# Patient Record
Sex: Female | Born: 1950 | Race: White | Hispanic: No | Marital: Married | Smoking: Never smoker
Health system: Southern US, Community
[De-identification: ages and names within clinical notes are randomized; demographics above are authoritative.]

## PROBLEM LIST (undated history)

## (undated) DIAGNOSIS — E119 Type 2 diabetes mellitus without complications: Secondary | ICD-10-CM

## (undated) DIAGNOSIS — R42 Dizziness and giddiness: Secondary | ICD-10-CM

## (undated) DIAGNOSIS — R519 Headache, unspecified: Secondary | ICD-10-CM

## (undated) DIAGNOSIS — M545 Low back pain, unspecified: Secondary | ICD-10-CM

## (undated) DIAGNOSIS — Z923 Personal history of irradiation: Secondary | ICD-10-CM

## (undated) DIAGNOSIS — Z9889 Other specified postprocedural states: Secondary | ICD-10-CM

## (undated) DIAGNOSIS — R51 Headache: Secondary | ICD-10-CM

## (undated) DIAGNOSIS — E785 Hyperlipidemia, unspecified: Secondary | ICD-10-CM

## (undated) DIAGNOSIS — C50919 Malignant neoplasm of unspecified site of unspecified female breast: Secondary | ICD-10-CM

## (undated) DIAGNOSIS — K219 Gastro-esophageal reflux disease without esophagitis: Secondary | ICD-10-CM

## (undated) DIAGNOSIS — R112 Nausea with vomiting, unspecified: Secondary | ICD-10-CM

## (undated) DIAGNOSIS — I10 Essential (primary) hypertension: Secondary | ICD-10-CM

## (undated) HISTORY — DX: Low back pain: M54.5

## (undated) HISTORY — DX: Low back pain, unspecified: M54.50

## (undated) HISTORY — DX: Dizziness and giddiness: R42

## (undated) HISTORY — DX: Essential (primary) hypertension: I10

## (undated) HISTORY — PX: CATARACT EXTRACTION: SUR2

---

## 2011-10-31 ENCOUNTER — Emergency Department: Payer: Self-pay | Admitting: Unknown Physician Specialty

## 2012-02-05 DIAGNOSIS — I10 Essential (primary) hypertension: Secondary | ICD-10-CM

## 2012-02-05 HISTORY — DX: Essential (primary) hypertension: I10

## 2013-02-04 HISTORY — PX: BREAST LUMPECTOMY: SHX2

## 2013-08-10 ENCOUNTER — Emergency Department (HOSPITAL_COMMUNITY)
Admission: EM | Admit: 2013-08-10 | Discharge: 2013-08-10 | Disposition: A | Discharge status: Home / Self Care | Payer: Self-pay | Attending: Emergency Medicine | Admitting: Emergency Medicine

## 2013-08-10 ENCOUNTER — Emergency Department (HOSPITAL_COMMUNITY): Payer: Self-pay

## 2013-08-10 ENCOUNTER — Encounter (HOSPITAL_COMMUNITY): Payer: Self-pay | Admitting: Emergency Medicine

## 2013-08-10 DIAGNOSIS — N859 Noninflammatory disorder of uterus, unspecified: Secondary | ICD-10-CM | POA: Insufficient documentation

## 2013-08-10 DIAGNOSIS — N949 Unspecified condition associated with female genital organs and menstrual cycle: Secondary | ICD-10-CM

## 2013-08-10 DIAGNOSIS — R1032 Left lower quadrant pain: Secondary | ICD-10-CM

## 2013-08-10 LAB — URINALYSIS, ROUTINE W REFLEX MICROSCOPIC
Bilirubin Urine: NEGATIVE
Glucose, UA: NEGATIVE mg/dL
Hgb urine dipstick: NEGATIVE
Ketones, ur: 40 mg/dL — AB
Nitrite: NEGATIVE
Protein, ur: NEGATIVE mg/dL
Specific Gravity, Urine: 1.023 (ref 1.005–1.030)
Urobilinogen, UA: 1 mg/dL (ref 0.0–1.0)
pH: 6.5 (ref 5.0–8.0)

## 2013-08-10 LAB — COMPREHENSIVE METABOLIC PANEL
ALT: 55 U/L — ABNORMAL HIGH (ref 0–35)
AST: 48 U/L — ABNORMAL HIGH (ref 0–37)
Albumin: 3.8 g/dL (ref 3.5–5.2)
Alkaline Phosphatase: 64 U/L (ref 39–117)
Anion gap: 18 — ABNORMAL HIGH (ref 5–15)
BUN: 16 mg/dL (ref 6–23)
CO2: 26 mEq/L (ref 19–32)
Calcium: 9.4 mg/dL (ref 8.4–10.5)
Chloride: 97 mEq/L (ref 96–112)
Creatinine, Ser: 0.75 mg/dL (ref 0.50–1.10)
GFR calc Af Amer: >90 mL/min (ref >90)
GFR calc non Af Amer: 88 mL/min — ABNORMAL LOW (ref >90)
Glucose, Bld: 179 mg/dL — ABNORMAL HIGH (ref 70–99)
Potassium: 3.8 mEq/L (ref 3.7–5.3)
Sodium: 141 mEq/L (ref 137–147)
Total Bilirubin: 0.4 mg/dL (ref 0.3–1.2)
Total Protein: 8 g/dL (ref 6.0–8.3)

## 2013-08-10 LAB — CBC WITH DIFFERENTIAL/PLATELET
Basophils Absolute: 0 10*3/uL (ref 0.0–0.1)
Basophils Relative: 0 % (ref 0–1)
Eosinophils Absolute: 0.1 10*3/uL (ref 0.0–0.7)
Eosinophils Relative: 1 % (ref 0–5)
HCT: 43.7 % (ref 36.0–46.0)
Hemoglobin: 14.3 g/dL (ref 12.0–15.0)
Lymphocytes Relative: 19 % (ref 12–46)
Lymphs Abs: 1.9 10*3/uL (ref 0.7–4.0)
MCH: 29.3 pg (ref 26.0–34.0)
MCHC: 32.7 g/dL (ref 30.0–36.0)
MCV: 89.5 fL (ref 78.0–100.0)
Monocytes Absolute: 0.5 10*3/uL (ref 0.1–1.0)
Monocytes Relative: 5 % (ref 3–12)
Neutro Abs: 7.7 10*3/uL (ref 1.7–7.7)
Neutrophils Relative %: 75 % (ref 43–77)
Platelets: 267 10*3/uL (ref 150–400)
RBC: 4.88 MIL/uL (ref 3.87–5.11)
RDW: 13.8 % (ref 11.5–15.5)
WBC: 10.3 10*3/uL (ref 4.0–10.5)

## 2013-08-10 LAB — URINE MICROSCOPIC-ADD ON

## 2013-08-10 LAB — LIPASE, BLOOD: Lipase: 33 U/L (ref 11–59)

## 2013-08-10 MED ORDER — MORPHINE SULFATE 4 MG/ML IJ SOLN
4.0000 mg | Freq: Once | INTRAMUSCULAR | Status: AC
  Administered 2013-08-10: 4 mg via INTRAVENOUS
  Filled 2013-08-10: qty 1

## 2013-08-10 MED ORDER — HYDROMORPHONE HCL PF 1 MG/ML IJ SOLN
1.0000 mg | Freq: Once | INTRAMUSCULAR | Status: DC
  Filled 2013-08-10: qty 1

## 2013-08-10 MED ORDER — IOHEXOL 300 MG/ML  SOLN
100.0000 mL | Freq: Once | INTRAMUSCULAR | Status: AC | PRN
  Administered 2013-08-10: 100 mL via INTRAVENOUS

## 2013-08-10 MED ORDER — IOHEXOL 300 MG/ML  SOLN
25.0000 mL | INTRAMUSCULAR | Status: AC
  Administered 2013-08-10: 25 mL via ORAL

## 2013-08-10 MED ORDER — HYDROMORPHONE HCL PF 1 MG/ML IJ SOLN
2.0000 mg | Freq: Once | INTRAMUSCULAR | Status: AC
  Administered 2013-08-10: 2 mg via INTRAVENOUS
  Filled 2013-08-10: qty 2

## 2013-08-10 MED ORDER — ONDANSETRON HCL 4 MG PO TABS: 4.0000 mg | ORAL_TABLET | Freq: Three times a day (TID) | ORAL | Status: DC | PRN

## 2013-08-10 MED ORDER — IBUPROFEN 800 MG PO TABS: 800.0000 mg | ORAL_TABLET | Freq: Three times a day (TID) | ORAL | Status: DC

## 2013-08-10 MED ORDER — ONDANSETRON 4 MG PO TBDP
8.0000 mg | ORAL_TABLET | Freq: Once | ORAL | Status: AC
  Administered 2013-08-10: 8 mg via ORAL
  Filled 2013-08-10: qty 2

## 2013-08-10 MED ORDER — ONDANSETRON HCL 4 MG/2ML IJ SOLN
4.0000 mg | Freq: Once | INTRAMUSCULAR | Status: AC
  Administered 2013-08-10: 4 mg via INTRAVENOUS
  Filled 2013-08-10: qty 2

## 2013-08-10 MED ORDER — SODIUM CHLORIDE 0.9 % IV BOLUS (SEPSIS)
1000.0000 mL | Freq: Once | INTRAVENOUS | Status: AC
  Administered 2013-08-10: 1000 mL via INTRAVENOUS

## 2013-08-10 MED ORDER — OXYCODONE-ACETAMINOPHEN 5-325 MG PO TABS: 1.0000 | ORAL_TABLET | Freq: Four times a day (QID) | ORAL | Status: DC | PRN

## 2013-08-10 NOTE — ED Notes (Signed)
Pt presents to department for evaluation of lower abdominal pain. Onset this morning. 10/10 pain upon arrival to ED. Also states nausea/vomiting. Denies urinary symptoms. Last BM this morning was normal. States she had same pain several weeks ago, but subsided.

## 2013-08-10 NOTE — ED Notes (Signed)
Pt has returned from radiology.  

## 2013-08-10 NOTE — ED Notes (Signed)
Pt states that she would like more pain medication.

## 2013-08-10 NOTE — ED Provider Notes (Signed)
CSN: [id number]     Arrival date & time 08/10/13  1336 History   First MD Initiated Contact with Patient 08/10/13 1523     Chief Complaint  Patient presents with  . Abdominal Pain     (Consider location/radiation/quality/duration/timing/severity/associated sxs/prior Treatment) HPI Comments: Patient is a 63 year old female past medical history significant for reflux presenting to the emergency department for acute onset lower abdominal pain. Patient describes her pain as sharp 10 out of 10 with no alleviating factors. She states the left lower quadrant is the greatest source of pain. She endorses nausea with a few episodes of nonbloody nonbilious emesis earlier today. She states she's had a normal nonbloody non-melanic bowel movement this morning. Denies any fevers, urinary symptoms, vaginal bleeding or discharge, diarrhea or constipation. No abdominal surgical history.  Patient is a 63 y.o. female presenting with abdominal pain.  Abdominal Pain Associated symptoms: chills, nausea and vomiting   Associated symptoms: no chest pain, no constipation, no diarrhea, no fever and no shortness of breath     History reviewed. No pertinent past medical history. History reviewed. No pertinent past surgical history. No family history on file. History  Substance Use Topics  . Smoking status: Never Smoker   . Smokeless tobacco: Not on file  . Alcohol Use: No   OB History   Grav Para Term Preterm Abortions TAB SAB Ect Mult Living                 Review of Systems  Constitutional: Positive for chills. Negative for fever.  Respiratory: Negative for shortness of breath.   Cardiovascular: Negative for chest pain.  Gastrointestinal: Positive for nausea, vomiting and abdominal pain. Negative for diarrhea, constipation, blood in stool and anal bleeding.  All other systems reviewed and are negative.     Allergies  Review of patient's allergies indicates no known allergies.  Home Medications    Prior to Admission medications   Medication Sig Start Date End Date Taking? Authorizing Provider  ibuprofen (ADVIL,MOTRIN) 800 MG tablet Take 1 tablet (800 mg total) by mouth 3 (three) times daily. 08/10/13   TRUE Shackleford L Samarah Hogle, PA-C  ondansetron (ZOFRAN) 4 MG tablet Take 1 tablet (4 mg total) by mouth every 8 (eight) hours as needed for nausea or vomiting. 08/10/13   Angle Dirusso L Cierrah Dace, PA-C  oxyCODONE-acetaminophen (PERCOCET/ROXICET) 5-325 MG per tablet Take 1-2 tablets by mouth every 6 (six) hours as needed for severe pain. May take 2 tablets PO q 6 hours for severe pain - Do not take with Tylenol as this tablet already contains tylenol 08/10/13   Zuri Bradway L Zavian Slowey, PA-C   BP 124/64  Pulse 71  Temp(Src) 97.4 F (36.3 C) (Oral)  Resp 13  Ht 5\' 7"  (1.702 m)  Wt 215 lb (97.523 kg)  BMI 33.67 kg/m2  SpO2 95% Physical Exam  Nursing note and vitals reviewed. Constitutional: She is oriented to person, place, and time. She appears well-developed and well-nourished.  HENT:  Head: Normocephalic and atraumatic.  Right Ear: External ear normal.  Left Ear: External ear normal.  Nose: Nose normal.  Mouth/Throat: Oropharynx is clear and moist. No oropharyngeal exudate.  Eyes: Conjunctivae are normal.  Neck: Neck supple.  Cardiovascular: Normal rate, regular rhythm, normal heart sounds and intact distal pulses.   Pulses:      Dorsalis pedis pulses are 2+ on the right side, and 2+ on the left side.       Posterior tibial pulses are 2+ on the right side, and  2+ on the left side.  Pulmonary/Chest: Effort normal and breath sounds normal.  Abdominal: Soft. Bowel sounds are normal. She exhibits no distension. There is tenderness in the right lower quadrant, suprapubic area and left lower quadrant. There is no rigidity, no rebound and no guarding.  Musculoskeletal: Normal range of motion. She exhibits no edema.  Neurological: She is alert and oriented to person, place, and time.  Skin: Skin  is warm and dry.    ED Course  Procedures (including critical care time) Medications  sodium chloride 0.9 % bolus 1,000 mL (0 mLs Intravenous Stopped 08/10/13 2113)  ondansetron (ZOFRAN) injection 4 mg (4 mg Intravenous Given 08/10/13 1819)  morphine 4 MG/ML injection 4 mg (4 mg Intravenous Given 08/10/13 1822)  iohexol (OMNIPAQUE) 300 MG/ML solution 25 mL (25 mLs Oral Contrast Given 08/10/13 1648)  morphine 4 MG/ML injection 4 mg (4 mg Intravenous Given 08/10/13 1943)  iohexol (OMNIPAQUE) 300 MG/ML solution 100 mL (100 mLs Intravenous Contrast Given 08/10/13 1849)  HYDROmorphone (DILAUDID) injection 2 mg (2 mg Intravenous Given 08/10/13 2119)  ondansetron (ZOFRAN-ODT) disintegrating tablet 8 mg (8 mg Oral Given 08/10/13 2223)    Labs Review Labs Reviewed  COMPREHENSIVE METABOLIC PANEL - Abnormal; Notable for the following:    Glucose, Bld 179 (*)    AST 48 (*)    ALT 55 (*)    GFR calc non Af Amer 88 (*)    Anion gap 18 (*)    All other components within normal limits  URINALYSIS, ROUTINE W REFLEX MICROSCOPIC - Abnormal; Notable for the following:    Ketones, ur 40 (*)    Leukocytes, UA SMALL (*)    All other components within normal limits  URINE MICROSCOPIC-ADD ON - Abnormal; Notable for the following:    Squamous Epithelial / LPF FEW (*)    Bacteria, UA FEW (*)    All other components within normal limits  CBC WITH DIFFERENTIAL  LIPASE, BLOOD  CA 125    Imaging Review Ct Abdomen Pelvis W Contrast  08/10/2013   CLINICAL DATA:  Left mid and lower quadrant abdominal pain.  EXAM: CT ABDOMEN AND PELVIS WITH CONTRAST  TECHNIQUE: Multidetector CT imaging of the abdomen and pelvis was performed using the standard protocol following bolus administration of intravenous contrast.  CONTRAST:  192mL OMNIPAQUE IOHEXOL 300 MG/ML  SOLN  COMPARISON:  None.  FINDINGS: Diffuse hepatic steatosis is demonstrated, but no liver masses are identified. The gallbladder, pancreas, spleen, adrenal glands, and kidneys  are normal in appearance. No evidence hydronephrosis.  A simple appearing cystic lesion is seen arising from the left adnexa which measures 11.4 x 11.6 cm. 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 No evidence of inflammatory changes or ascites diverticulosis is seen predominately involving the left colon, however there is no evidence of diverticulitis. No evidence of bowel obstruction.  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 No CT signs of torsion or other complication. GYN consultation and surgical evaluation is recommended.  Diverticulosis. No radiographic evidence of diverticulitis.  Hepatic steatosis.  These results were called by telephone at the time of interpretation on 08/10/2013 at 7:43 PM to Dr. Wyvonnia Dusky, who verbally acknowledged these results.   Electronically Signed   By: Earle Gell M.D.   On: 08/10/2013 19:44     EKG Interpretation None      MDM   Final diagnoses:  Adnexal cyst  Left lower quadrant pain  Filed Vitals:   08/10/13 2203  BP: 124/64  Pulse: 71  Temp:   Resp: 13   8:08 PM [facility] consulted.   8:43 PM Dr. Olevia Bowens from [facility] consulted. She has reviewed chart and imaging and recommends a Ca 125 be drawn and she will have her office order an outpatient ultrasound. She will have the office call the patient to follow up as an outpatient.   Afebrile, NAD, non-toxic appearing, AAOx4.  Patient presenting with acute onset left lower quadrant abdominal pain with nausea and vomiting. Abdomen is soft, tender in lower quadrants, no peritoneal signs. I have reviewed nursing notes, vital signs, and all appropriate lab and imaging results for this patient. CT scan reveals 11.6 cm simple cyst on left adnexa, no evidence of torsion on CT scan, no peritoneal signs to suggest clinical evidence of torsion. Discussed patient with Dr. Olevia Bowens who will schedule  an outpatient ultrasound, requests a CA 125 be drawn in the emergency department, and will call patient for followup appointment her office. Return precautions discussed. Patient is agreeable to plan. Pain and symptoms managed in the emergency department prior to discharge. Patient is stable at time of discharge. Patient d/w with Dr. Wyvonnia Dusky, agrees with plan.        Harlow Mares, PA-C 08/10/13 2237

## 2013-08-10 NOTE — ED Notes (Signed)
Rancour, MD at bedside. 

## 2013-08-10 NOTE — ED Notes (Signed)
Piepenbrink, PA at bedside.

## 2013-08-10 NOTE — Discharge Instructions (Signed)
Please follow up with Dr. Roseanne Kaufman office to get your scheduled follow up appointment, along with your ultrasound time. If you develop worsening abdominal pain or any other concerning signs or symptoms please go to the [facility] Emergency department immediately. Please take pain medication and/or muscle relaxants as prescribed and as needed for pain. Please do not drive on narcotic pain medication or on muscle relaxants. Please read all discharge instructions and return precautions.   Ovarian Cyst An ovarian cyst is a fluid-filled sac that forms on an ovary. The ovaries are small organs that produce eggs in women. Various types of cysts can form on the ovaries. Most are not cancerous. Many do not cause problems, and they often go away on their own. Some may cause symptoms and require treatment. Common types of ovarian cysts include:  Functional cysts--These cysts may occur every month during the menstrual cycle. This is normal. The cysts usually go away with the next menstrual cycle if the woman does not get pregnant. Usually, there are no symptoms with a functional cyst.  Endometrioma cysts--These cysts form from the tissue that lines the uterus. They are also called "chocolate cysts" because they become filled with blood that turns brown. This type of cyst can cause pain in the lower abdomen during intercourse and with your menstrual period.  Cystadenoma cysts--This type develops from the cells on the outside of the ovary. These cysts can get very big and cause lower abdomen pain and pain with intercourse. This type of cyst can twist on itself, cut off its blood supply, and cause severe pain. It can also easily rupture and cause a lot of pain.  Dermoid cysts--This type of cyst is sometimes found in both ovaries. These cysts may contain different kinds of body tissue, such as skin, teeth, hair, or cartilage. They usually do not cause symptoms unless they get very big.  Theca lutein cysts--These  cysts occur when too much of a certain hormone (human chorionic gonadotropin) is produced and overstimulates the ovaries to produce an egg. This is most common after procedures used to assist with the conception of a baby (in vitro fertilization). CAUSES   Fertility drugs can cause a condition in which multiple large cysts are formed on the ovaries. This is called ovarian hyperstimulation syndrome.  A condition called polycystic ovary syndrome can cause hormonal imbalances that can lead to nonfunctional ovarian cysts. SIGNS AND SYMPTOMS  Many ovarian cysts do not cause symptoms. If symptoms are present, they may include:  Pelvic pain or pressure.  Pain in the lower abdomen.  Pain during sexual intercourse.  Increasing girth (swelling) of the abdomen.  Abnormal menstrual periods.  Increasing pain with menstrual periods.  Stopping having menstrual periods without being pregnant. DIAGNOSIS  These cysts are commonly found during a routine or annual pelvic exam. Tests may be ordered to find out more about the cyst. These tests may include:  Ultrasound.  X-ray of the pelvis.  CT scan.  MRI.  Blood tests. TREATMENT  Many ovarian cysts go away on their own without treatment. Your health care provider may want to check your cyst regularly for 2-3 months to see if it changes. For women in menopause, it is particularly important to monitor a cyst closely because of the higher rate of ovarian cancer in menopausal women. When treatment is needed, it may include any of the following:  A procedure to drain the cyst (aspiration). This may be done using a long needle and ultrasound. It can also  be done through a laparoscopic procedure. This involves using a thin, lighted tube with a tiny camera on the end (laparoscope) inserted through a small incision.  Surgery to remove the whole cyst. This may be done using laparoscopic surgery or an open surgery involving a larger incision in the lower  abdomen.  Hormone treatment or birth control pills. These methods are sometimes used to help dissolve a cyst. HOME CARE INSTRUCTIONS   Only take over-the-counter or prescription medicines as directed by your health care provider.  Follow up with your health care provider as directed.  Get regular pelvic exams and Pap tests. SEEK MEDICAL CARE IF:   Your periods are late, irregular, or painful, or they stop.  Your pelvic pain or abdominal pain does not go away.  Your abdomen becomes larger or swollen.  You have pressure on your bladder or trouble emptying your bladder completely.  You have pain during sexual intercourse.  You have feelings of fullness, pressure, or discomfort in your stomach.  You lose weight for no apparent reason.  You feel generally ill.  You become constipated.  You lose your appetite.  You develop acne.  You have an increase in body and facial hair.  You are gaining weight, without changing your exercise and eating habits.  You think you are pregnant. SEEK IMMEDIATE MEDICAL CARE IF:   You have increasing abdominal pain.  You feel sick to your stomach (nauseous), and you throw up (vomit).  You develop a fever that comes on suddenly.  You have abdominal pain during a bowel movement.  Your menstrual periods become heavier than usual. MAKE SURE YOU:  Understand these instructions.  Will watch your condition.  Will get help right away if you are not doing well or get worse. Document Released: 01/21/2005 Document Revised: 01/26/2013 Document Reviewed: 09/28/2012 [facility] Patient Information 2015 [place], [place]. This information is not intended to replace advice given to you by your health care provider. Make sure you discuss any questions you have with your health care provider. Abdominal Pain, Women Abdominal (stomach, pelvic, or belly) pain can be caused by many things. It is important to tell your doctor:  The location of the  pain.  Does it come and go or is it present all the time?  Are there things that start the pain (eating certain foods, exercise)?  Are there other symptoms associated with the pain (fever, nausea, vomiting, diarrhea)? All of this is helpful to know when trying to find the cause of the pain. CAUSES   Stomach: virus or bacteria infection, or ulcer.  Intestine: appendicitis (inflamed appendix), regional ileitis (Crohn's disease), ulcerative colitis (inflamed colon), irritable bowel syndrome, diverticulitis (inflamed diverticulum of the colon), or cancer of the stomach or intestine.  Gallbladder disease or stones in the gallbladder.  Kidney disease, kidney stones, or infection.  Pancreas infection or cancer.  Fibromyalgia (pain disorder).  Diseases of the female organs:  Uterus: fibroid (non-cancerous) tumors or infection.  Fallopian tubes: infection or tubal pregnancy.  Ovary: cysts or tumors.  Pelvic adhesions (scar tissue).  Endometriosis (uterus lining tissue growing in the pelvis and on the pelvic organs).  Pelvic congestion syndrome (female organs filling up with blood just before the menstrual period).  Pain with the menstrual period.  Pain with ovulation (producing an egg).  Pain with an IUD (intrauterine device, birth control) in the uterus.  Cancer of the female organs.  Functional pain (pain not caused by a disease, may improve without treatment).  Psychological pain.  Depression. DIAGNOSIS  Your doctor will decide the seriousness of your pain by doing an examination.  Blood tests.  X-rays.  Ultrasound.  CT scan (computed tomography, special type of X-ray).  MRI (magnetic resonance imaging).  Cultures, for infection.  Barium enema (dye inserted in the large intestine, to better view it with X-rays).  Colonoscopy (looking in intestine with a lighted tube).  Laparoscopy (minor surgery, looking in abdomen with a lighted tube).  Major abdominal  exploratory surgery (looking in abdomen with a large incision). TREATMENT  The treatment will depend on the cause of the pain.   Many cases can be observed and treated at home.  Over-the-counter medicines recommended by your caregiver.  Prescription medicine.  Antibiotics, for infection.  Birth control pills, for painful periods or for ovulation pain.  Hormone treatment, for endometriosis.  Nerve blocking injections.  Physical therapy.  Antidepressants.  Counseling with a psychologist or psychiatrist.  Minor or major surgery. HOME CARE INSTRUCTIONS   Do not take laxatives, unless directed by your caregiver.  Take over-the-counter pain medicine only if ordered by your caregiver. Do not take aspirin because it can cause an upset stomach or bleeding.  Try a clear liquid diet (broth or water) as ordered by your caregiver. Slowly move to a bland diet, as tolerated, if the pain is related to the stomach or intestine.  Have a thermometer and take your temperature several times a day, and record it.  Bed rest and sleep, if it helps the pain.  Avoid sexual intercourse, if it causes pain.  Avoid stressful situations.  Keep your follow-up appointments and tests, as your caregiver orders.  If the pain does not go away with medicine or surgery, you may try:  Acupuncture.  Relaxation exercises (yoga, meditation).  Group therapy.  Counseling. SEEK MEDICAL CARE IF:   You notice certain foods cause stomach pain.  Your home care treatment is not helping your pain.  You need stronger pain medicine.  You want your IUD removed.  You feel faint or lightheaded.  You develop nausea and vomiting.  You develop a rash.  You are having side effects or an allergy to your medicine. SEEK IMMEDIATE MEDICAL CARE IF:   Your pain does not go away or gets worse.  You have a fever.  Your pain is felt only in portions of the abdomen. The right side could possibly be appendicitis.  The left lower portion of the abdomen could be colitis or diverticulitis.  You are passing blood in your stools (bright red or black tarry stools, with or without vomiting).  You have blood in your urine.  You develop chills, with or without a fever.  You pass out. MAKE SURE YOU:   Understand these instructions.  Will watch your condition.  Will get help right away if you are not doing well or get worse. Document Released: 11/18/2006 Document Revised: 04/15/2011 Document Reviewed: 12/08/2008 [facility] Patient Information 2015 [place], [place]. This information is not intended to replace advice given to you by your health care provider. Make sure you discuss any questions you have with your health care provider.

## 2013-08-11 ENCOUNTER — Telehealth: Payer: Self-pay

## 2013-08-11 DIAGNOSIS — N83209 Unspecified ovarian cyst, unspecified side: Secondary | ICD-10-CM

## 2013-08-11 LAB — CA 125: CA 125: 15.3 U/mL (ref 0.0–30.2)

## 2013-08-11 NOTE — Telephone Encounter (Signed)
Message copied by Geanie Logan on Wed Aug 11, 2013  8:22 AM ------      Message from: Kassie Mends      Created: Tue Aug 10, 2013  8:42 PM       Pt needs an out patient [place] this week to follow up an ovarian cyst found in the ED.  There is a CA 125 pending.  She needs an appointment early next week (sorry, I know we are slammed:) ------

## 2013-08-11 NOTE — Telephone Encounter (Signed)
Earliest possible [place] appointment Tuesday 08/17/13 at 1:45pm. Message sent to admin in basket to schedule patient for appointment next week after [place] completed. Called patient and informed of ultrasound appointment date, time and location as well as directions not to void an hour prior to exam. Informed her she will hear from our front office staff with a clinic appointment for sometime next week. Patient verbalized understanding. No questions or concerns.

## 2013-08-11 NOTE — ED Provider Notes (Signed)
Medical screening examination/treatment/procedure(s) were conducted as a shared visit with non-physician practitioner(s) and myself.  I personally evaluated the patient during the encounter.  L sided abdominal pain that onset today and is constant. TTP LLQ with no guarding or rebound. D/w Dr. Kris Hartmann who does not recommend [place], no evidence of torsion on CT. He states US unlikely to add morre information.   EKG Interpretation None       Ezequiel Essex, MD 08/11/13 [phone]

## 2013-08-17 ENCOUNTER — Ambulatory Visit (HOSPITAL_COMMUNITY)
Admission: RE | Admit: 2013-08-17 | Discharge: 2013-08-17 | Disposition: A | Discharge status: Home / Self Care | Payer: Self-pay | Source: Ambulatory Visit | Attending: Family Medicine | Admitting: Family Medicine

## 2013-08-17 DIAGNOSIS — N83209 Unspecified ovarian cyst, unspecified side: Secondary | ICD-10-CM | POA: Insufficient documentation

## 2013-08-17 DIAGNOSIS — N839 Noninflammatory disorder of ovary, fallopian tube and broad ligament, unspecified: Secondary | ICD-10-CM | POA: Insufficient documentation

## 2013-08-17 DIAGNOSIS — N949 Unspecified condition associated with female genital organs and menstrual cycle: Secondary | ICD-10-CM | POA: Insufficient documentation

## 2013-08-17 DIAGNOSIS — R9389 Abnormal findings on diagnostic imaging of other specified body structures: Secondary | ICD-10-CM | POA: Insufficient documentation

## 2013-08-18 ENCOUNTER — Ambulatory Visit (INDEPENDENT_AMBULATORY_CARE_PROVIDER_SITE_OTHER): Payer: Self-pay | Admitting: Obstetrics & Gynecology

## 2013-08-18 ENCOUNTER — Encounter: Payer: Self-pay | Admitting: Obstetrics & Gynecology

## 2013-08-18 VITALS — BP 195/104 | HR 74 | Temp 97.1°F | Ht 66.0 in | Wt 263.4 lb

## 2013-08-18 DIAGNOSIS — N950 Postmenopausal bleeding: Secondary | ICD-10-CM | POA: Insufficient documentation

## 2013-08-18 DIAGNOSIS — N83209 Unspecified ovarian cyst, unspecified side: Secondary | ICD-10-CM

## 2013-08-18 MED ORDER — IBUPROFEN 800 MG PO TABS: 800.0000 mg | ORAL_TABLET | Freq: Three times a day (TID) | ORAL | Status: DC

## 2013-08-18 NOTE — Patient Instructions (Signed)
Postmenopausal Bleeding  Postmenopausal bleeding is any bleeding a woman has after she has entered into menopause. Menopause is the end of a woman's fertile years. After menopause, a woman no longer ovulates or has menstrual periods.   Postmenopausal bleeding can be caused by various things. Any type of postmenopausal bleeding, even if it appears to be a typical menstrual period, is concerning. This should be evaluated by your health care provider. Any treatment will depend on the cause of the bleeding.  HOME CARE INSTRUCTIONS  Monitor your condition for any changes. The following actions may help to alleviate any discomfort you are experiencing:  · Avoid the use of tampons and douches as directed by your health care provider.   · Change your pads frequently.  · Get regular pelvic exams and Pap tests.  · Keep all follow-up appointments for diagnostic tests as directed by your health care provider.  SEEK MEDICAL CARE IF:   · Your bleeding lasts more than 1 week.  · You have abdominal pain.  · You have bleeding with sexual intercourse.  SEEK IMMEDIATE MEDICAL CARE IF:   · You have a fever, chills, headache, dizziness, muscle aches, and bleeding.  · You have severe pain with bleeding.  · You are passing blood clots.  · You have bleeding and need more than 1 pad an hour.  · You feel faint.  MAKE SURE YOU:  · Understand these instructions.  · Will watch your condition.  · Will get help right away if you are not doing well or get worse.  Document Released: 05/01/2005 Document Revised: 11/11/2012 Document Reviewed: 08/20/2012  [employer]® Patient Information ©2015 [employer]. This information is not intended to replace advice given to you by your health care provider. Make sure you discuss any questions you have with your health care provider.

## 2013-08-18 NOTE — Progress Notes (Signed)
Patient ID: Rachel Herman, female   DOB: 29-May-1950, 63 y.o.   MRN: [id number]  Chief Complaint  Patient presents with  . Ovarian Cyst    HPI Rachel Herman is a 63 y.o. female.  [id number] No LMP recorded. Patient is postmenopausal. LLQ pain for 8 days, left ovarian cyst seen on CT and [place] 08/10/13 on ED visit, still some pain, now with VB for 2 days. Menopause age 55. NO PCP HPI  Past Medical History  Diagnosis Date  . Hypertension     No past surgical history on file.  No family history on file.  Social History History  Substance Use Topics  . Smoking status: Never Smoker   . Smokeless tobacco: Not on file  . Alcohol Use: No    No Known Allergies  Current Outpatient Prescriptions  Medication Sig Dispense Refill  . ibuprofen (ADVIL,MOTRIN) 800 MG tablet Take 1 tablet (800 mg total) by mouth 3 (three) times daily.  21 tablet  0  . ondansetron (ZOFRAN) 4 MG tablet Take 1 tablet (4 mg total) by mouth every 8 (eight) hours as needed for nausea or vomiting.  10 tablet  0  . oxyCODONE-acetaminophen (PERCOCET/ROXICET) 5-325 MG per tablet Take 1-2 tablets by mouth every 6 (six) hours as needed for severe pain. May take 2 tablets PO q 6 hours for severe pain - Do not take with Tylenol as this tablet already contains tylenol  30 tablet  0   No current facility-administered medications for this visit.    Review of Systems Review of Systems  Constitutional: Negative.   Gastrointestinal: Positive for abdominal pain and abdominal distention. Negative for constipation and blood in stool.  Genitourinary: Positive for vaginal bleeding. Negative for dysuria, hematuria and vaginal discharge.    Blood pressure 195/104, pulse 74, temperature 97.1 F (36.2 C), temperature source Oral, height 5\' 6"  (1.676 m), weight 263 lb 6.4 oz (119.477 kg).  Physical Exam Physical Exam  Vitals reviewed. Constitutional: She is oriented to person, place, and time. No distress.  Abdominal: Soft.  She exhibits distension. She exhibits no mass. There is tenderness (mild LLQ).  Genitourinary: Uterus normal. Vaginal discharge (blood) found.  Neurological: She is alert and oriented to person, place, and time.  Skin: Skin is warm and dry.  Psychiatric: She has a normal mood and affect. Her behavior is normal.    Data Reviewed CT and [place] and ED note  Assessment    Simple ovarian cyst, PMP bleeding     Plan    Schedule EMB, pap sent, take pain med pre-procedure        ARNOLD,JAMES 08/18/2013, 2:41 PM

## 2013-08-19 LAB — CYTOLOGY - PAP

## 2013-09-13 ENCOUNTER — Other Ambulatory Visit (HOSPITAL_COMMUNITY)
Admission: RE | Admit: 2013-09-13 | Discharge: 2013-09-13 | Disposition: A | Discharge status: Home / Self Care | Payer: No Typology Code available for payment source | Source: Ambulatory Visit | Attending: Obstetrics & Gynecology | Admitting: Obstetrics & Gynecology

## 2013-09-13 ENCOUNTER — Encounter: Payer: Self-pay | Admitting: Obstetrics & Gynecology

## 2013-09-13 ENCOUNTER — Ambulatory Visit (INDEPENDENT_AMBULATORY_CARE_PROVIDER_SITE_OTHER): Payer: No Typology Code available for payment source | Admitting: Obstetrics & Gynecology

## 2013-09-13 VITALS — BP 126/82 | HR 72 | Temp 97.7°F | Ht 67.0 in | Wt 265.0 lb

## 2013-09-13 DIAGNOSIS — R9389 Abnormal findings on diagnostic imaging of other specified body structures: Secondary | ICD-10-CM

## 2013-09-13 DIAGNOSIS — N950 Postmenopausal bleeding: Secondary | ICD-10-CM

## 2013-09-13 DIAGNOSIS — N879 Dysplasia of cervix uteri, unspecified: Secondary | ICD-10-CM | POA: Insufficient documentation

## 2013-09-13 DIAGNOSIS — N8502 Endometrial intraepithelial neoplasia [EIN]: Secondary | ICD-10-CM | POA: Diagnosis present

## 2013-09-13 DIAGNOSIS — Z01818 Encounter for other preprocedural examination: Secondary | ICD-10-CM

## 2013-09-13 DIAGNOSIS — N83209 Unspecified ovarian cyst, unspecified side: Secondary | ICD-10-CM

## 2013-09-13 MED ORDER — IBUPROFEN 800 MG PO TABS: 800.0000 mg | ORAL_TABLET | Freq: Three times a day (TID) | ORAL | Status: DC

## 2013-09-13 MED ORDER — OXYCODONE-ACETAMINOPHEN 5-325 MG PO TABS: 1.0000 | ORAL_TABLET | Freq: Four times a day (QID) | ORAL | Status: DC | PRN

## 2013-09-13 NOTE — Progress Notes (Signed)
CLINIC ENCOUNTER NOTE  History:  63 y.o. [id number] here today for evaluation of thickened endometrium seen on ultrasound done for evaluation of PMB.  Of note, pelvic ultrasound also showed 13 cm simple appearing left ovarian cyst. Was seen on 08/18/13, had pap smear done which was neagtive. Recent CA -125 was 15.3.  Patient is here today for endometrial biopsy and further management. She reports some continued spotting and pelvic pain; desires refill of her Percocet and Ibuprofen.  The following portions of the patient's history were reviewed and updated as appropriate: allergies, current medications, past family history, past medical history, past social history, past surgical history and problem list.  Review of Systems:  Pertinent items are noted in HPI.  Objective:  Physical Exam BP 126/82  Pulse 72  Temp(Src) 97.7 F (36.5 C) (Oral)  Ht 5\' 7"  (1.702 m)  Wt 265 lb (120.203 kg)  BMI 41.50 kg/m2 Gen: NAD Abd: Soft, obese, mild pelvic tenderness to palpation Pelvic: Normal appearing external genitalia; normal appearing vaginal mucosa and cervix.  Normal discharge.  Small uterus, no other palpable masses, no uterine or adnexal tenderness   ENDOMETRIAL BIOPSY     The indications for endometrial biopsy were reviewed.   Risks of the biopsy including cramping, bleeding, infection, uterine perforation, inadequate specimen and need for additional procedures  were discussed. The patient states she understands and agrees to undergo procedure today. Consent was signed. Time out was performed. Urine HCG was negative. During the pelvic exam, the cervix was prepped with Betadine. A single-toothed tenaculum was placed on the anterior lip of the cervix to stabilize it.  The 3 mm pipelle was introduced into the endometrial cavity, after some dilation with the plastic dilators, to a depth of 9 cm, and a moderate amount of tissue was obtained after two passes and sent to pathology. The instruments were  removed from the patient's vagina. Minimal bleeding from the cervix was noted. The patient tolerated the procedure well. Routine post-procedure instructions were given to the patient.    Labs and Imaging 08/10/2013   CA 125 15.3  08/18/2013 Normal pap, negative HRHPV  08/17/2013   TRANSABDOMINAL AND TRANSVAGINAL ULTRASOUND OF PELVIS  CLINICAL DATA:  Pelvic pain, left greater than right, follow-up ovarian cyst on CT TECHNIQUE: Both transabdominal and transvaginal ultrasound examinations of the pelvis were performed. Transabdominal technique was performed for global imaging of the pelvis including uterus, ovaries, adnexal regions, and pelvic cul-de-sac. It was necessary to proceed with endovaginal exam following the transabdominal exam to visualize the endometrium and bilateral adnexa.  COMPARISON:  CT abdomen pelvis dated 08/10/2013  FINDINGS: Uterus  Measurements: 8.5 x 4.2 x 5.5 cm. No fibroids or other mass visualized.  Endometrium  Thickened endometrium, measuring 19 mm, with scattered cystic endometrial foci.  Right ovary  Not discretely visualized transabdominally or transvaginally.  Left ovary  Not discretely visualized transabdominally or transvaginally.  Other findings  13.0 x 9.6 x 12.0 cm simple appearing cystic lesion in the pelvis, to the right of midline, without associated septations, soft tissue mass, or mural nodularity. No color Doppler flow.  When correlating with prior CT, this likely arises from the left ovary.  IMPRESSION: 13.0 cm cystic adnexal mass, simple. When correlating with prior CT, this likely arises from the left ovary. No overt malignant features. However, given size, surgical evaluation is suggested.  If further imaging is desired, consider MR pelvis with/without contrast.   Electronically Signed   By: Julian Hy M.D.   On:  08/17/2013 14:45   Assessment & Plan:  Patient desires surgical management with laparoscopic left salpingoophorectomy.  The risks of surgery were  discussed in detail with the patient including but not limited to: bleeding which may require transfusion or reoperation; infection which may require prolonged hospitalization or re-hospitalization and antibiotic therapy; injury to bowel, bladder, ureters and major vessels or other surrounding organs; need for additional procedures including laparotomy; thromboembolic phenomenon, incisional problems and other postoperative or anesthesia complications.  Patient was told that the likelihood that her condition and symptoms will be treated effectively with this surgical management was very high; the postoperative expectations were also discussed in detail. The patient also understands the alternative treatment options which were discussed in full. All questions were answered.  She was told that she will be contacted by our surgical scheduler regarding the time and date of her surgery; routine preoperative instructions of having nothing to eat or drink after midnight on the day prior to surgery and also coming to the hospital 1.5 hours prior to her time of surgery were also emphasized.  She was told she may be called for a preoperative appointment about a week prior to surgery and will be given further preoperative instructions at that visit. Printed patient education handouts about the procedure were given to the patient to review at home.  Of note, pain medications (percocet and Ibuprofen) refilled for patient, bleeding and pain precautions reviewed.  Patient also understands that surgery may be modified depending on pathology results from the endometrial biopsy; will follow up results and manage accordingly.     Verita Schneiders, MD, [place] Attending [place] for [employer], [place]

## 2013-09-13 NOTE — Patient Instructions (Signed)
Ovarian Cystectomy Ovarian cystectomy is surgery to remove a fluid-filled sac (cyst) on an ovary. The ovaries are small organs that produce eggs in women. Various types of cysts can form on the ovaries. Most are not cancerous. Surgery may be done if a cyst is large or is causing symptoms such as pain. It may also be done for a cyst that is or might be cancerous. This surgery can be done using a laparoscopic technique or an open abdominal technique. The laparoscopic technique involves smaller cuts (incisions) and a faster recovery time. The technique used will depend on your age, the type of cyst, and whether the cyst is cancerous. The laparoscopic technique is not used for a cancerous cyst. LET YOUR HEALTH CARE PROVIDER KNOW ABOUT:   Any allergies you have.  All medicines you are taking, including vitamins, herbs, eye drops, creams, and over-the-counter medicines.  Previous problems you or members of your family have had with the use of anesthetics.  Any blood disorders you have.  Previous surgeries you have had.  Medical conditions you have.  Any chance you might be pregnant. RISKS AND COMPLICATIONS Generally, this is a safe procedure. However, as with any procedure, complications can occur. Possible complications include:  Excessive bleeding.  Infection.  Injury to other organs.  Blood clots.  Becoming incapable of getting pregnant (infertile). BEFORE THE PROCEDURE  Ask your health care provider about changing or stopping any regular medicines. Avoid taking aspirin, ibuprofen, or blood thinners as directed by your health care provider.  Do not eat or drink anything after midnight the night before surgery.  If you smoke, do not smoke for at least 2 weeks before your surgery.  Do not drink alcohol the day before your surgery.  Let your health care provider know if you develop a cold or any infection before your surgery.  Arrange for someone to drive you home after the  procedure or after your hospital stay. Also arrange for someone to help you with activities during recovery. PROCEDURE  Either a laparoscopic technique or an open abdominal technique may be used for this surgery.  Small monitors will be put on your body. They are used to check your heart, blood pressure, and oxygen level.   An IV access tube will be put into one of your veins. Medicine will be able to flow directly into your body through this IV tube.   You might be given a medicine to help you relax (sedative).   You will be given a medicine to make you sleep (general anesthetic). A breathing tube may be placed into your lungs during the procedure. Laparoscopic Technique  Several small cuts (incisions) are made in your abdomen. These are typically about 1 to 2 cm long.   Your abdomen will be filled with carbon dioxide gas so that it expands. This gives the surgeon more room to operate and makes your organs easier to see.   A thin, lighted tube with a tiny camera on the end (laparoscope) is put through one of the small incisions. The camera on the laparoscope sends a picture to a TV screen in the operating room. This gives the surgeon a good view inside your abdomen.   Hollow tubes are put through the other small incisions in your abdomen. The tools needed for the procedure are put through these tubes.  The ovary with the cyst is identified, and the cyst is removed. It is sent to the lab for testing. If it is cancer, both ovaries   may need to be removed during a different surgery.  Tools are removed. The incisions are then closed with stitches or skin glue, and dressings may be applied. Open Abdominal Technique  A single large incision is made along your bikini line or in the middle of your lower abdomen.  The ovary with the cyst is identified, and the cyst is removed. It is sent to the lab for testing. If it is cancer, both ovaries may need to be removed during a different  surgery.  The incision is then closed with stitches or staples. AFTER THE PROCEDURE   You will wake up from anesthesia and be taken to a recovery area.  If you had laparoscopic surgery, you may be able to go home the same day, or you may need to stay in the hospital overnight.  If you had open abdominal surgery, you will need to stay in the hospital for a few days.  Your IV access tube and catheter will be removed the first or second day, after you are able to eat and drink enough.  You may be given medicine to relieve pain or to help you sleep.  You may be given an antibiotic medicine if needed. Document Released: 11/18/2006 Document Revised: 11/11/2012 Document Reviewed: 09/02/2012 ExitCare Patient Information 2015 [employer]. This information is not intended to replace advice given to you by your health care provider. Make sure you discuss any questions you have with your health care provider.  

## 2013-09-16 ENCOUNTER — Inpatient Hospital Stay (HOSPITAL_COMMUNITY): Payer: No Typology Code available for payment source

## 2013-09-16 ENCOUNTER — Inpatient Hospital Stay (HOSPITAL_COMMUNITY)
Admission: AD | Admit: 2013-09-16 | Discharge: 2013-09-16 | Disposition: A | Discharge status: Home / Self Care | Payer: No Typology Code available for payment source | Source: Ambulatory Visit | Attending: Obstetrics & Gynecology | Admitting: Obstetrics & Gynecology

## 2013-09-16 ENCOUNTER — Encounter (HOSPITAL_COMMUNITY): Payer: Self-pay | Admitting: *Deleted

## 2013-09-16 DIAGNOSIS — R1032 Left lower quadrant pain: Secondary | ICD-10-CM | POA: Insufficient documentation

## 2013-09-16 DIAGNOSIS — Z780 Asymptomatic menopausal state: Secondary | ICD-10-CM | POA: Insufficient documentation

## 2013-09-16 DIAGNOSIS — N8502 Endometrial intraepithelial neoplasia [EIN]: Secondary | ICD-10-CM | POA: Diagnosis present

## 2013-09-16 DIAGNOSIS — I10 Essential (primary) hypertension: Secondary | ICD-10-CM | POA: Insufficient documentation

## 2013-09-16 DIAGNOSIS — R102 Pelvic and perineal pain: Secondary | ICD-10-CM

## 2013-09-16 DIAGNOSIS — E119 Type 2 diabetes mellitus without complications: Secondary | ICD-10-CM | POA: Insufficient documentation

## 2013-09-16 DIAGNOSIS — N949 Unspecified condition associated with female genital organs and menstrual cycle: Secondary | ICD-10-CM | POA: Insufficient documentation

## 2013-09-16 DIAGNOSIS — N950 Postmenopausal bleeding: Secondary | ICD-10-CM

## 2013-09-16 DIAGNOSIS — N8501 Benign endometrial hyperplasia: Secondary | ICD-10-CM | POA: Insufficient documentation

## 2013-09-16 DIAGNOSIS — N83209 Unspecified ovarian cyst, unspecified side: Secondary | ICD-10-CM | POA: Insufficient documentation

## 2013-09-16 HISTORY — DX: Type 2 diabetes mellitus without complications: E11.9

## 2013-09-16 LAB — COMPREHENSIVE METABOLIC PANEL
ALT: 60 U/L — ABNORMAL HIGH (ref 0–35)
AST: 44 U/L — ABNORMAL HIGH (ref 0–37)
Albumin: 3.8 g/dL (ref 3.5–5.2)
Alkaline Phosphatase: 59 U/L (ref 39–117)
Anion gap: 17 — ABNORMAL HIGH (ref 5–15)
BUN: 18 mg/dL (ref 6–23)
CO2: 23 mEq/L (ref 19–32)
Calcium: 9.5 mg/dL (ref 8.4–10.5)
Chloride: 98 mEq/L (ref 96–112)
Creatinine, Ser: 0.86 mg/dL (ref 0.50–1.10)
GFR calc Af Amer: 82 mL/min — ABNORMAL LOW (ref >90)
GFR calc non Af Amer: 70 mL/min — ABNORMAL LOW (ref >90)
Glucose, Bld: 220 mg/dL — ABNORMAL HIGH (ref 70–99)
Potassium: 3.6 mEq/L — ABNORMAL LOW (ref 3.7–5.3)
Sodium: 138 mEq/L (ref 137–147)
Total Bilirubin: 0.4 mg/dL (ref 0.3–1.2)
Total Protein: 7.9 g/dL (ref 6.0–8.3)

## 2013-09-16 LAB — CBC
HCT: 42.9 % (ref 36.0–46.0)
Hemoglobin: 14.4 g/dL (ref 12.0–15.0)
MCH: 29.4 pg (ref 26.0–34.0)
MCHC: 33.6 g/dL (ref 30.0–36.0)
MCV: 87.7 fL (ref 78.0–100.0)
Platelets: 253 10*3/uL (ref 150–400)
RBC: 4.89 MIL/uL (ref 3.87–5.11)
RDW: 13.8 % (ref 11.5–15.5)
WBC: 11 10*3/uL — ABNORMAL HIGH (ref 4.0–10.5)

## 2013-09-16 LAB — ABO/RH: ABO/RH(D): A POS

## 2013-09-16 LAB — HEMOGLOBIN A1C
Hgb A1c MFr Bld: 7.2 % — ABNORMAL HIGH (ref <5.7)
Mean Plasma Glucose: 160 mg/dL — ABNORMAL HIGH (ref <117)

## 2013-09-16 LAB — TYPE AND SCREEN
ABO/RH(D): A POS
Antibody Screen: NEGATIVE

## 2013-09-16 MED ORDER — LACTATED RINGERS IV SOLN
INTRAVENOUS | Status: DC
  Administered 2013-09-16: 10:00:00 via INTRAVENOUS

## 2013-09-16 MED ORDER — HYDROMORPHONE HCL 2 MG PO TABS: 2.0000 mg | ORAL_TABLET | ORAL | Status: DC | PRN

## 2013-09-16 MED ORDER — ONDANSETRON 4 MG PO TBDP: 4.0000 mg | ORAL_TABLET | Freq: Four times a day (QID) | ORAL | Status: DC | PRN

## 2013-09-16 MED ORDER — HYDROCHLOROTHIAZIDE 25 MG PO TABS
25.0000 mg | ORAL_TABLET | Freq: Every day | ORAL | Status: DC
Start: 2013-09-16 — End: 2013-09-27

## 2013-09-16 MED ORDER — HYDROMORPHONE HCL PF 1 MG/ML IJ SOLN
2.0000 mg | INTRAMUSCULAR | Status: DC | PRN
  Administered 2013-09-16: 2 mg via INTRAVENOUS
  Filled 2013-09-16: qty 2

## 2013-09-16 MED ORDER — ONDANSETRON HCL 4 MG/2ML IJ SOLN
4.0000 mg | Freq: Once | INTRAMUSCULAR | Status: AC
  Administered 2013-09-16: 4 mg via INTRAVENOUS
  Filled 2013-09-16: qty 2

## 2013-09-16 MED ORDER — ONDANSETRON 4 MG PO TBDP
4.0000 mg | ORAL_TABLET | Freq: Four times a day (QID) | ORAL | Status: DC | PRN
  Administered 2013-09-16: 4 mg via ORAL
  Filled 2013-09-16: qty 1

## 2013-09-16 MED ORDER — KETOROLAC TROMETHAMINE 30 MG/ML IJ SOLN
30.0000 mg | Freq: Once | INTRAMUSCULAR | Status: AC
  Administered 2013-09-16: 30 mg via INTRAVENOUS
  Filled 2013-09-16: qty 1

## 2013-09-16 MED ORDER — HYDROMORPHONE HCL PF 1 MG/ML IJ SOLN
2.0000 mg | Freq: Once | INTRAMUSCULAR | Status: AC
  Administered 2013-09-16: 2 mg via INTRAVENOUS
  Filled 2013-09-16: qty 2

## 2013-09-16 MED ORDER — HYDRALAZINE HCL 20 MG/ML IJ SOLN
10.0000 mg | Freq: Once | INTRAMUSCULAR | Status: AC
  Administered 2013-09-16: 10 mg via INTRAVENOUS
  Filled 2013-09-16: qty 1

## 2013-09-16 NOTE — Discharge Instructions (Signed)
Please report to [facility] at 8:30 am on Monday 09/20/13 for scheduled surgery; surgery time is 10 am.  Nothing to eat or drink after midnight on Sunday before surgery.   Laparoscopically Assisted Vaginal Hysterectomy  A laparoscopically assisted vaginal hysterectomy (LAVH) is a surgical procedure to remove the uterus and cervix, and sometimes the ovaries and fallopian tubes. During an LAVH, some of the surgical removal is done through the vagina, and the rest is done through a few small surgical cuts (incisions) in the abdomen.  This procedure is usually considered in women when a vaginal hysterectomy is not an option. Your health care provider will discuss the risks and benefits of the different surgical techniques at your appointment. Generally, recovery time is faster and there are fewer complications after laparoscopic procedures than after open incisional procedures. LET [facility] CARE PROVIDER KNOW ABOUT:   Any allergies you have.  All medicines you are taking, including vitamins, herbs, eye drops, creams, and over-the-counter medicines.  Previous problems you or members of your family have had with the use of anesthetics.  Any blood disorders you have.  Previous surgeries you have had.  Medical conditions you have. RISKS AND COMPLICATIONS Generally, this is a safe procedure. However, as with any procedure, complications can occur. Possible complications include:  Allergies to medicines.  Difficulty breathing.  Bleeding.  Infection.  Damage to other structures near your uterus and cervix. BEFORE THE PROCEDURE  Ask your health care provider about changing or stopping your regular medicines.  Take certain medicines, such as a colon-emptying preparation, as directed.  Do not eat or drink anything for at least 8 hours before your surgery.  Stop smoking if you smoke. Stopping will improve your health after surgery.  Arrange for a ride home after  surgery and for help at home during recovery. PROCEDURE   An IV tube will be put into one of your veins in order to give you fluids and medicines.  You will receive medicines to relax you and medicines that make you sleep (general anesthetic).  You may have a flexible tube (catheter) put into your bladder to drain urine.  You may have a tube put through your nose or mouth that goes into your stomach (nasogastric tube). The nasogastric tube removes digestive fluids and prevents you from feeling nauseated and from vomiting.  Tight-fitting (compression) stockings will be placed on your legs to promote circulation.  Three to four small incisions will be made in your abdomen. An incision also will be made in your vagina. Probes and tools will be inserted into the small incisions. The uterus and cervix are removed (and possibly your ovaries and fallopian tubes) through your vagina as well as through the small incisions that were made in the abdomen.  Your vagina is then sewn back to normal. AFTER THE PROCEDURE  You may have a liquid diet temporarily. You will most likely return to, and tolerate, your usual diet the day after surgery.  You will be passing urine through a catheter. It will be removed the day after surgery.  Your temperature, breathing rate, heart rate, blood pressure, and oxygen level will be monitored regularly.  You will still wear compression stockings on your legs until you are able to move around.  You will use a special device or do breathing exercises to keep your lungs clear.  You will be encouraged to walk as soon as possible. Document Released: 01/10/2011 Document Revised: 09/23/2012 Document Reviewed: 08/06/2012 ExitCare Patient Information  2015 [employer]. This information is not intended to replace advice given to you by your health care provider. Make sure you discuss any questions you have with your health care provider.

## 2013-09-16 NOTE — MAU Note (Signed)
Discharge instructions reviewed including scheduled Surgical case for Monday. Medications reviewed. Patient questions answered. Patient verbalized understanding of discharge instructions. Pain medication given upon discharge per MD request; patient to vehicle via wheelchair.

## 2013-09-16 NOTE — MAU Note (Signed)
Ultrasound Sonographer at bedside for ultrasound

## 2013-09-16 NOTE — MAU Note (Signed)
Dr. Harolyn Rutherford at bedside discussing discharge plan of care and pain medications.

## 2013-09-16 NOTE — MAU Provider Note (Signed)
Chief Complaint: Abdominal Pain  First Provider Initiated Contact with Patient 09/16/13 0950     SUBJECTIVE HPI: Rachel Herman is a 63 y.o. G3P3003 post-menopausal female who presents with severe LLQ pain since 0630. Same as pain episode last month. Dx'd w/ 13 cm pelvic cyst of unknown origin. EBX performed 3 days ago in [facility] for PMB. Tentatively scheduled for LSO on 10/28/13 pending biopsy results.  Past Medical History  Diagnosis Date  . Hypertension    OB History  Gravida Para Term Preterm AB SAB TAB Ectopic Multiple Living  3 3 3  0 0 0 0 0 0 3    # Outcome Date GA Lbr Len/2nd Weight Sex Delivery Anes PTL Lv  3 TRM           2 TRM           1 TRM              Past Surgical History  Procedure Laterality Date  . Endometrial biopsy     History   Social History  . Marital Status: Married    Spouse Name: N/A    Number of Children: N/A  . Years of Education: N/A   Occupational History  . Not on file.   Social History Main Topics  . Smoking status: Never Smoker   . Smokeless tobacco: Never Used  . Alcohol Use: No  . Drug Use: No  . Sexual Activity: Not Currently    Birth Control/ Protection: Post-menopausal   Other Topics Concern  . Not on file   Social History Narrative  . No narrative on file   No current facility-administered medications on file prior to encounter.   Current Outpatient Prescriptions on File Prior to Encounter  Medication Sig Dispense Refill  . ibuprofen (ADVIL,MOTRIN) 800 MG tablet Take 1 tablet (800 mg total) by mouth 3 (three) times daily.  30 tablet  2  . Multiple Vitamins-Minerals (MULTIVITAMIN WITH MINERALS) tablet Take 1 tablet by mouth daily.      . ondansetron (ZOFRAN) 4 MG tablet Take 1 tablet (4 mg total) by mouth every 8 (eight) hours as needed for nausea or vomiting.  10 tablet  0  . oxyCODONE-acetaminophen (PERCOCET/ROXICET) 5-325 MG per tablet Take 1-2 tablets by mouth every 6 (six) hours as needed for severe pain. May take 2  tablets PO q 6 hours for severe pain - Do not take with Tylenol as this tablet already contains tylenol  30 tablet  0   No Known Allergies  ROS: Pertinent items in HPI  OBJECTIVE Blood pressure 170/86, pulse 72, temperature 97.5 F (36.4 C), temperature source Oral, resp. rate 17, height 5\' 7"  (1.702 m), weight 265 lb (120.203 kg), SpO2 96.00%. GENERAL: Well-developed, well-nourished female in no acute distress.  HEENT: Normocephalic HEART: normal rate RESP: normal effort ABDOMEN: Soft, obese, moderate tenderness in lower abdomen, no rebound, no guarding. EXTREMITIES: Nontender, no edema NEURO: Alert and oriented SPECULUM EXAM: Deferred  LAB RESULTS Results for orders placed during the hospital encounter of 09/16/13 (from the past 24 hour(s))  CBC     Status: Abnormal   Collection Time    09/16/13 10:00 AM      Result Value Ref Range   WBC 11.0 (*) 4.0 - 10.5 K/uL   RBC 4.89  3.87 - 5.11 MIL/uL   Hemoglobin 14.4  12.0 - 15.0 g/dL   HCT 42.9  36.0 - 46.0 %   MCV 87.7  78.0 - 100.0 fL  MCH 29.4  26.0 - 34.0 pg   MCHC 33.6  30.0 - 36.0 g/dL   RDW 13.8  11.5 - 15.5 %   Platelets 253  150 - 400 K/uL  COMPREHENSIVE METABOLIC PANEL     Status: Abnormal   Collection Time    09/16/13 10:00 AM      Result Value Ref Range   Sodium 138  137 - 147 mEq/L   Potassium 3.6 (*) 3.7 - 5.3 mEq/L   Chloride 98  96 - 112 mEq/L   CO2 23  19 - 32 mEq/L   Glucose, Bld 220 (*) 70 - 99 mg/dL   BUN 18  6 - 23 mg/dL   Creatinine, Ser 0.86  0.50 - 1.10 mg/dL   Calcium 9.5  8.4 - 10.5 mg/dL   Total Protein 7.9  6.0 - 8.3 g/dL   Albumin 3.8  3.5 - 5.2 g/dL   AST 44 (*) 0 - 37 U/L   ALT 60 (*) 0 - 35 U/L   Alkaline Phosphatase 59  39 - 117 U/L   Total Bilirubin 0.4  0.3 - 1.2 mg/dL   GFR calc non Af Amer 70 (*) >90 mL/min   GFR calc Af Amer 82 (*) >90 mL/min   Anion gap 17 (*) 5 - 15  TYPE AND SCREEN     Status: None   Collection Time    09/16/13 10:00 AM      Result Value Ref Range    ABO/RH(D) A POS     Antibody Screen NEG     Sample Expiration 09/19/2013    ABO/RH     Status: None   Collection Time    09/16/13 10:00 AM      Result Value Ref Range   ABO/RH(D) A POS    Hemoglobin A1C pending.  09/16/2013  EKG  NSR. Reviewed by Dr. Royce Macadamia (Anesthesiologist)  09/13/2013 ENDOMETRIAL BIOPSY: COMPLEX HYPERPLASIA WITH FOCAL ATYPIA  IMAGING 09/16/2013   CLINICAL DATA:  Severe pelvic pain. Large cystic left adnexal lesion. Postmenopausal vaginal bleeding.  EXAM: TRANSABDOMINAL AND TRANSVAGINAL ULTRASOUND OF PELVIS  DOPPLER ULTRASOUND OF OVARIES  TECHNIQUE: Both transabdominal and transvaginal ultrasound examinations of the pelvis were performed. Transabdominal technique was performed for global imaging of the pelvis including uterus, ovaries, adnexal regions, and pelvic cul-de-sac.  It was necessary to proceed with endovaginal exam following the transabdominal exam to visualize the endometrium and ovaries. Color and duplex Doppler ultrasound was utilized to evaluate blood flow to the ovaries.  COMPARISON:  08/17/2013  FINDINGS: Uterus  Measurements: 7.2 x 4.2 x 5.3 cm. No fibroids or other mass visualized.  Endometrium  Thickness: 13 mm. Multiple small cystic foci are seen throughout the endometrium. This is suspicious for cystic endometrial hyperplasia, although endometrial carcinoma cannot be excluded in the setting of postmenopausal bleeding.  Right ovary  Measurements: Not directly visualized by transabdominal or transvaginal sonography, however no adnexal mass identified.  Left ovary  Measurements: No normal ovarian tissue visualized. A large cystic lesion is again seen in the left adnexa which measures 11.5 x 10.4 x 12.5 cm. This is not simply changed in size or appearance. This is difficult to evaluate by ultrasound due to its large size, however no definite mural nodules or internal septations are identified.  Pulsed Doppler evaluation of the ovaries was attempted but was unsuccessful  as there is no normal left ovarian tissue seen surrounding this cystic lesion due to its large size. Right ovary was not directly visualized could  not be evaluated.  Other findings  No free fluid.  IMPRESSION: Stable 12 cm simple appearing left adnexal cyst which has benign characteristics. This is suspicious for a cystic ovarian neoplasm in a postmenopausal female, although most likely benign. Surgical evaluation should be considered. Ovarian torsion cannot definitely be excluded as no normal ovarian tissue could be visualized to assess by Doppler.  Thickened endometrium measuring 13 mm with cystic foci. Although this is suspicious for cystic endometrial hyperplasia, endometrial carcinoma cannot be excluded in this setting of postmenopausal bleeding. Endometrial sampling is recommended.   Electronically Signed   By: Earle Gell M.D.   On: 09/16/2013 12:23   08/17/2013   CLINICAL DATA:  Pelvic pain, left greater than right, follow-up ovarian cyst on CT  EXAM: TRANSABDOMINAL AND TRANSVAGINAL ULTRASOUND OF PELVIS  TECHNIQUE: Both transabdominal and transvaginal ultrasound examinations of the pelvis were performed. Transabdominal technique was performed for global imaging of the pelvis including uterus, ovaries, adnexal regions, and pelvic cul-de-sac. It was necessary to proceed with endovaginal exam following the transabdominal exam to visualize the endometrium and bilateral adnexa.  COMPARISON:  CT abdomen pelvis dated 08/10/2013  FINDINGS: Uterus  Measurements: 8.5 x 4.2 x 5.5 cm. No fibroids or other mass visualized.  Endometrium  Thickened endometrium, measuring 19 mm, with scattered cystic endometrial foci.  Right ovary  Not discretely visualized transabdominally or transvaginally.  Left ovary  Not discretely visualized transabdominally or transvaginally.  Other findings  13.0 x 9.6 x 12.0 cm simple appearing cystic lesion in the pelvis, to the right of midline, without associated septations, soft tissue mass,  or mural nodularity. No color Doppler flow.  When correlating with prior CT, this likely arises from the left ovary.  IMPRESSION: 13.0 cm cystic adnexal mass, simple. When correlating with prior CT, this likely arises from the left ovary. No overt malignant features. However, given size, surgical evaluation is suggested.  If further imaging is desired, consider MR pelvis with/without contrast.   Electronically Signed   By: Julian Hy M.D.   On: 08/17/2013 14:45    MAU COURSE 1207: Treated with Dilaudid and Toradol. Dr. Harolyn Rutherford consulted.  BP 198/115, treated with Hydralazine 10 mg IV x 1. Zofran given for nausea. 1330: Dr. Harolyn Rutherford discussed pathology findings of complex atypical endometrial hyperplasia on recent endometrial biopsy with patient.  Was scheduled for LSO on 10/28/13; this was changed to LAVH, BSO and moved up to 09/20/13.  Manya Silvas, CNM [facility]    Attestation of Attending Supervision of Advanced Practitioner (PA/CNM/NP): Evaluation and management procedures were performed by the Advanced Practitioner under my supervision and collaboration.  I have reviewed the Advanced Practitioner's note and chart, and I agree with the management of this patient.  I discussed the pathology findings and emphasized need for definitive surgery.  Surgery changed to LAVH, BSO and moved up to 09/20/13 at 1000.  Patient agrees with this proposed surgery.  The risks of surgery were discussed in detail with the patient including but not limited to: bleeding which may require transfusion or reoperation; infection which may require antibiotics; injury to bowel, bladder, ureters or other surrounding organs; need for additional procedures including laparotomy; thromboembolic phenomenon, incisional problems and other postoperative/anesthesia complications.  Patient was also advised that she will remain in house for 1-2 days; and expected recovery time after a hysterectomy is 6-8 weeks.  Likelihood of  success in alleviating the patient's symptoms was discussed. Routine postoperative instructions will be reviewed with the patient and her family  in detail after surgery.  Routine preoperative instructions of having nothing to eat or drink after midnight on the day prior to surgery and also coming to the hospital 1 1/2 hours prior to her time of surgery were also emphasized. Printed patient education handouts about the procedure was given to the patient to review at home.   ASSESSMENT 1. Pelvic pain in female   2. Other and unspecified ovarian cyst   3. Complex endometrial hyperplasia with atypia   4. Essential hypertension, benign   No acute changes on ultrasound; stable cyst. No acute abdomen. Hemodynamically stable. Pain treated with medications. BP also treated.   PLAN Discharge home LAVH, BSO surgery scheduled on 09/20/13 for 13 cm pelvic cyst, complex atypical endometrial hyperplasia Dilaudid prescribed for severe pain. Zofran as needed for nausea. HCTZ also prescribed, patient ran out of this for the past 3 months. Pain and bleeding precautions reviewed    Medication List    STOP taking these medications       oxyCODONE-acetaminophen 5-325 MG per tablet  Commonly known as:  PERCOCET/ROXICET      TAKE these medications       aspirin 325 MG tablet  Take 325 mg by mouth once.     hydrochlorothiazide 25 MG tablet  Commonly known as:  HYDRODIURIL  Take 1 tablet (25 mg total) by mouth daily.     HYDROmorphone 2 MG tablet  Commonly known as:  DILAUDID  Take 1 tablet (2 mg total) by mouth every 4 (four) hours as needed for severe pain.     ibuprofen 800 MG tablet  Commonly known as:  ADVIL,MOTRIN  Take 1 tablet (800 mg total) by mouth 3 (three) times daily.     multivitamin with minerals tablet  Take 1 tablet by mouth daily.     ondansetron 4 MG disintegrating tablet  Commonly known as:  ZOFRAN-ODT  Take 1 tablet (4 mg total) by mouth every 6 (six) hours as needed for  nausea or vomiting.     ondansetron 4 MG tablet  Commonly known as:  ZOFRAN  Take 1 tablet (4 mg total) by mouth every 8 (eight) hours as needed for nausea or vomiting.        Verita Schneiders, MD, [place] Attending [place], [facility]

## 2013-09-16 NOTE — Progress Notes (Signed)
Dr. Harolyn Rutherford at bedside discussing results and plan of care.

## 2013-09-16 NOTE — MAU Note (Addendum)
IV started; IV fluids infusing. Pain medication and zofran given. Labs drawn and pending. Serial BP's initiated. Marlou Porch CNM at bedside discussing plan of care.

## 2013-09-16 NOTE — MAU Note (Signed)
Patient presents to MAU with c/o severe sharp lower abdominal pain that started around 6 am this morning. States is not pregnant. Reports having experienced the same s/s 6 weeks ago and was told has a cyst.Denies vaginal bleeding or discharge at this time. Marlou Porch CNM notified of patients arrival and complaints.

## 2013-09-17 ENCOUNTER — Encounter: Payer: Self-pay | Admitting: Obstetrics & Gynecology

## 2013-09-17 ENCOUNTER — Encounter (HOSPITAL_COMMUNITY): Payer: Self-pay | Admitting: *Deleted

## 2013-09-17 DIAGNOSIS — Z6841 Body Mass Index (BMI) 40.0 and over, adult: Secondary | ICD-10-CM

## 2013-09-17 DIAGNOSIS — I10 Essential (primary) hypertension: Secondary | ICD-10-CM | POA: Insufficient documentation

## 2013-09-19 MED ORDER — DEXTROSE 5 % IV SOLN
2.0000 g | INTRAVENOUS | Status: AC
  Administered 2013-09-20: 2 g via INTRAVENOUS
  Filled 2013-09-19: qty 2

## 2013-09-20 ENCOUNTER — Encounter (HOSPITAL_COMMUNITY): Payer: Self-pay | Admitting: Obstetrics & Gynecology

## 2013-09-20 ENCOUNTER — Ambulatory Visit (HOSPITAL_COMMUNITY): Payer: No Typology Code available for payment source | Admitting: Certified Registered Nurse Anesthetist

## 2013-09-20 ENCOUNTER — Inpatient Hospital Stay (HOSPITAL_COMMUNITY)
Admission: RE | Admit: 2013-09-20 | Discharge: 2013-09-24 | DRG: 742 | Disposition: A | Discharge status: Home / Self Care | Payer: No Typology Code available for payment source | Source: Ambulatory Visit | Attending: Obstetrics & Gynecology | Admitting: Obstetrics & Gynecology

## 2013-09-20 ENCOUNTER — Encounter (HOSPITAL_COMMUNITY)
Admission: RE | Disposition: A | Discharge status: Home / Self Care | Payer: Self-pay | Source: Ambulatory Visit | Attending: Obstetrics & Gynecology

## 2013-09-20 ENCOUNTER — Encounter (HOSPITAL_COMMUNITY): Payer: Self-pay | Admitting: Certified Registered Nurse Anesthetist

## 2013-09-20 DIAGNOSIS — N83209 Unspecified ovarian cyst, unspecified side: Secondary | ICD-10-CM

## 2013-09-20 DIAGNOSIS — N8502 Endometrial intraepithelial neoplasia [EIN]: Secondary | ICD-10-CM

## 2013-09-20 DIAGNOSIS — K560 Paralytic ileus: Secondary | ICD-10-CM | POA: Diagnosis not present

## 2013-09-20 DIAGNOSIS — K668 Other specified disorders of peritoneum: Secondary | ICD-10-CM | POA: Diagnosis present

## 2013-09-20 DIAGNOSIS — D279 Benign neoplasm of unspecified ovary: Secondary | ICD-10-CM | POA: Diagnosis present

## 2013-09-20 DIAGNOSIS — R9389 Abnormal findings on diagnostic imaging of other specified body structures: Secondary | ICD-10-CM | POA: Diagnosis present

## 2013-09-20 DIAGNOSIS — Z90710 Acquired absence of both cervix and uterus: Secondary | ICD-10-CM

## 2013-09-20 DIAGNOSIS — Z90722 Acquired absence of ovaries, bilateral: Secondary | ICD-10-CM

## 2013-09-20 DIAGNOSIS — Z6841 Body Mass Index (BMI) 40.0 and over, adult: Secondary | ICD-10-CM

## 2013-09-20 DIAGNOSIS — I10 Essential (primary) hypertension: Secondary | ICD-10-CM | POA: Diagnosis present

## 2013-09-20 DIAGNOSIS — N8353 Torsion of ovary, ovarian pedicle and fallopian tube: Secondary | ICD-10-CM | POA: Diagnosis present

## 2013-09-20 DIAGNOSIS — Z9079 Acquired absence of other genital organ(s): Secondary | ICD-10-CM

## 2013-09-20 DIAGNOSIS — N8000 Endometriosis of the uterus, unspecified: Secondary | ICD-10-CM | POA: Diagnosis present

## 2013-09-20 DIAGNOSIS — K929 Disease of digestive system, unspecified: Secondary | ICD-10-CM | POA: Diagnosis not present

## 2013-09-20 DIAGNOSIS — N949 Unspecified condition associated with female genital organs and menstrual cycle: Secondary | ICD-10-CM | POA: Diagnosis present

## 2013-09-20 DIAGNOSIS — E119 Type 2 diabetes mellitus without complications: Secondary | ICD-10-CM | POA: Diagnosis present

## 2013-09-20 DIAGNOSIS — N950 Postmenopausal bleeding: Principal | ICD-10-CM | POA: Diagnosis present

## 2013-09-20 HISTORY — PX: SALPINGOOPHORECTOMY: SHX82

## 2013-09-20 HISTORY — DX: Morbid (severe) obesity due to excess calories: E66.01

## 2013-09-20 HISTORY — PX: ABDOMINAL HYSTERECTOMY: SHX81

## 2013-09-20 HISTORY — PX: LAPAROSCOPIC ASSISTED VAGINAL HYSTERECTOMY: SHX5398

## 2013-09-20 HISTORY — DX: Type 2 diabetes mellitus without complications: E11.9

## 2013-09-20 LAB — GLUCOSE, CAPILLARY
Glucose-Capillary: 169 mg/dL — ABNORMAL HIGH (ref 70–99)
Glucose-Capillary: 188 mg/dL — ABNORMAL HIGH (ref 70–99)

## 2013-09-20 LAB — BASIC METABOLIC PANEL
Anion gap: 14 (ref 5–15)
BUN: 19 mg/dL (ref 6–23)
CO2: 29 mEq/L (ref 19–32)
Calcium: 9.2 mg/dL (ref 8.4–10.5)
Chloride: 92 mEq/L — ABNORMAL LOW (ref 96–112)
Creatinine, Ser: 0.86 mg/dL (ref 0.50–1.10)
GFR calc Af Amer: 82 mL/min — ABNORMAL LOW (ref >90)
GFR calc non Af Amer: 70 mL/min — ABNORMAL LOW (ref >90)
Glucose, Bld: 154 mg/dL — ABNORMAL HIGH (ref 70–99)
Potassium: 3.5 mEq/L — ABNORMAL LOW (ref 3.7–5.3)
Sodium: 135 mEq/L — ABNORMAL LOW (ref 137–147)

## 2013-09-20 LAB — CBC
HCT: 41.7 % (ref 36.0–46.0)
Hemoglobin: 13.9 g/dL (ref 12.0–15.0)
MCH: 28.8 pg (ref 26.0–34.0)
MCHC: 33.3 g/dL (ref 30.0–36.0)
MCV: 86.5 fL (ref 78.0–100.0)
Platelets: 283 10*3/uL (ref 150–400)
RBC: 4.82 MIL/uL (ref 3.87–5.11)
RDW: 14.5 % (ref 11.5–15.5)
WBC: 15.6 10*3/uL — ABNORMAL HIGH (ref 4.0–10.5)

## 2013-09-20 LAB — TYPE AND SCREEN
ABO/RH(D): A POS
Antibody Screen: NEGATIVE

## 2013-09-20 SURGERY — HYSTERECTOMY, ABDOMINAL
Anesthesia: General | Site: Abdomen

## 2013-09-20 MED ORDER — ROCURONIUM BROMIDE 100 MG/10ML IV SOLN
INTRAVENOUS | Status: AC
  Filled 2013-09-20: qty 1

## 2013-09-20 MED ORDER — SCOPOLAMINE 1 MG/3DAYS TD PT72
1.0000 | MEDICATED_PATCH | Freq: Once | TRANSDERMAL | Status: AC
  Administered 2013-09-20: 1.5 mg via TRANSDERMAL
  Administered 2013-09-20: 1 via TRANSDERMAL

## 2013-09-20 MED ORDER — SCOPOLAMINE 1 MG/3DAYS TD PT72
MEDICATED_PATCH | TRANSDERMAL | Status: AC
  Administered 2013-09-20: 1.5 mg via TRANSDERMAL
  Filled 2013-09-20: qty 1

## 2013-09-20 MED ORDER — NALOXONE HCL 0.4 MG/ML IJ SOLN: 0.4000 mg | INTRAMUSCULAR | Status: DC | PRN

## 2013-09-20 MED ORDER — FENTANYL CITRATE 0.05 MG/ML IJ SOLN
25.0000 ug | INTRAMUSCULAR | Status: DC | PRN
  Administered 2013-09-20: 50 ug via INTRAVENOUS
  Administered 2013-09-20 (×2): 25 ug via INTRAVENOUS

## 2013-09-20 MED ORDER — MIDAZOLAM HCL 2 MG/2ML IJ SOLN
INTRAMUSCULAR | Status: AC
  Filled 2013-09-20: qty 2

## 2013-09-20 MED ORDER — ONDANSETRON HCL 4 MG PO TABS: 4.0000 mg | ORAL_TABLET | Freq: Four times a day (QID) | ORAL | Status: DC | PRN

## 2013-09-20 MED ORDER — DEXAMETHASONE SODIUM PHOSPHATE 10 MG/ML IJ SOLN
INTRAMUSCULAR | Status: AC
  Filled 2013-09-20: qty 1

## 2013-09-20 MED ORDER — GLYCOPYRROLATE 0.2 MG/ML IJ SOLN
INTRAMUSCULAR | Status: DC | PRN
  Administered 2013-09-20: .8 mg via INTRAVENOUS

## 2013-09-20 MED ORDER — FENTANYL CITRATE 0.05 MG/ML IJ SOLN
INTRAMUSCULAR | Status: AC
  Filled 2013-09-20: qty 5

## 2013-09-20 MED ORDER — MENTHOL 3 MG MT LOZG: 1.0000 | LOZENGE | OROMUCOSAL | Status: DC | PRN

## 2013-09-20 MED ORDER — ROCURONIUM BROMIDE 100 MG/10ML IV SOLN
INTRAVENOUS | Status: DC | PRN
  Administered 2013-09-20: 50 mg via INTRAVENOUS
  Administered 2013-09-20 (×3): 10 mg via INTRAVENOUS

## 2013-09-20 MED ORDER — LACTATED RINGERS IV SOLN
INTRAVENOUS | Status: DC
  Administered 2013-09-20 (×3): via INTRAVENOUS

## 2013-09-20 MED ORDER — FENTANYL CITRATE 0.05 MG/ML IJ SOLN
INTRAMUSCULAR | Status: DC | PRN
  Administered 2013-09-20: 100 ug via INTRAVENOUS
  Administered 2013-09-20: 50 ug via INTRAVENOUS
  Administered 2013-09-20: 75 ug via INTRAVENOUS
  Administered 2013-09-20: 25 ug via INTRAVENOUS
  Administered 2013-09-20: 100 ug via INTRAVENOUS

## 2013-09-20 MED ORDER — LIDOCAINE HCL (CARDIAC) 20 MG/ML IV SOLN
INTRAVENOUS | Status: AC
  Filled 2013-09-20: qty 5

## 2013-09-20 MED ORDER — SODIUM CHLORIDE 0.9 % IJ SOLN
INTRAMUSCULAR | Status: AC
  Filled 2013-09-20: qty 50

## 2013-09-20 MED ORDER — MIDAZOLAM HCL 2 MG/2ML IJ SOLN
INTRAMUSCULAR | Status: DC | PRN
  Administered 2013-09-20: 1 mg via INTRAVENOUS

## 2013-09-20 MED ORDER — NEOSTIGMINE METHYLSULFATE 10 MG/10ML IV SOLN
INTRAVENOUS | Status: DC | PRN
  Administered 2013-09-20: 5 mg via INTRAVENOUS

## 2013-09-20 MED ORDER — HYDROMORPHONE HCL PF 1 MG/ML IJ SOLN
INTRAMUSCULAR | Status: AC
  Filled 2013-09-20: qty 1

## 2013-09-20 MED ORDER — HYDROMORPHONE HCL PF 1 MG/ML IJ SOLN
INTRAMUSCULAR | Status: DC | PRN
  Administered 2013-09-20: 2 mg via INTRAVENOUS

## 2013-09-20 MED ORDER — ONDANSETRON HCL 4 MG/2ML IJ SOLN
INTRAMUSCULAR | Status: DC | PRN
  Administered 2013-09-20: 4 mg via INTRAVENOUS

## 2013-09-20 MED ORDER — OXYCODONE-ACETAMINOPHEN 5-325 MG PO TABS
1.0000 | ORAL_TABLET | ORAL | Status: DC | PRN
  Administered 2013-09-22: 2 via ORAL
  Administered 2013-09-22: 1 via ORAL
  Filled 2013-09-20 (×3): qty 1

## 2013-09-20 MED ORDER — DIPHENHYDRAMINE HCL 50 MG/ML IJ SOLN: 12.5000 mg | Freq: Four times a day (QID) | INTRAMUSCULAR | Status: DC | PRN

## 2013-09-20 MED ORDER — PANTOPRAZOLE SODIUM 40 MG PO TBEC
40.0000 mg | DELAYED_RELEASE_TABLET | Freq: Every day | ORAL | Status: DC
  Administered 2013-09-21 – 2013-09-24 (×4): 40 mg via ORAL
  Filled 2013-09-20 (×4): qty 1

## 2013-09-20 MED ORDER — ONDANSETRON HCL 4 MG/2ML IJ SOLN
INTRAMUSCULAR | Status: AC
  Filled 2013-09-20: qty 2

## 2013-09-20 MED ORDER — LACTATED RINGERS IV SOLN
INTRAVENOUS | Status: DC
  Administered 2013-09-20 – 2013-09-22 (×3): via INTRAVENOUS

## 2013-09-20 MED ORDER — LACTATED RINGERS IV SOLN: INTRAVENOUS | Status: DC

## 2013-09-20 MED ORDER — LIDOCAINE HCL (CARDIAC) 20 MG/ML IV SOLN
INTRAVENOUS | Status: DC | PRN
  Administered 2013-09-20: 60 mg via INTRAVENOUS

## 2013-09-20 MED ORDER — HYDROMORPHONE HCL PF 1 MG/ML IJ SOLN: 0.2000 mg | INTRAMUSCULAR | Status: DC | PRN

## 2013-09-20 MED ORDER — 0.9 % SODIUM CHLORIDE (POUR BTL) OPTIME
TOPICAL | Status: DC | PRN
  Administered 2013-09-20: 1000 mL

## 2013-09-20 MED ORDER — BUPIVACAINE HCL (PF) 0.5 % IJ SOLN
INTRAMUSCULAR | Status: DC | PRN
  Administered 2013-09-20: 30 mL

## 2013-09-20 MED ORDER — KETOROLAC TROMETHAMINE 30 MG/ML IJ SOLN
INTRAMUSCULAR | Status: AC
  Filled 2013-09-20: qty 1

## 2013-09-20 MED ORDER — DOCUSATE SODIUM 100 MG PO CAPS
100.0000 mg | ORAL_CAPSULE | Freq: Two times a day (BID) | ORAL | Status: DC
  Administered 2013-09-20 – 2013-09-24 (×8): 100 mg via ORAL
  Filled 2013-09-20 (×8): qty 1

## 2013-09-20 MED ORDER — IBUPROFEN 600 MG PO TABS
600.0000 mg | ORAL_TABLET | Freq: Four times a day (QID) | ORAL | Status: DC | PRN
  Administered 2013-09-22 – 2013-09-23 (×4): 600 mg via ORAL
  Filled 2013-09-20 (×4): qty 1

## 2013-09-20 MED ORDER — PROPOFOL 10 MG/ML IV BOLUS
INTRAVENOUS | Status: DC | PRN
  Administered 2013-09-20: 200 mg via INTRAVENOUS

## 2013-09-20 MED ORDER — SODIUM CHLORIDE 0.9 % IJ SOLN: 9.0000 mL | INTRAMUSCULAR | Status: DC | PRN

## 2013-09-20 MED ORDER — SIMETHICONE 80 MG PO CHEW: 80.0000 mg | CHEWABLE_TABLET | Freq: Four times a day (QID) | ORAL | Status: DC | PRN

## 2013-09-20 MED ORDER — DIPHENHYDRAMINE HCL 12.5 MG/5ML PO ELIX
12.5000 mg | ORAL_SOLUTION | Freq: Four times a day (QID) | ORAL | Status: DC | PRN
Start: 2013-09-20 — End: 2013-09-22

## 2013-09-20 MED ORDER — GLYCOPYRROLATE 0.2 MG/ML IJ SOLN
INTRAMUSCULAR | Status: AC
  Filled 2013-09-20: qty 3

## 2013-09-20 MED ORDER — HYDROMORPHONE 0.3 MG/ML IV SOLN
INTRAVENOUS | Status: DC
Start: 2013-09-20 — End: 2013-09-22
  Administered 2013-09-20: 0.99 mg via INTRAVENOUS
  Administered 2013-09-20: 1.59 mg via INTRAVENOUS
  Administered 2013-09-20: 16:00:00 via INTRAVENOUS
  Administered 2013-09-21 (×2): 1.79 mg via INTRAVENOUS
  Administered 2013-09-21: 1.19 mg via INTRAVENOUS
  Administered 2013-09-21: 2.59 mg via INTRAVENOUS
  Administered 2013-09-21: 1.59 mg via INTRAVENOUS
  Administered 2013-09-21: 1.19 mg via INTRAVENOUS
  Administered 2013-09-22: 2 mg via INTRAVENOUS
  Filled 2013-09-20 (×2): qty 25

## 2013-09-20 MED ORDER — HYDROCHLOROTHIAZIDE 25 MG PO TABS
25.0000 mg | ORAL_TABLET | Freq: Every day | ORAL | Status: DC
  Administered 2013-09-21 – 2013-09-24 (×4): 25 mg via ORAL
  Filled 2013-09-20 (×6): qty 1

## 2013-09-20 MED ORDER — NEOSTIGMINE METHYLSULFATE 10 MG/10ML IV SOLN
INTRAVENOUS | Status: AC
  Filled 2013-09-20: qty 1

## 2013-09-20 MED ORDER — PROPOFOL 10 MG/ML IV EMUL
INTRAVENOUS | Status: AC
  Filled 2013-09-20: qty 20

## 2013-09-20 MED ORDER — FENTANYL CITRATE 0.05 MG/ML IJ SOLN
INTRAMUSCULAR | Status: AC
  Administered 2013-09-20: 50 ug via INTRAVENOUS
  Filled 2013-09-20: qty 2

## 2013-09-20 MED ORDER — ONDANSETRON HCL 4 MG/2ML IJ SOLN
4.0000 mg | Freq: Four times a day (QID) | INTRAMUSCULAR | Status: DC | PRN
  Administered 2013-09-21 – 2013-09-22 (×2): 4 mg via INTRAVENOUS
  Filled 2013-09-20 (×2): qty 2

## 2013-09-20 MED ORDER — BUPIVACAINE-EPINEPHRINE (PF) 0.5% -1:200000 IJ SOLN
INTRAMUSCULAR | Status: AC
  Filled 2013-09-20: qty 30

## 2013-09-20 MED ORDER — BUPIVACAINE HCL (PF) 0.5 % IJ SOLN
INTRAMUSCULAR | Status: AC
  Filled 2013-09-20: qty 30

## 2013-09-20 SURGICAL SUPPLY — 59 items
APPLICATOR COTTON TIP 6IN STRL (MISCELLANEOUS) IMPLANT
BENZOIN TINCTURE PRP APPL 2/3 (GAUZE/BANDAGES/DRESSINGS) ×3 IMPLANT
BLADE SURG 10 STRL SS (BLADE) ×3 IMPLANT
BLADE SURG 11 STRL SS (BLADE) ×6 IMPLANT
CABLE HIGH FREQUENCY MONO STRZ (ELECTRODE) IMPLANT
CANISTER SUCT 3000ML (MISCELLANEOUS) ×3 IMPLANT
CLOTH BEACON ORANGE TIMEOUT ST (SAFETY) ×3 IMPLANT
CONT PATH 16OZ SNAP LID 3702 (MISCELLANEOUS) ×3 IMPLANT
COUNTER NEEDLE 1200 MAGNETIC (NEEDLE) IMPLANT
COVER TABLE BACK 60X90 (DRAPES) ×3 IMPLANT
DECANTER SPIKE VIAL GLASS SM (MISCELLANEOUS) ×6 IMPLANT
DERMABOND ADVANCED (GAUZE/BANDAGES/DRESSINGS) ×1
DERMABOND ADVANCED .7 DNX12 (GAUZE/BANDAGES/DRESSINGS) ×2 IMPLANT
DRSG COVADERM PLUS 2X2 (GAUZE/BANDAGES/DRESSINGS) IMPLANT
DRSG OPSITE POSTOP 3X4 (GAUZE/BANDAGES/DRESSINGS) IMPLANT
DRSG OPSITE POSTOP 4X10 (GAUZE/BANDAGES/DRESSINGS) ×3 IMPLANT
DURAPREP 26ML APPLICATOR (WOUND CARE) ×3 IMPLANT
ELECT REM PT RETURN 9FT ADLT (ELECTROSURGICAL) ×3
ELECTRODE REM PT RTRN 9FT ADLT (ELECTROSURGICAL) ×2 IMPLANT
EVACUATOR SMOKE 8.L (FILTER) ×3 IMPLANT
GLOVE BIOGEL PI IND STRL 6.5 (GLOVE) ×2 IMPLANT
GLOVE BIOGEL PI IND STRL 7.0 (GLOVE) ×6 IMPLANT
GLOVE BIOGEL PI INDICATOR 6.5 (GLOVE) ×1
GLOVE BIOGEL PI INDICATOR 7.0 (GLOVE) ×3
GLOVE ECLIPSE 7.0 STRL STRAW (GLOVE) ×3 IMPLANT
GOWN STRL REUS W/ TWL LRG LVL3 (GOWN DISPOSABLE) ×14 IMPLANT
GOWN STRL REUS W/TWL LRG LVL3 (GOWN DISPOSABLE) ×7
HEMOSTAT SURGICEL 4X8 (HEMOSTASIS) ×3 IMPLANT
NEEDLE HYPO 22GX1.5 SAFETY (NEEDLE) ×3 IMPLANT
NEEDLE INSUFFLATION 120MM (ENDOMECHANICALS) ×3 IMPLANT
NEEDLE MAYO .5 CIRCLE (NEEDLE) ×3 IMPLANT
NS IRRIG 1000ML POUR BTL (IV SOLUTION) ×3 IMPLANT
PACK LAVH (CUSTOM PROCEDURE TRAY) ×3 IMPLANT
PAD ABD 7.5X8 STRL (GAUZE/BANDAGES/DRESSINGS) ×3 IMPLANT
PROTECTOR NERVE ULNAR (MISCELLANEOUS) ×9 IMPLANT
SEALER TISSUE G2 CVD JAW 35 (ENDOMECHANICALS) IMPLANT
SEALER TISSUE G2 CVD JAW 45CM (ENDOMECHANICALS) IMPLANT
SET IRRIG TUBING LAPAROSCOPIC (IRRIGATION / IRRIGATOR) ×3 IMPLANT
SHEARS HARMONIC ACE PLUS 36CM (ENDOMECHANICALS) IMPLANT
SPONGE GAUZE 4X4 12PLY STER LF (GAUZE/BANDAGES/DRESSINGS) ×3 IMPLANT
SPONGE LAP 18X18 X RAY DECT (DISPOSABLE) ×15 IMPLANT
STRIP CLOSURE SKIN 1/2X4 (GAUZE/BANDAGES/DRESSINGS) ×3 IMPLANT
SUT PDS AB 0 CTXB 36 (SUTURE) ×6 IMPLANT
SUT VIC AB 0 CT1 18XCR BRD8 (SUTURE) ×6 IMPLANT
SUT VIC AB 0 CT1 27 (SUTURE) ×5
SUT VIC AB 0 CT1 27XBRD ANBCTR (SUTURE) ×10 IMPLANT
SUT VIC AB 0 CT1 36 (SUTURE) ×3 IMPLANT
SUT VIC AB 0 CT1 8-18 (SUTURE) ×3
SUT VICRYL 0 ENDOLOOP (SUTURE) IMPLANT
SUT VICRYL 0 TIES 12 18 (SUTURE) ×3 IMPLANT
SUT VICRYL 0 UR6 27IN ABS (SUTURE) ×6 IMPLANT
SUT VICRYL 4-0 PS2 18IN ABS (SUTURE) ×6 IMPLANT
TAPE CLOTH SURG 4X10 WHT LF (GAUZE/BANDAGES/DRESSINGS) ×3 IMPLANT
TOWEL OR 17X24 6PK STRL BLUE (TOWEL DISPOSABLE) ×12 IMPLANT
TRAY FOLEY CATH 14FR (SET/KITS/TRAYS/PACK) ×3 IMPLANT
TROCAR XCEL NON-BLD 11X100MML (ENDOMECHANICALS) ×3 IMPLANT
TROCAR XCEL NON-BLD 5MMX100MML (ENDOMECHANICALS) ×6 IMPLANT
WARMER LAPAROSCOPE (MISCELLANEOUS) ×3 IMPLANT
WATER STERILE IRR 1000ML POUR (IV SOLUTION) IMPLANT

## 2013-09-20 NOTE — H&P (View-Only) (Signed)
CLINIC ENCOUNTER NOTE  History:  63 y.o. [id number] here today for evaluation of thickened endometrium seen on ultrasound done for evaluation of PMB.  Of note, pelvic ultrasound also showed 13 cm simple appearing left ovarian cyst. Was seen on 08/18/13, had pap smear done which was neagtive. Recent CA -125 was 15.3.  Patient is here today for endometrial biopsy and further management. She reports some continued spotting and pelvic pain; desires refill of her Percocet and Ibuprofen.  The following portions of the patient's history were reviewed and updated as appropriate: allergies, current medications, past family history, past medical history, past social history, past surgical history and problem list.  Review of Systems:  Pertinent items are noted in HPI.  Objective:  Physical Exam BP 126/82  Pulse 72  Temp(Src) 97.7 F (36.5 C) (Oral)  Ht 5\' 7"  (1.702 m)  Wt 265 lb (120.203 kg)  BMI 41.50 kg/m2 Gen: NAD Abd: Soft, obese, mild pelvic tenderness to palpation Pelvic: Normal appearing external genitalia; normal appearing vaginal mucosa and cervix.  Normal discharge.  Small uterus, no other palpable masses, no uterine or adnexal tenderness   ENDOMETRIAL BIOPSY     The indications for endometrial biopsy were reviewed.   Risks of the biopsy including cramping, bleeding, infection, uterine perforation, inadequate specimen and need for additional procedures  were discussed. The patient states she understands and agrees to undergo procedure today. Consent was signed. Time out was performed. Urine HCG was negative. During the pelvic exam, the cervix was prepped with Betadine. A single-toothed tenaculum was placed on the anterior lip of the cervix to stabilize it.  The 3 mm pipelle was introduced into the endometrial cavity, after some dilation with the plastic dilators, to a depth of 9 cm, and a moderate amount of tissue was obtained after two passes and sent to pathology. The instruments were  removed from the patient's vagina. Minimal bleeding from the cervix was noted. The patient tolerated the procedure well. Routine post-procedure instructions were given to the patient.    Labs and Imaging 08/10/2013   CA 125 15.3  08/18/2013 Normal pap, negative HRHPV  08/17/2013   TRANSABDOMINAL AND TRANSVAGINAL ULTRASOUND OF PELVIS  CLINICAL DATA:  Pelvic pain, left greater than right, follow-up ovarian cyst on CT TECHNIQUE: Both transabdominal and transvaginal ultrasound examinations of the pelvis were performed. Transabdominal technique was performed for global imaging of the pelvis including uterus, ovaries, adnexal regions, and pelvic cul-de-sac. It was necessary to proceed with endovaginal exam following the transabdominal exam to visualize the endometrium and bilateral adnexa.  COMPARISON:  CT abdomen pelvis dated 08/10/2013  FINDINGS: Uterus  Measurements: 8.5 x 4.2 x 5.5 cm. No fibroids or other mass visualized.  Endometrium  Thickened endometrium, measuring 19 mm, with scattered cystic endometrial foci.  Right ovary  Not discretely visualized transabdominally or transvaginally.  Left ovary  Not discretely visualized transabdominally or transvaginally.  Other findings  13.0 x 9.6 x 12.0 cm simple appearing cystic lesion in the pelvis, to the right of midline, without associated septations, soft tissue mass, or mural nodularity. No color Doppler flow.  When correlating with prior CT, this likely arises from the left ovary.  IMPRESSION: 13.0 cm cystic adnexal mass, simple. When correlating with prior CT, this likely arises from the left ovary. No overt malignant features. However, given size, surgical evaluation is suggested.  If further imaging is desired, consider MR pelvis with/without contrast.   Electronically Signed   By: Julian Hy M.D.   On:  08/17/2013 14:45   Assessment & Plan:  Patient desires surgical management with laparoscopic left salpingoophorectomy.  The risks of surgery were  discussed in detail with the patient including but not limited to: bleeding which may require transfusion or reoperation; infection which may require prolonged hospitalization or re-hospitalization and antibiotic therapy; injury to bowel, bladder, ureters and major vessels or other surrounding organs; need for additional procedures including laparotomy; thromboembolic phenomenon, incisional problems and other postoperative or anesthesia complications.  Patient was told that the likelihood that her condition and symptoms will be treated effectively with this surgical management was very high; the postoperative expectations were also discussed in detail. The patient also understands the alternative treatment options which were discussed in full. All questions were answered.  She was told that she will be contacted by our surgical scheduler regarding the time and date of her surgery; routine preoperative instructions of having nothing to eat or drink after midnight on the day prior to surgery and also coming to the hospital 1.5 hours prior to her time of surgery were also emphasized.  She was told she may be called for a preoperative appointment about a week prior to surgery and will be given further preoperative instructions at that visit. Printed patient education handouts about the procedure were given to the patient to review at home.  Of note, pain medications (percocet and Ibuprofen) refilled for patient, bleeding and pain precautions reviewed.  Patient also understands that surgery may be modified depending on pathology results from the endometrial biopsy; will follow up results and manage accordingly.     Rachel Schneiders, MD, [place] Attending [place] for [employer], [place]

## 2013-09-20 NOTE — Interval H&P Note (Signed)
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   She was seen in the [facility]U on 09/16/13 for pain, please refer to that note for more details.  Labs showed elevated blood glucose, add on hemoglobin A1C was 7.2.  08/10/2013 CA 125 15.3  08/18/2013 Normal pap smear, negative HRHPV  09/13/2013 ENDOMETRIAL BIOPSY: COMPLEX HYPERPLASIA WITH FOCAL ATYPIA   09/16/2013 EKG NSR, nonspecific ST changes. Reviewed by Dr. Royce Macadamia (Anesthesiologist)   09/16/2013 Hemoglobin A1C 7.2  09/16/2013  TRANSABDOMINAL AND TRANSVAGINAL ULTRASOUND OF PELVIS DOPPLER ULTRASOUND OF OVARIES CLINICAL DATA: Severe pelvic pain. Large cystic left adnexal lesion. Postmenopausal vaginal bleeding.  TECHNIQUE: Both transabdominal and transvaginal ultrasound examinations of the pelvis were performed. Transabdominal technique was performed for global imaging of the pelvis including uterus, ovaries, adnexal regions, and pelvic cul-de-sac. It was necessary to proceed with endovaginal exam following the transabdominal exam to visualize the endometrium and ovaries. Color and duplex Doppler ultrasound was utilized to evaluate blood flow to the ovaries. COMPARISON: 08/17/2013 FINDINGS: Uterus Measurements: 7.2 x 4.2 x 5.3 cm. No fibroids or other mass visualized. Endometrium Thickness: 13 mm. Multiple small cystic foci are seen throughout the endometrium. This is suspicious for cystic endometrial hyperplasia, although endometrial carcinoma cannot be excluded in the setting of postmenopausal bleeding. Right ovary Measurements: Not directly visualized by transabdominal or transvaginal sonography, however no adnexal mass identified. Left ovary Measurements: No normal ovarian tissue visualized. A large cystic lesion is again  seen in the left adnexa which measures 11.5 x 10.4 x 13 cm. This is not simply changed in size or appearance. This is difficult to evaluate by ultrasound due to its large size, however no definite mural nodules or internal septations are identified. Pulsed Doppler evaluation of the ovaries was attempted but was unsuccessful as there is no normal left ovarian tissue seen surrounding this cystic lesion due to its large size. Right ovary was not directly visualized could not be evaluated. Other findings No free fluid. IMPRESSION: Stable 13 cm simple appearing left adnexal cyst which has benign characteristics. This is suspicious for a cystic ovarian neoplasm in a postmenopausal female, although most likely benign. Surgical evaluation should be considered. Ovarian torsion cannot definitely be excluded as no normal ovarian tissue could be visualized to assess by Doppler. Thickened endometrium measuring 13 mm with cystic foci. Although this is suspicious for cystic endometrial hyperplasia, endometrial carcinoma cannot be excluded in this setting of postmenopausal bleeding. Endometrial sampling is recommended. Electronically Signed By: Earle Gell M.D. On: 09/16/2013 12:23   08/17/2013 CLINICAL DATA: Pelvic pain, left greater than right, follow-up ovarian cyst on CT EXAM: TRANSABDOMINAL AND TRANSVAGINAL ULTRASOUND OF PELVIS TECHNIQUE: Both transabdominal and transvaginal ultrasound examinations of the pelvis were performed. Transabdominal technique was performed for global imaging of the pelvis including uterus, ovaries, adnexal regions, and pelvic cul-de-sac. It was necessary to proceed with endovaginal exam following the transabdominal exam to visualize the endometrium and bilateral adnexa. COMPARISON: CT abdomen pelvis dated 08/10/2013 FINDINGS: Uterus Measurements: 8.5 x 4.2 x 5.5 cm. No fibroids or other mass visualized. Endometrium Thickened endometrium, measuring 19 mm, with scattered cystic endometrial foci. Right  ovary Not discretely visualized transabdominally or transvaginally. Left ovary Not discretely visualized transabdominally or transvaginally. Other findings 13.0 x 9.6 x 12.0 cm simple appearing cystic lesion in the pelvis, to  the right of midline, without associated septations, soft tissue mass, or mural nodularity. No color Doppler flow. When correlating with prior CT, this likely arises from the left ovary. IMPRESSION: 13.0 cm cystic adnexal mass, simple. When correlating with prior CT, this likely arises from the left ovary. No overt malignant features. However, given size, surgical evaluation is suggested. If further imaging is desired, consider MR pelvis with/without contrast. Electronically Signed By: Julian Hy M.D. On: 08/17/2013 14:45    Plan: Patient has a history of obesity, DM, HTN and is scheduled for LAVH, BSO; possible TAH/BSO for 13 cm ovarian cyst and complex atypical endometrial hyperplasia.  The risks of surgery were discussed in detail with the patient including but not limited to: bleeding which may require transfusion or reoperation; infection which may require antibiotics; injury to bowel, bladder, ureters or other surrounding organs; need for additional procedures including laparotomy; thromboembolic phenomenon, incisional problems and other postoperative/anesthesia complications. Risks of neoplasia was explained to the patient; this will be verified on final pathologic analysis and may require further procedures.   Diagnosis of DM was shared with the patient; will have DM educator consult with patient after surgery.  Patient was also advised that she will remain in house for 1-2 days; and expected recovery time after a hysterectomy is 6-8 weeks. Likelihood of success in alleviating the patient's symptoms was discussed. Routine postoperative instructions will be reviewed with the patient and her family in detail after surgery. After consideration of risks, benefits and other options  for treatment, the patient has consented to War with bilateral salpingectomy and oophorectomy; possible TAH/BSO as a surgical intervention.  The patient's history has been reviewed, patient examined, no change in status, stable for surgery.  I have reviewed the patient's chart and labs.  Questions were answered to the patient's satisfaction.  To OR when ready.   Verita Schneiders, MD, [place] Attending [place], [facility]

## 2013-09-20 NOTE — Op Note (Signed)
Rachel Herman PROCEDURE DATE: 09/20/2013  PREOPERATIVE DIAGNOSES:  13 cm left ovarian cyst, complex atypical endometrial hyperplasia, postmenopausal bleeding, morbid obesity POSTOPERATIVE DIAGNOSES:  The same, omental thickening SURGEON:   Verita Schneiders, M.D. ASSISTANT: Lavonia Drafts*, M.D. OPERATION:  Total abdominal hysterectomy, Bilateral Salpingoohorectomy, Biopsies of the omentum, JP drain placement ANESTHESIA:  General endotracheal.  INDICATIONS: The patient is a 63 y.o. [id number] with the aforementioned diagnoses who desires definitive surgical management. On the preoperative visit, the risks, benefits, indications, and alternatives of the procedure were reviewed with the patient.  On the day of surgery, the risks of surgery were again discussed with the patient including but not limited to: bleeding which may require transfusion or reoperation; infection which may require antibiotics; injury to bowel, bladder, ureters or other surrounding organs; need for additional procedures; thromboembolic phenomenon, incisional problems and other postoperative/anesthesia complications. Risks of neoplasia was explained to the patient; this will be verified on final pathologic analysis and may require further procedures. Written informed consent was obtained.    OPERATIVE FINDINGS: 13 cm enlarged torsed left adnexa; entire ovary appeared hemorrhagic.  Sections of the omentum were noted to be thickened and edematous, section obtained for biopsy.  Small 1.5 cm omental cyst also noted; this was also resected.  A 7 week size uterus with normal right fallopian tubes and ovary.  Patient had excess intraperitoneal adipose tissue, no other anomalies seen.  Liver was palpated and had irregular surface concerning for fatty infiltration or other etiology.  Intraperitoneal JP drain placed at the end of the case and brought out on the right side.  ESTIMATED BLOOD LOSS: 600 ml FLUIDS:  2000 ml of Lactated  Ringers URINE OUTPUT:  400 ml of clear yellow urine. SPECIMENS:  Uterus,cervix, bilateral fallopian tubes and ovaries (including large left ovary), omental cyst and biopsy sent to pathology COMPLICATIONS:  None immediate.  DESCRIPTION OF PROCEDURE:  The patient received intravenous antibiotics and had sequential compression devices applied to her lower extremities while in the preoperative area.   She was taken to the operating room and placed under general anesthesia without difficulty.The abdomen and perineum were prepped and draped in a sterile manner, and she was placed in a dorsal supine position.  A Foley catheter was inserted into the bladder and attached to constant drainage. After an adequate timeout was performed, a Pfannensteil skin incision was made. This incision was taken down to the fascia using electrocautery with care given to maintain good hemostasis. The fascia was incised in the midline and the fascial incision was then extended bilaterally using electrocautery without difficulty. The fascia was then dissected off the underlying rectus muscles using blunt and sharp dissection. The rectus muscles were split bluntly in the midline and the peritoneum entered sharply without complication. This peritoneal incision was then extended superiorly and inferiorly with care given to prevent bowel or bladder injury. Attention was then turned to the pelvis.  The enlarged and torsed left ovary was noted; this was untorsed and delivered up out of the pelvis.  The infundibulopelvic ligament was then clamped cut, and doubly suture ligated.  The thickened omentum was noted and the omentum cyst; this cyst and part of the omentum was resected and the pedicles were ligated.  A retractor was placed into the incision, and the bowel was packed away with moist laparotomy sponges. The uterus at this point was noted to be mobilized.  The attenuated round ligaments on each side were clamped, suture ligated with 0  Vicryl, and transected  with electrocautery allowing entry into the broad ligament. Of note, all sutures used in this procedure are 0 Vicryl unless otherwise noted. The anterior and posterior leaves of the broad ligament were separated, and the ureters were inspected to be safely away from the area of dissection bilaterally.  Adnexae were clamped on the patient's right side, cut, and doubly suture ligated. A hole was created in the clear portion of the posterior broad ligament and the infundibulopelvic ligament was then clamped cut, and doubly suture ligated allowing for salpingoophorectomy. A bladder flap was then created.  The bladder was then bluntly dissected off the lower uterine segment and cervix with good hemostasis noted. The uterine arteries were then skeletonized bilaterally and then clamped, cut, and doubly suture ligated with care given to prevent ureteral injury.   The uterosacral ligaments were then clamped, cut, and ligated bilaterally.  Finally, the cardinal ligaments were clamped, cut, and ligated bilaterally.  An incision was made just underneath the cervix into the vagina, then the cervix was excised from the vagina circumferentially using Jorgensen's scissors.  The vaginal cuff was closed with a series of interrupted figure-of-eight sutures with care given to incorporate the anterior pubocervical fascia and the posterior rectovaginal fascia.  It was difficult to close the vaginal cuff given that the patient had a very deep pelvis.  The pelvis was irrigated and no active bleeding was noted.  All laparotomy sponges and instruments were removed from the abdomen. An intraperitoneal JP drain was placed and brought out on the right side.  The peritoneum was closed with two interrupted stitches, and the fascia was also closed in a running fashion. The subcutaneous layer was reapproximated with 0 Vicryl. The skin was closed with a 4-0 Vicryl subcuticular stitch. Sponge, lap, needle, and instrument counts  were correct times two. The patient was taken to the recovery area awake, extubated and in stable condition.    Verita Schneiders, MD, [place] Attending [place], [facility]

## 2013-09-20 NOTE — Anesthesia Preprocedure Evaluation (Addendum)
Anesthesia Evaluation  Patient identified by MRN, date of birth, ID band Patient awake    Reviewed: Allergy & Precautions, H&P , Patient's Chart, lab work & pertinent test results, reviewed documented beta blocker date and time   Airway Mallampati: III TM Distance: >3 FB Neck ROM: full   Comment: Very Large Neck. Adequate aperture  Dental no notable dental hx.    Pulmonary  breath sounds clear to auscultation  Pulmonary exam normal       Cardiovascular hypertension, On Medications Rhythm:regular Rate:Normal     Neuro/Psych    GI/Hepatic   Endo/Other  diabetes, Type 2  Renal/GU      Musculoskeletal   Abdominal   Peds  Hematology   Anesthesia Other Findings   Reproductive/Obstetrics                          Anesthesia Physical Anesthesia Plan  ASA: III  Anesthesia Plan: General   Post-op Pain Management:    Induction: Intravenous  Airway Management Planned: Oral ETT  Additional Equipment:   Intra-op Plan:   Post-operative Plan: Extubation in OR  Informed Consent: I have reviewed the patients History and Physical, chart, labs and discussed the procedure including the risks, benefits and alternatives for the proposed anesthesia with the patient or authorized representative who has indicated his/her understanding and acceptance.   Dental Advisory Given and Dental advisory given  Plan Discussed with: CRNA and Surgeon  Anesthesia Plan Comments: (Poor dentition, dental advisory stressed. Patient is perhaps withdrawn or scared. Most answers were single syllable. No Hx od Heart Sx implying CAD ( pt is diabetic). Her EKG shows Twave abn in V4-5. Recently put on BP meds per Dr Harolyn Rutherford.  Discussed general anesthesia, including possible nausea, instrumentation of airway, sore throat,pulmonary aspiration, etc. I asked if the were any outstanding questions, or  concerns before we proceeded. )        Anesthesia Quick Evaluation

## 2013-09-20 NOTE — Transfer of Care (Signed)
Immediate Anesthesia Transfer of Care Note  Patient: Rachel Herman  Procedure(s) Performed: Procedure(s) with comments: HYSTERECTOMY ABDOMINAL (N/A) SALPINGO OOPHORECTOMY LAPAROSCOPIC ASSISTED VAGINAL HYSTERECTOMY - PT WAS EXAMINED WHILE UP IN STIRRUPS AND IT WAS DECIDED TO OPEN PT DUE TO LARGE MASS  Patient Location: PACU  Anesthesia Type:General  Level of Consciousness: awake, alert  and oriented  Airway & Oxygen Therapy: Patient Spontanous Breathing and Patient connected to face mask oxygen  Post-op Assessment: Report given to PACU RN, Post -op Vital signs reviewed and stable and Patient moving all extremities X 4  Post vital signs: Reviewed and stable  Complications: No apparent anesthesia complications

## 2013-09-20 NOTE — Anesthesia Postprocedure Evaluation (Signed)
  Anesthesia Post-op Note  Patient: Rachel Herman  Procedure(s) Performed: Procedure(s) with comments: HYSTERECTOMY ABDOMINAL (N/A) SALPINGO OOPHORECTOMY LAPAROSCOPIC ASSISTED VAGINAL HYSTERECTOMY - PT WAS EXAMINED WHILE UP IN STIRRUPS AND IT WAS DECIDED TO OPEN PT DUE TO LARGE MASS  Patient Location: PACU  Anesthesia Type:General  Level of Consciousness: awake, oriented and sedated  Airway and Oxygen Therapy: Patient Spontanous Breathing and Patient connected to nasal cannula oxygen  Post-op Pain: mild  Post-op Assessment: Post-op Vital signs reviewed, Patient's Cardiovascular Status Stable, Respiratory Function Stable, Patent Airway, No signs of Nausea or vomiting and Pain level controlled  Post-op Vital Signs: Reviewed and stable   Last Vitals:  Filed Vitals:   09/20/13 1515  BP: 142/86  Pulse: 82  Temp:   Resp: 17    Complications: No apparent anesthesia complications

## 2013-09-21 ENCOUNTER — Ambulatory Visit: Payer: Self-pay | Admitting: Internal Medicine

## 2013-09-21 ENCOUNTER — Encounter (HOSPITAL_COMMUNITY): Payer: Self-pay | Admitting: Obstetrics & Gynecology

## 2013-09-21 LAB — CBC
HCT: 35.1 % — ABNORMAL LOW (ref 36.0–46.0)
Hemoglobin: 11.6 g/dL — ABNORMAL LOW (ref 12.0–15.0)
MCH: 29.4 pg (ref 26.0–34.0)
MCHC: 33 g/dL (ref 30.0–36.0)
MCV: 89.1 fL (ref 78.0–100.0)
Platelets: 273 10*3/uL (ref 150–400)
RBC: 3.94 MIL/uL (ref 3.87–5.11)
RDW: 13.9 % (ref 11.5–15.5)
WBC: 15.1 10*3/uL — ABNORMAL HIGH (ref 4.0–10.5)

## 2013-09-21 LAB — GLUCOSE, CAPILLARY
Glucose-Capillary: 154 mg/dL — ABNORMAL HIGH (ref 70–99)
Glucose-Capillary: 167 mg/dL — ABNORMAL HIGH (ref 70–99)
Glucose-Capillary: 139 mg/dL — ABNORMAL HIGH (ref 70–99)
Glucose-Capillary: 165 mg/dL — ABNORMAL HIGH (ref 70–99)

## 2013-09-21 LAB — COMPREHENSIVE METABOLIC PANEL
ALT: 21 U/L (ref 0–35)
AST: 19 U/L (ref 0–37)
Albumin: 2.5 g/dL — ABNORMAL LOW (ref 3.5–5.2)
Alkaline Phosphatase: 52 U/L (ref 39–117)
Anion gap: 10 (ref 5–15)
BUN: 15 mg/dL (ref 6–23)
CO2: 33 mEq/L — ABNORMAL HIGH (ref 19–32)
Calcium: 7.8 mg/dL — ABNORMAL LOW (ref 8.4–10.5)
Chloride: 94 mEq/L — ABNORMAL LOW (ref 96–112)
Creatinine, Ser: 0.78 mg/dL (ref 0.50–1.10)
GFR calc Af Amer: >90 mL/min (ref >90)
GFR calc non Af Amer: 87 mL/min — ABNORMAL LOW (ref >90)
Glucose, Bld: 145 mg/dL — ABNORMAL HIGH (ref 70–99)
Potassium: 3.9 mEq/L (ref 3.7–5.3)
Sodium: 137 mEq/L (ref 137–147)
Total Bilirubin: 0.6 mg/dL (ref 0.3–1.2)
Total Protein: 5.8 g/dL — ABNORMAL LOW (ref 6.0–8.3)

## 2013-09-21 MED ORDER — LIVING WELL WITH DIABETES BOOK
Freq: Once | Status: AC
  Administered 2013-09-21: 1
  Filled 2013-09-21: qty 1

## 2013-09-21 NOTE — Progress Notes (Signed)
UR completed 

## 2013-09-21 NOTE — Addendum Note (Signed)
Addendum created 09/21/13 0848 by Georgeanne Nim, CRNA   Modules edited: Notes Section   Notes Section:  Pend: [phone]

## 2013-09-21 NOTE — Progress Notes (Signed)
  RD consulted for nutrition education regarding diabetes.   Lab Results  Component Value Date   HGBA1C 7.2* 09/16/2013    RD provided "Carbohydrate Counting for People with Diabetes" handout from the Academy of [employer] and Dietetics. Discussed different food groups and their effects on blood sugar, emphasizing carbohydrate-containing foods. Provided list of carbohydrates and recommended serving sizes of common foods.  Discussed importance of controlled and consistent carbohydrate intake throughout the day. Provided examples of ways to balance meals/snacks and encouraged intake of high-fiber, whole grain complex carbohydrates. Teach back method used.  Expect good compliance.  Body mass index is 41.5 kg/(m^2). Pt meets criteria for obesity III based on current BMI.  Current diet order is CHO modified. Labs and medications reviewed. No further nutrition interventions warranted at this time. RD contact information provided. If additional nutrition issues arise, please re-consult RD.  Pt will benefit from outpt Follow-up. Husband is diabetic and would benefit from the education also  Weyman Rodney M.Fredderick Severance LDN Neonatal Nutrition Support Specialist/RD III Pager [phone]

## 2013-09-21 NOTE — Anesthesia Postprocedure Evaluation (Signed)
  Anesthesia Post-op Note  Patient: Rachel Herman  Procedure(s) Performed: Procedure(s) with comments: HYSTERECTOMY ABDOMINAL (N/A) SALPINGO OOPHORECTOMY LAPAROSCOPIC ASSISTED VAGINAL HYSTERECTOMY - PT WAS EXAMINED WHILE UP IN STIRRUPS AND IT WAS DECIDED TO OPEN PT DUE TO LARGE MASS  Patient Location: [facility]  Anesthesia Type:General  Level of Consciousness: awake, alert , oriented and patient cooperative  Airway and Oxygen Therapy: Patient Spontanous Breathing and Patient connected to nasal cannula oxygen 2.5L/min with SaO2 90-91%;improved to 95% with repositioning to head of bed, elevation and coughing encouraged. EtCO2 not connected but RN reports readings in 30s during the night.  Post-op Pain: moderate;  Post-op Assessment: Patient's Cardiovascular Status Stable and Respiratory Function Stable   Post-op Vital Signs: stable  Last Vitals:  Filed Vitals:   09/21/13 0530  BP: 148/81  Pulse: 103  Temp: 37.4 C  Resp: 20    Complications: No apparent anesthesia complications

## 2013-09-21 NOTE — Progress Notes (Signed)
1 Day Post-Op Procedure(s) (LRB): HYSTERECTOMY ABDOMINAL BILATERAL SALPINGO OOPHORECTOMY  Subjective: Patient reports incisional pain.  Has tolerated some broth, ambulated last evening. No flatus yet.  RN reports that patient's baseline O2 sats are in the low 90s, suspects undiagnosed COPD or other restrictive lung disease.  Patient has been trying to use her incentive spirometer, but only able to go to about 300 on the meter; goal is about 750.    Objective: I have reviewed patient's vital signs, intake and output, medications and labs. Temp:  [97.8 F (36.6 C)-99.7 F (37.6 C)] 99.4 F (37.4 C) (08/18 0530) Pulse Rate:  [81-107] 103 (08/18 0530) Resp:  [0-24] 20 (08/18 0530) BP: (120-163)/(57-91) 148/81 mmHg (08/18 0530) SpO2:  [86 %-100 %] 86 % (08/18 0700) Weight:  [265 lb (120.203 kg)] 265 lb (120.203 kg) (08/17 1615) I/O last 3 completed shifts: In: 5793.9 [P.O.:1320; I.V.:4473.9] Out: 2213 [KDTOI:7124; Drains:138; Blood:600]  General: alert and no distress Resp: clear to auscultation anteriorly, decreased breath sound at bases Cardio: regular rate and rhythm GI:  symmetric, absent bowel sounds, distended, obese, moderate and diffuse tenderness in the entire abdomen.  Incision: clean, dry, intact and bloody drainage present around JP site Extremities: extremities normal, atraumatic, no cyanosis or edema and Homans sign is negative, no sign of DVT Vaginal Bleeding: minimal  CBC Latest Ref Rng 09/21/2013 09/20/2013 09/16/2013  WBC 4.0 - 10.5 K/uL 15.1(H) 15.6(H) 11.0(H)  Hemoglobin 12.0 - 15.0 g/dL 11.6(L) 13.9 14.4  Hematocrit 36.0 - 46.0 % 35.1(L) 41.7 42.9  Platelets 150 - 400 K/uL 273 283 253   CMP  NA 137  K 3.9  CL 94*  CO2 33*  GLUCOSE 145*  BUN 15  CREATININE 0.78  CALCIUM 7.8*  PROT 5.8*  ALBUMIN 2.5*  AST 19  ALT 21  ALKPHOS 73  BILITOT 0.6  GFRNONAA 87*  GFRAA >90      Assessment:  POD#1 s/p TAH/BSO for torsed left adnexa and complex atypical  endometrial hyperplasia.  Stable, concerned about ileus.  Also worried about her respiratory status.; also treating her HTN and newly diagnosed DM.  Plan: Clear liquids for now, advance diet when bowel sounds improve. Concerned about possible ileus, gastroparesis Encourage ambulation Continue incentive spirometry and overall pulmonary hygiene. Consult RT if necessary. DM coordinator to see patient soon and give education about management of DM Continue antihypertensives Routine postoperative care    LOS: 1 day    Osborne Oman, MD 09/21/2013, 8:00 AM

## 2013-09-21 NOTE — Progress Notes (Signed)
Inpatient Diabetes Program Recommendations  AACE/ADA: New Consensus Statement on Inpatient Glycemic Control (2013)  Target Ranges:  Prepandial:   less than 140 mg/dL      Peak postprandial:   less than 180 mg/dL (1-2 hours)      Critically ill patients:  140 - 180 mg/dL   Results for Rachel Herman, Rachel Herman (MRN [id number]) as of 09/21/2013 12:22  Ref. Range 09/20/2013 13:34 09/20/2013 21:57 09/21/2013 08:56  Glucose-Capillary Latest Range: 70-99 mg/dL 169 (H) 188 (H) 154 (H)   Results for Rachel Herman, Rachel Herman (MRN [id number]) as of 09/21/2013 12:22  Ref. Range 09/16/2013 10:00  Hemoglobin A1C Latest Range: <5.7 % 7.2 (H)   Diabetes history: NO Outpatient Diabetes medications: NA Current orders for Inpatient glycemic control: None  Inpatient Diabetes Program Recommendations Correction (SSI): Please consider ordering CBGs with Novolog sensitive correction scale Q4H (using Glycemic Control Order Set).  Note: Spoke with patient and her family (patient gave permission to discuss diabetes with family in room) about new diabetes diagnosis. Discussed A1C results (7.2%) and explained what an A1C is, basic pathophysiology of DM Type 2, basic home care, importance of checking CBGs and maintaining good CBG control to prevent long-term and short-term complications. Reviewed signs and symptoms of hyperglycemia and hypoglycemia along with treatment for both. Prior to admission, patient was not following any type of diet and reports that she has never watched amount of carbohydrates eaten. Discussed carbohydrates, carbohydrate goals per day and per meal, and discussed portion control. In talking with the patient she reports that she does not have a PCP or insurance. She plans to follow up at the [facility]. Patient can get a glucometer and testing supplies from the clinic if MD will fax over prescription for glucometer and testing supplies prior to discharge. Reviewed Living Well with Diabetes Booklet  with the patient and discussed monitoring glucose. Asked that patient check her glucose 1-2 times per day and keep a log of glucose values so she can take them with her to her follow up visit at the clinic. RNs to provide ongoing basic DM education at bedside with this patient and engage patient to actively check blood glucose. Have ordered educational booklet, DM videos, and RD consult.   Thanks, Barnie Alderman, RN, MSN, CCRN Diabetes Coordinator Inpatient Diabetes Program [phone] (Team Pager) [phone] ([facility] office) [phone] [facility] office)

## 2013-09-21 NOTE — Care Management Note (Addendum)
    Page 1 of 2   09/24/2013     11:09:31 AM CARE MANAGEMENT NOTE 09/24/2013  Patient:  Rachel Herman, Rachel Herman   Account Number:  [id number]  Date Initiated:  09/21/2013  Documentation initiated by:  CRAFT,TERRI  Subjective/Objective Assessment:   63 year old female admitted 09/20/13 S/P TAH/BSO     Action/Plan:   D/C when medically stable   Anticipated DC Date:  09/24/2013   Anticipated DC Plan:  HOME/SELF CARE  In-house referral  Nutrition      DC Planning Services  CM consult  PCP issues      Choice offered to / List presented to:             Status of service:  In process, will continue to follow Medicare Important Message given?   (If response is "NO", the following Medicare IM given date fields will be blank) Date Medicare IM given:   Medicare IM given by:   Date Additional Medicare IM given:   Additional Medicare IM given by:    Discharge Disposition:    Per UR Regulation:  Reviewed for med. necessity/level of care/duration of stay  If discussed at [place] of Stay Meetings, dates discussed:    Comments:  09/24/13 [facility] BSN # [phone]  0900 - patient discharged today with plans to go to the [facility] with [facility] to obtain her prescriptions today and patient instructed to get an appointment while she is over there for the next 1-2 weeks for a PCP.  Patient verbalized understanding.  09/23/13 1300 L. Arvella [facility] BSN # [phone]- CM spoke to patient in room and gave patient the phone numer to the [facility]ness center.  CM called Jamilla at [phone] and left message to schedule appt for patient.  CM also called the main number [phone] and left message to get appointment for patient.  Awaiting call back. Patient's medications of Metformin and HCTZ can be purchased at the [facility]) fo$r 4 each and patient verbalized understanding and that she can afford those medications. CM spoke to Dr. Harolyn Rutherford and  she is aware with plan and that appointment was to be followed up with by patient unless we hear back from the [facility]  them prior to discharge.   09/21/13, Aida Raider RNC-MNN, BSN, [phone], CM received referral from Diabetic Educator to schedule an appt for pt at [place] for PCP follow up.  CM called Environmental education officer Jamilla at [phone] and left message to schedule appt for pt.  CM met with pt and discussed above information.  CM also gave pt 3 pages of resources fpr PCP.  Pt states she has had several appts. at the [facility] and unable to make appt-unclear if appt was cancelled or if pt was a no-show. Discussed with pt and pt's RN all above information and that we are awaiting return call to schedule appt at [facility] and [facility].  Pt aware she may need to be seen by another PCP by scheduling an appt if [place] and Wellness unable to see her-pt has list ( 3 pages) in which to choose from.  Will continue to follow.

## 2013-09-21 NOTE — Addendum Note (Signed)
Addendum created 09/21/13 1048 by Georgeanne Nim, CRNA   Modules edited: Notes Section   Notes Section:  File: [id number]

## 2013-09-22 LAB — GLUCOSE, CAPILLARY
Glucose-Capillary: 105 mg/dL — ABNORMAL HIGH (ref 70–99)
Glucose-Capillary: 115 mg/dL — ABNORMAL HIGH (ref 70–99)
Glucose-Capillary: 118 mg/dL — ABNORMAL HIGH (ref 70–99)
Glucose-Capillary: 119 mg/dL — ABNORMAL HIGH (ref 70–99)
Glucose-Capillary: 124 mg/dL — ABNORMAL HIGH (ref 70–99)
Glucose-Capillary: 144 mg/dL — ABNORMAL HIGH (ref 70–99)
Glucose-Capillary: 145 mg/dL — ABNORMAL HIGH (ref 70–99)

## 2013-09-22 MED ORDER — INSULIN ASPART 100 UNIT/ML ~~LOC~~ SOLN
0.0000 [IU] | SUBCUTANEOUS | Status: DC
  Administered 2013-09-22: 1 [IU] via SUBCUTANEOUS

## 2013-09-22 MED ORDER — ACCU-CHEK FASTCLIX LANCETS MISC
1.0000 [IU] | Freq: Four times a day (QID) | Status: DC
Start: 2013-09-22 — End: 2015-09-11

## 2013-09-22 MED ORDER — ACCU-CHEK NANO SMARTVIEW W/DEVICE KIT: 1.0000 | PACK | Status: DC

## 2013-09-22 MED ORDER — SIMETHICONE 80 MG PO CHEW
80.0000 mg | CHEWABLE_TABLET | Freq: Four times a day (QID) | ORAL | Status: DC
  Administered 2013-09-22 – 2013-09-24 (×8): 80 mg via ORAL
  Filled 2013-09-22 (×8): qty 1

## 2013-09-22 MED ORDER — HYDROMORPHONE HCL 2 MG PO TABS
2.0000 mg | ORAL_TABLET | ORAL | Status: DC | PRN
  Administered 2013-09-22 (×4): 2 mg via ORAL
  Administered 2013-09-22: 4 mg via ORAL
  Administered 2013-09-23 – 2013-09-24 (×7): 2 mg via ORAL
  Filled 2013-09-22 (×4): qty 1
  Filled 2013-09-22: qty 2
  Filled 2013-09-22 (×7): qty 1

## 2013-09-22 MED ORDER — GLUCOSE BLOOD VI STRP: ORAL_STRIP | Status: DC

## 2013-09-22 MED ORDER — INSULIN ASPART 100 UNIT/ML ~~LOC~~ SOLN: 0.0000 [IU] | SUBCUTANEOUS | Status: DC

## 2013-09-22 NOTE — Progress Notes (Signed)
Reviewed c pt the Education Program,  Assisted her in watching the videos r/t Diabetes Management.  She wasn't that enthusiastic about the process, but I reinforced how her low carb diet here has been keeping her glucose levels under 120 today.

## 2013-09-22 NOTE — Progress Notes (Signed)
2 Days Post-Op Procedure(s) (LRB): HYSTERECTOMY ABDOMINAL BILATERAL SALPINGO OOPHORECTOMY  Subjective: Patient reports incisional pain.  Has tolerated some full liquids, ambulated yesterday multiple times. No flatus yet.  Was seen by the DM educator yesterday.   Objective: I have reviewed patient's vital signs, intake and output, medications and labs. Temp:  [98.3 F (36.8 C)-99.4 F (37.4 C)] 98.9 F (37.2 C) (08/19 0200) Pulse Rate:  [87-103] 87 (08/19 0200) Resp:  [18-20] 20 (08/19 0200) BP: (132-153)/(64-90) 133/79 mmHg (08/19 0200) SpO2:  [86 %-96 %] 96 % (08/19 0200) FiO2 (%):  [28 %] 28 % (08/19 0200)  08/18 0701 - 08/19 0700 In: 1380 [P.O.:1080; I.V.:300] Out: 1492 [Urine:1450; Drains:42]  General: alert and no distress Resp: clear to auscultation anteriorly, decreased breath sound at bases Cardio: regular rate and rhythm GI:  symmetric, absent bowel sounds, distended, obese, moderate and diffuse tenderness in the entire abdomen.  Incision: clean, dry, intact and bloody drainage present around JP site Extremities: extremities normal, atraumatic, no cyanosis or edema and Homans sign is negative, no sign of DVT Vaginal Bleeding: minimal  Results for Rachel, Herman (MRN [id number]) as of 09/22/2013 04:24  Ref. 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  Benign adnexa bilaterally.  Benign omental cyst and biopsy.  Report reviewed with patient.   Assessment:  POD#2 s/p TAH/BSO for  torsed 13 cm left adnexa and complex atypical endometrial hyperplasia.  Stable, improved ileus.  Also treating her HTN and newly diagnosed DM.  Plan: Advance diet as tolerated Encourage continued ambulation, incentive spirometry and overall pulmonary hygiene.  Continue antihypertensive therapy Appreciate the recommendations from the DM educator and Nutrition consults; patient is getting CBGs every four hours and is covered on Novolog sensitive correction scale.  She is also on a carbohydrate modified diet. Moreover, electronic prescriptions have been sent to [facility] for her glucometer, test strips and lancets as instructed to expedite obtaining outpatient testing supplies for patient. Clinic appointment will be made for patient at time of discharge at the [facility].  Continue routine postoperative care.    LOS: 2 days   Rachel Schneiders, MD, [place] Attending [place], [facility]

## 2013-09-22 NOTE — Progress Notes (Signed)
2 Days Post-Op Procedure(s) (LRB): HYSTERECTOMY ABDOMINAL BILATERAL SALPINGO OOPHORECTOMY  Subjective: Patient reports incisional and abdominal pain.  Has tolerated some full liquids, ambulated/OOB yesterday multiple times. No flatus yet.  Was seen by the DM educator yesterday.   Objective: I have reviewed patient's vital signs, intake and output, medications and labs. Temp:  [97.9 F (36.6 C)-98.9 F (37.2 C)] 97.9 F (36.6 C) (08/19 1308) Pulse Rate:  [87-99] 95 (08/19 0608) Resp:  [18-20] 20 (08/19 0608) BP: (132-153)/(64-90) 152/86 mmHg (08/19 0608) SpO2:  [90 %-99 %] 99 % (08/19 0608) FiO2 (%):  [28 %] 28 % (08/19 0200)  08/18 0701 - 08/19 0700 In: 1500 [P.O.:1200; I.V.:300] Out: 6578 [Urine:1750; Drains:42]  General: alert and no distress Resp: clear to auscultation anteriorly, decreased breath sound at bases Cardio: regular rate and rhythm GI:  symmetric, bowel sounds present, distended, obese, moderate and diffuse tenderness in the entire abdomen.  Incision: clean, dry, intact and bloody drainage present around JP site Extremities: extremities normal, atraumatic, no cyanosis or edema and Homans sign is negative, no sign of DVT Vaginal Bleeding: minimal  Results for Rachel Herman, Rachel Herman (MRN [id number]) as of 09/22/2013 07:13  Ref. 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  Benign adnexa bilaterally.  Benign omental cyst and biopsy.  Report reviewed  with patient.   Assessment:  POD#2 s/p TAH/BSO for torsed 13 cm left adnexa and complex atypical endometrial hyperplasia.  Stable, improving ileus.  Also treating her HTN and newly diagnosed DM.  Plan: Advance diet as tolerated Encourage continued ambulation, incentive spirometry and overall pulmonary hygiene.  Continue antihypertensive therapy Analgesic changed to oral Dilaudid; simethicone ordered Appreciate the recommendations from the DM educator and Nutrition consults; patient is getting CBGs every four hours and is covered on Novolog sensitive correction scale.  She is also on a carbohydrate modified diet. Moreover, electronic prescriptions have been sent to [facility] for her glucometer, test strips and lancets as instructed to expedite obtaining outpatient testing supplies for patient. Clinic appointment will be made for patient at time of discharge at the [facility].  Continue routine postoperative care.    LOS: 2 days   Rachel Schneiders, MD, [place] Attending [place], [facility]

## 2013-09-23 LAB — GLUCOSE, CAPILLARY
Glucose-Capillary: 110 mg/dL — ABNORMAL HIGH (ref 70–99)
Glucose-Capillary: 118 mg/dL — ABNORMAL HIGH (ref 70–99)
Glucose-Capillary: 117 mg/dL — ABNORMAL HIGH (ref 70–99)
Glucose-Capillary: 152 mg/dL — ABNORMAL HIGH (ref 70–99)

## 2013-09-23 MED ORDER — METFORMIN HCL 500 MG PO TABS: 500.0000 mg | ORAL_TABLET | Freq: Two times a day (BID) | ORAL | Status: DC

## 2013-09-23 MED ORDER — HYDROMORPHONE HCL 2 MG PO TABS: 2.0000 mg | ORAL_TABLET | ORAL | Status: DC | PRN

## 2013-09-23 MED ORDER — PROMETHAZINE HCL 25 MG PO TABS: 25.0000 mg | ORAL_TABLET | Freq: Four times a day (QID) | ORAL | Status: DC | PRN

## 2013-09-23 MED ORDER — DSS 100 MG PO CAPS: 100.0000 mg | ORAL_CAPSULE | Freq: Two times a day (BID) | ORAL | Status: DC

## 2013-09-23 MED ORDER — IBUPROFEN 600 MG PO TABS: 600.0000 mg | ORAL_TABLET | Freq: Four times a day (QID) | ORAL | Status: DC | PRN

## 2013-09-23 MED ORDER — HYDROCHLOROTHIAZIDE 25 MG PO TABS: 25.0000 mg | ORAL_TABLET | Freq: Every day | ORAL | Status: DC

## 2013-09-23 MED ORDER — METFORMIN HCL 500 MG PO TABS
500.0000 mg | ORAL_TABLET | Freq: Two times a day (BID) | ORAL | Status: DC
  Administered 2013-09-23 – 2013-09-24 (×3): 500 mg via ORAL
  Filled 2013-09-23 (×3): qty 1

## 2013-09-23 NOTE — Progress Notes (Signed)
CBG check AC dinner not done.  Patient already ate and just finished as this nurse made first rounds after endorsement.  Will be checking CBG at bedtime.

## 2013-09-23 NOTE — Progress Notes (Signed)
3 Days Post-Op Procedure(s) (LRB): HYSTERECTOMY ABDOMINAL BILATERAL SALPINGO OOPHORECTOMY  Subjective: Patient reports decreased incisional and abdominal pain.  Has tolerated liquid diet only, ambulated/OOB yesterday multiple times. Multiple episodes of flatus since last night, no BM.   Objective: I have reviewed patient's vital signs, intake and output, medications and labs. Temp:  [97.7 F (36.5 C)-98.4 F (36.9 C)] 98.4 F (36.9 C) (08/20 1100) Pulse Rate:  [74-102] 102 (08/20 1100) Resp:  [20-22] 20 (08/20 1100) BP: (115-159)/(60-87) 126/69 mmHg (08/20 1100) SpO2:  [92 %-100 %] 98 % (08/20 1100)  08/19 0701 - 08/20 0700 In: 4423.3 [P.O.:2140; I.V.:2280.3] Out: 3562 [Urine:3550; Drains:12]  General: alert and no distress Resp: clear to auscultation anteriorly, decreased breath sound at bases Cardio: regular rate and rhythm GI:  symmetric, bowel sounds present, distended, obese, moderate and diffuse tenderness in the entire abdomen.  Incision: clean, dry, intact and bloody drainage present around JP site Extremities: extremities normal, atraumatic, no cyanosis or edema and Homans sign is negative, no sign of DVT Vaginal Bleeding: minimal  Results for ADHIRA, JAMIL (MRN [id number]) as of 09/23/2013 12:14  Ref. 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  Benign adnexa bilaterally.  Benign omental cyst and biopsy.  Report reviewed with  patient.   Assessment:  POD#3 s/p TAH/BSO for torsed 13 cm left adnexa and complex atypical endometrial hyperplasia.  Stable, improving ileus.  Also treating her HTN and newly diagnosed DM.  Plan: Advance diet toas tolerated Encourage continued ambulation, incentive spirometry and overall pulmonary hygiene.  Continue antihypertensive therapy with HCTZ Continue checking blood sugars, will swithc frquency to ac and qhs.  Discontinue Insulin, and start Metformin 500 mg po bid. Analgesic changed to oral Dilaudid Clinic appointment will be made for patient at time of discharge at the [facility].  Continue routine postoperative care. Plan for discharge to home tomorrow; Case Management involved in disposition planning.    LOS: 3 days   Verita Schneiders, MD, [place] Attending [place], [facility]

## 2013-09-23 NOTE — Progress Notes (Signed)
09/23/13 1300  Clinical Encounter Type  Visited With Patient and family together (husband Rachel Herman)  Visit Type Spiritual support;Social support  Referral From Nurse  Spiritual Encounters  Spiritual Needs Emotional  Stress Factors  Patient Stress Factors Health changes   Visited with Rachel Herman and husband Rachel Herman to introduce [place] and chaplain availability.  Per pt, she is relieved and in better spirits after learning that her presenting issues did not bring a cancer diagnosis.  She reports good support from friends and family, including numerous calls and visits.  Provided pastoral presence and empathic listening.  [place], [place]

## 2013-09-23 NOTE — Progress Notes (Cosign Needed)
Was informed by the nursing staff that patient fell after taking a shower.   Patient states that she started feeling dizzy when entering the bathroom and doesn't remember much after. She denies chest pain, shortness of breath, leg edema, headache, dizziness at the moment, and cough.   General: Well developed, well nourished Caucasian female sitting in the patient bed  PV/DVT Evaluation: Pulses felt bilaterally in extremities. Edema not present.  MSK: Slight tenderness upon palpation of the left and right patella region. Rest of exam unremarkable  Neurological: Patient is alert. Pupils round and reactive to light. Fields of vision intact.

## 2013-09-23 NOTE — Progress Notes (Signed)
09/23/13 1100  Clinical Encounter Type  Visited With Health care provider Edmonia Caprio, RN)  Visit Type Initial  Referral From Nurse   Consulted with RN per referral for emotional support, but pt sleeping at attempted visit.  Plan to f/u this afternoon, but please also page as needs arise or timing constructive:  [phone].  Thank you.  [place], [place]

## 2013-09-24 ENCOUNTER — Encounter: Payer: Self-pay | Admitting: General Practice

## 2013-09-24 LAB — GLUCOSE, CAPILLARY: Glucose-Capillary: 106 mg/dL — ABNORMAL HIGH (ref 70–99)

## 2013-09-24 NOTE — Discharge Instructions (Signed)
Abdominal Hysterectomy Abdominal hysterectomy is a surgical procedure to remove your womb (uterus). Your uterus is the muscular organ that contains a developing baby. This surgery is done for many reasons. You may need an abdominal hysterectomy if you have cancer, growths (tumors), long-term pain, or bleeding. You may also have this procedure if your uterus has slipped down into your vagina (uterine prolapse). Depending on why you need an abdominal hysterectomy, you may also have other reproductive organs removed. These could include the part of your vagina that connects with your uterus (cervix), the organs that make eggs (ovaries), and the tubes that connect the ovaries to the uterus (fallopian tubes). LET YOUR HEALTH CARE PROVIDER KNOW ABOUT:   Any allergies you have.  All medicines you are taking, including vitamins, herbs, eye drops, creams, and over-the-counter medicines.  Previous problems you or members of your family have had with the use of anesthetics.  Any blood disorders you have.  Previous surgeries you have had.  Medical conditions you have. RISKS AND COMPLICATIONS Generally, this is a safe procedure. However, as with any procedure, problems can occur. Infection is the most common problem after an abdominal hysterectomy. Other possible problems include:  Bleeding.  Formation of blood clots that may break free and travel to your lungs.  Injury to other organs near your uterus.  Nerve injury causing nerve pain.  Decreased interest in sex or pain during sexual intercourse. BEFORE THE PROCEDURE  Abdominal hysterectomy is a major surgical procedure. It can affect the way you feel about yourself. Talk to your health care provider about the physical and emotional changes hysterectomy may cause.  You may need to have blood work and X-rays done before surgery.  Quit smoking if you smoke. Ask your health care provider for help if you are struggling to quit.  Stop taking  medicines that thin your blood as directed by your health care provider.  You may be instructed to take antibiotic medicines or laxatives before surgery.  Do not eat or drink anything for 6-8 hours before surgery.  Take your regular medicines with a small sip of water.  Bathe or shower the night or morning before surgery. PROCEDURE  Abdominal hysterectomy is done in the operating room at the hospital.  In most cases, you will be given a medicine that makes you go to sleep (general anesthetic).  The surgeon will make a cut (incision) through the skin in your lower belly.  The incision may be about 5-7 inches long. It may go side-to-side or up-and-down.  The surgeon will move aside the body tissue that covers your uterus. The surgeon will then carefully take out your uterus along with any of your other reproductive organs that need to be removed.  Bleeding will be controlled with clamps or sutures.  The surgeon will close your incision with sutures or metal clips. AFTER THE PROCEDURE  You will have some pain immediately after the procedure.  You will be given pain medicine in the recovery room.  You will be taken to your hospital room when you have recovered from the anesthesia.  You may need to stay in the hospital for 2-5 days.  You will be given instructions for recovery at home. Document Released: 01/26/2013 Document Reviewed: 01/26/2013 ExitCare Patient Information 2015 [employer]. This information is not intended to replace advice given to you by your health care provider. Make sure you discuss any questions you have with your health care provider.  

## 2013-09-24 NOTE — Discharge Summary (Signed)
Gynecology Physician Discharge Summary  Patient ID: Rachel Herman MRN: [id number] DOB/AGE: 08/12/1950 63 y.o.  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 Benign adnexa bilaterally. Benign omental cyst and biopsy. Report reviewed with patient.  Hospital Course:  Rachel Herman is a 63 y.o. [id number]  admitted for scheduled surgery.  She underwent the procedures as mentioned above, her operation was uncomplicated. For further details about surgery, please refer to the operative report. Patient had a prolonged postoperative course secondary to postoperative ileus and her other comorbidities.  She was seen by the DM educator and received DM education and management. Her BP was stable on HCTZ. Her diet was slowly advanced and she was able to to tolerate some carbohydrate modified solid diet.  By time of discharge on POD#4, her pain was controlled on oral pain medications; she was ambulating, voiding without difficulty, tolerating regular diet and passing flatus. JP output over the last day was 18 ml, so this was removed.  Her pathology was benign upon final review, this was shared with the patient. She was deemed stable for discharge to home.   Discharge Exam: Temp:  [98.4 F (36.9 C)-99.3 F (37.4 C)] 99.3 F (37.4 C) (08/21 0555) Pulse Rate:  [80-102] 101 (08/21 0555) Resp:  [16-20] 18  (08/21 0555) BP: (117-142)/(69-76) 117/73 mmHg (08/21 0555) SpO2:  [92 %-100 %] 95 % (08/21 0555)  08/20 0701 - 08/21 0700 In: 600 [P.O.:600] Out: 618 [Urine:600; Drains:18]  Fasting BS 106  General: alert and no distress  Resp: clear to auscultation anteriorly Cardio: regular rate and rhythm  GI: symmetric, bowel sounds present, distended, obese, moderate and diffuse tenderness in the entire abdomen.  Incision: clean, dry, intact and bloody drainage present around JP site.  JP removed. Extremities: extremities normal, atraumatic, no cyanosis or edema and Homans sign is negative, no sign of DVT  Vaginal Bleeding: minimal  Discharged Condition: Stable  Disposition: 01-Home or Self Care    Medication List    STOP taking these medications       ondansetron 4 MG disintegrating tablet  Commonly known as:  ZOFRAN-ODT      TAKE these medications       ACCU-CHEK FASTCLIX LANCETS Misc  1 Units by Percutaneous route 4 (four) times daily.     ACCU-CHEK NANO SMARTVIEW W/DEVICE Kit  1 kit by Subdermal route as directed. Check blood sugars for fasting, and two hours after breakfast, lunch and dinner (4 checks daily)     aspirin 325 MG tablet  Take 325 mg by mouth once.     DSS 100 MG Caps  Take 100 mg by mouth 2 (two) times daily.     glucose blood test strip  Commonly known as:  ACCU-CHEK SMARTVIEW  Use as instructed to check blood sugars     hydrochlorothiazide 25 MG tablet  Commonly known as:  HYDRODIURIL  Take 1 tablet (25 mg total) by mouth daily.     hydrochlorothiazide 25 MG tablet  Commonly known as:  HYDRODIURIL  Take 1 tablet (25 mg total) by mouth daily.     HYDROmorphone 2 MG tablet  Commonly known as:  DILAUDID  Take 1 tablet (2 mg total) by mouth every 4 (four) hours as needed for severe pain.     HYDROmorphone 2 MG tablet  Commonly known as:  DILAUDID  Take 1-2 tablets (2-4 mg total) by mouth every 3 (three) hours as needed for moderate pain or severe  pain.     ibuprofen 600 MG tablet  Commonly known as:  ADVIL,MOTRIN  Take 1 tablet (600 mg total) by mouth every 6 (six) hours as needed (mild pain).     metFORMIN 500 MG tablet  Commonly known as:  GLUCOPHAGE  Take 1 tablet (500 mg total) by mouth 2 (two) times daily with a meal.     multivitamin with minerals tablet  Take 1 tablet by mouth daily.     promethazine 25 MG tablet  Commonly known as:  PHENERGAN  Take 1 tablet (25 mg total) by mouth every 6 (six) hours as needed for nausea or vomiting.       Follow-up Information   Follow up with [facility] A, MD On 09/30/2013. (2:15pm for postoperative wound re-check.  Please call clinic/come to [facility] for any concerning issues)    Specialty:  Obstetrics and Gynecology   Contact information:   [place] [address] [phone]       Signed:  Verita Schneiders, MD, [place] Attending [place], [facility]

## 2013-09-24 NOTE — Progress Notes (Signed)
Discharge teaching complete. Pt understood all instructions and did not have any questions. Pt pushed in wheelchair and discharged home to family.

## 2013-09-24 NOTE — Progress Notes (Signed)
Patient observed ambulating in the hallways at 2040.  Came to talk to this nurse asking for shampoo and soap.  Patient pulled the emergency call bell in bathroom room 318  @ 2100.    Patient found sitting in a towel, body and hair still wet.  Patient alert, claims pain on her back.  Denies H/A, grip on both hands present, no breaks on skin, ROM of upper and lower extremities done while still on the floor, denies pain on movement.  Assisted back in bed with steady and with assist from NT and this nurse.  Vital signs recorded.   Patient informed that her husband will be informed but suggested not to call spouse, " unless he calls you should not wake him up".

## 2013-09-27 ENCOUNTER — Encounter: Payer: Self-pay | Admitting: Family Medicine

## 2013-09-27 ENCOUNTER — Ambulatory Visit: Payer: No Typology Code available for payment source | Attending: Family Medicine | Admitting: Family Medicine

## 2013-09-27 VITALS — BP 135/85 | HR 87 | Temp 98.1°F | Resp 16 | Ht 67.0 in | Wt 259.0 lb

## 2013-09-27 DIAGNOSIS — E119 Type 2 diabetes mellitus without complications: Secondary | ICD-10-CM

## 2013-09-27 DIAGNOSIS — E1169 Type 2 diabetes mellitus with other specified complication: Secondary | ICD-10-CM

## 2013-09-27 DIAGNOSIS — E669 Obesity, unspecified: Secondary | ICD-10-CM | POA: Insufficient documentation

## 2013-09-27 DIAGNOSIS — K5900 Constipation, unspecified: Secondary | ICD-10-CM | POA: Insufficient documentation

## 2013-09-27 DIAGNOSIS — I10 Essential (primary) hypertension: Secondary | ICD-10-CM | POA: Insufficient documentation

## 2013-09-27 DIAGNOSIS — Z90722 Acquired absence of ovaries, bilateral: Secondary | ICD-10-CM

## 2013-09-27 DIAGNOSIS — I152 Hypertension secondary to endocrine disorders: Secondary | ICD-10-CM

## 2013-09-27 DIAGNOSIS — E349 Endocrine disorder, unspecified: Secondary | ICD-10-CM

## 2013-09-27 DIAGNOSIS — Z90710 Acquired absence of both cervix and uterus: Secondary | ICD-10-CM

## 2013-09-27 DIAGNOSIS — Z9079 Acquired absence of other genital organ(s): Secondary | ICD-10-CM

## 2013-09-27 DIAGNOSIS — Z113 Encounter for screening for infections with a predominantly sexual mode of transmission: Secondary | ICD-10-CM

## 2013-09-27 DIAGNOSIS — Z6841 Body Mass Index (BMI) 40.0 and over, adult: Secondary | ICD-10-CM

## 2013-09-27 DIAGNOSIS — I158 Other secondary hypertension: Secondary | ICD-10-CM

## 2013-09-27 DIAGNOSIS — E785 Hyperlipidemia, unspecified: Secondary | ICD-10-CM

## 2013-09-27 LAB — LIPID PANEL
Cholesterol: 200 mg/dL (ref 0–200)
HDL: 34 mg/dL — ABNORMAL LOW (ref >39)
LDL Cholesterol: 118 mg/dL — ABNORMAL HIGH (ref 0–99)
Total CHOL/HDL Ratio: 5.9 Ratio
Triglycerides: 240 mg/dL — ABNORMAL HIGH (ref <150)
VLDL: 48 mg/dL — ABNORMAL HIGH (ref 0–40)

## 2013-09-27 LAB — GLUCOSE, POCT (MANUAL RESULT ENTRY): POC Glucose: 133 mg/dl — AB (ref 70–99)

## 2013-09-27 LAB — TSH: TSH: 1.986 u[IU]/mL (ref 0.350–4.500)

## 2013-09-27 MED ORDER — LISINOPRIL-HYDROCHLOROTHIAZIDE 20-25 MG PO TABS: 1.0000 | ORAL_TABLET | Freq: Every day | ORAL | Status: DC

## 2013-09-27 MED ORDER — MAGNESIUM HYDROXIDE 400 MG/5ML PO SUSP: 15.0000 mL | Freq: Three times a day (TID) | ORAL | Status: DC

## 2013-09-27 NOTE — Patient Instructions (Addendum)
Rachel Herman,  Thank you for coming in today. It was a pleasure meeting me.  I look forward to being your primary doctor.   1. Regarding diabetes: Checking your cholesterol, I may ask you to start a statin. Once healed from surgery, work on weight loss with regular exercise and low carb diet.    1. Make sure to eat breakfast, lunch and dinner (also may add one snack mid morning or mid afternoon).  2. Exercise such that you sweat some and your heart rate goes up most days of the week.  3. Water, water, water  4. Check blood sugar 2-3 x per week to get a feel for how different foods effect your blood sugar:  Goal fasting 70-130  Goal after eating < 200 5.  Beware of hypoglycemia which is blood sugar < 70 with or without symptoms  Diet Recommendations for Diabetes   Starchy (carb) foods include: Bread, rice, pasta, potatoes, corn, crackers, bagels, muffins, all baked goods.   Protein foods include: Meat, fish, poultry, eggs, dairy foods, and beans such as pinto and kidney beans (beans also provide carbohydrate).   1. Eat at least 3 meals and 1-2 snacks per day. Never go more than 4-5 hours while awake without eating.  2. Limit starchy foods to TWO per meal and ONE per snack. ONE portion of a starchy  food is equal to the following:   - ONE slice of bread (or its equivalent, such as half of a hamburger bun).   - 1/2 cup of a "scoopable" starchy food such as potatoes or rice.   - 1 OUNCE (28 grams) of starchy snack foods such as crackers or pretzels (look on label).   - 15 grams of carbohydrate as shown on food label.  3. Both lunch and dinner should include a protein food, a carb food, and vegetables.   - Obtain twice as many veg's as protein or carbohydrate foods for both lunch and dinner.   - Try to keep frozen veg's on hand for a quick vegetable serving.     - Fresh or frozen veg's are best.  4. Breakfast should always include protein.     Follow up in 2-3 weeks for lab visit  to recheck kidney function test.  Schedule an appointment at the [facility] to do a retinal scan.   Plan to see me in 3 months, sooner if needed.  Dr. Adrian Blackwater   Screening mammogram option for uninsured patients.  Please call Rolena Infante, [phone],  with the BCCCP (breast and cervical cancer control program) at the [facility] to set up an appointment to verify eligibility for a breast exam, mammogram, ultrasound. If you qualify this will be set up at [facility].

## 2013-09-27 NOTE — Assessment & Plan Note (Signed)
A: obese. P: weight loss as a part of DM2 management

## 2013-09-27 NOTE — Assessment & Plan Note (Signed)
A: post op day 7. Pain improving. Post op constipation. P: Continue colace. Add MOM. No signs of bleeding. Patient to keep post op f/u with gyn.

## 2013-09-27 NOTE — Assessment & Plan Note (Signed)
A: newly diagnosed. No signs of complications  P: Retial scan at [facility] Feet checked today, no signs of neuropathy.  Continue metformin. Low carb diet, weight loss, ACEi, plan for statin, check lipids, check TSH

## 2013-09-27 NOTE — Assessment & Plan Note (Signed)
One time HIV for all adults

## 2013-09-27 NOTE — Progress Notes (Signed)
Pt is here to establish care. Pt following up after having a hysterectomy Pt has a history of diabetes and HTN.

## 2013-09-27 NOTE — Progress Notes (Signed)
   Subjective:    Patient ID: Rachel Herman, female    DOB: Mar 07, 1950, 63 y.o.   MRN: [id number] CC: establish care, s/p  TAH/BSO, diabetes type 91 HPI 63 year old female presents with her husband discuss the following:  #1 type 2 diabetes: Patient is a type 2 diabetes on hospital for urgent surgery. Prior to diagnosis she reports diaphoresis. She denies polyuria polydipsia, dilatation, chest pain, shortness of breath. Since diagnosis patient on metformin 500 mg once daily. She checked her blood sugars. Her her husband is diabetic he reports fair control. 2 history of chronic hypertension x1 year. She has no history of hyperlipidemia or hypothyroidism. Patient is obese.  #2 postop TAH/BSO: Patient is one week postop. She admits to fatigue and low back and lower, pain. She reports pain is improving she takes a lot as needed. She is expressing constipation. Last bowel movement was 2 days ago prior to that she had not had a bowel movement for one week. She's taking Colace. She denies fever, chills, vaginal bleeding, bleeding at the incision site.  #3 hypertension: Patient is hypertensive for the past year. She takes hydrochlorothiazide 25 mg daily. Review of systems as per above.  Social history: Chronic nonsmoker  Review of Systems  as per history of present illness    Objective:   Physical Exam BP 135/85  Pulse 87  Temp(Src) 98.1 F (36.7 C) (Oral)  Resp 16  Ht 5\' 7"  (1.702 m)  Wt 259 lb (117.482 kg)  BMI 40.56 kg/m2  SpO2 97% General appearance: alert, cooperative, no distress and morbidly obese Eyes: conjunctivae/corneas clear. PERRL, EOM's intact. Fundi benign. Ears: normal TM's and external ear canals both ears Nose: Nares normal. Septum midline. Mucosa normal. No drainage or sinus tenderness. Throat: lips, mucosa, and tongue normal; teeth and gums normal Neck: no adenopathy, supple, symmetrical, trachea midline and thyroid not enlarged, symmetric, no  tenderness/mass/nodules Lungs: clear to auscultation bilaterally Heart: regular rate and rhythm, S1, S2 normal, no murmur, click, rub or gallop Abdomen: obese, pale ecchymoses, tranverse lower abdominal incision intact, slightly distended, normoactive bowel sounds, mildly tender to palpation bilateral quadrant without rebound or guarding. Extremities: edema  trace     Assessment & Plan:

## 2013-09-27 NOTE — Assessment & Plan Note (Signed)
A: well controlled Meds: compliant P:  Changed from HCTZ to prinzide due to DM F/u in two weeks to check Cr.

## 2013-09-28 DIAGNOSIS — E1169 Type 2 diabetes mellitus with other specified complication: Secondary | ICD-10-CM | POA: Insufficient documentation

## 2013-09-28 DIAGNOSIS — E785 Hyperlipidemia, unspecified: Secondary | ICD-10-CM

## 2013-09-28 LAB — HIV ANTIBODY (ROUTINE TESTING W REFLEX): HIV 1&2 Ab, 4th Generation: NONREACTIVE

## 2013-09-28 MED ORDER — ATORVASTATIN CALCIUM 40 MG PO TABS: 40.0000 mg | ORAL_TABLET | Freq: Every day | ORAL | Status: DC

## 2013-09-28 NOTE — Addendum Note (Signed)
Addended by: Boykin Nearing on: 09/28/2013 02:29 PM   Modules accepted: Orders

## 2013-09-29 ENCOUNTER — Telehealth: Payer: Self-pay | Admitting: *Deleted

## 2013-09-29 NOTE — Telephone Encounter (Signed)
Message copied by Joan Mayans on Wed Sep 29, 2013  9:24 AM ------      Message from: Boykin Nearing      Created: Tue Sep 28, 2013  2:29 PM       Please inform patient.       Screening HIV negative. Normal TSH.       High cholesterol.      Patient advised to start lipitor 40 mg once daily to lower cholesterol and risk of heart disease, rx sent to [facility] pharmacy. ------

## 2013-09-29 NOTE — Telephone Encounter (Signed)
Pt is aware of her lab results and will pick up the Lipitor at our pharmacy.

## 2013-09-30 ENCOUNTER — Ambulatory Visit (INDEPENDENT_AMBULATORY_CARE_PROVIDER_SITE_OTHER): Payer: No Typology Code available for payment source | Admitting: Obstetrics & Gynecology

## 2013-09-30 ENCOUNTER — Encounter: Payer: Self-pay | Admitting: Obstetrics & Gynecology

## 2013-09-30 VITALS — BP 135/86 | HR 93 | Temp 97.7°F | Wt 254.1 lb

## 2013-09-30 DIAGNOSIS — G8918 Other acute postprocedural pain: Secondary | ICD-10-CM

## 2013-09-30 DIAGNOSIS — M549 Dorsalgia, unspecified: Secondary | ICD-10-CM

## 2013-09-30 DIAGNOSIS — Z90722 Acquired absence of ovaries, bilateral: Secondary | ICD-10-CM

## 2013-09-30 DIAGNOSIS — Z9079 Acquired absence of other genital organ(s): Secondary | ICD-10-CM

## 2013-09-30 DIAGNOSIS — Z90710 Acquired absence of both cervix and uterus: Secondary | ICD-10-CM

## 2013-09-30 DIAGNOSIS — Z4889 Encounter for other specified surgical aftercare: Secondary | ICD-10-CM

## 2013-09-30 MED ORDER — IBUPROFEN 800 MG PO TABS: 800.0000 mg | ORAL_TABLET | Freq: Three times a day (TID) | ORAL | Status: DC | PRN

## 2013-09-30 MED ORDER — HYDROMORPHONE HCL 2 MG PO TABS: 2.0000 mg | ORAL_TABLET | ORAL | Status: DC | PRN

## 2013-09-30 MED ORDER — DSS 100 MG PO CAPS: 100.0000 mg | ORAL_CAPSULE | Freq: Two times a day (BID) | ORAL | Status: DC

## 2013-09-30 MED ORDER — CYCLOBENZAPRINE HCL 10 MG PO TABS: 10.0000 mg | ORAL_TABLET | Freq: Three times a day (TID) | ORAL | Status: DC | PRN

## 2013-09-30 NOTE — Patient Instructions (Signed)
Return to clinic for any scheduled appointments or for any gynecologic concerns as needed.   

## 2013-09-30 NOTE — Progress Notes (Signed)
   CLINIC ENCOUNTER NOTE  History:  63 y.o. [id number] here today for postoperative wound check.  She is s/p TAH/BSO on  09/20/13 for complex atypical endometrial hyperplasia, postmenopausal bleeding, and torsed 13 cm  left ovarian cyst.  Her postoperative course was complicated by ileus and slow postoperative recovery; she was discharged on POD#4.  Today, she reports still having significant incision pain and low back pain, wants refill of the Dilaudid. Has not been taking Ibuprofen as prescribed. No other acute concerns.  The following portions of the patient's history were reviewed and updated as appropriate: allergies, current medications, past family history, past medical history, past social history, past surgical history and problem list.  Review of Systems:  Pertinent items are noted in HPI.  Objective:  Physical Exam BP 135/86  Pulse 93  Temp(Src) 97.7 F (36.5 C)  Wt 254 lb 1.6 oz (115.259 kg) Gen: NAD Abd: Soft, obese, nontender and nondistended Incision: C/D/I, no erythema, no induration. Steristrips removed. 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 Benign adnexa bilaterally. Benign omental cyst and biopsy. Report reviewed with patient  Assessment & Plan:   Incision is without any signs of infection or complication Dilaudid refill given, told this will be the last narcotic Rx to be given Ibuprofen prescribed and patient encouraged to take this.  Flexeril prescribed for back pain Return in 4-5 weeks for pelvic exam; pelvic rest emphasized for 8 weeks after surgery. Patient will follow up with [facility] for primary care.   Verita Schneiders, MD, [place] Attending [place] for [employer], [place]

## 2013-10-06 ENCOUNTER — Ambulatory Visit: Payer: Self-pay

## 2013-10-14 ENCOUNTER — Ambulatory Visit: Payer: Self-pay | Admitting: Obstetrics & Gynecology

## 2013-10-27 ENCOUNTER — Encounter: Payer: Self-pay | Admitting: Obstetrics & Gynecology

## 2013-10-27 ENCOUNTER — Ambulatory Visit (INDEPENDENT_AMBULATORY_CARE_PROVIDER_SITE_OTHER): Payer: No Typology Code available for payment source | Admitting: Obstetrics & Gynecology

## 2013-10-27 VITALS — BP 111/71 | HR 100 | Temp 97.8°F | Ht 64.0 in | Wt 246.0 lb

## 2013-10-27 DIAGNOSIS — Z1231 Encounter for screening mammogram for malignant neoplasm of breast: Secondary | ICD-10-CM

## 2013-10-27 DIAGNOSIS — Z90710 Acquired absence of both cervix and uterus: Secondary | ICD-10-CM

## 2013-10-27 DIAGNOSIS — Z90722 Acquired absence of ovaries, bilateral: Secondary | ICD-10-CM

## 2013-10-27 DIAGNOSIS — Z09 Encounter for follow-up examination after completed treatment for conditions other than malignant neoplasm: Secondary | ICD-10-CM

## 2013-10-27 DIAGNOSIS — Z9079 Acquired absence of other genital organ(s): Secondary | ICD-10-CM

## 2013-10-27 NOTE — Patient Instructions (Signed)
Return to clinic for any scheduled appointments or for any gynecologic concerns as needed.   

## 2013-10-27 NOTE — Progress Notes (Signed)
   CLINIC ENCOUNTER NOTE  History:  63 y.o. [id number] here today for postoperative examiination She is s/p TAH/BSO on 09/20/13 for complex atypical endometrial hyperplasia, postmenopausal bleeding, and torsed 13 cm left ovarian cyst. Her postoperative course was complicated by ileus and slow postoperative recovery; she was discharged on POD#4. She was seen on 09/30/13 for wound check, which was within normal limits.  Today, she reports mild incision pain. No other acute concerns.   The following portions of the patient's history were reviewed and updated as appropriate: allergies, current medications, past family history, past medical history, past social history, past surgical history and problem list.  Review of Systems:  Pertinent items are noted in HPI.  Objective:  Physical Exam BP 111/71  Pulse 100  Temp(Src) 97.8 F (36.6 C) (Oral)  Ht 5\' 4"  (1.626 m)  Wt 246 lb (111.585 kg)  BMI 42.21 kg/m2 Gen: NAD Abd: Soft, obese, nontender and nondistended  Incision: C/D/I, no erythema, no induration. Pelvic: Normal appearing external genitalia; normal appearing vaginal mucosa. Well-healed vaginal cuff.     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 Benign adnexa bilaterally. Benign omental cyst and biopsy. Report reviewed with patient   Assessment & Plan:  Normal postoperative exam Scheduled for screening mammogram Patient will follow up with [facility] for primary care.   Verita Schneiders, MD, [place] Attending [place] for [employer], [place]

## 2013-11-11 ENCOUNTER — Ambulatory Visit (HOSPITAL_COMMUNITY)
Admission: RE | Admit: 2013-11-11 | Discharge: 2013-11-11 | Disposition: A | Discharge status: Home / Self Care | Payer: No Typology Code available for payment source | Source: Ambulatory Visit | Attending: Obstetrics & Gynecology | Admitting: Obstetrics & Gynecology

## 2013-11-11 DIAGNOSIS — Z9079 Acquired absence of other genital organ(s): Secondary | ICD-10-CM

## 2013-11-11 DIAGNOSIS — Z90710 Acquired absence of both cervix and uterus: Secondary | ICD-10-CM

## 2013-11-11 DIAGNOSIS — Z1231 Encounter for screening mammogram for malignant neoplasm of breast: Secondary | ICD-10-CM | POA: Insufficient documentation

## 2013-11-11 DIAGNOSIS — Z90722 Acquired absence of ovaries, bilateral: Secondary | ICD-10-CM

## 2013-11-12 ENCOUNTER — Other Ambulatory Visit: Payer: Self-pay | Admitting: Obstetrics & Gynecology

## 2013-11-12 DIAGNOSIS — R928 Other abnormal and inconclusive findings on diagnostic imaging of breast: Secondary | ICD-10-CM

## 2013-11-23 ENCOUNTER — Ambulatory Visit
Admission: RE | Admit: 2013-11-23 | Discharge: 2013-11-23 | Disposition: A | Discharge status: Home / Self Care | Payer: No Typology Code available for payment source | Source: Ambulatory Visit | Attending: Obstetrics & Gynecology | Admitting: Obstetrics & Gynecology

## 2013-11-23 ENCOUNTER — Other Ambulatory Visit: Payer: Self-pay | Admitting: Obstetrics & Gynecology

## 2013-11-23 DIAGNOSIS — R928 Other abnormal and inconclusive findings on diagnostic imaging of breast: Secondary | ICD-10-CM

## 2013-11-29 ENCOUNTER — Other Ambulatory Visit: Payer: Self-pay | Admitting: Obstetrics & Gynecology

## 2013-11-29 DIAGNOSIS — R928 Other abnormal and inconclusive findings on diagnostic imaging of breast: Secondary | ICD-10-CM

## 2013-12-01 ENCOUNTER — Inpatient Hospital Stay: Admission: RE | Admit: 2013-12-01 | Payer: No Typology Code available for payment source | Source: Ambulatory Visit

## 2013-12-06 ENCOUNTER — Encounter: Payer: Self-pay | Admitting: Obstetrics & Gynecology

## 2013-12-08 ENCOUNTER — Encounter (HOSPITAL_COMMUNITY): Payer: Self-pay

## 2013-12-08 ENCOUNTER — Ambulatory Visit (HOSPITAL_COMMUNITY)
Admission: RE | Admit: 2013-12-08 | Discharge: 2013-12-08 | Disposition: A | Discharge status: Home / Self Care | Payer: Medicaid Other | Source: Ambulatory Visit | Attending: Obstetrics and Gynecology | Admitting: Obstetrics and Gynecology

## 2013-12-08 VITALS — BP 114/80 | Ht 64.0 in | Wt 247.4 lb

## 2013-12-08 DIAGNOSIS — Z1239 Encounter for other screening for malignant neoplasm of breast: Secondary | ICD-10-CM

## 2013-12-08 HISTORY — DX: Hyperlipidemia, unspecified: E78.5

## 2013-12-08 NOTE — Patient Instructions (Signed)
Explained to Rachel Herman that she did not need a Pap smear today due to last Pap smear was 08/18/2013 and has a history of a hysterectomy for benign reasons. Let patient know that she does not need any further Pap smears due to her history of a hysterectomy for benign reasons. Referred patient to the [facility] for left breast biopsy per recommendation. Appointment scheduled for Thursday, December 09, 2013 at 1430. Patient aware of appointment and will be there. Rachel Herman verbalized understanding.  Abdi Husak, Arvil [place], RN 8:17 AM

## 2013-12-08 NOTE — Progress Notes (Signed)
Patient referred to [facility] by the [place] due to recommending a left breast biopsy. Screening mammogram completed 11/11/2013 at [facility] recommending additional imaging of the left breast. Left breast diagnostic mammogram completed 11/23/2013 at the [facility] that recommends a biopsy of the left breast. Complaints of left breast pain that comes and goes. Patient stated the pain started after her diagnostic mammogram on 11/23/2013. Patient rates pain at a 8 out of 10.  Pap Smear:  Pap smear not completed today. Last Pap smear was 08/18/2013 at the [facility] and normal with negative HPV. Per patient has no history of an abnormal Pap smear. Patient has a history of a hysterectomy 09/20/2013 due to an ovarian cyst and complex atypical endometrial hyperplasia. Patient no longer needs Pap smears due to her history of a hysterectomy for benign reasons per BCCCP and ACOG guidelines. Last Pap smear result is in EPIC.  Physical exam: Breasts Breasts symmetrical. No skin abnormalities bilateral breasts. No nipple retraction bilateral breasts. No nipple discharge bilateral breasts. No lymphadenopathy. No lumps palpated bilateral breasts. Unable to palpate any lumps in area of concern within the left breast. No complaints of pain or tenderness on exam. Referred patient to the [facility] for left breast biopsy per recommendation. Appointment scheduled for Thursday, December 09, 2013 at 1430.       Pelvic/Bimanual No Pap smear completed today since last Pap smear was 08/18/2013 and has a history of a hysterectomy for benign reasons. Pap smear not indicated per BCCCP guidelines.

## 2013-12-09 ENCOUNTER — Ambulatory Visit
Admission: RE | Admit: 2013-12-09 | Discharge: 2013-12-09 | Disposition: A | Discharge status: Home / Self Care | Payer: No Typology Code available for payment source | Source: Ambulatory Visit | Attending: Obstetrics & Gynecology | Admitting: Obstetrics & Gynecology

## 2013-12-09 ENCOUNTER — Other Ambulatory Visit: Payer: Self-pay | Admitting: Obstetrics & Gynecology

## 2013-12-09 DIAGNOSIS — C50919 Malignant neoplasm of unspecified site of unspecified female breast: Secondary | ICD-10-CM

## 2013-12-09 DIAGNOSIS — R928 Other abnormal and inconclusive findings on diagnostic imaging of breast: Secondary | ICD-10-CM

## 2013-12-09 HISTORY — DX: Malignant neoplasm of unspecified site of unspecified female breast: C50.919

## 2013-12-10 ENCOUNTER — Ambulatory Visit
Admission: RE | Admit: 2013-12-10 | Discharge: 2013-12-10 | Disposition: A | Discharge status: Home / Self Care | Payer: No Typology Code available for payment source | Source: Ambulatory Visit | Attending: Obstetrics & Gynecology | Admitting: Obstetrics & Gynecology

## 2013-12-13 ENCOUNTER — Other Ambulatory Visit (INDEPENDENT_AMBULATORY_CARE_PROVIDER_SITE_OTHER): Payer: Self-pay | Admitting: Surgery

## 2013-12-13 DIAGNOSIS — C50912 Malignant neoplasm of unspecified site of left female breast: Secondary | ICD-10-CM

## 2013-12-14 ENCOUNTER — Ambulatory Visit (INDEPENDENT_AMBULATORY_CARE_PROVIDER_SITE_OTHER): Payer: Self-pay | Admitting: Surgery

## 2013-12-14 DIAGNOSIS — C50912 Malignant neoplasm of unspecified site of left female breast: Secondary | ICD-10-CM

## 2013-12-14 NOTE — H&P (Signed)
Rachel Herman 12/13/2013 2:38 PM Location: [facility] Patient #: [id number] DOB: [birth year] Married / Language: English / Race: White Female History of Present Illness Rachel Herman A. Rachel Wengert MD; 12/13/2013 9:29 PM) Patient words: left breast cancer   Breast, left, needle core biopsy, mass, 10 o'clock - INVASIVE DUCTAL CARCINOMA, SEE COMMENT. Diagnosis Note While grading is best performed at the time of excision, the carcinoma appears high grade. Prognostic markers will be ordered and reported in an addendum. Dr. Lyndon Code has reviewed the case. The case was called to [facility] on 12/10/2013. Vicente Males MD Pathologist, Electronic Signature (Case signed 12/10/2013) Specimen    CLINICAL DATA: Callback from screening mammogram for a possible mass left breast  EXAM: DIGITAL DIAGNOSTIC LEFT MAMMOGRAM  ULTRASOUND LEFT BREAST  COMPARISON: November 11, 2013  ACR Breast Density Category b: There are scattered areas of fibroglandular density.  FINDINGS: Spot compression CC and MLO views of the left breast are submitted. There is a spiculated mass in the upper medial left breast.  Ultrasound is performed, showing angulated hypoechoic mass at the left breast 10 o'clock 18 cm from nipple measuring 1.82 x 0.71 x 0.88 cm. Ultrasound of the left axilla is negative.  IMPRESSION: Suspicious findings.  RECOMMENDATION: Ultrasound-guided core biopsy left breast mass.  I have discussed the findings and recommendations with the patient. Results were also provided in writing at the conclusion of the visit. If applicable, a reminder letter will be sent to the patient regarding the next appointment.  BI-RADS CATEGORY 4: Suspicious.   Electronically Signed By: [name] M.D. On: 11/23/2013 09:41.  The patient is a 63 year old female who presents with breast cancer. The patient is being seen for a consultation for Stage I invasive ductal carcinoma  of the left breast. Initial presentation was 3 week(s) ago for an abnormal mammogram. Current diagnosis was determined by mammography and breast ultrasound. This problem has not been previously evaluated. This problem has not been previously treated. Symptoms include fatigue. The patient has a surgical history of oophorectomy. Other Problems Rachel Herman, [place]; 12/13/2013 2:49 PM) Back Pain Gastroesophageal Reflux Disease High blood pressure  Past Surgical History Rachel Herman, [place]; 12/13/2013 2:49 PM) Breast Biopsy Left. Hysterectomy (not due to cancer) - Complete Resection of Stomach  Diagnostic Studies History Rachel Herman, [place]; 12/13/2013 2:49 PM) Colonoscopy never Mammogram within last year  Allergies Rachel Herman, [place]; 12/13/2013 2:50 PM) No Known Drug Allergies 12/13/2013  Medication History Rachel Herman, [place]; 12/13/2013 2:51 PM) Lisinopril-Hydrochlorothiazide (20-25MG  Tablet, Oral) Active. MetFORMIN HCl (500MG  Tablet, Oral) Active. Atorvastatin Calcium (40MG  Tablet, Oral) Active. Ibuprofen (800MG  Tablet, Oral) Active. Multi Vitamin Daily (Oral) Active. Aspirin EC (325MG  Tablet DR, Oral) Active.  Social History Rachel Herman, [place]; 12/13/2013 2:49 PM) Caffeine use Coffee. No drug use Tobacco use Never smoker.  Family History Rachel Herman, [place]; 12/13/2013 2:49 PM) Diabetes Mellitus Mother. Heart Disease Father. Hypertension Mother, Sister.  Pregnancy / Birth History Rachel Herman, [place]; 12/13/2013 2:49 PM) Age of menopause <44 Para 3     Review of Systems Rachel Herman R. Brooks CMA; 12/13/2013 2:49 PM) General Not Present- Appetite Loss, Chills, Fatigue, Fever, Night Sweats, Weight Gain and Weight Loss. HEENT Not Present- Earache, Hearing Loss, Hoarseness, Nose Bleed, Oral Ulcers, Ringing in the Ears, Seasonal Allergies, Sinus Pain, Sore Throat, Visual Disturbances, Wears glasses/contact lenses and Yellow  Eyes. Respiratory Not Present- Bloody sputum, Chronic Cough, Difficulty Breathing, Snoring and Wheezing. Gastrointestinal Present- Constipation  and Nausea. Not Present- Abdominal Pain, Bloating, Bloody Stool, Change in Bowel Habits, Chronic diarrhea, Difficulty Swallowing, Excessive gas, Gets full quickly at meals, Hemorrhoids, Indigestion, Rectal Pain and Vomiting. Female Genitourinary Not Present- Frequency, Nocturia, Painful Urination, Pelvic Pain and Urgency. Musculoskeletal Present- Back Pain. Not Present- Joint Pain, Joint Stiffness, Muscle Pain, Muscle Weakness and Swelling of Extremities. Psychiatric Not Present- Anxiety, Bipolar, Change in Sleep Pattern, Depression, Fearful and Frequent crying. Endocrine Present- Hair Changes and New Diabetes. Not Present- Cold Intolerance, Excessive Hunger, Heat Intolerance and Hot flashes. Hematology Not Present- Easy Bruising, Excessive bleeding, Gland problems, HIV and Persistent Infections.  Vitals [name] CMA; 12/13/2013 2:49 PM) 12/13/2013 2:48 PM Weight: 244.13 lb Height: 64in Body Surface Area: 2.24 m Body Mass Index: 41.9 kg/m Pulse: 88 (Regular)  Resp.: 14 (Unlabored)  BP: 136/86 (Sitting, Left Arm, Standard)     Physical Exam (Tayvin Preslar A. Yusef Lamp MD; 12/13/2013 9:33 PM)  General Mental Status-Alert. General Appearance-Consistent with stated age. Hydration-Well hydrated. Voice-Normal.  Head and Neck Head-normocephalic, atraumatic with no lesions or palpable masses.  Chest and Lung Exam Chest and lung exam reveals -quiet, even and easy respiratory effort with no use of accessory muscles and on auscultation, normal breath sounds, no adventitious sounds and normal vocal resonance. Inspection Chest Wall - Normal. Back - normal.  Breast Breast - Left-Symmetric, Mildly tender and Biopsy scar, No Dimpling, No Peau d' Orange. Note:blister biopsy site upper breast Breast - Right-Symmetric, Non  Tender, No Dimpling, No Peau d' Orange.  Cardiovascular Cardiovascular examination reveals -on palpation PMI is normal in location and amplitude, no palpable S3 or S4. Normal cardiac borders., normal heart sounds, regular rate and rhythm with no murmurs, carotid auscultation reveals no bruits and normal pedal pulses bilaterally.  Neurologic Neurologic evaluation reveals -alert and oriented x 3 with no impairment of recent or remote memory. Mental Status-Normal.  Musculoskeletal Normal Exam - Left-Upper Extremity Strength Normal and Lower Extremity Strength Normal. Normal Exam - Right-Upper Extremity Strength Normal, Lower Extremity Weakness.  Lymphatic Axillary  General Axillary Region: Bilateral - Description - Normal.    Assessment & Plan (Charda Janis A. Deundre Thong MD; 12/14/2013 8:49 AM)  BREAST CANCER, LEFT (174.9  C50.912) Impression: Risk of lumpectomy include bleeding, infection, seroma, more surgery, use of seed/wire, wound care, cosmetic deformity and the need for other treatments, death , blood clots, death. Pt agrees to proceed. Risk of sentinel lymph node mapping include bleeding, infection, lymphedema, shoulder pain. stiffness, dye allergy. cosmetic deformity , blood clots, death, need for more surgery. Pt agres to proceed.  PT OPTED FOR BREAST CONSERVATION OVER MASTECTOMY/ RECONSTRUCTION. LEFT BREAST SEED LOCALIZED LUMPECTOMY AND SLN MAPPING.  Current Plans Referred to Genetic Counseling, for evaluation and follow up (Medical Genetics). Referred to Oncology, for evaluation and follow up (Oncology). Referred to Radiation Oncology, for evaluation and follow up (Radiation Oncology). Referred to Physical Therapy, for evaluation and follow up (Physical Therapy). CCS Consent - BASIC (Gross): discussed with patient and provided information. Pt Education - CSS Breast Biopsy Instructions (FLB): discussed with patient and provided information. Pt Education - CCS Breast Biopsy  [facility]

## 2013-12-16 ENCOUNTER — Ambulatory Visit (HOSPITAL_COMMUNITY): Payer: No Typology Code available for payment source

## 2013-12-17 ENCOUNTER — Telehealth: Payer: Self-pay | Admitting: *Deleted

## 2013-12-17 NOTE — Telephone Encounter (Signed)
Called pt and confirmed 01/10/14 appt w/ her.  Mailed before appt letter, calendar, welcoming packet & intake form to pt.  Emailed Dr. Brantley Stage & Anderson Malta at [facility] to make them aware.  Added to spreadsheet. All paperwork is in EPIC.

## 2013-12-23 ENCOUNTER — Encounter (HOSPITAL_BASED_OUTPATIENT_CLINIC_OR_DEPARTMENT_OTHER): Payer: Self-pay | Admitting: *Deleted

## 2013-12-23 NOTE — Progress Notes (Signed)
Pt denies any sedation until hyst 8/15 No cardiac or resp problems- To come in for labs had ekg 8/15

## 2013-12-23 NOTE — Progress Notes (Signed)
   12/23/13 1548  OBSTRUCTIVE SLEEP APNEA  Have you ever been diagnosed with sleep apnea through a sleep study? No  Do you snore loudly (loud enough to be heard through closed doors)?  0  Do you often feel tired, fatigued, or sleepy during the daytime? 0  Has anyone observed you stop breathing during your sleep? 0  Do you have, or are you being treated for high blood pressure? 1  BMI more than 35 kg/m2? 1  Age over 64 years old? 1  Neck circumference greater than 40 cm/16 inches? 1  Gender: 0  Obstructive Sleep Apnea Score 4  Score 4 or greater  Results sent to PCP

## 2013-12-27 ENCOUNTER — Ambulatory Visit
Admission: RE | Admit: 2013-12-27 | Discharge: 2013-12-27 | Disposition: A | Discharge status: Home / Self Care | Payer: Self-pay | Source: Ambulatory Visit | Attending: Surgery | Admitting: Surgery

## 2013-12-27 ENCOUNTER — Encounter (HOSPITAL_BASED_OUTPATIENT_CLINIC_OR_DEPARTMENT_OTHER): Payer: Medicaid Other

## 2013-12-27 ENCOUNTER — Encounter (HOSPITAL_BASED_OUTPATIENT_CLINIC_OR_DEPARTMENT_OTHER)
Admission: RE | Admit: 2013-12-27 | Discharge: 2013-12-27 | Disposition: A | Discharge status: Home / Self Care | Payer: Medicaid Other | Source: Ambulatory Visit

## 2013-12-27 DIAGNOSIS — I10 Essential (primary) hypertension: Secondary | ICD-10-CM | POA: Insufficient documentation

## 2013-12-27 DIAGNOSIS — Z01818 Encounter for other preprocedural examination: Secondary | ICD-10-CM

## 2013-12-27 DIAGNOSIS — Z6841 Body Mass Index (BMI) 40.0 and over, adult: Secondary | ICD-10-CM | POA: Diagnosis not present

## 2013-12-27 DIAGNOSIS — K219 Gastro-esophageal reflux disease without esophagitis: Secondary | ICD-10-CM | POA: Diagnosis not present

## 2013-12-27 DIAGNOSIS — E119 Type 2 diabetes mellitus without complications: Secondary | ICD-10-CM | POA: Insufficient documentation

## 2013-12-27 DIAGNOSIS — C50112 Malignant neoplasm of central portion of left female breast: Secondary | ICD-10-CM | POA: Diagnosis not present

## 2013-12-27 LAB — CBC WITH DIFFERENTIAL/PLATELET
Basophils Absolute: 0 10*3/uL (ref 0.0–0.1)
Basophils Relative: 0 % (ref 0–1)
Eosinophils Absolute: 0.1 10*3/uL (ref 0.0–0.7)
Eosinophils Relative: 1 % (ref 0–5)
HCT: 41.9 % (ref 36.0–46.0)
Hemoglobin: 13.3 g/dL (ref 12.0–15.0)
Lymphocytes Relative: 37 % (ref 12–46)
Lymphs Abs: 3.5 10*3/uL (ref 0.7–4.0)
MCH: 27.7 pg (ref 26.0–34.0)
MCHC: 31.7 g/dL (ref 30.0–36.0)
MCV: 87.1 fL (ref 78.0–100.0)
Monocytes Absolute: 0.6 10*3/uL (ref 0.1–1.0)
Monocytes Relative: 6 % (ref 3–12)
Neutro Abs: 5.3 10*3/uL (ref 1.7–7.7)
Neutrophils Relative %: 56 % (ref 43–77)
Platelets: 319 10*3/uL (ref 150–400)
RBC: 4.81 MIL/uL (ref 3.87–5.11)
RDW: 14 % (ref 11.5–15.5)
WBC: 9.6 10*3/uL (ref 4.0–10.5)

## 2013-12-27 LAB — COMPREHENSIVE METABOLIC PANEL
ALT: 58 U/L — ABNORMAL HIGH (ref 0–35)
AST: 65 U/L — ABNORMAL HIGH (ref 0–37)
Albumin: 3.9 g/dL (ref 3.5–5.2)
Alkaline Phosphatase: 67 U/L (ref 39–117)
Anion gap: 14 (ref 5–15)
BUN: 14 mg/dL (ref 6–23)
CO2: 27 mEq/L (ref 19–32)
Calcium: 9.9 mg/dL (ref 8.4–10.5)
Chloride: 100 mEq/L (ref 96–112)
Creatinine, Ser: 0.8 mg/dL (ref 0.50–1.10)
GFR calc Af Amer: 89 mL/min — ABNORMAL LOW (ref >90)
GFR calc non Af Amer: 77 mL/min — ABNORMAL LOW (ref >90)
Glucose, Bld: 112 mg/dL — ABNORMAL HIGH (ref 70–99)
Potassium: 3.7 mEq/L (ref 3.7–5.3)
Sodium: 141 mEq/L (ref 137–147)
Total Bilirubin: 0.4 mg/dL (ref 0.3–1.2)
Total Protein: 8.3 g/dL (ref 6.0–8.3)

## 2013-12-29 ENCOUNTER — Ambulatory Visit
Admission: RE | Admit: 2013-12-29 | Discharge: 2013-12-29 | Disposition: A | Discharge status: Home / Self Care | Payer: Self-pay | Source: Ambulatory Visit | Attending: Surgery | Admitting: Surgery

## 2013-12-29 ENCOUNTER — Encounter (HOSPITAL_BASED_OUTPATIENT_CLINIC_OR_DEPARTMENT_OTHER)
Admission: RE | Disposition: A | Discharge status: Home / Self Care | Payer: Self-pay | Source: Ambulatory Visit | Attending: Surgery

## 2013-12-29 ENCOUNTER — Ambulatory Visit (HOSPITAL_BASED_OUTPATIENT_CLINIC_OR_DEPARTMENT_OTHER): Payer: Medicaid Other | Admitting: Certified Registered"

## 2013-12-29 ENCOUNTER — Ambulatory Visit (HOSPITAL_BASED_OUTPATIENT_CLINIC_OR_DEPARTMENT_OTHER)
Admission: RE | Admit: 2013-12-29 | Discharge: 2013-12-29 | Disposition: A | Discharge status: Home / Self Care | Payer: Medicaid Other | Source: Ambulatory Visit | Attending: Surgery | Admitting: Surgery

## 2013-12-29 ENCOUNTER — Encounter: Payer: Self-pay | Admitting: Radiation Oncology

## 2013-12-29 ENCOUNTER — Ambulatory Visit (HOSPITAL_COMMUNITY)
Admission: RE | Admit: 2013-12-29 | Discharge: 2013-12-29 | Disposition: A | Discharge status: Home / Self Care | Payer: Medicaid Other | Source: Ambulatory Visit | Attending: Surgery | Admitting: Surgery

## 2013-12-29 ENCOUNTER — Encounter (HOSPITAL_BASED_OUTPATIENT_CLINIC_OR_DEPARTMENT_OTHER): Payer: Self-pay | Admitting: *Deleted

## 2013-12-29 DIAGNOSIS — C50912 Malignant neoplasm of unspecified site of left female breast: Secondary | ICD-10-CM

## 2013-12-29 DIAGNOSIS — C50112 Malignant neoplasm of central portion of left female breast: Secondary | ICD-10-CM | POA: Diagnosis not present

## 2013-12-29 DIAGNOSIS — K219 Gastro-esophageal reflux disease without esophagitis: Secondary | ICD-10-CM | POA: Diagnosis not present

## 2013-12-29 DIAGNOSIS — E119 Type 2 diabetes mellitus without complications: Secondary | ICD-10-CM | POA: Diagnosis not present

## 2013-12-29 DIAGNOSIS — I10 Essential (primary) hypertension: Secondary | ICD-10-CM | POA: Diagnosis not present

## 2013-12-29 HISTORY — DX: Nausea with vomiting, unspecified: Z98.890

## 2013-12-29 HISTORY — DX: Nausea with vomiting, unspecified: R11.2

## 2013-12-29 HISTORY — PX: BREAST LUMPECTOMY WITH AXILLARY LYMPH NODE BIOPSY: SHX5593

## 2013-12-29 HISTORY — PX: RADIOACTIVE SEED GUIDED PARTIAL MASTECTOMY WITH AXILLARY SENTINEL LYMPH NODE BIOPSY: SHX6520

## 2013-12-29 LAB — GLUCOSE, CAPILLARY
Glucose-Capillary: 137 mg/dL — ABNORMAL HIGH (ref 70–99)
Glucose-Capillary: 102 mg/dL — ABNORMAL HIGH (ref 70–99)

## 2013-12-29 LAB — POCT HEMOGLOBIN-HEMACUE: Hemoglobin: 10.8 g/dL — ABNORMAL LOW (ref 12.0–15.0)

## 2013-12-29 SURGERY — RADIOACTIVE SEED GUIDED PARTIAL MASTECTOMY WITH AXILLARY SENTINEL LYMPH NODE BIOPSY
Anesthesia: Regional | Site: Breast | Laterality: Left

## 2013-12-29 MED ORDER — CHLORHEXIDINE GLUCONATE 4 % EX LIQD: 1.0000 "application " | Freq: Once | CUTANEOUS | Status: DC

## 2013-12-29 MED ORDER — HYDROMORPHONE HCL 1 MG/ML IJ SOLN
INTRAMUSCULAR | Status: AC
  Filled 2013-12-29: qty 1

## 2013-12-29 MED ORDER — SCOPOLAMINE 1 MG/3DAYS TD PT72
MEDICATED_PATCH | TRANSDERMAL | Status: AC
Start: 2013-12-29 — End: 2013-12-29
  Filled 2013-12-29: qty 1

## 2013-12-29 MED ORDER — TECHNETIUM TC 99M SULFUR COLLOID FILTERED
1.0000 | Freq: Once | INTRAVENOUS | Status: AC | PRN
  Administered 2013-12-29: 1 via INTRADERMAL

## 2013-12-29 MED ORDER — OXYCODONE HCL 5 MG PO TABS
ORAL_TABLET | ORAL | Status: AC
  Filled 2013-12-29: qty 1

## 2013-12-29 MED ORDER — FENTANYL CITRATE 0.05 MG/ML IJ SOLN
INTRAMUSCULAR | Status: DC | PRN
  Administered 2013-12-29: 50 ug via INTRAVENOUS

## 2013-12-29 MED ORDER — OXYCODONE HCL 5 MG PO TABS
5.0000 mg | ORAL_TABLET | Freq: Once | ORAL | Status: AC | PRN
Start: 2013-12-29 — End: 2013-12-29
  Administered 2013-12-29: 5 mg via ORAL

## 2013-12-29 MED ORDER — FENTANYL CITRATE 0.05 MG/ML IJ SOLN
INTRAMUSCULAR | Status: AC
  Filled 2013-12-29: qty 6

## 2013-12-29 MED ORDER — OXYCODONE HCL 5 MG/5ML PO SOLN: 5.0000 mg | Freq: Once | ORAL | Status: AC | PRN

## 2013-12-29 MED ORDER — ONDANSETRON HCL 4 MG/2ML IJ SOLN
INTRAMUSCULAR | Status: DC | PRN
  Administered 2013-12-29: 4 mg via INTRAVENOUS

## 2013-12-29 MED ORDER — CEFAZOLIN SODIUM-DEXTROSE 2-3 GM-% IV SOLR
INTRAVENOUS | Status: AC
  Filled 2013-12-29: qty 50

## 2013-12-29 MED ORDER — MIDAZOLAM HCL 5 MG/5ML IJ SOLN
INTRAMUSCULAR | Status: DC | PRN
  Administered 2013-12-29: 1 mg via INTRAVENOUS

## 2013-12-29 MED ORDER — OXYCODONE-ACETAMINOPHEN 5-325 MG PO TABS: 1.0000 | ORAL_TABLET | ORAL | Status: DC | PRN

## 2013-12-29 MED ORDER — BUPIVACAINE-EPINEPHRINE (PF) 0.25% -1:200000 IJ SOLN
INTRAMUSCULAR | Status: DC | PRN
  Administered 2013-12-29: 3 mL

## 2013-12-29 MED ORDER — PROPOFOL 10 MG/ML IV BOLUS
INTRAVENOUS | Status: DC | PRN
  Administered 2013-12-29: 150 mg via INTRAVENOUS

## 2013-12-29 MED ORDER — MIDAZOLAM HCL 2 MG/2ML IJ SOLN
INTRAMUSCULAR | Status: AC
  Filled 2013-12-29: qty 2

## 2013-12-29 MED ORDER — HYDROMORPHONE HCL 1 MG/ML IJ SOLN
0.2500 mg | INTRAMUSCULAR | Status: DC | PRN
  Administered 2013-12-29: 0.25 mg via INTRAVENOUS
  Administered 2013-12-29: 0.5 mg via INTRAVENOUS
  Administered 2013-12-29: 0.25 mg via INTRAVENOUS

## 2013-12-29 MED ORDER — LACTATED RINGERS IV SOLN
INTRAVENOUS | Status: DC
  Administered 2013-12-29 (×2): via INTRAVENOUS

## 2013-12-29 MED ORDER — LIDOCAINE HCL (CARDIAC) 20 MG/ML IV SOLN
INTRAVENOUS | Status: DC | PRN
  Administered 2013-12-29: 30 mg via INTRAVENOUS

## 2013-12-29 MED ORDER — MIDAZOLAM HCL 2 MG/2ML IJ SOLN
1.0000 mg | INTRAMUSCULAR | Status: DC | PRN
  Administered 2013-12-29: 1 mg via INTRAVENOUS

## 2013-12-29 MED ORDER — DEXAMETHASONE SODIUM PHOSPHATE 4 MG/ML IJ SOLN
INTRAMUSCULAR | Status: DC | PRN
  Administered 2013-12-29: 4 mg via INTRAVENOUS

## 2013-12-29 MED ORDER — SCOPOLAMINE 1 MG/3DAYS TD PT72
1.0000 | MEDICATED_PATCH | Freq: Once | TRANSDERMAL | Status: DC
  Administered 2013-12-29: 1.5 mg via TRANSDERMAL

## 2013-12-29 MED ORDER — FENTANYL CITRATE 0.05 MG/ML IJ SOLN
50.0000 ug | INTRAMUSCULAR | Status: DC | PRN
  Administered 2013-12-29: 100 ug via INTRAVENOUS

## 2013-12-29 MED ORDER — SODIUM CHLORIDE 0.9 % IJ SOLN
INTRAMUSCULAR | Status: AC
  Filled 2013-12-29: qty 10

## 2013-12-29 MED ORDER — BUPIVACAINE-EPINEPHRINE (PF) 0.25% -1:200000 IJ SOLN
INTRAMUSCULAR | Status: AC
  Filled 2013-12-29: qty 30

## 2013-12-29 MED ORDER — ROPIVACAINE HCL 5 MG/ML IJ SOLN
INTRAMUSCULAR | Status: DC | PRN
  Administered 2013-12-29: 30 mL

## 2013-12-29 MED ORDER — METHYLENE BLUE 1 % INJ SOLN
INTRAMUSCULAR | Status: AC
Start: 2013-12-29 — End: 2013-12-29
  Filled 2013-12-29: qty 10

## 2013-12-29 MED ORDER — FENTANYL CITRATE 0.05 MG/ML IJ SOLN
INTRAMUSCULAR | Status: AC
  Filled 2013-12-29: qty 2

## 2013-12-29 MED ORDER — CEFAZOLIN SODIUM-DEXTROSE 2-3 GM-% IV SOLR
2.0000 g | INTRAVENOUS | Status: AC
  Administered 2013-12-29: 2 g via INTRAVENOUS

## 2013-12-29 SURGICAL SUPPLY — 50 items
APPLIER CLIP 9.375 MED OPEN (MISCELLANEOUS) ×2
BINDER BREAST LRG (GAUZE/BANDAGES/DRESSINGS) IMPLANT
BINDER BREAST MEDIUM (GAUZE/BANDAGES/DRESSINGS) IMPLANT
BINDER BREAST XLRG (GAUZE/BANDAGES/DRESSINGS) IMPLANT
BINDER BREAST XXLRG (GAUZE/BANDAGES/DRESSINGS) ×2 IMPLANT
BLADE SURG 15 STRL LF DISP TIS (BLADE) ×1 IMPLANT
BLADE SURG 15 STRL SS (BLADE) ×1
CANISTER SUC SOCK COL 7IN (MISCELLANEOUS) ×2 IMPLANT
CANISTER SUCT 1200ML W/VALVE (MISCELLANEOUS) ×2 IMPLANT
CHLORAPREP W/TINT 26ML (MISCELLANEOUS) ×2 IMPLANT
CLIP APPLIE 9.375 MED OPEN (MISCELLANEOUS) ×1 IMPLANT
CLIP TI WIDE RED SMALL 6 (CLIP) IMPLANT
COVER BACK TABLE 60X90IN (DRAPES) ×2 IMPLANT
COVER MAYO STAND STRL (DRAPES) ×2 IMPLANT
COVER PROBE W GEL 5X96 (DRAPES) ×2 IMPLANT
DECANTER SPIKE VIAL GLASS SM (MISCELLANEOUS) IMPLANT
DEVICE DUBIN W/COMP PLATE 8390 (MISCELLANEOUS) ×2 IMPLANT
DRAPE LAPAROSCOPIC ABDOMINAL (DRAPES) ×2 IMPLANT
DRAPE UTILITY XL STRL (DRAPES) ×2 IMPLANT
ELECT COATED BLADE 2.86 ST (ELECTRODE) ×2 IMPLANT
ELECT REM PT RETURN 9FT ADLT (ELECTROSURGICAL) ×2
ELECTRODE REM PT RTRN 9FT ADLT (ELECTROSURGICAL) ×1 IMPLANT
GLOVE BIO SURGEON STRL SZ7.5 (GLOVE) ×2 IMPLANT
GLOVE BIOGEL PI IND STRL 7.5 (GLOVE) ×2 IMPLANT
GLOVE BIOGEL PI IND STRL 8 (GLOVE) ×1 IMPLANT
GLOVE BIOGEL PI INDICATOR 7.5 (GLOVE) ×2
GLOVE BIOGEL PI INDICATOR 8 (GLOVE) ×1
GLOVE ECLIPSE 8.0 STRL XLNG CF (GLOVE) ×4 IMPLANT
GOWN STRL REUS W/ TWL LRG LVL3 (GOWN DISPOSABLE) ×2 IMPLANT
GOWN STRL REUS W/TWL LRG LVL3 (GOWN DISPOSABLE) ×2
KIT MARKER MARGIN INK (KITS) ×2 IMPLANT
LIQUID BAND (GAUZE/BANDAGES/DRESSINGS) ×2 IMPLANT
NDL SAFETY ECLIPSE 18X1.5 (NEEDLE) IMPLANT
NEEDLE HYPO 18GX1.5 SHARP (NEEDLE)
NEEDLE HYPO 25X1 1.5 SAFETY (NEEDLE) ×2 IMPLANT
NS IRRIG 1000ML POUR BTL (IV SOLUTION) ×2 IMPLANT
PACK BASIN DAY SURGERY FS (CUSTOM PROCEDURE TRAY) ×2 IMPLANT
PENCIL BUTTON HOLSTER BLD 10FT (ELECTRODE) ×2 IMPLANT
SLEEVE SCD COMPRESS KNEE MED (MISCELLANEOUS) ×2 IMPLANT
SPONGE LAP 4X18 X RAY DECT (DISPOSABLE) ×2 IMPLANT
STAPLER VISISTAT 35W (STAPLE) IMPLANT
SUT MNCRL AB 4-0 PS2 18 (SUTURE) ×2 IMPLANT
SUT SILK 2 0 SH (SUTURE) IMPLANT
SUT VIC AB 3-0 SH 27 (SUTURE) ×2
SUT VIC AB 3-0 SH 27X BRD (SUTURE) ×2 IMPLANT
SYR CONTROL 10ML LL (SYRINGE) ×2 IMPLANT
TOWEL OR 17X24 6PK STRL BLUE (TOWEL DISPOSABLE) ×2 IMPLANT
TOWEL OR NON WOVEN STRL DISP B (DISPOSABLE) ×2 IMPLANT
TUBE CONNECTING 20X1/4 (TUBING) ×2 IMPLANT
YANKAUER SUCT BULB TIP NO VENT (SUCTIONS) ×2 IMPLANT

## 2013-12-29 NOTE — Progress Notes (Signed)
Assisted Dr. Edmond Fitzgerald with left, ultrasound guided, pectoralis block. Side rails up, monitors on throughout procedure. See vital signs in flow sheet. Tolerated Procedure well. 

## 2013-12-29 NOTE — Anesthesia Procedure Notes (Addendum)
Anesthesia Regional Block:  Pectoralis block  Pre-Anesthetic Checklist: ,, timeout performed, Correct Patient, Correct Site, Correct Laterality, Correct Procedure, Correct Position, site marked, Risks and benefits discussed, pre-op evaluation, post-op pain management  Laterality: Left  Prep: Maximum Sterile Barrier Precautions used and chloraprep       Needles:  Injection technique: Single-shot  Needle Type: Echogenic Stimulator Needle     Needle Length: 9cm 9 cm Needle Gauge: 21 and 21 G    Additional Needles:  Procedures: ultrasound guided (picture in chart) Pectoralis block Narrative:  Start time: 12/29/2013 9:19 AM End time: 12/29/2013 9:27 AM Injection made incrementally with aspirations every 5 mL. Anesthesiologist: Roderic Palau E  Additional Notes: 2% Lidocaine skin wheel.   Procedure Name: LMA Insertion Date/Time: 12/29/2013 10:54 AM Performed by: Sehaj Mcenroe Pre-anesthesia Checklist: Patient identified, Emergency Drugs available, Suction available and Patient being monitored Patient Re-evaluated:Patient Re-evaluated prior to inductionOxygen Delivery Method: Circle System Utilized Preoxygenation: Pre-oxygenation with 100% oxygen Intubation Type: IV induction Ventilation: Mask ventilation without difficulty LMA: LMA inserted LMA Size: 4.0 Number of attempts: 1 Airway Equipment and Method: bite block Placement Confirmation: positive ETCO2 Tube secured with: Tape Dental Injury: Teeth and Oropharynx as per pre-operative assessment

## 2013-12-29 NOTE — Op Note (Signed)
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  Anesthesia: LMA with 0.25% Sensorcaine local with epinephrine and regional block by anesthesia  Specimen: #1 left breast mass with clip and seed verify the radiograph grossly negative margin #2      1 left axillary sentinel lymph node 3.        1 non-sentinel node  EBL: Minimal  IV fluids: 600 mL crystalloid  Indications for procedure: Patient is a 63 year old female with left breast cancer stage I. Operative options were discussed to include breast conservation versus mastectomy and reconstruction. We discussed sentinel lymph node mapping as well.The procedure has been discussed with the patient. Alternatives to surgery have been discussed with the patient.  Risks of surgery include bleeding,  Infection,  Seroma formation, death,  and the need for further surgery.   The patient understands and wishes to proceed.Sentinel lymph node mapping and dissection has been discussed with the patient.  Risk of bleeding,  Infection,  Seroma formation,  Additional procedures,,  Shoulder weakness ,  Shoulder stiffness,  Nerve and blood vessel injury and reaction to the mapping dyes have been discussed.  Alternatives to surgery have been discussed with the patient.  The patient agrees to proceed.  Description of procedure: Patient met in holding area. Neoprobe used to verify clip in left breast. Questions were answered. Underwent injection by nuclear medicine of left breast. Patient taken back to the operating room and placed upon the OR table. After induction of LMA anesthesia, left breast was prepped and draped in a sterile fashion. Timeout was done and antibiotics were given anatomic fashion. Lumpectomy was done first. Neoprobe was used to identify hotspot in left upper inner breast.  Curvilinear incision was made over the hospital and all tissue around the hotspot was excised. Radiograph revealed both the reactive chip and biopsy clip to be in specimen with gross margins being negative. We was made hemostatic.  Neoprobe used on technetium settings to identify left axillary sentinel node. Hotspot identified incision made over the hotspot in the left axilla. Dissection was carried down and 1 hot level I node was identified and removed. A second non-sentinel node was removed. Background counts were less than 10% of the injection site in the breast. No palpable abnormality noted. We was irrigated and closed in layers with 3-0 Vicryl for Monocryl.  Left breast lumpectomy site was irrigated and made hemostatic with cautery. The cavity was clipped. We was closed in layers with deep 3-0 Vicryl and 4-0 Monocryl subcuticular stitch. Dermabond applied to both incisions. Binder placed. All counts were found to be correct. Patient was awoke extubated taken to recovery in satisfactory condition.

## 2013-12-29 NOTE — Transfer of Care (Signed)
Immediate Anesthesia Transfer of Care Note  Patient: Rachel Herman  Procedure(s) Performed: Procedure(s): LEFT BREAST RADIOACTIVE SEED LOCALIZED LUMPECTOMY WITH SENTINEL LYMPH NODE MAPPING (Left)  Patient Location: PACU  Anesthesia Type:GA combined with regional for post-op pain  Level of Consciousness: awake, alert , oriented and patient cooperative  Airway & Oxygen Therapy: Patient Spontanous Breathing and Patient connected to face mask oxygen  Post-op Assessment: Report given to PACU RN and Post -op Vital signs reviewed and stable  Post vital signs: Reviewed and stable  Complications: No apparent anesthesia complications

## 2013-12-29 NOTE — Interval H&P Note (Signed)
History and Physical Interval Note:  12/29/2013 10:34 AM  Rachel Herman  has presented today for surgery, with the diagnosis of Left Breast Cancer  The various methods of treatment have been discussed with the patient and family. After consideration of risks, benefits and other options for treatment, the patient has consented to  Procedure(s): LEFT BREAST RADIOACTIVE SEED LOCALIZED LUMPECTOMY WITH SENTINEL LYMPH NODE MAPPING (Left) as a surgical intervention .  The patient's history has been reviewed, patient examined, no change in status, stable for surgery.  I have reviewed the patient's chart and labs.  Questions were answered to the patient's satisfaction.     Starkeisha Vanwinkle A.

## 2013-12-29 NOTE — Discharge Instructions (Signed)
[facility] °Office Phone Number [phone] ° °BREAST BIOPSY/ PARTIAL MASTECTOMY: POST OP INSTRUCTIONS ° °Always review your discharge instruction sheet given to you by the facility where your surgery was performed. ° °IF YOU HAVE DISABILITY OR FAMILY LEAVE FORMS, YOU MUST BRING THEM TO THE OFFICE FOR PROCESSING.  DO NOT GIVE THEM TO YOUR DOCTOR. ° °1. A prescription for pain medication may be given to you upon discharge.  Take your pain medication as prescribed, if needed.  If narcotic pain medicine is not needed, then you may take acetaminophen (Tylenol) or ibuprofen (Advil) as needed. °2. Take your usually prescribed medications unless otherwise directed °3. If you need a refill on your pain medication, please contact your pharmacy.  They will contact our office to request authorization.  Prescriptions will not be filled after 5pm or on week-ends. °4. You should eat very light the first 24 hours after surgery, such as soup, crackers, pudding, etc.  Resume your normal diet the day after surgery. °5. Most patients will experience some swelling and bruising in the breast.  Ice packs and a good support bra will help.  Swelling and bruising can take several days to resolve.  °6. It is common to experience some constipation if taking pain medication after surgery.  Increasing fluid intake and taking a stool softener will usually help or prevent this problem from occurring.  A mild laxative (Milk of Magnesia or Miralax) should be taken according to package directions if there are no bowel movements after 48 hours. °7. Unless discharge instructions indicate otherwise, you may remove your bandages 24-48 hours after surgery, and you may shower at that time.  You may have steri-strips (small skin tapes) in place directly over the incision.  These strips should be left on the skin for 7-10 days.  If your surgeon used skin glue on the incision, you may shower in 24 hours.  The glue will flake off over the  next 2-3 weeks.  Any sutures or staples will be removed at the office during your follow-up visit. °8. ACTIVITIES:  You may resume regular daily activities (gradually increasing) beginning the next day.  Wearing a good support bra or sports bra minimizes pain and swelling.  You may have sexual intercourse when it is comfortable. °a. You may drive when you no longer are taking prescription pain medication, you can comfortably wear a seatbelt, and you can safely maneuver your car and apply brakes. °b. RETURN TO WORK:  ______________________________________________________________________________________ °9. You should see your doctor in the office for a follow-up appointment approximately two weeks after your surgery.  Your doctor’s nurse will typically make your follow-up appointment when she calls you with your pathology report.  Expect your pathology report 2-3 business days after your surgery.  You may call to check if you do not hear from us after three days. °10. OTHER INSTRUCTIONS: _______________________________________________________________________________________________ _____________________________________________________________________________________________________________________________________ °_____________________________________________________________________________________________________________________________________ °_____________________________________________________________________________________________________________________________________ ° °WHEN TO CALL YOUR DOCTOR: °1. Fever over 101.0 °2. Nausea and/or vomiting. °3. Extreme swelling or bruising. °4. Continued bleeding from incision. °5. Increased pain, redness, or drainage from the incision. ° °The clinic staff is available to answer your questions during regular business hours.  Please don’t hesitate to call and ask to speak to one of the nurses for clinical concerns.  If you have a medical emergency, go to the nearest  emergency room or call 911.  A surgeon from [facility] is always on call at the hospital. ° °For further questions, please visit [email or web address]  ° ° °  Post Anesthesia Home Care Instructions ° °Activity: °Get plenty of rest for the remainder of the day. A responsible adult should stay with you for 24 hours following the procedure.  °For the next 24 hours, DO NOT: °-Drive a car °-Operate machinery °-Drink alcoholic beverages °-Take any medication unless instructed by your physician °-Make any legal decisions or sign important papers. ° °Meals: °Start with liquid foods such as gelatin or soup. Progress to regular foods as tolerated. Avoid greasy, spicy, heavy foods. If nausea and/or vomiting occur, drink only clear liquids until the nausea and/or vomiting subsides. Call your physician if vomiting continues. ° °Special Instructions/Symptoms: °Your throat may feel dry or sore from the anesthesia or the breathing tube placed in your throat during surgery. If this causes discomfort, gargle with warm salt water. The discomfort should disappear within 24 hours. ° °

## 2013-12-29 NOTE — Anesthesia Preprocedure Evaluation (Addendum)
Anesthesia Evaluation  Patient identified by MRN, date of birth, ID band Patient awake    Reviewed: Allergy & Precautions, H&P , NPO status , Patient's Chart, lab work & pertinent test results  History of Anesthesia Complications (+) PONV  Airway Mallampati: III  TM Distance: >3 FB Neck ROM: Full    Dental no notable dental hx. (+) Teeth Intact, Dental Advisory Given   Pulmonary neg pulmonary ROS,  breath sounds clear to auscultation  Pulmonary exam normal       Cardiovascular hypertension, Pt. on medications Rhythm:Regular Rate:Normal     Neuro/Psych negative neurological ROS  negative psych ROS   GI/Hepatic negative GI ROS, Neg liver ROS,   Endo/Other  diabetes, Type 2, Oral Hypoglycemic AgentsMorbid obesity  Renal/GU negative Renal ROS  negative genitourinary   Musculoskeletal   Abdominal   Peds  Hematology negative hematology ROS (+)   Anesthesia Other Findings   Reproductive/Obstetrics negative OB ROS                            Anesthesia Physical Anesthesia Plan  ASA: III  Anesthesia Plan: General and Regional   Post-op Pain Management:    Induction: Intravenous  Airway Management Planned: LMA  Additional Equipment:   Intra-op Plan:   Post-operative Plan: Extubation in OR  Informed Consent: I have reviewed the patients History and Physical, chart, labs and discussed the procedure including the risks, benefits and alternatives for the proposed anesthesia with the patient or authorized representative who has indicated his/her understanding and acceptance.   Dental advisory given  Plan Discussed with: CRNA  Anesthesia Plan Comments:         Anesthesia Quick Evaluation

## 2013-12-29 NOTE — Anesthesia Postprocedure Evaluation (Signed)
  Anesthesia Post-op Note  Patient: Rachel Herman  Procedure(s) Performed: Procedure(s): LEFT BREAST RADIOACTIVE SEED LOCALIZED LUMPECTOMY WITH SENTINEL LYMPH NODE MAPPING (Left)  Patient Location: PACU  Anesthesia Type:General and block  Level of Consciousness: awake and alert   Airway and Oxygen Therapy: Patient Spontanous Breathing  Post-op Pain: moderate  Post-op Assessment: Post-op Vital signs reviewed, Patient's Cardiovascular Status Stable and Respiratory Function Stable  Post-op Vital Signs: Reviewed  Filed Vitals:   12/29/13 1245  BP: 138/90  Pulse: 78  Temp:   Resp: 16    Complications: No apparent anesthesia complications

## 2013-12-29 NOTE — H&P (View-Only) (Signed)
Rachel Herman 12/13/2013 2:38 PM Location: [facility] Patient #: [id number] DOB: 1950/12/09 Married / Language: English / Race: White Female History of Present Illness [name] A. Kendall Arnell MD; 12/13/2013 9:29 PM) Patient words: left breast cancer   Breast, left, needle core biopsy, mass, 10 o'clock - INVASIVE DUCTAL CARCINOMA, SEE COMMENT. Diagnosis Note While grading is best performed at the time of excision, the carcinoma appears high grade. Prognostic markers will be ordered and reported in an addendum. Dr. Lyndon Code has reviewed the case. The case was called to [facility] on 12/10/2013. Vicente Males MD Pathologist, Electronic Signature (Case signed 12/10/2013) Specimen    CLINICAL DATA: Callback from screening mammogram for a possible mass left breast  EXAM: DIGITAL DIAGNOSTIC LEFT MAMMOGRAM  ULTRASOUND LEFT BREAST  COMPARISON: November 11, 2013  ACR Breast Density Category b: There are scattered areas of fibroglandular density.  FINDINGS: Spot compression CC and MLO views of the left breast are submitted. There is a spiculated mass in the upper medial left breast.  Ultrasound is performed, showing angulated hypoechoic mass at the left breast 10 o'clock 18 cm from nipple measuring 1.82 x 0.71 x 0.88 cm. Ultrasound of the left axilla is negative.  IMPRESSION: Suspicious findings.  RECOMMENDATION: Ultrasound-guided core biopsy left breast mass.  I have discussed the findings and recommendations with the patient. Results were also provided in writing at the conclusion of the visit. If applicable, a reminder letter will be sent to the patient regarding the next appointment.  BI-RADS CATEGORY 4: Suspicious.   Electronically Signed By: [name] M.D. On: 11/23/2013 09:41.  The patient is a 63 year old female who presents with breast cancer. The patient is being seen for a consultation for Stage I invasive ductal carcinoma  of the left breast. Initial presentation was 3 week(s) ago for an abnormal mammogram. Current diagnosis was determined by mammography and breast ultrasound. This problem has not been previously evaluated. This problem has not been previously treated. Symptoms include fatigue. The patient has a surgical history of oophorectomy. Other Problems [name], [place]; 12/13/2013 2:49 PM) Back Pain Gastroesophageal Reflux Disease High blood pressure  Past Surgical History [name], [place]; 12/13/2013 2:49 PM) Breast Biopsy Left. Hysterectomy (not due to cancer) - Complete Resection of Stomach  Diagnostic Studies History [name], [place]; 12/13/2013 2:49 PM) Colonoscopy never Mammogram within last year  Allergies [name], [place]; 12/13/2013 2:50 PM) No Known Drug Allergies 12/13/2013  Medication History [name], [place]; 12/13/2013 2:51 PM) Lisinopril-Hydrochlorothiazide (20-25MG  Tablet, Oral) Active. MetFORMIN HCl (500MG  Tablet, Oral) Active. Atorvastatin Calcium (40MG  Tablet, Oral) Active. Ibuprofen (800MG  Tablet, Oral) Active. Multi Vitamin Daily (Oral) Active. Aspirin EC (325MG  Tablet DR, Oral) Active.  Social History [name], [place]; 12/13/2013 2:49 PM) Caffeine use Coffee. No drug use Tobacco use Never smoker.  Family History [name], [place]; 12/13/2013 2:49 PM) Diabetes Mellitus Mother. Heart Disease Father. Hypertension Mother, Sister.  Pregnancy / Birth History [name], [place]; 12/13/2013 2:49 PM) Age of menopause <44 Para 3     Review of Systems [name] CMA; 12/13/2013 2:49 PM) General Not Present- Appetite Loss, Chills, Fatigue, Fever, Night Sweats, Weight Gain and Weight Loss. HEENT Not Present- Earache, Hearing Loss, Hoarseness, Nose Bleed, Oral Ulcers, Ringing in the Ears, Seasonal Allergies, Sinus Pain, Sore Throat, Visual Disturbances, Wears glasses/contact lenses and Yellow  Eyes. Respiratory Not Present- Bloody sputum, Chronic Cough, Difficulty Breathing, Snoring and Wheezing. Gastrointestinal Present- Constipation  and Nausea. Not Present- Abdominal Pain, Bloating, Bloody Stool, Change in Bowel Habits, Chronic diarrhea, Difficulty Swallowing, Excessive gas, Gets full quickly at meals, Hemorrhoids, Indigestion, Rectal Pain and Vomiting. Female Genitourinary Not Present- Frequency, Nocturia, Painful Urination, Pelvic Pain and Urgency. Musculoskeletal Present- Back Pain. Not Present- Joint Pain, Joint Stiffness, Muscle Pain, Muscle Weakness and Swelling of Extremities. Psychiatric Not Present- Anxiety, Bipolar, Change in Sleep Pattern, Depression, Fearful and Frequent crying. Endocrine Present- Hair Changes and New Diabetes. Not Present- Cold Intolerance, Excessive Hunger, Heat Intolerance and Hot flashes. Hematology Not Present- Easy Bruising, Excessive bleeding, Gland problems, HIV and Persistent Infections.  Vitals [name] CMA; 12/13/2013 2:49 PM) 12/13/2013 2:48 PM Weight: 244.13 lb Height: 64in Body Surface Area: 2.24 m Body Mass Index: 41.9 kg/m Pulse: 88 (Regular)  Resp.: 14 (Unlabored)  BP: 136/86 (Sitting, Left Arm, Standard)     Physical Exam (Jaquasia Doscher A. Judea Riches MD; 12/13/2013 9:33 PM)  General Mental Status-Alert. General Appearance-Consistent with stated age. Hydration-Well hydrated. Voice-Normal.  Head and Neck Head-normocephalic, atraumatic with no lesions or palpable masses.  Chest and Lung Exam Chest and lung exam reveals -quiet, even and easy respiratory effort with no use of accessory muscles and on auscultation, normal breath sounds, no adventitious sounds and normal vocal resonance. Inspection Chest Wall - Normal. Back - normal.  Breast Breast - Left-Symmetric, Mildly tender and Biopsy scar, No Dimpling, No Peau d' Orange. Note:blister biopsy site upper breast Breast - Right-Symmetric, Non  Tender, No Dimpling, No Peau d' Orange.  Cardiovascular Cardiovascular examination reveals -on palpation PMI is normal in location and amplitude, no palpable S3 or S4. Normal cardiac borders., normal heart sounds, regular rate and rhythm with no murmurs, carotid auscultation reveals no bruits and normal pedal pulses bilaterally.  Neurologic Neurologic evaluation reveals -alert and oriented x 3 with no impairment of recent or remote memory. Mental Status-Normal.  Musculoskeletal Normal Exam - Left-Upper Extremity Strength Normal and Lower Extremity Strength Normal. Normal Exam - Right-Upper Extremity Strength Normal, Lower Extremity Weakness.  Lymphatic Axillary  General Axillary Region: Bilateral - Description - Normal.    Assessment & Plan (Sabre Leonetti A. Bradin Mcadory MD; 12/14/2013 8:49 AM)  BREAST CANCER, LEFT (174.9  C50.912) Impression: Risk of lumpectomy include bleeding, infection, seroma, more surgery, use of seed/wire, wound care, cosmetic deformity and the need for other treatments, death , blood clots, death. Pt agrees to proceed. Risk of sentinel lymph node mapping include bleeding, infection, lymphedema, shoulder pain. stiffness, dye allergy. cosmetic deformity , blood clots, death, need for more surgery. Pt agres to proceed.  PT OPTED FOR BREAST CONSERVATION OVER MASTECTOMY/ RECONSTRUCTION. LEFT BREAST SEED LOCALIZED LUMPECTOMY AND SLN MAPPING.  Current Plans Referred to Genetic Counseling, for evaluation and follow up (Medical Genetics). Referred to Oncology, for evaluation and follow up (Oncology). Referred to Radiation Oncology, for evaluation and follow up (Radiation Oncology). Referred to Physical Therapy, for evaluation and follow up (Physical Therapy). CCS Consent - BASIC (Gross): discussed with patient and provided information. Pt Education - CSS Breast Biopsy Instructions (FLB): discussed with patient and provided information. Pt Education - CCS Breast Biopsy  [facility]

## 2013-12-29 NOTE — Progress Notes (Signed)
Location of Breast Cancer: Left Breast 10 o'clock position, Upper medial   Histology per Pathology Report: Diagnosis 12/09/13 : Breast, left, needle core biopsy, mass, 10 o'clock- INVASIVE DUCTAL CARCINOMA, SEE COMMENT  Receptor Status: ER( +  ), PR (  +  ), Her2-neu ( - no amplification    )  Did patient present with symptoms (if so, please note symptoms) or was this found on screening mammography?: abnormal screening,   Past/Anticipated interventions by surgeon, if QBV:QXIH  Breast Radioactive seed Localized Lumpectomy  With Sentinel Lymph Node Mapping 12/29/13 by Dr. Erroll Luna , path pending  Past/Anticipated interventions by medical oncology, if any: Chemotherapy : appt Dr. Burr Medico , MD; 01/10/14  Lymphedema issues, if any:   No  Pain issues, if any:  Throbbing, tenderness in left breast at times,  SAFETY ISSUES:  Prior radiation? NO  Pacemaker/ICD? NO  Possible current pregnancy? NO  Is the patient on methotrexate? NO  Current Complaints / other details:  Married,  G3P3, menopause =44, Mother DM,HTN, Father=Heart disease,sister breast cancer, paternal grandmother=stomach cancer age 61 Hysterectomy,  Complete, 0/38/8828, complicated with ileus,  no malignancy, , stomach resection, never smoker, no drug or alcohol use Allergies: NKDA    Rebecca Eaton, RN 12/29/2013,12:29 PM

## 2014-01-03 ENCOUNTER — Ambulatory Visit
Admission: RE | Admit: 2014-01-03 | Discharge: 2014-01-03 | Disposition: A | Discharge status: Home / Self Care | Payer: Medicaid Other | Source: Ambulatory Visit | Attending: Radiation Oncology | Admitting: Radiation Oncology

## 2014-01-03 ENCOUNTER — Telehealth: Payer: Self-pay | Admitting: *Deleted

## 2014-01-03 ENCOUNTER — Encounter: Payer: Self-pay | Admitting: Radiation Oncology

## 2014-01-03 VITALS — BP 128/68 | HR 83 | Temp 98.4°F | Resp 20 | Ht 64.0 in | Wt 245.9 lb

## 2014-01-03 DIAGNOSIS — C50219 Malignant neoplasm of upper-inner quadrant of unspecified female breast: Secondary | ICD-10-CM | POA: Diagnosis not present

## 2014-01-03 DIAGNOSIS — C50212 Malignant neoplasm of upper-inner quadrant of left female breast: Secondary | ICD-10-CM

## 2014-01-03 DIAGNOSIS — Z510 Encounter for antineoplastic radiation therapy: Secondary | ICD-10-CM | POA: Diagnosis not present

## 2014-01-03 HISTORY — DX: Malignant neoplasm of unspecified site of unspecified female breast: C50.919

## 2014-01-03 NOTE — Telephone Encounter (Signed)
Called patient home to see if patient had left for her 1130 appt with Dr.Moody, left vm  11:41 AM

## 2014-01-03 NOTE — Progress Notes (Signed)
Please see the Nurse Progress Note in the MD Initial Consult Encounter for this patient.Please see the Nurse Progress Note in the MD Initial Consult Encounter for this patient.

## 2014-01-05 ENCOUNTER — Encounter: Payer: Self-pay | Admitting: Radiation Oncology

## 2014-01-05 DIAGNOSIS — C50212 Malignant neoplasm of upper-inner quadrant of left female breast: Secondary | ICD-10-CM | POA: Insufficient documentation

## 2014-01-05 NOTE — Progress Notes (Signed)
Radiation Oncology         [phone] ________________________________  Name: Rachel Herman MRN: [id number]  Date: 01/03/2014  DOB: [birth year]  FX:[id number] A, MD  Erroll Luna, MD     REFERRING PHYSICIAN: Erroll Luna, MD   DIAGNOSIS: The primary encounter diagnosis was Malignant neoplasm of upper-inner quadrant of left female breast. A diagnosis of Malignant neoplasm of upper inner quadrant of female breast, left was also pertinent to this visit.  Malignant neoplasm of upper inner quadrant of female breast   Staging form: Breast, AJCC 7th Edition     Clinical: Stage IA (T1c, N0, M0) - Unsigned     Pathologic: No stage assigned - Unsigned    HISTORY OF PRESENT ILLNESS::Rachel Herman is a 63 y.o. female who is seen for an initial consultation visit regarding the patient's diagnosis of breast cancer.  The patient was found to have suspicious findings within the left breast on initial mammogram. The patient has not had symptoms prior to this study. A diagnostic mammogram and breast ultrasound confirmed this finding. On ultrasound, the tumor measured 1.8 cm and was present in the upper/inner quadrant.  A biopsy was performed. 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   The patient has not undergone an MRI scan of the breasts.  The patient proceeded to undergo surgical resection of the breast cancer.  Pathology is pending. Receptor studies previously indicated that the tumor is estrogen receptor positive, progesterone receptor positive, and HER-2/neu negative. The Ki-67 staining was 73 %.  The patient is seeing medical oncology in the near future to discuss possible systemic treatment.  The patient states that she has done well postoperatively. No unexpected difficulties with pain, infection, or other issues.    PREVIOUS RADIATION THERAPY: No   PAST MEDICAL HISTORY:  has a past medical history of Obesity, morbid, BMI 40.0-49.9;  Hypertension (2014); Hyperlipidemia; PONV (postoperative nausea and vomiting); Breast cancer (12/09/13); and Diabetes mellitus (09/16/13).     PAST SURGICAL HISTORY: Past Surgical History  Procedure Laterality Date  . Abdominal hysterectomy N/A 09/20/2013    Procedure: HYSTERECTOMY ABDOMINAL;  Surgeon: Osborne Oman, MD;  Location: [facility];  Service: Gynecology;  Laterality: N/A;  . Salpingoophorectomy  09/20/2013    Procedure: SALPINGO OOPHORECTOMY;  Surgeon: Osborne Oman, MD;  Location: [facility];  Service: Gynecology;;  . Laparoscopic assisted vaginal hysterectomy  09/20/2013    Procedure: LAPAROSCOPIC ASSISTED VAGINAL HYSTERECTOMY;  Surgeon: Osborne Oman, MD;  Location: [facility];  Service: Gynecology;;  PT WAS EXAMINED WHILE UP IN [facility] AND IT WAS DECIDED TO OPEN PT DUE TO LARGE MASS  . Breast lumpectomy with axillary lymph node biopsy Left 12/29/13    left breast   . Radioactive seed guided mastectomy with axillary sentinel lymph node biopsy Left 12/29/2013    Procedure: LEFT BREAST RADIOACTIVE SEED LOCALIZED LUMPECTOMY WITH SENTINEL LYMPH NODE MAPPING;  Surgeon: Erroll Luna, MD;  Location: [facility];  Service: General;  Laterality: Left;     FAMILY HISTORY: family history includes Breast cancer in her sister; Cancer (age of onset: 34) in her paternal grandmother; Diabetes in her brother, maternal aunt, mother, sister, and son.   SOCIAL HISTORY:  reports that she has never smoked. She has never used smokeless tobacco. She reports that she does not drink alcohol or use illicit drugs.   ALLERGIES: Review of patient's allergies indicates no known allergies.   MEDICATIONS:  Current Outpatient Prescriptions  Medication Sig Dispense Refill  .  ACCU-CHEK FASTCLIX LANCETS MISC 1 Units by Percutaneous route 4 (four) times daily. 100 each 12  . aspirin 325 MG tablet Take 325 mg by mouth once.    . atorvastatin (LIPITOR) 40 MG tablet Take 1 tablet (40 mg total) by  mouth daily. 90 tablet 3  . Blood Glucose Monitoring Suppl (ACCU-CHEK NANO SMARTVIEW) W/DEVICE KIT 1 kit by Subdermal route as directed. Check blood sugars for fasting, and two hours after breakfast, lunch and dinner (4 checks daily) 1 kit 0  . Docusate Sodium (DSS) 100 MG CAPS Take 100 mg by mouth 2 (two) times daily. 30 each 3  . glucose blood (ACCU-CHEK SMARTVIEW) test strip Use as instructed to check blood sugars 100 each 12  . ibuprofen (ADVIL,MOTRIN) 800 MG tablet Take 1 tablet (800 mg total) by mouth every 8 (eight) hours as needed. 30 tablet 1  . lisinopril-hydrochlorothiazide (PRINZIDE,ZESTORETIC) 20-25 MG per tablet Take 1 tablet by mouth daily. 90 tablet 3  . magnesium hydroxide (MILK OF MAGNESIA) 400 MG/5ML suspension Take 15 mLs by mouth 3 (three) times daily. 360 mL 0  . metFORMIN (GLUCOPHAGE) 500 MG tablet Take 1 tablet (500 mg total) by mouth 2 (two) times daily with a meal. 60 tablet 2  . Multiple Vitamins-Minerals (MULTIVITAMIN WITH MINERALS) tablet Take 1 tablet by mouth daily.    . oxyCODONE-acetaminophen (ROXICET) 5-325 MG per tablet Take 1 tablet by mouth every 4 (four) hours as needed. (Patient not taking: Reported on 01/03/2014) 30 tablet 0  . promethazine (PHENERGAN) 25 MG tablet Take 1 tablet (25 mg total) by mouth every 6 (six) hours as needed for nausea or vomiting. (Patient not taking: Reported on 01/03/2014) 30 tablet 2   No current facility-administered medications for this encounter.     REVIEW OF SYSTEMS:  A 15 point review of systems is documented in the electronic medical record. This was obtained by the nursing staff. However, I reviewed this with the patient to discuss relevant findings and make appropriate changes.  Pertinent items are noted in HPI.    PHYSICAL EXAM:  height is 5' 4" (1.626 m) and weight is 245 lb 14.4 oz (111.54 kg). Her oral temperature is 98.4 F (36.9 C). Her blood pressure is 128/68 and her pulse is 83. Her respiration is 20.   ECOG =  1  0 - Asymptomatic (Fully active, able to carry on all predisease activities without restriction)  1 - Symptomatic but completely ambulatory (Restricted in physically strenuous activity but ambulatory and able to carry out work of a light or sedentary nature. For example, light housework, office work)  2 - Symptomatic, <50% in bed during the day (Ambulatory and capable of all self care but unable to carry out any work activities. Up and about more than 50% of waking hours)  3 - Symptomatic, >50% in bed, but not bedbound (Capable of only limited self-care, confined to bed or chair 50% or more of waking hours)  4 - Bedbound (Completely disabled. Cannot carry on any self-care. Totally confined to bed or chair)  5 - Death   Oken MM, Creech RH, Tormey DC, et al. (1982). "Toxicity and response criteria of the Eastern Cooperative Oncology Group". Am. J. Clin. Oncol. 5 (6): 649-55  General: Well-developed, in no acute distress HEENT: Normocephalic, atraumatic; oral cavity clear Neck: Supple without any lymphadenopathy Cardiovascular: Regular rate and rhythm Respiratory: Clear to auscultation bilaterally Breasts: Status post lumpectomy with the incision healing well. Post surgical change. No suspicious additional masses within   the left breast and no axillary adenopathy on the left. Unremarkable breast exam on the right with no axillary adenopathy. GI: Soft, nontender, normal bowel sounds Extremities: No edema present Neuro: No focal deficits     LABORATORY DATA:  Lab Results  Component Value Date   WBC 9.6 12/27/2013   HGB 10.8* 12/29/2013   HCT 41.9 12/27/2013   MCV 87.1 12/27/2013   PLT 319 12/27/2013   Lab Results  Component Value Date   NA 141 12/27/2013   K 3.7 12/27/2013   CL 100 12/27/2013   CO2 27 12/27/2013   Lab Results  Component Value Date   ALT 58* 12/27/2013   AST 65* 12/27/2013   ALKPHOS 67 12/27/2013   BILITOT 0.4 12/27/2013      RADIOGRAPHY: Nm Sentinel  Node Inj-no Rpt (breast)  12/29/2013   CLINICAL DATA: cancer breast left   Sulfur colloid was injected intradermally by the nuclear medicine  technologist for breast cancer sentinel node localization.    Mm Breast Surgical Specimen  12/29/2013   CLINICAL DATA:  Patient status post left breast lumpectomy.  EXAM: SPECIMEN RADIOGRAPH OF THE LEFT BREAST  COMPARISON:  Previous exam(s)  FINDINGS: Status post excision of the left breast. The radioactive seed and biopsy marker clip are present, completely intact, and are marked for pathology.  IMPRESSION: Specimen radiograph of the left breast.   Electronically Signed   By: Drew  Davis M.D.   On: 12/29/2013 11:25   Mm Digital Diagnostic Unilat L  12/09/2013   CLINICAL DATA:  Status post ultrasound-guided core biopsy of mass in the upper and inner quadrant of the left breast.  EXAM: DIAGNOSTIC LEFT MAMMOGRAM POST ULTRASOUND BIOPSY  COMPARISON:  Previous exams  FINDINGS: Mammographic images were obtained following ultrasound guided biopsy of mass in the 10 o'clock location of the left breast. A coil shaped clip is identified in the mass in the upper inner quadrant as expected.  IMPRESSION: Tissue marker clip is in expected location following biopsy.  Final Assessment: Post Procedure Mammograms for Marker Placement   Electronically Signed   By: Beth  Brown M.D.   On: 12/09/2013 15:44   Mm Radiologist Eval And Mgmt  12/10/2013   EXAM: ESTABLISHED PATIENT OFFICE VISIT - LEVEL II  CHIEF COMPLAINT: 62-year-old female returns for results of ultrasound-guided left breast biopsy performed on 12/09/2013. Patient has no complaints with her biopsy site.  Current Pain Level: 1  HISTORY OF PRESENT ILLNESS: 61-year-old female with suspicious hypoechoic mass in the upper inner left breast, biopsied on 12/09/2013.  EXAM: The left breast biopsy site is clean and dry. There is no evidence of hematoma or signs of infection.  PATHOLOGY: HIGH-GRADE INVASIVE DUCTAL CARCINOMA. Histology  correlates to imaging findings.  ASSESSMENT AND PLAN: ASSESSMENT AND PLAN Surgery/oncology consultation. An appointment with Dr. Cornett ([facility]) has been scheduled for 12/13/2013 and the patient informed.  The patient was given breast cancer educational material and resource information. Her questions were answered.   Electronically Signed   By: Jeff  Hu M.D.   On: 12/10/2013 13:57   Mm Lt Radioactive Seed Loc Mammo Guide  12/27/2013   CLINICAL DATA:  Patient presents for radioactive seed localization prior to lumpectomy on 12/29/2013.  EXAM: MAMMOGRAPHIC GUIDED RADIOACTIVE SEED LOCALIZATION OF THE LEFT BREAST  COMPARISON:  Previous exam(s)  FINDINGS: Patient presents for radioactive seed localization prior to lumpectomy for left breast cancer. I met with the patient and we discussed the procedure of seed localization including benefits   and alternatives. We discussed the high likelihood of a successful procedure. We discussed the risks of the procedure including infection, bleeding, tissue injury and further surgery. We discussed the low dose of radioactivity involved in the procedure. Informed, written consent was given.  The usual time-out protocol was performed immediately prior to the procedure.  Using mammographic guidance, sterile technique, 2% lidocaine and an I-125 radioactive seed, mass in the upper inner quadrant of the left breast is localized using a cephalad approach. The follow-up mammogram images confirm the seed in the expected location and are marked for Dr. Cornett.  Follow-up survey of the patient confirms presence of radioactive seed.  Order number of I-125 seed:  [id number].  Total activity:  0.250 mCi  Reference Date: December 03, 2013  The patient tolerated the procedure well and was [facility]. She was given instructions regarding seed removal.  IMPRESSION: Radioactive seed localization of the left breast. No apparent complications.   Electronically  Signed   By: Beth  Brown M.D.   On: 12/27/2013 14:50   Us Lt Breast Bx W Loc Dev 1st Lesion Img Bx Spec Us Guide  12/09/2013   CLINICAL DATA:  The patient returns for ultrasound-guided core biopsy of mass in the left breast 10 o'clock location.  EXAM: ULTRASOUND GUIDED LEFT BREAST CORE NEEDLE BIOPSY  COMPARISON:  11/11/2013  PROCEDURE: I met with the patient and we discussed the procedure of ultrasound-guided biopsy, including benefits and alternatives. We discussed the high likelihood of a successful procedure. We discussed the risks of the procedure including infection, bleeding, tissue injury, clip migration, and inadequate sampling. Informed written consent was given. The usual time-out protocol was performed immediately prior to the procedure.  Using sterile technique and 2% Lidocaine as local anesthetic, under direct ultrasound visualization, a 12 gauge vacuum-assisteddevice was used to perform biopsy of mass in the 10 o'clock location of the left breast using a lateral approach. At the conclusion of the procedure, a coil shaped tissue marker clip was deployed into the biopsy cavity. Follow-up 2-view mammogram was performed and dictated separately.  IMPRESSION: Ultrasound-guided biopsy of left breast mass. No apparent complications.   Electronically Signed   By: Beth  Brown M.D.   On: 12/09/2013 15:43       IMPRESSION:   No history exists.    The patient has a recent diagnosis of invasive ductal carcinoma of the left breast. She has completed surgery and appears to be doing satisfactorily postoperatively. Final pathology is pending.  I discussed with the patient the role of adjuvant radiation treatment in this setting. We discussed the potential benefit of radiation treatment, especially with regards to local control of the patient's tumor. We also discussed the possible side effects and risks of such a treatment as well.   All of the patient's questions were answered. The patient wishes to  proceed with radiation treatment at the appropriate time.  PLAN: The patient sees medical oncology in the near future and I will follow-up on this visit to see what decisions are made. The patient's pathology is still pending, not yet available to ensure that the patient does not need any further surgery.   I spent 60 minutes face to face with the patient and more than 50% of that time was spent in counseling and/or coordination of care.    ________________________________   John S. Moody, MD, PhD   **Disclaimer: This note was dictated with voice recognition software. Similar sounding words can inadvertently be transcribed and   this note may contain transcription errors which may not have been corrected upon publication of note.**  

## 2014-01-10 ENCOUNTER — Encounter: Payer: Self-pay | Admitting: Hematology

## 2014-01-10 ENCOUNTER — Ambulatory Visit (HOSPITAL_BASED_OUTPATIENT_CLINIC_OR_DEPARTMENT_OTHER): Payer: No Typology Code available for payment source | Admitting: Hematology

## 2014-01-10 ENCOUNTER — Ambulatory Visit: Payer: No Typology Code available for payment source

## 2014-01-10 ENCOUNTER — Telehealth: Payer: Self-pay | Admitting: Hematology

## 2014-01-10 VITALS — BP 145/69 | HR 103 | Temp 98.4°F | Resp 18 | Ht 64.0 in | Wt 241.9 lb

## 2014-01-10 DIAGNOSIS — C50912 Malignant neoplasm of unspecified site of left female breast: Secondary | ICD-10-CM

## 2014-01-10 DIAGNOSIS — Z170 Estrogen receptor positive status [ER+]: Secondary | ICD-10-CM

## 2014-01-10 DIAGNOSIS — C50212 Malignant neoplasm of upper-inner quadrant of left female breast: Secondary | ICD-10-CM

## 2014-01-10 DIAGNOSIS — E119 Type 2 diabetes mellitus without complications: Secondary | ICD-10-CM

## 2014-01-10 DIAGNOSIS — I10 Essential (primary) hypertension: Secondary | ICD-10-CM

## 2014-01-10 NOTE — Progress Notes (Signed)
[place] CONSULT NOTE  Patient Care Team: Osborne Oman, MD as PCP - General (Obstetrics and Gynecology) Minerva Ends, MD as Consulting Physician (Family Medicine) Erroll Luna, MD as Consulting Physician (General Surgery) Truitt Merle, MD as Consulting Physician (Hematology) Marye Round, MD as Consulting Physician (Radiation Oncology)  CHIEF COMPLAINTS/PURPOSE OF CONSULTATION:  Breast cancer  HISTORY OF PRESENTING ILLNESS:  Rachel Herman 63 y.o. female is here because of newly diagnsed stage I left breast cancer.  This was found by Initial mammogram 11/11/2013 whichshowed a possible left breast mass, right breast (-), diagnostic MM and US showed a hypoechoic mass at the left breast 10 o'clock 18 cm from nipple measuring 1.82 x 0.71 x 0.88 cm. Ultrasound of the left axilla is negative. Biopsy 0n 12/09/2013 showed IDA, ER+/PR+/HER2-. She underwent left lumpectomy by Dr. Brantley Stage on 11/25/.2015, with close inferior surgical margin.  She still has mild pain at surgical site, no need pain meds. She caught a cold 3 days ago, no fever, (+) cough and rounning nose. No sick contact. She did not have flu shot this year. She is not very physically active at baseline, try to lose weight lately and did lost about 20 pounds since her GYN surgery in August 2015. She has moderate fatigue, which is chronic and stable. She has no other complaints.    MEDICAL HISTORY:  Past Medical History  Diagnosis Date  . Obesity, morbid, BMI 40.0-49.9   . Hypertension 2014  . Hyperlipidemia   . PONV (postoperative nausea and vomiting)   . Breast cancer 12/09/13    left breast Invasive Ductal Carcinoma  . Diabetes mellitus 09/16/13    Diagnosed on 09/16/13; HgA1C was 7.2    SURGICAL HISTORY: Past Surgical History  Procedure Laterality Date  . Abdominal hysterectomy N/A 09/20/2013    Procedure: HYSTERECTOMY ABDOMINAL;  Surgeon: Osborne Oman, MD;  Location: [facility];  Service:  Gynecology;  Laterality: N/A;  . Salpingoophorectomy  09/20/2013    Procedure: SALPINGO OOPHORECTOMY;  Surgeon: Osborne Oman, MD;  Location: [facility];  Service: Gynecology;;  . Laparoscopic assisted vaginal hysterectomy  09/20/2013    Procedure: LAPAROSCOPIC ASSISTED VAGINAL HYSTERECTOMY;  Surgeon: Osborne Oman, MD;  Location: [facility];  Service: Gynecology;;  PT WAS EXAMINED WHILE UP IN [place] AND IT WAS DECIDED TO OPEN PT DUE TO LARGE MASS  . Breast lumpectomy with axillary lymph node biopsy Left 12/29/13    left breast   . Radioactive seed guided mastectomy with axillary sentinel lymph node biopsy Left 12/29/2013    Procedure: LEFT BREAST RADIOACTIVE SEED LOCALIZED LUMPECTOMY WITH SENTINEL LYMPH NODE MAPPING;  Surgeon: Erroll Luna, MD;  Location: [facility];  Service: General;  Laterality: Left;    SOCIAL HISTORY: History   Social History  . Marital Status: Married    Spouse Name: Natallie Ravenscroft     Number of Children: 3  . Years of Education: 12   Occupational History  .  Unemployed   Social History Main Topics  . Smoking status: Never Smoker   . Smokeless tobacco: Never Used  . Alcohol Use: No  . Drug Use: No  . Sexual Activity: Not Currently    Birth Control/ Protection: Post-menopausal   Other Topics Concern  . Not on file   Social History Narrative   Married to [employer] in 1986.    Has 3 children from previous marriage, 1 in [place], 1 in [place], 1 in prison ([place]).  Lives with husband.           FAMILY HISTORY: Family History  Problem Relation Age of Onset  . Diabetes Mother   . Diabetes Sister   . Diabetes Brother   . Diabetes Son   . Diabetes Maternal Aunt   . Cancer Paternal Grandmother 49    stomach  . Breast cancer Sister     ALLERGIES:  has No Known Allergies.  MEDICATIONS:  Current Outpatient Prescriptions  Medication Sig Dispense Refill  . ACCU-CHEK FASTCLIX LANCETS MISC 1 Units by Percutaneous  route 4 (four) times daily. 100 each 12  . acetaminophen (TYLENOL) 500 MG tablet Take 500 mg by mouth every 6 (six) hours as needed.    Marland Kitchen aspirin 325 MG tablet Take 325 mg by mouth once.    Marland Kitchen atorvastatin (LIPITOR) 40 MG tablet Take 1 tablet (40 mg total) by mouth daily. 90 tablet 3  . Blood Glucose Monitoring Suppl (ACCU-CHEK NANO SMARTVIEW) W/DEVICE KIT 1 kit by Subdermal route as directed. Check blood sugars for fasting, and two hours after breakfast, lunch and dinner (4 checks daily) 1 kit 0  . Docusate Sodium (DSS) 100 MG CAPS Take 100 mg by mouth 2 (two) times daily. 30 each 3  . glucose blood (ACCU-CHEK SMARTVIEW) test strip Use as instructed to check blood sugars 100 each 12  . lisinopril-hydrochlorothiazide (PRINZIDE,ZESTORETIC) 20-25 MG per tablet Take 1 tablet by mouth daily. 90 tablet 3  . magnesium hydroxide (MILK OF MAGNESIA) 400 MG/5ML suspension Take 15 mLs by mouth 3 (three) times daily. (Patient taking differently: Take 15 mLs by mouth daily as needed. ) 360 mL 0  . metFORMIN (GLUCOPHAGE) 500 MG tablet Take 1 tablet (500 mg total) by mouth 2 (two) times daily with a meal. 60 tablet 2  . Multiple Vitamins-Minerals (MULTIVITAMIN WITH MINERALS) tablet Take 1 tablet by mouth daily.     No current facility-administered medications for this visit.    REVIEW OF SYSTEMS:   Constitutional: Denies fevers, chills or abnormal night sweats, (+) 20 lbs weight loss since 8/15, low appetite  Eyes: (+) blurriness of vision, no double vision or watery eyes Ears, nose, mouth, throat, and face: Denies mucositis or sore throat Respiratory: (+) dyspnea on exertion, Denies cough or wheezes Cardiovascular: Denies palpitation, chest discomfort or lower extremity swelling Gastrointestinal:  Denies nausea, heartburn or change in bowel habits Skin: Denies abnormal skin rashes Lymphatics: Denies new lymphadenopathy or easy bruising Neurological:Denies numbness, tingling or new  weaknesses Behavioral/Psych: Mood is stable, no new changes  All other systems were reviewed with the patient and are negative.  PHYSICAL EXAMINATION: ECOG PERFORMANCE STATUS: 1 - Symptomatic but completely ambulatory  Filed Vitals:   01/10/14 1053  BP: 145/69  Pulse: 103  Temp: 98.4 F (36.9 C)  Resp: 18   Filed Weights   01/10/14 1053  Weight: 241 lb 14.4 oz (109.725 kg)    GENERAL:alert, no distress and comfortable SKIN: skin color, texture, turgor are normal, no rashes or significant lesions EYES: normal, conjunctiva are pink and non-injected, sclera clear OROPHARYNX:no exudate, no erythema and lips, buccal mucosa, and tongue normal  NECK: supple, thyroid normal size, non-tender, without nodularity LYMPH:  no palpable lymphadenopathy in the cervical, axillary or inguinal LUNGS: clear to auscultation and percussion with normal breathing effort HEART: regular rate & rhythm and no murmurs and no lower extremity edema ABDOMEN:abdomen soft, non-tender and normal bowel sounds Musculoskeletal:no cyanosis of digits and no clubbing  PSYCH: alert & oriented x  3 with fluent speech NEURO: no focal motor/sensory deficits Breasts: Breast inspection showed them to be symmetrical with no nipple discharge. Surgical scar at the left upper breast and axillary area, healing well without surrounding erythema or discharge. The area underneath the surgical scar is firm and full. Palpation of the right breasts and axilla revealed no obvious mass that I could appreciate.   LABORATORY DATA:  I have reviewed the data as listed Lab Results  Component Value Date   WBC 9.6 12/27/2013   HGB 10.8* 12/29/2013   HCT 41.9 12/27/2013   MCV 87.1 12/27/2013   PLT 319 12/27/2013    Recent Labs  09/16/13 1000 09/20/13 0830 09/21/13 0531 12/27/13 1305  NA 138 135* 137 141  K 3.6* 3.5* 3.9 3.7  CL 98 92* 94* 100  CO2 23 29 33* 27  GLUCOSE 220* 154* 145* 112*  BUN _0 CREATININE 0.86 0.86  0.78 0.80  CALCIUM 9.5 9.2 7.8* 9.9  GFRNONAA 70* 70* 87* 77*  GFRAA 82* 82* >90 89*  PROT 7.9  --  5.8* 8.3  ALBUMIN 3.8  --  2.5* 3.9  AST 44*  --  19 65*  ALT 60*  --  21 58*  ALKPHOS 59  --  52 67  BILITOT 0.4  --  0.6 0.4   PATHOLOGY REPORT Diagnosis 12/29/2013 1. Breast, lumpectomy, left - INVASIVE GRADE II DUCTAL CARCINOMA, SPANNING 1.7 CM IN GREATEST DIMENSION. - SECOND FOCUS OF INVASIVE GRADE I DUCTAL CARCINOMA, SPANNING 0.5 CM IN GREATEST DIMENSION. - INTERMEDIATE GRADE DUCTAL CARCINOMA IN SITU ASSOCIATED WITH BOTH FOCI OF TUMOR. - INVASIVE DUCTAL CARCINOMA IS EXTREMELY CLOSE (LESS THAN 0.1 CM) TO INFERIOR MARGIN. - DUCTAL CARCINOMA IN SITU IS CLOSE (0.1 CM) TO INFERIOR MARGIN. - OTHER MARGINS NEGATIVE. - SEE ONCOLOGY TEMPLATE. 2. Lymph node, sentinel, biopsy, Left axillary 1 of 4 FINAL for DORLISA, SAVINO ([id number]) Diagnosis(continued) - ONE BENIGN LYMPH NODE WITH NO TUMOR SEEN (0/1). 3. Lymph node, sentinel, biopsy, Left axilla - ONE BENIGN LYMPH NODE WITH NO TUMOR SEEN (0/1). Microscopic Comment 1. BREAST, INVASIVE TUMOR, WITH LYMPH NODES PRESENT Specimen, including laterality and lymph node sampling (sentinel, non-sentinel): Left partial breast with left sentinel lymph node sampling. Procedure: Left breast lumpectomy with sentinel lymph node biopsies. Histologic type: Invasive ductal carcinoma. Larger focus: Grade: II. Tubule formation: 2. Nuclear pleomorphism: 2. Mitotic: 3. Smaller focus (near inferior margin): Grade: I. Tubule formation: 1. Nuclear pleomorphism: 2. Mitotic: 1. Tumor sizes (gross and glass slide measurement): 1.7 cm and 0.5 cm. Margins: Invasive, distance to closest margin: less than 0.1 cm (inferior margin). In-situ, distance to closest margin: 0.1 cm (inferior margin). Lymphovascular invasion: Definitive lymphovascular invasion is not identified. Ductal carcinoma in situ: Yes. Grade: Intermediate grade. Extensive  intraductal component: There is an extensive intraductal component of ductal carcinoma in situ on the smaller focus but not on the larger focus. Lobular neoplasia: No. Tumor focality: Two foci, multifocal. Treatment effect: N/A. Extent of tumor: Tumor confined to breast parenchyma. Skin: Not received. Nipple: Not received. Skeletal muscle: Not received. Lymph nodes: Examined: 2 Sentinel. 0 Non-sentinel. 2 Total. Lymph nodes with metastasis: 0. Breast prognostic profile: Performed on previous case ([id number]). Estrogen receptor: 100%, positive. Progesterone receptor: 96%, positive. Her 2 neu by CISH: 0.81 ratio, no amplification. Ki-67: 73%. Non-neoplastic breast: Fat necrosis. TNM: pT1c(m), pN0, MX. Comments: A smooth muscle myosin, calponin, and p63 immunohistochemical stain are performed on a single block, which helps to confirm the presence  of a second focus of invasive ductal carcinoma associated 2 of 4 FINAL for Rachel Herman, Rachel Herman ([id number]) Microscopic Comment(continued) with ductal carcinoma in situ. As Her-2 neu by CISH was negative on the initial biopsy, this will be repeated on the larger tumor focus and reported in an addendum to follow. A breast prognostic profile will not be performed on the smaller tumor focus unless otherwise requested (the tumor foci although different grades show morphologically similar nuclear features). (RH:ds 12/31/13)   RADIOGRAPHIC STUDIES: I have personally reviewed the radiological images as listed and agreed with the findings in the report.   MM digital Diagnostic and [place] 11/23/13 Ultrasound is performed, showing angulated hypoechoic mass at the left breast 10 o'clock 18 cm from nipple measuring 1.82 x 0.71 x 0.88 cm. Ultrasound of the left axilla is negative.   ASSESSMENT & PLAN:  63 year old Caucasian female, with past medical history of hypertension, diabetes, dyslipidemia, obesity who was found to have a left breast mass  by screening mammogram.  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 Grade 2, Ki-67 73%, status post lumpectomy with close margin. -She is scheduled to see Dr. Brantley Stage this Friday, I think she likely needs re-excision due to the very close surgical margin.  -She likely needs adjuvant radiation after re-excision if she does not choose to have mastectomy. -Due to the strong positivity of ER and PR, I recommend adjuvant hormonal therapy to reduce the risk of cancer recurrence in the future, which includes local and distant recurrence. Given her post menopause status, I recommend aromatase inhibitor, such as anastrozol, for total 5 years. -We discussed the role of adjuvant chemotherapy. I recommend to have a Oncotype test to predict her cancer recurrence risks for her ER positive and node negative early stage cancer. Although she has some medical comorbidities, she is still fit enough for adjuvant chemotherapy if her Oncotype recurrence score is high.   #2 hypertension, diabetes, dyslipidemia -She will continue follow-up with her primary care physician.  #3 Morbid obesity -We discussed healthy diet and regular exercise. She is willing to try. -I suggest you take calcium and vitamin D for bone health. Before her start adjuvant Arimidex, I will obtain a bone density scan as a baseline.  Follow-up: return in 3-4 weeks to discuss her Oncotype test result and finalize her adjuvant therapy plan.  All questions were answered. The patient knows to call the clinic with any problems, questions or concerns. I spent 40 minutes counseling the patient face to face. The total time spent in the appointment was 60 minutes and more than 50% was on counseling.     Truitt Merle, MD 01/10/2014 11:57 AM

## 2014-01-10 NOTE — Telephone Encounter (Signed)
gv and printed appt sched and avs for pt for Jan 2016 °

## 2014-01-10 NOTE — Progress Notes (Signed)
Checked in new patient with no issues prior to seeing the dr. She has appt card and has not been traveling.  °

## 2014-01-11 ENCOUNTER — Encounter: Payer: Self-pay | Admitting: *Deleted

## 2014-01-11 ENCOUNTER — Other Ambulatory Visit: Payer: Self-pay | Admitting: Obstetrics & Gynecology

## 2014-01-11 NOTE — Progress Notes (Signed)
Received order for oncotype dx testing from Dr. Burr Medico. Requisition sent to pathology.

## 2014-01-14 ENCOUNTER — Ambulatory Visit (INDEPENDENT_AMBULATORY_CARE_PROVIDER_SITE_OTHER): Payer: Self-pay | Admitting: Surgery

## 2014-01-14 NOTE — H&P (Signed)
Rachel Herman 01/14/2014 8:44 AM Location: [facility] Patient #: [id number] DOB: 1950/12/07 Married / Language: English / Race: White Female History of Present Illness Rachel Herman A. Tuwanna Krausz Herman; 01/14/2014 9:12 AM) Patient words: breast f/u  pt 2 weeks out from left breast lumpectomy and SLN mapping for left breast cancer. Final path reviewed and she has a close margin with DCIS inferior. Doing well.             ADDITIONAL INFORMATION: 1. CHROMOGENIC IN-SITU HYBRIDIZATION Results: HER-2/NEU BY CISH - NO AMPLIFICATION OF HER-2 DETECTED. RESULT RATIO OF HER2: CEP 17 SIGNALS 1.03 AVERAGE HER2 COPY NUMBER PER CELL 1.90 REFERENCE RANGE NEGATIVE HER2/Chr17 Ratio <2.0 and Average HER2 copy number <4.0 EQUIVOCAL HER2/Chr17 Ratio <2.0 and Average HER2 copy number 4.0 and <6.0 POSITIVE HER2/Chr17 Ratio >=2.0 and/or Average HER2 copy number >=6.0 Rachel Herman Pathologist, Electronic Signature ( Signed 01/06/2014) FINAL DIAGNOSIS Diagnosis 1. Breast, lumpectomy, left - INVASIVE GRADE II DUCTAL CARCINOMA, SPANNING 1.7 CM IN GREATEST DIMENSION. - SECOND FOCUS OF INVASIVE GRADE I DUCTAL CARCINOMA, SPANNING 0.5 CM IN GREATEST DIMENSION. - INTERMEDIATE GRADE DUCTAL CARCINOMA IN SITU ASSOCIATED WITH BOTH FOCI OF TUMOR. - INVASIVE DUCTAL CARCINOMA IS EXTREMELY CLOSE (LESS THAN 0.1 CM) TO INFERIOR MARGIN. - DUCTAL CARCINOMA IN SITU IS CLOSE (0.1 CM) TO INFERIOR MARGIN. - OTHER MARGINS NEGATIVE. - SEE ONCOLOGY TEMPLATE. 2. Lymph node, sentinel, biopsy, Left axillary 1 of 4 FINAL for Herman, Rachel ([id number]) Diagnosis(continued) - ONE BENIGN LYMPH NODE WITH NO TUMOR SEEN (0/1). 3. Lymph node, sentinel, biopsy, Left axilla - ONE BENIGN LYMPH NODE WITH NO TUMOR SEEN (0/1). Microscopic Comment 1. BREAST, INVASIVE TUMOR, WITH LYMPH NODES PRESENT Specimen, including laterality and lymph node sampling (sentinel, non-sentinel): Left partial breast with  left sentinel lymph node sampling. Procedure: Left breast lumpectomy with sentinel lymph node biopsies. Histologic type: Invasive ductal carcinoma. Larger focus: Grade: II. Tubule formation: 2. Nuclear pleomorphism: 2. Mitotic: 3. Smaller focus (near inferior margin): Grade: I. Tubule formation: 1. Nuclear pleomorphism: 2. Mitotic: 1. Tumor sizes (gross and glass slide measurement): 1.7 cm and 0.5 cm. Margins: Invasive, distance to closest margin: less than 0.1 cm (inferior margin). In-situ, distance to closest margin: 0.1 cm (inferior margin). Lymphovascular invasion: Definitive lymphovascular invasion is not identified. Ductal carcinoma in situ: Yes. Grade: Intermediate grade. Extensive intraductal component: There is an extensive intraductal component of ductal carcinoma in situ on the smaller focus but not on the larger focus. Lobular neoplasia: No. Tumor focality: Two foci, multifocal. Treatment effect: N/A. Extent of tumor: Tumor confined to breast parenchyma. Skin: Not received. Nipple: Not received. Skeletal muscle: Not received. Lymph nodes: Examined: 2 Sentinel. 0 Non-sentinel. 2 Total. Lymph nodes with metastasis: 0. Breast prognostic profile: Performed on previous case ([id number]). Estrogen receptor: 100%, positive. Progesterone receptor: 96%, positive. Her 2 neu by CISH: 0.81 ratio, no amplification. Ki-67: 73%. Non-neoplastic breast: Fat necrosis. TNM: pT1c(m), pN0, MX. Comments: A smooth muscle myosin, calponin, and p63 immunohistochemical stain are performed on a single block, which helps to confirm the presence of a second focus of invasive ductal carcinoma associated 2 of 4 FINAL for Rachel, Herman ([id number]) Microscopic Comment(.  The patient is a 63 year old female   Allergies Marjean Donna, [place]; 01/14/2014 8:45 AM) No Known Drug Allergies 12/13/2013  Medication History (Sonya Bynum, CMA; 01/14/2014 8:45  AM) Lisinopril-Hydrochlorothiazide (20-25MG Tablet, Oral) Active. MetFORMIN HCl (500MG Tablet, Oral) Active. Atorvastatin Calcium (40MG Tablet, Oral) Active. Ibuprofen (800MG Tablet, Oral) Active. Multi Vitamin Daily (  Oral) Active. Aspirin EC (325MG Tablet DR, Oral) Active.    Vitals (Sonya Bynum CMA; 01/14/2014 8:44 AM) 01/14/2014 8:44 AM Weight: 242 lb Height: 64in Body Surface Area: 2.23 m Body Mass Index: 41.54 kg/m Temp.: 68F(Temporal)  Pulse: 79 (Regular)  BP: 132/90 (Sitting, Left Arm, Standard)     Physical Exam (Elanie Hammitt A. Kimmy Parish Herman; 01/14/2014 9:13 AM)  Chest and Lung Exam Chest and lung exam reveals -quiet, even and easy respiratory effort with no use of accessory muscles and on auscultation, normal breath sounds, no adventitious sounds and normal vocal resonance. Inspection Chest Wall - Normal. Back - normal.  Breast Note: left breast incision CLEAN DRY INTACT axilla incision C/D/I no seroma or signs of infection.   Cardiovascular Cardiovascular examination reveals -normal heart sounds, regular rate and rhythm with no murmurs and normal pedal pulses bilaterally.    Assessment & Plan (Beth[place]hem Langstaff A. Santo Zahradnik Herman; 01/14/2014 9:10 AM)  S/P LUMPECTOMY, LEFT BREAST (V45.89  Z98.89) Impression: stage 1 left breast cancer with close inferior margin and DCIS Dr Burr Medico and I agree that reexcision of inferior margin given close DICS necessary. Explained to the patient and she agrees to proceed. Risk of lumpectomy include bleeding, infection, seroma, more surgery, use of seed/wire, wound care, cosmetic deformity and the need for other treatments, death , blood clots, death. Pt agrees to proceed.  Current Plans Pt Education - instructions: discussed with patient and provided information.

## 2014-01-17 ENCOUNTER — Encounter: Payer: Self-pay | Admitting: Family Medicine

## 2014-01-17 ENCOUNTER — Ambulatory Visit: Payer: Medicaid Other | Attending: Family Medicine | Admitting: Family Medicine

## 2014-01-17 VITALS — BP 137/82 | HR 100 | Temp 98.0°F | Resp 16 | Ht 64.0 in | Wt 243.0 lb

## 2014-01-17 DIAGNOSIS — M5410 Radiculopathy, site unspecified: Secondary | ICD-10-CM | POA: Insufficient documentation

## 2014-01-17 DIAGNOSIS — J208 Acute bronchitis due to other specified organisms: Secondary | ICD-10-CM | POA: Diagnosis not present

## 2014-01-17 DIAGNOSIS — M545 Low back pain, unspecified: Secondary | ICD-10-CM

## 2014-01-17 DIAGNOSIS — E119 Type 2 diabetes mellitus without complications: Secondary | ICD-10-CM

## 2014-01-17 DIAGNOSIS — J209 Acute bronchitis, unspecified: Secondary | ICD-10-CM | POA: Insufficient documentation

## 2014-01-17 DIAGNOSIS — M79605 Pain in left leg: Secondary | ICD-10-CM

## 2014-01-17 LAB — POCT GLYCOSYLATED HEMOGLOBIN (HGB A1C): Hemoglobin A1C: 6.6

## 2014-01-17 LAB — GLUCOSE, POCT (MANUAL RESULT ENTRY): POC Glucose: 96 mg/dl (ref 70–99)

## 2014-01-17 MED ORDER — METFORMIN HCL 500 MG PO TABS: 500.0000 mg | ORAL_TABLET | Freq: Two times a day (BID) | ORAL | Status: DC

## 2014-01-17 MED ORDER — GABAPENTIN 300 MG PO CAPS: 300.0000 mg | ORAL_CAPSULE | Freq: Every day | ORAL | Status: DC

## 2014-01-17 MED ORDER — GUAIFENESIN-CODEINE 100-10 MG/5ML PO SOLN: 5.0000 mL | Freq: Four times a day (QID) | ORAL | Status: DC | PRN

## 2014-01-17 MED ORDER — KETOROLAC TROMETHAMINE 60 MG/2ML IM SOLN
60.0000 mg | Freq: Once | INTRAMUSCULAR | Status: AC
  Administered 2014-01-17: 60 mg via INTRAMUSCULAR

## 2014-01-17 MED ORDER — GUAIFENESIN ER 600 MG PO TB12: 600.0000 mg | ORAL_TABLET | Freq: Two times a day (BID) | ORAL | Status: DC | PRN

## 2014-01-17 NOTE — Assessment & Plan Note (Signed)
A: well controlled P: Continue prinzide 20-25 daily and metformin 500 mg BID Foot exam done today Urine ACR obtained today  F/u in 6 months

## 2014-01-17 NOTE — Patient Instructions (Signed)
Rachel Herman,   1. Diabetes: Excellent control. Continue metformin.   2. HTN: well controlled continue prinzide.  3. Bronchitis:  Drink plenty of fluid.  Use mucinex to break up cold. Use tussionex for cough.   4. Back pain: gabapentin 300 mg nightly as needed.  F/u in 3-4 weeks to full evaluated back pain. Since you are so well controlled, diabetes f/u in 6 months.   Yearly supply of medications ordered.   Dr. Adrian Blackwater

## 2014-01-17 NOTE — Assessment & Plan Note (Signed)
A:  Lumbar back pain with radiculopathy  P: gabapentin 300 mg nightly as needed. F/u in 3-4 weeks to fully evaluat back pain.

## 2014-01-17 NOTE — Assessment & Plan Note (Addendum)
A: viral Bronchitis: improving P: Drink plenty of fluid.  Use mucinex to break up cold. Use guaifenesin with codeine for cough.

## 2014-01-17 NOTE — Progress Notes (Signed)
   Subjective:    Patient ID: Rachel Herman, female    DOB: [birth year], 64 y.o.   MRN: [id number] CC; DM2 f/u  HPI  63 yo F 1. CHRONIC DIABETES  Disease Monitoring  Blood Sugar Ranges: 80-140  Polyuria: no   Visual problems: no   Medication Compliance: yes  Medication Side Effects  Hypoglycemia: no   Preventitive Health Care  Eye Exam: due   Foot Exam: done today   Diet pattern: small meals   Exercise: rare   2. Cold symptoms: x 5 days days. Wheezing, SOB, coughing intermittently productive, nasal drainage. No fever for past two days. T max was 102 F.  Feels about the same, unchanged except no fever.   3. L low back pain: x 2 months. Persistent. Pain when standing or walking for a long time. Radiates down to rectum and L leg.   Soc Hx: never smoked  Review of Systems As per HPI     Objective:   Physical Exam BP 137/82 mmHg  Pulse 100  Temp(Src) 98 F (36.7 C) (Oral)  Resp 16  Ht 5\' 4"  (1.626 m)  Wt 243 lb (110.224 kg)  BMI 41.69 kg/m2  SpO2 97%  BP Readings from Last 3 Encounters:  01/17/14 137/82  01/10/14 145/69  12/29/13 145/89  General appearance: alert, cooperative and no distress  Eyes: conjunctivae/corneas clear. PERRL, EOM's intact.  Ears: normal TM's and external ear canals both ears Nose: Nares normal. Septum midline. Mucosa normal. No drainage or sinus tenderness. Throat: lips, mucosa, and tongue normal; teeth and gums normal Lungs: normal WOB, no rales, slight diffuse crackles  Heart: regular rate and rhythm, S1, S2 normal, no murmur, click, rub or gallop Extremities: extremities normal, atraumatic, no cyanosis or edema  Lab Results  Component Value Date   HGBA1C 6.6 01/17/2014        Assessment & Plan:

## 2014-01-18 LAB — MICROALBUMIN / CREATININE URINE RATIO
Creatinine, Urine: 121.5 mg/dL
Microalb Creat Ratio: 4.9 mg/g (ref 0.0–30.0)
Microalb, Ur: 0.6 mg/dL (ref <2.0)

## 2014-01-25 ENCOUNTER — Telehealth: Payer: Self-pay | Admitting: *Deleted

## 2014-01-25 NOTE — Telephone Encounter (Signed)
-----   Message from Minerva Ends, MD sent at 01/18/2014  2:32 PM EST ----- Normal urine studies

## 2014-01-25 NOTE — Telephone Encounter (Signed)
Pt aware of lab results 

## 2014-02-03 ENCOUNTER — Encounter (HOSPITAL_COMMUNITY): Payer: Self-pay

## 2014-02-07 ENCOUNTER — Ambulatory Visit (HOSPITAL_BASED_OUTPATIENT_CLINIC_OR_DEPARTMENT_OTHER): Payer: No Typology Code available for payment source | Admitting: Hematology

## 2014-02-07 ENCOUNTER — Other Ambulatory Visit (HOSPITAL_BASED_OUTPATIENT_CLINIC_OR_DEPARTMENT_OTHER): Payer: No Typology Code available for payment source

## 2014-02-07 ENCOUNTER — Encounter: Payer: Self-pay | Admitting: *Deleted

## 2014-02-07 ENCOUNTER — Telehealth: Payer: Self-pay | Admitting: Hematology

## 2014-02-07 ENCOUNTER — Encounter: Payer: Self-pay | Admitting: Hematology

## 2014-02-07 VITALS — BP 152/86 | HR 86 | Temp 97.9°F | Resp 18 | Ht 64.0 in | Wt 243.1 lb

## 2014-02-07 DIAGNOSIS — C50912 Malignant neoplasm of unspecified site of left female breast: Secondary | ICD-10-CM

## 2014-02-07 DIAGNOSIS — Z170 Estrogen receptor positive status [ER+]: Secondary | ICD-10-CM

## 2014-02-07 DIAGNOSIS — E119 Type 2 diabetes mellitus without complications: Secondary | ICD-10-CM

## 2014-02-07 DIAGNOSIS — I10 Essential (primary) hypertension: Secondary | ICD-10-CM

## 2014-02-07 DIAGNOSIS — C50212 Malignant neoplasm of upper-inner quadrant of left female breast: Secondary | ICD-10-CM

## 2014-02-07 LAB — CBC WITH DIFFERENTIAL/PLATELET
BASO%: 1.1 % (ref 0.0–2.0)
Basophils Absolute: 0.1 10*3/uL (ref 0.0–0.1)
EOS%: 2.9 % (ref 0.0–7.0)
Eosinophils Absolute: 0.3 10*3/uL (ref 0.0–0.5)
HCT: 40.4 % (ref 34.8–46.6)
HGB: 12.7 g/dL (ref 11.6–15.9)
LYMPH%: 32.4 % (ref 14.0–49.7)
MCH: 26.8 pg (ref 25.1–34.0)
MCHC: 31.4 g/dL — ABNORMAL LOW (ref 31.5–36.0)
MCV: 85.4 fL (ref 79.5–101.0)
MONO#: 0.6 10*3/uL (ref 0.1–0.9)
MONO%: 6.1 % (ref 0.0–14.0)
NEUT#: 5.7 10*3/uL (ref 1.5–6.5)
NEUT%: 57.5 % (ref 38.4–76.8)
Platelets: 331 10*3/uL (ref 145–400)
RBC: 4.73 10*6/uL (ref 3.70–5.45)
RDW: 14.3 % (ref 11.2–14.5)
WBC: 9.9 10*3/uL (ref 3.9–10.3)
lymph#: 3.2 10*3/uL (ref 0.9–3.3)

## 2014-02-07 LAB — COMPREHENSIVE METABOLIC PANEL (CC13)
ALT: 49 U/L (ref 0–55)
AST: 43 U/L — ABNORMAL HIGH (ref 5–34)
Albumin: 3.7 g/dL (ref 3.5–5.0)
Alkaline Phosphatase: 66 U/L (ref 40–150)
Anion Gap: 10 mEq/L (ref 3–11)
BUN: 15.3 mg/dL (ref 7.0–26.0)
CO2: 30 mEq/L — ABNORMAL HIGH (ref 22–29)
Calcium: 9.8 mg/dL (ref 8.4–10.4)
Chloride: 103 mEq/L (ref 98–109)
Creatinine: 0.9 mg/dL (ref 0.6–1.1)
EGFR: 71 mL/min/{1.73_m2} — ABNORMAL LOW (ref >90)
Glucose: 133 mg/dl (ref 70–140)
Potassium: 4.1 mEq/L (ref 3.5–5.1)
Sodium: 143 mEq/L (ref 136–145)
Total Bilirubin: 0.59 mg/dL (ref 0.20–1.20)
Total Protein: 7.5 g/dL (ref 6.4–8.3)

## 2014-02-07 NOTE — Progress Notes (Signed)
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 Ultrasound of the left axilla is negative.      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    12/29/2013 Surgery Left lumpectomy with close (<0.1cm) inferior margin    INTERIM HISTORY: She returns for follow up. Her surgical site pain has resolved. She has some low back pain since two month ago, and it got worse lately, and the pain radiates to left leg. She tried heat pad, and tylenol but did not improve much. She was evaluate by her PCP and received a injection but did not help. She will follow up.   She is scheduled to see Dr. Brantley Stage tomorrow and discuss re-excision for close margin.    MEDICAL HISTORY:  Past Medical History  Diagnosis Date  . Obesity, morbid, BMI 40.0-49.9   . Hypertension 2014  . Hyperlipidemia   . PONV (postoperative nausea and vomiting)   . Breast cancer 12/09/13    left breast Invasive Ductal Carcinoma  . Diabetes mellitus 09/16/13    Diagnosed on 09/16/13; HgA1C was 7.2    SURGICAL HISTORY: Past Surgical History  Procedure Laterality Date  . Abdominal hysterectomy  N/A 09/20/2013    Procedure: HYSTERECTOMY ABDOMINAL;  Surgeon: Osborne Oman, MD;  Location: [facility];  Service: Gynecology;  Laterality: N/A;  . Salpingoophorectomy  09/20/2013    Procedure: SALPINGO OOPHORECTOMY;  Surgeon: Osborne Oman, MD;  Location: [facility];  Service: Gynecology;;  . Laparoscopic assisted vaginal hysterectomy  09/20/2013    Procedure: LAPAROSCOPIC ASSISTED VAGINAL HYSTERECTOMY;  Surgeon: Osborne Oman, MD;  Location: [facility];  Service: Gynecology;;  PT WAS EXAMINED WHILE UP IN [place] AND IT WAS DECIDED TO OPEN PT DUE TO LARGE MASS  . Breast lumpectomy with axillary lymph node biopsy Left 12/29/13    left breast   . Radioactive seed guided mastectomy with axillary sentinel lymph node biopsy Left 12/29/2013    Procedure: LEFT BREAST RADIOACTIVE SEED LOCALIZED LUMPECTOMY WITH SENTINEL LYMPH NODE MAPPING;  Surgeon: Erroll Luna, MD;  Location: [facility];  Service: General;  Laterality: Left;    SOCIAL HISTORY: History   Social History  . Marital Status: Married    Spouse Name: Alexandrya Chim     Number of Children: 3  . Years of Education: 12   Occupational History  .  Unemployed   Social History Main Topics  . Smoking status: Never Smoker   . Smokeless tobacco: Never Used  . Alcohol Use: No  . Drug Use: No  . Sexual Activity: Not Currently    Birth Control/ Protection: Post-menopausal   Other Topics Concern  . Not on file   Social  History Narrative   Married to [employer] in 1986.    Has 3 children from previous marriage, 1 in [place], 1 in [place], 1 in prison ([place]).    Lives with husband.           FAMILY HISTORY: Family History  Problem Relation Age of Onset  . Diabetes Mother   . Cancer Mother 18    multiple myeloma   . Diabetes Sister   . Diabetes Brother   . Cancer Brother 40    prostate cancer   . Diabetes Son   . Diabetes Maternal Aunt   . Cancer Paternal Grandmother 69    stomach  . Breast  cancer Sister     ALLERGIES:  has No Known Allergies.  MEDICATIONS:  Current Outpatient Prescriptions  Medication Sig Dispense Refill  . ACCU-CHEK FASTCLIX LANCETS MISC 1 Units by Percutaneous route 4 (four) times daily. 100 each 12  . acetaminophen (TYLENOL) 500 MG tablet Take 500 mg by mouth every 6 (six) hours as needed.    . Ascorbic Acid (VITAMIN C) 1000 MG tablet Take 1,000 mg by mouth daily.    Marland Kitchen aspirin 325 MG tablet Take 325 mg by mouth once.    Marland Kitchen atorvastatin (LIPITOR) 40 MG tablet Take 1 tablet (40 mg total) by mouth daily. 90 tablet 3  . Blood Glucose Monitoring Suppl (ACCU-CHEK NANO SMARTVIEW) W/DEVICE KIT 1 kit by Subdermal route as directed. Check blood sugars for fasting, and two hours after breakfast, lunch and dinner (4 checks daily) 1 kit 0  . Docusate Sodium (DSS) 100 MG CAPS Take 100 mg by mouth 2 (two) times daily. 30 each 3  . gabapentin (NEURONTIN) 300 MG capsule Take 1 capsule (300 mg total) by mouth at bedtime. 30 capsule 1  . glucose blood (ACCU-CHEK SMARTVIEW) test strip Use as instructed to check blood sugars 100 each 12  . guaiFENesin (MUCINEX) 600 MG 12 hr tablet Take 1 tablet (600 mg total) by mouth 2 (two) times daily as needed for cough or to loosen phlegm. 30 tablet 1  . guaiFENesin-codeine 100-10 MG/5ML syrup Take 5 mLs by mouth every 6 (six) hours as needed for cough. 120 mL 0  . lisinopril-hydrochlorothiazide (PRINZIDE,ZESTORETIC) 20-25 MG per tablet Take 1 tablet by mouth daily. 90 tablet 3  . magnesium hydroxide (MILK OF MAGNESIA) 400 MG/5ML suspension Take 15 mLs by mouth 3 (three) times daily. (Patient taking differently: Take 15 mLs by mouth daily as needed. ) 360 mL 0  . metFORMIN (GLUCOPHAGE) 500 MG tablet Take 1 tablet (500 mg total) by mouth 2 (two) times daily with a meal. 180 tablet 3  . Multiple Vitamins-Minerals (MULTIVITAMIN WITH MINERALS) tablet Take 1 tablet by mouth daily.    . vitamin D, CHOLECALCIFEROL, 400 UNITS tablet Take 400 Units by  mouth daily.     No current facility-administered medications for this visit.    REVIEW OF SYSTEMS:   Constitutional: Denies fevers, chills or abnormal night sweats, (+) 20 lbs weight loss since 8/15, low appetite  Eyes: (+) blurriness of vision, no double vision or watery eyes Ears, nose, mouth, throat, and face: Denies mucositis or sore throat Respiratory: (+) dyspnea on exertion, Denies cough or wheezes Cardiovascular: Denies palpitation, chest discomfort or lower extremity swelling Gastrointestinal:  Denies nausea, heartburn or change in bowel habits Skin: Denies abnormal skin rashes Lymphatics: Denies new lymphadenopathy or easy bruising Neurological:Denies numbness, tingling or new weaknesses Behavioral/Psych: Mood is stable, no new changes  All other systems  were reviewed with the patient and are negative.  PHYSICAL EXAMINATION: ECOG PERFORMANCE STATUS: 1 - Symptomatic but completely ambulatory  Filed Vitals:   02/07/14 1016  BP: 152/86  Pulse: 86  Temp: 97.9 F (36.6 C)  Resp: 18   Filed Weights   02/07/14 1016  Weight: 243 lb 1.6 oz (110.269 kg)    GENERAL:alert, no distress and comfortable SKIN: skin color, texture, turgor are normal, no rashes or significant lesions EYES: normal, conjunctiva are pink and non-injected, sclera clear OROPHARYNX:no exudate, no erythema and lips, buccal mucosa, and tongue normal  NECK: supple, thyroid normal size, non-tender, without nodularity LYMPH:  no palpable lymphadenopathy in the cervical, axillary or inguinal LUNGS: clear to auscultation and percussion with normal breathing effort HEART: regular rate & rhythm and no murmurs and no lower extremity edema ABDOMEN:abdomen soft, non-tender and normal bowel sounds Musculoskeletal:no cyanosis of digits and no clubbing  PSYCH: alert & oriented x 3 with fluent speech NEURO: no focal motor/sensory deficits Breasts: Breast inspection showed them to be symmetrical with no nipple  discharge. Surgical scar at the left upper breast and axillary area, healing well without surrounding erythema or discharge. The area underneath the surgical scar is firm and full. Palpation of the right breasts and axilla revealed no obvious mass that I could appreciate.   LABORATORY DATA:  I have reviewed the data as listed Lab Results  Component Value Date   WBC 9.9 02/07/2014   HGB 12.7 02/07/2014   HCT 40.4 02/07/2014   MCV 85.4 02/07/2014   PLT 331 02/07/2014    Recent Labs  09/16/13 1000 09/20/13 0830 09/21/13 0531 12/27/13 1305  NA 138 135* 137 141  K 3.6* 3.5* 3.9 3.7  CL 98 92* 94* 100  CO2 23 29 33* 27  GLUCOSE 220* 154* 145* 112*  BUN _0 CREATININE 0.86 0.86 0.78 0.80  CALCIUM 9.5 9.2 7.8* 9.9  GFRNONAA 70* 70* 87* 77*  GFRAA 82* 82* >90 89*  PROT 7.9  --  5.8* 8.3  ALBUMIN 3.8  --  2.5* 3.9  AST 44*  --  19 65*  ALT 60*  --  21 58*  ALKPHOS 59  --  52 67  BILITOT 0.4  --  0.6 0.4   PATHOLOGY REPORT Diagnosis 12/29/2013 1. Breast, lumpectomy, left - INVASIVE GRADE II DUCTAL CARCINOMA, SPANNING 1.7 CM IN GREATEST DIMENSION. - SECOND FOCUS OF INVASIVE GRADE I DUCTAL CARCINOMA, SPANNING 0.5 CM IN GREATEST DIMENSION. - INTERMEDIATE GRADE DUCTAL CARCINOMA IN SITU ASSOCIATED WITH BOTH FOCI OF TUMOR. - INVASIVE DUCTAL CARCINOMA IS EXTREMELY CLOSE (LESS THAN 0.1 CM) TO INFERIOR MARGIN. - DUCTAL CARCINOMA IN SITU IS CLOSE (0.1 CM) TO INFERIOR MARGIN. - OTHER MARGINS NEGATIVE. - SEE ONCOLOGY TEMPLATE. 2. Lymph node, sentinel, biopsy, Left axillary 1 of 4 FINAL for Rachel Herman, Rachel Herman ([id number]) Diagnosis(continued) - ONE BENIGN LYMPH NODE WITH NO TUMOR SEEN (0/1). 3. Lymph node, sentinel, biopsy, Left axilla - ONE BENIGN LYMPH NODE WITH NO TUMOR SEEN (0/1). Microscopic Comment 1. BREAST, INVASIVE TUMOR, WITH LYMPH NODES PRESENT Specimen, including laterality and lymph node sampling (sentinel, non-sentinel): Left partial breast with  left sentinel lymph node sampling. Procedure: Left breast lumpectomy with sentinel lymph node biopsies. Histologic type: Invasive ductal carcinoma. Larger focus: Grade: II. Tubule formation: 2. Nuclear pleomorphism: 2. Mitotic: 3. Smaller focus (near inferior margin): Grade: I. Tubule formation: 1. Nuclear pleomorphism: 2. Mitotic: 1. Tumor sizes (gross and glass slide measurement): 1.7 cm and 0.5 cm.  Margins: Invasive, distance to closest margin: less than 0.1 cm (inferior margin). In-situ, distance to closest margin: 0.1 cm (inferior margin). Lymphovascular invasion: Definitive lymphovascular invasion is not identified. Ductal carcinoma in situ: Yes. Grade: Intermediate grade. Extensive intraductal component: There is an extensive intraductal component of ductal carcinoma in situ on the smaller focus but not on the larger focus. Lobular neoplasia: No. Tumor focality: Two foci, multifocal. Treatment effect: N/A. Extent of tumor: Tumor confined to breast parenchyma. Skin: Not received. Nipple: Not received. Skeletal muscle: Not received. Lymph nodes: Examined: 2 Sentinel. 0 Non-sentinel. 2 Total. Lymph nodes with metastasis: 0. Breast prognostic profile: Performed on previous case ([id number]). Estrogen receptor: 100%, positive. Progesterone receptor: 96%, positive. Her 2 neu by CISH: 0.81 ratio, no amplification. Ki-67: 73%. Non-neoplastic breast: Fat necrosis. TNM: pT1c(m), pN0, MX. Comments: A smooth muscle myosin, calponin, and p63 immunohistochemical stain are performed on a single block, which helps to confirm the presence of a second focus of invasive ductal carcinoma associated 2 of 4 FINAL for Rachel Herman, Rachel Herman ([id number]) Microscopic Comment(continued) with ductal carcinoma in situ. As Her-2 neu by CISH was negative on the initial biopsy, this will be repeated on the larger tumor focus and reported in an addendum to follow. A breast prognostic profile  will not be performed on the smaller tumor focus unless otherwise requested (the tumor foci although different grades show morphologically similar nuclear features). (RH:ds 12/31/13)  Oncotype DX Score: 23 (Intermediate risk) (ER+/PR+/HER2-)  RADIOGRAPHIC STUDIES: I have personally reviewed the radiological images as listed and agreed with the findings in the report.   MM digital Diagnostic and [place] 11/23/13 Ultrasound is performed, showing angulated hypoechoic mass at the left breast 10 o'clock 18 cm from nipple measuring 1.82 x 0.71 x 0.88 cm. Ultrasound of the left axilla is negative.   ASSESSMENT & PLAN:  64 year old Caucasian female, with past medical history of hypertension, diabetes, dyslipidemia, obesity who was found to have a left breast mass by screening mammogram.  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 Grade 2, Ki-67 73%, status post lumpectomy with close margin. -She is scheduled to see Dr. Brantley Stage this week, I think she likely needs re-excision due to the very close surgical margin.  -She likely needs adjuvant radiation after re-excision if she does not choose to have mastectomy. -Due to the strong positivity of ER and PR, I recommend adjuvant hormonal therapy to reduce the risk of cancer recurrence in the future, which includes local and distant recurrence. Given her post menopause status, I recommend aromatase inhibitor, such as anastrozol, for total 5 years. -I discussed the Oncotype DX result with her and her husband. The recurrence score is 23, with the predicted 10 year recurrence risk of 15% with tamoxifen alone. The benefit of chemotherapy is uncertain in this intermediate risk group. I do not strongly recommend adjuvant chemotherapy. Patient agree with not having chemotherapy.   #2 hypertension, diabetes, dyslipidemia -She will continue follow-up with her primary care physician.  #3 Morbid  obesity -We discussed healthy diet and regular exercise. She is willing to try. -I suggest you take calcium and vitamin D for bone health. Before her start adjuvant Arimidex, I will obtain a bone density scan as a baseline.  PLAN: -No adjuvant chemo -RTC in 8 week when you are finishing radiation, to start anastrozole  All questions were answered. The patient knows to call the clinic with any problems, questions or concerns. I spent 20 minutes  counseling the patient face to face. The total time spent in the appointment was 25 minutes and more than 50% was on counseling.     Truitt Merle, MD 02/07/2014 10:39 AM

## 2014-02-07 NOTE — Progress Notes (Signed)
Received Oncotype Dx results of 23.  Placed a copy in Dr. Geralyn Flash box and took a copy to HIM to scan.

## 2014-02-07 NOTE — Telephone Encounter (Signed)
gv and printed appt sched and avs forpt for March 2016 °

## 2014-02-08 ENCOUNTER — Ambulatory Visit (INDEPENDENT_AMBULATORY_CARE_PROVIDER_SITE_OTHER): Payer: Self-pay | Admitting: Surgery

## 2014-02-08 NOTE — H&P (Signed)
Rachel Herman 02/08/2014 3:10 PM Location: [facility] Patient #: [id number] DOB: 02-09-1950 Married / Language: English / Race: White Female History of Present Illness [name] A. Neisha Hinger MD; 02/08/2014 3:34 PM) Patient words: post Op breast   POST OF FROM LUMPECTOMY CLOSE MARGINS AND NEED REEXCISION. HAS SEEN [facility]. DOING WELL.    1. Breast, lumpectomy, left - INVASIVE GRADE II DUCTAL CARCINOMA, SPANNING 1.7 CM IN GREATEST DIMENSION. - SECOND FOCUS OF INVASIVE GRADE I DUCTAL CARCINOMA, SPANNING 0.5 CM IN GREATEST DIMENSION. - INTERMEDIATE GRADE DUCTAL CARCINOMA IN SITU ASSOCIATED WITH BOTH FOCI OF TUMOR. - INVASIVE DUCTAL CARCINOMA IS EXTREMELY CLOSE (LESS THAN 0.1 CM) TO INFERIOR MARGIN. - DUCTAL CARCINOMA IN SITU IS CLOSE (0.1 CM) TO INFERIOR MARGIN. - OTHER MARGINS NEGATIVE. - SEE ONCOLOGY TEMPLATE. 2. Lymph node, sentinel, biopsy, Left axillary 1 of 4 FINAL for Rachel Herman, Rachel Herman ([id number]) Diagnosis(continued) - ONE BENIGN LYMPH NODE WITH NO TUMOR SEEN (0/1). 3. Lymph node, sentinel, biopsy, Left axilla - ONE BENIGN LYMPH NODE WITH NO TUMOR SEEN (0/1). Microscopic Comment 1. BREAST, INVASIVE TUMOR, WITH LYMPH NODES PRESENT Specimen, including laterality and lymph node sampling (sentinel, non-sentinel): Left partial breast with left sentinel lymph node sampling. Procedure: Left breast lumpectomy with sentinel lymph node biopsies. Histologic type: Invasive ductal carcinoma. Larger focus: Grade: II. Tubule formation: 2. Nuclear pleomorphism: 2. Mitotic: 3. Smaller focus (near inferior margin): Grade: I. Tubule formation: 1. Nuclear pleomorphism: 2. Mitotic: 1. Tumor sizes (gross and glass slide measurement): 1.7 cm and 0.5 cm. Margins: Invasive, distance to closest margin: less than 0.1 cm (inferior margin). In-situ, distance to closest margin: 0.1 cm (inferior margin). Lymphovascular invasion: Definitive lymphovascular  invasion is not identified. Ductal carcinoma in situ: Yes. Grade: Intermediate grade. Extensive intraductal component: There is an extensive intraductal component of ductal carcinoma in situ on the smaller focus but not on the larger focus. Lobular neoplasia: No. Tumor focality: Two foci, multifocal. Treatment effect: N/A. Extent of tumor: Tumor confined to breast parenchyma. Skin: Not received. Nipple: Not received. Skeletal muscle: Not received. Lymph nodes: Examined: 2 Sentinel. 0 Non-sentinel. 2 Total. Lymph nodes with metastasis: 0. Breast prognostic profile: Performed on previous case ([id number]). Estrogen receptor: 100%, positive. Progesterone receptor: 96%, positive. Her 2 neu by CISH: 0.81 ratio, no amplification. Ki-67: 73%. Non-neoplastic breast: Fat necrosis. TNM: pT1c(m), pN0, MX. Comments: A smooth muscle myosin, calponin, and p63 immunohistochemical stain are performed on a single block, which helps to confirm the presence of a second focus of invasive ductal carcinoma associated 2 of 4 FINAL for Rachel Herman, Rachel Herman ([id number]) Microscopic Comment(continued) with ductal carcinoma in situ. As Her-2 neu by CISH was negative on the initial biopsy, this will be repeated on the larger tumor focus and reported in an addendum to follow. A breast prognostic profile will not be performed on the smaller tumor focus unless otherwise requested (the tumor foci although different grades show morphologically similar nuclear features). (RH:ds 12/31/13) Willeen Niece MD Pathologist, Electronic Signature (Case signed 01/03/2014) Specimen Gross and Clinical Information Specimen(s) Obtained: 1. Breast, lumpectomy, left 2. Lymph node, sentinel, biopsy, Left axillary 3. Lymph node, sentinel, biopsy, Left axilla Specimen Clinical.  The patient is a 64 year old female   Vitals Briant Cedar CMA; 02/08/2014 3:11 PM) 02/08/2014 3:10 PM Weight: 241 lb Height: 64in Body  Surface Area: 2.22 m Body Mass Index: 41.37 kg/m Temp.: 40F  Pulse: 96 (Regular)  BP: 140/80 (Sitting, Left Arm, Standard)     Physical Exam (Adorian Gwynne A.  Quince Santana MD; 02/08/2014 3:35 PM)  Breast Note: INCISIONS CDI soft non tender left breast     Assessment & Plan (Rayel Santizo A. Kaye Luoma MD; 02/08/2014 3:35 PM)  BREAST CANCER, LEFT (174.9  C50.912) Impression: Risk of lumpectomy include bleeding, infection, seroma, more surgery, use of seed/wire, wound care, cosmetic deformity and the need for other treatments, death , blood clots, death. Pt agrees to proceed. Risk of sentinel lymph node mapping include bleeding, infection, lymphedema, shoulder pain. stiffness, dye allergy. cosmetic deformity , blood clots, death, need for more surgery. Pt agres to proceed.  PT OPTED FOR BREAST CONSERVATION OVER MASTECTOMY/ RECONSTRUCTION. LEFT BREAST SEED LOCALIZED LUMPECTOMY AND SLN MAPPING.  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 Explained to the patient and she agrees to proceed. Risk of lumpectomy include bleeding, infection, seroma, more surgery, use of seed/wire, wound care, cosmetic deformity and the need for other treatments, death , blood clots, death. Pt agrees to proceed.

## 2014-02-15 ENCOUNTER — Telehealth: Payer: Self-pay | Admitting: Family Medicine

## 2014-02-15 ENCOUNTER — Ambulatory Visit: Payer: Medicaid Other | Attending: Family Medicine | Admitting: Family Medicine

## 2014-02-15 ENCOUNTER — Encounter: Payer: Self-pay | Admitting: Family Medicine

## 2014-02-15 VITALS — BP 132/87 | HR 91 | Temp 97.5°F | Resp 14 | Ht 64.0 in | Wt 243.0 lb

## 2014-02-15 DIAGNOSIS — I10 Essential (primary) hypertension: Secondary | ICD-10-CM | POA: Diagnosis present

## 2014-02-15 DIAGNOSIS — M545 Low back pain, unspecified: Secondary | ICD-10-CM

## 2014-02-15 DIAGNOSIS — M549 Dorsalgia, unspecified: Secondary | ICD-10-CM | POA: Diagnosis not present

## 2014-02-15 DIAGNOSIS — I152 Hypertension secondary to endocrine disorders: Secondary | ICD-10-CM

## 2014-02-15 DIAGNOSIS — M79605 Pain in left leg: Secondary | ICD-10-CM

## 2014-02-15 DIAGNOSIS — E119 Type 2 diabetes mellitus without complications: Secondary | ICD-10-CM | POA: Diagnosis not present

## 2014-02-15 LAB — GLUCOSE, POCT (MANUAL RESULT ENTRY): POC Glucose: 121 mg/dl — AB (ref 70–99)

## 2014-02-15 MED ORDER — GABAPENTIN 300 MG PO CAPS: 600.0000 mg | ORAL_CAPSULE | Freq: Every day | ORAL | Status: DC

## 2014-02-15 MED ORDER — TRAMADOL HCL 50 MG PO TABS: 25.0000 mg | ORAL_TABLET | Freq: Three times a day (TID) | ORAL | Status: DC | PRN

## 2014-02-15 NOTE — Progress Notes (Signed)
Pt is here following up on her HTN, diabetes and her acute pain in her lower back, left hip and left leg.

## 2014-02-15 NOTE — Assessment & Plan Note (Addendum)
3. Low back and thigh pain: sciatica and statin mylagia suspected P: Tramadol as needed-pain medicine Increase gabapentin to 600 mg nightly STOP atorvastatin (lipitor) for next 2 weeks. This is a potent statin and could be causing the pain in your thighs Imaging: lumbar and hip x-rays to rule out lytic lesions given breast cancer history  F/u in 4 weeks

## 2014-02-15 NOTE — Assessment & Plan Note (Signed)
Diabetes: well controlled blood sugars. Continue metformin.

## 2014-02-15 NOTE — Telephone Encounter (Signed)
Called patient, left VM. Ordered x-ray of low back and hips. She is to go to [facility] at her earliest convenience.

## 2014-02-15 NOTE — Patient Instructions (Signed)
Rachel Herman,  Thank you for coming in today.  1. HTN: well controlled blood pressure. Continue prinzide  2. Diabetes: well controlled blood sugars. Continue metformin.  3. Low back and thigh pain: Tramadol as needed-pain medicine Increase gabapentin to 600 mg nightly STOP atorvastatin (lipitor) for next 2 weeks. This is a potent statin and could be causing the pain in your thighs  F/u in 4 weeks  Dr. Adrian Blackwater

## 2014-02-15 NOTE — Assessment & Plan Note (Signed)
Resolved

## 2014-02-15 NOTE — Progress Notes (Signed)
   Subjective:    Patient ID: Rachel Herman, female    DOB: Apr 17, 1950, 64 y.o.   MRN: [id number] CC: f/u HTN and DM2, acute low back pain with pain in L hip and L leg  HPI  1. CHRONIC HYPERTENSION  Disease Monitoring  Blood pressure range: does not check   Chest pain: no   Dyspnea: no   Claudication: no   Medication compliance: yes  Medication Side Effects  Lightheadedness: no   Urinary frequency: no   Edema: no   2. CHRONIC DIABETES  Disease Monitoring  Blood Sugar Ranges: 75-150  Polyuria: no   Visual problems: no   Medication Compliance: yes  Medication Side Effects  Hypoglycemia: no   3. BACK PAIN  Location: low back Quality: sharp and achy Onset: chronic  Worse with: lying and prolonged sitting  Better with: movement Radiation: to L gluteal and down L leg  Trauma: no  Red Flags Fecal/urinary incontinence: no  Numbness/Weakness: no  Fever/chills/sweats: no  Night pain: yes  Unexplained weight loss: no   h/o cancer/immunosuppression: yes  IV drug use: no  PMH of osteoporosis or chronic steroid use: no    Soc Hx: chronic non smoker  Review of SystemsL4 As per HPI     Objective:   Physical Exam BP 132/87 mmHg  Pulse 91  Temp(Src) 97.5 F (36.4 C) (Oral)  Resp 14  Ht 5\' 4"  (1.626 m)  Wt 243 lb (110.224 kg)  BMI 41.69 kg/m2  SpO2 93% General appearance: alert, cooperative and no distress Lungs: clear to auscultation bilaterally Heart: regular rate and rhythm, S1, S2 normal, no murmur, click, rub or gallop  Back Exam: Back: Normal Curvature, no deformities or CVA tenderness  Paraspinal Tenderness: L4-L5  LE Strength 5/5  LE Sensation: in tact  LE Reflexes 2+ and symmetric  Straight leg raise: negaive Tenderness along L quads   Lab Results  Component Value Date   HGBA1C 6.6 01/17/2014        Assessment & Plan:

## 2014-02-15 NOTE — Assessment & Plan Note (Signed)
HTN: well controlled blood pressure. Continue prinzide

## 2014-02-16 ENCOUNTER — Ambulatory Visit: Payer: Medicaid Other | Attending: Family Medicine

## 2014-02-21 ENCOUNTER — Ambulatory Visit (HOSPITAL_COMMUNITY)
Admission: RE | Admit: 2014-02-21 | Discharge: 2014-02-21 | Disposition: A | Discharge status: Home / Self Care | Payer: Medicaid Other | Source: Ambulatory Visit | Attending: Family Medicine | Admitting: Family Medicine

## 2014-02-21 ENCOUNTER — Telehealth: Payer: Self-pay | Admitting: *Deleted

## 2014-02-21 DIAGNOSIS — M79605 Pain in left leg: Secondary | ICD-10-CM

## 2014-02-21 DIAGNOSIS — M545 Low back pain, unspecified: Secondary | ICD-10-CM

## 2014-02-21 DIAGNOSIS — M4726 Other spondylosis with radiculopathy, lumbar region: Secondary | ICD-10-CM

## 2014-02-21 DIAGNOSIS — M160 Bilateral primary osteoarthritis of hip: Secondary | ICD-10-CM | POA: Diagnosis not present

## 2014-02-21 DIAGNOSIS — M549 Dorsalgia, unspecified: Secondary | ICD-10-CM | POA: Insufficient documentation

## 2014-02-21 NOTE — Telephone Encounter (Signed)
Pt aware of resutls

## 2014-02-21 NOTE — Telephone Encounter (Signed)
-----   Message from Minerva Ends, MD sent at 02/21/2014  3:02 PM EST ----- X-ray confirm lumbar (low back) degenerative joint disease. Normal alignment.

## 2014-02-21 NOTE — Telephone Encounter (Signed)
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

## 2014-02-21 NOTE — Telephone Encounter (Signed)
Pt aware of Xray results 

## 2014-02-22 ENCOUNTER — Telehealth: Payer: Self-pay | Admitting: *Deleted

## 2014-02-22 DIAGNOSIS — E119 Type 2 diabetes mellitus without complications: Secondary | ICD-10-CM

## 2014-02-22 DIAGNOSIS — M47816 Spondylosis without myelopathy or radiculopathy, lumbar region: Secondary | ICD-10-CM | POA: Insufficient documentation

## 2014-02-22 NOTE — Telephone Encounter (Signed)
Pt aware of information.  

## 2014-02-22 NOTE — Telephone Encounter (Signed)
Please inform patient that treatment includes tylenol scheduled a safe dose is 650 mg three times a day on top of this some patient's also require antiinflammatory like aleve. Also other medications like the gabapentin she is on for nerve pain and tramadol for generalized pain.  Weight loss to reduce stress on joints (low impact exercises and reduced carb/reduced calorie diet). Physical therapy to help with fall prevention.   If pain becomes unbearable with conservative therapy a neurosurgeon can help with managing low back pain and discuss if surgery is an option. An MRI is often ordered by the neurosurgeon or done just prior to seeing a neurosurgeon for the purposes of planning if a more invasive approach is desired.   For now, I recommend a focus on weight loss and physical therapy.

## 2014-02-22 NOTE — Telephone Encounter (Signed)
Left voice message to return call 

## 2014-02-22 NOTE — Addendum Note (Signed)
Addended by: Boykin Nearing on: 02/22/2014 02:51 PM   Modules accepted: Orders

## 2014-02-24 ENCOUNTER — Encounter (HOSPITAL_BASED_OUTPATIENT_CLINIC_OR_DEPARTMENT_OTHER): Payer: Self-pay | Admitting: *Deleted

## 2014-02-24 NOTE — Progress Notes (Signed)
No new labs needed 

## 2014-02-28 ENCOUNTER — Ambulatory Visit: Payer: Medicaid Other | Attending: Family Medicine | Admitting: Physical Therapy

## 2014-02-28 DIAGNOSIS — M5442 Lumbago with sciatica, left side: Secondary | ICD-10-CM | POA: Diagnosis not present

## 2014-02-28 MED ORDER — METFORMIN HCL 500 MG PO TABS: 500.0000 mg | ORAL_TABLET | Freq: Every day | ORAL | Status: DC

## 2014-02-28 NOTE — Patient Instructions (Signed)
Sleeping on Back  Place pillow under knees. A pillow with cervical support and a roll around waist are also helpful. Copyright  VHI. All rights reserved.  Sleeping on Side Place pillow between knees. Use cervical support under neck and a roll around waist as needed. Copyright  VHI. All rights reserved.   Sleeping on Stomach   If this is the only desirable sleeping position, place pillow under lower legs, and under stomach or chest as needed.  Posture - Sitting   Sit upright, head facing forward. Try using a roll to support lower back. Keep shoulders relaxed, and avoid rounded back. Keep hips level with knees. Avoid crossing legs for long periods. Stand to Sit / Sit to Stand   To sit: Bend knees to lower self onto front edge of chair, then scoot back on seat. To stand: Reverse sequence by placing one foot forward, and scoot to front of seat. Use rocking motion to stand up.   Work Height and Reach  Ideal work height is no more than 2 to 4 inches below elbow level when standing, and at elbow level when sitting. Reaching should be limited to arm's length, with elbows slightly bent.  Bending  Bend at hips and knees, not back. Keep feet shoulder-width apart.    Posture - Standing   Good posture is important. Avoid slouching and forward head thrust. Maintain curve in low back and align ears over shoul- ders, hips over ankles.  Alternating Positions   Alternate tasks and change positions frequently to reduce fatigue and muscle tension. Take rest breaks. Computer Work   Position work to face forward. Use proper work and seat height. Keep shoulders back and down, wrists straight, and elbows at right angles. Use chair that provides full back support. Add footrest and lumbar roll as needed.  Getting Into / Out of Car  Lower self onto seat, scoot back, then bring in one leg at a time. Reverse sequence to get out.  Dressing  Lie on back to pull socks or slacks over feet, or sit  and bend leg while keeping back straight.    Housework - Sink  Place one foot on ledge of cabinet under sink when standing at sink for prolonged periods.   Pushing / Pulling  Pushing is preferable to pulling. Keep back in proper alignment, and use leg muscles to do the work.  Deep Squat   Squat and lift with both arms held against upper trunk. Tighten stomach muscles without holding breath. Use smooth movements to avoid jerking.  Avoid Twisting   Avoid twisting or bending back. Pivot around using foot movements, and bend at knees if needed when reaching for articles.  Carrying Luggage   Distribute weight evenly on both sides. Use a cart whenever possible. Do not twist trunk. Move body as a unit.   Lifting Principles .Maintain proper posture and head alignment. .Slide object as close as possible before lifting. .Move obstacles out of the way. .Test before lifting; ask for help if too heavy. .Tighten stomach muscles without holding breath. .Use smooth movements; do not jerk. .Use legs to do the work, and pivot with feet. .Distribute the work load symmetrically and close to the center of trunk. .Push instead of pull whenever possible.   Ask For Help   Ask for help and delegate to others when possible. Coordinate your movements when lifting together, and maintain the low back curve.  Log Roll   Lying on back, bend left knee and place left   arm across chest. Roll all in one movement to the right. Reverse to roll to the left. Always move as one unit. Housework - Sweeping  Use long-handled equipment to avoid stooping.   Housework - Wiping  Position yourself as close as possible to reach work surface. Avoid straining your back.  Laundry - Unloading Wash   To unload small items at bottom of washer, lift leg opposite to arm being used to reach.  [place] close to area to be raked. Use arm movements to do the work. Keep back straight and avoid  twisting.     Cart  When reaching into cart with one arm, lift opposite leg to keep back straight.   Getting Into / Out of Bed  Lower self to lie down on one side by raising legs and lowering head at the same time. Use arms to assist moving without twisting. Bend both knees to roll onto back if desired. To sit up, start from lying on side, and use same move-ments in reverse. Housework - Vacuuming  Hold the vacuum with arm held at side. Step back and forth to move it, keeping head up. Avoid twisting.   Laundry - IT consultant so that bending and twisting can be avoided.   Laundry - Unloading Dryer  Squat down to reach into clothes dryer or use a reacher.  Gardening - Weeding / Probation officer or Kneel. Knee pads may be helpful.                  Reducing Load   Copyright  VHI. All rights reserved.  BODY MECHANICS Tips Good body mechanics are important during activities of daily living. The practice of good body mechanics will: -help distribute weight throughout the skeleton in a more anatomically correct manner thus stimulating more normal forces on the bones, and encouraging stronger, healthier, denser bones. -reduce unnatural forces on bones, ligaments, joints and muscles and reduce risk of fracture, other injury or back pain. A WORD ON BODY POSITIONING: Sitting is the hardest position for the back. Lying on the back is the easiest. Standing, in good body alignment, is somewhere between. A good motto is: Sit less, stand more, and, when you can't do that, lie down on your back and exercise to strengthen it.  Copyright  VHI. All rights reserved.       Supine to Sit (Active)   Lie on back, left leg bent. Roll to other side. From side-lying, sit up on side of bed. Complete ___ sets of ___ repetitions. Perform ___ sessions per day.  Copyright  VHI. All rights reserved.    Housework - Reaching Down   If you are unable to bend  your knees or squat, use a lazy Rachel Herman to keep items within easy reach. Store only light, unbreakable items on the lowest shelves, and use a reacher to pick them up.  Copyright  VHI. All rights reserved.  Low Shelf   Squat down, and bring item close to lift.   Copyright  VHI. All rights reserved.  Lifting Principles .Maintain proper posture and head alignment. .Slide object as close as possible before lifting. .Move obstacles out of the way. .Test before lifting; ask for help if too heavy. .Tighten stomach muscles without holding breath. .Use smooth movements; do not jerk. .Use legs to do the work, and pivot with feet. .Distribute the work load symmetrically and close to the center of trunk. .Push instead of pull whenever  possible.  Copyright  VHI. All rights reserved.  Posture - Standing   Good posture is important. Avoid slouching and forward head thrust. Maintain curve in low back and align ears over shoul- ders, hips over ankles.   Copyright  VHI. All rights reserved.   Posture - Sitting   Sit upright, head facing forward. Try using a roll to support lower back. Keep shoulders relaxed, and avoid rounded back. Keep hips level with knees. Avoid crossing legs for long periods.   Copyright  VHI. All rights reserved.  Ideal Posture Use with figures on 3 (2 of 2): 1.Head erect 2.Chin in 3.Chest and navel aligned 4.Spinal curves maintained 5.Knees relaxed 6.Shoulders and hips aligned 7.Feet slightly apart 8.Toes and arches active 9.Abdomen taut (breathe with diaphragm) 10.Arms at sides Ideal posture is: -pain free. -achieved with practice, mindful interest, and body awareness.  Copyright  VHI. All rights reserved.     Move heavy items one at a time, or move portions of the contents.   Posture Awareness     Stand and check posture: Jut chin, pull back to comfortable position. Tilt pelvis forward, back; be sure back is not swayed. Roll from heels to balls of  feet, then distribute your weight evenly. Picture a line through spine pulling you erect. Focus on breathing. Good Posture = Better Breathing. Check ____ times per day.  http://gt2.exer.us/873   Copyright  VHI. All rights reserved.  Bridge   Lie back, legs bent. Inhale, pressing hips up. Keeping ribs in, lengthen lower back. Exhale, rolling down along spine from top. Repeat ___10_ times. Do _2___ sessions per day.  Copyright  VHI. All rights reserved.   Pelvic Tilt   Flatten back by tightening stomach muscles and buttocks. Repeat __10__ times per set. Do _1-2___ sets per session. Do ___2_ sessions per day.  http://orth.exer.us/134   Copyright  VHI. All rights reserved. Knee to Chest (Flexion)   Pull knee toward chest. Feel stretch in lower back or buttock area. Breathing deeply, Hold __30__ seconds. Repeat with other knee. Repeat __2-3__ times. Do __2__ sessions per day.  http://gt2.exer.us/225   Copyright  VHI. All rights reserved.   Lower Trunk Rotation Stretch   Keeping back flat and feet together, rotate knees to left side. Hold __10-15__ seconds. Repeat __10__ times per set. Do _1  sets per session. Do __2__ sessions per day.  http://orth.exer.us/122   Copyright  VHI. All rights reserved.     Put palms against wall, one leg forward and bent. With other leg back straight and heel flat on floor, lean into wall. Hold ____ seconds. Change legs and repeat. Repeat ____ times. Do ____ sessions per day.  http://gt2.exer.us/419   Copyright  VHI. All rights reserved.

## 2014-02-28 NOTE — Telephone Encounter (Signed)
I recommend increasing metformin to 1000 mg BID if she can tolerate it. Metformin has been proven to result in weight loss for some diabetic patients.   Please let pt know that this is first option, if she agrees please change the dosing on her medication list.

## 2014-02-28 NOTE — Addendum Note (Signed)
Addended by: Boykin Nearing on: 02/28/2014 01:48 PM   Modules accepted: Orders

## 2014-02-28 NOTE — Telephone Encounter (Signed)
CBG of 25 is very dangerous and scary. Given that information she should not increase metformin. In fact she should decrease metformin to 500 mg once daily after breakfast with a plan to stop it completely if CBGs < 70 still occur.  I will do some research into some other possible weight loss therapies. Right now, a healthy diet and being as active as possible with weight bearing exercise is recommended.

## 2014-02-28 NOTE — Therapy (Signed)
[facility] [place], [place], [address] Phone: [phone]   Fax:  [phone]  Physical Therapy Evaluation  Patient Details  Name: Rachel Herman MRN: [id number] Date of Birth: 14-May-1950 Referring Provider:  Minerva Ends, MD  Encounter Date: 02/28/2014      PT End of Session - 02/28/14 1440    Visit Number 1   Number of Visits 1   PT Start Time 1345   PT Stop Time 1430   PT Time Calculation (min) 45 min   Activity Tolerance Patient tolerated treatment well      Past Medical History  Diagnosis Date  . Obesity, morbid, BMI 40.0-49.9   . Hypertension 2014  . Hyperlipidemia   . PONV (postoperative nausea and vomiting)   . Breast cancer 12/09/13    left breast Invasive Ductal Carcinoma  . Diabetes mellitus 09/16/13    Diagnosed on 09/16/13; HgA1C was 7.2    Past Surgical History  Procedure Laterality Date  . Abdominal hysterectomy N/A 09/20/2013    Procedure: HYSTERECTOMY ABDOMINAL;  Surgeon: Osborne Oman, MD;  Location: [facility];  Service: Gynecology;  Laterality: N/A;  . Salpingoophorectomy  09/20/2013    Procedure: SALPINGO OOPHORECTOMY;  Surgeon: Osborne Oman, MD;  Location: [facility];  Service: Gynecology;;  . Laparoscopic assisted vaginal hysterectomy  09/20/2013    Procedure: LAPAROSCOPIC ASSISTED VAGINAL HYSTERECTOMY;  Surgeon: Osborne Oman, MD;  Location: [facility];  Service: Gynecology;;  PT WAS EXAMINED WHILE UP IN [place] AND IT WAS DECIDED TO OPEN PT DUE TO LARGE MASS  . Breast lumpectomy with axillary lymph node biopsy Left 12/29/13    left breast   . Radioactive seed guided mastectomy with axillary sentinel lymph node biopsy Left 12/29/2013    Procedure: LEFT BREAST RADIOACTIVE SEED LOCALIZED LUMPECTOMY WITH SENTINEL LYMPH NODE MAPPING;  Surgeon: Erroll Luna, MD;  Location: [facility];  Service: General;  Laterality: Left;    There were no vitals taken for this  visit.  Visit Diagnosis:  Bilateral low back pain with left-sided sciatica      Subjective Assessment - 02/28/14 1352    Symptoms Pain in low back began in mid Nov. after ovarian tumor surgery.  She has pain, occ weakness in LLE.  She is currently being treated for CA.    Limitations Standing   How long can you sit comfortably? 45 min    How long can you stand comfortably? 30 min    How long can you walk comfortably? 10 min    Diagnostic tests XR-OA   Currently in Pain? Yes   Pain Score 7    Pain Location Back   Pain Orientation Left;Lower  stops at knee   Pain Descriptors / Indicators Aching   Pain Type Chronic pain   Pain Onset More than a month ago   Pain Frequency Constant   Aggravating Factors  standing, walking, stting too long   Pain Relieving Factors pain rub, meds, sit to rest   Effect of Pain on Daily Activities has to take frequent rest breaks   Multiple Pain Sites No          [facility] PT Assessment - 02/28/14 1359    Assessment   Medical Diagnosis lumbar OA with radiculopathy   Onset Date 12/22/13   Next MD Visit Feb.    Prior Therapy No   Precautions   Precautions Other (comment)   Precaution Comments CA   Restrictions   Weight  Bearing Restrictions No   Balance Screen   Has the patient fallen in the past 6 months No   Sensation   Light Touch Appears Intact   AROM   Lumbar Flexion 25%   Lumbar Extension 75%   Lumbar - Right Side Bend WNL   Lumbar - Left Side Bend WNL pain   Lumbar - Right Rotation WNL min pain   Lumbar - Left Rotation WNL min pain   Strength   Right Hip ABduction 4/5   Left Hip ABduction 3+/5     MMT: all else WNL except for Lt. Knee flexion 4/5      [facility] Adult PT Treatment/Exercise - 02/28/14 1416    Lumbar Exercises: Stretches   Single Knee to Chest Stretch 5 reps;30 seconds   Lower Trunk Rotation 10 seconds;Other (comment)   Lower Trunk Rotation Limitations --  x 10 reps   Lumbar Exercises: Supine   Bridge 10 reps           PT Education - 02/28/14 1439    Education provided Yes   Education Details PT, HEP   Person(s) Educated Patient;Spouse   Methods Explanation;Demonstration;Handout   Comprehension Verbalized understanding          Plan - 02/28/14 1440    Clinical Impression Statement Patient presents with limitations in functional mobility due to pain in back and Lt. LE.  She has mild Lt. knee flexion weakness, decr hip ROM.  Her insurance will not cover PT for this diagnosis.  She is going through surgery and radiation at this time, discussed  option to use Home TENS  (husband) fro pain relief. OK for active CA.    Pt will benefit from skilled therapeutic intervention in order to improve on the following deficits Difficulty walking;Decreased range of motion;Impaired flexibility;Postural dysfunction;Increased fascial restricitons;Obesity;Pain;Decreased mobility   Rehab Potential Good   PT Frequency One time visit   PT Next Visit Plan NA   PT Home Exercise Plan trunk rotation, bridging, single knee to chest   Consulted and Agree with Plan of Care Patient         Problem List Patient Active Problem List   Diagnosis Date Noted  . DJD (degenerative joint disease), lumbar 02/22/2014  . Lumbar pain with radiation down left leg 01/17/2014  . Malignant neoplasm of upper inner quadrant of female breast 01/05/2014  . Hyperlipidemia associated with type 2 diabetes mellitus 09/28/2013  . S/P total hysterectomy and bilateral salpingo-oophorectomy 09/20/2013  . Morbid obesity with body mass index of 40.0-44.9 in adult 09/17/2013  . Hypertension   . Diabetes mellitus 09/16/2013    Penni Penado 02/28/2014, 2:49 PM  [facility] [address] [place], [place], [address] Phone: [phone]   Fax:  [phone]

## 2014-02-28 NOTE — Telephone Encounter (Signed)
Pt stated her glucose level running between 20-125 with current metformin dose. Pt want to know by increasing metformin, will her sugar level decrease?

## 2014-03-01 ENCOUNTER — Other Ambulatory Visit: Payer: Self-pay | Admitting: *Deleted

## 2014-03-01 NOTE — Addendum Note (Signed)
Addended by: Jesse Fall on: 03/01/2014 05:22 PM   Modules accepted: Orders

## 2014-03-02 ENCOUNTER — Encounter (HOSPITAL_BASED_OUTPATIENT_CLINIC_OR_DEPARTMENT_OTHER)
Admission: RE | Disposition: A | Discharge status: Home / Self Care | Payer: Self-pay | Source: Ambulatory Visit | Attending: Surgery

## 2014-03-02 ENCOUNTER — Ambulatory Visit (HOSPITAL_BASED_OUTPATIENT_CLINIC_OR_DEPARTMENT_OTHER): Payer: Medicaid Other | Admitting: Anesthesiology

## 2014-03-02 ENCOUNTER — Telehealth: Payer: Self-pay | Admitting: Hematology and Oncology

## 2014-03-02 ENCOUNTER — Ambulatory Visit (HOSPITAL_BASED_OUTPATIENT_CLINIC_OR_DEPARTMENT_OTHER)
Admission: RE | Admit: 2014-03-02 | Discharge: 2014-03-02 | Disposition: A | Discharge status: Home / Self Care | Payer: Medicaid Other | Source: Ambulatory Visit | Attending: Surgery | Admitting: Surgery

## 2014-03-02 ENCOUNTER — Encounter (HOSPITAL_BASED_OUTPATIENT_CLINIC_OR_DEPARTMENT_OTHER): Payer: Self-pay | Admitting: Anesthesiology

## 2014-03-02 DIAGNOSIS — E119 Type 2 diabetes mellitus without complications: Secondary | ICD-10-CM | POA: Insufficient documentation

## 2014-03-02 DIAGNOSIS — Z6841 Body Mass Index (BMI) 40.0 and over, adult: Secondary | ICD-10-CM | POA: Diagnosis not present

## 2014-03-02 DIAGNOSIS — D0512 Intraductal carcinoma in situ of left breast: Secondary | ICD-10-CM | POA: Insufficient documentation

## 2014-03-02 DIAGNOSIS — N641 Fat necrosis of breast: Secondary | ICD-10-CM | POA: Diagnosis not present

## 2014-03-02 DIAGNOSIS — I10 Essential (primary) hypertension: Secondary | ICD-10-CM | POA: Diagnosis not present

## 2014-03-02 DIAGNOSIS — N61 Inflammatory disorders of breast: Secondary | ICD-10-CM | POA: Diagnosis not present

## 2014-03-02 HISTORY — PX: RE-EXCISION OF BREAST LUMPECTOMY: SHX6048

## 2014-03-02 LAB — POCT HEMOGLOBIN-HEMACUE: Hemoglobin: 12.3 g/dL (ref 12.0–15.0)

## 2014-03-02 LAB — GLUCOSE, CAPILLARY: Glucose-Capillary: 100 mg/dL — ABNORMAL HIGH (ref 70–99)

## 2014-03-02 SURGERY — EXCISION, LESION, BREAST
Anesthesia: General | Site: Breast | Laterality: Left

## 2014-03-02 MED ORDER — CEFAZOLIN SODIUM-DEXTROSE 2-3 GM-% IV SOLR: 2.0000 g | INTRAVENOUS | Status: DC

## 2014-03-02 MED ORDER — MIDAZOLAM HCL 5 MG/5ML IJ SOLN
INTRAMUSCULAR | Status: DC | PRN
  Administered 2014-03-02: 2 mg via INTRAVENOUS

## 2014-03-02 MED ORDER — OXYCODONE HCL 5 MG PO TABS
5.0000 mg | ORAL_TABLET | Freq: Once | ORAL | Status: AC | PRN
  Administered 2014-03-02: 5 mg via ORAL

## 2014-03-02 MED ORDER — MEPERIDINE HCL 25 MG/ML IJ SOLN: 6.2500 mg | INTRAMUSCULAR | Status: DC | PRN

## 2014-03-02 MED ORDER — CHLORHEXIDINE GLUCONATE 4 % EX LIQD: 1.0000 "application " | Freq: Once | CUTANEOUS | Status: DC

## 2014-03-02 MED ORDER — OXYCODONE-ACETAMINOPHEN 5-325 MG PO TABS: 1.0000 | ORAL_TABLET | ORAL | Status: DC | PRN

## 2014-03-02 MED ORDER — MIDAZOLAM HCL 2 MG/2ML IJ SOLN: 1.0000 mg | INTRAMUSCULAR | Status: DC | PRN

## 2014-03-02 MED ORDER — HYDROMORPHONE HCL 1 MG/ML IJ SOLN
INTRAMUSCULAR | Status: AC
  Filled 2014-03-02: qty 1

## 2014-03-02 MED ORDER — OXYCODONE HCL 5 MG/5ML PO SOLN: 5.0000 mg | Freq: Once | ORAL | Status: AC | PRN

## 2014-03-02 MED ORDER — FENTANYL CITRATE 0.05 MG/ML IJ SOLN
INTRAMUSCULAR | Status: DC | PRN
  Administered 2014-03-02 (×3): 50 ug via INTRAVENOUS

## 2014-03-02 MED ORDER — OXYCODONE HCL 5 MG PO TABS
ORAL_TABLET | ORAL | Status: AC
  Filled 2014-03-02: qty 1

## 2014-03-02 MED ORDER — FENTANYL CITRATE 0.05 MG/ML IJ SOLN: 50.0000 ug | INTRAMUSCULAR | Status: DC | PRN

## 2014-03-02 MED ORDER — SCOPOLAMINE 1 MG/3DAYS TD PT72
MEDICATED_PATCH | TRANSDERMAL | Status: AC
  Filled 2014-03-02: qty 1

## 2014-03-02 MED ORDER — DEXAMETHASONE SODIUM PHOSPHATE 4 MG/ML IJ SOLN
INTRAMUSCULAR | Status: DC | PRN
  Administered 2014-03-02: 10 mg via INTRAVENOUS

## 2014-03-02 MED ORDER — MIDAZOLAM HCL 2 MG/2ML IJ SOLN
INTRAMUSCULAR | Status: AC
  Filled 2014-03-02: qty 2

## 2014-03-02 MED ORDER — FENTANYL CITRATE 0.05 MG/ML IJ SOLN
INTRAMUSCULAR | Status: AC
  Filled 2014-03-02: qty 6

## 2014-03-02 MED ORDER — BUPIVACAINE-EPINEPHRINE 0.25% -1:200000 IJ SOLN
INTRAMUSCULAR | Status: DC | PRN
  Administered 2014-03-02: 10 mL

## 2014-03-02 MED ORDER — HYDROMORPHONE HCL 1 MG/ML IJ SOLN
0.2500 mg | INTRAMUSCULAR | Status: DC | PRN
  Administered 2014-03-02: 0.25 mg via INTRAVENOUS
  Administered 2014-03-02 (×2): 0.5 mg via INTRAVENOUS
  Administered 2014-03-02: 0.25 mg via INTRAVENOUS

## 2014-03-02 MED ORDER — PROPOFOL 10 MG/ML IV BOLUS
INTRAVENOUS | Status: DC | PRN
  Administered 2014-03-02: 200 mg via INTRAVENOUS

## 2014-03-02 MED ORDER — LIDOCAINE HCL (CARDIAC) 20 MG/ML IV SOLN
INTRAVENOUS | Status: DC | PRN
  Administered 2014-03-02: 50 mg via INTRAVENOUS

## 2014-03-02 MED ORDER — LACTATED RINGERS IV SOLN
INTRAVENOUS | Status: DC
  Administered 2014-03-02 (×2): via INTRAVENOUS

## 2014-03-02 MED ORDER — PROMETHAZINE HCL 25 MG/ML IJ SOLN: 6.2500 mg | INTRAMUSCULAR | Status: DC | PRN

## 2014-03-02 MED ORDER — ONDANSETRON HCL 4 MG/2ML IJ SOLN
INTRAMUSCULAR | Status: DC | PRN
  Administered 2014-03-02: 4 mg via INTRAVENOUS

## 2014-03-02 MED ORDER — CEFAZOLIN SODIUM-DEXTROSE 2-3 GM-% IV SOLR
INTRAVENOUS | Status: DC | PRN
  Administered 2014-03-02: 2 g via INTRAVENOUS

## 2014-03-02 SURGICAL SUPPLY — 45 items
APPLIER CLIP 9.375 MED OPEN (MISCELLANEOUS) ×2
BINDER BREAST LRG (GAUZE/BANDAGES/DRESSINGS) IMPLANT
BINDER BREAST MEDIUM (GAUZE/BANDAGES/DRESSINGS) IMPLANT
BINDER BREAST XLRG (GAUZE/BANDAGES/DRESSINGS) IMPLANT
BINDER BREAST XXLRG (GAUZE/BANDAGES/DRESSINGS) ×2 IMPLANT
BLADE SURG 15 STRL LF DISP TIS (BLADE) ×1 IMPLANT
BLADE SURG 15 STRL SS (BLADE) ×1
CANISTER SUCT 1200ML W/VALVE (MISCELLANEOUS) ×2 IMPLANT
CHLORAPREP W/TINT 26ML (MISCELLANEOUS) ×2 IMPLANT
CLIP APPLIE 9.375 MED OPEN (MISCELLANEOUS) ×1 IMPLANT
CLIP TI WIDE RED SMALL 6 (CLIP) IMPLANT
COVER BACK TABLE 60X90IN (DRAPES) ×2 IMPLANT
COVER MAYO STAND STRL (DRAPES) ×2 IMPLANT
DECANTER SPIKE VIAL GLASS SM (MISCELLANEOUS) ×2 IMPLANT
DEVICE DUBIN W/COMP PLATE 8390 (MISCELLANEOUS) IMPLANT
DRAPE LAPAROSCOPIC ABDOMINAL (DRAPES) ×2 IMPLANT
DRAPE LAPAROTOMY 100X72 PEDS (DRAPES) ×2 IMPLANT
DRAPE UTILITY XL STRL (DRAPES) ×2 IMPLANT
ELECT COATED BLADE 2.86 ST (ELECTRODE) ×2 IMPLANT
ELECT REM PT RETURN 9FT ADLT (ELECTROSURGICAL) ×2
ELECTRODE REM PT RTRN 9FT ADLT (ELECTROSURGICAL) ×1 IMPLANT
GLOVE BIO SURGEON STRL SZ 6.5 (GLOVE) ×2 IMPLANT
GLOVE BIOGEL PI IND STRL 8 (GLOVE) ×1 IMPLANT
GLOVE BIOGEL PI INDICATOR 8 (GLOVE) ×1
GLOVE ECLIPSE 8.0 STRL XLNG CF (GLOVE) ×2 IMPLANT
GOWN STRL REUS W/ TWL LRG LVL3 (GOWN DISPOSABLE) ×2 IMPLANT
GOWN STRL REUS W/TWL LRG LVL3 (GOWN DISPOSABLE) ×2
KIT MARKER MARGIN INK (KITS) IMPLANT
LIQUID BAND (GAUZE/BANDAGES/DRESSINGS) ×2 IMPLANT
NEEDLE HYPO 25X1 1.5 SAFETY (NEEDLE) ×2 IMPLANT
NS IRRIG 1000ML POUR BTL (IV SOLUTION) ×2 IMPLANT
PACK BASIN DAY SURGERY FS (CUSTOM PROCEDURE TRAY) ×2 IMPLANT
PENCIL BUTTON HOLSTER BLD 10FT (ELECTRODE) ×2 IMPLANT
SLEEVE SCD COMPRESS KNEE MED (MISCELLANEOUS) ×2 IMPLANT
SPONGE LAP 4X18 X RAY DECT (DISPOSABLE) ×2 IMPLANT
STAPLER VISISTAT 35W (STAPLE) IMPLANT
SUT MON AB 4-0 PC3 18 (SUTURE) ×2 IMPLANT
SUT SILK 2 0 SH (SUTURE) ×2 IMPLANT
SUT VIC AB 3-0 SH 27 (SUTURE) ×1
SUT VIC AB 3-0 SH 27X BRD (SUTURE) ×1 IMPLANT
SYR CONTROL 10ML LL (SYRINGE) ×2 IMPLANT
TOWEL OR 17X24 6PK STRL BLUE (TOWEL DISPOSABLE) ×4 IMPLANT
TOWEL OR NON WOVEN STRL DISP B (DISPOSABLE) ×2 IMPLANT
TUBE CONNECTING 20X1/4 (TUBING) ×2 IMPLANT
YANKAUER SUCT BULB TIP NO VENT (SUCTIONS) ×2 IMPLANT

## 2014-03-02 NOTE — H&P (View-Only) (Signed)
Rachel Herman 02/08/2014 3:10 PM Location: [facility] Patient #: [id number] DOB: 02-09-1950 Married / Language: English / Race: White Female History of Present Illness Rachel Herman A. [name] MD; 02/08/2014 3:34 PM) Patient words: post Op breast   POST OF FROM LUMPECTOMY CLOSE MARGINS AND NEED REEXCISION. HAS SEEN [facility]. DOING WELL.    1. Breast, lumpectomy, left - INVASIVE GRADE II DUCTAL CARCINOMA, SPANNING 1.7 CM IN GREATEST DIMENSION. - SECOND FOCUS OF INVASIVE GRADE I DUCTAL CARCINOMA, SPANNING 0.5 CM IN GREATEST DIMENSION. - INTERMEDIATE GRADE DUCTAL CARCINOMA IN SITU ASSOCIATED WITH BOTH FOCI OF TUMOR. - INVASIVE DUCTAL CARCINOMA IS EXTREMELY CLOSE (LESS THAN 0.1 CM) TO INFERIOR MARGIN. - DUCTAL CARCINOMA IN SITU IS CLOSE (0.1 CM) TO INFERIOR MARGIN. - OTHER MARGINS NEGATIVE. - SEE ONCOLOGY TEMPLATE. 2. Lymph node, sentinel, biopsy, Left axillary 1 of 4 FINAL for Rachel, Herman ([id number]) Diagnosis(continued) - ONE BENIGN LYMPH NODE WITH NO TUMOR SEEN (0/1). 3. Lymph node, sentinel, biopsy, Left axilla - ONE BENIGN LYMPH NODE WITH NO TUMOR SEEN (0/1). Microscopic Comment 1. BREAST, INVASIVE TUMOR, WITH LYMPH NODES PRESENT Specimen, including laterality and lymph node sampling (sentinel, non-sentinel): Left partial breast with left sentinel lymph node sampling. Procedure: Left breast lumpectomy with sentinel lymph node biopsies. Histologic type: Invasive ductal carcinoma. Larger focus: Grade: II. Tubule formation: 2. Nuclear pleomorphism: 2. Mitotic: 3. Smaller focus (near inferior margin): Grade: I. Tubule formation: 1. Nuclear pleomorphism: 2. Mitotic: 1. Tumor sizes (gross and glass slide measurement): 1.7 cm and 0.5 cm. Margins: Invasive, distance to closest margin: less than 0.1 cm (inferior margin). In-situ, distance to closest margin: 0.1 cm (inferior margin). Lymphovascular invasion: Definitive lymphovascular  invasion is not identified. Ductal carcinoma in situ: Yes. Grade: Intermediate grade. Extensive intraductal component: There is an extensive intraductal component of ductal carcinoma in situ on the smaller focus but not on the larger focus. Lobular neoplasia: No. Tumor focality: Two foci, multifocal. Treatment effect: N/A. Extent of tumor: Tumor confined to breast parenchyma. Skin: Not received. Nipple: Not received. Skeletal muscle: Not received. Lymph nodes: Examined: 2 Sentinel. 0 Non-sentinel. 2 Total. Lymph nodes with metastasis: 0. Breast prognostic profile: Performed on previous case ([id number]). Estrogen receptor: 100%, positive. Progesterone receptor: 96%, positive. Her 2 neu by CISH: 0.81 ratio, no amplification. Ki-67: 73%. Non-neoplastic breast: Fat necrosis. TNM: pT1c(m), pN0, MX. Comments: A smooth muscle myosin, calponin, and p63 immunohistochemical stain are performed on a single block, which helps to confirm the presence of a second focus of invasive ductal carcinoma associated 2 of 4 FINAL for Rachel, Herman ([id number]) Microscopic Comment(continued) with ductal carcinoma in situ. As Her-2 neu by CISH was negative on the initial biopsy, this will be repeated on the larger tumor focus and reported in an addendum to follow. A breast prognostic profile will not be performed on the smaller tumor focus unless otherwise requested (the tumor foci although different grades show morphologically similar nuclear features). (RH:ds 12/31/13) Willeen Niece MD Pathologist, Electronic Signature (Case signed 01/03/2014) Specimen Gross and Clinical Information Specimen(s) Obtained: 1. Breast, lumpectomy, left 2. Lymph node, sentinel, biopsy, Left axillary 3. Lymph node, sentinel, biopsy, Left axilla Specimen Clinical.  The patient is a 64 year old female   Vitals Briant Cedar CMA; 02/08/2014 3:11 PM) 02/08/2014 3:10 PM Weight: 241 lb Height: 64in Body  Surface Area: 2.22 m Body Mass Index: 41.37 kg/m Temp.: 40F  Pulse: 96 (Regular)  BP: 140/80 (Sitting, Left Arm, Standard)     Physical Exam (Raeann Offner A.  Celena Lanius MD; 02/08/2014 3:35 PM)  Breast Note: INCISIONS CDI soft non tender left breast     Assessment & Plan (Beda Dula A. Chontel Warning MD; 02/08/2014 3:35 PM)  BREAST CANCER, LEFT (174.9  C50.912) Impression: Risk of lumpectomy include bleeding, infection, seroma, more surgery, use of seed/wire, wound care, cosmetic deformity and the need for other treatments, death , blood clots, death. Pt agrees to proceed. Risk of sentinel lymph node mapping include bleeding, infection, lymphedema, shoulder pain. stiffness, dye allergy. cosmetic deformity , blood clots, death, need for more surgery. Pt agres to proceed.  PT OPTED FOR BREAST CONSERVATION OVER MASTECTOMY/ RECONSTRUCTION. LEFT BREAST SEED LOCALIZED LUMPECTOMY AND SLN MAPPING.  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 Explained to the patient and she agrees to proceed. Risk of lumpectomy include bleeding, infection, seroma, more surgery, use of seed/wire, wound care, cosmetic deformity and the need for other treatments, death , blood clots, death. Pt agrees to proceed.

## 2014-03-02 NOTE — Interval H&P Note (Signed)
History and Physical Interval Note:  03/02/2014 10:21 AM  Rachel Herman  has presented today for surgery, with the diagnosis of Left Breast Cancer and DCIS  The various methods of treatment have been discussed with the patient and family. After consideration of risks, benefits and other options for treatment, the patient has consented to  Procedure(s): RE-EXCISION OF LEFT BREAST LUMPECTOMY (Left) as a surgical intervention .  The patient's history has been reviewed, patient examined, no change in status, stable for surgery.  I have reviewed the patient's chart and labs.  Questions were answered to the patient's satisfaction.     [name] A.

## 2014-03-02 NOTE — Telephone Encounter (Signed)
due to pal moved 6/9 appt to 6/1. left message for pt and mailed schedule. also confirmed 5/27 lab also.

## 2014-03-02 NOTE — Anesthesia Preprocedure Evaluation (Signed)
Anesthesia Evaluation  Patient identified by MRN, date of birth, ID band Patient awake    Reviewed: Allergy & Precautions, H&P , NPO status , Patient's Chart, lab work & pertinent test results  History of Anesthesia Complications (+) PONV  Airway Mallampati: III  TM Distance: >3 FB Neck ROM: Full    Dental no notable dental hx. (+) Teeth Intact, Dental Advisory Given   Pulmonary neg pulmonary ROS,  breath sounds clear to auscultation  Pulmonary exam normal       Cardiovascular hypertension, Pt. on medications Rhythm:Regular Rate:Normal     Neuro/Psych negative neurological ROS  negative psych ROS   GI/Hepatic negative GI ROS, Neg liver ROS,   Endo/Other  diabetes, Type 2, Oral Hypoglycemic AgentsMorbid obesity  Renal/GU negative Renal ROS  negative genitourinary   Musculoskeletal   Abdominal   Peds  Hematology negative hematology ROS (+)   Anesthesia Other Findings   Reproductive/Obstetrics negative OB ROS                             Anesthesia Physical  Anesthesia Plan  ASA: III  Anesthesia Plan: General   Post-op Pain Management:    Induction: Intravenous  Airway Management Planned: LMA  Additional Equipment: None  Intra-op Plan:   Post-operative Plan: Extubation in OR  Informed Consent: I have reviewed the patients History and Physical, chart, labs and discussed the procedure including the risks, benefits and alternatives for the proposed anesthesia with the patient or authorized representative who has indicated his/her understanding and acceptance.   Dental advisory given  Plan Discussed with: CRNA  Anesthesia Plan Comments:         Anesthesia Quick Evaluation

## 2014-03-02 NOTE — Interval H&P Note (Signed)
History and Physical Interval Note:  03/02/2014 2:12 PM  Rachel Herman  has presented today for surgery, with the diagnosis of Left Breast Cancer and DCIS  The various methods of treatment have been discussed with the patient and family. After consideration of risks, benefits and other options for treatment, the patient has consented to  Procedure(s): RE-EXCISION OF LEFT BREAST LUMPECTOMY (Left) as a surgical intervention .  The patient's history has been reviewed, patient examined, no change in status, stable for surgery.  I have reviewed the patient's chart and labs.  Questions were answered to the patient's satisfaction.     Shalisha Clausing A.

## 2014-03-02 NOTE — Anesthesia Procedure Notes (Signed)
Procedure Name: LMA Insertion Date/Time: 03/02/2014 2:46 PM Performed by: Toula Moos L Pre-anesthesia Checklist: Patient identified, Emergency Drugs available, Suction available, Patient being monitored and Timeout performed Patient Re-evaluated:Patient Re-evaluated prior to inductionOxygen Delivery Method: Circle System Utilized Preoxygenation: Pre-oxygenation with 100% oxygen Intubation Type: IV induction Ventilation: Mask ventilation without difficulty LMA: LMA inserted LMA Size: 4.0 Number of attempts: 1 Airway Equipment and Method: Bite block Placement Confirmation: positive ETCO2 Tube secured with: Tape Dental Injury: Teeth and Oropharynx as per pre-operative assessment

## 2014-03-02 NOTE — Op Note (Signed)
Breast Re-excison Lumpectomy Procedure Note Left   Indications:  This patient returns following an initial lumpectomy.  Analysis of the pathology specimen revealed microscopic involvement of the inferior margins.  The patient now returns for re-excision.The procedure has been discussed with the patient. Alternatives to surgery have been discussed with the patient.  Risks of surgery include bleeding,  Infection,  Seroma formation, death,  and the need for further surgery.   The patient understands and wishes to proceed.  Pre-operative Diagnosis: left breast cancer  Post-operative Diagnosis: left breast cancer  Surgeon: Erroll Luna A.   Assistants: OR staff  Anesthesia: General LMA anesthesia  ASA Class: 3  Procedure Details  The patient was seen in the Holding Room. The risks, benefits, complications, treatment options, and expected outcomes were discussed with the patient. The possibilities of reaction to medication, pulmonary aspiration, bleeding, infection, the need for additional procedures, failure to diagnose a condition, and creating a complication requiring transfusion or operation were discussed with the patient. The patient concurred with the proposed plan, giving informed consent.  The site of surgery properly noted/marked. The patient was taken to Operating Room # 8, identified as Rachel Herman and the procedure verified as Breast Re-excision Lumpectomy. A Time Out was held and the above information confirmed.  the patient was placed supine.  The breast was prepped and draped in standard fashion. Marcaine 0.25% with epinephrine was used to anesthetize the skin around the previous lumpectomy incision.  The incision was opened.  A  seroma was evacuated.  Additional local anesthesia was delivered inferiorly within the lumpectomy cavity.  A full thickness re-excision was performed.  An orientation suture was placed inferiorly.  The new margin was inked and the specimen was submitted  to pathology.  Hemostasis was achieved with cautery.  Closure was performed in 2 layers with a 3-0 Vicryl and 4 0 monocryl subcuticular closure.    Steri-Strips were applied.  At the end of the operation, all sponge, instrument and needle counts were correct.   Findings: grossly clear surgical margins  Estimated Blood Loss:  Minimal                 Total IV Fluids: 639ml         Specimens: above         Implants: none         Complications:  None; patient tolerated the procedure well.         Disposition: PACU - hemodynamically stable.         Condition: stable  Attending Attestation: I performed the procedure.

## 2014-03-02 NOTE — Discharge Instructions (Signed)
[facility] °Office Phone Number [phone] ° °BREAST BIOPSY/ PARTIAL MASTECTOMY: POST OP INSTRUCTIONS ° °Always review your discharge instruction sheet given to you by the facility where your surgery was performed. ° °IF YOU HAVE DISABILITY OR FAMILY LEAVE FORMS, YOU MUST BRING THEM TO THE OFFICE FOR PROCESSING.  DO NOT GIVE THEM TO YOUR DOCTOR. ° °1. A prescription for pain medication may be given to you upon discharge.  Take your pain medication as prescribed, if needed.  If narcotic pain medicine is not needed, then you may take acetaminophen (Tylenol) or ibuprofen (Advil) as needed. °2. Take your usually prescribed medications unless otherwise directed °3. If you need a refill on your pain medication, please contact your pharmacy.  They will contact our office to request authorization.  Prescriptions will not be filled after 5pm or on week-ends. °4. You should eat very light the first 24 hours after surgery, such as soup, crackers, pudding, etc.  Resume your normal diet the day after surgery. °5. Most patients will experience some swelling and bruising in the breast.  Ice packs and a good support bra will help.  Swelling and bruising can take several days to resolve.  °6. It is common to experience some constipation if taking pain medication after surgery.  Increasing fluid intake and taking a stool softener will usually help or prevent this problem from occurring.  A mild laxative (Milk of Magnesia or Miralax) should be taken according to package directions if there are no bowel movements after 48 hours. °7. Unless discharge instructions indicate otherwise, you may remove your bandages 24-48 hours after surgery, and you may shower at that time.  You may have steri-strips (small skin tapes) in place directly over the incision.  These strips should be left on the skin for 7-10 days.  If your surgeon used skin glue on the incision, you may shower in 24 hours.  The glue will flake off over the  next 2-3 weeks.  Any sutures or staples will be removed at the office during your follow-up visit. °8. ACTIVITIES:  You may resume regular daily activities (gradually increasing) beginning the next day.  Wearing a good support bra or sports bra minimizes pain and swelling.  You may have sexual intercourse when it is comfortable. °a. You may drive when you no longer are taking prescription pain medication, you can comfortably wear a seatbelt, and you can safely maneuver your car and apply brakes. °b. RETURN TO WORK:  ______________________________________________________________________________________ °9. You should see your doctor in the office for a follow-up appointment approximately two weeks after your surgery.  Your doctor’s nurse will typically make your follow-up appointment when she calls you with your pathology report.  Expect your pathology report 2-3 business days after your surgery.  You may call to check if you do not hear from us after three days. °10. OTHER INSTRUCTIONS: _______________________________________________________________________________________________ _____________________________________________________________________________________________________________________________________ °_____________________________________________________________________________________________________________________________________ °_____________________________________________________________________________________________________________________________________ ° °WHEN TO CALL YOUR DOCTOR: °1. Fever over 101.0 °2. Nausea and/or vomiting. °3. Extreme swelling or bruising. °4. Continued bleeding from incision. °5. Increased pain, redness, or drainage from the incision. ° °The clinic staff is available to answer your questions during regular business hours.  Please don’t hesitate to call and ask to speak to one of the nurses for clinical concerns.  If you have a medical emergency, go to the nearest  emergency room or call 911.  A surgeon from [facility] is always on call at the hospital. ° °For further questions, please visit [email or web address]  ° ° °  Post Anesthesia Home Care Instructions ° °Activity: °Get plenty of rest for the remainder of the day. A responsible adult should stay with you for 24 hours following the procedure.  °For the next 24 hours, DO NOT: °-Drive a car °-Operate machinery °-Drink alcoholic beverages °-Take any medication unless instructed by your physician °-Make any legal decisions or sign important papers. ° °Meals: °Start with liquid foods such as gelatin or soup. Progress to regular foods as tolerated. Avoid greasy, spicy, heavy foods. If nausea and/or vomiting occur, drink only clear liquids until the nausea and/or vomiting subsides. Call your physician if vomiting continues. ° °Special Instructions/Symptoms: °Your throat may feel dry or sore from the anesthesia or the breathing tube placed in your throat during surgery. If this causes discomfort, gargle with warm salt water. The discomfort should disappear within 24 hours. ° °

## 2014-03-02 NOTE — Transfer of Care (Signed)
Immediate Anesthesia Transfer of Care Note  Patient: Rachel Herman  Procedure(s) Performed: Procedure(s): RE-EXCISION OF LEFT BREAST LUMPECTOMY (Left)  Patient Location: PACU  Anesthesia Type:General  Level of Consciousness: awake and patient cooperative  Airway & Oxygen Therapy: Patient Spontanous Breathing and Patient connected to face mask oxygen  Post-op Assessment: Report given to PACU RN and Post -op Vital signs reviewed and stable  Post vital signs: Reviewed and stable  Complications: No apparent anesthesia complications

## 2014-03-02 NOTE — Anesthesia Postprocedure Evaluation (Signed)
Anesthesia Post Note  Patient: Rachel Herman  Procedure(s) Performed: Procedure(s) (LRB): RE-EXCISION OF LEFT BREAST LUMPECTOMY (Left)  Anesthesia type: General  Patient location: PACU  Post pain: Pain level controlled  Post assessment: Post-op Vital signs reviewed  Last Vitals: BP 111/66 mmHg  Pulse 70  Temp(Src) 36.4 C (Oral)  Resp 16  Ht 5\' 4"  (1.626 m)  Wt 244 lb (110.678 kg)  BMI 41.86 kg/m2  SpO2 100%  Post vital signs: Reviewed  Level of consciousness: sedated  Complications: No apparent anesthesia complications

## 2014-03-03 ENCOUNTER — Encounter (HOSPITAL_BASED_OUTPATIENT_CLINIC_OR_DEPARTMENT_OTHER): Payer: Self-pay | Admitting: Surgery

## 2014-03-03 NOTE — Addendum Note (Signed)
Addendum  created 03/03/14 1406 by Tawni Millers, CRNA   Modules edited: Charges VN

## 2014-03-04 ENCOUNTER — Encounter: Payer: Self-pay | Admitting: *Deleted

## 2014-03-04 NOTE — Progress Notes (Signed)
[place] Psychosocial Distress Screening Clinical Social Work  Clinical Social Work was referred by distress screening protocol.  The patient scored a 5 on the Psychosocial Distress Thermometer which indicates moderate distress. Clinical Social Worker phoned pt to assess for distress and other psychosocial needs. Pt doing well currently. Denies concerns at this time. She shared she has had ongoing concerns with anxiety, but that walking helps. CSW reviewed support resources and options at the [facility] to de-stress and relieve anxiety. CSW provided pt with number for CSW team. No other needs noted.    ONCBCN DISTRESS SCREENING 03/01/2014  Screening Type Initial Screening  Distress experienced in past week (1-10) 5  Practical problem type   Emotional problem type Nervousness/Anxiety;Adjusting to illness  Information Concerns Type   Physical Problem type Pain;Sleep/insomnia;Constipation/diarrhea  Physician notified of physical symptoms Yes  Referral to clinical social work Yes    Clinical Social Worker follow up needed: No.  If yes, follow up plan: Loren Racer, [place]  [facility] Phone: [phone] Fax: [phone]

## 2014-03-09 ENCOUNTER — Encounter: Payer: Self-pay | Admitting: Family Medicine

## 2014-03-09 ENCOUNTER — Ambulatory Visit: Payer: Medicaid Other | Attending: Family Medicine | Admitting: Family Medicine

## 2014-03-09 VITALS — BP 120/88 | HR 85 | Temp 98.0°F | Resp 16 | Ht 64.0 in | Wt 242.6 lb

## 2014-03-09 DIAGNOSIS — E669 Obesity, unspecified: Secondary | ICD-10-CM | POA: Insufficient documentation

## 2014-03-09 DIAGNOSIS — F329 Major depressive disorder, single episode, unspecified: Secondary | ICD-10-CM

## 2014-03-09 DIAGNOSIS — M160 Bilateral primary osteoarthritis of hip: Secondary | ICD-10-CM | POA: Diagnosis not present

## 2014-03-09 DIAGNOSIS — Z6841 Body Mass Index (BMI) 40.0 and over, adult: Secondary | ICD-10-CM | POA: Diagnosis not present

## 2014-03-09 DIAGNOSIS — E119 Type 2 diabetes mellitus without complications: Secondary | ICD-10-CM | POA: Insufficient documentation

## 2014-03-09 DIAGNOSIS — M545 Low back pain, unspecified: Secondary | ICD-10-CM

## 2014-03-09 DIAGNOSIS — M47896 Other spondylosis, lumbar region: Secondary | ICD-10-CM | POA: Diagnosis not present

## 2014-03-09 DIAGNOSIS — M79605 Pain in left leg: Secondary | ICD-10-CM

## 2014-03-09 DIAGNOSIS — M169 Osteoarthritis of hip, unspecified: Secondary | ICD-10-CM | POA: Diagnosis present

## 2014-03-09 DIAGNOSIS — M4726 Other spondylosis with radiculopathy, lumbar region: Secondary | ICD-10-CM

## 2014-03-09 DIAGNOSIS — R4589 Other symptoms and signs involving emotional state: Secondary | ICD-10-CM | POA: Insufficient documentation

## 2014-03-09 LAB — GLUCOSE, POCT (MANUAL RESULT ENTRY): POC Glucose: 156 mg/dl — AB (ref 70–99)

## 2014-03-09 MED ORDER — VENLAFAXINE HCL 75 MG PO TABS: 75.0000 mg | ORAL_TABLET | Freq: Two times a day (BID) | ORAL | Status: DC

## 2014-03-09 MED ORDER — TRAMADOL HCL 50 MG PO TABS: 25.0000 mg | ORAL_TABLET | Freq: Three times a day (TID) | ORAL | Status: DC | PRN

## 2014-03-09 NOTE — Progress Notes (Signed)
   Subjective:    Patient ID: Rachel Herman, female    DOB: [birth year], 64 y.o.   MRN: [id number] CC: f/u hip and back OA and depressed mood  HPI 64 yo F with hx of breast cancer recent biopsy done of recurrent mass with negative margins presents for f/u visit:  1. Depressed mood: sleeping poorly and feeling down about breast cancer, chronic low back and hip pain. Amenable to medication and cognitive behavioral therapy. Denies anxiety.   2.  Back and hip pains: has OA. Exercise is minimal. Not taking tylenol because currently taking percocet following breast biopsy. Ran out of tramadol.  3. Diabetes: checking CBGs 75-160. No low CBGs. Compliant with metformin.    Review of Systems As per HPI  GAD-7: score of 11. 1-1-5 and 7. 3-1 and 6.     Objective:   Physical Exam BP 120/88 mmHg  Pulse 85  Temp(Src) 98 F (36.7 C)  Resp 16  Ht 5\' 4"  (1.626 m)  Wt 242 lb 9.6 oz (110.043 kg)  BMI 41.62 kg/m2  SpO2 96% General appearance: alert, cooperative and no distress Breast: closed incision. Minimal bruising.  Lungs: CTA b/l Heart S1S2 RRR   CBG 156    Assessment & Plan:

## 2014-03-09 NOTE — Assessment & Plan Note (Signed)
1. Diabetes: well controlled

## 2014-03-09 NOTE — Assessment & Plan Note (Signed)
Depressed mood and pain from back and hips: effexor 75 mg, start with 1/2 tab twice daily with breakfast and supper. After 4 days increase to a whole tab twice daily.

## 2014-03-09 NOTE — Progress Notes (Signed)
Patient here to follow up on b/l hip pain Patient does not know if she needs any refills Patient had breast surgery at [facility] hospital 03/02/14

## 2014-03-09 NOTE — Assessment & Plan Note (Signed)
A: DJD in obese patient P: Weight loss effexor for chronic pain and associated depressed mood Tylenol 500-650 mg TID Tramadol prn breakthrough pain.

## 2014-03-09 NOTE — Patient Instructions (Signed)
Mrs. Dethlefs,  Thank you for coming in today.   1. Diabetes: well controlled  2. HTN: BP at goal.   3. Depressed mood and pain from back and hips: effexor 75 mg, start with 1/2 tab twice daily with breakfast and supper. After 4 days increase to a whole tab twice daily.  F/u in 3 weeks   Dr. Adrian Blackwater

## 2014-03-15 NOTE — Progress Notes (Signed)
FUNC  Left Breast Cancer  Diagnosis 03/02/14: Breast, excision, left - FAT NECROSIS, FIBROSIS AND CHRONIC INFLAMMATION, CONSISTENT WITH PRIORLUMPECTOMY.- NO ATYPIA, IN SITU OR INVASIVE MALIGNANCY IDENTIFIED.- MARGIN FREE OF NEOPLASM. Dr. [name] Cornett,MD Seroma evacuated,  Oncotype DX results=23   1. Breast, lumpectomy, left: 02/08/2014: - INVASIVE GRADE II DUCTAL CARCINOMA, SPANNING 1.7 CM IN GREATEST DIMENSION.- SECOND FOCUS OF INVASIVE GRADE I DUCTAL CARCINOMA, SPANNING 0.5 CM IN GREATEST DIMENSION.- INTERMEDIATE GRADE DUCTAL CARCINOMA IN SITU ASSOCIATED WITH BOTH FOCI OFTUMOR.- INVASIVE DUCTAL CARCINOMA IS EXTREMELY CLOSE (LESS THAN 0.1 CM) TO INFERIOR MARGIN.- DUCTAL CARCINOMA IN SITU IS CLOSE (0.1 CM) TO INFERIOR MARGIN.- OTHER MARGINS NEGATIVE.- SEE ONCOLOGY TEMPLATE.2. Lymph node, sentinel, biopsy, Left axillary1 of 4FINAL for Rachel Herman, Rachel Herman ([id number]) Diagnosis(continued)- ONE BENIGN LYMPH NODE WITH NO TUMOR SEEN (0/1).3. Lymph node, sentinel, biopsy, Left axilla- ONE BENIGN LYMPH NODE WITH NO TUMOR SEEN (0/1).Microscopic Comment1. BREAST, INVASIVE TUMOR, WITH LYMPH NODES PRESENTSpecimen, including laterality and lymph node sampling (sentinel, non-sentinel): Left partial breast with leftsentinel lymph node sampling.Procedure: Left breast lumpectomy with sentinel lymph node biopsies.Histologic type: Invasive ductal carcinoma.Larger focus:Grade: II. Dr. Erroll Luna, MD,   Left breast healing at incision mid left breast, tenderness,soreness 3/10 pain level  Pain lower back left side to hip  And leg,  from falling hospital last August,takes tramadol q 8 hours and e/s. Tylenol 500 mg q 4 hours, pain 9/10 scale now appetite fair, took herself off effexor,said it made her more depressed and sleeping all the time, energy level low, "tired all the time" 2:14 PM '

## 2014-03-16 ENCOUNTER — Ambulatory Visit
Admission: RE | Admit: 2014-03-16 | Discharge: 2014-03-16 | Disposition: A | Discharge status: Home / Self Care | Payer: Medicaid Other | Source: Ambulatory Visit | Attending: Radiation Oncology | Admitting: Radiation Oncology

## 2014-03-16 ENCOUNTER — Other Ambulatory Visit: Payer: Self-pay | Admitting: Radiation Oncology

## 2014-03-16 ENCOUNTER — Encounter: Payer: Self-pay | Admitting: Radiation Oncology

## 2014-03-16 VITALS — BP 165/87 | HR 90 | Temp 98.5°F | Resp 20 | Ht 64.0 in | Wt 241.6 lb

## 2014-03-16 DIAGNOSIS — L598 Other specified disorders of the skin and subcutaneous tissue related to radiation: Secondary | ICD-10-CM | POA: Insufficient documentation

## 2014-03-16 DIAGNOSIS — C50212 Malignant neoplasm of upper-inner quadrant of left female breast: Secondary | ICD-10-CM | POA: Diagnosis not present

## 2014-03-16 DIAGNOSIS — Z7982 Long term (current) use of aspirin: Secondary | ICD-10-CM | POA: Insufficient documentation

## 2014-03-16 DIAGNOSIS — Z510 Encounter for antineoplastic radiation therapy: Secondary | ICD-10-CM | POA: Diagnosis not present

## 2014-03-16 DIAGNOSIS — C50512 Malignant neoplasm of lower-outer quadrant of left female breast: Secondary | ICD-10-CM

## 2014-03-16 DIAGNOSIS — Z170 Estrogen receptor positive status [ER+]: Secondary | ICD-10-CM | POA: Insufficient documentation

## 2014-03-16 DIAGNOSIS — C50219 Malignant neoplasm of upper-inner quadrant of unspecified female breast: Secondary | ICD-10-CM

## 2014-03-16 NOTE — Progress Notes (Signed)
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  The patient was originally seen in multidisciplinary clinic and was felt to be a good candidate for breast conservation treatment.  She proceeded with a lumpectomy and then underwent a re-excision on 03/02/2014. No further insight she or invasive malignancy was identified. The patient's tumor on lumpectomy specimen corresponded to a T1 cN0 M0 tumor. Receptor studies have indicated that the tumor is ER positive, PR positive, and HER-2/neu negative. An Oncotype test was obtained and this returned with a recurrent score of 23. She has discussed this with medical oncology and it was not recommended to proceed with chemotherapy. She therefore presents today for further discussion and coordination of an upcoming course of adjuvant radiation treatment.                             ALLERGIES:  has No Known Allergies.  Meds: Current Outpatient Prescriptions  Medication Sig Dispense Refill  . ACCU-CHEK FASTCLIX LANCETS MISC 1 Units by Percutaneous route 4 (four) times daily. 100 each 12  . acetaminophen (TYLENOL) 500 MG tablet Take 500 mg by mouth every 6 (six) hours as needed.    . Ascorbic Acid (VITAMIN C) 1000 MG tablet Take 1,000 mg by mouth daily.    . Blood Glucose Monitoring Suppl (ACCU-CHEK NANO SMARTVIEW) W/DEVICE KIT 1 kit by Subdermal route as directed. Check blood sugars for fasting, and two hours after breakfast, lunch and dinner (4 checks daily) 1 kit 0  . Docusate Sodium (DSS) 100 MG CAPS Take 100  mg by mouth 2 (two) times daily. 30 each 3  . gabapentin (NEURONTIN) 300 MG capsule Take 2 capsules (600 mg total) by mouth at bedtime. 60 capsule 1  . glucose blood (ACCU-CHEK SMARTVIEW) test strip Use as instructed to check blood sugars 100 each 12  . hydrochlorothiazide (HYDRODIURIL) 25 MG tablet Take 25 mg by mouth daily.  3  . metFORMIN (GLUCOPHAGE) 500 MG tablet Take 1 tablet (500 mg total) by mouth daily after breakfast. (Patient taking differently: Take 500 mg by mouth 2 (two) times daily. ) 90 tablet 0  . Multiple Vitamins-Minerals (MULTIVITAMIN WITH MINERALS) tablet Take 1 tablet by mouth daily.    . traMADol (ULTRAM) 50 MG tablet Take 0.5-1 tablets (25-50 mg total) by mouth every 8 (eight) hours as needed. 90 tablet 0  . vitamin D, CHOLECALCIFEROL, 400 UNITS tablet Take 400 Units by mouth daily.    Marland Kitchen aspirin 325 MG tablet Take 325 mg by mouth once.    Marland Kitchen atorvastatin (LIPITOR) 40 MG tablet Take 1 tablet (40 mg total) by mouth daily. (Patient not taking: Reported on 03/09/2014) 90 tablet 3  . lisinopril-hydrochlorothiazide (PRINZIDE,ZESTORETIC) 20-25 MG per tablet Take 1 tablet by mouth daily. (Patient not taking: Reported on 03/16/2014) 90 tablet 3  . oxyCODONE-acetaminophen (ROXICET) 5-325 MG per tablet Take 1-2 tablets by mouth every 4 (four) hours as needed. (Patient not taking: Reported on 03/16/2014)  30 tablet 0  . venlafaxine (EFFEXOR) 75 MG tablet Take 1 tablet (75 mg total) by mouth 2 (two) times daily with a meal. (Patient not taking: Reported on 03/16/2014) 60 tablet 1   No current facility-administered medications for this encounter.    Physical Findings: The patient is in no acute distress. Patient is alert and oriented.  height is _0  (1.626 m) and weight is 241 lb 9.6 oz (109.589 kg). Her oral temperature is 98.5 F (36.9 C). Her blood pressure is 165/87 and her pulse is 90. Her respiration is 20 and oxygen saturation is 98%. .   The surgical incision is healing well  currently. No sign of infection.  Lab Findings: Lab Results  Component Value Date   WBC 9.9 02/07/2014   HGB 12.3 03/02/2014   HCT 40.4 02/07/2014   MCV 85.4 02/07/2014   PLT 331 02/07/2014     Radiographic Findings: Dg Lumbar Spine Complete  02/21/2014   CLINICAL DATA:  Pain in the center of the back, L4-5 and L5-S1. Bilateral hip pain. No known trauma.  EXAM: LUMBAR SPINE - COMPLETE 4+ VIEW  COMPARISON:  None.  FINDINGS: Mild multilevel degenerative disc disease with anterior endplate osteophytosis W2-6, L3-4 and L4-5. Relative preservation of the intervertebral disc space height. Preservation of vertebral body heights. L4-5 and L5-S1 facet degenerative change. Normal anatomic alignment. No acute osseous abnormality.  IMPRESSION: Lower lumbar spine degenerative change.  No acute osseous abnormality.   Electronically Signed   By: Lovey Newcomer M.D.   On: 02/21/2014 13:52   Dg Hips Bilat With Pelvis 2v  02/21/2014   CLINICAL DATA:  Pain in center of back.  EXAM: DG HIP W/ PELVIS 2V BILAT  COMPARISON:  None.  FINDINGS: Both hips are located. There is no evidence for hip fracture or dislocation. Mild bilateral joint space narrowing, subchondral sclerosis and marginal spur formation noted.  IMPRESSION: 1. Mild bilateral hip osteoarthritis.   Electronically Signed   By: Kerby Moors M.D.   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 She is not proceeding with chemotherapy after discussing her case with medical oncology. She is appropriate therefore to proceed with adjuvant radiation treatment at this time..   I discussed the rationale of adjuvant radiation treatment with the patient. I discussed the expected improvement in  local/regional control. We also discussed the possible side effects and risks of treatment. All of her questions were answered. The patient is interested in proceeding with radiation treatment at this time    Plan:  The patient will undergo a simulation in the near future such that we can proceed with treatment planning. I anticipate treating the patient with a course of hypo-fractionated radiation treatment/4 weeks using whole breast tangent fields to the left breast.     I spent 20 minutes with the patient today, the majority of which was spent counseling the patient on the diagnosis of cancer and coordinating care.   Jodelle Gross, M.D., Ph.D.

## 2014-03-16 NOTE — Progress Notes (Signed)
Please see the Nurse Progress Note in the MD Initial Consult Encounter for this patient. 

## 2014-03-17 ENCOUNTER — Telehealth: Payer: Self-pay | Admitting: *Deleted

## 2014-03-17 NOTE — Telephone Encounter (Signed)
Received message from Hollie Salk in [place] requesting a genetic appt from Dr. Lisbeth Renshaw.  Called Enid Derry to make her aware that Blima Singer in [place] is now scheduling genetic appts.  Sent Kim H an inbasket for her to schedule the pt.

## 2014-03-18 ENCOUNTER — Telehealth: Payer: Self-pay | Admitting: Genetic Counselor

## 2014-03-18 NOTE — Telephone Encounter (Signed)
pt confirmed appt for 04/11/14 for Genetics Dx: genetic testing Referring Dr. Lisbeth Renshaw

## 2014-03-28 ENCOUNTER — Ambulatory Visit
Admission: RE | Admit: 2014-03-28 | Discharge: 2014-03-28 | Disposition: A | Discharge status: Home / Self Care | Payer: Medicaid Other | Source: Ambulatory Visit | Attending: Radiation Oncology | Admitting: Radiation Oncology

## 2014-03-28 DIAGNOSIS — C50212 Malignant neoplasm of upper-inner quadrant of left female breast: Secondary | ICD-10-CM

## 2014-03-28 DIAGNOSIS — Z510 Encounter for antineoplastic radiation therapy: Secondary | ICD-10-CM | POA: Diagnosis not present

## 2014-03-29 NOTE — Progress Notes (Addendum)
  Radiation Oncology         [phone] ________________________________  Name: [name] MRN: [id number]  Date: 03/28/2014  DOB: 09/28/1950  SIMULATION AND TREATMENT PLANNING NOTE  The patient presented for simulation prior to beginning her course of radiation treatment for her diagnosis of left-sided breast cancer. The patient was placed in a supine position on a breast board. A customized vac-lock bag was constructed and this complex treatment device will be used on a daily basis during her treatment. In this fashion, a CT scan was obtained through the chest area and an isocenter was placed near the chest wall within the breast.  The patient will be planned to receive a course of radiation initially to a dose of 42.5 Gy. This will consist of a whole breast radiotherapy technique. To accomplish this, 2 customized blocks have been designed which will correspond to medial and lateral whole breast tangent fields. This treatment will be accomplished at 2.5 Gy per fraction. A forward planning technique will also be evaluated to determine if this approach improves the plan. It is anticipated that the patient will then receive a 7.5 Gy boost to the seroma cavity which has been contoured. This will be accomplished at 2.5 Gy per fraction.   This initial treatment will consist of a 3-D conformal technique. The seroma has been contoured as the primary target structure. Additionally, dose volume histograms of both this target as well as the lungs and heart will also be evaluated. Such an approach is necessary to ensure that the target area is adequately covered while the nearby critical  normal structures are adequately spared.  Plan:  The final anticipated total dose therefore will correspond to 50 Gy.   Addendum Due to the patient's anatomy and the width of the treatment area, a satisfactorily homogeneous plan could not be constructed for a course of hypo-fractionation. The patient therefore  will be treated with a standard 6-1/2 week course of radiation treatment.  _______________________________   Jodelle Gross, MD, PhD

## 2014-03-30 ENCOUNTER — Encounter: Payer: Self-pay | Admitting: Family Medicine

## 2014-03-30 ENCOUNTER — Ambulatory Visit: Payer: Medicaid Other | Attending: Family Medicine | Admitting: Family Medicine

## 2014-03-30 VITALS — BP 125/85 | HR 76 | Temp 98.0°F | Resp 16 | Ht 64.0 in | Wt 244.0 lb

## 2014-03-30 DIAGNOSIS — Z23 Encounter for immunization: Secondary | ICD-10-CM | POA: Diagnosis not present

## 2014-03-30 DIAGNOSIS — M545 Low back pain, unspecified: Secondary | ICD-10-CM

## 2014-03-30 DIAGNOSIS — E119 Type 2 diabetes mellitus without complications: Secondary | ICD-10-CM | POA: Insufficient documentation

## 2014-03-30 DIAGNOSIS — E1169 Type 2 diabetes mellitus with other specified complication: Secondary | ICD-10-CM

## 2014-03-30 DIAGNOSIS — Z0000 Encounter for general adult medical examination without abnormal findings: Secondary | ICD-10-CM | POA: Insufficient documentation

## 2014-03-30 DIAGNOSIS — G8929 Other chronic pain: Secondary | ICD-10-CM | POA: Diagnosis not present

## 2014-03-30 DIAGNOSIS — E785 Hyperlipidemia, unspecified: Secondary | ICD-10-CM

## 2014-03-30 DIAGNOSIS — M79605 Pain in left leg: Secondary | ICD-10-CM

## 2014-03-30 DIAGNOSIS — M4726 Other spondylosis with radiculopathy, lumbar region: Secondary | ICD-10-CM

## 2014-03-30 LAB — POCT URINALYSIS DIPSTICK
Bilirubin, UA: NEGATIVE
Blood, UA: NEGATIVE
Glucose, UA: NEGATIVE
Ketones, UA: NEGATIVE
Nitrite, UA: NEGATIVE
Protein, UA: NEGATIVE
Spec Grav, UA: 1.025
Urobilinogen, UA: 0.2
pH, UA: 5

## 2014-03-30 LAB — GLUCOSE, POCT (MANUAL RESULT ENTRY): POC Glucose: 129 mg/dl — AB (ref 70–99)

## 2014-03-30 MED ORDER — HYDROCHLOROTHIAZIDE 25 MG PO TABS: 25.0000 mg | ORAL_TABLET | Freq: Every day | ORAL | Status: DC

## 2014-03-30 MED ORDER — METHOCARBAMOL 500 MG PO TABS: 500.0000 mg | ORAL_TABLET | Freq: Every evening | ORAL | Status: DC | PRN

## 2014-03-30 MED ORDER — ATORVASTATIN CALCIUM 40 MG PO TABS: 40.0000 mg | ORAL_TABLET | Freq: Every day | ORAL | Status: DC

## 2014-03-30 MED ORDER — TRAMADOL HCL 50 MG PO TABS: 25.0000 mg | ORAL_TABLET | Freq: Three times a day (TID) | ORAL | Status: DC | PRN

## 2014-03-30 MED ORDER — ZOSTER VACCINE LIVE 19400 UNT/0.65ML ~~LOC~~ SOLR: 0.6500 mL | Freq: Once | SUBCUTANEOUS | Status: DC

## 2014-03-30 MED ORDER — METFORMIN HCL 500 MG PO TABS: 500.0000 mg | ORAL_TABLET | Freq: Two times a day (BID) | ORAL | Status: DC

## 2014-03-30 MED ORDER — LISINOPRIL-HYDROCHLOROTHIAZIDE 20-25 MG PO TABS: 1.0000 | ORAL_TABLET | Freq: Every day | ORAL | Status: DC

## 2014-03-30 MED ORDER — GABAPENTIN 300 MG PO CAPS: ORAL_CAPSULE | ORAL | Status: DC

## 2014-03-30 NOTE — Assessment & Plan Note (Signed)
A: well controlled P: Continue current regimen  

## 2014-03-30 NOTE — Progress Notes (Signed)
   Subjective:    Patient ID: Rachel Herman, female    DOB: 21-Jul-1950, 64 y.o.   MRN: [id number] CC: DM2 f/u, chronic low back pain  HPI 64 yo F:  1. DM2: taking metformin BID. No low sugars. Weight stable.   2. Chronic low back pain: radiates to both hips and down L leg. Worse when lying flat or standing in one place. No falls. No fecal or urinary incontinence.   Soc Hx: non smoker  Review of Systems As per HPI      Objective:   Physical Exam BP 125/85 mmHg  Pulse 76  Temp(Src) 98 F (36.7 C)  Resp 16  Ht 5\' 4"  (1.626 m)  Wt 244 lb (110.678 kg)  BMI 41.86 kg/m2  SpO2 97%  Wt Readings from Last 3 Encounters:  03/30/14 244 lb (110.678 kg)  03/09/14 242 lb 9.6 oz (110.043 kg)  03/02/14 244 lb (110.678 kg)  General appearance: alert, cooperative and no distress Back: b/l lateral hips tenderness, normal gait.  CBG 129 Lab Results  Component Value Date   HGBA1C 6.6 01/17/2014         Assessment & Plan:

## 2014-03-30 NOTE — Patient Instructions (Signed)
Ms. Gladue,  Thank you for coming in today.  Normal BP and blood sugar. Restart Lipitor, sent in refills.  For hip and back pain: Increase gabapentin to 300 mg in the morning, 600 mg at night Add robaxin, muscle relaxer nightly.  Refilled tramadol. Continue to be active  F/u in 2 months for A1c   Dr. Adrian Blackwater

## 2014-03-30 NOTE — Assessment & Plan Note (Signed)
For hip and back pain: Increase gabapentin to 300 mg in the morning, 600 mg at night Add robaxin, muscle relaxer nightly.  Refilled tramadol. Continue to be active

## 2014-03-30 NOTE — Progress Notes (Signed)
Patient here to check in about her DM Patient did not bring meter but states her sugars range between 80-130 Patient quit taking Effexor patient states she felt worse taking it.

## 2014-03-31 NOTE — Addendum Note (Signed)
Encounter addended by: Jodelle Gross, MD on: 03/31/2014  8:32 PM<BR>     Documentation filed: Clinical Notes

## 2014-04-01 DIAGNOSIS — Z510 Encounter for antineoplastic radiation therapy: Secondary | ICD-10-CM | POA: Diagnosis not present

## 2014-04-04 ENCOUNTER — Ambulatory Visit
Admission: RE | Admit: 2014-04-04 | Discharge: 2014-04-04 | Disposition: A | Discharge status: Home / Self Care | Payer: Medicaid Other | Source: Ambulatory Visit | Attending: Radiation Oncology | Admitting: Radiation Oncology

## 2014-04-04 ENCOUNTER — Ambulatory Visit: Payer: Medicaid Other

## 2014-04-04 DIAGNOSIS — Z510 Encounter for antineoplastic radiation therapy: Secondary | ICD-10-CM | POA: Diagnosis present

## 2014-04-04 DIAGNOSIS — C50219 Malignant neoplasm of upper-inner quadrant of unspecified female breast: Secondary | ICD-10-CM | POA: Insufficient documentation

## 2014-04-04 MED ORDER — ALRA NON-METALLIC DEODORANT (RAD-ONC)
1.0000 "application " | Freq: Once | TOPICAL | Status: AC
  Administered 2014-04-04: 1 via TOPICAL

## 2014-04-04 MED ORDER — RADIAPLEXRX EX GEL
Freq: Once | CUTANEOUS | Status: AC
  Administered 2014-04-04: 16:00:00 via TOPICAL

## 2014-04-04 NOTE — Progress Notes (Signed)
Patient education done, breast,: radiation therapy and you book, alra deodorant, radiaplex gel, my business card, flyer on skin products, discussed ways to manage symptoms, increase protein in diet, drink plenty fluids, skin irritation, fatigue,pain, use radiaplex gel once skin becomes irritated,or sun burn looking, daily after rad tx and in am after shower, nothing before 4 hours of rad txs, alra after rad tx and prn, verbal understandding, teach back given

## 2014-04-05 ENCOUNTER — Ambulatory Visit
Admission: RE | Admit: 2014-04-05 | Discharge: 2014-04-05 | Disposition: A | Discharge status: Home / Self Care | Payer: Medicaid Other | Source: Ambulatory Visit | Attending: Radiation Oncology | Admitting: Radiation Oncology

## 2014-04-05 ENCOUNTER — Ambulatory Visit: Payer: Medicaid Other

## 2014-04-05 DIAGNOSIS — Z510 Encounter for antineoplastic radiation therapy: Secondary | ICD-10-CM | POA: Diagnosis not present

## 2014-04-06 ENCOUNTER — Ambulatory Visit: Payer: Medicaid Other

## 2014-04-06 ENCOUNTER — Ambulatory Visit
Admission: RE | Admit: 2014-04-06 | Discharge: 2014-04-06 | Disposition: A | Discharge status: Home / Self Care | Payer: Medicaid Other | Source: Ambulatory Visit | Attending: Radiation Oncology | Admitting: Radiation Oncology

## 2014-04-06 DIAGNOSIS — Z510 Encounter for antineoplastic radiation therapy: Secondary | ICD-10-CM | POA: Diagnosis not present

## 2014-04-07 ENCOUNTER — Ambulatory Visit
Admission: RE | Admit: 2014-04-07 | Discharge: 2014-04-07 | Disposition: A | Discharge status: Home / Self Care | Payer: Medicaid Other | Source: Ambulatory Visit | Attending: Radiation Oncology | Admitting: Radiation Oncology

## 2014-04-07 ENCOUNTER — Ambulatory Visit: Payer: Medicaid Other

## 2014-04-07 DIAGNOSIS — Z510 Encounter for antineoplastic radiation therapy: Secondary | ICD-10-CM | POA: Diagnosis not present

## 2014-04-08 ENCOUNTER — Ambulatory Visit: Payer: Medicaid Other

## 2014-04-08 ENCOUNTER — Ambulatory Visit
Admission: RE | Admit: 2014-04-08 | Discharge: 2014-04-08 | Disposition: A | Discharge status: Home / Self Care | Payer: Medicaid Other | Source: Ambulatory Visit | Attending: Radiation Oncology | Admitting: Radiation Oncology

## 2014-04-08 ENCOUNTER — Encounter: Payer: Self-pay | Admitting: Radiation Oncology

## 2014-04-08 VITALS — BP 119/77 | HR 100 | Temp 97.7°F | Resp 20 | Wt 241.7 lb

## 2014-04-08 DIAGNOSIS — Z510 Encounter for antineoplastic radiation therapy: Secondary | ICD-10-CM | POA: Diagnosis not present

## 2014-04-08 DIAGNOSIS — C50219 Malignant neoplasm of upper-inner quadrant of unspecified female breast: Secondary | ICD-10-CM

## 2014-04-08 NOTE — Progress Notes (Signed)
Weekly rad txs 4  Completed left breast , no skin changes, using radiaplex bid no pain 8:19 AM

## 2014-04-08 NOTE — Progress Notes (Signed)
Department of Radiation Oncology  Phone:  [phone] Fax:        [phone]  Weekly Treatment Note    Name: AISLYN HAYSE Date: 04/08/2014 MRN: [id number] DOB: 03-May-1950   Current dose: 7.2 Gy  Current fraction:4   MEDICATIONS: Current Outpatient Prescriptions  Medication Sig Dispense Refill  . ACCU-CHEK FASTCLIX LANCETS MISC 1 Units by Percutaneous route 4 (four) times daily. 100 each 12  . acetaminophen (TYLENOL) 500 MG tablet Take 500 mg by mouth every 6 (six) hours as needed.    . Ascorbic Acid (VITAMIN C) 1000 MG tablet Take 1,000 mg by mouth daily.    Marland Kitchen aspirin 325 MG tablet Take 325 mg by mouth once.    Marland Kitchen atorvastatin (LIPITOR) 40 MG tablet Take 1 tablet (40 mg total) by mouth daily. 90 tablet 3  . Blood Glucose Monitoring Suppl (ACCU-CHEK NANO SMARTVIEW) W/DEVICE KIT 1 kit by Subdermal route as directed. Check blood sugars for fasting, and two hours after breakfast, lunch and dinner (4 checks daily) 1 kit 0  . Docusate Sodium (DSS) 100 MG CAPS Take 100 mg by mouth 2 (two) times daily. 30 each 3  . gabapentin (NEURONTIN) 300 MG capsule 300 mg during the day, 600 mg at night by mouth 270 capsule 1  . glucose blood (ACCU-CHEK SMARTVIEW) test strip Use as instructed to check blood sugars 100 each 12  . hyaluronate sodium (RADIAPLEXRX) GEL Apply 1 application topically 2 (two) times daily. Apply to affected rad tx area 2x day,after rad tx  and bedtime or in am ,just not 4 hours prior to treatment daily    . hydrochlorothiazide (HYDRODIURIL) 25 MG tablet Take 1 tablet (25 mg total) by mouth daily. 90 tablet 3  . lisinopril-hydrochlorothiazide (PRINZIDE,ZESTORETIC) 20-25 MG per tablet Take 1 tablet by mouth daily. 90 tablet 3  . metFORMIN (GLUCOPHAGE) 500 MG tablet Take 1 tablet (500 mg total) by mouth 2 (two) times daily. 180 tablet 3  . methocarbamol (ROBAXIN) 500 MG tablet Take 1 tablet (500 mg total) by mouth at bedtime as needed for muscle spasms. 30 tablet 1  .  Multiple Vitamins-Minerals (MULTIVITAMIN WITH MINERALS) tablet Take 1 tablet by mouth daily.    . non-metallic deodorant Jethro Poling) MISC Apply 1 application topically daily as needed.    . traMADol (ULTRAM) 50 MG tablet Take 0.5-1 tablets (25-50 mg total) by mouth every 8 (eight) hours as needed. 90 tablet 0  . vitamin D, CHOLECALCIFEROL, 400 UNITS tablet Take 400 Units by mouth daily.    Marland Kitchen zoster vaccine live, PF, (ZOSTAVAX) 74259 UNT/0.65ML injection Inject 19,400 Units into the skin once. (Patient not taking: Reported on 04/08/2014) 1 each 0   No current facility-administered medications for this encounter.     ALLERGIES: Review of patient's allergies indicates no known allergies.   LABORATORY DATA:  Lab Results  Component Value Date   WBC 9.9 02/07/2014   HGB 12.3 03/02/2014   HCT 40.4 02/07/2014   MCV 85.4 02/07/2014   PLT 331 02/07/2014   Lab Results  Component Value Date   NA 143 02/07/2014   K 4.1 02/07/2014   CL 100 12/27/2013   CO2 30* 02/07/2014   Lab Results  Component Value Date   ALT 49 02/07/2014   AST 43* 02/07/2014   ALKPHOS 66 02/07/2014   BILITOT 0.59 02/07/2014     NARRATIVE: [name] was seen today for weekly treatment management. The chart was checked and the patient's films were reviewed.  Weekly rad txs 4  Completed left breast , no skin changes, using radiaplex bid no pain   PHYSICAL EXAMINATION: weight is 241 lb 11.2 oz (109.634 kg). Her oral temperature is 97.7 F (36.5 C). Her blood pressure is 119/77 and her pulse is 100. Her respiration is 20.        ASSESSMENT: The patient is doing satisfactorily with treatment.  PLAN: We will continue with the patient's radiation treatment as planned.

## 2014-04-11 ENCOUNTER — Ambulatory Visit (HOSPITAL_BASED_OUTPATIENT_CLINIC_OR_DEPARTMENT_OTHER): Payer: Medicaid Other | Admitting: Genetic Counselor

## 2014-04-11 ENCOUNTER — Other Ambulatory Visit: Payer: Medicaid Other

## 2014-04-11 ENCOUNTER — Other Ambulatory Visit (HOSPITAL_BASED_OUTPATIENT_CLINIC_OR_DEPARTMENT_OTHER): Payer: Medicaid Other

## 2014-04-11 ENCOUNTER — Encounter: Payer: Self-pay | Admitting: Hematology

## 2014-04-11 ENCOUNTER — Ambulatory Visit (HOSPITAL_BASED_OUTPATIENT_CLINIC_OR_DEPARTMENT_OTHER): Payer: Medicaid Other | Admitting: Hematology

## 2014-04-11 ENCOUNTER — Telehealth: Payer: Self-pay | Admitting: Hematology

## 2014-04-11 ENCOUNTER — Encounter: Payer: Self-pay | Admitting: Genetic Counselor

## 2014-04-11 ENCOUNTER — Ambulatory Visit: Payer: Medicaid Other

## 2014-04-11 ENCOUNTER — Ambulatory Visit
Admission: RE | Admit: 2014-04-11 | Discharge: 2014-04-11 | Disposition: A | Discharge status: Home / Self Care | Payer: Medicaid Other | Source: Ambulatory Visit | Attending: Radiation Oncology | Admitting: Radiation Oncology

## 2014-04-11 VITALS — BP 126/76 | HR 82 | Temp 97.8°F | Resp 20 | Ht 64.0 in | Wt 240.1 lb

## 2014-04-11 DIAGNOSIS — Z170 Estrogen receptor positive status [ER+]: Secondary | ICD-10-CM

## 2014-04-11 DIAGNOSIS — M8580 Other specified disorders of bone density and structure, unspecified site: Secondary | ICD-10-CM

## 2014-04-11 DIAGNOSIS — C50212 Malignant neoplasm of upper-inner quadrant of left female breast: Secondary | ICD-10-CM

## 2014-04-11 DIAGNOSIS — C50912 Malignant neoplasm of unspecified site of left female breast: Secondary | ICD-10-CM

## 2014-04-11 DIAGNOSIS — Z510 Encounter for antineoplastic radiation therapy: Secondary | ICD-10-CM | POA: Diagnosis not present

## 2014-04-11 DIAGNOSIS — Z8042 Family history of malignant neoplasm of prostate: Secondary | ICD-10-CM

## 2014-04-11 LAB — COMPREHENSIVE METABOLIC PANEL (CC13)
ALT: 51 U/L (ref 0–55)
AST: 48 U/L — ABNORMAL HIGH (ref 5–34)
Albumin: 3.7 g/dL (ref 3.5–5.0)
Alkaline Phosphatase: 58 U/L (ref 40–150)
Anion Gap: 12 mEq/L — ABNORMAL HIGH (ref 3–11)
BUN: 25.8 mg/dL (ref 7.0–26.0)
CO2: 28 mEq/L (ref 22–29)
Calcium: 9.7 mg/dL (ref 8.4–10.4)
Chloride: 100 mEq/L (ref 98–109)
Creatinine: 1.1 mg/dL (ref 0.6–1.1)
EGFR: 52 mL/min/{1.73_m2} — ABNORMAL LOW (ref >90)
Glucose: 156 mg/dl — ABNORMAL HIGH (ref 70–140)
Potassium: 4.1 mEq/L (ref 3.5–5.1)
Sodium: 140 mEq/L (ref 136–145)
Total Bilirubin: 0.41 mg/dL (ref 0.20–1.20)
Total Protein: 7.4 g/dL (ref 6.4–8.3)

## 2014-04-11 LAB — CBC WITH DIFFERENTIAL/PLATELET
BASO%: 0.3 % (ref 0.0–2.0)
Basophils Absolute: 0 10*3/uL (ref 0.0–0.1)
EOS%: 1.6 % (ref 0.0–7.0)
Eosinophils Absolute: 0.2 10*3/uL (ref 0.0–0.5)
HCT: 39.8 % (ref 34.8–46.6)
HGB: 12.7 g/dL (ref 11.6–15.9)
LYMPH%: 25.4 % (ref 14.0–49.7)
MCH: 28 pg (ref 25.1–34.0)
MCHC: 31.9 g/dL (ref 31.5–36.0)
MCV: 87.7 fL (ref 79.5–101.0)
MONO#: 0.6 10*3/uL (ref 0.1–0.9)
MONO%: 6.1 % (ref 0.0–14.0)
NEUT#: 6.2 10*3/uL (ref 1.5–6.5)
NEUT%: 66.6 % (ref 38.4–76.8)
Platelets: 297 10*3/uL (ref 145–400)
RBC: 4.54 10*6/uL (ref 3.70–5.45)
RDW: 14.8 % — ABNORMAL HIGH (ref 11.2–14.5)
WBC: 9.3 10*3/uL (ref 3.9–10.3)
lymph#: 2.4 10*3/uL (ref 0.9–3.3)

## 2014-04-11 NOTE — Progress Notes (Signed)
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 Ultrasound of the left axilla is negative.      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    12/29/2013 Surgery Left lumpectomy with close (<0.1cm) inferior margin   03/02/2014 Surgery Reexcision for close margin, path negative for malignancy.   04/05/2014 -  Radiation Therapy she completed on     INTERIM HISTORY: She returns for follow up. She has started radiation last week, and she is tolerating well. She still has moderate low back pain, and she is taking tylenol and tramadol for the pain. No leg weakness or difficulty walking. No other new complains.    MEDICAL HISTORY:  Past Medical History  Diagnosis Date  . Obesity, morbid, BMI 40.0-49.9   . Hypertension 2014  . Hyperlipidemia   . PONV (postoperative nausea and vomiting)   . Breast cancer 12/09/13    left breast Invasive  Ductal Carcinoma  . Diabetes mellitus 09/16/13    Diagnosed on 09/16/13; HgA1C was 7.2    SURGICAL HISTORY: Past Surgical History  Procedure Laterality Date  . Abdominal hysterectomy N/A 09/20/2013    Procedure: HYSTERECTOMY ABDOMINAL;  Surgeon: Osborne Oman, MD;  Location: [facility];  Service: Gynecology;  Laterality: N/A;  . Salpingoophorectomy  09/20/2013    Procedure: SALPINGO OOPHORECTOMY;  Surgeon: Osborne Oman, MD;  Location: [facility];  Service: Gynecology;;  . Laparoscopic assisted vaginal hysterectomy  09/20/2013    Procedure: LAPAROSCOPIC ASSISTED VAGINAL HYSTERECTOMY;  Surgeon: Osborne Oman, MD;  Location: [facility];  Service: Gynecology;;  PT WAS EXAMINED WHILE UP IN [place] AND IT WAS DECIDED TO OPEN PT DUE TO LARGE MASS  . Breast lumpectomy with axillary lymph node biopsy Left 12/29/13    left breast   . Radioactive seed guided mastectomy with axillary sentinel lymph node biopsy Left 12/29/2013    Procedure: LEFT BREAST RADIOACTIVE SEED LOCALIZED LUMPECTOMY WITH SENTINEL LYMPH NODE MAPPING;  Surgeon: Erroll Luna, MD;  Location: [facility];  Service: General;  Laterality: Left;  . Re-excision of breast lumpectomy Left 03/02/2014    Procedure: RE-EXCISION OF LEFT BREAST LUMPECTOMY;  Surgeon: Erroll Luna, MD;  Location: [facility];  Service: General;  Laterality: Left;    SOCIAL HISTORY: History   Social History  . Marital Status: Married    Spouse Name: Anida Deol   . Number of Children: 3  .  Years of Education: 12   Occupational History  .  Unemployed   Social History Main Topics  . Smoking status: Never Smoker   . Smokeless tobacco: Never Used  . Alcohol Use: No  . Drug Use: No  . Sexual Activity: Not Currently    Birth Control/ Protection: Post-menopausal   Other Topics Concern  . Not on file   Social History Narrative   Married to [employer] in 1986.    Has 3 children from previous marriage, 1 in [place], 1 in  [place], 1 in prison ([place]).    Lives with husband.           FAMILY HISTORY: Family History  Problem Relation Age of Onset  . Diabetes Mother   . Cancer Mother 62    multiple myeloma   . Diabetes Sister   . Diabetes Brother   . Cancer Brother 40    prostate cancer   . Diabetes Son   . Diabetes Maternal Aunt   . Cancer Paternal Grandmother 53    stomach  . Breast cancer Sister     ALLERGIES:  has No Known Allergies.  MEDICATIONS:  Current Outpatient Prescriptions  Medication Sig Dispense Refill  . ACCU-CHEK FASTCLIX LANCETS MISC 1 Units by Percutaneous route 4 (four) times daily. 100 each 12  . acetaminophen (TYLENOL) 500 MG tablet Take 500 mg by mouth every 6 (six) hours as needed.    . Ascorbic Acid (VITAMIN C) 1000 MG tablet Take 1,000 mg by mouth daily.    Marland Kitchen aspirin 325 MG tablet Take 325 mg by mouth once.    Marland Kitchen atorvastatin (LIPITOR) 40 MG tablet Take 1 tablet (40 mg total) by mouth daily. 90 tablet 3  . Blood Glucose Monitoring Suppl (ACCU-CHEK NANO SMARTVIEW) W/DEVICE KIT 1 kit by Subdermal route as directed. Check blood sugars for fasting, and two hours after breakfast, lunch and dinner (4 checks daily) 1 kit 0  . Docusate Sodium (DSS) 100 MG CAPS Take 100 mg by mouth 2 (two) times daily. 30 each 3  . gabapentin (NEURONTIN) 300 MG capsule 300 mg during the day, 600 mg at night by mouth 270 capsule 1  . glucose blood (ACCU-CHEK SMARTVIEW) test strip Use as instructed to check blood sugars 100 each 12  . hyaluronate sodium (RADIAPLEXRX) GEL Apply 1 application topically 2 (two) times daily. Apply to affected rad tx area 2x day,after rad tx  and bedtime or in am ,just not 4 hours prior to treatment daily    . hydrochlorothiazide (HYDRODIURIL) 25 MG tablet Take 1 tablet (25 mg total) by mouth daily. 90 tablet 3  . lisinopril-hydrochlorothiazide (PRINZIDE,ZESTORETIC) 20-25 MG per tablet Take 1 tablet by mouth daily. 90 tablet 3  . metFORMIN (GLUCOPHAGE) 500 MG  tablet Take 1 tablet (500 mg total) by mouth 2 (two) times daily. 180 tablet 3  . methocarbamol (ROBAXIN) 500 MG tablet Take 1 tablet (500 mg total) by mouth at bedtime as needed for muscle spasms. 30 tablet 1  . Multiple Vitamins-Minerals (MULTIVITAMIN WITH MINERALS) tablet Take 1 tablet by mouth daily.    . non-metallic deodorant Jethro Poling) MISC Apply 1 application topically daily as needed.    . traMADol (ULTRAM) 50 MG tablet Take 0.5-1 tablets (25-50 mg total) by mouth every 8 (eight) hours as needed. 90 tablet 0  . vitamin D, CHOLECALCIFEROL, 400 UNITS tablet Take 400 Units by mouth daily.     No current facility-administered medications for this visit.    REVIEW OF  SYSTEMS:   Constitutional: Denies fevers, chills or abnormal night sweats, (+) 20 lbs weight loss since 8/15, low appetite  Eyes: (+) blurriness of vision, no double vision or watery eyes Ears, nose, mouth, throat, and face: Denies mucositis or sore throat Respiratory: (+) dyspnea on exertion, Denies cough or wheezes Cardiovascular: Denies palpitation, chest discomfort or lower extremity swelling Gastrointestinal:  Denies nausea, heartburn or change in bowel habits Skin: Denies abnormal skin rashes Lymphatics: Denies new lymphadenopathy or easy bruising Neurological:Denies numbness, tingling or new weaknesses Behavioral/Psych: Mood is stable, no new changes  All other systems were reviewed with the patient and are negative.  PHYSICAL EXAMINATION: ECOG PERFORMANCE STATUS: 1 - Symptomatic but completely ambulatory  Filed Vitals:   04/11/14 0928  BP: 126/76  Pulse: 82  Temp: 97.8 F (36.6 C)  Resp: 20   Filed Weights   04/11/14 0928  Weight: 240 lb 1.6 oz (108.909 kg)    GENERAL:alert, no distress and comfortable SKIN: skin color, texture, turgor are normal, no rashes or significant lesions EYES: normal, conjunctiva are pink and non-injected, sclera clear OROPHARYNX:no exudate, no erythema and lips, buccal mucosa,  and tongue normal  NECK: supple, thyroid normal size, non-tender, without nodularity LYMPH:  no palpable lymphadenopathy in the cervical, axillary or inguinal LUNGS: clear to auscultation and percussion with normal breathing effort HEART: regular rate & rhythm and no murmurs and no lower extremity edema ABDOMEN:abdomen soft, non-tender and normal bowel sounds Musculoskeletal:no cyanosis of digits and no clubbing  PSYCH: alert & oriented x 3 with fluent speech NEURO: no focal motor/sensory deficits Breasts: Breast inspection showed them to be symmetrical with no nipple discharge. Surgical scar at the left upper breast and axillary area, healing well without surrounding erythema or discharge. Diffuse skin erythema in the left breast, likely secondary to radiation.  Palpation of the right breasts and axilla revealed no obvious mass that I could appreciate.   LABORATORY DATA:  I have reviewed the data as listed Lab Results  Component Value Date   WBC 9.3 04/11/2014   HGB 12.7 04/11/2014   HCT 39.8 04/11/2014   MCV 87.7 04/11/2014   PLT 297 04/11/2014    Recent Labs  09/20/13 0830 09/21/13 0531 12/27/13 1305 02/07/14 0917 04/11/14 0855  NA 135* 137 141 143 140  K 3.5* 3.9 3.7 4.1 4.1  CL 92* 94* 100  --   --   CO2 29 33* 27 30* 28  GLUCOSE 154* 145* 112* 133 156*  BUN _0 15.3 25.8  CREATININE 0.86 0.78 0.80 0.9 1.1  CALCIUM 9.2 7.8* 9.9 9.8 9.7  GFRNONAA 70* 87* 77*  --   --   GFRAA 82* >90 89*  --   --   PROT  --  5.8* 8.3 7.5 7.4  ALBUMIN  --  2.5* 3.9 3.7 3.7  AST  --  19 65* 43* 48*  ALT  --  21 58* 49 51  ALKPHOS  --  52 67 66 58  BILITOT  --  0.6 0.4 0.59 0.41   PATHOLOGY REPORT Diagnosis 12/29/2013 1. Breast, lumpectomy, left - INVASIVE GRADE II DUCTAL CARCINOMA, SPANNING 1.7 CM IN GREATEST DIMENSION. - SECOND FOCUS OF INVASIVE GRADE I DUCTAL CARCINOMA, SPANNING 0.5 CM IN GREATEST DIMENSION. - INTERMEDIATE GRADE DUCTAL CARCINOMA IN SITU ASSOCIATED WITH  BOTH FOCI OF TUMOR. - INVASIVE DUCTAL CARCINOMA IS EXTREMELY CLOSE (LESS THAN 0.1 CM) TO INFERIOR MARGIN. - DUCTAL CARCINOMA IN SITU IS CLOSE (0.1 CM) TO INFERIOR MARGIN. -  OTHER MARGINS NEGATIVE. - SEE ONCOLOGY TEMPLATE. 2. Lymph node, sentinel, biopsy, Left axillary 1 of 4 FINAL for TORREY, HORSEMAN ([id number]) Diagnosis(continued) - ONE BENIGN LYMPH NODE WITH NO TUMOR SEEN (0/1). 3. Lymph node, sentinel, biopsy, Left axilla - ONE BENIGN LYMPH NODE WITH NO TUMOR SEEN (0/1). Microscopic Comment 1. BREAST, INVASIVE TUMOR, WITH LYMPH NODES PRESENT Specimen, including laterality and lymph node sampling (sentinel, non-sentinel): Left partial breast with left sentinel lymph node sampling. Procedure: Left breast lumpectomy with sentinel lymph node biopsies. Histologic type: Invasive ductal carcinoma. Larger focus: Grade: II. Tubule formation: 2. Nuclear pleomorphism: 2. Mitotic: 3. Smaller focus (near inferior margin): Grade: I. Tubule formation: 1. Nuclear pleomorphism: 2. Mitotic: 1. Tumor sizes (gross and glass slide measurement): 1.7 cm and 0.5 cm. Margins: Invasive, distance to closest margin: less than 0.1 cm (inferior margin). In-situ, distance to closest margin: 0.1 cm (inferior margin). Lymphovascular invasion: Definitive lymphovascular invasion is not identified. Ductal carcinoma in situ: Yes. Grade: Intermediate grade. Extensive intraductal component: There is an extensive intraductal component of ductal carcinoma in situ on the smaller focus but not on the larger focus. Lobular neoplasia: No. Tumor focality: Two foci, multifocal. Treatment effect: N/A. Extent of tumor: Tumor confined to breast parenchyma. Skin: Not received. Nipple: Not received. Skeletal muscle: Not received. Lymph nodes: Examined: 2 Sentinel. 0 Non-sentinel. 2 Total. Lymph nodes with metastasis: 0. Breast prognostic profile: Performed on previous case ([id number]). Estrogen  receptor: 100%, positive. Progesterone receptor: 96%, positive. Her 2 neu by CISH: 0.81 ratio, no amplification. Ki-67: 73%. Non-neoplastic breast: Fat necrosis. TNM: pT1c(m), pN0, MX. Comments: A smooth muscle myosin, calponin, and p63 immunohistochemical stain are performed on a single block, which helps to confirm the presence of a second focus of invasive ductal carcinoma associated 2 of 4 FINAL for MAKAIA, RAPPA ([id number]) Microscopic Comment(continued) with ductal carcinoma in situ. As Her-2 neu by CISH was negative on the initial biopsy, this will be repeated on the larger tumor focus and reported in an addendum to follow. A breast prognostic profile will not be performed on the smaller tumor focus unless otherwise requested (the tumor foci although different grades show morphologically similar nuclear features). (RH:ds 12/31/13)  Oncotype DX Score: 23 (Intermediate risk) (ER+/PR+/HER2-)  RADIOGRAPHIC STUDIES: I have personally reviewed the radiological images as listed and agreed with the findings in the report.   MM digital Diagnostic and [place] 11/23/13 Ultrasound is performed, showing angulated hypoechoic mass at the left breast 10 o'clock 18 cm from nipple measuring 1.82 x 0.71 x 0.88 cm. Ultrasound of the left axilla is negative.   ASSESSMENT & PLAN:  65 year old Caucasian female, with past medical history of hypertension, diabetes, dyslipidemia, obesity who was found to have a left breast mass by screening mammogram.  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 Grade 2, Ki-67 73%, status post lumpectomy with close margin. -She is status post complete surgical resection with lumpectomy. -Her breast cancer is likely cured by surgery alone, but does has some risk of disease recurrence in the future. -I discussed the Oncotype DX result with her and her husband. The recurrence score is 23, with the predicted  10 year recurrence risk of 15% with tamoxifen alone. The benefit of chemotherapy is uncertain in this intermediate risk group. I do not strongly recommend adjuvant chemotherapy. Patient agree with not having chemotherapy.  -Due to the strong positivity of ER and PR, I recommend adjuvant hormonal therapy to reduce the risk  of cancer recurrence in the future, which includes local and distant recurrence. Given her post menopause status, I recommend aromatase inhibitor, such as Aromasin, for total 5 years. I'll start after she completes adjuvant radiation.  #2 hypertension, diabetes, dyslipidemia, chronic low back pain -She will continue follow-up with her primary care physician.  #3 Morbid obesity -We discussed healthy diet and regular exercise. She is willing to try. -I suggest you take calcium and vitamin D for bone health. Before her start adjuvant Arimidex, I will obtain a bone density scan as a baseline.  PLAN: -She is going to complete radiation in mid April. Return in early May to start Aromasin -Bone density scan before next visit  All questions were answered. The patient knows to call the clinic with any problems, questions or concerns. I spent 20 minutes counseling the patient face to face. The total time spent in the appointment was 25 minutes and more than 50% was on counseling.     Truitt Merle, MD 04/11/2014 10:03 AM

## 2014-04-11 NOTE — Progress Notes (Signed)
REFERRING PROVIDER: Boykin Nearing, MD [place], [place] [address]   Truitt Merle, MD  PRIMARY PROVIDER:  Minerva Ends, MD  PRIMARY REASON FOR VISIT:  1. Breast cancer, left   2. Family history of prostate cancer      HISTORY OF PRESENT ILLNESS:   Rachel Herman, a 64 y.o. female, was seen for a [place] cancer genetics consultation at the request of Dr. Burr Medico due to a personal and family history of cancer.  Rachel Herman presents to clinic today to discuss the possibility of a hereditary predisposition to cancer, genetic testing, and to further clarify her future cancer risks, as well as potential cancer risks for family members.   In 2015, at the age of 15, Rachel Herman was diagnosed with invasive ductal carcinoma of the left breast.  The tumor was ER+/PR+/Her2-. This was treated with lumpectomy and she is currently undergoing radiation.     CANCER HISTORY:  Oncology History   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 Ultrasound of the left axilla is negative.      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    12/29/2013 Surgery Left lumpectomy with close (<0.1cm) inferior margin   03/02/2014 Surgery Reexcision for close margin, path negative for malignancy.   04/05/2014 -  Radiation Therapy she completed on      HORMONAL RISK FACTORS:  Menarche was at age 29.  First live birth at age 62.  OCP use for approximately 3 years.  Ovaries intact: no.  Hysterectomy: yes.  Menopausal status: postmenopausal.  HRT use: 0  years. Colonoscopy: no; not examined. Mammogram within the last year: yes. Number of breast biopsies: 1. Up to date with pelvic exams:  yes. Any excessive radiation exposure in the past:  no  Past Medical History  Diagnosis Date  . Obesity, morbid, BMI 40.0-49.9   . Hypertension 2014  . Hyperlipidemia   . PONV (postoperative nausea and vomiting)   . Breast cancer 12/09/13    left breast Invasive Ductal Carcinoma  . Diabetes mellitus 09/16/13    Diagnosed on 09/16/13; HgA1C was 7.2    Past Surgical History  Procedure Laterality Date  . Abdominal hysterectomy N/A 09/20/2013    Procedure: HYSTERECTOMY ABDOMINAL;  Surgeon: Osborne Oman, MD;  Location: [facility];  Service: Gynecology;  Laterality: N/A;  . Salpingoophorectomy  09/20/2013    Procedure: SALPINGO OOPHORECTOMY;  Surgeon: Osborne Oman, MD;  Location: [facility];  Service: Gynecology;;  . Laparoscopic assisted vaginal hysterectomy  09/20/2013    Procedure: LAPAROSCOPIC ASSISTED VAGINAL HYSTERECTOMY;  Surgeon: Osborne Oman, MD;  Location: [facility];  Service: Gynecology;;  PT WAS EXAMINED WHILE UP IN [place] AND IT WAS DECIDED TO OPEN PT DUE TO LARGE MASS  . Breast lumpectomy with axillary lymph node biopsy Left 12/29/13    left breast   . Radioactive seed guided mastectomy with axillary sentinel lymph node biopsy Left 12/29/2013    Procedure: LEFT BREAST RADIOACTIVE SEED LOCALIZED LUMPECTOMY WITH SENTINEL LYMPH NODE MAPPING;  Surgeon: Erroll Luna, MD;  Location: [facility];  Service: General;  Laterality: Left;  . Re-excision of breast lumpectomy Left 03/02/2014    Procedure: RE-EXCISION OF LEFT BREAST LUMPECTOMY;  Surgeon: Erroll Luna, MD;  Location: [facility];  Service: General;  Laterality: Left;    History   Social History  . Marital Status: Married    Spouse Name: Tammy Wickliffe   . Number of Children: 3  . Years of Education: 12   Occupational History  .  Unemployed   Social  History Main Topics  . Smoking status: Never Smoker   . Smokeless tobacco: Never Used  . Alcohol Use: No  . Drug Use: No  . Sexual Activity: Not Currently    Birth Control/ Protection: Post-menopausal   Other Topics Concern  . None   Social History Narrative   Married to [employer] in 1986.    Has 3 children from previous marriage, 1 in [place], 1 in [place], 1 in prison ([place]).    Lives with husband.            FAMILY HISTORY:  We obtained a detailed, 4-generation family history.  Significant diagnoses are listed below: Family History  Problem Relation Age of Onset  . Diabetes Mother   . Multiple myeloma Mother 45  . Diabetes Sister   . Diabetes Brother   . Cancer Brother 40    prostate cancer   . Diabetes Son   . Diabetes Maternal Aunt   . Leukemia Maternal Aunt     dx in her 77s  . Alcoholism Brother    The patient's brother was diagnosed with prostate cancer, and her mother was diagnsoed with multiple myeloma a couple months before she died.  Rachel Herman maternal aunt was diagnosed with leukemia in her 70s.  Rachel Herman had reported a previous history of a sister with breast cancer and grandmother with stomach cancer, however, these individuals were step sister/step grandparents and not blood related to her.  Patient's maternal ancestors are of Native American and Caucasian descent, and paternal ancestors are of Caucasian descent. There is no reported Ashkenazi Jewish ancestry. There is no known consanguinity.  GENETIC COUNSELING ASSESSMENT: Rachel Herman is a 64 y.o. female with a personal and family history of cancer which somewhat suggestive of a sporadic history. We, therefore, discussed and recommended the following at today's visit.   DISCUSSION: We discussed with Rachel Herman that the family history is not highly consistent with a familial hereditary cancer syndrome, and we feel she is at low risk to harbor a gene mutation associated with  such a condition. Thus, we did not recommend any genetic testing, at this time, and recommended Rachel Herman continue to follow the cancer screening guidelines given by her primary healthcare provider.  PLAN: We encouraged Rachel Herman to remain in contact with cancer genetics annually so that we can continuously update the family history and inform her of any changes in cancer genetics and testing that may be of benefit for this family.   Ms.  Herman questions were answered to her satisfaction today. Our contact information was provided should additional questions or concerns arise. Thank you for the referral and allowing [place] to share in the care of your patient.   Karen P. Florene Glen, [place], [facility] Certified Genetic Counselor [email or web address] phone: [phone]  The patient was seen for a total of 60 minutes in face-to-face genetic counseling.  This patient was discussed with Drs. Magrinat, Lindi Adie and/or [employer]  who agrees with the above.    _______________________________________________________________________ For Office Staff:  Number of people involved in session: 1 Was an Intern/ student involved with case: yes

## 2014-04-11 NOTE — Telephone Encounter (Signed)
gv adn printed appt sched and avs for pt for May °

## 2014-04-12 ENCOUNTER — Ambulatory Visit
Admission: RE | Admit: 2014-04-12 | Discharge: 2014-04-12 | Disposition: A | Discharge status: Home / Self Care | Payer: Medicaid Other | Source: Ambulatory Visit | Attending: Radiation Oncology | Admitting: Radiation Oncology

## 2014-04-12 ENCOUNTER — Ambulatory Visit: Payer: Medicaid Other

## 2014-04-12 DIAGNOSIS — Z510 Encounter for antineoplastic radiation therapy: Secondary | ICD-10-CM | POA: Diagnosis not present

## 2014-04-13 ENCOUNTER — Encounter: Payer: Self-pay | Admitting: Radiation Oncology

## 2014-04-13 ENCOUNTER — Ambulatory Visit
Admission: RE | Admit: 2014-04-13 | Discharge: 2014-04-13 | Disposition: A | Discharge status: Home / Self Care | Payer: Medicaid Other | Source: Ambulatory Visit | Attending: Radiation Oncology | Admitting: Radiation Oncology

## 2014-04-13 ENCOUNTER — Ambulatory Visit: Payer: Medicaid Other

## 2014-04-13 VITALS — BP 121/71 | HR 77 | Temp 97.8°F | Resp 20 | Wt 242.9 lb

## 2014-04-13 DIAGNOSIS — Z510 Encounter for antineoplastic radiation therapy: Secondary | ICD-10-CM | POA: Diagnosis not present

## 2014-04-13 DIAGNOSIS — C50212 Malignant neoplasm of upper-inner quadrant of left female breast: Secondary | ICD-10-CM

## 2014-04-13 NOTE — Progress Notes (Signed)
Department of Radiation Oncology  Phone:  [phone] Fax:        [phone]  Weekly Treatment Note    Name: Rachel Herman Date: 04/13/2014 MRN: [id number] DOB: 13-Aug-1950   Current dose: 12.6 Gy  Current fraction: 7   MEDICATIONS: Current Outpatient Prescriptions  Medication Sig Dispense Refill  . ACCU-CHEK FASTCLIX LANCETS MISC 1 Units by Percutaneous route 4 (four) times daily. 100 each 12  . acetaminophen (TYLENOL) 500 MG tablet Take 500 mg by mouth every 6 (six) hours as needed.    . Ascorbic Acid (VITAMIN C) 1000 MG tablet Take 1,000 mg by mouth daily.    Marland Kitchen aspirin 325 MG tablet Take 325 mg by mouth once.    Marland Kitchen atorvastatin (LIPITOR) 40 MG tablet Take 1 tablet (40 mg total) by mouth daily. 90 tablet 3  . Blood Glucose Monitoring Suppl (ACCU-CHEK NANO SMARTVIEW) W/DEVICE KIT 1 kit by Subdermal route as directed. Check blood sugars for fasting, and two hours after breakfast, lunch and dinner (4 checks daily) 1 kit 0  . Docusate Sodium (DSS) 100 MG CAPS Take 100 mg by mouth 2 (two) times daily. 30 each 3  . gabapentin (NEURONTIN) 300 MG capsule 300 mg during the day, 600 mg at night by mouth 270 capsule 1  . glucose blood (ACCU-CHEK SMARTVIEW) test strip Use as instructed to check blood sugars 100 each 12  . hyaluronate sodium (RADIAPLEXRX) GEL Apply 1 application topically 2 (two) times daily. Apply to affected rad tx area 2x day,after rad tx  and bedtime or in am ,just not 4 hours prior to treatment daily    . hydrochlorothiazide (HYDRODIURIL) 25 MG tablet Take 1 tablet (25 mg total) by mouth daily. 90 tablet 3  . lisinopril-hydrochlorothiazide (PRINZIDE,ZESTORETIC) 20-25 MG per tablet Take 1 tablet by mouth daily. 90 tablet 3  . metFORMIN (GLUCOPHAGE) 500 MG tablet Take 1 tablet (500 mg total) by mouth 2 (two) times daily. 180 tablet 3  . methocarbamol (ROBAXIN) 500 MG tablet Take 1 tablet (500 mg total) by mouth at bedtime as needed for muscle spasms. 30 tablet 1  .  Multiple Vitamins-Minerals (MULTIVITAMIN WITH MINERALS) tablet Take 1 tablet by mouth daily.    . non-metallic deodorant Jethro Poling) MISC Apply 1 application topically daily as needed.    . traMADol (ULTRAM) 50 MG tablet Take 0.5-1 tablets (25-50 mg total) by mouth every 8 (eight) hours as needed. 90 tablet 0  . vitamin D, CHOLECALCIFEROL, 400 UNITS tablet Take 400 Units by mouth daily.     No current facility-administered medications for this encounter.     ALLERGIES: Review of patient's allergies indicates no known allergies.   LABORATORY DATA:  Lab Results  Component Value Date   WBC 9.3 04/11/2014   HGB 12.7 04/11/2014   HCT 39.8 04/11/2014   MCV 87.7 04/11/2014   PLT 297 04/11/2014   Lab Results  Component Value Date   NA 140 04/11/2014   K 4.1 04/11/2014   CL 100 12/27/2013   CO2 28 04/11/2014   Lab Results  Component Value Date   ALT 51 04/11/2014   AST 48* 04/11/2014   ALKPHOS 58 04/11/2014   BILITOT 0.41 04/11/2014     NARRATIVE: [name] was seen today for weekly treatment management. The chart was checked and the patient's films were reviewed.  Weekly rad txs 7/33 completed left breast, mild pink to skin changes, skin intact, no c/o pain using  radiaplex bid ,appetite poor but"I'm eating",  PHYSICAL EXAMINATION: weight is 242 lb 14.4 oz (110.179 kg). Her oral temperature is 97.8 F (36.6 C). Her blood pressure is 121/71 and her pulse is 77. Her respiration is 20.        ASSESSMENT: The patient is doing satisfactorily with treatment.  PLAN: We will continue with the patient's radiation treatment as planned.

## 2014-04-13 NOTE — Progress Notes (Signed)
Weekly rad txs 7/33 completed left breast, mild pink to skin changes, skin intact, no c/o pain using  radiaplex bid ,appetite poor but"I'm eating",  8:44 AM

## 2014-04-14 ENCOUNTER — Ambulatory Visit: Payer: Medicaid Other

## 2014-04-14 ENCOUNTER — Ambulatory Visit
Admission: RE | Admit: 2014-04-14 | Discharge: 2014-04-14 | Disposition: A | Discharge status: Home / Self Care | Payer: Medicaid Other | Source: Ambulatory Visit | Attending: Radiation Oncology | Admitting: Radiation Oncology

## 2014-04-14 DIAGNOSIS — Z510 Encounter for antineoplastic radiation therapy: Secondary | ICD-10-CM | POA: Diagnosis not present

## 2014-04-15 ENCOUNTER — Ambulatory Visit
Admission: RE | Admit: 2014-04-15 | Discharge: 2014-04-15 | Disposition: A | Discharge status: Home / Self Care | Payer: Medicaid Other | Source: Ambulatory Visit | Attending: Radiation Oncology | Admitting: Radiation Oncology

## 2014-04-15 ENCOUNTER — Ambulatory Visit: Payer: Medicaid Other

## 2014-04-15 DIAGNOSIS — Z510 Encounter for antineoplastic radiation therapy: Secondary | ICD-10-CM | POA: Diagnosis not present

## 2014-04-18 ENCOUNTER — Ambulatory Visit
Admission: RE | Admit: 2014-04-18 | Discharge: 2014-04-18 | Disposition: A | Discharge status: Home / Self Care | Payer: Medicaid Other | Source: Ambulatory Visit | Attending: Radiation Oncology | Admitting: Radiation Oncology

## 2014-04-18 ENCOUNTER — Ambulatory Visit: Payer: Medicaid Other

## 2014-04-18 DIAGNOSIS — Z510 Encounter for antineoplastic radiation therapy: Secondary | ICD-10-CM | POA: Diagnosis not present

## 2014-04-19 ENCOUNTER — Ambulatory Visit
Admission: RE | Admit: 2014-04-19 | Discharge: 2014-04-19 | Disposition: A | Discharge status: Home / Self Care | Payer: Medicaid Other | Source: Ambulatory Visit | Attending: Radiation Oncology | Admitting: Radiation Oncology

## 2014-04-19 ENCOUNTER — Ambulatory Visit: Payer: Medicaid Other

## 2014-04-19 DIAGNOSIS — Z510 Encounter for antineoplastic radiation therapy: Secondary | ICD-10-CM | POA: Diagnosis not present

## 2014-04-20 ENCOUNTER — Ambulatory Visit
Admission: RE | Admit: 2014-04-20 | Discharge: 2014-04-20 | Disposition: A | Discharge status: Home / Self Care | Payer: Medicaid Other | Source: Ambulatory Visit | Attending: Radiation Oncology | Admitting: Radiation Oncology

## 2014-04-20 ENCOUNTER — Ambulatory Visit: Payer: Medicaid Other

## 2014-04-20 DIAGNOSIS — C50212 Malignant neoplasm of upper-inner quadrant of left female breast: Secondary | ICD-10-CM

## 2014-04-20 DIAGNOSIS — Z510 Encounter for antineoplastic radiation therapy: Secondary | ICD-10-CM | POA: Diagnosis not present

## 2014-04-20 NOTE — Progress Notes (Signed)
Department of Radiation Oncology  Phone:  [phone] Fax:        [phone]  Weekly Treatment Note    Name: Rachel Herman Date: 04/20/2014 MRN: [id number] DOB: 08-Feb-1950   Current dose: 21.6 Gy  Current fraction: 12   MEDICATIONS: Current Outpatient Prescriptions  Medication Sig Dispense Refill  . ACCU-CHEK FASTCLIX LANCETS MISC 1 Units by Percutaneous route 4 (four) times daily. 100 each 12  . acetaminophen (TYLENOL) 500 MG tablet Take 500 mg by mouth every 6 (six) hours as needed.    . Ascorbic Acid (VITAMIN C) 1000 MG tablet Take 1,000 mg by mouth daily.    Marland Kitchen aspirin 325 MG tablet Take 325 mg by mouth once.    Marland Kitchen atorvastatin (LIPITOR) 40 MG tablet Take 1 tablet (40 mg total) by mouth daily. 90 tablet 3  . Blood Glucose Monitoring Suppl (ACCU-CHEK NANO SMARTVIEW) W/DEVICE KIT 1 kit by Subdermal route as directed. Check blood sugars for fasting, and two hours after breakfast, lunch and dinner (4 checks daily) 1 kit 0  . Docusate Sodium (DSS) 100 MG CAPS Take 100 mg by mouth 2 (two) times daily. 30 each 3  . gabapentin (NEURONTIN) 300 MG capsule 300 mg during the day, 600 mg at night by mouth 270 capsule 1  . glucose blood (ACCU-CHEK SMARTVIEW) test strip Use as instructed to check blood sugars 100 each 12  . hyaluronate sodium (RADIAPLEXRX) GEL Apply 1 application topically 2 (two) times daily. Apply to affected rad tx area 2x day,after rad tx  and bedtime or in am ,just not 4 hours prior to treatment daily    . hydrochlorothiazide (HYDRODIURIL) 25 MG tablet Take 1 tablet (25 mg total) by mouth daily. 90 tablet 3  . lisinopril-hydrochlorothiazide (PRINZIDE,ZESTORETIC) 20-25 MG per tablet Take 1 tablet by mouth daily. 90 tablet 3  . metFORMIN (GLUCOPHAGE) 500 MG tablet Take 1 tablet (500 mg total) by mouth 2 (two) times daily. 180 tablet 3  . methocarbamol (ROBAXIN) 500 MG tablet Take 1 tablet (500 mg total) by mouth at bedtime as needed for muscle spasms. 30 tablet 1    . Multiple Vitamins-Minerals (MULTIVITAMIN WITH MINERALS) tablet Take 1 tablet by mouth daily.    . non-metallic deodorant Jethro Poling) MISC Apply 1 application topically daily as needed.    . traMADol (ULTRAM) 50 MG tablet Take 0.5-1 tablets (25-50 mg total) by mouth every 8 (eight) hours as needed. 90 tablet 0  . vitamin D, CHOLECALCIFEROL, 400 UNITS tablet Take 400 Units by mouth daily.     No current facility-administered medications for this encounter.     ALLERGIES: Review of patient's allergies indicates no known allergies.   LABORATORY DATA:  Lab Results  Component Value Date   WBC 9.3 04/11/2014   HGB 12.7 04/11/2014   HCT 39.8 04/11/2014   MCV 87.7 04/11/2014   PLT 297 04/11/2014   Lab Results  Component Value Date   NA 140 04/11/2014   K 4.1 04/11/2014   CL 100 12/27/2013   CO2 28 04/11/2014   Lab Results  Component Value Date   ALT 51 04/11/2014   AST 48* 04/11/2014   ALKPHOS 58 04/11/2014   BILITOT 0.41 04/11/2014     NARRATIVE: [name] was seen today for weekly treatment management. The chart was checked and the patient's films were reviewed.  Weekly rad txs left breast 12/33 compleetd,very mild erythema,skin intact,using rdiaplex bid, c/o low back pain 10/10 takes tramadol in evenings, makes her sleep,  gabapentin also and tylenol 550m , just started  Hurting more since last Friday,appetite fair to poor, fatigued, drinking boost,eating yogurt mostly,   PHYSICAL EXAMINATION: vitals were not taken for this visit.     the patient's skin shows mild erythema. No desquamation.  ASSESSMENT: The patient is doing satisfactorily with treatment.  PLAN: We will continue with the patient's radiation treatment as planned. The patient will further discuss her back pain with her physician who has been managing this. She states today that her current pain medication is not adequate and she will pursue this further. This is unrelated to her radiation treatment.

## 2014-04-20 NOTE — Progress Notes (Signed)
Weekly rad txs left breast 12/33 compleetd,very mild erythema,skin intact,using rdiaplex bid, c/o low back pain 10/10 takes tramadol in evenings, makes her sleep, gabapentin also and tylenol 500mg  , just started  Hurting more since last Friday,appetite fair to poor, fatigued, drinking boost,eating yogurt mostly,  8:32 AM

## 2014-04-21 ENCOUNTER — Ambulatory Visit
Admission: RE | Admit: 2014-04-21 | Discharge: 2014-04-21 | Disposition: A | Discharge status: Home / Self Care | Payer: Medicaid Other | Source: Ambulatory Visit | Attending: Radiation Oncology | Admitting: Radiation Oncology

## 2014-04-21 ENCOUNTER — Ambulatory Visit: Payer: Medicaid Other

## 2014-04-21 DIAGNOSIS — Z510 Encounter for antineoplastic radiation therapy: Secondary | ICD-10-CM | POA: Diagnosis not present

## 2014-04-22 ENCOUNTER — Ambulatory Visit
Admission: RE | Admit: 2014-04-22 | Discharge: 2014-04-22 | Disposition: A | Discharge status: Home / Self Care | Payer: Medicaid Other | Source: Ambulatory Visit | Attending: Radiation Oncology | Admitting: Radiation Oncology

## 2014-04-22 ENCOUNTER — Ambulatory Visit: Payer: Medicaid Other

## 2014-04-22 DIAGNOSIS — Z510 Encounter for antineoplastic radiation therapy: Secondary | ICD-10-CM | POA: Diagnosis not present

## 2014-04-25 ENCOUNTER — Ambulatory Visit: Payer: Medicaid Other

## 2014-04-25 ENCOUNTER — Ambulatory Visit
Admission: RE | Admit: 2014-04-25 | Discharge: 2014-04-25 | Disposition: A | Discharge status: Home / Self Care | Payer: Medicaid Other | Source: Ambulatory Visit | Attending: Radiation Oncology | Admitting: Radiation Oncology

## 2014-04-25 DIAGNOSIS — Z510 Encounter for antineoplastic radiation therapy: Secondary | ICD-10-CM | POA: Diagnosis not present

## 2014-04-26 ENCOUNTER — Ambulatory Visit
Admission: RE | Admit: 2014-04-26 | Discharge: 2014-04-26 | Disposition: A | Discharge status: Home / Self Care | Payer: Medicaid Other | Source: Ambulatory Visit | Attending: Radiation Oncology | Admitting: Radiation Oncology

## 2014-04-26 ENCOUNTER — Ambulatory Visit: Payer: Medicaid Other

## 2014-04-26 DIAGNOSIS — Z510 Encounter for antineoplastic radiation therapy: Secondary | ICD-10-CM | POA: Diagnosis not present

## 2014-04-27 ENCOUNTER — Ambulatory Visit
Admission: RE | Admit: 2014-04-27 | Discharge: 2014-04-27 | Disposition: A | Discharge status: Home / Self Care | Payer: Medicaid Other | Source: Ambulatory Visit | Attending: Radiation Oncology | Admitting: Radiation Oncology

## 2014-04-27 ENCOUNTER — Ambulatory Visit: Payer: Medicaid Other

## 2014-04-27 DIAGNOSIS — Z510 Encounter for antineoplastic radiation therapy: Secondary | ICD-10-CM | POA: Diagnosis not present

## 2014-04-28 ENCOUNTER — Ambulatory Visit
Admission: RE | Admit: 2014-04-28 | Discharge: 2014-04-28 | Disposition: A | Discharge status: Home / Self Care | Payer: Medicaid Other | Source: Ambulatory Visit | Attending: Radiation Oncology | Admitting: Radiation Oncology

## 2014-04-28 ENCOUNTER — Ambulatory Visit: Payer: Medicaid Other

## 2014-04-28 DIAGNOSIS — Z510 Encounter for antineoplastic radiation therapy: Secondary | ICD-10-CM | POA: Diagnosis not present

## 2014-04-29 ENCOUNTER — Ambulatory Visit
Admission: RE | Admit: 2014-04-29 | Discharge: 2014-04-29 | Disposition: A | Discharge status: Home / Self Care | Payer: Medicaid Other | Source: Ambulatory Visit | Attending: Radiation Oncology | Admitting: Radiation Oncology

## 2014-04-29 ENCOUNTER — Telehealth: Payer: Self-pay | Admitting: Family Medicine

## 2014-04-29 ENCOUNTER — Ambulatory Visit: Payer: Medicaid Other

## 2014-04-29 VITALS — BP 116/73 | HR 67 | Temp 98.0°F | Resp 16 | Wt 237.6 lb

## 2014-04-29 DIAGNOSIS — Z7982 Long term (current) use of aspirin: Secondary | ICD-10-CM | POA: Diagnosis not present

## 2014-04-29 DIAGNOSIS — Z510 Encounter for antineoplastic radiation therapy: Secondary | ICD-10-CM | POA: Insufficient documentation

## 2014-04-29 DIAGNOSIS — C50212 Malignant neoplasm of upper-inner quadrant of left female breast: Secondary | ICD-10-CM | POA: Diagnosis not present

## 2014-04-29 DIAGNOSIS — C50219 Malignant neoplasm of upper-inner quadrant of unspecified female breast: Secondary | ICD-10-CM

## 2014-04-29 DIAGNOSIS — M4726 Other spondylosis with radiculopathy, lumbar region: Secondary | ICD-10-CM

## 2014-04-29 DIAGNOSIS — L599 Disorder of the skin and subcutaneous tissue related to radiation, unspecified: Secondary | ICD-10-CM | POA: Diagnosis not present

## 2014-04-29 MED ORDER — RADIAPLEXRX EX GEL
Freq: Once | CUTANEOUS | Status: AC
  Administered 2014-04-29: 09:00:00 via TOPICAL

## 2014-04-29 NOTE — Progress Notes (Signed)
Department of Radiation Oncology  Phone:  [phone] Fax:        [phone]  Weekly Treatment Note    Name: Rachel Herman Date: 04/29/2014 MRN: [id number] DOB: [birth year]   Current dose: 34.2 Gy  Current fraction: 19   MEDICATIONS: Current Outpatient Prescriptions  Medication Sig Dispense Refill  . ACCU-CHEK FASTCLIX LANCETS MISC 1 Units by Percutaneous route 4 (four) times daily. 100 each 12  . acetaminophen (TYLENOL) 500 MG tablet Take 500 mg by mouth every 6 (six) hours as needed.    . Ascorbic Acid (VITAMIN C) 1000 MG tablet Take 1,000 mg by mouth daily.    Marland Kitchen aspirin 325 MG tablet Take 325 mg by mouth once.    Marland Kitchen atorvastatin (LIPITOR) 40 MG tablet Take 1 tablet (40 mg total) by mouth daily. 90 tablet 3  . Blood Glucose Monitoring Suppl (ACCU-CHEK NANO SMARTVIEW) W/DEVICE KIT 1 kit by Subdermal route as directed. Check blood sugars for fasting, and two hours after breakfast, lunch and dinner (4 checks daily) 1 kit 0  . Docusate Sodium (DSS) 100 MG CAPS Take 100 mg by mouth 2 (two) times daily. 30 each 3  . gabapentin (NEURONTIN) 300 MG capsule 300 mg during the day, 600 mg at night by mouth 270 capsule 1  . glucose blood (ACCU-CHEK SMARTVIEW) test strip Use as instructed to check blood sugars 100 each 12  . hyaluronate sodium (RADIAPLEXRX) GEL Apply 1 application topically 2 (two) times daily. Apply to affected rad tx area 2x day,after rad tx  and bedtime or in am ,just not 4 hours prior to treatment daily    . hydrochlorothiazide (HYDRODIURIL) 25 MG tablet Take 1 tablet (25 mg total) by mouth daily. 90 tablet 3  . lisinopril-hydrochlorothiazide (PRINZIDE,ZESTORETIC) 20-25 MG per tablet Take 1 tablet by mouth daily. 90 tablet 3  . metFORMIN (GLUCOPHAGE) 500 MG tablet Take 1 tablet (500 mg total) by mouth 2 (two) times daily. 180 tablet 3  . methocarbamol (ROBAXIN) 500 MG tablet Take 1 tablet (500 mg total) by mouth at bedtime as needed for muscle spasms. 30 tablet 1    . Multiple Vitamins-Minerals (MULTIVITAMIN WITH MINERALS) tablet Take 1 tablet by mouth daily.    . non-metallic deodorant Jethro Poling) MISC Apply 1 application topically daily as needed.    . traMADol (ULTRAM) 50 MG tablet Take 0.5-1 tablets (25-50 mg total) by mouth every 8 (eight) hours as needed. 90 tablet 0  . vitamin D, CHOLECALCIFEROL, 400 UNITS tablet Take 400 Units by mouth daily.     No current facility-administered medications for this encounter.     ALLERGIES: Review of patient's allergies indicates no known allergies.   LABORATORY DATA:  Lab Results  Component Value Date   WBC 9.3 04/11/2014   HGB 12.7 04/11/2014   HCT 39.8 04/11/2014   MCV 87.7 04/11/2014   PLT 297 04/11/2014   Lab Results  Component Value Date   NA 140 04/11/2014   K 4.1 04/11/2014   CL 100 12/27/2013   CO2 28 04/11/2014   Lab Results  Component Value Date   ALT 51 04/11/2014   AST 48* 04/11/2014   ALKPHOS 58 04/11/2014   BILITOT 0.41 04/11/2014     NARRATIVE: Rachel Herman was seen today for weekly treatment management. The chart was checked and the patient's films were reviewed.  Mild hyperpigmentation of left/treated breast without desquamation. Reports nipple of left breast is now tender. Reports using radiaplex bid as directed. Provided patient with  additional tube today. Reports fatigue and decreased appetite.  PHYSICAL EXAMINATION: weight is 237 lb 9.6 oz (107.775 kg). Her oral temperature is 98 F (36.7 C). Her blood pressure is 116/73 and her pulse is 67. Her respiration is 16 and oxygen saturation is 100%.      the patient's skin shows some mild erythema. No desquamation.  ASSESSMENT: The patient is doing satisfactorily with treatment.  PLAN: We will continue with the patient's radiation treatment as planned.

## 2014-04-29 NOTE — Telephone Encounter (Signed)
Pt called requesting medication refill on traMADol (ULTRAM) 50 MG tablet. Please f/u with pt

## 2014-04-29 NOTE — Progress Notes (Signed)
Mild hyperpigmentation of left/treated breast without desquamation. Reports nipple of left breast is now tender. Reports using radiaplex bid as directed. Provided patient with additional tube today. Reports fatigue and decreased appetite.

## 2014-05-02 ENCOUNTER — Ambulatory Visit
Admission: RE | Admit: 2014-05-02 | Discharge: 2014-05-02 | Disposition: A | Discharge status: Home / Self Care | Payer: Medicaid Other | Source: Ambulatory Visit | Attending: Radiation Oncology | Admitting: Radiation Oncology

## 2014-05-02 ENCOUNTER — Ambulatory Visit: Payer: Medicaid Other

## 2014-05-02 DIAGNOSIS — Z510 Encounter for antineoplastic radiation therapy: Secondary | ICD-10-CM | POA: Diagnosis not present

## 2014-05-02 MED ORDER — TRAMADOL HCL 50 MG PO TABS: 25.0000 mg | ORAL_TABLET | Freq: Three times a day (TID) | ORAL | Status: DC | PRN

## 2014-05-02 NOTE — Telephone Encounter (Signed)
Refilled tramadol. Placed up front for pick up.

## 2014-05-02 NOTE — Addendum Note (Signed)
Addended by: Boykin Nearing on: 05/02/2014 11:21 AM   Modules accepted: Orders

## 2014-05-02 NOTE — Telephone Encounter (Signed)
Pt aware.

## 2014-05-03 ENCOUNTER — Ambulatory Visit: Payer: Medicaid Other

## 2014-05-03 ENCOUNTER — Ambulatory Visit
Admission: RE | Admit: 2014-05-03 | Discharge: 2014-05-03 | Disposition: A | Discharge status: Home / Self Care | Payer: Medicaid Other | Source: Ambulatory Visit | Attending: Radiation Oncology | Admitting: Radiation Oncology

## 2014-05-03 DIAGNOSIS — Z510 Encounter for antineoplastic radiation therapy: Secondary | ICD-10-CM | POA: Diagnosis not present

## 2014-05-04 ENCOUNTER — Ambulatory Visit: Payer: Medicaid Other

## 2014-05-04 ENCOUNTER — Encounter: Payer: Self-pay | Admitting: Radiation Oncology

## 2014-05-04 ENCOUNTER — Ambulatory Visit
Admission: RE | Admit: 2014-05-04 | Discharge: 2014-05-04 | Disposition: A | Discharge status: Home / Self Care | Payer: Medicaid Other | Source: Ambulatory Visit | Attending: Radiation Oncology | Admitting: Radiation Oncology

## 2014-05-04 VITALS — BP 136/74 | HR 70 | Temp 97.6°F | Resp 16 | Ht 64.0 in | Wt 238.4 lb

## 2014-05-04 DIAGNOSIS — C50212 Malignant neoplasm of upper-inner quadrant of left female breast: Secondary | ICD-10-CM

## 2014-05-04 DIAGNOSIS — Z510 Encounter for antineoplastic radiation therapy: Secondary | ICD-10-CM | POA: Diagnosis not present

## 2014-05-04 NOTE — Progress Notes (Signed)
Department of Radiation Oncology  Phone:  [phone] Fax:        [phone]  Weekly Treatment Note    Name: Rachel Herman Date: 05/04/2014 MRN: [id number] DOB: 1950/07/26   Current dose: 39.6 Gy  Current fraction: 22   MEDICATIONS: Current Outpatient Prescriptions  Medication Sig Dispense Refill  . ACCU-CHEK FASTCLIX LANCETS MISC 1 Units by Percutaneous route 4 (four) times daily. 100 each 12  . acetaminophen (TYLENOL) 500 MG tablet Take 500 mg by mouth every 6 (six) hours as needed.    . Ascorbic Acid (VITAMIN C) 1000 MG tablet Take 1,000 mg by mouth daily.    Marland Kitchen aspirin 325 MG tablet Take 325 mg by mouth once.    Marland Kitchen atorvastatin (LIPITOR) 40 MG tablet Take 1 tablet (40 mg total) by mouth daily. 90 tablet 3  . Blood Glucose Monitoring Suppl (ACCU-CHEK NANO SMARTVIEW) W/DEVICE KIT 1 kit by Subdermal route as directed. Check blood sugars for fasting, and two hours after breakfast, lunch and dinner (4 checks daily) 1 kit 0  . Docusate Sodium (DSS) 100 MG CAPS Take 100 mg by mouth 2 (two) times daily. 30 each 3  . gabapentin (NEURONTIN) 300 MG capsule 300 mg during the day, 600 mg at night by mouth 270 capsule 1  . glucose blood (ACCU-CHEK SMARTVIEW) test strip Use as instructed to check blood sugars 100 each 12  . hyaluronate sodium (RADIAPLEXRX) GEL Apply 1 application topically 2 (two) times daily. Apply to affected rad tx area 2x day,after rad tx  and bedtime or in am ,just not 4 hours prior to treatment daily    . hydrochlorothiazide (HYDRODIURIL) 25 MG tablet Take 1 tablet (25 mg total) by mouth daily. 90 tablet 3  . lisinopril-hydrochlorothiazide (PRINZIDE,ZESTORETIC) 20-25 MG per tablet Take 1 tablet by mouth daily. 90 tablet 3  . metFORMIN (GLUCOPHAGE) 500 MG tablet Take 1 tablet (500 mg total) by mouth 2 (two) times daily. 180 tablet 3  . methocarbamol (ROBAXIN) 500 MG tablet Take 1 tablet (500 mg total) by mouth at bedtime as needed for muscle spasms. 30 tablet 1    . Multiple Vitamins-Minerals (MULTIVITAMIN WITH MINERALS) tablet Take 1 tablet by mouth daily.    . non-metallic deodorant Jethro Poling) MISC Apply 1 application topically daily as needed.    . traMADol (ULTRAM) 50 MG tablet Take 0.5-1 tablets (25-50 mg total) by mouth every 8 (eight) hours as needed. 90 tablet 0  . vitamin D, CHOLECALCIFEROL, 400 UNITS tablet Take 400 Units by mouth daily.     No current facility-administered medications for this encounter.     ALLERGIES: Review of patient's allergies indicates no known allergies.   LABORATORY DATA:  Lab Results  Component Value Date   WBC 9.3 04/11/2014   HGB 12.7 04/11/2014   HCT 39.8 04/11/2014   MCV 87.7 04/11/2014   PLT 297 04/11/2014   Lab Results  Component Value Date   NA 140 04/11/2014   K 4.1 04/11/2014   CL 100 12/27/2013   CO2 28 04/11/2014   Lab Results  Component Value Date   ALT 51 04/11/2014   AST 48* 04/11/2014   ALKPHOS 58 04/11/2014   BILITOT 0.41 04/11/2014     NARRATIVE: Rachel Herman was seen today for weekly treatment management. The chart was checked and the patient's films were reviewed.  Poor appetite but supplementing with nutritional shakes. Wt stable this week   PHYSICAL EXAMINATION: height is _0  (1.626 m) and weight is  238 lb 6.4 oz (108.138 kg). Her oral temperature is 97.6 F (36.4 C). Her blood pressure is 136/74 and her pulse is 70. Her respiration is 16.    moderate erythema of left/treated breast without desquamation.   ASSESSMENT: The patient is doing satisfactorily with treatment.  PLAN: We will continue with the patient's radiation treatment as planned.    -----------------------------------  Eppie Gibson, MD

## 2014-05-04 NOTE — Progress Notes (Signed)
wekly rad txs left breast, 22/33 completed, erythema , nipple area sore,  Skin intact, occasional burning stated, doesn't last long, no itching, using rdiaplex tid, appetite poor, energy level poor,sleeps a lot stated 8:29 AM

## 2014-05-05 ENCOUNTER — Ambulatory Visit: Payer: Medicaid Other

## 2014-05-05 ENCOUNTER — Ambulatory Visit
Admission: RE | Admit: 2014-05-05 | Discharge: 2014-05-05 | Disposition: A | Discharge status: Home / Self Care | Payer: Medicaid Other | Source: Ambulatory Visit | Attending: Radiation Oncology | Admitting: Radiation Oncology

## 2014-05-05 DIAGNOSIS — Z510 Encounter for antineoplastic radiation therapy: Secondary | ICD-10-CM | POA: Diagnosis not present

## 2014-05-06 ENCOUNTER — Ambulatory Visit: Payer: Medicaid Other

## 2014-05-06 ENCOUNTER — Ambulatory Visit: Admission: RE | Admit: 2014-05-06 | Payer: Medicaid Other | Source: Ambulatory Visit

## 2014-05-09 ENCOUNTER — Encounter: Payer: Self-pay | Admitting: Radiation Oncology

## 2014-05-09 ENCOUNTER — Ambulatory Visit
Admission: RE | Admit: 2014-05-09 | Discharge: 2014-05-09 | Disposition: A | Discharge status: Home / Self Care | Payer: Medicaid Other | Source: Ambulatory Visit | Attending: Radiation Oncology | Admitting: Radiation Oncology

## 2014-05-09 ENCOUNTER — Ambulatory Visit: Payer: Medicaid Other

## 2014-05-09 DIAGNOSIS — Z510 Encounter for antineoplastic radiation therapy: Secondary | ICD-10-CM | POA: Diagnosis not present

## 2014-05-10 ENCOUNTER — Ambulatory Visit
Admission: RE | Admit: 2014-05-10 | Discharge: 2014-05-10 | Disposition: A | Discharge status: Home / Self Care | Payer: Medicaid Other | Source: Ambulatory Visit | Attending: Radiation Oncology | Admitting: Radiation Oncology

## 2014-05-10 ENCOUNTER — Ambulatory Visit: Payer: Medicaid Other

## 2014-05-10 DIAGNOSIS — Z510 Encounter for antineoplastic radiation therapy: Secondary | ICD-10-CM | POA: Diagnosis not present

## 2014-05-11 ENCOUNTER — Ambulatory Visit: Payer: Medicaid Other

## 2014-05-11 ENCOUNTER — Encounter: Payer: Self-pay | Admitting: Radiation Oncology

## 2014-05-11 ENCOUNTER — Ambulatory Visit
Admission: RE | Admit: 2014-05-11 | Discharge: 2014-05-11 | Disposition: A | Discharge status: Home / Self Care | Payer: Medicaid Other | Source: Ambulatory Visit | Attending: Radiation Oncology | Admitting: Radiation Oncology

## 2014-05-11 ENCOUNTER — Ambulatory Visit: Payer: Medicaid Other | Admitting: Radiation Oncology

## 2014-05-11 VITALS — BP 153/95 | HR 83 | Temp 97.7°F | Resp 20 | Wt 236.1 lb

## 2014-05-11 DIAGNOSIS — Z7982 Long term (current) use of aspirin: Secondary | ICD-10-CM | POA: Diagnosis not present

## 2014-05-11 DIAGNOSIS — E119 Type 2 diabetes mellitus without complications: Secondary | ICD-10-CM | POA: Insufficient documentation

## 2014-05-11 DIAGNOSIS — E785 Hyperlipidemia, unspecified: Secondary | ICD-10-CM | POA: Insufficient documentation

## 2014-05-11 DIAGNOSIS — I10 Essential (primary) hypertension: Secondary | ICD-10-CM | POA: Insufficient documentation

## 2014-05-11 DIAGNOSIS — C50212 Malignant neoplasm of upper-inner quadrant of left female breast: Secondary | ICD-10-CM

## 2014-05-11 DIAGNOSIS — Z510 Encounter for antineoplastic radiation therapy: Secondary | ICD-10-CM | POA: Insufficient documentation

## 2014-05-11 DIAGNOSIS — Z9012 Acquired absence of left breast and nipple: Secondary | ICD-10-CM | POA: Insufficient documentation

## 2014-05-11 MED ORDER — RADIAPLEXRX EX GEL
Freq: Once | CUTANEOUS | Status: AC
  Administered 2014-05-11: 09:00:00 via TOPICAL

## 2014-05-11 NOTE — Progress Notes (Signed)
Department of Radiation Oncology  Phone:  [phone] Fax:        [phone]  Weekly Treatment Note    Name: Rachel Herman Date: 05/11/2014 MRN: [id number] DOB: [birth year]   Current dose: 46.8 Gy  Current fraction: 26   MEDICATIONS: Current Outpatient Prescriptions  Medication Sig Dispense Refill  . ACCU-CHEK FASTCLIX LANCETS MISC 1 Units by Percutaneous route 4 (four) times daily. 100 each 12  . acetaminophen (TYLENOL) 500 MG tablet Take 500 mg by mouth every 6 (six) hours as needed.    . Ascorbic Acid (VITAMIN C) 1000 MG tablet Take 1,000 mg by mouth daily.    Marland Kitchen aspirin 325 MG tablet Take 325 mg by mouth once.    Marland Kitchen atorvastatin (LIPITOR) 40 MG tablet Take 1 tablet (40 mg total) by mouth daily. 90 tablet 3  . Blood Glucose Monitoring Suppl (ACCU-CHEK NANO SMARTVIEW) W/DEVICE KIT 1 kit by Subdermal route as directed. Check blood sugars for fasting, and two hours after breakfast, lunch and dinner (4 checks daily) 1 kit 0  . Docusate Sodium (DSS) 100 MG CAPS Take 100 mg by mouth 2 (two) times daily. 30 each 3  . gabapentin (NEURONTIN) 300 MG capsule 300 mg during the day, 600 mg at night by mouth 270 capsule 1  . glucose blood (ACCU-CHEK SMARTVIEW) test strip Use as instructed to check blood sugars 100 each 12  . hyaluronate sodium (RADIAPLEXRX) GEL Apply 1 application topically 2 (two) times daily. Apply to affected rad tx area 2x day,after rad tx  and bedtime or in am ,just not 4 hours prior to treatment daily    . hydrochlorothiazide (HYDRODIURIL) 25 MG tablet Take 1 tablet (25 mg total) by mouth daily. 90 tablet 3  . lisinopril-hydrochlorothiazide (PRINZIDE,ZESTORETIC) 20-25 MG per tablet Take 1 tablet by mouth daily. 90 tablet 3  . metFORMIN (GLUCOPHAGE) 500 MG tablet Take 1 tablet (500 mg total) by mouth 2 (two) times daily. 180 tablet 3  . methocarbamol (ROBAXIN) 500 MG tablet Take 1 tablet (500 mg total) by mouth at bedtime as needed for muscle spasms. 30 tablet 1    . Multiple Vitamins-Minerals (MULTIVITAMIN WITH MINERALS) tablet Take 1 tablet by mouth daily.    . non-metallic deodorant Jethro Poling) MISC Apply 1 application topically daily as needed.    . traMADol (ULTRAM) 50 MG tablet Take 0.5-1 tablets (25-50 mg total) by mouth every 8 (eight) hours as needed. 90 tablet 0  . vitamin D, CHOLECALCIFEROL, 400 UNITS tablet Take 400 Units by mouth daily.     No current facility-administered medications for this encounter.     ALLERGIES: Review of patient's allergies indicates no known allergies.   LABORATORY DATA:  Lab Results  Component Value Date   WBC 9.3 04/11/2014   HGB 12.7 04/11/2014   HCT 39.8 04/11/2014   MCV 87.7 04/11/2014   PLT 297 04/11/2014   Lab Results  Component Value Date   NA 140 04/11/2014   K 4.1 04/11/2014   CL 100 12/27/2013   CO2 28 04/11/2014   Lab Results  Component Value Date   ALT 51 04/11/2014   AST 48* 04/11/2014   ALKPHOS 58 04/11/2014   BILITOT 0.41 04/11/2014     NARRATIVE: Rachel Herman was seen today for weekly treatment management. The chart was checked and the patient's films were reviewed.  Weekly rad txs left breast 26/33 completed, erythema ,skin intact,  Uses radiaplex 2-3xday, gave another tube per request, c/o burning and stings some  at times,  Appetite fair,  12:28 PM   PHYSICAL EXAMINATION: weight is 236 lb 1.6 oz (107.094 kg). Her oral temperature is 97.7 F (36.5 C). Her blood pressure is 153/95 and her pulse is 83. Her respiration is 20.      the patient's skin shows some broad erythema. No significant difficulties with desquamation. Overall her skin looks excellent for where she is in her treatment.  ASSESSMENT: The patient is doing satisfactorily with treatment.  PLAN: We will continue with the patient's radiation treatment as planned.

## 2014-05-11 NOTE — Progress Notes (Signed)
Weekly rad txs left breast 26/33 completed, erythema ,skin intact,  Uses radiaplex 2-3xday, gave another tube per request, c/o burning and stings some at times,  Appetite fair,  8:35 AM

## 2014-05-12 ENCOUNTER — Ambulatory Visit
Admission: RE | Admit: 2014-05-12 | Discharge: 2014-05-12 | Disposition: A | Discharge status: Home / Self Care | Payer: Medicaid Other | Source: Ambulatory Visit | Attending: Radiation Oncology | Admitting: Radiation Oncology

## 2014-05-12 ENCOUNTER — Ambulatory Visit: Payer: Medicaid Other

## 2014-05-12 DIAGNOSIS — Z510 Encounter for antineoplastic radiation therapy: Secondary | ICD-10-CM | POA: Diagnosis not present

## 2014-05-13 ENCOUNTER — Ambulatory Visit: Payer: Medicaid Other

## 2014-05-13 ENCOUNTER — Ambulatory Visit
Admission: RE | Admit: 2014-05-13 | Discharge: 2014-05-13 | Disposition: A | Discharge status: Home / Self Care | Payer: Medicaid Other | Source: Ambulatory Visit | Attending: Radiation Oncology | Admitting: Radiation Oncology

## 2014-05-13 DIAGNOSIS — Z510 Encounter for antineoplastic radiation therapy: Secondary | ICD-10-CM | POA: Diagnosis not present

## 2014-05-16 ENCOUNTER — Ambulatory Visit: Payer: Medicaid Other

## 2014-05-16 ENCOUNTER — Ambulatory Visit
Admission: RE | Admit: 2014-05-16 | Discharge: 2014-05-16 | Disposition: A | Discharge status: Home / Self Care | Payer: Medicaid Other | Source: Ambulatory Visit | Attending: Radiation Oncology | Admitting: Radiation Oncology

## 2014-05-16 DIAGNOSIS — Z510 Encounter for antineoplastic radiation therapy: Secondary | ICD-10-CM | POA: Diagnosis not present

## 2014-05-17 ENCOUNTER — Ambulatory Visit: Payer: Medicaid Other

## 2014-05-17 ENCOUNTER — Ambulatory Visit
Admission: RE | Admit: 2014-05-17 | Discharge: 2014-05-17 | Disposition: A | Discharge status: Home / Self Care | Payer: Medicaid Other | Source: Ambulatory Visit | Attending: Radiation Oncology | Admitting: Radiation Oncology

## 2014-05-17 DIAGNOSIS — Z510 Encounter for antineoplastic radiation therapy: Secondary | ICD-10-CM | POA: Diagnosis not present

## 2014-05-18 ENCOUNTER — Ambulatory Visit
Admission: RE | Admit: 2014-05-18 | Discharge: 2014-05-18 | Disposition: A | Discharge status: Home / Self Care | Payer: Medicaid Other | Source: Ambulatory Visit | Attending: Radiation Oncology | Admitting: Radiation Oncology

## 2014-05-18 ENCOUNTER — Ambulatory Visit: Payer: Medicaid Other

## 2014-05-18 DIAGNOSIS — Z510 Encounter for antineoplastic radiation therapy: Secondary | ICD-10-CM | POA: Diagnosis not present

## 2014-05-19 ENCOUNTER — Ambulatory Visit
Admission: RE | Admit: 2014-05-19 | Discharge: 2014-05-19 | Disposition: A | Discharge status: Home / Self Care | Payer: Medicaid Other | Source: Ambulatory Visit | Attending: Radiation Oncology | Admitting: Radiation Oncology

## 2014-05-19 ENCOUNTER — Ambulatory Visit: Payer: Medicaid Other

## 2014-05-19 DIAGNOSIS — Z510 Encounter for antineoplastic radiation therapy: Secondary | ICD-10-CM | POA: Diagnosis not present

## 2014-05-20 ENCOUNTER — Ambulatory Visit
Admission: RE | Admit: 2014-05-20 | Discharge: 2014-05-20 | Disposition: A | Discharge status: Home / Self Care | Payer: Medicaid Other | Source: Ambulatory Visit | Attending: Radiation Oncology | Admitting: Radiation Oncology

## 2014-05-20 ENCOUNTER — Encounter: Payer: Self-pay | Admitting: Radiation Oncology

## 2014-05-20 VITALS — BP 132/60 | HR 79 | Temp 97.8°F | Resp 20 | Wt 233.0 lb

## 2014-05-20 DIAGNOSIS — C50212 Malignant neoplasm of upper-inner quadrant of left female breast: Secondary | ICD-10-CM

## 2014-05-20 DIAGNOSIS — Z510 Encounter for antineoplastic radiation therapy: Secondary | ICD-10-CM | POA: Diagnosis not present

## 2014-05-20 NOTE — Progress Notes (Signed)
Weekly rad txs left breast,erythmea, under inframmary  Fold peeling, gave another radiaplex gel tube, neosporin to apply where skin peeling, last tx 33/33, 1 month f/u appt card given return May 26,2016  BP 132/60 mmHg  Pulse 79  Temp(Src) 97.8 F (36.6 C) (Oral)  Resp 20  Wt 233 lb (105.688 kg)  Wt Readings from Last 3 Encounters:  04/29/14 237 lb 9.6 oz (107.775 kg)  04/11/14 240 lb 1.6 oz (108.909 kg)  03/30/14 244 lb (110.678 kg)  8:10 AM

## 2014-05-20 NOTE — Progress Notes (Signed)
Department of Radiation Oncology  Phone:  [phone] Fax:        [phone]  Weekly Treatment Note    Name: Rachel Herman Date: 05/20/2014 MRN: [id number] DOB: 27-Jan-1951    Current fraction: 33  MEDICATIONS: Current Outpatient Prescriptions  Medication Sig Dispense Refill  . ACCU-CHEK FASTCLIX LANCETS MISC 1 Units by Percutaneous route 4 (four) times daily. 100 each 12  . acetaminophen (TYLENOL) 500 MG tablet Take 500 mg by mouth every 6 (six) hours as needed.    . Ascorbic Acid (VITAMIN C) 1000 MG tablet Take 1,000 mg by mouth daily.    Marland Kitchen aspirin 325 MG tablet Take 325 mg by mouth once.    Marland Kitchen atorvastatin (LIPITOR) 40 MG tablet Take 1 tablet (40 mg total) by mouth daily. 90 tablet 3  . Blood Glucose Monitoring Suppl (ACCU-CHEK NANO SMARTVIEW) W/DEVICE KIT 1 kit by Subdermal route as directed. Check blood sugars for fasting, and two hours after breakfast, lunch and dinner (4 checks daily) 1 kit 0  . Docusate Sodium (DSS) 100 MG CAPS Take 100 mg by mouth 2 (two) times daily. 30 each 3  . gabapentin (NEURONTIN) 300 MG capsule 300 mg during the day, 600 mg at night by mouth 270 capsule 1  . glucose blood (ACCU-CHEK SMARTVIEW) test strip Use as instructed to check blood sugars 100 each 12  . hyaluronate sodium (RADIAPLEXRX) GEL Apply 1 application topically 2 (two) times daily. Apply to affected rad tx area 2x day,after rad tx  and bedtime or in am ,just not 4 hours prior to treatment daily    . hydrochlorothiazide (HYDRODIURIL) 25 MG tablet Take 1 tablet (25 mg total) by mouth daily. 90 tablet 3  . lisinopril-hydrochlorothiazide (PRINZIDE,ZESTORETIC) 20-25 MG per tablet Take 1 tablet by mouth daily. 90 tablet 3  . metFORMIN (GLUCOPHAGE) 500 MG tablet Take 1 tablet (500 mg total) by mouth 2 (two) times daily. 180 tablet 3  . methocarbamol (ROBAXIN) 500 MG tablet Take 1 tablet (500 mg total) by mouth at bedtime as needed for muscle spasms. 30 tablet 1  . Multiple  Vitamins-Minerals (MULTIVITAMIN WITH MINERALS) tablet Take 1 tablet by mouth daily.    . non-metallic deodorant Jethro Poling) MISC Apply 1 application topically daily as needed.    . traMADol (ULTRAM) 50 MG tablet Take 0.5-1 tablets (25-50 mg total) by mouth every 8 (eight) hours as needed. 90 tablet 0  . vitamin D, CHOLECALCIFEROL, 400 UNITS tablet Take 400 Units by mouth daily.     No current facility-administered medications for this encounter.     ALLERGIES: Review of patient's allergies indicates no known allergies.   LABORATORY DATA:  Lab Results  Component Value Date   WBC 9.3 04/11/2014   HGB 12.7 04/11/2014   HCT 39.8 04/11/2014   MCV 87.7 04/11/2014   PLT 297 04/11/2014   Lab Results  Component Value Date   NA 140 04/11/2014   K 4.1 04/11/2014   CL 100 12/27/2013   CO2 28 04/11/2014   Lab Results  Component Value Date   ALT 51 04/11/2014   AST 48* 04/11/2014   ALKPHOS 58 04/11/2014   BILITOT 0.41 04/11/2014     NARRATIVE: Rachel Herman was seen today for weekly treatment management. The chart was checked and the patient's films were reviewed.  Weekly rad txs left breast,erythmea, under inframmary  Fold peeling, gave another radiaplex gel tube, neosporin to apply where skin peeling, last tx 33/33, 1 month f/u appt card given  return May 26,2016  BP 132/60 mmHg  Pulse 79  Temp(Src) 97.8 F (36.6 C) (Oral)  Resp 20  Wt 233 lb (105.688 kg)  Wt Readings from Last 3 Encounters:  04/29/14 237 lb 9.6 oz (107.775 kg)  04/11/14 240 lb 1.6 oz (108.909 kg)  03/30/14 244 lb (110.678 kg)  8:29 AM   PHYSICAL EXAMINATION: weight is 233 lb (105.688 kg). Her oral temperature is 97.8 F (36.6 C). Her blood pressure is 132/60 and her pulse is 79. Her respiration is 20.      the patient's skin shows a very small amount of desquamation in the inframammary region. No moist desquamation present.  ASSESSMENT: The patient is doing satisfactorily with treatment. She finishes  treatment today. She has done very well overall.  PLAN:  Follow-up in one month.

## 2014-05-25 ENCOUNTER — Other Ambulatory Visit: Payer: Self-pay | Admitting: Hematology

## 2014-05-25 DIAGNOSIS — Z780 Asymptomatic menopausal state: Secondary | ICD-10-CM

## 2014-05-27 ENCOUNTER — Other Ambulatory Visit: Payer: Self-pay

## 2014-05-27 ENCOUNTER — Ambulatory Visit (HOSPITAL_COMMUNITY)
Admission: RE | Admit: 2014-05-27 | Discharge: 2014-05-27 | Disposition: A | Discharge status: Home / Self Care | Payer: Medicaid Other | Source: Ambulatory Visit | Attending: Hematology | Admitting: Hematology

## 2014-05-27 DIAGNOSIS — M545 Low back pain, unspecified: Secondary | ICD-10-CM

## 2014-05-27 DIAGNOSIS — C50212 Malignant neoplasm of upper-inner quadrant of left female breast: Secondary | ICD-10-CM | POA: Insufficient documentation

## 2014-05-27 DIAGNOSIS — Z780 Asymptomatic menopausal state: Secondary | ICD-10-CM | POA: Diagnosis not present

## 2014-05-27 DIAGNOSIS — M8580 Other specified disorders of bone density and structure, unspecified site: Secondary | ICD-10-CM | POA: Insufficient documentation

## 2014-05-27 DIAGNOSIS — Z13820 Encounter for screening for osteoporosis: Secondary | ICD-10-CM | POA: Insufficient documentation

## 2014-05-27 DIAGNOSIS — M79605 Pain in left leg: Principal | ICD-10-CM

## 2014-05-27 MED ORDER — GABAPENTIN 300 MG PO CAPS
ORAL_CAPSULE | ORAL | Status: DC
Start: 2014-05-27 — End: 2014-12-07

## 2014-05-27 MED ORDER — METHOCARBAMOL 500 MG PO TABS
500.0000 mg | ORAL_TABLET | Freq: Every evening | ORAL | Status: DC | PRN
Start: 2014-05-27 — End: 2014-08-01

## 2014-05-27 NOTE — Progress Notes (Signed)
Complex simulation note  Diagnosis: Left-sided breast cancer  Narrative The patient has initially been planned to receive a course of whole breast radiation to a dose of 50.4 Gy in 28 fractions. The patient will now receive an additional boost to the seroma cavity which has been contoured. This will correspond to a boost of 10 Gy at 5 Gy per fraction. To accomplish this, an additional 3 customized blocks have been designed for this purpose. A complex isodose plan is requested to ensure that the target area is adequately covered with radiation dose and that the nearby normal structures such as the lung are adequately spared. The patient's final total dose will be 60.4 Gy.  ------------------------------------------------  Jodelle Gross, MD, PhD

## 2014-05-27 NOTE — Progress Notes (Signed)
  Radiation Oncology         [phone] ________________________________  Name: Rachel Herman MRN: [id number]  Date: 05/20/2014  DOB: [birth year]  End of Treatment Note  Diagnosis:   T1 cN0 M0 left-sided breast cancer     Indication for treatment:  Curative       Radiation treatment dates:   04/05/2014 through 05/20/2014  Site/dose:   The patient was treated to the whole breast initially using tangent fields. A reduced field technique was used corresponding to a total of 4 fields. This consisted of a 3-D conformal technique. The patient then received a boost to the seroma cavity using a 3 field technique. The patient initially received 50.4 gray, then a 10 gray boost. The final dose was 60.4 gray.  Narrative: The patient tolerated radiation treatment relatively well.   Moderate to mild skin irritation at the end of treatment.  Plan: The patient has completed radiation treatment. The patient will return to radiation oncology clinic for routine followup in one month. I advised the patient to call or return sooner if they have any questions or concerns related to their recovery or treatment. ________________________________  Jodelle Gross, M.D., Ph.D.

## 2014-06-02 ENCOUNTER — Encounter: Payer: Self-pay | Admitting: Family Medicine

## 2014-06-02 ENCOUNTER — Ambulatory Visit: Payer: Medicaid Other | Attending: Family Medicine | Admitting: Family Medicine

## 2014-06-02 VITALS — BP 112/74 | HR 93 | Temp 97.8°F | Ht 66.0 in | Wt 235.0 lb

## 2014-06-02 DIAGNOSIS — E119 Type 2 diabetes mellitus without complications: Secondary | ICD-10-CM

## 2014-06-02 DIAGNOSIS — M4726 Other spondylosis with radiculopathy, lumbar region: Secondary | ICD-10-CM

## 2014-06-02 DIAGNOSIS — R1030 Lower abdominal pain, unspecified: Secondary | ICD-10-CM

## 2014-06-02 DIAGNOSIS — R1033 Periumbilical pain: Secondary | ICD-10-CM | POA: Diagnosis not present

## 2014-06-02 LAB — POCT URINALYSIS DIPSTICK
Bilirubin, UA: NEGATIVE
Blood, UA: NEGATIVE
Glucose, UA: NEGATIVE
Ketones, UA: NEGATIVE
Nitrite, UA: NEGATIVE
Protein, UA: NEGATIVE
Spec Grav, UA: 1.02
Urobilinogen, UA: 0.2
pH, UA: 5.5

## 2014-06-02 LAB — POCT GLYCOSYLATED HEMOGLOBIN (HGB A1C): Hemoglobin A1C: 6.5

## 2014-06-02 LAB — GLUCOSE, POCT (MANUAL RESULT ENTRY): POC Glucose: 160 mg/dl — AB (ref 70–99)

## 2014-06-02 MED ORDER — TRAMADOL HCL 50 MG PO TABS: 25.0000 mg | ORAL_TABLET | Freq: Three times a day (TID) | ORAL | Status: DC | PRN

## 2014-06-02 MED ORDER — GLUCOSE BLOOD VI STRP: 1.0000 | ORAL_STRIP | Freq: Three times a day (TID) | Status: DC

## 2014-06-02 MED ORDER — ONDANSETRON HCL 8 MG PO TABS: 8.0000 mg | ORAL_TABLET | Freq: Three times a day (TID) | ORAL | Status: DC | PRN

## 2014-06-02 MED ORDER — ACCU-CHEK MULTICLIX LANCETS MISC: 1.0000 | Freq: Three times a day (TID) | Status: DC

## 2014-06-02 NOTE — Patient Instructions (Addendum)
Mrs. Quraishi,  Thank you for coming in today.  1, diabetes: sugars at goal. Testing supplies ordered   2, htn: BP at goal  3. Abdominal pain and headache: Normal head and neck exam Abdominal exam with pain at belly button, ? Hernia. Normal urine.   Plan: Tramadol for pain Zofran for nausea Please do not take metformin for next 3 days.  Start with x-ray for abdomen, please go today or tomorrow if you find time.   If you develop fever, chills, persistent vomiting please go to ED  F/u in 4 weeks for abdominal pain  Dr. Adrian Blackwater

## 2014-06-02 NOTE — Progress Notes (Signed)
   Subjective:    Patient ID: Rachel Herman, female    DOB: [birth year], 64 y.o.   MRN: [id number] CC: abdominal pain, DM f/u  HPI 64 yo F with recurrent breast cancer s/p radiation  1. Abdominal pain: x 2 weeks. Periumbilical. Nausea and NB/NB emesis x one. Patient does have some neck pain and lightheadedness.  No headache, vision changes,  fever, chills, diarrhea or constipation.   2. DM2: taking metformin. No low CBGs. No elevated CBGs.   Soc Hx: non smoker   Review of Systems As per HPI     Objective:   Physical Exam BP 112/74 mmHg  Pulse 93  Temp(Src) 97.8 F (36.6 C)  Ht 5\' 6"  (1.676 m)  Wt 235 lb (106.595 kg)  BMI 37.95 kg/m2  SpO2 95% General appearance: alert, cooperative, mild distress and morbidly obese Lungs: clear to auscultation bilaterally Heart: regular rate and rhythm, S1, S2 normal, no murmur, click, rub or gallop Abdomen: normal findings: bowel sounds normal, no masses palpable, no organomegaly and symmetric and abnormal findings:  distended and moderate tenderness in the periumbilical area Extremities: extremities normal, atraumatic, no cyanosis or edema       Assessment & Plan:

## 2014-06-02 NOTE — Progress Notes (Signed)
Patient here to follow up on diabetes Patient states she has been having lower abdominal pain for last week Patient needs refills on testing supplies and refill on tramadol No problems urinating Patient states she had a "cyst" in her abdomen in 09/2013 that was the size of a basketball.  She is afraid it is growing again She states she is tired and weak all the time-she finished radiation therapy 2 weeks ago

## 2014-06-03 ENCOUNTER — Ambulatory Visit (HOSPITAL_COMMUNITY)
Admission: RE | Admit: 2014-06-03 | Discharge: 2014-06-03 | Disposition: A | Discharge status: Home / Self Care | Payer: Medicaid Other | Source: Ambulatory Visit | Attending: Family Medicine | Admitting: Family Medicine

## 2014-06-03 ENCOUNTER — Telehealth: Payer: Self-pay | Admitting: Family Medicine

## 2014-06-03 DIAGNOSIS — R140 Abdominal distension (gaseous): Secondary | ICD-10-CM | POA: Diagnosis not present

## 2014-06-03 DIAGNOSIS — R1033 Periumbilical pain: Secondary | ICD-10-CM | POA: Diagnosis present

## 2014-06-03 DIAGNOSIS — K5901 Slow transit constipation: Secondary | ICD-10-CM

## 2014-06-03 DIAGNOSIS — K5900 Constipation, unspecified: Secondary | ICD-10-CM | POA: Insufficient documentation

## 2014-06-03 MED ORDER — MAGNESIUM HYDROXIDE 800 MG/5ML PO SUSP: 30.0000 mL | Freq: Every evening | ORAL | Status: DC

## 2014-06-03 NOTE — Telephone Encounter (Signed)
Called patient  A: increased stool burden on plain film with abdominal pain P: milk of magnesia nightly for next 3-5 days, Rx sent to her pharmacy

## 2014-06-03 NOTE — Assessment & Plan Note (Signed)
A: increased stool burden on plain film with abdominal pain P: milk of magnesia nightly for next 3-5 days

## 2014-06-04 ENCOUNTER — Encounter: Payer: Self-pay | Admitting: Family Medicine

## 2014-06-04 NOTE — Assessment & Plan Note (Signed)
1, diabetes: sugars at goal. Testing supplies ordered

## 2014-06-04 NOTE — Assessment & Plan Note (Signed)
Abdominal pain and headache: Abdominal exam with pain at belly button, ? Hernia. Normal urine.   Plan: Tramadol for pain Zofran for nausea Please do not take metformin for next 3 days.  Start with x-ray for abdomen, please go today or tomorrow if you find time.

## 2014-06-06 ENCOUNTER — Other Ambulatory Visit (HOSPITAL_BASED_OUTPATIENT_CLINIC_OR_DEPARTMENT_OTHER): Payer: Medicaid Other

## 2014-06-06 ENCOUNTER — Telehealth: Payer: Self-pay | Admitting: Hematology

## 2014-06-06 ENCOUNTER — Encounter: Payer: Self-pay | Admitting: Hematology

## 2014-06-06 ENCOUNTER — Ambulatory Visit (HOSPITAL_BASED_OUTPATIENT_CLINIC_OR_DEPARTMENT_OTHER): Payer: Medicaid Other | Admitting: Hematology

## 2014-06-06 VITALS — BP 123/76 | HR 80 | Temp 98.3°F | Resp 18 | Ht 66.0 in | Wt 237.1 lb

## 2014-06-06 DIAGNOSIS — K5900 Constipation, unspecified: Secondary | ICD-10-CM

## 2014-06-06 DIAGNOSIS — C50212 Malignant neoplasm of upper-inner quadrant of left female breast: Secondary | ICD-10-CM

## 2014-06-06 DIAGNOSIS — R109 Unspecified abdominal pain: Secondary | ICD-10-CM

## 2014-06-06 DIAGNOSIS — Z170 Estrogen receptor positive status [ER+]: Secondary | ICD-10-CM | POA: Diagnosis not present

## 2014-06-06 DIAGNOSIS — C50912 Malignant neoplasm of unspecified site of left female breast: Secondary | ICD-10-CM

## 2014-06-06 LAB — COMPREHENSIVE METABOLIC PANEL (CC13)
ALT: 71 U/L — ABNORMAL HIGH (ref 0–55)
AST: 57 U/L — ABNORMAL HIGH (ref 5–34)
Albumin: 3.6 g/dL (ref 3.5–5.0)
Alkaline Phosphatase: 63 U/L (ref 40–150)
Anion Gap: 11 mEq/L (ref 3–11)
BUN: 20 mg/dL (ref 7.0–26.0)
CO2: 27 mEq/L (ref 22–29)
Calcium: 9.2 mg/dL (ref 8.4–10.4)
Chloride: 100 mEq/L (ref 98–109)
Creatinine: 0.9 mg/dL (ref 0.6–1.1)
EGFR: 66 mL/min/{1.73_m2} — ABNORMAL LOW (ref >90)
Glucose: 127 mg/dl (ref 70–140)
Potassium: 4.3 mEq/L (ref 3.5–5.1)
Sodium: 138 mEq/L (ref 136–145)
Total Bilirubin: 0.49 mg/dL (ref 0.20–1.20)
Total Protein: 7.5 g/dL (ref 6.4–8.3)

## 2014-06-06 LAB — CBC WITH DIFFERENTIAL/PLATELET
BASO%: 0.3 % (ref 0.0–2.0)
Basophils Absolute: 0 10*3/uL (ref 0.0–0.1)
EOS%: 1.7 % (ref 0.0–7.0)
Eosinophils Absolute: 0.1 10*3/uL (ref 0.0–0.5)
HCT: 38.1 % (ref 34.8–46.6)
HGB: 12.5 g/dL (ref 11.6–15.9)
LYMPH%: 19.5 % (ref 14.0–49.7)
MCH: 27.9 pg (ref 25.1–34.0)
MCHC: 32.8 g/dL (ref 31.5–36.0)
MCV: 85.1 fL (ref 79.5–101.0)
MONO#: 0.7 10*3/uL (ref 0.1–0.9)
MONO%: 8.2 % (ref 0.0–14.0)
NEUT#: 5.9 10*3/uL (ref 1.5–6.5)
NEUT%: 70.3 % (ref 38.4–76.8)
Platelets: 265 10*3/uL (ref 145–400)
RBC: 4.48 10*6/uL (ref 3.70–5.45)
RDW: 15.1 % — ABNORMAL HIGH (ref 11.2–14.5)
WBC: 8.4 10*3/uL (ref 3.9–10.3)
lymph#: 1.6 10*3/uL (ref 0.9–3.3)

## 2014-06-06 MED ORDER — EXEMESTANE 25 MG PO TABS: 25.0000 mg | ORAL_TABLET | Freq: Every day | ORAL | Status: DC

## 2014-06-06 NOTE — Telephone Encounter (Signed)
Pt confirmed labs/ov per 05/02 POF, gave pt AVS and Calendar.... KJ

## 2014-06-06 NOTE — Progress Notes (Signed)
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 Ultrasound of the left axilla is negative.      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    12/29/2013 Surgery Left lumpectomy with close (<0.1cm) inferior margin   03/02/2014 Surgery Reexcision for close margin, path negative for malignancy.   04/05/2014 -  Radiation Therapy she completed on     INTERIM HISTORY: She returns for follow up. She completed radiation in mid April. She tolerated it well overall. She has had some abdominal discomfort for 3-4 weeks lately and was seen by her PCP. X-ray of abdomen showed possible constipation, no acute changes. Her PCP recommended her to take milk of magnesium and she had a good BM afterwards, but the abdominal pain did not resolve. She did have mild nausea and vomited a few times last week. No fever or chills.   MEDICAL HISTORY:  Past Medical  History  Diagnosis Date  . Obesity, morbid, BMI 40.0-49.9   . Hypertension 2014  . Hyperlipidemia   . PONV (postoperative nausea and vomiting)   . Breast cancer 12/09/13    left breast Invasive Ductal Carcinoma  . Diabetes mellitus 09/16/13    Diagnosed on 09/16/13; HgA1C was 7.2    SURGICAL HISTORY: Past Surgical History  Procedure Laterality Date  . Abdominal hysterectomy N/A 09/20/2013    Procedure: HYSTERECTOMY ABDOMINAL;  Surgeon: Osborne Oman, MD;  Location: [facility];  Service: Gynecology;  Laterality: N/A;  . Salpingoophorectomy  09/20/2013    Procedure: SALPINGO OOPHORECTOMY;  Surgeon: Osborne Oman, MD;  Location: [facility];  Service: Gynecology;;  . Laparoscopic assisted vaginal hysterectomy  09/20/2013    Procedure: LAPAROSCOPIC ASSISTED VAGINAL HYSTERECTOMY;  Surgeon: Osborne Oman, MD;  Location: [facility];  Service: Gynecology;;  PT WAS EXAMINED WHILE UP IN [place] AND IT WAS DECIDED TO OPEN PT DUE TO LARGE MASS  . Breast lumpectomy with axillary lymph node biopsy Left 12/29/13    left breast   . Radioactive seed guided mastectomy with axillary sentinel lymph node biopsy Left 12/29/2013    Procedure: LEFT BREAST RADIOACTIVE SEED LOCALIZED LUMPECTOMY WITH SENTINEL LYMPH NODE MAPPING;  Surgeon: Erroll Luna, MD;  Location: [facility];  Service: General;  Laterality: Left;  . Re-excision of breast lumpectomy Left 03/02/2014    Procedure: RE-EXCISION OF LEFT BREAST LUMPECTOMY;  Surgeon: Erroll Luna, MD;  Location: [facility];  Service: General;  Laterality: Left;    SOCIAL HISTORY: History   Social History  . Marital Status: Married    Spouse Name: Yarethzi Branan   . Number of Children: 3  . Years of Education: 12   Occupational History  .  Unemployed   Social History Main Topics  . Smoking status: Never Smoker   . Smokeless tobacco: Never Used  . Alcohol Use: No  . Drug Use: No  . Sexual Activity: Not Currently    Birth  Control/ Protection: Post-menopausal   Other Topics Concern  . Not on file   Social History Narrative   Married to [employer] in 1986.    Has 3 children from previous marriage, 1 in [place], 1 in [place], 1 in prison ([place]).    Lives with husband.           FAMILY HISTORY: Family History  Problem Relation Age of Onset  . Diabetes Mother   . Multiple myeloma Mother 57  . Hearing loss Mother   . Diabetes Sister   . Diabetes Brother   . Cancer Brother 40    prostate cancer   . Diabetes Son   . Diabetes Maternal Aunt   . Leukemia Maternal Aunt     dx in her 45s  . Alcoholism Brother     ALLERGIES:  has No Known Allergies.  MEDICATIONS:  Current Outpatient Prescriptions  Medication Sig Dispense Refill  . ACCU-CHEK FASTCLIX LANCETS MISC 1 Units by Percutaneous route 4 (four) times daily. 100 each 12  . acetaminophen (TYLENOL) 500 MG tablet Take 500 mg by mouth every 6 (six) hours as needed.    . Ascorbic Acid (VITAMIN C) 1000 MG tablet Take 1,000 mg by mouth daily.    Marland Kitchen aspirin 325 MG tablet Take 325 mg by mouth once.    Marland Kitchen atorvastatin (LIPITOR) 40 MG tablet Take 1 tablet (40 mg total) by mouth daily. 90 tablet 3  . Blood Glucose Monitoring Suppl (ACCU-CHEK NANO SMARTVIEW) W/DEVICE KIT 1 kit by Subdermal route as directed. Check blood sugars for fasting, and two hours after breakfast, lunch and dinner (4 checks daily) 1 kit 0  . Docusate Sodium (DSS) 100 MG CAPS Take 100 mg by mouth 2 (two) times daily. 30 each 3  . gabapentin (NEURONTIN) 300 MG capsule 300 mg during the day, 600 mg at night by mouth 270 capsule 1  . glucose blood (ACCU-CHEK SMARTVIEW) test strip 1 each by Other route 3 (three) times daily. ICD 10 E11.9 100 each 12  . hyaluronate sodium (RADIAPLEXRX) GEL Apply 1 application topically 2 (two) times daily. Apply to affected rad tx area 2x day,after rad tx  and bedtime or in am ,just not 4 hours prior to treatment daily    . hydrochlorothiazide  (HYDRODIURIL) 25 MG tablet Take 1 tablet (25 mg total) by mouth daily. 90 tablet 3  . Lancets (ACCU-CHEK MULTICLIX) lancets 1 each by Other route 3 (three) times daily. ICD 10 E11.9 100 each 12  . lisinopril-hydrochlorothiazide (PRINZIDE,ZESTORETIC) 20-25 MG per tablet Take 1 tablet by mouth daily. 90 tablet 3  . magnesium hydroxide (MILK OF MAGNESIA) 800 MG/5ML suspension Take 30 mLs by mouth every evening. For next 3-5 days 240 mL 0  . metFORMIN (GLUCOPHAGE) 500 MG tablet Take 1 tablet (500 mg total) by mouth 2 (two) times daily. 180 tablet 3  . methocarbamol (ROBAXIN) 500 MG tablet Take 1 tablet (500 mg total) by mouth at bedtime as needed for muscle spasms. 30 tablet  1  . Multiple Vitamins-Minerals (MULTIVITAMIN WITH MINERALS) tablet Take 1 tablet by mouth daily.    . non-metallic deodorant Jethro Poling) MISC Apply 1 application topically daily as needed.    . ondansetron (ZOFRAN) 8 MG tablet Take 1 tablet (8 mg total) by mouth every 8 (eight) hours as needed for nausea or vomiting. 30 tablet 0  . traMADol (ULTRAM) 50 MG tablet Take 0.5-1 tablets (25-50 mg total) by mouth every 8 (eight) hours as needed. 90 tablet 0  . vitamin D, CHOLECALCIFEROL, 400 UNITS tablet Take 400 Units by mouth daily.     No current facility-administered medications for this visit.    REVIEW OF SYSTEMS:   Constitutional: Denies fevers, chills or abnormal night sweats, (+) 20 lbs weight loss since 8/15, low appetite  Eyes: (+) blurriness of vision, no double vision or watery eyes Ears, nose, mouth, throat, and face: Denies mucositis or sore throat Respiratory: (+) dyspnea on exertion, Denies cough or wheezes Cardiovascular: Denies palpitation, chest discomfort or lower extremity swelling Gastrointestinal:  Denies nausea, heartburn or change in bowel habits Skin: Denies abnormal skin rashes Lymphatics: Denies new lymphadenopathy or easy bruising Neurological:Denies numbness, tingling or new weaknesses Behavioral/Psych:  Mood is stable, no new changes  All other systems were reviewed with the patient and are negative.  PHYSICAL EXAMINATION: ECOG PERFORMANCE STATUS: 1 - Symptomatic but completely ambulatory  Filed Vitals:   06/06/14 0919  BP: 123/76  Pulse: 80  Temp: 98.3 F (36.8 C)  Resp: 18   Filed Weights   06/06/14 0919  Weight: 237 lb 1.6 oz (107.548 kg)    GENERAL:alert, no distress and comfortable SKIN: skin color, texture, turgor are normal, no rashes or significant lesions EYES: normal, conjunctiva are pink and non-injected, sclera clear OROPHARYNX:no exudate, no erythema and lips, buccal mucosa, and tongue normal  NECK: supple, thyroid normal size, non-tender, without nodularity LYMPH:  no palpable lymphadenopathy in the cervical, axillary or inguinal LUNGS: clear to auscultation and percussion with normal breathing effort HEART: regular rate & rhythm and no murmurs and no lower extremity edema ABDOMEN:abdomen soft, (+) mild tenderness at epigastric and left lobe quadrant area, no rebound pain normal bowel sounds Musculoskeletal:no cyanosis of digits and no clubbing  PSYCH: alert & oriented x 3 with fluent speech NEURO: no focal motor/sensory deficits Breasts: Breast inspection showed them to be symmetrical with no nipple discharge. Surgical scar at the left upper breast and axillary area, healing well without surrounding erythema or discharge. Diffuse skin erythema in the left breast, likely secondary to radiation.  Palpation of the right breasts and axilla revealed no obvious mass that I could appreciate.   LABORATORY DATA:  I have reviewed the data as listed Lab Results  Component Value Date   WBC 9.3 04/11/2014   HGB 12.7 04/11/2014   HCT 39.8 04/11/2014   MCV 87.7 04/11/2014   PLT 297 04/11/2014    Recent Labs  09/20/13 0830 09/21/13 0531 12/27/13 1305 02/07/14 0917 04/11/14 0855  NA 135* 137 141 143 140  K 3.5* 3.9 3.7 4.1 4.1  CL 92* 94* 100  --   --   CO2 29  33* 27 30* 28  GLUCOSE 154* 145* 112* 133 156*  BUN _0 15.3 25.8  CREATININE 0.86 0.78 0.80 0.9 1.1  CALCIUM 9.2 7.8* 9.9 9.8 9.7  GFRNONAA 70* 87* 77*  --   --   GFRAA 82* >90 89*  --   --   PROT  --  5.8*  8.3 7.5 7.4  ALBUMIN  --  2.5* 3.9 3.7 3.7  AST  --  19 65* 43* 48*  ALT  --  21 58* 49 51  ALKPHOS  --  52 67 66 58  BILITOT  --  0.6 0.4 0.59 0.41   PATHOLOGY REPORT Diagnosis 12/29/2013 1. Breast, lumpectomy, left - INVASIVE GRADE II DUCTAL CARCINOMA, SPANNING 1.7 CM IN GREATEST DIMENSION. - SECOND FOCUS OF INVASIVE GRADE I DUCTAL CARCINOMA, SPANNING 0.5 CM IN GREATEST DIMENSION. - INTERMEDIATE GRADE DUCTAL CARCINOMA IN SITU ASSOCIATED WITH BOTH FOCI OF TUMOR. - INVASIVE DUCTAL CARCINOMA IS EXTREMELY CLOSE (LESS THAN 0.1 CM) TO INFERIOR MARGIN. - DUCTAL CARCINOMA IN SITU IS CLOSE (0.1 CM) TO INFERIOR MARGIN. - OTHER MARGINS NEGATIVE. - SEE ONCOLOGY TEMPLATE. 2. Lymph node, sentinel, biopsy, Left axillary 1 of 4 FINAL for LUCY, BOARDMAN ([id number]) Diagnosis(continued) - ONE BENIGN LYMPH NODE WITH NO TUMOR SEEN (0/1). 3. Lymph node, sentinel, biopsy, Left axilla - ONE BENIGN LYMPH NODE WITH NO TUMOR SEEN (0/1). Microscopic Comment 1. BREAST, INVASIVE TUMOR, WITH LYMPH NODES PRESENT Specimen, including laterality and lymph node sampling (sentinel, non-sentinel): Left partial breast with left sentinel lymph node sampling. Procedure: Left breast lumpectomy with sentinel lymph node biopsies. Histologic type: Invasive ductal carcinoma. Larger focus: Grade: II. Tubule formation: 2. Nuclear pleomorphism: 2. Mitotic: 3. Smaller focus (near inferior margin): Grade: I. Tubule formation: 1. Nuclear pleomorphism: 2. Mitotic: 1. Tumor sizes (gross and glass slide measurement): 1.7 cm and 0.5 cm. Margins: Invasive, distance to closest margin: less than 0.1 cm (inferior margin). In-situ, distance to closest margin: 0.1 cm (inferior margin). Lymphovascular  invasion: Definitive lymphovascular invasion is not identified. Ductal carcinoma in situ: Yes. Grade: Intermediate grade. Extensive intraductal component: There is an extensive intraductal component of ductal carcinoma in situ on the smaller focus but not on the larger focus. Lobular neoplasia: No. Tumor focality: Two foci, multifocal. Treatment effect: N/A. Extent of tumor: Tumor confined to breast parenchyma. Skin: Not received. Nipple: Not received. Skeletal muscle: Not received. Lymph nodes: Examined: 2 Sentinel. 0 Non-sentinel. 2 Total. Lymph nodes with metastasis: 0. Breast prognostic profile: Performed on previous case ([id number]). Estrogen receptor: 100%, positive. Progesterone receptor: 96%, positive. Her 2 neu by CISH: 0.81 ratio, no amplification. Ki-67: 73%. Non-neoplastic breast: Fat necrosis. TNM: pT1c(m), pN0, MX. Comments: A smooth muscle myosin, calponin, and p63 immunohistochemical stain are performed on a single block, which helps to confirm the presence of a second focus of invasive ductal carcinoma associated 2 of 4 FINAL for WEALTHY, DANIELSKI ([id number]) Microscopic Comment(continued) with ductal carcinoma in situ. As Her-2 neu by CISH was negative on the initial biopsy, this will be repeated on the larger tumor focus and reported in an addendum to follow. A breast prognostic profile will not be performed on the smaller tumor focus unless otherwise requested (the tumor foci although different grades show morphologically similar nuclear features). (RH:ds 12/31/13)  Oncotype DX Score: 23 (Intermediate risk) (ER+/PR+/HER2-)  RADIOGRAPHIC STUDIES: I have personally reviewed the radiological images as listed and agreed with the findings in the report.   MM digital Diagnostic and [place] 11/23/13 Ultrasound is performed, showing angulated hypoechoic mass at the left breast 10 o'clock 18 cm from nipple measuring 1.82 x 0.71 x 0.88 cm. Ultrasound of the  left axilla is negative.   ASSESSMENT & PLAN:  64 year old Caucasian female, with past medical history of hypertension, diabetes, dyslipidemia, obesity who was found to have a left breast mass by screening mammogram.  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 Grade 2, Ki-67 73%, status post lumpectomy with close margin. -She is status post complete surgical resection with lumpectomy. -Her breast cancer is likely cured by surgery alone, but does has some risk of disease recurrence in the future. -I discussed the Oncotype DX result with her and her husband. The recurrence score is 23, with the predicted 10 year recurrence risk of 15% with tamoxifen alone. The benefit of chemotherapy is uncertain in this intermediate risk group. I do not strongly recommend adjuvant chemotherapy. Patient agree with not having chemotherapy.  -Due to the strong positivity of ER and PR, I recommend adjuvant hormonal therapy to reduce the risk of cancer recurrence in the future, which includes local and distant recurrence. Given her post menopause status, I recommend aromatase inhibitor, such as Aromasin, for total 5 years. Potential side effects, she includes but not limited to, hot flash, skiing and vaginal dryness, muscular and joint discomfort and stiffness, osteoporosis, cardiovascular side effects, we'll discussed with patient in great detail. She agrees to proceed. -I sent a prescription of Aromasin to her pharmacy today, she will start after she picks up.  #2 Bone health -Her recent bone density scan was normal. -We discussed Aromasin may weak her bone, I encouraged her to take calcium and vitamin D.  #3. Abdominal pain -Possibly related to constipation -She never had a colonoscopy, I strongly recommend her to have a colonoscopy if her abdominal pain doesn't resolve in a few weeks. -She'll continue follow-up with her primary care physician Dr.  Adrian Blackwater   #4 hypertension, diabetes, dyslipidemia, chronic low back pain -She will continue follow-up with her primary care physician.  #5 Morbid obesity -We discussed healthy diet and regular exercise. She is willing to try.   PLAN: -Start Aromasin in a few days. I sent a prescription to your pharmacy today -Follow up with Dr. Adrian Blackwater for her abdominal pain, I strongly encouraged her to have a colonoscopy if the pain persists. -Return to clinic in 1 months. Lab today and next visit.  All questions were answered. The patient knows to call the clinic with any problems, questions or concerns. I spent 20 minutes counseling the patient face to face. The total time spent in the appointment was 25 minutes and more than 50% was on counseling.     Truitt Merle, MD 06/06/2014 9:28 AM

## 2014-06-20 ENCOUNTER — Telehealth: Payer: Self-pay | Admitting: *Deleted

## 2014-06-20 NOTE — Telephone Encounter (Signed)
SINCE PT. MAY QUALIFY FOR THE INITIAL PROGRAM IT MAY PAY FOR THE COST OF TESTING. MARGARET WOULD LIKE DR.FENG'S NURSE TO CALL HER AT [phone].

## 2014-06-21 NOTE — Telephone Encounter (Signed)
Phoned [facility] spoke with customer service & they will fax a hardship form to see if pt qualifies for free testing.  The MD will have to sign form stating pt is in economic hardship.  Form received & sent to [facility] to get filled ou.

## 2014-06-23 ENCOUNTER — Encounter: Payer: Self-pay | Admitting: Radiation Oncology

## 2014-06-24 ENCOUNTER — Telehealth: Payer: Self-pay | Admitting: *Deleted

## 2014-06-24 ENCOUNTER — Encounter: Payer: Self-pay | Admitting: Hematology

## 2014-06-24 NOTE — Telephone Encounter (Signed)
TC from [facility] on status form they fax'd over for Dr. Burr Medico to sign on 06/21/14

## 2014-06-24 NOTE — Progress Notes (Signed)
I placed form from [facility] on desk of nurse for dr. Burr Medico.

## 2014-06-27 ENCOUNTER — Encounter: Payer: Self-pay | Admitting: Hematology

## 2014-06-27 NOTE — Progress Notes (Signed)
I faxed the [facility] form to [phone]

## 2014-06-28 ENCOUNTER — Telehealth: Payer: Self-pay | Admitting: Family Medicine

## 2014-06-28 DIAGNOSIS — M4726 Other spondylosis with radiculopathy, lumbar region: Secondary | ICD-10-CM

## 2014-06-28 MED ORDER — TRAMADOL HCL 50 MG PO TABS: 25.0000 mg | ORAL_TABLET | Freq: Three times a day (TID) | ORAL | Status: DC | PRN

## 2014-06-28 NOTE — Telephone Encounter (Signed)
Busy line x2  Rx at front office

## 2014-06-28 NOTE — Telephone Encounter (Signed)
Pt called requesting medication refill on traMADol (ULTRAM) 50 MG tablet. Please f/u with patient

## 2014-06-28 NOTE — Telephone Encounter (Signed)
Tramadol refilled.

## 2014-06-30 ENCOUNTER — Other Ambulatory Visit: Payer: Self-pay | Admitting: *Deleted

## 2014-06-30 ENCOUNTER — Ambulatory Visit: Admission: RE | Admit: 2014-06-30 | Payer: Medicaid Other | Source: Ambulatory Visit | Admitting: Radiation Oncology

## 2014-06-30 DIAGNOSIS — E1165 Type 2 diabetes mellitus with hyperglycemia: Secondary | ICD-10-CM

## 2014-06-30 HISTORY — DX: Personal history of irradiation: Z92.3

## 2014-07-01 ENCOUNTER — Encounter: Payer: Self-pay | Admitting: Radiation Oncology

## 2014-07-01 ENCOUNTER — Ambulatory Visit
Admission: RE | Admit: 2014-07-01 | Discharge: 2014-07-01 | Disposition: A | Discharge status: Home / Self Care | Payer: Medicaid Other | Source: Ambulatory Visit | Attending: Radiation Oncology | Admitting: Radiation Oncology

## 2014-07-01 VITALS — BP 104/48 | HR 83 | Temp 98.1°F | Resp 20 | Ht 66.0 in | Wt 235.0 lb

## 2014-07-01 DIAGNOSIS — C50212 Malignant neoplasm of upper-inner quadrant of left female breast: Secondary | ICD-10-CM

## 2014-07-01 NOTE — Progress Notes (Addendum)
Follow up s/p left breast rad txs 04/05/14-05/20/14, breast well healed, c/o sob and when working in the yard 10 minutes starts having chest pain and through to shoulder pains, then pateint stops and rests, no chest pain at present, very fatigued,  Appetite good, c/o right arm pain above elbow, hard to lift up stated,takes Aromasin 25 mg daily  9:45 AM BP 104/48 mmHg  Pulse 83  Temp(Src) 98.1 F (36.7 C) (Oral)  Resp 20  Ht 5\' 6"  (1.676 m)  Wt 235 lb (106.595 kg)  BMI 37.95 kg/m2  Wt Readings from Last 3 Encounters:  06/06/14 237 lb 1.6 oz (107.548 kg)  06/02/14 235 lb (106.595 kg)  05/20/14 233 lb (105.688 kg)

## 2014-07-01 NOTE — Progress Notes (Signed)
Radiation Oncology         [phone] ________________________________  Name: Rachel Herman MRN: [id number]  Date: 07/01/2014  DOB: March 07, 1950  Follow-Up Visit Note  CC: Minerva Ends, MD  Erroll Luna, MD  Diagnosis:   Left-sided breast cancer  Interval Since Last Radiation:  Approximately one month   Narrative:  The patient returns today for routine follow-up.  She has done well overall since she finished treatment. The patient's skin has healed significantly since she completed her course of radiation treatment. She has begun anti-hormonal treatment.                              ALLERGIES:  has No Known Allergies.  Meds: Current Outpatient Prescriptions  Medication Sig Dispense Refill  . acetaminophen (TYLENOL) 500 MG tablet Take 500 mg by mouth every 6 (six) hours as needed.    . Ascorbic Acid (VITAMIN C) 1000 MG tablet Take 1,000 mg by mouth daily.    Marland Kitchen aspirin 325 MG tablet Take 325 mg by mouth once.    Marland Kitchen atorvastatin (LIPITOR) 40 MG tablet Take 1 tablet (40 mg total) by mouth daily. 90 tablet 3  . Blood Glucose Monitoring Suppl (ACCU-CHEK NANO SMARTVIEW) W/DEVICE KIT 1 kit by Subdermal route as directed. Check blood sugars for fasting, and two hours after breakfast, lunch and dinner (4 checks daily) 1 kit 0  . Docusate Sodium (DSS) 100 MG CAPS Take 100 mg by mouth 2 (two) times daily. 30 each 3  . exemestane (AROMASIN) 25 MG tablet Take 1 tablet (25 mg total) by mouth daily after breakfast. 30 tablet 3  . glucose blood (ACCU-CHEK SMARTVIEW) test strip 1 each by Other route 3 (three) times daily. ICD 10 E11.9 100 each 12  . hydrochlorothiazide (HYDRODIURIL) 25 MG tablet Take 1 tablet (25 mg total) by mouth daily. 90 tablet 3  . Lancets (ACCU-CHEK MULTICLIX) lancets 1 each by Other route 3 (three) times daily. ICD 10 E11.9 100 each 12  . lisinopril-hydrochlorothiazide (PRINZIDE,ZESTORETIC) 20-25 MG per tablet Take 1 tablet by mouth daily. 90 tablet 3  . magnesium  hydroxide (MILK OF MAGNESIA) 800 MG/5ML suspension Take 30 mLs by mouth every evening. For next 3-5 days 240 mL 0  . metFORMIN (GLUCOPHAGE) 500 MG tablet Take 1 tablet (500 mg total) by mouth 2 (two) times daily. 180 tablet 3  . methocarbamol (ROBAXIN) 500 MG tablet Take 1 tablet (500 mg total) by mouth at bedtime as needed for muscle spasms. 30 tablet 1  . Multiple Vitamins-Minerals (MULTIVITAMIN WITH MINERALS) tablet Take 1 tablet by mouth daily.    . ondansetron (ZOFRAN) 8 MG tablet Take 1 tablet (8 mg total) by mouth every 8 (eight) hours as needed for nausea or vomiting. 30 tablet 0  . traMADol (ULTRAM) 50 MG tablet Take 0.5-1 tablets (25-50 mg total) by mouth every 8 (eight) hours as needed. 60 tablet 2  . vitamin D, CHOLECALCIFEROL, 400 UNITS tablet Take 400 Units by mouth daily.    Marland Kitchen ACCU-CHEK FASTCLIX LANCETS MISC 1 Units by Percutaneous route 4 (four) times daily. 100 each 12  . gabapentin (NEURONTIN) 300 MG capsule 300 mg during the day, 600 mg at night by mouth 270 capsule 1  . hyaluronate sodium (RADIAPLEXRX) GEL Apply 1 application topically 2 (two) times daily. Apply to affected rad tx area 2x day,after rad tx  and bedtime or in am ,just not 4 hours prior to  treatment daily    . non-metallic deodorant (ALRA) MISC Apply 1 application topically daily as needed.     No current facility-administered medications for this encounter.    Physical Findings: The patient is in no acute distress. Patient is alert and oriented.  height is _0  (1.676 m) and weight is 235 lb (106.595 kg). Her oral temperature is 98.1 F (36.7 C). Her blood pressure is 104/48 and her pulse is 83. Her respiration is 20. .   The skin in the treatment area has healed satisfactorily, no areas of concern/moist desquamation/poor healing  Lab Findings: Lab Results  Component Value Date   WBC 8.4 06/06/2014   HGB 12.5 06/06/2014   HCT 38.1 06/06/2014   MCV 85.1 06/06/2014   PLT 265 06/06/2014      Radiographic Findings: Dg Abd 2 Views  06/03/2014   CLINICAL DATA:  Three weeks of periumbilical abdominal pain, also abdominal distension ; morbid obesity.  EXAM: ABDOMEN - 2 VIEW  COMPARISON:  CT scan of the abdomen and pelvis of August 10, 2013  FINDINGS: There is increased stool burden within the colon. There is no large or small bowel obstruction. There are no abnormal soft tissue calcifications. The bony structures exhibit no acute abnormalities. There are mild degenerative changes of the right hip. The lung bases are clear.  IMPRESSION: There is no acute intra-abdominal abnormality. Increased colonic stool burden may reflect constipation in the appropriate clinical setting.   Electronically Signed   By: David  Martinique M.D.   On: 06/03/2014 09:35    Impression:    The patient has done satisfactorily since finishing treatment. She has begun anti-hormonal treatment.  Plan:  The patient will followup in our clinic on a when necessary basis.   Jodelle Gross, M.D., Ph.D.

## 2014-07-07 ENCOUNTER — Telehealth: Payer: Self-pay | Admitting: Hematology

## 2014-07-07 ENCOUNTER — Ambulatory Visit (HOSPITAL_BASED_OUTPATIENT_CLINIC_OR_DEPARTMENT_OTHER): Payer: Medicaid Other | Admitting: Hematology

## 2014-07-07 ENCOUNTER — Other Ambulatory Visit (HOSPITAL_BASED_OUTPATIENT_CLINIC_OR_DEPARTMENT_OTHER): Payer: Medicaid Other

## 2014-07-07 VITALS — BP 118/66 | HR 73 | Temp 97.9°F | Resp 20 | Ht 66.0 in | Wt 234.8 lb

## 2014-07-07 DIAGNOSIS — C50212 Malignant neoplasm of upper-inner quadrant of left female breast: Secondary | ICD-10-CM | POA: Diagnosis not present

## 2014-07-07 DIAGNOSIS — C50912 Malignant neoplasm of unspecified site of left female breast: Secondary | ICD-10-CM

## 2014-07-07 DIAGNOSIS — Z170 Estrogen receptor positive status [ER+]: Secondary | ICD-10-CM

## 2014-07-07 DIAGNOSIS — Z79811 Long term (current) use of aromatase inhibitors: Secondary | ICD-10-CM | POA: Diagnosis not present

## 2014-07-07 LAB — CBC WITH DIFFERENTIAL/PLATELET
BASO%: 0.7 % (ref 0.0–2.0)
Basophils Absolute: 0 10*3/uL (ref 0.0–0.1)
EOS%: 2.6 % (ref 0.0–7.0)
Eosinophils Absolute: 0.2 10*3/uL (ref 0.0–0.5)
HCT: 37.1 % (ref 34.8–46.6)
HGB: 12.2 g/dL (ref 11.6–15.9)
LYMPH%: 24.7 % (ref 14.0–49.7)
MCH: 28.4 pg (ref 25.1–34.0)
MCHC: 32.7 g/dL (ref 31.5–36.0)
MCV: 86.7 fL (ref 79.5–101.0)
MONO#: 0.5 10*3/uL (ref 0.1–0.9)
MONO%: 8.2 % (ref 0.0–14.0)
NEUT#: 4.2 10*3/uL (ref 1.5–6.5)
NEUT%: 63.8 % (ref 38.4–76.8)
Platelets: 259 10*3/uL (ref 145–400)
RBC: 4.28 10*6/uL (ref 3.70–5.45)
RDW: 14.7 % — ABNORMAL HIGH (ref 11.2–14.5)
WBC: 6.6 10*3/uL (ref 3.9–10.3)
lymph#: 1.6 10*3/uL (ref 0.9–3.3)

## 2014-07-07 LAB — COMPREHENSIVE METABOLIC PANEL (CC13)
ALT: 56 U/L — ABNORMAL HIGH (ref 0–55)
AST: 35 U/L — ABNORMAL HIGH (ref 5–34)
Albumin: 3.4 g/dL — ABNORMAL LOW (ref 3.5–5.0)
Alkaline Phosphatase: 51 U/L (ref 40–150)
Anion Gap: 10 mEq/L (ref 3–11)
BUN: 20.5 mg/dL (ref 7.0–26.0)
CO2: 27 mEq/L (ref 22–29)
Calcium: 9.3 mg/dL (ref 8.4–10.4)
Chloride: 104 mEq/L (ref 98–109)
Creatinine: 1.2 mg/dL — ABNORMAL HIGH (ref 0.6–1.1)
EGFR: 46 mL/min/{1.73_m2} — ABNORMAL LOW (ref >90)
Glucose: 108 mg/dl (ref 70–140)
Potassium: 4.2 mEq/L (ref 3.5–5.1)
Sodium: 141 mEq/L (ref 136–145)
Total Bilirubin: 0.44 mg/dL (ref 0.20–1.20)
Total Protein: 7.2 g/dL (ref 6.4–8.3)

## 2014-07-07 NOTE — Progress Notes (Signed)
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 Ultrasound of the left axilla is negative.      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    12/29/2013 Surgery Left lumpectomy with close (<0.1cm) inferior margin   03/02/2014 Surgery Reexcision for close margin, path negative for malignancy.   04/05/2014 - 05/20/2014 Radiation Therapy adjuvant breast radiation    05/27/2014 Imaging Bone density scan: normal    CURRENT THERAPY: Exemestane 72m daily, started on 06/07/2014                                    INTERIM HISTORY: She returns for follow up. She started exemestane about 4 weeks ago, she has been tolerating it very well, no significant hot flush, no worsening of her chronic knee pain, or other new joint pain. She has good appetite and energy level, her abdominal pain has resolved. No other new complains.                                              MEDICAL HISTORY:  Past Medical History  Diagnosis Date  . Obesity, morbid, BMI 40.0-49.9   . Hypertension 2014  . Hyperlipidemia   . PONV (postoperative nausea and vomiting)   . Breast cancer 12/09/13    left breast Invasive Ductal Carcinoma  . Diabetes mellitus 09/16/13    Diagnosed on 09/16/13; HgA1C was 7.2  . S/P radiation therapy 04/05/14-05/20/14    left breast 60.4Gy totaldose    SURGICAL HISTORY: Past Surgical History  Procedure Laterality Date  . Abdominal hysterectomy N/A 09/20/2013    Procedure: HYSTERECTOMY ABDOMINAL;  Surgeon: UOsborne Oman MD;  [facility];  Service: Gynecology;  Laterality: N/A;  . Salpingoophorectomy  09/20/2013    Procedure: SALPINGO OOPHORECTOMY;  Surgeon: UOsborne Oman MD;  [facility];  Service: Gynecology;;  . Laparoscopic assisted vaginal hysterectomy  09/20/2013    Procedure: LAPAROSCOPIC ASSISTED VAGINAL HYSTERECTOMY;  Surgeon: UOsborne Oman MD;  [facility];  Service: Gynecology;;  PT WAS EXAMINED WHILE UP IN [place] AND IT WAS DECIDED TO OPEN PT DUE TO LARGE MASS  . Breast lumpectomy with axillary lymph node biopsy Left 12/29/13    left breast   .  Radioactive seed guided mastectomy with axillary sentinel lymph node biopsy Left 12/29/2013    Procedure: LEFT BREAST RADIOACTIVE SEED LOCALIZED LUMPECTOMY WITH SENTINEL LYMPH NODE MAPPING;  Surgeon: Erroll Luna, MD;  Location: [facility];  Service: General;  Laterality: Left;  . Re-excision of breast lumpectomy Left 03/02/2014    Procedure: RE-EXCISION OF LEFT BREAST LUMPECTOMY;  Surgeon: Erroll Luna, MD;  Location: [facility];  Service: General;  Laterality: Left;    SOCIAL HISTORY: History   Social History  . Marital Status: Married    Spouse Name: Luwana Butrick   . Number of Children: 3  . Years of Education: 12   Occupational History  .  Unemployed   Social History Main Topics  . Smoking status: Never Smoker   .  Smokeless tobacco: Never Used  . Alcohol Use: No  . Drug Use: No  . Sexual Activity: Not Currently    Birth Control/ Protection: Post-menopausal   Other Topics Concern  . Not on file   Social History Narrative   Married to [employer] in 1986.    Has 3 children from previous marriage, 1 in [place], 1 in [place], 1 in prison ([place]).    Lives with husband.           FAMILY HISTORY: Family History  Problem Relation Age of Onset  . Diabetes Mother   . Multiple myeloma Mother 3  . Hearing loss Mother   . Diabetes Sister   . Diabetes Brother   . Cancer Brother 40    prostate cancer   . Diabetes Son   . Diabetes Maternal Aunt   . Leukemia Maternal Aunt     dx in her 71s  . Alcoholism Brother     ALLERGIES:  has No Known Allergies.  MEDICATIONS:  Current Outpatient Prescriptions  Medication Sig Dispense Refill  . ACCU-CHEK FASTCLIX LANCETS MISC 1 Units by Percutaneous route 4 (four) times daily. 100 each 12  . acetaminophen (TYLENOL) 500 MG tablet Take 500 mg by mouth every 6 (six) hours as needed.    . Ascorbic Acid (VITAMIN C) 1000 MG tablet Take 1,000 mg by mouth daily.    Marland Kitchen aspirin 325 MG tablet Take 325 mg by mouth once.    Marland Kitchen atorvastatin (LIPITOR) 40 MG tablet Take 1 tablet (40 mg total) by mouth daily. 90 tablet 3  . Blood Glucose Monitoring Suppl (ACCU-CHEK NANO SMARTVIEW) W/DEVICE KIT 1 kit by Subdermal route as directed. Check blood sugars for fasting, and two hours after breakfast, lunch and dinner (4 checks daily) 1 kit 0  . Docusate Sodium (DSS) 100 MG CAPS Take 100 mg by mouth 2 (two) times daily. 30 each 3  . exemestane (AROMASIN) 25 MG tablet Take 1 tablet (25 mg total) by mouth daily after breakfast. 30 tablet 3  . gabapentin (NEURONTIN) 300 MG capsule 300 mg during the day, 600 mg at night by mouth 270 capsule 1  . glucose blood (ACCU-CHEK SMARTVIEW) test strip 1 each by Other route 3 (three) times daily. ICD 10 E11.9 100 each 12  . hyaluronate  sodium (RADIAPLEXRX) GEL Apply 1 application topically 2 (two) times daily. Apply to affected rad tx area 2x day,after rad tx  and bedtime or in am ,just not 4 hours prior to treatment daily    . hydrochlorothiazide (HYDRODIURIL) 25 MG tablet Take 1 tablet (25 mg total) by mouth daily. 90 tablet 3  . Lancets (ACCU-CHEK MULTICLIX) lancets 1 each by Other route  3 (three) times daily. ICD 10 E11.9 100 each 12  . lisinopril-hydrochlorothiazide (PRINZIDE,ZESTORETIC) 20-25 MG per tablet Take 1 tablet by mouth daily. 90 tablet 3  . magnesium hydroxide (MILK OF MAGNESIA) 800 MG/5ML suspension Take 30 mLs by mouth every evening. For next 3-5 days 240 mL 0  . metFORMIN (GLUCOPHAGE) 500 MG tablet Take 1 tablet (500 mg total) by mouth 2 (two) times daily. 180 tablet 3  . methocarbamol (ROBAXIN) 500 MG tablet Take 1 tablet (500 mg total) by mouth at bedtime as needed for muscle spasms. 30 tablet 1  . Multiple Vitamins-Minerals (MULTIVITAMIN WITH MINERALS) tablet Take 1 tablet by mouth daily.    . non-metallic deodorant Jethro Poling) MISC Apply 1 application topically daily as needed.    . ondansetron (ZOFRAN) 8 MG tablet Take 1 tablet (8 mg total) by mouth every 8 (eight) hours as needed for nausea or vomiting. 30 tablet 0  . traMADol (ULTRAM) 50 MG tablet Take 0.5-1 tablets (25-50 mg total) by mouth every 8 (eight) hours as needed. 60 tablet 2  . vitamin D, CHOLECALCIFEROL, 400 UNITS tablet Take 400 Units by mouth daily.     No current facility-administered medications for this visit.    REVIEW OF SYSTEMS:   Constitutional: Denies fevers, chills or abnormal night sweats, (+) 20 lbs weight loss since 8/15, low appetite  Eyes: (+) blurriness of vision, no double vision or watery eyes Ears, nose, mouth, throat, and face: Denies mucositis or sore throat Respiratory: (+) dyspnea on exertion, Denies cough or wheezes Cardiovascular: Denies palpitation, chest discomfort or lower extremity swelling Gastrointestinal:   Denies nausea, heartburn or change in bowel habits Skin: Denies abnormal skin rashes Lymphatics: Denies new lymphadenopathy or easy bruising Neurological:Denies numbness, tingling or new weaknesses Behavioral/Psych: Mood is stable, no new changes  All other systems were reviewed with the patient and are negative.  PHYSICAL EXAMINATION: ECOG PERFORMANCE STATUS: 1 - Symptomatic but completely ambulatory BP 118/66 mmHg  Pulse 73  Temp(Src) 97.9 F (36.6 C) (Oral)  Resp 20  Ht '5\' 6"'  (1.676 m)  Wt 234 lb 12.8 oz (106.505 kg)  BMI 37.92 kg/m2  SpO2 99%  GENERAL:alert, no distress and comfortable SKIN: skin color, texture, turgor are normal, no rashes or significant lesions EYES: normal, conjunctiva are pink and non-injected, sclera clear OROPHARYNX:no exudate, no erythema and lips, buccal mucosa, and tongue normal  NECK: supple, thyroid normal size, non-tender, without nodularity LYMPH:  no palpable lymphadenopathy in the cervical, axillary or inguinal LUNGS: clear to auscultation and percussion with normal breathing effort HEART: regular rate & rhythm and no murmurs and no lower extremity edema ABDOMEN:abdomen soft, nontender, no rebound pain normal bowel sounds Musculoskeletal:no cyanosis of digits and no clubbing  PSYCH: alert & oriented x 3 with fluent speech NEURO: no focal motor/sensory deficits Breasts: Breast inspection showed them to be symmetrical with no nipple discharge. Surgical scar at the left upper breast and axillary area, healing well without surrounding erythema or discharge. Palpation of the right breasts and axilla revealed no obvious mass that I could appreciate.  I have reviewed the data as listed LABORATORY DATA:  CBC Latest Ref Rng 07/07/2014 06/06/2014 04/11/2014  WBC 3.9 - 10.3 10e3/uL 6.6 8.4 9.3  Hemoglobin 11.6 - 15.9 g/dL 12.2 12.5 12.7  Hematocrit 34.8 - 46.6 % 37.1 38.1 39.8  Platelets 145 - 400 10e3/uL 259 265 297    CMP Latest Ref Rng 07/07/2014 06/06/2014  04/11/2014  Glucose 70 - 140 mg/dl 108 127 156(H)  BUN 7.0 - 26.0 mg/dL 20.5 20.0 25.8  Creatinine 0.6 - 1.1 mg/dL 1.2(H) 0.9 1.1  Sodium 136 - 145 mEq/L 141 138 140  Potassium 3.5 - 5.1 mEq/L 4.2 4.3 4.1  Chloride 96 - 112 mEq/L - - -  CO2 22 - 29 mEq/L '27 27 28  ' Calcium 8.4 - 10.4 mg/dL 9.3 9.2 9.7  Total Protein 6.4 - 8.3 g/dL 7.2 7.5 7.4  Total Bilirubin 0.20 - 1.20 mg/dL 0.44 0.49 0.41  Alkaline Phos 40 - 150 U/L 51 63 58  AST 5 - 34 U/L 35(H) 57(H) 48(H)  ALT 0 - 55 U/L 56(H) 71(H) 51     PATHOLOGY REPORT Diagnosis 12/29/2013 1. Breast, lumpectomy, left - INVASIVE GRADE II DUCTAL CARCINOMA, SPANNING 1.7 CM IN GREATEST DIMENSION. - SECOND FOCUS OF INVASIVE GRADE I DUCTAL CARCINOMA, SPANNING 0.5 CM IN GREATEST DIMENSION. - INTERMEDIATE GRADE DUCTAL CARCINOMA IN SITU ASSOCIATED WITH BOTH FOCI OF TUMOR. - INVASIVE DUCTAL CARCINOMA IS EXTREMELY CLOSE (LESS THAN 0.1 CM) TO INFERIOR MARGIN. - DUCTAL CARCINOMA IN SITU IS CLOSE (0.1 CM) TO INFERIOR MARGIN. - OTHER MARGINS NEGATIVE. - SEE ONCOLOGY TEMPLATE. 2. Lymph node, sentinel, biopsy, Left axillary 1 of 4 FINAL for AHMIYA, ABEE ([id number]) Diagnosis(continued) - ONE BENIGN LYMPH NODE WITH NO TUMOR SEEN (0/1). 3. Lymph node, sentinel, biopsy, Left axilla - ONE BENIGN LYMPH NODE WITH NO TUMOR SEEN (0/1). Microscopic Comment 1. BREAST, INVASIVE TUMOR, WITH LYMPH NODES PRESENT Specimen, including laterality and lymph node sampling (sentinel, non-sentinel): Left partial breast with left sentinel lymph node sampling. Procedure: Left breast lumpectomy with sentinel lymph node biopsies. Histologic type: Invasive ductal carcinoma. Larger focus: Grade: II. Tubule formation: 2. Nuclear pleomorphism: 2. Mitotic: 3. Smaller focus (near inferior margin): Grade: I. Tubule formation: 1. Nuclear pleomorphism: 2. Mitotic: 1. Tumor sizes (gross and glass slide measurement): 1.7 cm and 0.5 cm. Margins: Invasive, distance to  closest margin: less than 0.1 cm (inferior margin). In-situ, distance to closest margin: 0.1 cm (inferior margin). Lymphovascular invasion: Definitive lymphovascular invasion is not identified. Ductal carcinoma in situ: Yes. Grade: Intermediate grade. Extensive intraductal component: There is an extensive intraductal component of ductal carcinoma in situ on the smaller focus but not on the larger focus. Lobular neoplasia: No. Tumor focality: Two foci, multifocal. Treatment effect: N/A. Extent of tumor: Tumor confined to breast parenchyma. Skin: Not received. Nipple: Not received. Skeletal muscle: Not received. Lymph nodes: Examined: 2 Sentinel. 0 Non-sentinel. 2 Total. Lymph nodes with metastasis: 0. Breast prognostic profile: Performed on previous case ([id number]). Estrogen receptor: 100%, positive. Progesterone receptor: 96%, positive. Her 2 neu by CISH: 0.81 ratio, no amplification. Ki-67: 73%. Non-neoplastic breast: Fat necrosis. TNM: pT1c(m), pN0, MX. Comments: A smooth muscle myosin, calponin, and p63 immunohistochemical stain are performed on a single block, which helps to confirm the presence of a second focus of invasive ductal carcinoma associated 2 of 4 FINAL for LYDIAN, CHAVOUS ([id number]) Microscopic Comment(continued) with ductal carcinoma in situ. As Her-2 neu by CISH was negative on the initial biopsy, this will be repeated on the larger tumor focus and reported in an addendum to follow. A breast prognostic profile will not be performed on the smaller tumor focus unless otherwise requested (the tumor foci although different grades show morphologically similar nuclear features). (RH:ds 12/31/13)  Oncotype DX Score: 23 (Intermediate risk) (ER+/PR+/HER2-)  RADIOGRAPHIC STUDIES: I have personally reviewed the radiological images as listed and agreed with the findings in the report.   MM digital Diagnostic and [place] 11/23/13  Ultrasound is performed,  showing angulated hypoechoic mass at the left breast 10 o'clock 18 cm from nipple measuring 1.82 x 0.71 x 0.88 cm. Ultrasound of the left axilla is negative.   ASSESSMENT & PLAN:  64 year old Caucasian female, with past medical history of hypertension, diabetes, dyslipidemia, obesity who was found to have a left breast mass by screening mammogram.  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 Grade 2, Ki-67 73%, status post lumpectomy with close margin. -She is status post complete surgical resection with lumpectomy. -Her breast cancer is likely cured by surgery alone, but does has some risk of disease recurrence in the future. -I discussed the Oncotype DX result with her and her husband. The recurrence score is 23, with the predicted 10 year recurrence risk of 15% with tamoxifen alone. The benefit of chemotherapy is uncertain in this intermediate risk group. I do not strongly recommend adjuvant chemotherapy. Patient agree with not having chemotherapy.  -Due to the strong positivity of ER and PR, I recommend adjuvant hormonal therapy to reduce the risk of cancer recurrence in the future, which includes local and distant recurrence. Given her post menopause status, I recommend aromatase inhibitor -She has been tolerating Aromasin well, will continue, plan for a total of 5 years.  -Annual mammogram   #2 Bone health -Her recent bone density scan was normal. -We discussed Aromasin may weak her bone, I encouraged her to take calcium and vitamin D.  #3. hypertension, diabetes, dyslipidemia, chronic low back pain -She will continue follow-up with her primary care physician.  #5 Morbid obesity -We discussed healthy diet and regular exercise. She is willing to try.   PLAN: -continue exemastane -RTC in 3 month for follow up   All questions were answered. The patient knows to call the clinic with any problems, questions or concerns. I  spent 20 minutes counseling the patient face to face. The total time spent in the appointment was 25 minutes and more than 50% was on counseling.     Truitt Merle, MD 07/07/2014 7:54 AM

## 2014-07-07 NOTE — Telephone Encounter (Signed)
Gave adn printed appt sched and avs for pt for SEpt  °

## 2014-07-08 ENCOUNTER — Encounter: Payer: Self-pay | Admitting: Hematology

## 2014-07-08 MED ORDER — EXEMESTANE 25 MG PO TABS: 25.0000 mg | ORAL_TABLET | Freq: Every day | ORAL | Status: DC

## 2014-07-12 ENCOUNTER — Encounter: Payer: Self-pay | Admitting: Family Medicine

## 2014-07-12 ENCOUNTER — Ambulatory Visit: Payer: Medicaid Other | Attending: Family Medicine | Admitting: Family Medicine

## 2014-07-12 VITALS — BP 110/70 | HR 78 | Temp 98.6°F | Resp 16 | Ht 67.0 in | Wt 234.0 lb

## 2014-07-12 DIAGNOSIS — M549 Dorsalgia, unspecified: Secondary | ICD-10-CM | POA: Diagnosis not present

## 2014-07-12 DIAGNOSIS — E785 Hyperlipidemia, unspecified: Secondary | ICD-10-CM | POA: Diagnosis not present

## 2014-07-12 DIAGNOSIS — G8929 Other chronic pain: Secondary | ICD-10-CM | POA: Diagnosis not present

## 2014-07-12 DIAGNOSIS — E119 Type 2 diabetes mellitus without complications: Secondary | ICD-10-CM

## 2014-07-12 DIAGNOSIS — R1033 Periumbilical pain: Secondary | ICD-10-CM | POA: Diagnosis not present

## 2014-07-12 DIAGNOSIS — M791 Myalgia: Secondary | ICD-10-CM | POA: Insufficient documentation

## 2014-07-12 DIAGNOSIS — Z6836 Body mass index (BMI) 36.0-36.9, adult: Secondary | ICD-10-CM | POA: Insufficient documentation

## 2014-07-12 DIAGNOSIS — I10 Essential (primary) hypertension: Secondary | ICD-10-CM | POA: Insufficient documentation

## 2014-07-12 DIAGNOSIS — I152 Hypertension secondary to endocrine disorders: Secondary | ICD-10-CM | POA: Diagnosis not present

## 2014-07-12 DIAGNOSIS — E1169 Type 2 diabetes mellitus with other specified complication: Secondary | ICD-10-CM | POA: Insufficient documentation

## 2014-07-12 DIAGNOSIS — M4726 Other spondylosis with radiculopathy, lumbar region: Secondary | ICD-10-CM | POA: Diagnosis not present

## 2014-07-12 DIAGNOSIS — K5900 Constipation, unspecified: Secondary | ICD-10-CM | POA: Insufficient documentation

## 2014-07-12 LAB — GLUCOSE, POCT (MANUAL RESULT ENTRY): POC Glucose: 143 mg/dl — AB (ref 70–99)

## 2014-07-12 MED ORDER — LISINOPRIL 20 MG PO TABS: 20.0000 mg | ORAL_TABLET | Freq: Every day | ORAL | Status: DC

## 2014-07-12 MED ORDER — ATORVASTATIN CALCIUM 20 MG PO TABS: 40.0000 mg | ORAL_TABLET | Freq: Every day | ORAL | Status: DC

## 2014-07-12 MED ORDER — ACETAMINOPHEN 500 MG PO TABS
500.0000 mg | ORAL_TABLET | Freq: Three times a day (TID) | ORAL | Status: DC | PRN
Start: 2014-07-12 — End: 2014-12-15

## 2014-07-12 MED ORDER — HYDROCHLOROTHIAZIDE 25 MG PO TABS: 25.0000 mg | ORAL_TABLET | Freq: Every day | ORAL | Status: DC

## 2014-07-12 NOTE — Progress Notes (Signed)
   Subjective:    Patient ID: Rachel Herman, female    DOB: 12/03/1950, 64 y.o.   MRN: [id number] CC: f/u abdominal pain  HPI  1. Abdominal pain: improved with milk of magnesia. Stools are soft. Nausea but no emesis. No fever or chills.   2. HLD: in setting of diabetes. Not taking statin due to myalgias which improved off statin. ? Should she take. Has chronic back pain but not achy in shoulders and thighs like she was before.   3. HTN: compliant with regimen. No CP or SOB. No HA. Taking prinzide and HCTZ.   Soc Hx: non smoker  Review of Systems  Constitutional: Positive for fever. Negative for chills.  Respiratory: Negative for shortness of breath.   Cardiovascular: Negative for chest pain.  Gastrointestinal: Positive for nausea. Negative for vomiting, abdominal pain and abdominal distention.  Musculoskeletal: Positive for back pain.       Objective:   Physical Exam BP 110/70 mmHg  Pulse 78  Temp(Src) 98.6 F (37 C) (Oral)  Resp 16  Ht 5\' 7"  (1.702 m)  Wt 234 lb (106.142 kg)  BMI 36.64 kg/m2  SpO2 97% General appearance: alert, cooperative, no distress and morbidly obese Lungs: clear to auscultation bilaterally Heart: regular rate and rhythm, S1, S2 normal, no murmur, click, rub or gallop Abdomen: obese, round, non tender, no mass or rebound        Assessment & Plan:

## 2014-07-12 NOTE — Patient Instructions (Signed)
Rachel Herman,  Thank you for coming in today  1. Blood pressure: well controlled Stop prinzide Take HCTZ in morning Take lisinopril in evening   2. Constipation and abdominal pain: Improved Continue milk of magnesia  3. ? About lipitor: Please continue lipitor at lower dose of 20 mg in the evening. If aches comes back we will decrease to 10 mg  F/u in 2 months for diabetes  Dr. Adrian Blackwater

## 2014-07-13 NOTE — Assessment & Plan Note (Addendum)
Constipation and abdominal pain: Improved Continue milk of magnesia

## 2014-07-13 NOTE — Assessment & Plan Note (Signed)
A: HLD in DM, did not tolerate HI statin, lipitor 40 mg daily P: Please continue lipitor at lower dose of 20 mg in the evening. If aches comes back we will decrease to 10 mg

## 2014-07-13 NOTE — Assessment & Plan Note (Signed)
Blood pressure: well controlled Stop prinzide Take HCTZ in morning Take lisinopril in evening

## 2014-07-19 ENCOUNTER — Telehealth: Payer: Self-pay | Admitting: Family Medicine

## 2014-07-19 NOTE — Telephone Encounter (Signed)
Pt stated BP yesterday was 131/88 p 78 Dizziness going on for 1 week, without any pain Worsen when laying down Advised to go to UC or ER if Sx worsen.  Scheduled appointment with PCP

## 2014-07-19 NOTE — Telephone Encounter (Signed)
Patient called requesting to speak to nurse, pt states she is having dizziness. Please f/u with patient.

## 2014-07-20 ENCOUNTER — Emergency Department (HOSPITAL_COMMUNITY): Payer: Medicaid Other

## 2014-07-20 ENCOUNTER — Telehealth: Payer: Self-pay | Admitting: *Deleted

## 2014-07-20 ENCOUNTER — Emergency Department (HOSPITAL_COMMUNITY)
Admission: EM | Admit: 2014-07-20 | Discharge: 2014-07-20 | Disposition: A | Discharge status: Home / Self Care | Payer: Medicaid Other | Attending: Emergency Medicine | Admitting: Emergency Medicine

## 2014-07-20 ENCOUNTER — Encounter (HOSPITAL_COMMUNITY): Payer: Self-pay | Admitting: *Deleted

## 2014-07-20 DIAGNOSIS — Z923 Personal history of irradiation: Secondary | ICD-10-CM | POA: Insufficient documentation

## 2014-07-20 DIAGNOSIS — R911 Solitary pulmonary nodule: Secondary | ICD-10-CM

## 2014-07-20 DIAGNOSIS — Z853 Personal history of malignant neoplasm of breast: Secondary | ICD-10-CM | POA: Diagnosis not present

## 2014-07-20 DIAGNOSIS — R079 Chest pain, unspecified: Secondary | ICD-10-CM

## 2014-07-20 DIAGNOSIS — I10 Essential (primary) hypertension: Secondary | ICD-10-CM | POA: Diagnosis not present

## 2014-07-20 DIAGNOSIS — R531 Weakness: Secondary | ICD-10-CM | POA: Diagnosis not present

## 2014-07-20 DIAGNOSIS — R1032 Left lower quadrant pain: Secondary | ICD-10-CM | POA: Insufficient documentation

## 2014-07-20 DIAGNOSIS — Z79899 Other long term (current) drug therapy: Secondary | ICD-10-CM | POA: Diagnosis not present

## 2014-07-20 DIAGNOSIS — E785 Hyperlipidemia, unspecified: Secondary | ICD-10-CM | POA: Diagnosis not present

## 2014-07-20 DIAGNOSIS — R109 Unspecified abdominal pain: Secondary | ICD-10-CM

## 2014-07-20 DIAGNOSIS — E119 Type 2 diabetes mellitus without complications: Secondary | ICD-10-CM | POA: Insufficient documentation

## 2014-07-20 DIAGNOSIS — R05 Cough: Secondary | ICD-10-CM | POA: Diagnosis not present

## 2014-07-20 DIAGNOSIS — R059 Cough, unspecified: Secondary | ICD-10-CM

## 2014-07-20 LAB — URINALYSIS, ROUTINE W REFLEX MICROSCOPIC
Bilirubin Urine: NEGATIVE
Glucose, UA: NEGATIVE mg/dL
Hgb urine dipstick: NEGATIVE
Ketones, ur: NEGATIVE mg/dL
Nitrite: NEGATIVE
Protein, ur: NEGATIVE mg/dL
Specific Gravity, Urine: 1.009 (ref 1.005–1.030)
Urobilinogen, UA: 0.2 mg/dL (ref 0.0–1.0)
pH: 5 (ref 5.0–8.0)

## 2014-07-20 LAB — COMPREHENSIVE METABOLIC PANEL
ALT: 46 U/L (ref 14–54)
AST: 38 U/L (ref 15–41)
Albumin: 4.2 g/dL (ref 3.5–5.0)
Alkaline Phosphatase: 51 U/L (ref 38–126)
Anion gap: 11 (ref 5–15)
BUN: 19 mg/dL (ref 6–20)
CO2: 25 mmol/L (ref 22–32)
Calcium: 9.8 mg/dL (ref 8.9–10.3)
Chloride: 103 mmol/L (ref 101–111)
Creatinine, Ser: 1 mg/dL (ref 0.44–1.00)
GFR calc Af Amer: >60 mL/min (ref >60)
GFR calc non Af Amer: 58 mL/min — ABNORMAL LOW (ref >60)
Glucose, Bld: 139 mg/dL — ABNORMAL HIGH (ref 65–99)
Potassium: 3.7 mmol/L (ref 3.5–5.1)
Sodium: 139 mmol/L (ref 135–145)
Total Bilirubin: 0.6 mg/dL (ref 0.3–1.2)
Total Protein: 8.2 g/dL — ABNORMAL HIGH (ref 6.5–8.1)

## 2014-07-20 LAB — CBC WITH DIFFERENTIAL/PLATELET
Basophils Absolute: 0 10*3/uL (ref 0.0–0.1)
Basophils Relative: 0 % (ref 0–1)
Eosinophils Absolute: 0.2 10*3/uL (ref 0.0–0.7)
Eosinophils Relative: 2 % (ref 0–5)
HCT: 41 % (ref 36.0–46.0)
Hemoglobin: 12.8 g/dL (ref 12.0–15.0)
Lymphocytes Relative: 22 % (ref 12–46)
Lymphs Abs: 1.7 10*3/uL (ref 0.7–4.0)
MCH: 28.2 pg (ref 26.0–34.0)
MCHC: 31.2 g/dL (ref 30.0–36.0)
MCV: 90.3 fL (ref 78.0–100.0)
Monocytes Absolute: 0.6 10*3/uL (ref 0.1–1.0)
Monocytes Relative: 7 % (ref 3–12)
Neutro Abs: 5.2 10*3/uL (ref 1.7–7.7)
Neutrophils Relative %: 69 % (ref 43–77)
Platelets: 296 10*3/uL (ref 150–400)
RBC: 4.54 MIL/uL (ref 3.87–5.11)
RDW: 14.4 % (ref 11.5–15.5)
WBC: 7.7 10*3/uL (ref 4.0–10.5)

## 2014-07-20 LAB — URINE MICROSCOPIC-ADD ON

## 2014-07-20 LAB — I-STAT TROPONIN, ED: Troponin i, poc: 0 ng/mL (ref 0.00–0.08)

## 2014-07-20 LAB — I-STAT CG4 LACTIC ACID, ED: Lactic Acid, Venous: 2.42 mmol/L (ref 0.5–2.0)

## 2014-07-20 MED ORDER — IOHEXOL 300 MG/ML  SOLN
50.0000 mL | Freq: Once | INTRAMUSCULAR | Status: AC | PRN
  Administered 2014-07-20: 50 mL via ORAL

## 2014-07-20 MED ORDER — SODIUM CHLORIDE 0.9 % IV SOLN
Freq: Once | INTRAVENOUS | Status: AC
  Administered 2014-07-20: 15:00:00 via INTRAVENOUS

## 2014-07-20 MED ORDER — IOHEXOL 300 MG/ML  SOLN
100.0000 mL | Freq: Once | INTRAMUSCULAR | Status: AC | PRN
  Administered 2014-07-20: 100 mL via INTRAVENOUS

## 2014-07-20 MED ORDER — MECLIZINE HCL 12.5 MG PO TABS: 12.5000 mg | ORAL_TABLET | Freq: Three times a day (TID) | ORAL | Status: DC | PRN

## 2014-07-20 NOTE — ED Notes (Signed)
Patient transported to CT 

## 2014-07-20 NOTE — Discharge Instructions (Signed)
Please call your doctor for a followup appointment within 24-48 hours. When you talk to your doctor please let them know that you were seen in the emergency department and have them acquire all of your records so that they can discuss the findings with you and formulate a treatment plan to fully care for your new and ongoing problems. ° °

## 2014-07-20 NOTE — ED Notes (Signed)
Notified MD Sabra Heck of lactic acid value

## 2014-07-20 NOTE — ED Notes (Signed)
Pt reports current left sided breast cancer, has finished radiology, since may is taking daily chemo pill. Reports abd pain and back pain x3 weeks, has seen pcp about this. Weakness/dizziness x6 days. Left sided breast/chest pain since yesterday. Pain 8/10.

## 2014-07-20 NOTE — Telephone Encounter (Signed)
Returned call to patient that left voice message this am, last f/u 07/01/14 breat had healed, now patient is c/o increased dizzyness when lying down, and under axilla and breast sore and red, asked if she had a fever,"No fever, but dizzyness is getting worse almost like everything is spinning around, she had called  Earlier and was told to go to urgent care or ER if symptoms  Worse, I advised her as wll to either go to urgent care or the ER , and to let let us know if she needs to see Dr. Lisbeth Renshaw  Afterwards for another follow up,will in basket MD, patient said she would go to the ER 10:10 AM

## 2014-07-20 NOTE — ED Provider Notes (Signed)
CSN: [id number]     Arrival date & time 07/20/14  1110 History   First MD Initiated Contact with Patient 07/20/14 1130     Chief Complaint  Patient presents with  . ca pt, chest pain    . Weakness  . Abdominal Pain     (Consider location/radiation/quality/duration/timing/severity/associated sxs/prior Treatment) HPI Comments: The patient is a 64 year old female, she has a history of breast cancer, has completed radiation therapy, is currently taking chemotherapy daily for the last month and a half. She has had some nausea with this but it is daily, unchanged. She reports that over the last 6 days she has had a feeling of abnormal sensation that goes across her lower abdomen down through bilateral legs, this occurs when she lays down, it goes away when she gets up, it is almost always associated with the dizziness which she describes as the room spinning. She denies shortness of breath but has had a mild cough for the last 6 days, left-sided breast pain today only without redness or swelling she has never had any of these symptoms in the past.    Patient is a 64 y.o. female presenting with weakness and abdominal pain. The history is provided by the patient.  Weakness Associated symptoms include abdominal pain.  Abdominal Pain   Past Medical History  Diagnosis Date  . Obesity, morbid, BMI 40.0-49.9   . Hypertension 2014  . Hyperlipidemia   . PONV (postoperative nausea and vomiting)   . S/P radiation therapy 04/05/14-05/20/14    left breast 60.4Gy totaldose  . Diabetes mellitus 09/16/13    Diagnosed on 09/16/13; HgA1C was 7.2/metformin  . Breast cancer 12/09/13    left breast Invasive Ductal Carcinoma   Past Surgical History  Procedure Laterality Date  . Abdominal hysterectomy N/A 09/20/2013    Procedure: HYSTERECTOMY ABDOMINAL;  Surgeon: Osborne Oman, MD;  Location: [facility];  Service: Gynecology;  Laterality: N/A;  . Salpingoophorectomy  09/20/2013    Procedure: SALPINGO OOPHORECTOMY;   Surgeon: Osborne Oman, MD;  Location: [facility];  Service: Gynecology;;  . Laparoscopic assisted vaginal hysterectomy  09/20/2013    Procedure: LAPAROSCOPIC ASSISTED VAGINAL HYSTERECTOMY;  Surgeon: Osborne Oman, MD;  Location: [facility];  Service: Gynecology;;  PT WAS EXAMINED WHILE UP IN [place] AND IT WAS DECIDED TO OPEN PT DUE TO LARGE MASS  . Breast lumpectomy with axillary lymph node biopsy Left 12/29/13    left breast   . Radioactive seed guided mastectomy with axillary sentinel lymph node biopsy Left 12/29/2013    Procedure: LEFT BREAST RADIOACTIVE SEED LOCALIZED LUMPECTOMY WITH SENTINEL LYMPH NODE MAPPING;  Surgeon: Erroll Luna, MD;  Location: [facility];  Service: General;  Laterality: Left;  . Re-excision of breast lumpectomy Left 03/02/2014    Procedure: RE-EXCISION OF LEFT BREAST LUMPECTOMY;  Surgeon: Erroll Luna, MD;  Location: [facility];  Service: General;  Laterality: Left;   Family History  Problem Relation Age of Onset  . Diabetes Mother   . Multiple myeloma Mother 59  . Hearing loss Mother   . Diabetes Sister   . Diabetes Brother   . Cancer Brother 40    prostate cancer   . Diabetes Son   . Diabetes Maternal Aunt   . Leukemia Maternal Aunt     dx in her 64s  . Alcoholism Brother    History  Substance Use Topics  . Smoking status: Never Smoker   . Smokeless tobacco: Never Used  . Alcohol  Use: No   OB History    Gravida Para Term Preterm AB TAB SAB Ectopic Multiple Living   _0 0 0 0 0 0 0 3     Review of Systems  Gastrointestinal: Positive for abdominal pain.  Neurological: Positive for weakness.  All other systems reviewed and are negative.     Allergies  Review of patient's allergies indicates no known allergies.  Home Medications   Prior to Admission medications   Medication Sig Start Date End Date Taking? Authorizing Provider  ACCU-CHEK FASTCLIX LANCETS MISC 1 Units by Percutaneous route 4 (four) times  daily. 09/22/13  Yes Osborne Oman, MD  acetaminophen (KLS NON ASPIRIN) 500 MG tablet Take 500 mg by mouth every 8 (eight) hours as needed.    Yes Historical Provider, MD  acetaminophen (TYLENOL) 500 MG tablet Take 1 tablet (500 mg total) by mouth every 8 (eight) hours as needed. 07/12/14  Yes Boykin Nearing, MD  Ascorbic Acid (VITAMIN C) 1000 MG tablet Take 1,000 mg by mouth daily.   Yes Historical Provider, MD  aspirin 325 MG tablet Take 325 mg by mouth once.   Yes Historical Provider, MD  atorvastatin (LIPITOR) 20 MG tablet Take 2 tablets (40 mg total) by mouth daily. Patient taking differently: Take 20 mg by mouth daily.  07/12/14  Yes Josalyn Funches, MD  Blood Glucose Monitoring Suppl (ACCU-CHEK NANO SMARTVIEW) W/DEVICE KIT 1 kit by Subdermal route as directed. Check blood sugars for fasting, and two hours after breakfast, lunch and dinner (4 checks daily) 09/22/13  Yes Osborne Oman, MD  Docusate Sodium (DSS) 100 MG CAPS Take 100 mg by mouth 2 (two) times daily. Patient taking differently: Take 200 mg by mouth every evening.  09/30/13  Yes Osborne Oman, MD  exemestane (AROMASIN) 25 MG tablet Take 1 tablet (25 mg total) by mouth daily after breakfast. 07/08/14  Yes Truitt Merle, MD  gabapentin (NEURONTIN) 300 MG capsule 300 mg during the day, 600 mg at night by mouth 05/27/14  Yes Josalyn Funches, MD  glucose blood (ACCU-CHEK SMARTVIEW) test strip 1 each by Other route 3 (three) times daily. ICD 10 E11.9 06/02/14  Yes Josalyn Funches, MD  hydrochlorothiazide (HYDRODIURIL) 25 MG tablet Take 1 tablet (25 mg total) by mouth daily. 07/12/14  Yes Josalyn Funches, MD  Lancets (ACCU-CHEK MULTICLIX) lancets 1 each by Other route 3 (three) times daily. ICD 10 E11.9 06/02/14  Yes Josalyn Funches, MD  lisinopril (PRINIVIL,ZESTRIL) 20 MG tablet Take 1 tablet (20 mg total) by mouth daily. 07/12/14  Yes Josalyn Funches, MD  magnesium hydroxide (MILK OF MAGNESIA) 400 MG/5ML suspension Take 30 mLs by mouth as needed  for mild constipation.   Yes Historical Provider, MD  metFORMIN (GLUCOPHAGE) 500 MG tablet Take 1 tablet (500 mg total) by mouth 2 (two) times daily. 03/30/14  Yes Josalyn Funches, MD  methocarbamol (ROBAXIN) 500 MG tablet Take 1 tablet (500 mg total) by mouth at bedtime as needed for muscle spasms. 05/27/14  Yes Josalyn Funches, MD  Multiple Vitamins-Minerals (MULTIVITAMIN WITH MINERALS) tablet Take 1 tablet by mouth daily.   Yes Historical Provider, MD  ondansetron (ZOFRAN) 8 MG tablet Take 1 tablet (8 mg total) by mouth every 8 (eight) hours as needed for nausea or vomiting. 06/02/14  Yes Josalyn Funches, MD  traMADol (ULTRAM) 50 MG tablet Take 0.5-1 tablets (25-50 mg total) by mouth every 8 (eight) hours as needed. 06/28/14  Yes Boykin Nearing, MD  vitamin D, CHOLECALCIFEROL, 400  UNITS tablet Take 400 Units by mouth daily.   Yes Historical Provider, MD  meclizine (ANTIVERT) 12.5 MG tablet Take 1 tablet (12.5 mg total) by mouth 3 (three) times daily as needed for dizziness. 07/20/14   Noemi Chapel, MD   BP 129/77 mmHg  Pulse 100  Temp(Src) 97.4 F (36.3 C) (Oral)  Resp 18  SpO2 97% Physical Exam  Constitutional: She appears well-developed and well-nourished. No distress.  HENT:  Head: Normocephalic and atraumatic.  Mouth/Throat: Oropharynx is clear and moist. No oropharyngeal exudate.  Eyes: Conjunctivae and EOM are normal. Pupils are equal, round, and reactive to light. Right eye exhibits no discharge. Left eye exhibits no discharge. No scleral icterus.  Neck: Normal range of motion. Neck supple. No JVD present. No thyromegaly present.  Cardiovascular: Normal rate, regular rhythm, normal heart sounds and intact distal pulses.  Exam reveals no gallop and no friction rub.   No murmur heard. Pulmonary/Chest: Effort normal and breath sounds normal. No respiratory distress. She has no wheezes. She has no rales. She exhibits tenderness ( chaperone present for exam, left-sided breast tenderness from  the axilla through the inferomedial breast on the left, normal appearing breast tissue, well healed surgical scars, no redness or swelling).  Abdominal: Soft. Bowel sounds are normal. She exhibits no distension and no mass. There is tenderness ( Focal left lower quadrant tenderness, no other abdominal tenderness).  Musculoskeletal: Normal range of motion. She exhibits no edema or tenderness.  Lymphadenopathy:    She has no cervical adenopathy.  Neurological: She is alert. Coordination normal.  Skin: Skin is warm and dry. No rash noted. No erythema.  Psychiatric: She has a normal mood and affect. Her behavior is normal.  Nursing note and vitals reviewed.   ED Course  Procedures (including critical care time) Labs Review Labs Reviewed  COMPREHENSIVE METABOLIC PANEL - Abnormal; Notable for the following:    Glucose, Bld 139 (*)    Total Protein 8.2 (*)    GFR calc non Af Amer 58 (*)    All other components within normal limits  URINALYSIS, ROUTINE W REFLEX MICROSCOPIC (NOT AT [facility]) - Abnormal; Notable for the following:    Leukocytes, UA TRACE (*)    All other components within normal limits  URINE MICROSCOPIC-ADD ON - Abnormal; Notable for the following:    Casts HYALINE CASTS (*)    All other components within normal limits  I-STAT CG4 LACTIC ACID, ED - Abnormal; Notable for the following:    Lactic Acid, Venous 2.42 (*)    All other components within normal limits  CBC WITH DIFFERENTIAL/PLATELET  Randolm Idol, ED    Imaging Review Dg Chest 2 View  07/20/2014   CLINICAL DATA:  Dizziness. Lightheadedness for 3 days. Short of breath. Initial encounter.  EXAM: CHEST  2 VIEW  COMPARISON:  None.  FINDINGS: Cardiopericardial silhouette within normal limits. Mediastinal contours normal. Trachea midline. No airspace disease or effusion. Faintly visible monitoring buttons project over the chest.  IMPRESSION: No active cardiopulmonary disease.   Electronically Signed   By: Dereck Ligas  M.D.   On: 07/20/2014 12:09   Ct Abdomen Pelvis W Contrast  07/20/2014   CLINICAL DATA:  64 year old female with 3 week history of abdominal and back pain as well as dizziness and weakness over the past 6 days. History of left breast cancer diagnosed in October 2015. Chemotherapy still in progress.  EXAM: CT ABDOMEN AND PELVIS WITH CONTRAST  TECHNIQUE: Multidetector CT imaging of the abdomen and  pelvis was performed using the standard protocol following bolus administration of intravenous contrast.  CONTRAST:  160m OMNIPAQUE IOHEXOL 300 MG/ML  SOLN  COMPARISON:  Prior CT abdomen / pelvis 08/10/2013  FINDINGS: Lower Chest: A 4 mm nodule is noted in the periphery of the right lower lobe on image 4 of series 4. This was not definitively seen on the prior imaging although there was atelectasis in the region which could obscured of the nodule. Otherwise, the lung bases are clear. Visualized cardiac structures are within normal limits. No pericardial effusion. Unremarkable distal thoracic esophagus.  Abdomen: Unremarkable imaging appearance of the stomach, duodenum, spleen, adrenal glands and pancreas. Normal hepatic contour and morphology. No discrete hepatic lesion. Gallbladder is unremarkable. No intra or extrahepatic biliary ductal dilatation.  Unremarkable appearance of the bilateral kidneys. No focal solid lesion, hydronephrosis or nephrolithiasis.  Colonic diverticular disease without CT evidence of active inflammation. No free fluid or suspicious adenopathy. Small fat containing umbilical hernia.  Pelvis: Surgical changes of prior hysterectomy and bilateral salpingo oophorectomy. Bladder is unremarkable. Mild pelvic floor laxity. No free fluid or suspicious adenopathy.  Bones/Soft Tissues: No acute fracture or aggressive appearing lytic or blastic osseous lesion.  Vascular: No significant atherosclerotic vascular disease, aneurysmal dilatation or acute abnormality.  IMPRESSION: 1. No acute abnormality in the  abdomen or pelvis to explain the patient's clinical symptoms. 2. Nonspecific 4 mm nodule in the periphery of the right lower lobe. This was not seen on the prior CT scan from July 2015 although there was atelectasis in the region which could obscure to the nodule. Given the known history of breast cancer, short interval followup with repeat chest CT in 3 months is recommended. 3. Colonic diverticular disease without CT evidence of active inflammation.   Electronically Signed   By: HJacqulynn CadetM.D.   On: 07/20/2014 14:36     EKG Interpretation   Date/Time:  Wednesday July 20 2014 11:19:10 EDT Ventricular Rate:  98 PR Interval:  168 QRS Duration: 88 QT Interval:  343 QTC Calculation: 438 R Axis:   67 Text Interpretation:  Ectopic atrial rhythm Low voltage, precordial leads  Borderline repolarization abnormality Baseline wander in lead(s) I II III  aVR aVL Since last tracing different P wave morphology - otherwise  unchanged Abnormal ekg Confirmed by MSabra Heck MD, Joni Colegrove ([id number] on 07/20/2014  11:32:16 AM      MDM   Final diagnoses:  Abdominal pain  Cough  Pulmonary nodule    The patient has very extremely vague symptoms, though she doesn't complain of abdominal discomfort she has significant left lower quadrant tenderness with mild guarding, her vital signs are normal, her lactic acid is elevated raising concern for some inflammatory or infectious process, IV fluids, labs, CT scan of the abdomen and pelvis.  CT scan shows a pulmonary nodule, the patient was informed of this, she has no acute findings in the abdomen or pelvis, she appears stable, she will be given meclizine for vertigo, stable for discharge.  Pt expresses understanding- I have given her a copy of all her results.  BNoemi Chapel MD 07/20/14 [phone]

## 2014-07-20 NOTE — ED Notes (Signed)
This RN attempted to start an IV w/o success.  Asked Agricultural consultant to attempt.  Charge RN will be a third Therapist, sports attempting.

## 2014-07-20 NOTE — ED Notes (Signed)
Pt finished drinking contrast. CT notified. 

## 2014-07-28 ENCOUNTER — Ambulatory Visit: Payer: Medicaid Other | Attending: Family Medicine | Admitting: Family Medicine

## 2014-07-28 ENCOUNTER — Encounter: Payer: Self-pay | Admitting: Family Medicine

## 2014-07-28 VITALS — BP 104/72 | HR 67 | Temp 98.6°F | Resp 16 | Ht 67.0 in | Wt 234.0 lb

## 2014-07-28 DIAGNOSIS — E119 Type 2 diabetes mellitus without complications: Secondary | ICD-10-CM | POA: Diagnosis not present

## 2014-07-28 DIAGNOSIS — R911 Solitary pulmonary nodule: Secondary | ICD-10-CM | POA: Diagnosis not present

## 2014-07-28 DIAGNOSIS — E1169 Type 2 diabetes mellitus with other specified complication: Secondary | ICD-10-CM

## 2014-07-28 DIAGNOSIS — K5730 Diverticulosis of large intestine without perforation or abscess without bleeding: Secondary | ICD-10-CM | POA: Insufficient documentation

## 2014-07-28 DIAGNOSIS — R42 Dizziness and giddiness: Secondary | ICD-10-CM | POA: Insufficient documentation

## 2014-07-28 DIAGNOSIS — H5500 Unspecified nystagmus: Secondary | ICD-10-CM | POA: Insufficient documentation

## 2014-07-28 DIAGNOSIS — E785 Hyperlipidemia, unspecified: Secondary | ICD-10-CM

## 2014-07-28 LAB — POCT URINALYSIS DIPSTICK
Bilirubin, UA: NEGATIVE
Blood, UA: NEGATIVE
Glucose, UA: NEGATIVE
Ketones, UA: NEGATIVE
Nitrite, UA: NEGATIVE
Protein, UA: NEGATIVE
Spec Grav, UA: >=1.03
Urobilinogen, UA: 0.2
pH, UA: 5.5

## 2014-07-28 LAB — GLUCOSE, POCT (MANUAL RESULT ENTRY): POC Glucose: 113 mg/dl — AB (ref 70–99)

## 2014-07-28 MED ORDER — MECLIZINE HCL 25 MG PO TABS: 25.0000 mg | ORAL_TABLET | Freq: Three times a day (TID) | ORAL | Status: DC | PRN

## 2014-07-28 MED ORDER — ATORVASTATIN CALCIUM 20 MG PO TABS
20.0000 mg | ORAL_TABLET | Freq: Every day | ORAL | Status: DC
Start: 2014-07-28 — End: 2014-09-08

## 2014-07-28 NOTE — Assessment & Plan Note (Signed)
A; incidental lung nodule P: F/u CT chest 10/20/2014

## 2014-07-28 NOTE — Progress Notes (Signed)
   Subjective:    Patient ID: Rachel Herman, female    DOB: 1950/03/29, 64 y.o.   MRN: [id number] CC: Dizziness  HPI 64 yo F with breast cancer on active treatment, lumbar DJD, diabetes   1. Dizziness: acute onset on 07/13/2014. Dizziness when laying down and changing position. Room spinning. Mild nausea. No emesis. Has some ringing in ears. Has blurred vision in L eye. Meclizine 12.5 mg TID has not helped. She went to ED for dizziness and abdominal pain.    2.  New  R upper lung nodule: CT abdomen  Done on 07/20/14 for abdominal pain revealed a RUL 4 mm lung nodule that was new. Repeat CT chest in 3 months recommended. Neuroimaging was not done.   Soc Hx: non smoker  Review of Systems  Constitutional: Negative for fever and chills.  Eyes: Positive for visual disturbance.  Respiratory: Negative for shortness of breath.   Cardiovascular: Negative for chest pain.  Gastrointestinal: Positive for nausea. Negative for vomiting.  Neurological: Positive for dizziness. Negative for headaches.      Objective:   Physical Exam BP 104/72 mmHg  Pulse 67  Temp(Src) 98.6 F (37 C) (Oral)  Resp 16  Ht 5\' 7"  (1.702 m)  Wt 234 lb (106.142 kg)  BMI 36.64 kg/m2  SpO2 97% General appearance: alert, cooperative and no distress Eyes: conjunctivae/corneas clear. PERRL, EOM's intact. Fundi benign. Ears: normal TM and external ear canal right ear and abnormal TM left ear - slight increased vascular markings superiorly  Nose: Nares normal. Septum midline. Mucosa normal. No drainage or sinus tenderness.  Lungs: clear to auscultation bilaterally Heart: regular rate and rhythm, S1, S2 normal, no murmur, click, rub or gallop Neurologic: Cranial nerves: normal Sensory: normal Motor: grossly normal Gait: Normal  dix-hallpike: nystagmus noted when patient place in supine position.   Lab Results  Component Value Date   HGBA1C 6.50 06/02/2014   CBG 113     Assessment & Plan:

## 2014-07-28 NOTE — Progress Notes (Signed)
C/C Dizziness x 2 weeks  Worsen laying down, and as sitting from laying down. No pain, no injury

## 2014-07-28 NOTE — Patient Instructions (Addendum)
Ms. Peaster,  Thank you for coming in today  1. Vertigo: Most cases are benign Given your history and L eye blurriness:  Plan for CT head Vit D as deficiency can cause various forms of dizziness Vestibular rehab  Increase meclizine to 25 mg three times daily  Urine is slightly concentrated, increase intake of water   If you develop headache or loss of vision please go to ED.  F/u in 4 weeks forvertigo  Vertigo Vertigo means you feel like you or your surroundings are moving when they are not. Vertigo can be dangerous if it occurs when you are at work, driving, or performing difficult activities.  CAUSES  Vertigo occurs when there is a conflict of signals sent to your brain from the visual and sensory systems in your body. There are many different causes of vertigo, including:  Infections, especially in the inner ear.  A bad reaction to a drug or misuse of alcohol and medicines.  Withdrawal from drugs or alcohol.  Rapidly changing positions, such as lying down or rolling over in bed.  A migraine headache.  Decreased blood flow to the brain.  Increased pressure in the brain from a head injury, infection, tumor, or bleeding. SYMPTOMS  You may feel as though the world is spinning around or you are falling to the ground. Because your balance is upset, vertigo can cause nausea and vomiting. You may have involuntary eye movements (nystagmus). DIAGNOSIS  Vertigo is usually diagnosed by physical exam. If the cause of your vertigo is unknown, your caregiver may perform imaging tests, such as an MRI scan (magnetic resonance imaging). TREATMENT  Most cases of vertigo resolve on their own, without treatment. Depending on the cause, your caregiver may prescribe certain medicines. If your vertigo is related to body position issues, your caregiver may recommend movements or procedures to correct the problem. In rare cases, if your vertigo is caused by certain inner ear problems, you may  need surgery. HOME CARE INSTRUCTIONS   Follow your caregiver's instructions.  Avoid driving.  Avoid operating heavy machinery.  Avoid performing any tasks that would be dangerous to you or others during a vertigo episode.  Tell your caregiver if you notice that certain medicines seem to be causing your vertigo. Some of the medicines used to treat vertigo episodes can actually make them worse in some people. SEEK IMMEDIATE MEDICAL CARE IF:   Your medicines do not relieve your vertigo or are making it worse.  You develop problems with talking, walking, weakness, or using your arms, hands, or legs.  You develop severe headaches.  Your nausea or vomiting continues or gets worse.  You develop visual changes.  A family member notices behavioral changes.  Your condition gets worse. MAKE SURE YOU:  Understand these instructions.  Will watch your condition.  Will get help right away if you are not doing well or get worse. Document Released: 10/31/2004 Document Revised: 04/15/2011 Document Reviewed: 08/09/2010 [facility] Patient Information 2015 [place], [place]. This information is not intended to replace advice given to you by your health care provider. Make sure you discuss any questions you have with your health care provider.

## 2014-07-28 NOTE — Assessment & Plan Note (Addendum)
Vertigo: Most cases are benign Given your history and L eye blurriness:  Plan for CT head Vit D  Check as deficiency can cause various forms of dizziness Vestibular rehab  Increase meclizine to 25 mg three times daily  Urine is slightly concentrated, increase intake of water   Patient's insurance would not improve CT head. Order cancelled. Patient notified.

## 2014-07-29 ENCOUNTER — Telehealth: Payer: Self-pay | Admitting: Family Medicine

## 2014-07-29 DIAGNOSIS — M545 Low back pain, unspecified: Secondary | ICD-10-CM

## 2014-07-29 DIAGNOSIS — M79605 Pain in left leg: Principal | ICD-10-CM

## 2014-07-29 LAB — VITAMIN D 25 HYDROXY (VIT D DEFICIENCY, FRACTURES): Vit D, 25-Hydroxy: 39 ng/mL (ref 30–100)

## 2014-07-29 NOTE — Telephone Encounter (Signed)
Patient called requesting medication refill for traMADol (ULTRAM) 50 MG tablet and methocarbamol (ROBAXIN) 500 MG tablet. Please f/u with patient

## 2014-08-01 ENCOUNTER — Telehealth: Payer: Self-pay | Admitting: Family Medicine

## 2014-08-01 MED ORDER — METHOCARBAMOL 500 MG PO TABS
500.0000 mg | ORAL_TABLET | Freq: Every evening | ORAL | Status: DC | PRN
Start: 2014-08-01 — End: 2014-10-07

## 2014-08-01 NOTE — Telephone Encounter (Signed)
Please inform patient  refiiled robaxin  Tramadol rx  From 06/28/14 had 2 refill, patient to call pharmacy for refill request.

## 2014-08-01 NOTE — Telephone Encounter (Signed)
[facility] calling to inform that pt's upcoming CT scan was denied by Medicaid.  Please f/u with pt with further instructions.

## 2014-08-02 NOTE — Telephone Encounter (Signed)
Noted.  Rachel Herman will work on the prior authorization'  Called patient to give her an update. She reports persistent dizziness, now with HA x 3 days. Nausea but no emesis.   Taking tylenol for ha.    Advised: Go to ED for HA if nausea occurs  Will appeal medicaid denial for CT head and call her with update.

## 2014-08-03 ENCOUNTER — Telehealth: Payer: Self-pay | Admitting: *Deleted

## 2014-08-03 DIAGNOSIS — R42 Dizziness and giddiness: Secondary | ICD-10-CM

## 2014-08-03 NOTE — Telephone Encounter (Signed)
Spoke with Nurse Reviewer, Butch Penny, at [facility] [phone] regarding case [id number] Head CT had been denied.  Additional clinical information given.  Butch Penny states that this case has been reopened and a determination will be made by end of business tomorrow

## 2014-08-04 ENCOUNTER — Telehealth: Payer: Self-pay | Admitting: *Deleted

## 2014-08-04 ENCOUNTER — Ambulatory Visit (HOSPITAL_COMMUNITY): Payer: Medicaid Other

## 2014-08-04 NOTE — Telephone Encounter (Signed)
Pt aware of results 

## 2014-08-04 NOTE — Telephone Encounter (Signed)
-----   Message from Boykin Nearing, MD sent at 08/01/2014  4:53 PM EDT ----- Normal vit D

## 2014-08-05 NOTE — Telephone Encounter (Signed)
Per [facility] Guidelines, clinical information given does not describe need for requested study, study type, study contrast level, or modality This decision was upheld after second review.  Note routed to PCP

## 2014-08-15 MED ORDER — MECLIZINE HCL 25 MG PO TABS: 25.0000 mg | ORAL_TABLET | Freq: Three times a day (TID) | ORAL | Status: DC | PRN

## 2014-08-15 NOTE — Addendum Note (Signed)
Addended by: Boykin Nearing on: 08/15/2014 10:33 AM   Modules accepted: Orders

## 2014-08-15 NOTE — Telephone Encounter (Addendum)
Noted. Please inform patient.  Patient's insurance continues to deny CT head.  Will continue to treat medically and re-order imaging if new symptoms arise.  Order cancelled.

## 2014-08-15 NOTE — Telephone Encounter (Signed)
LVM to return call.

## 2014-08-15 NOTE — Addendum Note (Signed)
Addended by: Boykin Nearing on: 08/15/2014 09:45 AM   Modules accepted: Orders

## 2014-08-15 NOTE — Telephone Encounter (Signed)
Pt aware.

## 2014-08-26 ENCOUNTER — Other Ambulatory Visit: Payer: Self-pay | Admitting: Family Medicine

## 2014-08-26 DIAGNOSIS — R42 Dizziness and giddiness: Secondary | ICD-10-CM

## 2014-08-26 MED ORDER — MECLIZINE HCL 25 MG PO TABS: 25.0000 mg | ORAL_TABLET | Freq: Three times a day (TID) | ORAL | Status: DC | PRN

## 2014-09-08 ENCOUNTER — Ambulatory Visit: Payer: Medicaid Other | Attending: Family Medicine | Admitting: Family Medicine

## 2014-09-08 ENCOUNTER — Encounter: Payer: Self-pay | Admitting: Family Medicine

## 2014-09-08 VITALS — BP 137/86 | HR 79 | Temp 98.2°F | Resp 16 | Ht 67.0 in | Wt 234.0 lb

## 2014-09-08 DIAGNOSIS — M549 Dorsalgia, unspecified: Secondary | ICD-10-CM | POA: Diagnosis not present

## 2014-09-08 DIAGNOSIS — E669 Obesity, unspecified: Secondary | ICD-10-CM | POA: Diagnosis not present

## 2014-09-08 DIAGNOSIS — M79605 Pain in left leg: Secondary | ICD-10-CM

## 2014-09-08 DIAGNOSIS — R42 Dizziness and giddiness: Secondary | ICD-10-CM | POA: Diagnosis not present

## 2014-09-08 DIAGNOSIS — E119 Type 2 diabetes mellitus without complications: Secondary | ICD-10-CM

## 2014-09-08 DIAGNOSIS — M545 Low back pain, unspecified: Secondary | ICD-10-CM

## 2014-09-08 DIAGNOSIS — M4726 Other spondylosis with radiculopathy, lumbar region: Secondary | ICD-10-CM | POA: Diagnosis not present

## 2014-09-08 LAB — POCT GLYCOSYLATED HEMOGLOBIN (HGB A1C): Hemoglobin A1C: 6.3

## 2014-09-08 LAB — GLUCOSE, POCT (MANUAL RESULT ENTRY): POC Glucose: 75 mg/dl (ref 70–99)

## 2014-09-08 MED ORDER — TRAMADOL HCL 50 MG PO TABS: 25.0000 mg | ORAL_TABLET | Freq: Three times a day (TID) | ORAL | Status: DC | PRN

## 2014-09-08 NOTE — Assessment & Plan Note (Signed)
Vertigo: Ongoing but improved Continue meclizine as needed Neurology referral since symptoms are persistent

## 2014-09-08 NOTE — Assessment & Plan Note (Signed)
Diabetes well controlled Continue current regimen  

## 2014-09-08 NOTE — Assessment & Plan Note (Signed)
Back in hips down legs and shoulders: Stop lipitor Continue tramadol, refilled Stay active with exercise

## 2014-09-08 NOTE — Patient Instructions (Signed)
Rachel Herman,  Thank you for coming in today  1. Vertigo: Ongoing but improved Continue meclizine as needed Neurology referral since symptoms are persistent   2. Back in hips down legs and shoulders: Stop lipitor Continue tramadol, refilled Stay active with exercise   3. Difficulty losing weight  Ketogenic diet recommended  High fat grams from nuts, lean meats, cheese, dressing, butter  Moderate protein- fish, chicken, tofu, tempeh Low carb -  Eat fibrous veggies. Avoid sugar, grains, fruits, starchy veggies.   4. Diabetes: well controlled Continue current regimen  F/u in 3 months for diabetes  Dr. Adrian Blackwater

## 2014-09-08 NOTE — Assessment & Plan Note (Signed)
Difficulty losing weight  Ketogenic diet recommended  High fat grams from nuts, lean meats, cheese, dressing, butter  Moderate protein- fish, chicken, tofu, tempeh Low carb -  Eat fibrous veggies. Avoid sugar, grains, fruits, starchy veggies.

## 2014-09-08 NOTE — Progress Notes (Signed)
   Subjective:    Patient ID: Rachel Herman, female    DOB: 1950/08/13, 64 y.o.   MRN: [id number] CC: f/u vertigo  HPI 64 yo F presents for f/u appt  1. Vertigo: improved. Dizziness when laying down with head to R. Using some vestibular movements at home. No weakness. No numbness. No HA.   2. Back pain: pain from gluteal area down posterior and anterior legs. Also have aches in shoulder. Taking lipitor 20 mg daily. Tramadol helps. No recent injury. No leg weakness.  3. Obesity: having trouble losing weight. Would like to be < 200 #. Eats veggies, crackers, rare protein. Has DM2 that is well controlled.    History  Substance Use Topics  . Smoking status: Never Smoker   . Smokeless tobacco: Never Used  . Alcohol Use: No   Review of Systems  Constitutional: Negative for fever and chills.  Eyes: Negative for visual disturbance.  Respiratory: Negative for shortness of breath.   Cardiovascular: Negative for chest pain.  Gastrointestinal: Negative for abdominal pain and blood in stool.  Musculoskeletal: Positive for myalgias and back pain. Negative for arthralgias.  Skin: Negative for rash.  Allergic/Immunologic: Negative for immunocompromised state.  Neurological: Positive for dizziness.  Hematological: Negative for adenopathy. Does not bruise/bleed easily.  Psychiatric/Behavioral: Negative for suicidal ideas and dysphoric mood.      Objective:   Physical Exam  Constitutional: She is oriented to person, place, and time. She appears well-developed and well-nourished. No distress.  HENT:  Head: Normocephalic and atraumatic.  Cardiovascular: Normal rate, regular rhythm, normal heart sounds and intact distal pulses.   Pulmonary/Chest: Effort normal and breath sounds normal.  Musculoskeletal: She exhibits no edema.  Neurological: She is alert and oriented to person, place, and time.  Skin: Skin is warm and dry. No rash noted.  Psychiatric: She has a normal mood and affect.  BP  137/86 mmHg  Pulse 79  Temp(Src) 98.2 F (36.8 C) (Oral)  Resp 16  Ht 5\' 7"  (1.702 m)  Wt 234 lb (106.142 kg)  BMI 36.64 kg/m2  SpO2 97%    Lab Results  Component Value Date   HGBA1C 6.50 06/02/2014   CBG 75 Assessment & Plan:

## 2014-09-08 NOTE — Progress Notes (Signed)
F/U Vertigo  Worsen when laying down

## 2014-09-28 ENCOUNTER — Ambulatory Visit (INDEPENDENT_AMBULATORY_CARE_PROVIDER_SITE_OTHER): Payer: Medicaid Other | Admitting: Neurology

## 2014-09-28 ENCOUNTER — Encounter: Payer: Self-pay | Admitting: Neurology

## 2014-09-28 VITALS — BP 176/95 | HR 87 | Ht 67.0 in | Wt 241.0 lb

## 2014-09-28 DIAGNOSIS — H8111 Benign paroxysmal vertigo, right ear: Secondary | ICD-10-CM

## 2014-09-28 DIAGNOSIS — Z853 Personal history of malignant neoplasm of breast: Secondary | ICD-10-CM | POA: Diagnosis not present

## 2014-09-28 NOTE — Progress Notes (Signed)
PATIENT: Rachel Herman DOB: December 19, 1950  Chief Complaint  Patient presents with  . Dizziness    Orthostatic Vitals: Lying 176/95, 87 - Sitting 152/95, 78 - Standing 166/87, 78. She started having dizzy spells in June 2016.  Symptoms are worse when lying down then going to a seated position.  She has been taking meclizine with only mild relief.     HISTORICAL  Rachel Herman is a 64 years old right-handed female, seen in refer by  her primary care physician Boykin Nearing, MD for evaluation of dizziness  She has history of left breast cancer, status post left lobectomy followed by radiation, hypertension diabetes  She presented with dizziness in July 15 2014, at nighttime, she turned and lie down at her right side, noticed that onset of vertigo, nausea, unsteady gait, she has to be helped by her husband to get up, symptoms gradually improved up she lied down still,  But since the initial event, she has recurrent episode of transient dizziness, especially when she lying to her right side, improved if she lies on her left side, also triggered by sudden positional change. She denied hearing loss, no tinnitus.  She denies significant gait difficulty  REVIEW OF SYSTEMS: Full 14 system review of systems performed and notable only for as above  ALLERGIES: No Known Allergies  HOME MEDICATIONS: Current Outpatient Prescriptions  Medication Sig Dispense Refill  . ACCU-CHEK FASTCLIX LANCETS MISC 1 Units by Percutaneous route 4 (four) times daily. 100 each 12  . acetaminophen (KLS NON ASPIRIN) 500 MG tablet Take 500 mg by mouth every 8 (eight) hours as needed.     Marland Kitchen acetaminophen (TYLENOL) 500 MG tablet Take 1 tablet (500 mg total) by mouth every 8 (eight) hours as needed. 90 tablet 5  . Ascorbic Acid (VITAMIN C) 1000 MG tablet Take 1,000 mg by mouth daily.    Marland Kitchen aspirin 325 MG tablet Take 325 mg by mouth once.    . Blood Glucose Monitoring Suppl (ACCU-CHEK NANO SMARTVIEW) W/DEVICE  KIT 1 kit by Subdermal route as directed. Check blood sugars for fasting, and two hours after breakfast, lunch and dinner (4 checks daily) 1 kit 0  . Docusate Sodium (DSS) 100 MG CAPS Take 100 mg by mouth 2 (two) times daily. (Patient taking differently: Take 200 mg by mouth every evening. ) 30 each 3  . exemestane (AROMASIN) 25 MG tablet Take 1 tablet (25 mg total) by mouth daily after breakfast. 30 tablet 3  . gabapentin (NEURONTIN) 300 MG capsule 300 mg during the day, 600 mg at night by mouth 270 capsule 1  . glucose blood (ACCU-CHEK SMARTVIEW) test strip 1 each by Other route 3 (three) times daily. ICD 10 E11.9 100 each 12  . hydrochlorothiazide (HYDRODIURIL) 25 MG tablet Take 1 tablet (25 mg total) by mouth daily. 90 tablet 3  . Lancets (ACCU-CHEK MULTICLIX) lancets 1 each by Other route 3 (three) times daily. ICD 10 E11.9 100 each 12  . lisinopril (PRINIVIL,ZESTRIL) 20 MG tablet Take 1 tablet (20 mg total) by mouth daily. 90 tablet 3  . magnesium hydroxide (MILK OF MAGNESIA) 400 MG/5ML suspension Take 30 mLs by mouth as needed for mild constipation.    . meclizine (ANTIVERT) 25 MG tablet Take 1 tablet (25 mg total) by mouth 3 (three) times daily as needed for dizziness. 60 tablet 2  . metFORMIN (GLUCOPHAGE) 500 MG tablet Take 1 tablet (500 mg total) by mouth 2 (two) times daily. 180 tablet 3  .  methocarbamol (ROBAXIN) 500 MG tablet Take 1 tablet (500 mg total) by mouth at bedtime as needed for muscle spasms. 30 tablet 3  . Multiple Vitamins-Minerals (MULTIVITAMIN WITH MINERALS) tablet Take 1 tablet by mouth daily.    . ondansetron (ZOFRAN) 8 MG tablet Take 1 tablet (8 mg total) by mouth every 8 (eight) hours as needed for nausea or vomiting. 30 tablet 0  . traMADol (ULTRAM) 50 MG tablet Take 0.5-1 tablets (25-50 mg total) by mouth every 8 (eight) hours as needed. 90 tablet 2  . vitamin D, CHOLECALCIFEROL, 400 UNITS tablet Take 400 Units by mouth daily.     No current facility-administered  medications for this visit.    PAST MEDICAL HISTORY: Past Medical History  Diagnosis Date  . Obesity, morbid, BMI 40.0-49.9   . Hypertension 2014  . Hyperlipidemia   . PONV (postoperative nausea and vomiting)   . S/P radiation therapy 04/05/14-05/20/14    left breast 60.4Gy totaldose  . Diabetes mellitus 09/16/13    Diagnosed on 09/16/13; HgA1C was 7.2/metformin  . Breast cancer 12/09/13    left breast Invasive Ductal Carcinoma  . Dizziness     PAST SURGICAL HISTORY: Past Surgical History  Procedure Laterality Date  . Abdominal hysterectomy N/A 09/20/2013    Procedure: HYSTERECTOMY ABDOMINAL;  Surgeon: Osborne Oman, MD;  Location: [facility];  Service: Gynecology;  Laterality: N/A;  . Salpingoophorectomy  09/20/2013    Procedure: SALPINGO OOPHORECTOMY;  Surgeon: Osborne Oman, MD;  Location: [facility];  Service: Gynecology;;  . Laparoscopic assisted vaginal hysterectomy  09/20/2013    Procedure: LAPAROSCOPIC ASSISTED VAGINAL HYSTERECTOMY;  Surgeon: Osborne Oman, MD;  Location: [facility];  Service: Gynecology;;  PT WAS EXAMINED WHILE UP IN [place] AND IT WAS DECIDED TO OPEN PT DUE TO LARGE MASS  . Breast lumpectomy with axillary lymph node biopsy Left 12/29/13    left breast   . Radioactive seed guided mastectomy with axillary sentinel lymph node biopsy Left 12/29/2013    Procedure: LEFT BREAST RADIOACTIVE SEED LOCALIZED LUMPECTOMY WITH SENTINEL LYMPH NODE MAPPING;  Surgeon: Erroll Luna, MD;  Location: [facility];  Service: General;  Laterality: Left;  . Re-excision of breast lumpectomy Left 03/02/2014    Procedure: RE-EXCISION OF LEFT BREAST LUMPECTOMY;  Surgeon: Erroll Luna, MD;  Location: [facility];  Service: General;  Laterality: Left;    FAMILY HISTORY: Family History  Problem Relation Age of Onset  . Diabetes Mother   . Multiple myeloma Mother 36  . Hearing loss Mother   . Diabetes Sister   . Diabetes Brother   . Cancer Brother 40     prostate cancer   . Diabetes Son   . Diabetes Maternal Aunt   . Leukemia Maternal Aunt     dx in her 54s  . Alcoholism Brother   . Heart attack Father     SOCIAL HISTORY:  Social History   Social History  . Marital Status: Married    Spouse Name: Shanece Cochrane   . Number of Children: 3  . Years of Education: 12   Occupational History  .  Unemployed   Social History Main Topics  . Smoking status: Never Smoker   . Smokeless tobacco: Never Used  . Alcohol Use: No  . Drug Use: No  . Sexual Activity: Not Currently    Birth Control/ Protection: Post-menopausal   Other Topics Concern  . Not on file   Social History Narrative   Married to [place]  [place]ditt in 1986.    Has 3 children from previous marriage, 1 in [place], 1 in [place], 1 in prison ([place]).    Lives with husband.    Right-handed.   1 cup caffeine per day.        PHYSICAL EXAM   Filed Vitals:   09/28/14 1122  BP: 176/95  Pulse: 87  Height: '5\' 7"'  (1.702 m)  Weight: 241 lb (109.317 kg)    Not recorded      Body mass index is 37.74 kg/(m^2).  PHYSICAL EXAMNIATION:  Gen: NAD, conversant, well nourised, obese, well groomed                     Cardiovascular: Regular rate rhythm, no peripheral edema, warm, nontender. Eyes: Conjunctivae clear without exudates or hemorrhage Neck: Supple, no carotid bruise. Pulmonary: Clear to auscultation bilaterally   NEUROLOGICAL EXAM:  MENTAL STATUS: Speech:    Speech is normal; fluent and spontaneous with normal comprehension.  Cognition:     Orientation to time, place and person     Normal recent and remote memory     Normal Attention span and concentration     Normal Language, naming, repeating,spontaneous speech     Fund of knowledge   CRANIAL NERVES: CN II: Visual fields are full to confrontation. Fundoscopic exam is normal with sharp discs and no vascular changes. Pupils are round equal and briskly reactive to light. CN III, IV, VI:  extraocular movement are normal. No ptosis. CN V: Facial sensation is intact to pinprick in all 3 divisions bilaterally. Corneal responses are intact.  CN VII: Face is symmetric with normal eye closure and smile. CN VIII: Hearing is normal to rubbing fingers CN IX, X: Palate elevates symmetrically. Phonation is normal. CN XI: Head turning and shoulder shrug are intact CN XII: Tongue is midline with normal movements and no atrophy.  MOTOR: There is no pronator drift of out-stretched arms. Muscle bulk and tone are normal. Muscle strength is normal.  REFLEXES: Reflexes are 2+ and symmetric at the biceps, triceps, knees, and ankles. Plantar responses are flexor.  SENSORY: Intact to light touch, pinprick, position sense, and vibration sense are intact in fingers and toes.  COORDINATION: Rapid alternating movements and fine finger movements are intact. There is no dysmetria on finger-to-nose and heel-knee-shin.    GAIT/STANCE: She needs push up from chair arm to get up from seated position, obese, mildly unsteady,  Reposition oh maneuver: Right ear dependent position induced downward rotatory nystagmus, habituate quickly,   DIAGNOSTIC DATA (LABS, IMAGING, TESTING) - I reviewed patient records, labs, notes, testing and imaging myself where available.   ASSESSMENT AND PLAN  Rachel Herman is a 64 y.o. female with vascular risk factor of obesity, hypertension hyperlipidemia, diabetes, history of breast cancer, status post left lobectomy, radiation therapy, presenting with acute onset of dizziness since July 15 2014, right ear dependent position induce rotatory downward beating nystagmus,   Benign positional vertigo.  Continual repositional maneuver History of breast cancer, multiple vascular risk factors  MRI of the brain with and without contrast to rule out brainstem/cerebellar pathology   Marcial Pacas, M.D. Ph.D.  [facility] [address], [place], [place] [address] Ph: [phone] Fax: [phone]  CC: Referring Provider

## 2014-09-28 NOTE — Patient Instructions (Signed)
Benign Positional Vertigo Vertigo means you feel like you or your surroundings are moving when they are not. Benign positional vertigo is the most common form of vertigo. Benign means that the cause of your condition is not serious. Benign positional vertigo is more common in older adults. CAUSES  Benign positional vertigo is the result of an upset in the labyrinth system. This is an area in the middle ear that helps control your balance. This may be caused by a viral infection, head injury, or repetitive motion. However, often no specific cause is found. SYMPTOMS  Symptoms of benign positional vertigo occur when you move your head or eyes in different directions. Some of the symptoms may include:  Loss of balance and falls.  Vomiting.  Blurred vision.  Dizziness.  Nausea.  Involuntary eye movements (nystagmus). DIAGNOSIS  Benign positional vertigo is usually diagnosed by physical exam. If the specific cause of your benign positional vertigo is unknown, your caregiver may perform imaging tests, such as magnetic resonance imaging (MRI) or computed tomography (CT). TREATMENT  Your caregiver may recommend movements or procedures to correct the benign positional vertigo. Medicines such as meclizine, benzodiazepines, and medicines for nausea may be used to treat your symptoms. In rare cases, if your symptoms are caused by certain conditions that affect the inner ear, you may need surgery. HOME CARE INSTRUCTIONS   Follow your caregiver's instructions.  Move slowly. Do not make sudden body or head movements.  Avoid driving.  Avoid operating heavy machinery.  Avoid performing any tasks that would be dangerous to you or others during a vertigo episode.  Drink enough fluids to keep your urine clear or pale yellow. SEEK IMMEDIATE MEDICAL CARE IF:   You develop problems with walking, weakness, numbness, or using your arms, hands, or legs.  You have difficulty speaking.  You develop  severe headaches.  Your nausea or vomiting continues or gets worse.  You develop visual changes.  Your family or friends notice any behavioral changes.  Your condition gets worse.  You have a fever.  You develop a stiff neck or sensitivity to light. MAKE SURE YOU:   Understand these instructions.  Will watch your condition.  Will get help right away if you are not doing well or get worse. Document Released: 10/29/2005 Document Revised: 04/15/2011 Document Reviewed: 10/11/2010 [employer] Patient Information 2015 [employer]. This information is not intended to replace advice given to you by your health care provider. Make sure you discuss any questions you have with your health care provider.    

## 2014-10-04 ENCOUNTER — Other Ambulatory Visit: Payer: Self-pay | Admitting: Neurology

## 2014-10-05 ENCOUNTER — Ambulatory Visit
Admission: RE | Admit: 2014-10-05 | Discharge: 2014-10-05 | Disposition: A | Discharge status: Home / Self Care | Payer: Medicaid Other | Source: Ambulatory Visit | Attending: Neurology | Admitting: Neurology

## 2014-10-05 DIAGNOSIS — Z853 Personal history of malignant neoplasm of breast: Secondary | ICD-10-CM

## 2014-10-05 DIAGNOSIS — H8111 Benign paroxysmal vertigo, right ear: Secondary | ICD-10-CM

## 2014-10-05 MED ORDER — GADOBENATE DIMEGLUMINE 529 MG/ML IV SOLN: 20.0000 mL | Freq: Once | INTRAVENOUS | Status: DC | PRN

## 2014-10-05 MED ORDER — GADOBENATE DIMEGLUMINE 529 MG/ML IV SOLN
20.0000 mL | Freq: Once | INTRAVENOUS | Status: AC | PRN
  Administered 2014-10-05: 20 mL via INTRAVENOUS

## 2014-10-07 ENCOUNTER — Other Ambulatory Visit: Payer: Self-pay | Admitting: Family Medicine

## 2014-10-07 DIAGNOSIS — M545 Low back pain, unspecified: Secondary | ICD-10-CM

## 2014-10-07 DIAGNOSIS — M79605 Pain in left leg: Principal | ICD-10-CM

## 2014-10-07 MED ORDER — METHOCARBAMOL 500 MG PO TABS: 500.0000 mg | ORAL_TABLET | Freq: Every evening | ORAL | Status: DC | PRN

## 2014-10-11 ENCOUNTER — Telehealth: Payer: Self-pay | Admitting: *Deleted

## 2014-10-11 NOTE — Telephone Encounter (Signed)
Spoke to patient - aware of normal results.

## 2014-10-11 NOTE — Telephone Encounter (Signed)
-----   Message from Marcial Pacas, MD sent at 10/08/2014 12:29 PM EDT ----- Please call pt for normal MRI brain.

## 2014-10-13 ENCOUNTER — Encounter: Payer: Self-pay | Admitting: Hematology

## 2014-10-13 ENCOUNTER — Ambulatory Visit (HOSPITAL_BASED_OUTPATIENT_CLINIC_OR_DEPARTMENT_OTHER): Payer: Medicaid Other | Admitting: Hematology

## 2014-10-13 ENCOUNTER — Other Ambulatory Visit (HOSPITAL_BASED_OUTPATIENT_CLINIC_OR_DEPARTMENT_OTHER): Payer: Medicaid Other

## 2014-10-13 ENCOUNTER — Telehealth: Payer: Self-pay | Admitting: Hematology

## 2014-10-13 VITALS — BP 111/47 | HR 72 | Temp 98.2°F | Resp 18 | Ht 67.0 in | Wt 234.3 lb

## 2014-10-13 DIAGNOSIS — C50212 Malignant neoplasm of upper-inner quadrant of left female breast: Secondary | ICD-10-CM

## 2014-10-13 DIAGNOSIS — R911 Solitary pulmonary nodule: Secondary | ICD-10-CM | POA: Diagnosis not present

## 2014-10-13 DIAGNOSIS — N644 Mastodynia: Secondary | ICD-10-CM

## 2014-10-13 DIAGNOSIS — E669 Obesity, unspecified: Secondary | ICD-10-CM

## 2014-10-13 DIAGNOSIS — Z170 Estrogen receptor positive status [ER+]: Secondary | ICD-10-CM | POA: Diagnosis not present

## 2014-10-13 DIAGNOSIS — Z79811 Long term (current) use of aromatase inhibitors: Secondary | ICD-10-CM

## 2014-10-13 DIAGNOSIS — C50912 Malignant neoplasm of unspecified site of left female breast: Secondary | ICD-10-CM

## 2014-10-13 LAB — COMPREHENSIVE METABOLIC PANEL (CC13)
ALT: 33 U/L (ref 0–55)
AST: 25 U/L (ref 5–34)
Albumin: 3.8 g/dL (ref 3.5–5.0)
Alkaline Phosphatase: 54 U/L (ref 40–150)
Anion Gap: 11 mEq/L (ref 3–11)
BUN: 23.8 mg/dL (ref 7.0–26.0)
CO2: 27 mEq/L (ref 22–29)
Calcium: 10.1 mg/dL (ref 8.4–10.4)
Chloride: 104 mEq/L (ref 98–109)
Creatinine: 1.2 mg/dL — ABNORMAL HIGH (ref 0.6–1.1)
EGFR: 50 mL/min/{1.73_m2} — ABNORMAL LOW (ref >90)
Glucose: 120 mg/dl (ref 70–140)
Potassium: 4.2 mEq/L (ref 3.5–5.1)
Sodium: 142 mEq/L (ref 136–145)
Total Bilirubin: 0.47 mg/dL (ref 0.20–1.20)
Total Protein: 7.6 g/dL (ref 6.4–8.3)

## 2014-10-13 LAB — CBC WITH DIFFERENTIAL/PLATELET
BASO%: 0.4 % (ref 0.0–2.0)
Basophils Absolute: 0 10*3/uL (ref 0.0–0.1)
EOS%: 2.2 % (ref 0.0–7.0)
Eosinophils Absolute: 0.2 10*3/uL (ref 0.0–0.5)
HCT: 40.7 % (ref 34.8–46.6)
HGB: 13 g/dL (ref 11.6–15.9)
LYMPH%: 27.4 % (ref 14.0–49.7)
MCH: 28.6 pg (ref 25.1–34.0)
MCHC: 31.9 g/dL (ref 31.5–36.0)
MCV: 89.5 fL (ref 79.5–101.0)
MONO#: 0.5 10*3/uL (ref 0.1–0.9)
MONO%: 7.1 % (ref 0.0–14.0)
NEUT#: 4.5 10*3/uL (ref 1.5–6.5)
NEUT%: 62.9 % (ref 38.4–76.8)
Platelets: 265 10*3/uL (ref 145–400)
RBC: 4.55 10*6/uL (ref 3.70–5.45)
RDW: 13.7 % (ref 11.2–14.5)
WBC: 7.2 10*3/uL (ref 3.9–10.3)
lymph#: 2 10*3/uL (ref 0.9–3.3)

## 2014-10-13 MED ORDER — EXEMESTANE 25 MG PO TABS: 25.0000 mg | ORAL_TABLET | Freq: Every day | ORAL | Status: DC

## 2014-10-13 NOTE — Progress Notes (Signed)
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 Ultrasound of the left axilla is negative.      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    12/29/2013 Surgery Left lumpectomy with close (<0.1cm) inferior margin   03/02/2014 Surgery Reexcision for close margin, path negative for malignancy.   04/05/2014 - 05/20/2014 Radiation Therapy adjuvant breast radiation    05/27/2014 Imaging Bone density scan: normal    CURRENT THERAPY: Exemestane 25mg  daily, started on 06/07/2014                                    INTERIM HISTORY: She returns for follow up. She is compliant with Aromasin, main side effects is hot flash, she feels warm most OF time, has hot flash several times a day,  but she feels is manageable. She also complains of moderate left breast pain and tenderness, sometime hard to wear bra because of pain. She also has some dizziness, and low  back pain, on Neurontin and meclizine which were prescribed by her neurologist and orthopedic surgeon.                                        MEDICAL HISTORY:  Past Medical History  Diagnosis Date  . Obesity, morbid, BMI 40.0-49.9   . Hypertension 2014  . Hyperlipidemia   . PONV (postoperative nausea and vomiting)   . S/P radiation therapy 04/05/14-05/20/14    left breast 60.4Gy totaldose  . Diabetes mellitus 09/16/13    Diagnosed on 09/16/13; HgA1C was 7.2/metformin  . Breast cancer 12/09/13    left breast Invasive Ductal Carcinoma  . Dizziness     SURGICAL HISTORY: Past Surgical History  Procedure Laterality Date  . Abdominal hysterectomy N/A 09/20/2013    Procedure: HYSTERECTOMY ABDOMINAL;  Surgeon: Osborne Oman, MD;  Location: [facility];  Service: Gynecology;  Laterality: N/A;  . Salpingoophorectomy  09/20/2013    Procedure: SALPINGO OOPHORECTOMY;  Surgeon: Osborne Oman, MD;  Location: [facility];  Service: Gynecology;;  . Laparoscopic assisted vaginal hysterectomy  09/20/2013    Procedure: LAPAROSCOPIC ASSISTED VAGINAL HYSTERECTOMY;  Surgeon: Osborne Oman, MD;  Location: [facility];  Service: Gynecology;;  PT WAS EXAMINED WHILE UP IN [place] AND IT WAS DECIDED TO  OPEN PT DUE TO LARGE MASS  . Breast lumpectomy with axillary lymph node biopsy Left 12/29/13    left breast   . Radioactive seed guided mastectomy with axillary sentinel lymph node biopsy Left 12/29/2013    Procedure: LEFT BREAST RADIOACTIVE SEED LOCALIZED LUMPECTOMY WITH SENTINEL LYMPH NODE MAPPING;  Surgeon: Erroll Luna, MD;  Location: [facility];  Service: General;  Laterality: Left;  . Re-excision of breast lumpectomy Left 03/02/2014    Procedure: RE-EXCISION OF LEFT BREAST LUMPECTOMY;  Surgeon: Erroll Luna, MD;  Location: [facility];  Service: General;  Laterality: Left;    SOCIAL HISTORY: Social History   Social History  . Marital Status: Married    Spouse Name: Amillia Biffle    . Number of Children: 3  . Years of Education: 12   Occupational History  .  Unemployed   Social History Main Topics  . Smoking status: Never Smoker   . Smokeless tobacco: Never Used  . Alcohol Use: No  . Drug Use: No  . Sexual Activity: Not Currently    Birth Control/ Protection: Post-menopausal   Other Topics Concern  . Not on file   Social History Narrative   Married to [employer] in 1986.    Has 3 children from previous marriage, 1 in [place], 1 in [place], 1 in prison ([place]).    Lives with husband.    Right-handed.   1 cup caffeine per day.       FAMILY HISTORY: Family History  Problem Relation Age of Onset  . Diabetes Mother   . Multiple myeloma Mother 46  . Hearing loss Mother   . Diabetes Sister   . Diabetes Brother   . Cancer Brother 40    prostate cancer   . Diabetes Son   . Diabetes Maternal Aunt   . Leukemia Maternal Aunt     dx in her 85s  . Alcoholism Brother   . Heart attack Father     ALLERGIES:  has No Known Allergies.  MEDICATIONS:  Current Outpatient Prescriptions  Medication Sig Dispense Refill  . ACCU-CHEK FASTCLIX LANCETS MISC 1 Units by Percutaneous route 4 (four) times daily. 100 each 12  . acetaminophen (TYLENOL) 500 MG tablet Take 1 tablet (500 mg total) by mouth every 8 (eight) hours as needed. 90 tablet 5  . Ascorbic Acid (VITAMIN C) 1000 MG tablet Take 1,000 mg by mouth daily.    Marland Kitchen aspirin 325 MG tablet Take 325 mg by mouth once.    . Blood Glucose Monitoring Suppl (ACCU-CHEK NANO SMARTVIEW) W/DEVICE KIT 1 kit by Subdermal route as directed. Check blood sugars for fasting, and two hours after breakfast, lunch and dinner (4 checks daily) 1 kit 0  . Docusate Sodium (DSS) 100 MG CAPS Take 100 mg by mouth 2 (two) times daily. (Patient taking differently: Take 200 mg by mouth every evening. ) 30 each 3  . exemestane (AROMASIN) 25 MG tablet Take 1 tablet (25 mg total) by mouth daily after breakfast. 30 tablet 3  .  gabapentin (NEURONTIN) 300 MG capsule 300 mg during the day, 600 mg at night by mouth 270 capsule 1  . glucose blood (ACCU-CHEK SMARTVIEW) test strip 1 each by Other route 3 (three) times daily. ICD 10 E11.9 100 each 12  . hydrochlorothiazide (HYDRODIURIL) 25 MG tablet Take 1 tablet (25 mg total) by mouth daily. 90 tablet 3  . Lancets (ACCU-CHEK MULTICLIX) lancets 1 each by Other route 3 (three) times daily. ICD  10 E11.9 100 each 12  . lisinopril (PRINIVIL,ZESTRIL) 20 MG tablet Take 1 tablet (20 mg total) by mouth daily. 90 tablet 3  . magnesium hydroxide (MILK OF MAGNESIA) 400 MG/5ML suspension Take 30 mLs by mouth as needed for mild constipation.    . meclizine (ANTIVERT) 25 MG tablet Take 1 tablet (25 mg total) by mouth 3 (three) times daily as needed for dizziness. 60 tablet 2  . metFORMIN (GLUCOPHAGE) 500 MG tablet Take 1 tablet (500 mg total) by mouth 2 (two) times daily. 180 tablet 3  . methocarbamol (ROBAXIN) 500 MG tablet Take 1 tablet (500 mg total) by mouth at bedtime as needed for muscle spasms. 30 tablet 3  . Multiple Vitamins-Minerals (MULTIVITAMIN WITH MINERALS) tablet Take 1 tablet by mouth daily.    . traMADol (ULTRAM) 50 MG tablet Take 0.5-1 tablets (25-50 mg total) by mouth every 8 (eight) hours as needed. 90 tablet 2  . vitamin D, CHOLECALCIFEROL, 400 UNITS tablet Take 400 Units by mouth daily.    . ondansetron (ZOFRAN) 8 MG tablet Take 1 tablet (8 mg total) by mouth every 8 (eight) hours as needed for nausea or vomiting. (Patient not taking: Reported on 10/13/2014) 30 tablet 0   No current facility-administered medications for this visit.    REVIEW OF SYSTEMS:   Constitutional: Denies fevers, chills or abnormal night sweats, (+) 20 lbs weight loss since 8/15, low appetite  Eyes: (+) blurriness of vision, no double vision or watery eyes Ears, nose, mouth, throat, and face: Denies mucositis or sore throat Respiratory: (+) dyspnea on exertion, Denies cough or  wheezes Cardiovascular: Denies palpitation, chest discomfort or lower extremity swelling Gastrointestinal:  Denies nausea, heartburn or change in bowel habits Skin: Denies abnormal skin rashes Lymphatics: Denies new lymphadenopathy or easy bruising Neurological:Denies numbness, tingling or new weaknesses Behavioral/Psych: Mood is stable, no new changes  All other systems were reviewed with the patient and are negative.  PHYSICAL EXAMINATION: ECOG PERFORMANCE STATUS: 1 - Symptomatic but completely ambulatory BP 111/47 mmHg  Pulse 72  Temp(Src) 98.2 F (36.8 C) (Oral)  Resp 18  Ht $R'5\' 7"'KT$  (1.702 m)  Wt 234 lb 4.8 oz (106.278 kg)  BMI 36.69 kg/m2  SpO2 99%  GENERAL:alert, no distress and comfortable SKIN: skin color, texture, turgor are normal, no rashes or significant lesions EYES: normal, conjunctiva are pink and non-injected, sclera clear OROPHARYNX:no exudate, no erythema and lips, buccal mucosa, and tongue normal  NECK: supple, thyroid normal size, non-tender, without nodularity LYMPH:  no palpable lymphadenopathy in the cervical, axillary or inguinal LUNGS: clear to auscultation and percussion with normal breathing effort HEART: regular rate & rhythm and no murmurs and no lower extremity edema ABDOMEN:abdomen soft, nontender, no rebound pain normal bowel sounds Musculoskeletal:no cyanosis of digits and no clubbing  PSYCH: alert & oriented x 3 with fluent speech NEURO: no focal motor/sensory deficits Breasts: Breast inspection showed them to be symmetrical with no nipple discharge. Surgical scar at the left upper breast and axillary area, healing well without surrounding erythema or discharge. Palpation of the right breasts and axilla revealed no obvious mass that I could appreciate. The left breast the skin is slightly pinkish, and tender to touch.  I have reviewed the data as listed LABORATORY DATA:  CBC Latest Ref Rng 10/13/2014 07/20/2014 07/07/2014  WBC 3.9 - 10.3 10e3/uL 7.2 7.7  6.6  Hemoglobin 11.6 - 15.9 g/dL 13.0 12.8 12.2  Hematocrit 34.8 - 46.6 % 40.7 41.0 37.1  Platelets 145 - 400  10e3/uL 265 296 259    CMP Latest Ref Rng 10/13/2014 07/20/2014 07/07/2014  Glucose 70 - 140 mg/dl 120 139(H) 108  BUN 7.0 - 26.0 mg/dL 23.8 19 20.5  Creatinine 0.6 - 1.1 mg/dL 1.2(H) 1.00 1.2(H)  Sodium 136 - 145 mEq/L 142 139 141  Potassium 3.5 - 5.1 mEq/L 4.2 3.7 4.2  Chloride 101 - 111 mmol/L - 103 -  CO2 22 - 29 mEq/L _0 Calcium 8.4 - 10.4 mg/dL 10.1 9.8 9.3  Total Protein 6.4 - 8.3 g/dL 7.6 8.2(H) 7.2  Total Bilirubin 0.20 - 1.20 mg/dL 0.47 0.6 0.44  Alkaline Phos 40 - 150 U/L 54 51 51  AST 5 - 34 U/L 25 38 35(H)  ALT 0 - 55 U/L 33 46 56(H)     PATHOLOGY REPORT Diagnosis 12/29/2013 1. Breast, lumpectomy, left - INVASIVE GRADE II DUCTAL CARCINOMA, SPANNING 1.7 CM IN GREATEST DIMENSION. - SECOND FOCUS OF INVASIVE GRADE I DUCTAL CARCINOMA, SPANNING 0.5 CM IN GREATEST DIMENSION. - INTERMEDIATE GRADE DUCTAL CARCINOMA IN SITU ASSOCIATED WITH BOTH FOCI OF TUMOR. - INVASIVE DUCTAL CARCINOMA IS EXTREMELY CLOSE (LESS THAN 0.1 CM) TO INFERIOR MARGIN. - DUCTAL CARCINOMA IN SITU IS CLOSE (0.1 CM) TO INFERIOR MARGIN. - OTHER MARGINS NEGATIVE. - SEE ONCOLOGY TEMPLATE. 2. Lymph node, sentinel, biopsy, Left axillary 1 of 4 FINAL for LATONA, KRICHBAUM ([id number]) Diagnosis(continued) - ONE BENIGN LYMPH NODE WITH NO TUMOR SEEN (0/1). 3. Lymph node, sentinel, biopsy, Left axilla - ONE BENIGN LYMPH NODE WITH NO TUMOR SEEN (0/1). Microscopic Comment 1. BREAST, INVASIVE TUMOR, WITH LYMPH NODES PRESENT Specimen, including laterality and lymph node sampling (sentinel, non-sentinel): Left partial breast with left sentinel lymph node sampling. Procedure: Left breast lumpectomy with sentinel lymph node biopsies. Histologic type: Invasive ductal carcinoma. Larger focus: Grade: II. Tubule formation: 2. Nuclear pleomorphism: 2. Mitotic: 3. Smaller focus (near inferior  margin): Grade: I. Tubule formation: 1. Nuclear pleomorphism: 2. Mitotic: 1. Tumor sizes (gross and glass slide measurement): 1.7 cm and 0.5 cm. Margins: Invasive, distance to closest margin: less than 0.1 cm (inferior margin). In-situ, distance to closest margin: 0.1 cm (inferior margin). Lymphovascular invasion: Definitive lymphovascular invasion is not identified. Ductal carcinoma in situ: Yes. Grade: Intermediate grade. Extensive intraductal component: There is an extensive intraductal component of ductal carcinoma in situ on the smaller focus but not on the larger focus. Lobular neoplasia: No. Tumor focality: Two foci, multifocal. Treatment effect: N/A. Extent of tumor: Tumor confined to breast parenchyma. Skin: Not received. Nipple: Not received. Skeletal muscle: Not received. Lymph nodes: Examined: 2 Sentinel. 0 Non-sentinel. 2 Total. Lymph nodes with metastasis: 0. Breast prognostic profile: Performed on previous case ([id number]). Estrogen receptor: 100%, positive. Progesterone receptor: 96%, positive. Her 2 neu by CISH: 0.81 ratio, no amplification. Ki-67: 73%. Non-neoplastic breast: Fat necrosis. TNM: pT1c(m), pN0, MX. Comments: A smooth muscle myosin, calponin, and p63 immunohistochemical stain are performed on a single block, which helps to confirm the presence of a second focus of invasive ductal carcinoma associated 2 of 4 FINAL for KENLY, HENCKEL ([id number]) Microscopic Comment(continued) with ductal carcinoma in situ. As Her-2 neu by CISH was negative on the initial biopsy, this will be repeated on the larger tumor focus and reported in an addendum to follow. A breast prognostic profile will not be performed on the smaller tumor focus unless otherwise requested (the tumor foci although different grades show morphologically similar nuclear features). (RH:ds 12/31/13)  Oncotype DX Score: 23 (Intermediate risk) (ER+/PR+/HER2-)  RADIOGRAPHIC  STUDIES:  I have personally reviewed the radiological images as listed and agreed with the findings in the report.   MM digital Diagnostic and [place] 11/23/13 Ultrasound is performed, showing angulated hypoechoic mass at the left breast 10 o'clock 18 cm from nipple measuring 1.82 x 0.71 x 0.88 cm. Ultrasound of the left axilla is negative.   ASSESSMENT & PLAN:  64 year old Caucasian female, with past medical history of hypertension, diabetes, dyslipidemia, obesity who was found to have a left breast mass by screening mammogram.  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 Grade 2, Ki-67 73%, status post lumpectomy with close margin. -She is status post complete surgical resection with lumpectomy. -Her breast cancer is likely cured by surgery alone, but does has some risk of disease recurrence in the future. -I discussed the Oncotype DX result with her and her husband. The recurrence score is 23, with the predicted 10 year recurrence risk of 15% with tamoxifen alone. The benefit of chemotherapy is uncertain in this intermediate risk group. I do not strongly recommend adjuvant chemotherapy. Patient agree with not having chemotherapy.  -Due to the strong positivity of ER and PR, I recommend adjuvant hormonal therapy to reduce the risk of cancer recurrence in the future, which includes local and distant recurrence. Given her post menopause status, I recommend aromatase inhibitor -She has been tolerating Aromasin well, will continue, plan for a total of 5 years.  -Annual mammogram, due in Nov   #2 Bone health -Her recent bone density scan was normal. -We discussed Aromasin may weak her bone, she will continue calcium and vitamin D.  #3. hypertension, diabetes, dyslipidemia, chronic low back pain -She will continue follow-up with her primary care physician.  #4 left breast pain and tenderness -possible related to surgery and  radiation -increase neurontin to 386m in am and noon, 6082mat night   #5 Morbid obesity -We discussed healthy diet and regular exercise. She is willing to try.  #6 new right lung nodule -She had a CT abdomen and pelvis in June 2016, which incidentally found a 4 mm nodule in the right lower lobe. I personally reviewed the image.  -She never smoked, risk of lung cancer is low  -Giving her early stage breast cancer , metastatic lesion is possible, but less likely.  -I'll follow-up with a repeated CT chest in 2 months.   PLAN: -continue exemastane -CT chest without contrast, mammogram in 2 months  -Return to clinic in 2 months   All questions were answered. The patient knows to call the clinic with any problems, questions or concerns. I spent 20 minutes counseling the patient face to face. The total time spent in the appointment was 25 minutes and more than 50% was on counseling.     FeTruitt MerleMD 10/13/2014 10:50 AM

## 2014-10-13 NOTE — Telephone Encounter (Signed)
Gave adn printed appt sched adn avs for pt for NOV

## 2014-11-01 ENCOUNTER — Encounter: Payer: Self-pay | Admitting: Neurology

## 2014-11-01 ENCOUNTER — Ambulatory Visit (INDEPENDENT_AMBULATORY_CARE_PROVIDER_SITE_OTHER): Payer: Medicaid Other | Admitting: Neurology

## 2014-11-01 VITALS — BP 122/72 | HR 72 | Ht 67.0 in | Wt 237.0 lb

## 2014-11-01 DIAGNOSIS — H8111 Benign paroxysmal vertigo, right ear: Secondary | ICD-10-CM | POA: Diagnosis not present

## 2014-11-01 DIAGNOSIS — M5416 Radiculopathy, lumbar region: Secondary | ICD-10-CM | POA: Diagnosis not present

## 2014-11-01 DIAGNOSIS — Z853 Personal history of malignant neoplasm of breast: Secondary | ICD-10-CM

## 2014-11-01 MED ORDER — OXYCODONE-ACETAMINOPHEN 5-325 MG PO TABS: 1.0000 | ORAL_TABLET | ORAL | Status: DC | PRN

## 2014-11-01 NOTE — Progress Notes (Signed)
Chief Complaint  Patient presents with  . Dizziness    She is here with her husband, Fritz Pickerel. She feels her dizziness has improved since starting repositional exercises at home.  She is still using meclizine but only in the evening.  She would like to go over her MRI.      PATIENT: Rachel Herman DOB: [birth year]  Chief Complaint  Patient presents with  . Dizziness    She is here with her husband, Fritz Pickerel. She feels her dizziness has improved since starting repositional exercises at home.  She is still using meclizine but only in the evening.  She would like to go over her MRI.     HISTORICAL  Rachel Herman is a 64 years old right-handed female, seen in refer by  her primary care physician Boykin Nearing, MD for evaluation of dizziness  She has history of left breast cancer, status post left lobectomy followed by radiation, hypertension diabetes  She presented with dizziness in July 15 2014, at nighttime, she turned and lie down at her right side, noticed acute onset of vertigo, nausea, unsteady gait, she has to be helped by her husband to get up, symptoms gradually improved up she lied down still,  But since the initial event, she has recurrent episode of transient dizziness, especially when she lying to her right side, improved if she lies on her left side, also triggered by sudden positional change. She denied hearing loss, no tinnitus.  She denies significant gait difficulty  UPDATE Sep 27th 2016:  She has tried repositional maneuver at home, which has helped, right side first. We have personally reviewed MRI brain in September 2016 that was normal.  Today her main concern is more than one year history of severe right-sided low back pain, radiating pain to her right hip, right lower extremity, worsening constipation, 10 out of 10 sometimes, she has tried Ibuprofen, could not tolerate due to significant GI side effect, she been taking frequent Tylenol, tramadol, Neurontin 300  mg 4 tablets daily provides limited help   REVIEW OF SYSTEMS: Full 14 system review of systems performed and notable only for as above  ALLERGIES: No Known Allergies  HOME MEDICATIONS: Current Outpatient Prescriptions  Medication Sig Dispense Refill  . ACCU-CHEK FASTCLIX LANCETS MISC 1 Units by Percutaneous route 4 (four) times daily. 100 each 12  . acetaminophen (TYLENOL) 500 MG tablet Take 1 tablet (500 mg total) by mouth every 8 (eight) hours as needed. 90 tablet 5  . Ascorbic Acid (VITAMIN C) 1000 MG tablet Take 1,000 mg by mouth daily.    Marland Kitchen aspirin 325 MG tablet Take 325 mg by mouth once.    . Blood Glucose Monitoring Suppl (ACCU-CHEK NANO SMARTVIEW) W/DEVICE KIT 1 kit by Subdermal route as directed. Check blood sugars for fasting, and two hours after breakfast, lunch and dinner (4 checks daily) 1 kit 0  . Docusate Sodium (DSS) 100 MG CAPS Take 100 mg by mouth 2 (two) times daily. (Patient taking differently: Take 200 mg by mouth every evening. ) 30 each 3  . exemestane (AROMASIN) 25 MG tablet Take 1 tablet (25 mg total) by mouth daily after breakfast. 30 tablet 3  . gabapentin (NEURONTIN) 300 MG capsule 300 mg during the day, 600 mg at night by mouth 270 capsule 1  . glucose blood (ACCU-CHEK SMARTVIEW) test strip 1 each by Other route 3 (three) times daily. ICD 10 E11.9 100 each 12  . hydrochlorothiazide (HYDRODIURIL) 25 MG tablet Take 1 tablet (  25 mg total) by mouth daily. 90 tablet 3  . Lancets (ACCU-CHEK MULTICLIX) lancets 1 each by Other route 3 (three) times daily. ICD 10 E11.9 100 each 12  . lisinopril (PRINIVIL,ZESTRIL) 20 MG tablet Take 1 tablet (20 mg total) by mouth daily. 90 tablet 3  . magnesium hydroxide (MILK OF MAGNESIA) 400 MG/5ML suspension Take 30 mLs by mouth as needed for mild constipation.    . meclizine (ANTIVERT) 25 MG tablet Take 1 tablet (25 mg total) by mouth 3 (three) times daily as needed for dizziness. 60 tablet 2  . metFORMIN (GLUCOPHAGE) 500 MG tablet Take  1 tablet (500 mg total) by mouth 2 (two) times daily. 180 tablet 3  . methocarbamol (ROBAXIN) 500 MG tablet Take 1 tablet (500 mg total) by mouth at bedtime as needed for muscle spasms. 30 tablet 3  . Multiple Vitamins-Minerals (MULTIVITAMIN WITH MINERALS) tablet Take 1 tablet by mouth daily.    . ondansetron (ZOFRAN) 8 MG tablet Take 1 tablet (8 mg total) by mouth every 8 (eight) hours as needed for nausea or vomiting. 30 tablet 0  . traMADol (ULTRAM) 50 MG tablet Take 0.5-1 tablets (25-50 mg total) by mouth every 8 (eight) hours as needed. 90 tablet 2  . vitamin D, CHOLECALCIFEROL, 400 UNITS tablet Take 400 Units by mouth daily.     No current facility-administered medications for this visit.    PAST MEDICAL HISTORY: Past Medical History  Diagnosis Date  . Obesity, morbid, BMI 40.0-49.9   . Hypertension 2014  . Hyperlipidemia   . PONV (postoperative nausea and vomiting)   . S/P radiation therapy 04/05/14-05/20/14    left breast 60.4Gy totaldose  . Diabetes mellitus 09/16/13    Diagnosed on 09/16/13; HgA1C was 7.2/metformin  . Breast cancer 12/09/13    left breast Invasive Ductal Carcinoma  . Dizziness     PAST SURGICAL HISTORY: Past Surgical History  Procedure Laterality Date  . Abdominal hysterectomy N/A 09/20/2013    Procedure: HYSTERECTOMY ABDOMINAL;  Surgeon: Osborne Oman, MD;  Location: [facility];  Service: Gynecology;  Laterality: N/A;  . Salpingoophorectomy  09/20/2013    Procedure: SALPINGO OOPHORECTOMY;  Surgeon: Osborne Oman, MD;  Location: [facility];  Service: Gynecology;;  . Laparoscopic assisted vaginal hysterectomy  09/20/2013    Procedure: LAPAROSCOPIC ASSISTED VAGINAL HYSTERECTOMY;  Surgeon: Osborne Oman, MD;  Location: [facility];  Service: Gynecology;;  PT WAS EXAMINED WHILE UP IN STIRRUPS AND IT WAS DECIDED TO OPEN PT DUE TO LARGE MASS  . Breast lumpectomy with axillary lymph node biopsy Left 12/29/13    left breast   . Radioactive seed guided mastectomy with  axillary sentinel lymph node biopsy Left 12/29/2013    Procedure: LEFT BREAST RADIOACTIVE SEED LOCALIZED LUMPECTOMY WITH SENTINEL LYMPH NODE MAPPING;  Surgeon: Erroll Luna, MD;  Location: [facility];  Service: General;  Laterality: Left;  . Re-excision of breast lumpectomy Left 03/02/2014    Procedure: RE-EXCISION OF LEFT BREAST LUMPECTOMY;  Surgeon: Erroll Luna, MD;  Location: [facility];  Service: General;  Laterality: Left;    FAMILY HISTORY: Family History  Problem Relation Age of Onset  . Diabetes Mother   . Multiple myeloma Mother 21  . Hearing loss Mother   . Diabetes Sister   . Diabetes Brother   . Cancer Brother 40    prostate cancer   . Diabetes Son   . Diabetes Maternal Aunt   . Leukemia Maternal Aunt     dx  in her 1s  . Alcoholism Brother   . Heart attack Father     SOCIAL HISTORY:  Social History   Social History  . Marital Status: Married    Spouse Name: Huldah Marin   . Number of Children: 3  . Years of Education: 12   Occupational History  .  Unemployed   Social History Main Topics  . Smoking status: Never Smoker   . Smokeless tobacco: Never Used  . Alcohol Use: No  . Drug Use: No  . Sexual Activity: Not Currently    Birth Control/ Protection: Post-menopausal   Other Topics Concern  . Not on file   Social History Narrative   Married to [employer] in 1986.    Has 3 children from previous marriage, 1 in [place], 1 in [place], 1 in prison ([place]).    Lives with husband.    Right-handed.   1 cup caffeine per day.        PHYSICAL EXAM   Filed Vitals:   11/01/14 0830  BP: 122/72  Pulse: 72  Height: _0  (1.702 m)  Weight: 237 lb (107.502 kg)    Not recorded      Body mass index is 37.11 kg/(m^2).  PHYSICAL EXAMNIATION:  Gen: NAD, conversant, well nourised, obese, well groomed                     Cardiovascular: Regular rate rhythm, no peripheral edema, warm, nontender. Eyes:  Conjunctivae clear without exudates or hemorrhage Neck: Supple, no carotid bruise. Pulmonary: Clear to auscultation bilaterally   NEUROLOGICAL EXAM:  MENTAL STATUS: Speech:    Speech is normal; fluent and spontaneous with normal comprehension.  Cognition:     Orientation to time, place and person     Normal recent and remote memory     Normal Attention span and concentration     Normal Language, naming, repeating,spontaneous speech     Fund of knowledge   CRANIAL NERVES: CN II: Visual fields are full to confrontation. Fundoscopic exam is normal with sharp discs and no vascular changes. Pupils are round equal and briskly reactive to light. CN III, IV, VI: extraocular movement are normal. No ptosis. CN V: Facial sensation is intact to pinprick in all 3 divisions bilaterally. Corneal responses are intact.  CN VII: Face is symmetric with normal eye closure and smile. CN VIII: Hearing is normal to rubbing fingers CN IX, X: Palate elevates symmetrically. Phonation is normal. CN XI: Head turning and shoulder shrug are intact CN XII: Tongue is midline with normal movements and no atrophy.  MOTOR: There is no pronator drift of out-stretched arms. Muscle bulk and tone are normal. Muscle strength is normal.  REFLEXES: Reflexes are 2+ and symmetric at the biceps, triceps, knees, and ankles. Plantar responses are flexor.  SENSORY: Intact to light touch, pinprick, position sense, and vibration sense are intact in fingers and toes.  COORDINATION: Rapid alternating movements and fine finger movements are intact. There is no dysmetria on finger-to-nose and heel-knee-shin.    GAIT/STANCE: She needs push up from chair arm to get up from seated position, obese, mildly unsteady,  Repositional maneuver: Right ear dependent position induced downward rotatory nystagmus, habituate quickly,   DIAGNOSTIC DATA (LABS, IMAGING, TESTING) - I reviewed patient records, labs, notes, testing and imaging  myself where available.   ASSESSMENT AND PLAN  Rachel Herman is a 64 y.o. female with vascular risk factor of obesity, hypertension hyperlipidemia, diabetes, history of breast cancer,  status post left lobectomy, radiation therapy, presenting with acute onset of dizziness since July 15 2014, right ear dependent position induce rotatory downward beating nystagmus,   Benign positional vertigo.  Much improved with  repositional maneuver Right lumbar radiculopathy  MRI lumbar  EMG nerve conduction study  Back stretching exercise  Percocet as needed, continue tramadol, Neurontin, ibuprofen as needed   Marcial Pacas, M.D. Ph.D.  [facility] [address] [place], [place] [address] Ph: [phone] Fax: [phone]  CC: Referring Provider

## 2014-11-02 ENCOUNTER — Other Ambulatory Visit: Payer: Self-pay | Admitting: Neurology

## 2014-11-11 ENCOUNTER — Telehealth: Payer: Self-pay | Admitting: Neurology

## 2014-11-11 ENCOUNTER — Ambulatory Visit
Admission: RE | Admit: 2014-11-11 | Discharge: 2014-11-11 | Disposition: A | Discharge status: Home / Self Care | Payer: Medicaid Other | Source: Ambulatory Visit | Attending: Neurology | Admitting: Neurology

## 2014-11-11 DIAGNOSIS — M5416 Radiculopathy, lumbar region: Secondary | ICD-10-CM

## 2014-11-11 NOTE — Telephone Encounter (Signed)
Please call patient, MRI of lumbar region showed evidence of lumbar spinal stenosis, compression of right L4 nerve root, I will review MRI findings at her EMG nerve conduction visit   1. 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 2. Mild for age lumbar spine degeneration elsewhere.

## 2014-11-14 ENCOUNTER — Other Ambulatory Visit: Payer: Self-pay | Admitting: *Deleted

## 2014-11-14 DIAGNOSIS — R42 Dizziness and giddiness: Secondary | ICD-10-CM

## 2014-11-14 MED ORDER — MECLIZINE HCL 25 MG PO TABS: 25.0000 mg | ORAL_TABLET | Freq: Three times a day (TID) | ORAL | Status: DC | PRN

## 2014-11-14 NOTE — Telephone Encounter (Signed)
Spoke to patient - she will keep her NCV/EMG, as planned, and talk with Dr. Krista Blue more about the MRI during her appt.

## 2014-11-17 ENCOUNTER — Telehealth: Payer: Self-pay | Admitting: Family Medicine

## 2014-11-17 NOTE — Telephone Encounter (Signed)
Patient called stating she needs a med refill on lisinopril (PRINIVIL,ZESTRIL) 20 MG tablet. Patient stated she only has enough to last her until Saturday, and said if she does not take it her legs and feet swell up. Please f/u with pt.

## 2014-11-18 ENCOUNTER — Other Ambulatory Visit: Payer: Self-pay | Admitting: Family Medicine

## 2014-11-18 DIAGNOSIS — I152 Hypertension secondary to endocrine disorders: Secondary | ICD-10-CM

## 2014-11-18 MED ORDER — LISINOPRIL 20 MG PO TABS: 20.0000 mg | ORAL_TABLET | Freq: Every day | ORAL | Status: DC

## 2014-11-23 ENCOUNTER — Ambulatory Visit (INDEPENDENT_AMBULATORY_CARE_PROVIDER_SITE_OTHER): Payer: Medicaid Other | Admitting: Neurology

## 2014-11-23 DIAGNOSIS — M48061 Spinal stenosis, lumbar region without neurogenic claudication: Secondary | ICD-10-CM

## 2014-11-23 DIAGNOSIS — M4806 Spinal stenosis, lumbar region: Secondary | ICD-10-CM | POA: Diagnosis not present

## 2014-11-23 DIAGNOSIS — R269 Unspecified abnormalities of gait and mobility: Secondary | ICD-10-CM | POA: Diagnosis not present

## 2014-11-23 DIAGNOSIS — M5416 Radiculopathy, lumbar region: Secondary | ICD-10-CM

## 2014-11-23 DIAGNOSIS — M5441 Lumbago with sciatica, right side: Secondary | ICD-10-CM | POA: Diagnosis not present

## 2014-11-23 MED ORDER — OXYCODONE-ACETAMINOPHEN 5-325 MG PO TABS: 1.0000 | ORAL_TABLET | ORAL | Status: DC | PRN

## 2014-11-23 NOTE — Progress Notes (Signed)
No chief complaint on file.     PATIENT: Rachel Herman DOB: 07-Aug-1950  No chief complaint on file.    HISTORICAL  Rachel Herman is a 64 years old right-handed female, seen in refer by  her primary care physician Boykin Nearing, MD for evaluation of dizziness  She has history of left breast cancer, status post left lobectomy followed by radiation, hypertension diabetes  She presented with dizziness in July 15 2014, at nighttime, she turned and lie down at her right side, noticed acute onset of vertigo, nausea, unsteady gait, she has to be helped by her husband to get up, symptoms gradually improved up she lied down still,  But since the initial event, she has recurrent episode of transient dizziness, especially when she lying to her right side, improved if she lies on her left side, also triggered by sudden positional change. She denied hearing loss, no tinnitus.  She denies significant gait difficulty  She has tried repositional maneuver at home, which has helped, right side first. We have personally reviewed MRI brain in September 2016 that was normal. This is most consistent with a benign positional vertigo    She complains of over one year history of severe right-sided low back pain, radiating pain to her right hip, right lower extremity, worsening constipation, 10 out of 10 sometimes, she has tried Ibuprofen, could not tolerate due to significant GI side effect, she been taking frequent Tylenol, tramadol, Neurontin 300 mg 4 tablets daily provides limited help  Update November 23 2014:   she continue complains severe right-sided low back pain, radiating pain to bilateral lower extremity, right worse than left, failed gabapentin treatment, Percocet was helpful, but she also complains progressive worsening constipations, gait difficulty,  Electrodiagnostic study today confirmed mild bilateral lumbar radiculopathy, right worse than left,  We have reviewed MRI lumbar film  together October 2016: Right paracentral disc herniation at L3-L4 superimposed on facet hypertrophy with severe right lateral recess stenosis (descending right L4 nerve root level) and moderate to severe overall spinal stenosis. Mild for age lumbar spine degeneration elsewhere.  REVIEW OF SYSTEMS: Full 14 system review of systems performed and notable only for as above  ALLERGIES: No Known Allergies  HOME MEDICATIONS: Current Outpatient Prescriptions  Medication Sig Dispense Refill  . ACCU-CHEK FASTCLIX LANCETS MISC 1 Units by Percutaneous route 4 (four) times daily. 100 each 12  . acetaminophen (TYLENOL) 500 MG tablet Take 1 tablet (500 mg total) by mouth every 8 (eight) hours as needed. 90 tablet 5  . Ascorbic Acid (VITAMIN C) 1000 MG tablet Take 1,000 mg by mouth daily.    Marland Kitchen aspirin 325 MG tablet Take 325 mg by mouth once.    . Blood Glucose Monitoring Suppl (ACCU-CHEK NANO SMARTVIEW) W/DEVICE KIT 1 kit by Subdermal route as directed. Check blood sugars for fasting, and two hours after breakfast, lunch and dinner (4 checks daily) 1 kit 0  . Docusate Sodium (DSS) 100 MG CAPS Take 100 mg by mouth 2 (two) times daily. (Patient taking differently: Take 200 mg by mouth every evening. ) 30 each 3  . exemestane (AROMASIN) 25 MG tablet Take 1 tablet (25 mg total) by mouth daily after breakfast. 30 tablet 3  . gabapentin (NEURONTIN) 300 MG capsule 300 mg during the day, 600 mg at night by mouth 270 capsule 1  . glucose blood (ACCU-CHEK SMARTVIEW) test strip 1 each by Other route 3 (three) times daily. ICD 10 E11.9 100 each 12  . hydrochlorothiazide (  HYDRODIURIL) 25 MG tablet Take 1 tablet (25 mg total) by mouth daily. 90 tablet 3  . Lancets (ACCU-CHEK MULTICLIX) lancets 1 each by Other route 3 (three) times daily. ICD 10 E11.9 100 each 12  . lisinopril (PRINIVIL,ZESTRIL) 20 MG tablet Take 1 tablet (20 mg total) by mouth daily. 90 tablet 3  . magnesium hydroxide (MILK OF MAGNESIA) 400 MG/5ML  suspension Take 30 mLs by mouth as needed for mild constipation.    . meclizine (ANTIVERT) 25 MG tablet Take 1 tablet (25 mg total) by mouth 3 (three) times daily as needed for dizziness. 60 tablet 2  . metFORMIN (GLUCOPHAGE) 500 MG tablet Take 1 tablet (500 mg total) by mouth 2 (two) times daily. 180 tablet 3  . methocarbamol (ROBAXIN) 500 MG tablet Take 1 tablet (500 mg total) by mouth at bedtime as needed for muscle spasms. 30 tablet 3  . Multiple Vitamins-Minerals (MULTIVITAMIN WITH MINERALS) tablet Take 1 tablet by mouth daily.    . ondansetron (ZOFRAN) 8 MG tablet Take 1 tablet (8 mg total) by mouth every 8 (eight) hours as needed for nausea or vomiting. 30 tablet 0  . oxyCODONE-acetaminophen (PERCOCET) 5-325 MG tablet Take 1 tablet by mouth every 4 (four) hours as needed for severe pain. 60 tablet 0  . traMADol (ULTRAM) 50 MG tablet Take 0.5-1 tablets (25-50 mg total) by mouth every 8 (eight) hours as needed. 90 tablet 2  . vitamin D, CHOLECALCIFEROL, 400 UNITS tablet Take 400 Units by mouth daily.     No current facility-administered medications for this visit.    PAST MEDICAL HISTORY: Past Medical History  Diagnosis Date  . Obesity, morbid, BMI 40.0-49.9   . Hypertension 2014  . Hyperlipidemia   . PONV (postoperative nausea and vomiting)   . S/P radiation therapy 04/05/14-05/20/14    left breast 60.4Gy totaldose  . Diabetes mellitus 09/16/13    Diagnosed on 09/16/13; HgA1C was 7.2/metformin  . Breast cancer 12/09/13    left breast Invasive Ductal Carcinoma  . Dizziness     PAST SURGICAL HISTORY: Past Surgical History  Procedure Laterality Date  . Abdominal hysterectomy N/A 09/20/2013    Procedure: HYSTERECTOMY ABDOMINAL;  Surgeon: Osborne Oman, MD;  Location: [facility];  Service: Gynecology;  Laterality: N/A;  . Salpingoophorectomy  09/20/2013    Procedure: SALPINGO OOPHORECTOMY;  Surgeon: Osborne Oman, MD;  Location: [facility];  Service: Gynecology;;  . Laparoscopic assisted  vaginal hysterectomy  09/20/2013    Procedure: LAPAROSCOPIC ASSISTED VAGINAL HYSTERECTOMY;  Surgeon: Osborne Oman, MD;  Location: [facility];  Service: Gynecology;;  PT WAS EXAMINED WHILE UP IN STIRRUPS AND IT WAS DECIDED TO OPEN PT DUE TO LARGE MASS  . Breast lumpectomy with axillary lymph node biopsy Left 12/29/13    left breast   . Radioactive seed guided mastectomy with axillary sentinel lymph node biopsy Left 12/29/2013    Procedure: LEFT BREAST RADIOACTIVE SEED LOCALIZED LUMPECTOMY WITH SENTINEL LYMPH NODE MAPPING;  Surgeon: Erroll Luna, MD;  Location: [facility];  Service: General;  Laterality: Left;  . Re-excision of breast lumpectomy Left 03/02/2014    Procedure: RE-EXCISION OF LEFT BREAST LUMPECTOMY;  Surgeon: Erroll Luna, MD;  Location: [facility];  Service: General;  Laterality: Left;    FAMILY HISTORY: Family History  Problem Relation Age of Onset  . Diabetes Mother   . Multiple myeloma Mother 38  . Hearing loss Mother   . Diabetes Sister   . Diabetes Brother   .  Cancer Brother 40    prostate cancer   . Diabetes Son   . Diabetes Maternal Aunt   . Leukemia Maternal Aunt     dx in her 48s  . Alcoholism Brother   . Heart attack Father     SOCIAL HISTORY:  Social History   Social History  . Marital Status: Married    Spouse Name: Tkeyah Burkman   . Number of Children: 3  . Years of Education: 12   Occupational History  .  Unemployed   Social History Main Topics  . Smoking status: Never Smoker   . Smokeless tobacco: Never Used  . Alcohol Use: No  . Drug Use: No  . Sexual Activity: Not Currently    Birth Control/ Protection: Post-menopausal   Other Topics Concern  . Not on file   Social History Narrative   Married to [employer] in 1986.    Has 3 children from previous marriage, 1 in [place], 1 in [place], 1 in prison ([place]).    Lives with husband.    Right-handed.   1 cup caffeine per day.         PHYSICAL EXAM   There were no vitals filed for this visit.  Not recorded      There is no weight on file to calculate BMI.  PHYSICAL EXAMNIATION:  Gen: NAD, conversant, well nourised, obese, well groomed                     Cardiovascular: Regular rate rhythm, no peripheral edema, warm, nontender. Eyes: Conjunctivae clear without exudates or hemorrhage Neck: Supple, no carotid bruise. Pulmonary: Clear to auscultation bilaterally   NEUROLOGICAL EXAM:  MENTAL STATUS: Speech:    Speech is normal; fluent and spontaneous with normal comprehension.  Cognition:     Orientation to time, place and person     Normal recent and remote memory     Normal Attention span and concentration     Normal Language, naming, repeating,spontaneous speech     Fund of knowledge   CRANIAL NERVES: CN II: Visual fields are full to confrontation. Pupils are round equal and briskly reactive to light. CN III, IV, VI: extraocular movement are normal. No ptosis. CN V: Facial sensation is intact to pinprick in all 3 divisions bilaterally. Corneal responses are intact.  CN VII: Face is symmetric with normal eye closure and smile. CN VIII: Hearing is normal to rubbing fingers CN IX, X: Palate elevates symmetrically. Phonation is normal. CN XI: Head turning and shoulder shrug are intact CN XII: Tongue is midline with normal movements and no atrophy.  MOTOR: There is no pronator drift of out-stretched arms. Muscle bulk and tone are normal. Muscle strength is normal.  REFLEXES: Reflexes are 2+ and symmetric at the biceps, triceps, knees, and ankles. Plantar responses are flexor.  SENSORY: Intact to light touch, pinprick, position sense, and vibration sense are intact in fingers and toes.  COORDINATION: Rapid alternating movements and fine finger movements are intact. There is no dysmetria on finger-to-nose and heel-knee-shin.    GAIT/STANCE: She needs push up from chair arm to get up from seated  position, obese, mildly unsteady, antalgic gait  Repositional maneuver: Right ear dependent position induced downward rotatory nystagmus, habituate quickly,   DIAGNOSTIC DATA (LABS, IMAGING, TESTING) - I reviewed patient records, labs, notes, testing and imaging myself where available.   ASSESSMENT AND PLAN  Fraya SONIA BROMELL is a 64 y.o. female with vascular risk factor of obesity, hypertension  hyperlipidemia, diabetes, history of breast cancer, status post left lobectomy, radiation therapy, presenting with acute onset of dizziness since July 15 2014, right ear dependent position induce rotatory downward beating nystagmus,   Benign positional vertigo.  Much improved with  repositional maneuver Low back pain, radiating pain to bilateral lower extremity  EMG nerve conduction study confirmed mild bilateral lumbar radiculopathy, right worse than left  MRI of lumbar also confirmed moderate to severe spinal stenosis, severe right foraminal stenosis.  Percocet as needed, Neurontin 300 mg 3 times a day  I will refer her to neurosurgeon for lumbar decompression  Marcial Pacas, M.D. Ph.D.  [facility] [address], [place], [place] [address] Ph: [phone] Fax: [phone]  CC: Referring Provider

## 2014-11-23 NOTE — Procedures (Signed)
   NCS (NERVE CONDUCTION STUDY) WITH EMG (ELECTROMYOGRAPHY) REPORT   STUDY DATE: November 23 2014 PATIENT NAME: Rachel Herman DOB: December 08, 1950 MRN: [id number]    TECHNOLOGIST: Laretta Alstrom ELECTROMYOGRAPHER: Marcial Pacas M.D.  CLINICAL INFORMATION:  64 year old female, presenting with progressive worsening right-sided low back pain since August 2016, radiating pain to bilateral lower extremity, right worse than left, worsening constipations.  FINDINGS: NERVE CONDUCTION STUDY: Bilateral sural, peroneal sensory responses were normal. Bilateral tibial motor responses were normal. Left peroneal to EDB motor responses were normal. Right peroneal to EDB motor response showed severely decreased C map amplitude, with preserved distal latency, conduction velocity.  Left tibial H reflexes were present, right tibial H reflexes were absent.   NEEDLE ELECTROMYOGRAPHY: Selected needle examinations was performed at bilateral lower extremity muscles, and bilateral lumbar sacral paraspinal muscles.  Right tibialis anterior: Increased insertional activity, 1 plus spontaneous activity, enlarged complex motor unit potential, with mildly decreased recruitment patterns.   Right tibialis posterior, peroneal longus, medial gastrocnemius: Increased insertional activity, no spontaneous activity, enlarge complex motor unit potential with decreased recruitment patterns.  Right biceps femoris long head: Normal insertion activity,no spontaneous activity, mildly enlarge complex motor unit potential with decreased recruitment patterns.  Left tibialis anterior, tibialis posterior, medial gastrocnemius, vastus lateralis: Normal insertion activity,no spontaneous activity, mildly enlarge motor unit potential with decreased recruitment patterns.  IMPRESSION:   This is an abnormal study. There is electrodiagnostic evidence of chronic bilateral lumbosacral radiculopathies, right worse than left, mainly involving  bilateral L4-5 myotomes.  INTERPRETING PHYSICIAN:   Marcial Pacas M.D. Ph.D. [facility] [address], [place], [place] [address] [phone]

## 2014-11-28 NOTE — Telephone Encounter (Signed)
Rx send to [facility] on 10/14

## 2014-11-29 ENCOUNTER — Other Ambulatory Visit: Payer: Self-pay | Admitting: Neurosurgery

## 2014-12-06 ENCOUNTER — Telehealth: Payer: Self-pay | Admitting: Family Medicine

## 2014-12-06 DIAGNOSIS — M79605 Pain in left leg: Principal | ICD-10-CM

## 2014-12-06 DIAGNOSIS — M545 Low back pain, unspecified: Secondary | ICD-10-CM

## 2014-12-06 NOTE — Telephone Encounter (Signed)
Patient called and requested a med refill for Gabapentin. Please f/u with pt.

## 2014-12-07 ENCOUNTER — Ambulatory Visit (HOSPITAL_COMMUNITY): Payer: Medicare Other

## 2014-12-07 ENCOUNTER — Other Ambulatory Visit (HOSPITAL_BASED_OUTPATIENT_CLINIC_OR_DEPARTMENT_OTHER): Payer: Medicare Other

## 2014-12-07 ENCOUNTER — Telehealth: Payer: Self-pay | Admitting: Hematology

## 2014-12-07 DIAGNOSIS — C50912 Malignant neoplasm of unspecified site of left female breast: Secondary | ICD-10-CM

## 2014-12-07 LAB — CBC WITH DIFFERENTIAL/PLATELET
BASO%: 0.3 % (ref 0.0–2.0)
Basophils Absolute: 0 10*3/uL (ref 0.0–0.1)
EOS%: 1.4 % (ref 0.0–7.0)
Eosinophils Absolute: 0.1 10*3/uL (ref 0.0–0.5)
HCT: 40.8 % (ref 34.8–46.6)
HGB: 13 g/dL (ref 11.6–15.9)
LYMPH%: 23.4 % (ref 14.0–49.7)
MCH: 28.2 pg (ref 25.1–34.0)
MCHC: 31.9 g/dL (ref 31.5–36.0)
MCV: 88.5 fL (ref 79.5–101.0)
MONO#: 0.5 10*3/uL (ref 0.1–0.9)
MONO%: 6.5 % (ref 0.0–14.0)
NEUT#: 5.4 10*3/uL (ref 1.5–6.5)
NEUT%: 68.4 % (ref 38.4–76.8)
Platelets: 277 10*3/uL (ref 145–400)
RBC: 4.61 10*6/uL (ref 3.70–5.45)
RDW: 13.9 % (ref 11.2–14.5)
WBC: 7.9 10*3/uL (ref 3.9–10.3)
lymph#: 1.8 10*3/uL (ref 0.9–3.3)

## 2014-12-07 LAB — COMPREHENSIVE METABOLIC PANEL (CC13)
ALT: 46 U/L (ref 0–55)
AST: 33 U/L (ref 5–34)
Albumin: 3.9 g/dL (ref 3.5–5.0)
Alkaline Phosphatase: 55 U/L (ref 40–150)
Anion Gap: 9 mEq/L (ref 3–11)
BUN: 20.7 mg/dL (ref 7.0–26.0)
CO2: 29 mEq/L (ref 22–29)
Calcium: 10.3 mg/dL (ref 8.4–10.4)
Chloride: 103 mEq/L (ref 98–109)
Creatinine: 1 mg/dL (ref 0.6–1.1)
EGFR: 58 mL/min/{1.73_m2} — ABNORMAL LOW (ref >90)
Glucose: 118 mg/dl (ref 70–140)
Potassium: 4.4 mEq/L (ref 3.5–5.1)
Sodium: 142 mEq/L (ref 136–145)
Total Bilirubin: 0.4 mg/dL (ref 0.20–1.20)
Total Protein: 7.8 g/dL (ref 6.4–8.3)

## 2014-12-07 MED ORDER — GABAPENTIN 300 MG PO CAPS: ORAL_CAPSULE | ORAL | Status: DC

## 2014-12-07 NOTE — Telephone Encounter (Signed)
pt came in to r/s appt-gave r/s appt time & date

## 2014-12-07 NOTE — Telephone Encounter (Signed)
gabapentin (NEURONTIN) 300 MG capsule

## 2014-12-07 NOTE — Pre-Procedure Instructions (Signed)
Rachel Herman  12/07/2014      [id number] [place], [place] - [address] [place] [address] Phone: [phone] Fax: [place], [place] [address] Phone: [phone] Fax: [phone]    Your procedure is scheduled on 12/14/14  Report to [facility] short stay admitting at 930 per dr A.M.  Call this number if you have problems the morning of surgery:  [phone]   Remember:  Do not eat food or drink liquids after midnight.  Take these medicines the morning of surgery with A SIP OF WATER gabapentin, pain med if needed  STOP all herbel meds, nsaids (aleve,naproxen,advil,ibuprofen) 5 days prior to surgery starting 12/09/14 including aspirin, vit C, multi vit, vit D  How to Manage Your Diabetes Before Surgery   Why is it important to control my blood sugar before and after surgery?   Improving blood sugar levels before and after surgery helps healing and can limit problems.  A way of improving blood sugar control is eating a healthy diet by:  - Eating less sugar and carbohydrates  - Increasing activity/exercise  - Talk with your doctor about reaching your blood sugar goals  High blood sugars (greater than 180 mg/dL) can raise your risk of infections and slow down your recovery so you will need to focus on controlling your diabetes during the weeks before surgery.  Make sure that the doctor who takes care of your diabetes knows about your planned surgery including the date and location.  How do I manage my blood sugars before surgery?   Check your blood sugar at least 4 times a day, 2 days before surgery to make sure that they are not too high or low.   Check your blood sugar the morning of your surgery when you wake up and every 2               hours until you get to the Short-Stay unit.  If your blood  sugar is less than 70 mg/dL, you will need to treat for low blood sugar by:  Treat a low blood sugar (less than 70 mg/dL) with 1/2 cup of clear juice (cranberry or apple), 4 glucose tablets, OR glucose gel.  Recheck blood sugar in 15 minutes after treatment (to make sure it is greater than 70 mg/dL).  If blood sugar is not greater than 70 mg/dL on re-check, call [phone] for further instructions.   Report your blood sugar to the Short-Stay nurse when you get to Short-Stay.  References:  [facility], 2007 "How to Manage your Diabetes Before and After Surgery".  What do I do about my diabetes medications?   Do not take oral diabetes medicines (pills) the morning of surgery(.Metformin   Do not wear jewelry, make-up or nail polish.  Do not wear lotions, powders, or perfumes.  You may wear deodorant.  Do not shave 48 hours prior to surgery.  Men may shave face and neck.  Do not bring valuables to the hospital.  [facility] is not responsible for any belongings or valuables.  Contacts, dentures or bridgework may not be worn into surgery.  Leave your suitcase in the car.  After surgery it may be brought to your room.  For patients admitted to the hospital, discharge time will be determined by your treatment team.  Patients  discharged the day of surgery will not be allowed to drive home.   Name and phone number of your driver:    Special instructions:   Special Instructions: [facility] - Preparing for Surgery  Before surgery, you can play an important role.  Because skin is not sterile, your skin needs to be as free of germs as possible.  You can reduce the number of germs on you skin by washing with CHG (chlorahexidine gluconate) soap before surgery.  CHG is an antiseptic cleaner which kills germs and bonds with the skin to continue killing germs even after washing.  Please DO NOT use if you have an allergy to CHG or antibacterial soaps.  If your skin becomes  reddened/irritated stop using the CHG and inform your nurse when you arrive at Short Stay.  Do not shave (including legs and underarms) for at least 48 hours prior to the first CHG shower.  You may shave your face.  Please follow these instructions carefully:   1.  Shower with CHG Soap the night before surgery and the morning of Surgery.  2.  If you choose to wash your hair, wash your hair first as usual with your normal shampoo.  3.  After you shampoo, rinse your hair and body thoroughly to remove the Shampoo.  4.  Use CHG as you would any other liquid soap.  You can apply chg directly  to the skin and wash gently with scrungie or a clean washcloth.  5.  Apply the CHG Soap to your body ONLY FROM THE NECK DOWN.  Do not use on open wounds or open sores.  Avoid contact with your eyes ears, mouth and genitals (private parts).  Wash genitals (private parts)       with your normal soap.  6.  Wash thoroughly, paying special attention to the area where your surgery will be performed.  7.  Thoroughly rinse your body with warm water from the neck down.  8.  DO NOT shower/wash with your normal soap after using and rinsing off the CHG Soap.  9.  Pat yourself dry with a clean towel.            10.  Wear clean pajamas.            11.  Place clean sheets on your bed the night of your first shower and do not sleep with pets.  Day of Surgery  Do not apply any lotions/deodorants the morning of surgery.  Please wear clean clothes to the hospital/surgery center.  Please read over the following fact sheets that you were given. Pain Booklet, Coughing and Deep Breathing, MRSA Information and Surgical Site Infection Prevention

## 2014-12-07 NOTE — Telephone Encounter (Signed)
Rx refills [facility] to [facility]  Pt aware

## 2014-12-08 ENCOUNTER — Encounter (HOSPITAL_COMMUNITY)
Admission: RE | Admit: 2014-12-08 | Discharge: 2014-12-08 | Disposition: A | Discharge status: Home / Self Care | Payer: Medicare Other | Source: Ambulatory Visit | Attending: Neurosurgery | Admitting: Neurosurgery

## 2014-12-08 ENCOUNTER — Encounter (HOSPITAL_COMMUNITY): Payer: Self-pay

## 2014-12-08 DIAGNOSIS — Z7982 Long term (current) use of aspirin: Secondary | ICD-10-CM | POA: Diagnosis not present

## 2014-12-08 DIAGNOSIS — Z01818 Encounter for other preprocedural examination: Secondary | ICD-10-CM | POA: Diagnosis present

## 2014-12-08 DIAGNOSIS — M5126 Other intervertebral disc displacement, lumbar region: Secondary | ICD-10-CM | POA: Diagnosis not present

## 2014-12-08 DIAGNOSIS — Z923 Personal history of irradiation: Secondary | ICD-10-CM | POA: Diagnosis not present

## 2014-12-08 DIAGNOSIS — E785 Hyperlipidemia, unspecified: Secondary | ICD-10-CM | POA: Diagnosis not present

## 2014-12-08 DIAGNOSIS — I10 Essential (primary) hypertension: Secondary | ICD-10-CM | POA: Diagnosis not present

## 2014-12-08 DIAGNOSIS — K219 Gastro-esophageal reflux disease without esophagitis: Secondary | ICD-10-CM | POA: Diagnosis not present

## 2014-12-08 DIAGNOSIS — R918 Other nonspecific abnormal finding of lung field: Secondary | ICD-10-CM | POA: Diagnosis not present

## 2014-12-08 DIAGNOSIS — E119 Type 2 diabetes mellitus without complications: Secondary | ICD-10-CM | POA: Diagnosis not present

## 2014-12-08 DIAGNOSIS — Z7984 Long term (current) use of oral hypoglycemic drugs: Secondary | ICD-10-CM | POA: Diagnosis not present

## 2014-12-08 DIAGNOSIS — Z01812 Encounter for preprocedural laboratory examination: Secondary | ICD-10-CM | POA: Diagnosis not present

## 2014-12-08 DIAGNOSIS — C50212 Malignant neoplasm of upper-inner quadrant of left female breast: Secondary | ICD-10-CM | POA: Diagnosis not present

## 2014-12-08 DIAGNOSIS — Z79899 Other long term (current) drug therapy: Secondary | ICD-10-CM | POA: Diagnosis not present

## 2014-12-08 HISTORY — DX: Headache: R51

## 2014-12-08 HISTORY — DX: Headache, unspecified: R51.9

## 2014-12-08 HISTORY — DX: Gastro-esophageal reflux disease without esophagitis: K21.9

## 2014-12-08 LAB — BASIC METABOLIC PANEL
Anion gap: 11 (ref 5–15)
BUN: 20 mg/dL (ref 6–20)
CO2: 27 mmol/L (ref 22–32)
Calcium: 10 mg/dL (ref 8.9–10.3)
Chloride: 101 mmol/L (ref 101–111)
Creatinine, Ser: 1.12 mg/dL — ABNORMAL HIGH (ref 0.44–1.00)
GFR calc Af Amer: 59 mL/min — ABNORMAL LOW (ref >60)
GFR calc non Af Amer: 51 mL/min — ABNORMAL LOW (ref >60)
Glucose, Bld: 115 mg/dL — ABNORMAL HIGH (ref 65–99)
Potassium: 4.4 mmol/L (ref 3.5–5.1)
Sodium: 139 mmol/L (ref 135–145)

## 2014-12-08 LAB — CBC
HCT: 41 % (ref 36.0–46.0)
Hemoglobin: 13 g/dL (ref 12.0–15.0)
MCH: 28.3 pg (ref 26.0–34.0)
MCHC: 31.7 g/dL (ref 30.0–36.0)
MCV: 89.3 fL (ref 78.0–100.0)
Platelets: 279 10*3/uL (ref 150–400)
RBC: 4.59 MIL/uL (ref 3.87–5.11)
RDW: 14.2 % (ref 11.5–15.5)
WBC: 7.8 10*3/uL (ref 4.0–10.5)

## 2014-12-08 LAB — SURGICAL PCR SCREEN
MRSA, PCR: NEGATIVE
Staphylococcus aureus: NEGATIVE

## 2014-12-08 LAB — GLUCOSE, CAPILLARY: Glucose-Capillary: 100 mg/dL — ABNORMAL HIGH (ref 65–99)

## 2014-12-08 NOTE — Progress Notes (Signed)
PCP is Dr Boykin Nearing Denies seeing a cardiologist. Denies ever having a stress test, echo, or card cath. Reports her fasting CBG's run in the 100's.

## 2014-12-09 ENCOUNTER — Ambulatory Visit (HOSPITAL_COMMUNITY)
Admission: RE | Admit: 2014-12-09 | Discharge: 2014-12-09 | Disposition: A | Discharge status: Home / Self Care | Payer: Medicare Other | Source: Ambulatory Visit | Attending: Hematology | Admitting: Hematology

## 2014-12-09 ENCOUNTER — Encounter (HOSPITAL_COMMUNITY): Payer: Self-pay

## 2014-12-09 DIAGNOSIS — C50212 Malignant neoplasm of upper-inner quadrant of left female breast: Secondary | ICD-10-CM | POA: Insufficient documentation

## 2014-12-09 DIAGNOSIS — Z79899 Other long term (current) drug therapy: Secondary | ICD-10-CM | POA: Diagnosis not present

## 2014-12-09 DIAGNOSIS — C50912 Malignant neoplasm of unspecified site of left female breast: Secondary | ICD-10-CM | POA: Diagnosis not present

## 2014-12-09 DIAGNOSIS — Z01818 Encounter for other preprocedural examination: Secondary | ICD-10-CM | POA: Diagnosis not present

## 2014-12-09 DIAGNOSIS — E785 Hyperlipidemia, unspecified: Secondary | ICD-10-CM | POA: Insufficient documentation

## 2014-12-09 DIAGNOSIS — Z7982 Long term (current) use of aspirin: Secondary | ICD-10-CM | POA: Diagnosis not present

## 2014-12-09 DIAGNOSIS — E119 Type 2 diabetes mellitus without complications: Secondary | ICD-10-CM | POA: Insufficient documentation

## 2014-12-09 DIAGNOSIS — K219 Gastro-esophageal reflux disease without esophagitis: Secondary | ICD-10-CM | POA: Insufficient documentation

## 2014-12-09 DIAGNOSIS — I10 Essential (primary) hypertension: Secondary | ICD-10-CM | POA: Diagnosis not present

## 2014-12-09 DIAGNOSIS — Z923 Personal history of irradiation: Secondary | ICD-10-CM | POA: Diagnosis not present

## 2014-12-09 DIAGNOSIS — M5126 Other intervertebral disc displacement, lumbar region: Secondary | ICD-10-CM | POA: Diagnosis not present

## 2014-12-09 DIAGNOSIS — R918 Other nonspecific abnormal finding of lung field: Secondary | ICD-10-CM | POA: Diagnosis not present

## 2014-12-09 DIAGNOSIS — Z01812 Encounter for preprocedural laboratory examination: Secondary | ICD-10-CM | POA: Insufficient documentation

## 2014-12-09 DIAGNOSIS — Z7984 Long term (current) use of oral hypoglycemic drugs: Secondary | ICD-10-CM | POA: Insufficient documentation

## 2014-12-09 LAB — HEMOGLOBIN A1C
Hgb A1c MFr Bld: 6.6 % — ABNORMAL HIGH (ref 4.8–5.6)
Mean Plasma Glucose: 143 mg/dL

## 2014-12-09 NOTE — Progress Notes (Signed)
Anesthesia Chart Review:  Pt is 64 year old female scheduled for L3-4 lumbar laminectomy/ decompression microdiscectomy on 12/14/2014 with Dr. Arnoldo Morale.   PMH includes: HTN, DM, hyperlipidemia, breast cancer, GERD. Never smoker. BMI 36. S/p re-excision L breast lumpectomy 03/02/14. S/p L breast lumpectomy 12/29/13. S/p abdominal hysterectomy 09/20/13.   Medications include: ASA, exemestane, hctz, lisinopril, metformin  Preoperative labs reviewed.  HgbA1c 6.6, glucose 115.   Chest x-ray 07/20/14 reviewed. No active cardiopulmonary disease.   EKG 07/20/14: Ectopic atrial rhythm. Low voltage, precordial leads. Borderline repolarization abnormality. Baseline wander in lead(s) I II III aVR aVL. Since last tracing different P wave morphology - otherwise unchanged.   Reviewed EKG with Dr. Deatra Canter. Pt will need new EKG DOS and further evaluation by her assigned anesthesiologist.   Willeen Cass, FNP-BC [facility]Anesthesiology Phone: [phone] 12/09/2014 3:43 PM

## 2014-12-12 ENCOUNTER — Ambulatory Visit (HOSPITAL_BASED_OUTPATIENT_CLINIC_OR_DEPARTMENT_OTHER): Payer: Medicare Other | Admitting: Hematology

## 2014-12-12 ENCOUNTER — Encounter: Payer: Self-pay | Admitting: Hematology

## 2014-12-12 ENCOUNTER — Telehealth: Payer: Self-pay | Admitting: Hematology

## 2014-12-12 VITALS — BP 128/76 | HR 87 | Temp 97.8°F | Resp 19 | Ht 67.0 in | Wt 234.3 lb

## 2014-12-12 DIAGNOSIS — R911 Solitary pulmonary nodule: Secondary | ICD-10-CM

## 2014-12-12 DIAGNOSIS — Z79811 Long term (current) use of aromatase inhibitors: Secondary | ICD-10-CM

## 2014-12-12 DIAGNOSIS — C50212 Malignant neoplasm of upper-inner quadrant of left female breast: Secondary | ICD-10-CM | POA: Diagnosis not present

## 2014-12-12 DIAGNOSIS — E041 Nontoxic single thyroid nodule: Secondary | ICD-10-CM

## 2014-12-12 DIAGNOSIS — N644 Mastodynia: Secondary | ICD-10-CM

## 2014-12-12 DIAGNOSIS — Z170 Estrogen receptor positive status [ER+]: Secondary | ICD-10-CM | POA: Diagnosis not present

## 2014-12-12 MED ORDER — EXEMESTANE 25 MG PO TABS: 25.0000 mg | ORAL_TABLET | Freq: Every day | ORAL | Status: DC

## 2014-12-12 NOTE — Telephone Encounter (Signed)
Gave patient avs report and appointments for February 2017 and mammo for January 2017.

## 2014-12-12 NOTE — Progress Notes (Signed)
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 Ultrasound of the left axilla is negative.      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    12/29/2013 Surgery Left lumpectomy with close (<0.1cm) inferior margin   03/02/2014 Surgery Reexcision for close margin, path negative for malignancy.   04/05/2014 - 05/20/2014 Radiation Therapy adjuvant breast radiation    05/27/2014 Imaging Bone density scan: normal    CURRENT THERAPY: Exemestane 25mg  daily, started on 06/07/2014                                    INTERIM HISTORY: She returns for follow up. She is compliant with Aromasin, main side effects is hot flash, which is manageble, she still has breast pain at the incision site. Her back pain has been worse lately, and she is scheduled to have back surgery on 12/22/2014. She is not very active lately due to the back pain. She also has  chronic arthritis pain at the knees and shoulders. No other new complaints.  MEDICAL HISTORY:  Past Medical History  Diagnosis Date  . Obesity, morbid, BMI 40.0-49.9 ([place])   . Hypertension 2014  . Hyperlipidemia   . PONV (postoperative nausea and vomiting)   . S/P radiation therapy 04/05/14-05/20/14    left breast 60.4Gy totaldose  . Diabetes mellitus ([place]) 09/16/13    Diagnosed on 09/16/13; HgA1C was 7.2/metformin  . Breast cancer ([place]) 12/09/13    left breast Invasive Ductal Carcinoma  . Dizziness   . GERD (gastroesophageal reflux disease)   . Headache     SURGICAL HISTORY: Past Surgical History  Procedure Laterality Date  . Abdominal hysterectomy N/A 09/20/2013    Procedure: HYSTERECTOMY ABDOMINAL;  Surgeon: Osborne Oman, MD;  Location: [facility];  Service: Gynecology;  Laterality: N/A;  . Salpingoophorectomy  09/20/2013    Procedure: SALPINGO OOPHORECTOMY;  Surgeon: Osborne Oman, MD;  Location: [facility];  Service: Gynecology;;  . Laparoscopic assisted vaginal hysterectomy  09/20/2013    Procedure: LAPAROSCOPIC ASSISTED VAGINAL HYSTERECTOMY;  Surgeon: Osborne Oman, MD;  Location: [facility];  Service: Gynecology;;  PT WAS EXAMINED WHILE UP IN [place] AND IT WAS DECIDED TO OPEN PT DUE TO LARGE MASS  . Breast lumpectomy with axillary lymph node biopsy Left 12/29/13    left breast   . Radioactive seed  guided mastectomy with axillary sentinel lymph node biopsy Left 12/29/2013    Procedure: LEFT BREAST RADIOACTIVE SEED LOCALIZED LUMPECTOMY WITH SENTINEL LYMPH NODE MAPPING;  Surgeon: Erroll Luna, MD;  Location: [facility];  Service: General;  Laterality: Left;  . Re-excision of breast lumpectomy Left 03/02/2014    Procedure: RE-EXCISION OF LEFT BREAST LUMPECTOMY;  Surgeon: Erroll Luna, MD;  Location: [facility];  Service: General;  Laterality: Left;    SOCIAL HISTORY: Social History   Social History  . Marital Status: Married    Spouse Name: Riya Huxford   . Number of Children: 3  . Years of Education: 12   Occupational History  .  Unemployed   Social History Main Topics  . Smoking status: Never Smoker   . Smokeless tobacco: Never Used  . Alcohol Use: No  . Drug Use: No  . Sexual Activity: Not Currently    Birth Control/ Protection: Post-menopausal   Other Topics Concern  . Not on file   Social History Narrative   Married to [employer] in 1986.    Has 3 children from previous marriage, 1 in [place], 1 in [place], 1 in prison ([place]).    Lives with husband.    Right-handed.   1 cup caffeine per day.       FAMILY HISTORY: Family History  Problem Relation Age of Onset  . Diabetes Mother   . Multiple myeloma Mother 53  . Hearing loss Mother   . Diabetes Sister   . Diabetes Brother   . Cancer Brother 40    prostate cancer   . Diabetes Son   . Diabetes Maternal Aunt   . Leukemia Maternal Aunt     dx in her 36s  . Alcoholism Brother   . Heart attack Father     ALLERGIES:  has No Known Allergies.  MEDICATIONS:  Current Outpatient Prescriptions  Medication Sig Dispense Refill  . ACCU-CHEK FASTCLIX LANCETS MISC 1 Units by Percutaneous route 4 (four) times daily. 100 each 12  . acetaminophen (TYLENOL) 500 MG tablet Take 1 tablet (500 mg total) by mouth every 8 (eight) hours as needed. 90 tablet 5  . Ascorbic Acid (VITAMIN C) 1000 MG tablet Take 1,000 mg by mouth daily.    Marland Kitchen aspirin 325 MG tablet Take 325 mg by mouth once.    . Blood Glucose Monitoring Suppl (ACCU-CHEK NANO SMARTVIEW) W/DEVICE KIT 1 kit by Subdermal route as directed. Check blood sugars for fasting, and two hours after breakfast, lunch and dinner (4 checks daily) 1 kit 0  . Docusate Sodium (DSS) 100 MG CAPS Take 100 mg by mouth 2 (two) times daily. (Patient taking differently: Take 200 mg by mouth every evening. ) 30 each 3  . exemestane (AROMASIN) 25 MG tablet Take 1 tablet (25 mg total) by mouth daily after breakfast. 30 tablet 3   . gabapentin (NEURONTIN) 300 MG capsule 300 mg during the day, 600 mg at night by mouth 270 capsule 1  . glucose blood (ACCU-CHEK SMARTVIEW) test strip 1 each by Other route 3 (three) times daily. ICD 10 E11.9 100 each 12  . hydrochlorothiazide (HYDRODIURIL) 25 MG tablet Take 1 tablet (25 mg total) by mouth daily. 90 tablet 3  . Lancets (ACCU-CHEK MULTICLIX) lancets 1 each by Other route 3 (three) times daily. ICD 10 E11.9 100 each 12  . lisinopril (PRINIVIL,ZESTRIL) 20 MG tablet Take 1 tablet (20 mg total) by mouth daily. 90 tablet 3  . magnesium  hydroxide (MILK OF MAGNESIA) 400 MG/5ML suspension Take 30 mLs by mouth as needed for mild constipation.    . metFORMIN (GLUCOPHAGE) 500 MG tablet Take 1 tablet (500 mg total) by mouth 2 (two) times daily. 180 tablet 3  . methocarbamol (ROBAXIN) 500 MG tablet Take 1 tablet (500 mg total) by mouth at bedtime as needed for muscle spasms. 30 tablet 3  . Multiple Vitamins-Minerals (MULTIVITAMIN WITH MINERALS) tablet Take 1 tablet by mouth daily.    . ondansetron (ZOFRAN) 8 MG tablet Take 1 tablet (8 mg total) by mouth every 8 (eight) hours as needed for nausea or vomiting. 30 tablet 0  . oxyCODONE-acetaminophen (PERCOCET) 5-325 MG tablet Take 1 tablet by mouth every 4 (four) hours as needed for severe pain. 60 tablet 0  . traMADol (ULTRAM) 50 MG tablet Take 0.5-1 tablets (25-50 mg total) by mouth every 8 (eight) hours as needed. 90 tablet 2  . vitamin D, CHOLECALCIFEROL, 400 UNITS tablet Take 400 Units by mouth daily.    . meclizine (ANTIVERT) 25 MG tablet Take 1 tablet (25 mg total) by mouth 3 (three) times daily as needed for dizziness. (Patient not taking: Reported on 12/12/2014) 60 tablet 2   No current facility-administered medications for this visit.    REVIEW OF SYSTEMS:   Constitutional: Denies fevers, chills or abnormal night sweats, (+) 20 lbs weight loss since 8/15, low appetite  Eyes: (+) blurriness of vision, no double vision or watery  eyes Ears, nose, mouth, throat, and face: Denies mucositis or sore throat Respiratory: (+) dyspnea on exertion, Denies cough or wheezes Cardiovascular: Denies palpitation, chest discomfort or lower extremity swelling Gastrointestinal:  Denies nausea, heartburn or change in bowel habits Skin: Denies abnormal skin rashes Lymphatics: Denies new lymphadenopathy or easy bruising Neurological:Denies numbness, tingling or new weaknesses Behavioral/Psych: Mood is stable, no new changes  All other systems were reviewed with the patient and are negative.  PHYSICAL EXAMINATION: ECOG PERFORMANCE STATUS: 1 - Symptomatic but completely ambulatory BP 128/76 mmHg  Pulse 87  Temp(Src) 97.8 F (36.6 C) (Oral)  Resp 19  Ht $R'5\' 7"'hw$  (1.702 m)  Wt 234 lb 4.8 oz (106.278 kg)  BMI 36.69 kg/m2  SpO2 98%  GENERAL:alert, no distress and comfortable SKIN: skin color, texture, turgor are normal, no rashes or significant lesions EYES: normal, conjunctiva are pink and non-injected, sclera clear OROPHARYNX:no exudate, no erythema and lips, buccal mucosa, and tongue normal  NECK: supple, thyroid normal size, non-tender, without nodularity LYMPH:  no palpable lymphadenopathy in the cervical, axillary or inguinal LUNGS: clear to auscultation and percussion with normal breathing effort HEART: regular rate & rhythm and no murmurs and no lower extremity edema ABDOMEN:abdomen soft, nontender, no rebound pain normal bowel sounds Musculoskeletal:no cyanosis of digits and no clubbing  PSYCH: alert & oriented x 3 with fluent speech NEURO: no focal motor/sensory deficits Breasts: Breast inspection showed them to be symmetrical with no nipple discharge. Surgical scar at the left upper breast and axillary area, healing well without surrounding erythema or discharge. Palpation of the right breasts and axilla revealed no obvious mass that I could appreciate. The left breast the skin is slightly pinkish, and tender to touch.  I  have reviewed the data as listed LABORATORY DATA:  CBC Latest Ref Rng 12/08/2014 12/07/2014 10/13/2014  WBC 4.0 - 10.5 K/uL 7.8 7.9 7.2  Hemoglobin 12.0 - 15.0 g/dL 13.0 13.0 13.0  Hematocrit 36.0 - 46.0 % 41.0 40.8 40.7  Platelets 150 - 400 K/uL 279 277  [place] Ref Rng 12/08/2014 12/07/2014 10/13/2014  Glucose 65 - 99 mg/dL 115(H) 118 120  BUN 6 - 20 mg/dL 20 20.7 23.8  Creatinine 0.44 - 1.00 mg/dL 1.12(H) 1.0 1.2(H)  Sodium 135 - 145 mmol/L 139 142 142  Potassium 3.5 - 5.1 mmol/L 4.4 4.4 4.2  Chloride 101 - 111 mmol/L 101 - -  CO2 22 - 32 mmol/L $RemoveB'27 29 27  'ITKGDAVD$ Calcium 8.9 - 10.3 mg/dL 10.0 10.3 10.1  Total Protein 6.4 - 8.3 g/dL - 7.8 7.6  Total Bilirubin 0.20 - 1.20 mg/dL - 0.40 0.47  Alkaline Phos 40 - 150 U/L - 55 54  AST 5 - 34 U/L - 33 25  ALT 0 - 55 U/L - 46 33     PATHOLOGY REPORT Diagnosis 12/29/2013 1. Breast, lumpectomy, left - INVASIVE GRADE II DUCTAL CARCINOMA, SPANNING 1.7 CM IN GREATEST DIMENSION. - SECOND FOCUS OF INVASIVE GRADE I DUCTAL CARCINOMA, SPANNING 0.5 CM IN GREATEST DIMENSION. - INTERMEDIATE GRADE DUCTAL CARCINOMA IN SITU ASSOCIATED WITH BOTH FOCI OF TUMOR. - INVASIVE DUCTAL CARCINOMA IS EXTREMELY CLOSE (LESS THAN 0.1 CM) TO INFERIOR MARGIN. - DUCTAL CARCINOMA IN SITU IS CLOSE (0.1 CM) TO INFERIOR MARGIN. - OTHER MARGINS NEGATIVE. - SEE ONCOLOGY TEMPLATE. 2. Lymph node, sentinel, biopsy, Left axillary 1 of 4 FINAL for AIMY, SWEETING ([id number]) Diagnosis(continued) - ONE BENIGN LYMPH NODE WITH NO TUMOR SEEN (0/1). 3. Lymph node, sentinel, biopsy, Left axilla - ONE BENIGN LYMPH NODE WITH NO TUMOR SEEN (0/1). Microscopic Comment 1. BREAST, INVASIVE TUMOR, WITH LYMPH NODES PRESENT Specimen, including laterality and lymph node sampling (sentinel, non-sentinel): Left partial breast with left sentinel lymph node sampling. Procedure: Left breast lumpectomy with sentinel lymph node biopsies. Histologic type: Invasive ductal carcinoma. Larger  focus: Grade: II. Tubule formation: 2. Nuclear pleomorphism: 2. Mitotic: 3. Smaller focus (near inferior margin): Grade: I. Tubule formation: 1. Nuclear pleomorphism: 2. Mitotic: 1. Tumor sizes (gross and glass slide measurement): 1.7 cm and 0.5 cm. Margins: Invasive, distance to closest margin: less than 0.1 cm (inferior margin). In-situ, distance to closest margin: 0.1 cm (inferior margin). Lymphovascular invasion: Definitive lymphovascular invasion is not identified. Ductal carcinoma in situ: Yes. Grade: Intermediate grade. Extensive intraductal component: There is an extensive intraductal component of ductal carcinoma in situ on the smaller focus but not on the larger focus. Lobular neoplasia: No. Tumor focality: Two foci, multifocal. Treatment effect: N/A. Extent of tumor: Tumor confined to breast parenchyma. Skin: Not received. Nipple: Not received. Skeletal muscle: Not received. Lymph nodes: Examined: 2 Sentinel. 0 Non-sentinel. 2 Total. Lymph nodes with metastasis: 0. Breast prognostic profile: Performed on previous case ([id number]). Estrogen receptor: 100%, positive. Progesterone receptor: 96%, positive. Her 2 neu by CISH: 0.81 ratio, no amplification. Ki-67: 73%. Non-neoplastic breast: Fat necrosis. TNM: pT1c(m), pN0, MX. Comments: A smooth muscle myosin, calponin, and p63 immunohistochemical stain are performed on a single block, which helps to confirm the presence of a second focus of invasive ductal carcinoma associated 2 of 4 FINAL for SEVERINA, SYKORA ([id number]) Microscopic Comment(continued) with ductal carcinoma in situ. As Her-2 neu by CISH was negative on the initial biopsy, this will be repeated on the larger tumor focus and reported in an addendum to follow. A breast prognostic profile will not be performed on the smaller tumor focus unless otherwise requested (the tumor foci although different grades show morphologically similar nuclear  features). (RH:ds 12/31/13)  Oncotype DX Score: 23 (Intermediate risk) (ER+/PR+/HER2-)  RADIOGRAPHIC STUDIES: I have personally  reviewed the radiological images as listed and agreed with the findings in the report.  CT chest wo contrast 12/09/2014 IMPRESSION: Small indeterminate bilateral pulmonary nodules measuring up to 4 mm. Lower lobe nodules visualized on previous abdomen CT remains stable. Continued followup by CT recommended in 6-9 months.  Right thyroid lobe nodules measuring up to 1.7 cm. Thyroid ultrasound should be considered for further evaluation. This follows ACR consensus guidelines: Managing Incidental Thyroid Nodules Detected on Imaging: White Paper of the ACR Incidental Thyroid Findings Committee. J Am Coll Radiol 2015; 12:143-150.  ASSESSMENT & PLAN:  63 year old Caucasian female, with past medical history of hypertension, diabetes, dyslipidemia, obesity who was found to have a left breast mass by screening mammogram.  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 Grade 2, Ki-67 73%, status post lumpectomy with close margin. -She is status post complete surgical resection with lumpectomy. -Her breast cancer is likely cured by surgery alone, but does has some risk of disease recurrence in the future. -I discussed the Oncotype DX result with her and her husband. The recurrence score is 23, with the predicted 10 year recurrence risk of 15% with tamoxifen alone. The benefit of chemotherapy is uncertain in this intermediate risk group. I do not strongly recommend adjuvant chemotherapy. Patient agree with not having chemotherapy.  -Due to the strong positivity of ER and PR, I recommend adjuvant hormonal therapy to reduce the risk of cancer recurrence in the future, which includes local and distant recurrence. Given her post menopause status, I recommend aromatase inhibitor -She has been tolerating Aromasin well, with  mild to moderate hot flash, manageable, will continue, plan for a total of 5 years.  -She is due for annual mammogram, she'll postpone to early January due to the coming back surgery.  #2 Bone health -Her recent bone density scan was normal. -We discussed Aromasin may weak her bone, she will continue calcium and vitamin D.  #3. hypertension, diabetes, dyslipidemia, chronic low back pain -She will continue follow-up with her primary care physician. -she is scheduled for back surgery in a few weeks.  #4 left breast pain and tenderness -possible related to surgery and radiation -continue neurontin to $RemoveBefo'300mg'eUadPUptgZA$  in am and noon, $RemoveB'600mg'EYXNOmOG$  at night   #5 Morbid obesity -We discussed healthy diet and regular exercise. She is willing to try after she recovers well from her back surgery  #6 new right lung nodule -She had a CT abdomen and pelvis in June 2016, which incidentally found a 4 mm nodule in the right lower lobe. I personally reviewed the image.  -She never smoked, risk of lung cancer is low  -Giving her early stage breast cancer , metastatic lesion is possible, but less likely.  -I reviewed her repeated CT chest from last week, which showed stable small lung nodules, likely benign. -We'll continue monitoring, consider repeating CT scan in 6-12 months.  #7. Thyroid nodule -Her CT scan revealed a 1.7 cm right thyroid nodule. -We'll consider ultrasound of thyroid to her back surgery.  PLAN: -continue exemastane -mammogram in early Jan 2017 -Return to clinic in 3 months, consider thyroid US on next visit    All questions were answered. The patient knows to call the clinic with any problems, questions or concerns. I spent 20 minutes counseling the patient face to face. The total time spent in the appointment was 25 minutes and more than 50% was on counseling.     Truitt Merle, MD 12/12/2014 11:05  PM

## 2014-12-13 MED ORDER — CEFAZOLIN SODIUM-DEXTROSE 2-3 GM-% IV SOLR
2.0000 g | INTRAVENOUS | Status: AC
  Administered 2014-12-14: 2 g via INTRAVENOUS
  Filled 2014-12-13: qty 50

## 2014-12-14 ENCOUNTER — Ambulatory Visit: Payer: Medicaid Other | Admitting: Hematology

## 2014-12-14 ENCOUNTER — Other Ambulatory Visit: Payer: Self-pay

## 2014-12-14 ENCOUNTER — Inpatient Hospital Stay (HOSPITAL_COMMUNITY): Payer: Medicare Other | Admitting: Certified Registered Nurse Anesthetist

## 2014-12-14 ENCOUNTER — Encounter (HOSPITAL_COMMUNITY): Payer: Self-pay | Admitting: *Deleted

## 2014-12-14 ENCOUNTER — Inpatient Hospital Stay (HOSPITAL_COMMUNITY): Payer: Medicare Other

## 2014-12-14 ENCOUNTER — Inpatient Hospital Stay (HOSPITAL_COMMUNITY): Payer: Medicare Other | Admitting: Emergency Medicine

## 2014-12-14 ENCOUNTER — Observation Stay (HOSPITAL_COMMUNITY)
Admission: RE | Admit: 2014-12-14 | Discharge: 2014-12-15 | Disposition: A | Discharge status: Home / Self Care | Payer: Medicare Other | Source: Ambulatory Visit | Attending: Neurosurgery | Admitting: Neurosurgery

## 2014-12-14 ENCOUNTER — Encounter (HOSPITAL_COMMUNITY)
Admission: RE | Disposition: A | Discharge status: Home / Self Care | Payer: Self-pay | Source: Ambulatory Visit | Attending: Neurosurgery

## 2014-12-14 DIAGNOSIS — M5126 Other intervertebral disc displacement, lumbar region: Principal | ICD-10-CM | POA: Insufficient documentation

## 2014-12-14 DIAGNOSIS — K219 Gastro-esophageal reflux disease without esophagitis: Secondary | ICD-10-CM | POA: Diagnosis not present

## 2014-12-14 DIAGNOSIS — M48062 Spinal stenosis, lumbar region with neurogenic claudication: Secondary | ICD-10-CM | POA: Diagnosis present

## 2014-12-14 DIAGNOSIS — E119 Type 2 diabetes mellitus without complications: Secondary | ICD-10-CM | POA: Diagnosis not present

## 2014-12-14 DIAGNOSIS — Z9889 Other specified postprocedural states: Secondary | ICD-10-CM | POA: Diagnosis not present

## 2014-12-14 DIAGNOSIS — Z6836 Body mass index (BMI) 36.0-36.9, adult: Secondary | ICD-10-CM | POA: Insufficient documentation

## 2014-12-14 DIAGNOSIS — Z419 Encounter for procedure for purposes other than remedying health state, unspecified: Secondary | ICD-10-CM

## 2014-12-14 DIAGNOSIS — Z7982 Long term (current) use of aspirin: Secondary | ICD-10-CM | POA: Diagnosis not present

## 2014-12-14 DIAGNOSIS — M1990 Unspecified osteoarthritis, unspecified site: Secondary | ICD-10-CM | POA: Insufficient documentation

## 2014-12-14 DIAGNOSIS — Z923 Personal history of irradiation: Secondary | ICD-10-CM | POA: Diagnosis not present

## 2014-12-14 DIAGNOSIS — M4806 Spinal stenosis, lumbar region: Secondary | ICD-10-CM | POA: Insufficient documentation

## 2014-12-14 DIAGNOSIS — I10 Essential (primary) hypertension: Secondary | ICD-10-CM | POA: Insufficient documentation

## 2014-12-14 DIAGNOSIS — M5416 Radiculopathy, lumbar region: Secondary | ICD-10-CM | POA: Diagnosis not present

## 2014-12-14 DIAGNOSIS — E785 Hyperlipidemia, unspecified: Secondary | ICD-10-CM | POA: Insufficient documentation

## 2014-12-14 DIAGNOSIS — M48061 Spinal stenosis, lumbar region without neurogenic claudication: Secondary | ICD-10-CM | POA: Diagnosis present

## 2014-12-14 DIAGNOSIS — Z853 Personal history of malignant neoplasm of breast: Secondary | ICD-10-CM | POA: Diagnosis not present

## 2014-12-14 DIAGNOSIS — M545 Low back pain: Secondary | ICD-10-CM | POA: Diagnosis not present

## 2014-12-14 HISTORY — PX: LUMBAR LAMINECTOMY/DECOMPRESSION MICRODISCECTOMY: SHX5026

## 2014-12-14 LAB — GLUCOSE, CAPILLARY
Glucose-Capillary: 102 mg/dL — ABNORMAL HIGH (ref 65–99)
Glucose-Capillary: 103 mg/dL — ABNORMAL HIGH (ref 65–99)
Glucose-Capillary: 138 mg/dL — ABNORMAL HIGH (ref 65–99)
Glucose-Capillary: 229 mg/dL — ABNORMAL HIGH (ref 65–99)

## 2014-12-14 SURGERY — LUMBAR LAMINECTOMY/DECOMPRESSION MICRODISCECTOMY 1 LEVEL
Anesthesia: General | Site: Back | Laterality: Right

## 2014-12-14 MED ORDER — MENTHOL 3 MG MT LOZG: 1.0000 | LOZENGE | OROMUCOSAL | Status: DC | PRN

## 2014-12-14 MED ORDER — HYDROCHLOROTHIAZIDE 25 MG PO TABS: 25.0000 mg | ORAL_TABLET | Freq: Every day | ORAL | Status: DC

## 2014-12-14 MED ORDER — INSULIN ASPART 100 UNIT/ML ~~LOC~~ SOLN: 0.0000 [IU] | Freq: Three times a day (TID) | SUBCUTANEOUS | Status: DC

## 2014-12-14 MED ORDER — PROPOFOL 10 MG/ML IV BOLUS
INTRAVENOUS | Status: DC | PRN
  Administered 2014-12-14: 200 mg via INTRAVENOUS

## 2014-12-14 MED ORDER — PROPOFOL 10 MG/ML IV BOLUS
INTRAVENOUS | Status: AC
  Filled 2014-12-14: qty 20

## 2014-12-14 MED ORDER — DEXAMETHASONE SODIUM PHOSPHATE 4 MG/ML IJ SOLN
INTRAMUSCULAR | Status: DC | PRN
  Administered 2014-12-14: 4 mg via INTRAVENOUS

## 2014-12-14 MED ORDER — MORPHINE SULFATE (PF) 2 MG/ML IV SOLN: 1.0000 mg | INTRAVENOUS | Status: DC | PRN

## 2014-12-14 MED ORDER — SUGAMMADEX SODIUM 200 MG/2ML IV SOLN: INTRAVENOUS | Status: DC | PRN

## 2014-12-14 MED ORDER — ONDANSETRON HCL 4 MG/2ML IJ SOLN
4.0000 mg | INTRAMUSCULAR | Status: DC | PRN
Start: 2014-12-14 — End: 2014-12-15

## 2014-12-14 MED ORDER — HEMOSTATIC AGENTS (NO CHARGE) OPTIME
TOPICAL | Status: DC | PRN
  Administered 2014-12-14: 1 via TOPICAL

## 2014-12-14 MED ORDER — FENTANYL CITRATE (PF) 100 MCG/2ML IJ SOLN
INTRAMUSCULAR | Status: DC | PRN
  Administered 2014-12-14: 150 ug via INTRAVENOUS
  Administered 2014-12-14 (×2): 50 ug via INTRAVENOUS

## 2014-12-14 MED ORDER — ACETAMINOPHEN 650 MG RE SUPP
650.0000 mg | RECTAL | Status: DC | PRN
Start: 2014-12-14 — End: 2014-12-15

## 2014-12-14 MED ORDER — BUPIVACAINE-EPINEPHRINE (PF) 0.5% -1:200000 IJ SOLN
INTRAMUSCULAR | Status: DC | PRN
  Administered 2014-12-14: 10 mL

## 2014-12-14 MED ORDER — EXEMESTANE 25 MG PO TABS
25.0000 mg | ORAL_TABLET | Freq: Every day | ORAL | Status: DC
  Filled 2014-12-14 (×2): qty 1

## 2014-12-14 MED ORDER — LACTATED RINGERS IV SOLN: INTRAVENOUS | Status: DC

## 2014-12-14 MED ORDER — MIDAZOLAM HCL 2 MG/2ML IJ SOLN
INTRAMUSCULAR | Status: AC
  Filled 2014-12-14: qty 4

## 2014-12-14 MED ORDER — BISACODYL 10 MG RE SUPP: 10.0000 mg | Freq: Every day | RECTAL | Status: DC | PRN

## 2014-12-14 MED ORDER — METFORMIN HCL 500 MG PO TABS
500.0000 mg | ORAL_TABLET | Freq: Two times a day (BID) | ORAL | Status: DC
  Administered 2014-12-14: 500 mg via ORAL
  Filled 2014-12-14: qty 1

## 2014-12-14 MED ORDER — DOCUSATE SODIUM 100 MG PO CAPS
100.0000 mg | ORAL_CAPSULE | Freq: Two times a day (BID) | ORAL | Status: DC
  Administered 2014-12-14: 100 mg via ORAL
  Filled 2014-12-14: qty 1

## 2014-12-14 MED ORDER — SODIUM CHLORIDE 0.9 % IR SOLN
Status: DC | PRN
  Administered 2014-12-14: 14:00:00

## 2014-12-14 MED ORDER — CEFAZOLIN SODIUM-DEXTROSE 2-3 GM-% IV SOLR
2.0000 g | Freq: Three times a day (TID) | INTRAVENOUS | Status: AC
  Administered 2014-12-14 – 2014-12-15 (×2): 2 g via INTRAVENOUS
  Filled 2014-12-14 (×2): qty 50

## 2014-12-14 MED ORDER — SUGAMMADEX SODIUM 200 MG/2ML IV SOLN
INTRAVENOUS | Status: AC
  Filled 2014-12-14: qty 2

## 2014-12-14 MED ORDER — SUCCINYLCHOLINE CHLORIDE 20 MG/ML IJ SOLN
INTRAMUSCULAR | Status: DC | PRN
  Administered 2014-12-14: 120 mg via INTRAVENOUS

## 2014-12-14 MED ORDER — HYDROMORPHONE HCL 1 MG/ML IJ SOLN
INTRAMUSCULAR | Status: AC
  Filled 2014-12-14: qty 1

## 2014-12-14 MED ORDER — LACTATED RINGERS IV SOLN
INTRAVENOUS | Status: DC
  Administered 2014-12-14: 10:00:00 via INTRAVENOUS

## 2014-12-14 MED ORDER — LISINOPRIL 20 MG PO TABS: 20.0000 mg | ORAL_TABLET | Freq: Every day | ORAL | Status: DC

## 2014-12-14 MED ORDER — SUGAMMADEX SODIUM 200 MG/2ML IV SOLN
INTRAVENOUS | Status: DC | PRN
  Administered 2014-12-14: 200 mg via INTRAVENOUS

## 2014-12-14 MED ORDER — PHENOL 1.4 % MT LIQD: 1.0000 | OROMUCOSAL | Status: DC | PRN

## 2014-12-14 MED ORDER — ALUM & MAG HYDROXIDE-SIMETH 200-200-20 MG/5ML PO SUSP: 30.0000 mL | Freq: Four times a day (QID) | ORAL | Status: DC | PRN

## 2014-12-14 MED ORDER — PHENYLEPHRINE HCL 10 MG/ML IJ SOLN
INTRAMUSCULAR | Status: DC | PRN
  Administered 2014-12-14 (×2): 120 ug via INTRAVENOUS

## 2014-12-14 MED ORDER — ONDANSETRON HCL 4 MG PO TABS: 8.0000 mg | ORAL_TABLET | Freq: Three times a day (TID) | ORAL | Status: DC | PRN

## 2014-12-14 MED ORDER — DIAZEPAM 5 MG PO TABS
5.0000 mg | ORAL_TABLET | Freq: Four times a day (QID) | ORAL | Status: DC | PRN
  Administered 2014-12-14 (×2): 5 mg via ORAL
  Filled 2014-12-14: qty 1

## 2014-12-14 MED ORDER — OXYCODONE-ACETAMINOPHEN 5-325 MG PO TABS
1.0000 | ORAL_TABLET | ORAL | Status: DC | PRN
  Administered 2014-12-14 – 2014-12-15 (×5): 2 via ORAL
  Filled 2014-12-14 (×5): qty 2

## 2014-12-14 MED ORDER — ONDANSETRON HCL 4 MG/2ML IJ SOLN
INTRAMUSCULAR | Status: DC | PRN
  Administered 2014-12-14: 4 mg via INTRAVENOUS

## 2014-12-14 MED ORDER — HYDROCODONE-ACETAMINOPHEN 5-325 MG PO TABS: 1.0000 | ORAL_TABLET | ORAL | Status: DC | PRN

## 2014-12-14 MED ORDER — GABAPENTIN 300 MG PO CAPS
300.0000 mg | ORAL_CAPSULE | Freq: Three times a day (TID) | ORAL | Status: DC
  Administered 2014-12-14: 300 mg via ORAL
  Filled 2014-12-14: qty 1

## 2014-12-14 MED ORDER — ACETAMINOPHEN 325 MG PO TABS: 650.0000 mg | ORAL_TABLET | ORAL | Status: DC | PRN

## 2014-12-14 MED ORDER — LIDOCAINE HCL (CARDIAC) 20 MG/ML IV SOLN
INTRAVENOUS | Status: DC | PRN
  Administered 2014-12-14: 80 mg via INTRAVENOUS

## 2014-12-14 MED ORDER — PROMETHAZINE HCL 25 MG/ML IJ SOLN: 6.2500 mg | INTRAMUSCULAR | Status: DC | PRN

## 2014-12-14 MED ORDER — FENTANYL CITRATE (PF) 250 MCG/5ML IJ SOLN
INTRAMUSCULAR | Status: AC
  Filled 2014-12-14: qty 5

## 2014-12-14 MED ORDER — MECLIZINE HCL 12.5 MG PO TABS: 25.0000 mg | ORAL_TABLET | Freq: Three times a day (TID) | ORAL | Status: DC | PRN

## 2014-12-14 MED ORDER — PHENYLEPHRINE HCL 10 MG/ML IJ SOLN
10.0000 mg | INTRAVENOUS | Status: DC | PRN
  Administered 2014-12-14: 40 ug/min via INTRAVENOUS

## 2014-12-14 MED ORDER — ROCURONIUM BROMIDE 100 MG/10ML IV SOLN
INTRAVENOUS | Status: DC | PRN
  Administered 2014-12-14: 50 mg via INTRAVENOUS

## 2014-12-14 MED ORDER — HYDROMORPHONE HCL 1 MG/ML IJ SOLN
0.2500 mg | INTRAMUSCULAR | Status: DC | PRN
Start: 2014-12-14 — End: 2014-12-14
  Administered 2014-12-14 (×4): 0.5 mg via INTRAVENOUS

## 2014-12-14 MED ORDER — THROMBIN 5000 UNITS EX SOLR
CUTANEOUS | Status: DC | PRN
  Administered 2014-12-14 (×2): 5000 [IU] via TOPICAL

## 2014-12-14 MED ORDER — BACITRACIN ZINC 500 UNIT/GM EX OINT
TOPICAL_OINTMENT | CUTANEOUS | Status: DC | PRN
  Administered 2014-12-14: 1 via TOPICAL

## 2014-12-14 MED ORDER — INSULIN ASPART 100 UNIT/ML ~~LOC~~ SOLN
0.0000 [IU] | Freq: Every day | SUBCUTANEOUS | Status: DC
  Administered 2014-12-14: 2 [IU] via SUBCUTANEOUS

## 2014-12-14 MED ORDER — INSULIN ASPART 100 UNIT/ML ~~LOC~~ SOLN: 0.0000 [IU] | SUBCUTANEOUS | Status: DC

## 2014-12-14 MED ORDER — DIAZEPAM 5 MG PO TABS
ORAL_TABLET | ORAL | Status: AC
  Filled 2014-12-14: qty 1

## 2014-12-14 SURGICAL SUPPLY — 51 items
BAG DECANTER FOR FLEXI CONT (MISCELLANEOUS) ×2 IMPLANT
BENZOIN TINCTURE PRP APPL 2/3 (GAUZE/BANDAGES/DRESSINGS) ×2 IMPLANT
BLADE CLIPPER SURG (BLADE) IMPLANT
BRUSH SCRUB EZ PLAIN DRY (MISCELLANEOUS) ×2 IMPLANT
BUR MATCHSTICK NEURO 3.0 LAGG (BURR) ×2 IMPLANT
BUR PRECISION FLUTE 6.0 (BURR) ×2 IMPLANT
CANISTER SUCT 3000ML PPV (MISCELLANEOUS) ×2 IMPLANT
DRAPE LAPAROTOMY 100X72X124 (DRAPES) ×2 IMPLANT
DRAPE MICROSCOPE LEICA (MISCELLANEOUS) ×2 IMPLANT
DRAPE POUCH INSTRU U-SHP 10X18 (DRAPES) ×2 IMPLANT
DRAPE SURG 17X23 STRL (DRAPES) ×8 IMPLANT
ELECT BLADE 4.0 EZ CLEAN MEGAD (MISCELLANEOUS) ×2
ELECT REM PT RETURN 9FT ADLT (ELECTROSURGICAL) ×2
ELECTRODE BLDE 4.0 EZ CLN MEGD (MISCELLANEOUS) ×1 IMPLANT
ELECTRODE REM PT RTRN 9FT ADLT (ELECTROSURGICAL) ×1 IMPLANT
GAUZE SPONGE 4X4 12PLY STRL (GAUZE/BANDAGES/DRESSINGS) ×2 IMPLANT
GAUZE SPONGE 4X4 16PLY XRAY LF (GAUZE/BANDAGES/DRESSINGS) IMPLANT
GLOVE BIO SURGEON STRL SZ7 (GLOVE) ×2 IMPLANT
GLOVE BIO SURGEON STRL SZ8 (GLOVE) ×4 IMPLANT
GLOVE BIO SURGEON STRL SZ8.5 (GLOVE) ×2 IMPLANT
GLOVE BIOGEL PI IND STRL 7.0 (GLOVE) ×1 IMPLANT
GLOVE BIOGEL PI IND STRL 7.5 (GLOVE) ×2 IMPLANT
GLOVE BIOGEL PI INDICATOR 7.0 (GLOVE) ×1
GLOVE BIOGEL PI INDICATOR 7.5 (GLOVE) ×2
GLOVE EXAM NITRILE LRG STRL (GLOVE) IMPLANT
GLOVE EXAM NITRILE MD LF STRL (GLOVE) IMPLANT
GLOVE EXAM NITRILE XL STR (GLOVE) IMPLANT
GLOVE EXAM NITRILE XS STR PU (GLOVE) IMPLANT
GOWN STRL REUS W/ TWL LRG LVL3 (GOWN DISPOSABLE) ×1 IMPLANT
GOWN STRL REUS W/ TWL XL LVL3 (GOWN DISPOSABLE) ×1 IMPLANT
GOWN STRL REUS W/TWL 2XL LVL3 (GOWN DISPOSABLE) IMPLANT
GOWN STRL REUS W/TWL LRG LVL3 (GOWN DISPOSABLE) ×1
GOWN STRL REUS W/TWL XL LVL3 (GOWN DISPOSABLE) ×1
KIT BASIN OR (CUSTOM PROCEDURE TRAY) ×2 IMPLANT
KIT ROOM TURNOVER OR (KITS) ×2 IMPLANT
NEEDLE HYPO 21X1.5 SAFETY (NEEDLE) IMPLANT
NEEDLE HYPO 22GX1.5 SAFETY (NEEDLE) ×2 IMPLANT
NS IRRIG 1000ML POUR BTL (IV SOLUTION) ×2 IMPLANT
PACK LAMINECTOMY NEURO (CUSTOM PROCEDURE TRAY) ×2 IMPLANT
PAD ARMBOARD 7.5X6 YLW CONV (MISCELLANEOUS) ×6 IMPLANT
PATTIES SURGICAL .5 X1 (DISPOSABLE) IMPLANT
RUBBERBAND STERILE (MISCELLANEOUS) ×4 IMPLANT
SPONGE SURGIFOAM ABS GEL SZ50 (HEMOSTASIS) ×2 IMPLANT
STRIP CLOSURE SKIN 1/2X4 (GAUZE/BANDAGES/DRESSINGS) ×2 IMPLANT
SUT VIC AB 1 CT1 18XBRD ANBCTR (SUTURE) ×1 IMPLANT
SUT VIC AB 1 CT1 8-18 (SUTURE) ×1
SUT VIC AB 2-0 CP2 18 (SUTURE) ×2 IMPLANT
TAPE CLOTH SURG 4X10 WHT LF (GAUZE/BANDAGES/DRESSINGS) ×2 IMPLANT
TOWEL OR 17X24 6PK STRL BLUE (TOWEL DISPOSABLE) ×2 IMPLANT
TOWEL OR 17X26 10 PK STRL BLUE (TOWEL DISPOSABLE) ×2 IMPLANT
WATER STERILE IRR 1000ML POUR (IV SOLUTION) ×2 IMPLANT

## 2014-12-14 NOTE — Anesthesia Postprocedure Evaluation (Signed)
  Anesthesia Post-op Note  Patient: Rachel Herman  Procedure(s) Performed: Procedure(s) with comments: Right Lumbar three-four microdiskectomy (Right) - Right Lumbar three-four microdiskectomy  Patient Location: PACU  Anesthesia Type:General  Level of Consciousness: awake  Airway and Oxygen Therapy: Patient Spontanous Breathing  Post-op Pain: mild  Post-op Assessment: Post-op Vital signs reviewed   LLE Sensation: Full sensation   RLE Sensation: Full sensation      Post-op Vital Signs: Reviewed  Last Vitals:  Filed Vitals:   12/14/14 1600  BP: 116/70  Pulse:   Temp:   Resp:     Complications: No apparent anesthesia complications

## 2014-12-14 NOTE — Transfer of Care (Signed)
Immediate Anesthesia Transfer of Care Note  Patient: Rachel Herman  Procedure(s) Performed: Procedure(s) with comments: Right Lumbar three-four microdiskectomy (Right) - Right Lumbar three-four microdiskectomy  Patient Location: PACU  Anesthesia Type:General  Level of Consciousness: awake, alert , oriented and patient cooperative  Airway & Oxygen Therapy: Patient Spontanous Breathing and Patient connected to face mask oxygen  Post-op Assessment: Report given to RN, Post -op Vital signs reviewed and stable, Patient moving all extremities, Patient moving all extremities X 4 and Patient able to stick tongue midline  Post vital signs: Reviewed and stable  Last Vitals:  Filed Vitals:   12/14/14 0935  BP: 135/85  Pulse: 71  Temp: 36.5 C  Resp: 18    Complications: No apparent anesthesia complications

## 2014-12-14 NOTE — H&P (Signed)
Subjective: The patient is a 64 year old white female who has complained of back and right leg pain. She has failed medical management and was worked up with a lumbar MRI. This demonstrated a herniated disc at L3-4. I discussed the various treatment options with the patient including surgery. She has weighed the risks, benefits, and alternative surgery and decided proceed with a right L3-4 discectomy.   Past Medical History  Diagnosis Date  . Obesity, morbid, BMI 40.0-49.9 ([place])   . Hypertension 2014  . Hyperlipidemia   . PONV (postoperative nausea and vomiting)   . S/P radiation therapy 04/05/14-05/20/14    left breast 60.4Gy totaldose  . Diabetes mellitus ([place]) 09/16/13    Diagnosed on 09/16/13; HgA1C was 7.2/metformin  . Breast cancer ([place]) 12/09/13    left breast Invasive Ductal Carcinoma  . Dizziness   . GERD (gastroesophageal reflux disease)   . Headache     Past Surgical History  Procedure Laterality Date  . Abdominal hysterectomy N/A 09/20/2013    Procedure: HYSTERECTOMY ABDOMINAL;  Surgeon: Osborne Oman, MD;  Location: [facility];  Service: Gynecology;  Laterality: N/A;  . Salpingoophorectomy  09/20/2013    Procedure: SALPINGO OOPHORECTOMY;  Surgeon: Osborne Oman, MD;  Location: [facility];  Service: Gynecology;;  . Laparoscopic assisted vaginal hysterectomy  09/20/2013    Procedure: LAPAROSCOPIC ASSISTED VAGINAL HYSTERECTOMY;  Surgeon: Osborne Oman, MD;  Location: [facility];  Service: Gynecology;;  PT WAS EXAMINED WHILE UP IN [place] AND IT WAS DECIDED TO OPEN PT DUE TO LARGE MASS  . Breast lumpectomy with axillary lymph node biopsy Left 12/29/13    left breast   . Radioactive seed guided mastectomy with axillary sentinel lymph node biopsy Left 12/29/2013    Procedure: LEFT BREAST RADIOACTIVE SEED LOCALIZED LUMPECTOMY WITH SENTINEL LYMPH NODE MAPPING;  Surgeon: Erroll Luna, MD;  Location: [facility];  Service: General;  Laterality: Left;  . Re-excision of breast  lumpectomy Left 03/02/2014    Procedure: RE-EXCISION OF LEFT BREAST LUMPECTOMY;  Surgeon: Erroll Luna, MD;  Location: [facility];  Service: General;  Laterality: Left;    No Known Allergies  Social History  Substance Use Topics  . Smoking status: Never Smoker   . Smokeless tobacco: Never Used  . Alcohol Use: No    Family History  Problem Relation Age of Onset  . Diabetes Mother   . Multiple myeloma Mother 107  . Hearing loss Mother   . Diabetes Sister   . Diabetes Brother   . Cancer Brother 40    prostate cancer   . Diabetes Son   . Diabetes Maternal Aunt   . Leukemia Maternal Aunt     dx in her 58s  . Alcoholism Brother   . Heart attack Father    Prior to Admission medications   Medication Sig Start Date End Date Taking? Authorizing Provider  ACCU-CHEK FASTCLIX LANCETS MISC 1 Units by Percutaneous route 4 (four) times daily. 09/22/13  Yes Osborne Oman, MD  Ascorbic Acid (VITAMIN C) 1000 MG tablet Take 1,000 mg by mouth daily.   Yes Historical Provider, MD  aspirin 325 MG tablet Take 325 mg by mouth once.   Yes Historical Provider, MD  Blood Glucose Monitoring Suppl (ACCU-CHEK NANO SMARTVIEW) W/DEVICE KIT 1 kit by Subdermal route as directed. Check blood sugars for fasting, and two hours after breakfast, lunch and dinner (4 checks daily) 09/22/13  Yes Osborne Oman, MD  Docusate Sodium (DSS) 100 MG CAPS Take  100 mg by mouth 2 (two) times daily. Patient taking differently: Take 200 mg by mouth every evening.  09/30/13  Yes Osborne Oman, MD  exemestane (AROMASIN) 25 MG tablet Take 1 tablet (25 mg total) by mouth daily after breakfast. 12/12/14  Yes Truitt Merle, MD  gabapentin (NEURONTIN) 300 MG capsule 300 mg during the day, 600 mg at night by mouth 12/07/14  Yes Josalyn Funches, MD  glucose blood (ACCU-CHEK SMARTVIEW) test strip 1 each by Other route 3 (three) times daily. ICD 10 E11.9 06/02/14  Yes Josalyn Funches, MD  hydrochlorothiazide (HYDRODIURIL) 25 MG  tablet Take 1 tablet (25 mg total) by mouth daily. 07/12/14  Yes Josalyn Funches, MD  Lancets (ACCU-CHEK MULTICLIX) lancets 1 each by Other route 3 (three) times daily. ICD 10 E11.9 06/02/14  Yes Josalyn Funches, MD  lisinopril (PRINIVIL,ZESTRIL) 20 MG tablet Take 1 tablet (20 mg total) by mouth daily. 11/18/14  Yes Josalyn Funches, MD  magnesium hydroxide (MILK OF MAGNESIA) 400 MG/5ML suspension Take 30 mLs by mouth as needed for mild constipation.   Yes Historical Provider, MD  meclizine (ANTIVERT) 25 MG tablet Take 1 tablet (25 mg total) by mouth 3 (three) times daily as needed for dizziness. 11/14/14  Yes Josalyn Funches, MD  metFORMIN (GLUCOPHAGE) 500 MG tablet Take 1 tablet (500 mg total) by mouth 2 (two) times daily. 03/30/14  Yes Josalyn Funches, MD  methocarbamol (ROBAXIN) 500 MG tablet Take 1 tablet (500 mg total) by mouth at bedtime as needed for muscle spasms. 10/07/14  Yes Josalyn Funches, MD  Multiple Vitamins-Minerals (MULTIVITAMIN WITH MINERALS) tablet Take 1 tablet by mouth daily.   Yes Historical Provider, MD  ondansetron (ZOFRAN) 8 MG tablet Take 1 tablet (8 mg total) by mouth every 8 (eight) hours as needed for nausea or vomiting. 06/02/14  Yes Boykin Nearing, MD  oxyCODONE-acetaminophen (PERCOCET) 5-325 MG tablet Take 1 tablet by mouth every 4 (four) hours as needed for severe pain. 11/23/14  Yes Marcial Pacas, MD  traMADol (ULTRAM) 50 MG tablet Take 0.5-1 tablets (25-50 mg total) by mouth every 8 (eight) hours as needed. 09/08/14  Yes Josalyn Funches, MD  vitamin D, CHOLECALCIFEROL, 400 UNITS tablet Take 400 Units by mouth daily.   Yes Historical Provider, MD  acetaminophen (TYLENOL) 500 MG tablet Take 1 tablet (500 mg total) by mouth every 8 (eight) hours as needed. 07/12/14   Boykin Nearing, MD     Review of Systems  Positive ROS: As above  All other systems have been reviewed and were otherwise negative with the exception of those mentioned in the HPI and as above.  Objective: Vital  signs in last 24 hours: Temp:  [97.7 F (36.5 C)] 97.7 F (36.5 C) (11/09 0935) Pulse Rate:  [71] 71 (11/09 0935) Resp:  [18] 18 (11/09 0935) BP: (135)/(85) 135/85 mmHg (11/09 0935) SpO2:  [100 %] 100 % (11/09 0935) Weight:  [106.142 kg (234 lb)] 106.142 kg (234 lb) (11/09 0956)  General Appearance: Alert, cooperative, no distress, Head: Normocephalic, without obvious abnormality, atraumatic Eyes: PERRL, conjunctiva/corneas clear, EOM's intact,    Ears: Normal  Throat: Normal  Neck: Supple, symmetrical, trachea midline, no adenopathy; thyroid: No enlargement/tenderness/nodules; no carotid bruit or JVD Back: Symmetric, no curvature, ROM normal, no CVA tenderness Lungs: Clear to auscultation bilaterally, respirations unlabored Heart: Regular rate and rhythm, no murmur, rub or gallop Abdomen: Soft, non-tender,, no masses, no organomegaly Extremities: Extremities normal, atraumatic, no cyanosis or edema Pulses: 2+ and symmetric all extremities Skin: Skin  color, texture, turgor normal, no rashes or lesions  NEUROLOGIC:   Mental status: alert and oriented, no aphasia, good attention span, Fund of knowledge/ memory ok Motor Exam - grossly normal Sensory Exam - grossly normal Reflexes:  Coordination - grossly normal Gait - grossly normal Balance - grossly normal Cranial Nerves: I: smell Not tested  II: visual acuity  OS: Normal  OD: Normal   II: visual fields Full to confrontation  II: pupils Equal, round, reactive to light  III,VII: ptosis None  III,IV,VI: extraocular muscles  Full ROM  V: mastication Normal  V: facial light touch sensation  Normal  V,VII: corneal reflex  Present  VII: facial muscle function - upper  Normal  VII: facial muscle function - lower Normal  VIII: hearing Not tested  IX: soft palate elevation  Normal  IX,X: gag reflex Present  XI: trapezius strength  5/5  XI: sternocleidomastoid strength 5/5  XI: neck flexion strength  5/5  XII: tongue strength   Normal    Data Review Lab Results  Component Value Date   WBC 7.8 12/08/2014   HGB 13.0 12/08/2014   HCT 41.0 12/08/2014   MCV 89.3 12/08/2014   PLT 279 12/08/2014   Lab Results  Component Value Date   NA 139 12/08/2014   K 4.4 12/08/2014   CL 101 12/08/2014   CO2 27 12/08/2014   BUN 20 12/08/2014   CREATININE 1.12* 12/08/2014   GLUCOSE 115* 12/08/2014   No results found for: INR, PROTIME  Assessment/Plan: Right L3-4 herniated disc, lumbago, lumbar radiculopathy: I have discussed situation with the patient. I have reviewed her MRI scan with her and pointed out the abnormalities. We have discussed the various treatment options including surgery. I have described the surgical treatment option of a right L3-4 discectomy. I have shown her surgical models. We have discussed the risks, benefits, alternatives, and likelihood of achieving our goals with surgery. I have answered all the patient's questions. She has decided to proceed with surgery.   Rachel Herman D 12/14/2014 11:47 AM

## 2014-12-14 NOTE — Anesthesia Procedure Notes (Signed)
Procedure Name: Intubation Date/Time: 12/14/2014 12:31 PM Performed by: Shirlyn Goltz Pre-anesthesia Checklist: Patient identified, Emergency Drugs available, Suction available and Patient being monitored Patient Re-evaluated:Patient Re-evaluated prior to inductionOxygen Delivery Method: Circle system utilized Preoxygenation: Pre-oxygenation with 100% oxygen Intubation Type: IV induction Ventilation: Mask ventilation without difficulty Laryngoscope Size: Mac and 3 Grade View: Grade I Tube type: Oral Tube size: 7.0 mm Number of attempts: 1 Airway Equipment and Method: Stylet Placement Confirmation: ETT inserted through vocal cords under direct vision,  positive ETCO2 and breath sounds checked- equal and bilateral Secured at: 21 cm Tube secured with: Tape Dental Injury: Teeth and Oropharynx as per pre-operative assessment

## 2014-12-14 NOTE — Op Note (Signed)
Brief history: The patient is a 64 year old white female who has complained of back and right leg pain consistent with a lumbar radiculopathy. She has failed medical management and was worked up with a lumbar MRI. This demonstrates spinal stenosis/bulging disc at L3-4. I discussed the various treatment options with the patient including surgery. She has weighed the risks, benefits, and alternative surgery and decided proceed with a right L3-4 laminotomy foraminotomy/discectomy.  Preoperative diagnosis: Right L3-4 bulging disc, spinal stenosis, lumbago, lumbar radiculopathy  Postoperative diagnosis: The same  Procedure: Right L3-4 laminotomy and foraminotomy to decompress the right L4 nerve root  using micro-dissection  Surgeon: Dr. Earle Gell  Asst.: Dr. Sherley Bounds  Anesthesia: Gen. endotracheal  Estimated blood loss: Minimal  Drains: None  Complications: None  Description of procedure: The patient was brought to the operating room by the anesthesia team. General endotracheal anesthesia was induced. The patient was turned to the prone position on the Wilson frame. The patient's lumbosacral region was then prepared with Betadine scrub and Betadine solution. Sterile drapes were applied.  I then injected the area to be incised with Marcaine with epinephrine solution. I then used a scalpel to make a linear midline incision over the L3-4 intervertebral disc space. I then used electrocautery to perform a right sided subperiosteal dissection exposing the spinous process and lamina of L3 and L4. We obtained intraoperative radiograph to confirm our location. I then inserted the [facility] retractor for exposure.  We then brought the operative microscope into the field. Under its magnification and illumination we completed the microdissection. I used a high-speed drill to perform a laminotomy at L3-4 on the right. I then used a Kerrison punches to widen the laminotomy and removed the ligamentum  flavum at L3-4 on the right. We then used microdissection to free up the thecal sac and the L4 nerve root from the epidural tissue. I then used a Kerrison punch to perform a foraminotomy at about the right L4 nerve root. We inspected the intervertebral disc at L3-4. It was bulging and somewhat calcified. There were no significant herniations. We did not perform an intervertebral discectomy.  I then palpated along the ventral surface of the thecal sac and along exit route of the right L4 nerve root and noted that the neural structures were well decompressed. This completed the decompression.  We then obtained hemostasis using bipolar electrocautery. We irrigated the wound out with bacitracin solution. We then removed the retractor. We then reapproximated the patient's thoracolumbar fascia with interrupted #1 Vicryl suture. We then reapproximated the patient's subcutaneous tissue with interrupted 2-0 Vicryl suture. We then reapproximated patient's skin with Steri-Strips and benzoin. The was then coated with bacitracin ointment. The drapes were removed. The patient was subsequently returned to the supine position where they were extubated by the anesthesia team. The patient was then transported to the postanesthesia care unit in stable condition. All sponge instrument and needle counts were reportedly correct at the end of this case.

## 2014-12-14 NOTE — Anesthesia Preprocedure Evaluation (Addendum)
Anesthesia Evaluation  Patient identified by MRN, date of birth, ID band Patient awake    Reviewed: Allergy & Precautions, NPO status   History of Anesthesia Complications (+) PONV  Airway Mallampati: II  TM Distance: >3 FB Neck ROM: Full    Dental  (+) Poor Dentition, Loose, Dental Advisory Given,    Pulmonary neg pulmonary ROS,    breath sounds clear to auscultation       Cardiovascular hypertension,  Rhythm:Regular Rate:Normal     Neuro/Psych    GI/Hepatic Neg liver ROS, GERD  ,  Endo/Other  diabetes  Renal/GU negative Renal ROS     Musculoskeletal  (+) Arthritis ,   Abdominal   Peds  Hematology   Anesthesia Other Findings   Reproductive/Obstetrics                         Anesthesia Physical Anesthesia Plan  ASA: III  Anesthesia Plan: General   Post-op Pain Management:    Induction: Intravenous  Airway Management Planned: Oral ETT  Additional Equipment:   Intra-op Plan:   Post-operative Plan:   Informed Consent: I have reviewed the patients History and Physical, chart, labs and discussed the procedure including the risks, benefits and alternatives for the proposed anesthesia with the patient or authorized representative who has indicated his/her understanding and acceptance.   Dental advisory given  Plan Discussed with: CRNA and Anesthesiologist  Anesthesia Plan Comments:        Anesthesia Quick Evaluation

## 2014-12-15 ENCOUNTER — Encounter (HOSPITAL_COMMUNITY): Payer: Self-pay | Admitting: Neurosurgery

## 2014-12-15 DIAGNOSIS — K219 Gastro-esophageal reflux disease without esophagitis: Secondary | ICD-10-CM | POA: Diagnosis not present

## 2014-12-15 DIAGNOSIS — Z853 Personal history of malignant neoplasm of breast: Secondary | ICD-10-CM | POA: Diagnosis not present

## 2014-12-15 DIAGNOSIS — M1990 Unspecified osteoarthritis, unspecified site: Secondary | ICD-10-CM | POA: Diagnosis not present

## 2014-12-15 DIAGNOSIS — M5416 Radiculopathy, lumbar region: Secondary | ICD-10-CM | POA: Diagnosis not present

## 2014-12-15 DIAGNOSIS — M5126 Other intervertebral disc displacement, lumbar region: Secondary | ICD-10-CM | POA: Diagnosis not present

## 2014-12-15 DIAGNOSIS — M48062 Spinal stenosis, lumbar region with neurogenic claudication: Secondary | ICD-10-CM | POA: Diagnosis present

## 2014-12-15 DIAGNOSIS — E119 Type 2 diabetes mellitus without complications: Secondary | ICD-10-CM | POA: Diagnosis not present

## 2014-12-15 DIAGNOSIS — M4806 Spinal stenosis, lumbar region: Secondary | ICD-10-CM | POA: Diagnosis not present

## 2014-12-15 DIAGNOSIS — I10 Essential (primary) hypertension: Secondary | ICD-10-CM | POA: Diagnosis not present

## 2014-12-15 DIAGNOSIS — Z6836 Body mass index (BMI) 36.0-36.9, adult: Secondary | ICD-10-CM | POA: Diagnosis not present

## 2014-12-15 DIAGNOSIS — Z923 Personal history of irradiation: Secondary | ICD-10-CM | POA: Diagnosis not present

## 2014-12-15 DIAGNOSIS — Z7982 Long term (current) use of aspirin: Secondary | ICD-10-CM | POA: Diagnosis not present

## 2014-12-15 DIAGNOSIS — E785 Hyperlipidemia, unspecified: Secondary | ICD-10-CM | POA: Diagnosis not present

## 2014-12-15 LAB — GLUCOSE, CAPILLARY: Glucose-Capillary: 90 mg/dL (ref 65–99)

## 2014-12-15 MED ORDER — CYCLOBENZAPRINE HCL 10 MG PO TABS: 10.0000 mg | ORAL_TABLET | Freq: Three times a day (TID) | ORAL | Status: DC | PRN

## 2014-12-15 MED ORDER — DOCUSATE SODIUM 100 MG PO CAPS
100.0000 mg | ORAL_CAPSULE | Freq: Two times a day (BID) | ORAL | Status: DC
Start: 2014-12-15 — End: 2015-05-24

## 2014-12-15 MED ORDER — OXYCODONE-ACETAMINOPHEN 10-325 MG PO TABS: 1.0000 | ORAL_TABLET | ORAL | Status: DC | PRN

## 2014-12-15 NOTE — Discharge Instructions (Signed)

## 2014-12-15 NOTE — Progress Notes (Signed)
Pt given D/C instructions with Rx's, verbal understanding was provided. Pt's incision is clean and dry with no sign of infection. Pt's IV was removed prior to D/C. Pt D/C'd home via wheelchair @ 1225 per MD order. Pt is stable @ D/C and has no other needs at this time. Holli Humbles, RN

## 2014-12-15 NOTE — Discharge Summary (Signed)
Physician Discharge Summary  Patient ID: Rachel Herman MRN: [id number] DOB/AGE: [birth year] 64 y.o.  Admit date: 12/14/2014 Discharge date: 12/15/2014  Admission Diagnoses: Right L3-4 stenosis, lumbar radiculopathy  Discharge Diagnoses: The same Active Problems:   Spinal stenosis of lumbar region   Lumbar stenosis with neurogenic claudication   Discharged Condition: good  [place] I performed a right L3-4 laminectomy on the patient on 12/14/2014. The surgery went well.  The patient's postoperative course was unremarkable. On postoperative day #1 the patient requested discharge to home. She was given written and oral discharge instructions. All her questions were answered. Consults:None Significant Diagnostic StnoneTreatments:Right L3-4 laminectomye Exam: Blood pressure 100/50, pulse 71, temperature 97.6 F (36.4 C), temperature source Oral, resp. rate 18, height '5\' 7"'  (1.702 m), weight 106.142 kg (234 lb), SpO2 98 %.  the patient is alert and pleasant. She looks well. Her strength is normal.Disposition: Home    Medication List    STOP taking these medications        acetaminophen 500 MG tablet  Commonly known as:  TYLENOL     methocarbamol 500 MG tablet  Commonly known as:  ROBAXIN     oxyCODONE-acetaminophen 5-325 MG tablet  Commonly known as:  PERCOCET  Replaced by:  oxyCODONE-acetaminophen 10-325 MG tablet     traMADol 50 MG tablet  Commonly known as:  ULTRAM      TAKE these medications        ACCU-CHEK FASTCLIX LANCETS Misc  1 Units by Percutaneous route 4 (four) times daily.     accu-chek multiclix lancets  1 each by Other route 3 (three) times daily. ICD 10 E11.9     ACCU-CHEK NANO SMARTVIEW W/DEVICE Kit  1 kit by Subdermal route as directed. Check blood sugars for fasting, and two hours after breakfast, lunch and dinner (4 checks daily)     aspirin 325 MG tablet  Take 325 mg by mouth once.     cyclobenzaprine 10 MG tablet  Commonly known  as:  FLEXERIL  Take 1 tablet (10 mg total) by mouth 3 (three) times daily as needed for muscle spasms.     docusate sodium 100 MG capsule  Commonly known as:  COLACE  Take 1 capsule (100 mg total) by mouth 2 (two) times daily.     exemestane 25 MG tablet  Commonly known as:  AROMASIN  Take 1 tablet (25 mg total) by mouth daily after breakfast.     gabapentin 300 MG capsule  Commonly known as:  NEURONTIN  300 mg during the day, 600 mg at night by mouth     glucose blood test strip  Commonly known as:  ACCU-CHEK SMARTVIEW  1 each by Other route 3 (three) times daily. ICD 10 E11.9     hydrochlorothiazide 25 MG tablet  Commonly known as:  HYDRODIURIL  Take 1 tablet (25 mg total) by mouth daily.     lisinopril 20 MG tablet  Commonly known as:  PRINIVIL,ZESTRIL  Take 1 tablet (20 mg total) by mouth daily.     magnesium hydroxide 400 MG/5ML suspension  Commonly known as:  MILK OF MAGNESIA  Take 30 mLs by mouth as needed for mild constipation.     meclizine 25 MG tablet  Commonly known as:  ANTIVERT  Take 1 tablet (25 mg total) by mouth 3 (three) times daily as needed for dizziness.     metFORMIN 500 MG tablet  Commonly known as:  GLUCOPHAGE  Take 1 tablet (500 mg total)  by mouth 2 (two) times daily.     multivitamin with minerals tablet  Take 1 tablet by mouth daily.     ondansetron 8 MG tablet  Commonly known as:  ZOFRAN  Take 1 tablet (8 mg total) by mouth every 8 (eight) hours as needed for nausea or vomiting.     oxyCODONE-acetaminophen 10-325 MG tablet  Commonly known as:  PERCOCET  Take 1 tablet by mouth every 4 (four) hours as needed for pain.     vitamin C 1000 MG tablet  Take 1,000 mg by mouth daily.     vitamin D (CHOLECALCIFEROL) 400 UNITS tablet  Take 400 Units by mouth daily.         SignedOphelia Charter 12/15/2014, 7:41 AM

## 2015-01-10 ENCOUNTER — Encounter: Payer: Self-pay | Admitting: Hematology

## 2015-01-10 NOTE — Progress Notes (Signed)
Faxed aromasin application to [employer]

## 2015-01-11 ENCOUNTER — Telehealth: Payer: Self-pay | Admitting: Family Medicine

## 2015-01-11 DIAGNOSIS — M48062 Spinal stenosis, lumbar region with neurogenic claudication: Secondary | ICD-10-CM

## 2015-01-11 DIAGNOSIS — M545 Low back pain, unspecified: Secondary | ICD-10-CM

## 2015-01-11 DIAGNOSIS — M79605 Pain in left leg: Principal | ICD-10-CM

## 2015-01-11 DIAGNOSIS — M4726 Other spondylosis with radiculopathy, lumbar region: Secondary | ICD-10-CM

## 2015-01-11 NOTE — Telephone Encounter (Signed)
Patient called and requested a med refill for Tramadol, please f/u with pt.  °

## 2015-01-16 NOTE — Telephone Encounter (Signed)
I do not see tramadol on her med list. She will need to wait for her PCP to return tomorrow to discuss this with her.

## 2015-01-18 MED ORDER — TRAMADOL HCL 50 MG PO TABS
50.0000 mg | ORAL_TABLET | Freq: Three times a day (TID) | ORAL | Status: DC | PRN
Start: 2015-01-18 — End: 2015-02-24

## 2015-01-18 NOTE — Telephone Encounter (Signed)
Pt notified Rx at front office  Pt advised no to mix Tramadol with Percocet  Pt aware, stated percocet make her sleepy and try not to take it

## 2015-01-18 NOTE — Telephone Encounter (Signed)
Tramadol refilled. Patient advised to not mix tramadol with the percocet prescribed by Dr. Arnoldo Morale

## 2015-01-19 ENCOUNTER — Ambulatory Visit (INDEPENDENT_AMBULATORY_CARE_PROVIDER_SITE_OTHER): Payer: Medicare Other | Admitting: Neurology

## 2015-01-19 ENCOUNTER — Encounter: Payer: Self-pay | Admitting: Neurology

## 2015-01-19 VITALS — BP 122/78 | HR 68 | Ht 67.0 in | Wt 233.0 lb

## 2015-01-19 DIAGNOSIS — M48062 Spinal stenosis, lumbar region with neurogenic claudication: Secondary | ICD-10-CM

## 2015-01-19 DIAGNOSIS — M4806 Spinal stenosis, lumbar region: Secondary | ICD-10-CM | POA: Diagnosis not present

## 2015-01-19 DIAGNOSIS — R42 Dizziness and giddiness: Secondary | ICD-10-CM | POA: Diagnosis not present

## 2015-01-19 NOTE — Progress Notes (Signed)
Chief Complaint  Patient presents with  . Lumbar Radiculopathy    Reports still having some pain across her low back. However, it has improved since having her surrgery on 12/14/14 with Dr. Arnoldo Morale.   Chief Complaint  Patient presents with  . Lumbar Radiculopathy    Reports still having some pain across her low back. However, it has improved since having her surrgery on 12/14/14 with Dr. Arnoldo Morale.      PATIENT: Rachel Herman DOB: 1950-02-06  Chief Complaint  Patient presents with  . Lumbar Radiculopathy    Reports still having some pain across her low back. However, it has improved since having her surrgery on 12/14/14 with Dr. Arnoldo Morale.     HISTORICAL  Rachel Herman is a 64 years old right-handed female, seen in refer by  her primary care physician Boykin Nearing, MD for evaluation of dizziness  She has history of left breast cancer, status post left lobectomy followed by radiation, hypertension, diabetes  She presented with dizziness in July 15 2014, at nighttime, she turned and lie down at her right side, noticed acute onset of vertigo, nausea, unsteady gait, she has to be helped by her husband to get up, symptoms gradually improved up she lied down still,  But since the initial event, she has recurrent episode of transient dizziness, especially when she lying to her right side, improved if she lies on her left side, also triggered by sudden positional change. She denied hearing loss, no tinnitus.  She denies significant gait difficulty  She has tried repositional maneuver at home, which has helped her, right side first. We have personally reviewed MRI brain in September 2016 that was normal. This is most consistent with a benign positional vertigo    She complains of over one year history of severe right-sided low back pain, radiating pain to her right hip, right lower extremity, worsening constipation, 10 out of 10 sometimes, she has tried Ibuprofen, could not tolerate  due to significant GI side effect, she been taking frequent Tylenol, tramadol, Neurontin 300 mg 4 tablets daily provides limited help  Electrodiagnostic study today confirmed mild bilateral lumbar radiculopathy, right worse than left,  MRI lumbar in October 2016: Right paracentral disc herniation at L3-L4 superimposed on facet hypertrophy with severe right lateral recess stenosis (descending right L4 nerve root level) and moderate to severe overall spinal stenosis. Mild for age lumbar spine degeneration elsewhere.  She had right L3-4 laminotomy and foraminotomy to decompress the right L4 nerve root using micro-dissection in December 14 2014 by Dr. Arnoldo Morale, her right low back pain has much improved, she is still taking tramadol as needed. She still has mid low back pain. Now she has left hip pain.    REVIEW OF SYSTEMS: Full 14 system review of systems performed and notable only for as above  ALLERGIES: No Known Allergies  HOME MEDICATIONS: Current Outpatient Prescriptions  Medication Sig Dispense Refill  . ACCU-CHEK FASTCLIX LANCETS MISC 1 Units by Percutaneous route 4 (four) times daily. 100 each 12  . Ascorbic Acid (VITAMIN C) 1000 MG tablet Take 1,000 mg by mouth daily.    Marland Kitchen aspirin 325 MG tablet Take 325 mg by mouth once.    . Blood Glucose Monitoring Suppl (ACCU-CHEK NANO SMARTVIEW) W/DEVICE KIT 1 kit by Subdermal route as directed. Check blood sugars for fasting, and two hours after breakfast, lunch and dinner (4 checks daily) 1 kit 0  . cyclobenzaprine (FLEXERIL) 10 MG tablet Take 1 tablet (10 mg total)  by mouth 3 (three) times daily as needed for muscle spasms. 30 tablet 0  . docusate sodium (COLACE) 100 MG capsule Take 1 capsule (100 mg total) by mouth 2 (two) times daily. 60 capsule 0  . exemestane (AROMASIN) 25 MG tablet Take 1 tablet (25 mg total) by mouth daily after breakfast. 30 tablet 3  . gabapentin (NEURONTIN) 300 MG capsule 300 mg during the day, 600 mg at night by mouth 270  capsule 1  . glucose blood (ACCU-CHEK SMARTVIEW) test strip 1 each by Other route 3 (three) times daily. ICD 10 E11.9 100 each 12  . hydrochlorothiazide (HYDRODIURIL) 25 MG tablet Take 1 tablet (25 mg total) by mouth daily. 90 tablet 3  . Lancets (ACCU-CHEK MULTICLIX) lancets 1 each by Other route 3 (three) times daily. ICD 10 E11.9 100 each 12  . lisinopril (PRINIVIL,ZESTRIL) 20 MG tablet Take 1 tablet (20 mg total) by mouth daily. 90 tablet 3  . magnesium hydroxide (MILK OF MAGNESIA) 400 MG/5ML suspension Take 30 mLs by mouth as needed for mild constipation.    . meclizine (ANTIVERT) 25 MG tablet Take 1 tablet (25 mg total) by mouth 3 (three) times daily as needed for dizziness. 60 tablet 2  . metFORMIN (GLUCOPHAGE) 500 MG tablet Take 1 tablet (500 mg total) by mouth 2 (two) times daily. 180 tablet 3  . Multiple Vitamins-Minerals (MULTIVITAMIN WITH MINERALS) tablet Take 1 tablet by mouth daily.    . ondansetron (ZOFRAN) 8 MG tablet Take 1 tablet (8 mg total) by mouth every 8 (eight) hours as needed for nausea or vomiting. 30 tablet 0  . oxyCODONE-acetaminophen (PERCOCET) 10-325 MG tablet Take 1 tablet by mouth every 4 (four) hours as needed for pain. 100 tablet 0  . traMADol (ULTRAM) 50 MG tablet Take 1 tablet (50 mg total) by mouth every 8 (eight) hours as needed. 60 tablet 0  . vitamin D, CHOLECALCIFEROL, 400 UNITS tablet Take 400 Units by mouth daily.     No current facility-administered medications for this visit.    PAST MEDICAL HISTORY: Past Medical History  Diagnosis Date  . Obesity, morbid, BMI 40.0-49.9 ([place])   . Hypertension 2014  . Hyperlipidemia   . PONV (postoperative nausea and vomiting)   . S/P radiation therapy 04/05/14-05/20/14    left breast 60.4Gy totaldose  . Diabetes mellitus ([place]) 09/16/13    Diagnosed on 09/16/13; HgA1C was 7.2/metformin  . Breast cancer ([place]) 12/09/13    left breast Invasive Ductal Carcinoma  . Dizziness   . GERD (gastroesophageal reflux disease)     . Headache     PAST SURGICAL HISTORY: Past Surgical History  Procedure Laterality Date  . Abdominal hysterectomy N/A 09/20/2013    Procedure: HYSTERECTOMY ABDOMINAL;  Surgeon: Osborne Oman, MD;  Location: [facility];  Service: Gynecology;  Laterality: N/A;  . Salpingoophorectomy  09/20/2013    Procedure: SALPINGO OOPHORECTOMY;  Surgeon: Osborne Oman, MD;  Location: [facility];  Service: Gynecology;;  . Laparoscopic assisted vaginal hysterectomy  09/20/2013    Procedure: LAPAROSCOPIC ASSISTED VAGINAL HYSTERECTOMY;  Surgeon: Osborne Oman, MD;  Location: [facility];  Service: Gynecology;;  PT WAS EXAMINED WHILE UP IN STIRRUPS AND IT WAS DECIDED TO OPEN PT DUE TO LARGE MASS  . Breast lumpectomy with axillary lymph node biopsy Left 12/29/13    left breast   . Radioactive seed guided mastectomy with axillary sentinel lymph node biopsy Left 12/29/2013    Procedure: LEFT BREAST RADIOACTIVE SEED LOCALIZED LUMPECTOMY WITH SENTINEL LYMPH  NODE MAPPING;  Surgeon: Erroll Luna, MD;  Location: [facility];  Service: General;  Laterality: Left;  . Re-excision of breast lumpectomy Left 03/02/2014    Procedure: RE-EXCISION OF LEFT BREAST LUMPECTOMY;  Surgeon: Erroll Luna, MD;  Location: [facility];  Service: General;  Laterality: Left;  . Lumbar laminectomy/decompression microdiscectomy Right 12/14/2014    Procedure: Right Lumbar three-four microdiskectomy;  Surgeon: Newman Pies, MD;  Location: [facility];  Service: Neurosurgery;  Laterality: Right;  Right Lumbar three-four microdiskectomy    FAMILY HISTORY: Family History  Problem Relation Age of Onset  . Diabetes Mother   . Multiple myeloma Mother 5  . Hearing loss Mother   . Diabetes Sister   . Diabetes Brother   . Cancer Brother 40    prostate cancer   . Diabetes Son   . Diabetes Maternal Aunt   . Leukemia Maternal Aunt     dx in her 38s  . Alcoholism Brother   . Heart attack Father     SOCIAL  HISTORY:  Social History   Social History  . Marital Status: Married    Spouse Name: Sharia Averitt   . Number of Children: 3  . Years of Education: 12   Occupational History  .  Unemployed   Social History Main Topics  . Smoking status: Never Smoker   . Smokeless tobacco: Never Used  . Alcohol Use: No  . Drug Use: No  . Sexual Activity: Not Currently    Birth Control/ Protection: Post-menopausal   Other Topics Concern  . Not on file   Social History Narrative   Married to [employer] in 1986.    Has 3 children from previous marriage, 1 in [place], 1 in [place], 1 in prison ([place]).    Lives with husband.    Right-handed.   1 cup caffeine per day.        PHYSICAL EXAM   Filed Vitals:   01/19/15 1138  Height: _0  (1.702 m)  Weight: 233 lb (105.688 kg)    Not recorded      Body mass index is 36.48 kg/(m^2).  PHYSICAL EXAMNIATION:  Gen: NAD, conversant, well nourised, obese, well groomed                     Cardiovascular: Regular rate rhythm, no peripheral edema, warm, nontender. Eyes: Conjunctivae clear without exudates or hemorrhage Neck: Supple, no carotid bruise. Pulmonary: Clear to auscultation bilaterally   NEUROLOGICAL EXAM:  MENTAL STATUS: Speech:    Speech is normal; fluent and spontaneous with normal comprehension.  Cognition:     Orientation to time, place and person     Normal recent and remote memory     Normal Attention span and concentration     Normal Language, naming, repeating,spontaneous speech     Fund of knowledge   CRANIAL NERVES: CN II: Visual fields are full to confrontation. Pupils are round equal and briskly reactive to light. CN III, IV, VI: extraocular movement are normal. No ptosis. CN V: Facial sensation is intact to pinprick in all 3 divisions bilaterally. Corneal responses are intact.  CN VII: Face is symmetric with normal eye closure and smile. CN VIII: Hearing is normal to rubbing fingers CN IX, X:  Palate elevates symmetrically. Phonation is normal. CN XI: Head turning and shoulder shrug are intact CN XII: Tongue is midline with normal movements and no atrophy.  MOTOR: There is no pronator drift of out-stretched arms. Muscle  bulk and tone are normal. Muscle strength is normal.  REFLEXES: Reflexes are 2+ and symmetric at the biceps, triceps, knees, and ankles. Plantar responses are flexor.  SENSORY: Intact to light touch, pinprick, position sense, and vibration sense are intact in fingers and toes.  COORDINATION: Rapid alternating movements and fine finger movements are intact. There is no dysmetria on finger-to-nose and heel-knee-shin.    GAIT/STANCE: She needs push up from chair arm to get up from seated position, obese, mildly unsteady, antalgic gait  Repositional maneuver: Right ear dependent position induced downward rotatory nystagmus, habituate quickly,   DIAGNOSTIC DATA (LABS, IMAGING, TESTING) - I reviewed patient records, labs, notes, testing and imaging myself where available.   ASSESSMENT AND PLAN  Rachel Herman is a 64 y.o. female with vascular risk factor of obesity, hypertension hyperlipidemia, diabetes, history of breast cancer, status post left lobectomy, radiation therapy, presenting with acute onset of dizziness since July 15 2014, right ear dependent position induce rotatory downward beating nystagmus,   Benign positional vertigo.  Much improved with  repositional maneuver  Low back pain, radiating pain to bilateral lower extremity  EMG nerve conduction study confirmed mild bilateral lumbar radiculopathy, right worse than left  MRI of lumbar also confirmed moderate to severe spinal stenosis, severe right foraminal stenosis.  Her low back pain has much improved with right L3-4 laminectomy, foraminotomy in Nov 9th by Dr. Arnoldo Morale.  Continue when necessary NSAIDs, pain management,  Left hip pain,   suggestive of left hip pathology, she wants to hold  off x-ray of hip,    Marcial Pacas, M.D. Ph.D.  [facility] [address], [place],  [address] Ph: [phone] Fax: [phone]  CC: Referring Provider

## 2015-02-10 ENCOUNTER — Telehealth: Payer: Self-pay | Admitting: Family Medicine

## 2015-02-10 DIAGNOSIS — I152 Hypertension secondary to endocrine disorders: Secondary | ICD-10-CM

## 2015-02-10 NOTE — Telephone Encounter (Signed)
Pt. Returned call and stated that she does not need a refill on lisinopril, pt. Needs a refill on hydrochlorothiazide (HYDRODIURIL) 25 MG tablet, and oxyCODONE-acetaminophen (PERCOCET) 10-325 MG tablet. Please f/u with pt.

## 2015-02-10 NOTE — Telephone Encounter (Signed)
Pt. Is requesting a med refill on Lisinopril and oxyCODONE-acetaminophen (PERCOCET) 10-325 MG tablet. Please f/u with pt.

## 2015-02-13 ENCOUNTER — Ambulatory Visit: Payer: Medicare Other | Admitting: Family Medicine

## 2015-02-14 ENCOUNTER — Encounter: Payer: Self-pay | Admitting: Hematology

## 2015-02-14 ENCOUNTER — Ambulatory Visit
Admission: RE | Admit: 2015-02-14 | Discharge: 2015-02-14 | Disposition: A | Discharge status: Home / Self Care | Payer: Medicare Other | Source: Ambulatory Visit | Attending: Hematology | Admitting: Hematology

## 2015-02-14 ENCOUNTER — Other Ambulatory Visit: Payer: Self-pay | Admitting: Hematology

## 2015-02-14 DIAGNOSIS — C50212 Malignant neoplasm of upper-inner quadrant of left female breast: Secondary | ICD-10-CM

## 2015-02-14 DIAGNOSIS — R928 Other abnormal and inconclusive findings on diagnostic imaging of breast: Secondary | ICD-10-CM | POA: Diagnosis not present

## 2015-02-14 MED ORDER — HYDROCHLOROTHIAZIDE 25 MG PO TABS: 25.0000 mg | ORAL_TABLET | Freq: Every day | ORAL | Status: DC

## 2015-02-14 NOTE — Progress Notes (Signed)
Per Ebony---call from [employer].Marland Kitchen aromasin approved 02/14/15-02/04/16.

## 2015-02-14 NOTE — Telephone Encounter (Signed)
HCTZ refills 

## 2015-02-14 NOTE — Progress Notes (Signed)
I faxed pfizer form to vicky [id number]. Insurance lady for patient said they said info missing. I advised nothing is missing from any pages.

## 2015-02-15 MED ORDER — LISINOPRIL 20 MG PO TABS: 20.0000 mg | ORAL_TABLET | Freq: Every day | ORAL | Status: DC

## 2015-02-15 NOTE — Telephone Encounter (Signed)
Pt notified Rx HCTZ send to [facility]  Requesting Lisinopril refill, refills send  Pt notified Oxycodone not prescribed by Dr Adrian Blackwater  Pt verbalized understanding

## 2015-02-24 ENCOUNTER — Encounter: Payer: Self-pay | Admitting: Family Medicine

## 2015-02-24 ENCOUNTER — Ambulatory Visit: Payer: Medicare Other | Attending: Family Medicine | Admitting: Family Medicine

## 2015-02-24 VITALS — BP 105/73 | HR 101 | Temp 98.1°F | Resp 16 | Ht 67.0 in | Wt 229.0 lb

## 2015-02-24 DIAGNOSIS — Z7982 Long term (current) use of aspirin: Secondary | ICD-10-CM | POA: Insufficient documentation

## 2015-02-24 DIAGNOSIS — Z79899 Other long term (current) drug therapy: Secondary | ICD-10-CM | POA: Insufficient documentation

## 2015-02-24 DIAGNOSIS — Z23 Encounter for immunization: Secondary | ICD-10-CM | POA: Insufficient documentation

## 2015-02-24 DIAGNOSIS — Z7984 Long term (current) use of oral hypoglycemic drugs: Secondary | ICD-10-CM | POA: Diagnosis not present

## 2015-02-24 DIAGNOSIS — E1165 Type 2 diabetes mellitus with hyperglycemia: Secondary | ICD-10-CM | POA: Insufficient documentation

## 2015-02-24 DIAGNOSIS — E119 Type 2 diabetes mellitus without complications: Secondary | ICD-10-CM

## 2015-02-24 DIAGNOSIS — Z1159 Encounter for screening for other viral diseases: Secondary | ICD-10-CM

## 2015-02-24 DIAGNOSIS — M79605 Pain in left leg: Secondary | ICD-10-CM

## 2015-02-24 DIAGNOSIS — M545 Low back pain, unspecified: Secondary | ICD-10-CM

## 2015-02-24 DIAGNOSIS — G8929 Other chronic pain: Secondary | ICD-10-CM | POA: Insufficient documentation

## 2015-02-24 DIAGNOSIS — Z0000 Encounter for general adult medical examination without abnormal findings: Secondary | ICD-10-CM

## 2015-02-24 LAB — GLUCOSE, POCT (MANUAL RESULT ENTRY): POC Glucose: 109 mg/dl — AB (ref 70–99)

## 2015-02-24 MED ORDER — TRAMADOL HCL 50 MG PO TABS: 50.0000 mg | ORAL_TABLET | Freq: Three times a day (TID) | ORAL | Status: DC | PRN

## 2015-02-24 MED ORDER — ZOSTER VACCINE LIVE 19400 UNT/0.65ML ~~LOC~~ SOLR: 0.6500 mL | Freq: Once | SUBCUTANEOUS | Status: DC

## 2015-02-24 NOTE — Assessment & Plan Note (Signed)
Well controlled diabetes Continue metformin BMP today Foot exam today Opthalmology referral

## 2015-02-24 NOTE — Progress Notes (Signed)
F/U DM Glucose running 65-109 Pain scale #8 back pain No tobacco user  No suicidal thought in the past two weeks

## 2015-02-24 NOTE — Patient Instructions (Addendum)
Rachel Herman was seen today for diabetes.  Diagnoses and all orders for this visit:  Type 2 diabetes mellitus without complication, unspecified long term insulin use status (HCC) -     POCT glucose (manual entry) -     Ambulatory referral to Ophthalmology -     Basic Metabolic Panel  Need for hepatitis C screening test -     Hepatitis C antibody, reflex  Healthcare maintenance -     zoster vaccine live, PF, (ZOSTAVAX) 32355 UNT/0.65ML injection; Inject 19,400 Units into the skin once. -     Ambulatory referral to Gastroenterology  Lumbar pain with radiation down left leg -     traMADol (ULTRAM) 50 MG tablet; Take 1 tablet (50 mg total) by mouth every 8 (eight) hours as needed.    F/u in 3 months for diabetes   Dr. Adrian Blackwater

## 2015-02-24 NOTE — Progress Notes (Signed)
Patient ID: Rachel Herman, female   DOB: 1950/10/09, 65 y.o.   MRN: [id number]   Subjective:  Patient ID: Rachel Herman, female    DOB: 05-Jan-1951  Age: 65 y.o. MRN: [id number]  CC: Diabetes   HPI Rachel Herman presents for    1. CHRONIC DIABETES  Disease Monitoring  Blood Sugar Ranges: 65-159  Polyuria: no   Visual problems: no   Medication Compliance: yes  Medication Side Effects  Hypoglycemia: yes, twice during fasting    [place] Exam: due   Foot Exam: done today     2. Chronic low back pain: she 2 months s/p back surgery on R. She is considering surgery on L. She is taking percocet  Prescribed by her surgeon and tramadol prescribed by me for pain control. She likes to take the tramadol at night. She would like to stay on both. She reports that she is not taking them at the same time.   Social History  Substance Use Topics  . Smoking status: Never Smoker   . Smokeless tobacco: Never Used  . Alcohol Use: No   Past Surgical History  Procedure Laterality Date  . Abdominal hysterectomy N/A 09/20/2013    Procedure: HYSTERECTOMY ABDOMINAL;  Surgeon: Osborne Oman, MD;  Location: [facility];  Service: Gynecology;  Laterality: N/A;  . Salpingoophorectomy  09/20/2013    Procedure: SALPINGO OOPHORECTOMY;  Surgeon: Osborne Oman, MD;  Location: [facility];  Service: Gynecology;;  . Laparoscopic assisted vaginal hysterectomy  09/20/2013    Procedure: LAPAROSCOPIC ASSISTED VAGINAL HYSTERECTOMY;  Surgeon: Osborne Oman, MD;  Location: [facility];  Service: Gynecology;;  PT WAS EXAMINED WHILE UP IN STIRRUPS AND IT WAS DECIDED TO OPEN PT DUE TO LARGE MASS  . Breast lumpectomy with axillary lymph node biopsy Left 12/29/13    left breast   . Radioactive seed guided mastectomy with axillary sentinel lymph node biopsy Left 12/29/2013    Procedure: LEFT BREAST RADIOACTIVE SEED LOCALIZED LUMPECTOMY WITH SENTINEL LYMPH NODE MAPPING;  Surgeon: Erroll Luna,  MD;  Location: [facility];  Service: General;  Laterality: Left;  . Re-excision of breast lumpectomy Left 03/02/2014    Procedure: RE-EXCISION OF LEFT BREAST LUMPECTOMY;  Surgeon: Erroll Luna, MD;  Location: [facility];  Service: General;  Laterality: Left;  . Lumbar laminectomy/decompression microdiscectomy Right 12/14/2014    Procedure: Right Lumbar three-four microdiskectomy;  Surgeon: Newman Pies, MD;  Location: [facility];  Service: Neurosurgery;  Laterality: Right;  Right Lumbar three-four microdiskectomy    Outpatient Prescriptions Prior to Visit  Medication Sig Dispense Refill  . ACCU-CHEK FASTCLIX LANCETS MISC 1 Units by Percutaneous route 4 (four) times daily. 100 each 12  . Ascorbic Acid (VITAMIN C) 1000 MG tablet Take 1,000 mg by mouth daily.    Marland Kitchen aspirin 325 MG tablet Take 325 mg by mouth once.    . Blood Glucose Monitoring Suppl (ACCU-CHEK NANO SMARTVIEW) W/DEVICE KIT 1 kit by Subdermal route as directed. Check blood sugars for fasting, and two hours after breakfast, lunch and dinner (4 checks daily) 1 kit 0  . cyclobenzaprine (FLEXERIL) 10 MG tablet Take 1 tablet (10 mg total) by mouth 3 (three) times daily as needed for muscle spasms. 30 tablet 0  . docusate sodium (COLACE) 100 MG capsule Take 1 capsule (100 mg total) by mouth 2 (two) times daily. 60 capsule 0  . exemestane (AROMASIN) 25 MG tablet Take 1 tablet (25 mg total) by  mouth daily after breakfast. 30 tablet 3  . gabapentin (NEURONTIN) 300 MG capsule 300 mg during the day, 600 mg at night by mouth 270 capsule 1  . glucose blood (ACCU-CHEK SMARTVIEW) test strip 1 each by Other route 3 (three) times daily. ICD 10 E11.9 100 each 12  . hydrochlorothiazide (HYDRODIURIL) 25 MG tablet Take 1 tablet (25 mg total) by mouth daily. Pt needs office visit 90 tablet 2  . Lancets (ACCU-CHEK MULTICLIX) lancets 1 each by Other route 3 (three) times daily. ICD 10 E11.9 100 each 12  . lisinopril  (PRINIVIL,ZESTRIL) 20 MG tablet Take 1 tablet (20 mg total) by mouth daily. 90 tablet 2  . magnesium hydroxide (MILK OF MAGNESIA) 400 MG/5ML suspension Take 30 mLs by mouth as needed for mild constipation.    . meclizine (ANTIVERT) 25 MG tablet Take 1 tablet (25 mg total) by mouth 3 (three) times daily as needed for dizziness. 60 tablet 2  . metFORMIN (GLUCOPHAGE) 500 MG tablet Take 1 tablet (500 mg total) by mouth 2 (two) times daily. 180 tablet 3  . Multiple Vitamins-Minerals (MULTIVITAMIN WITH MINERALS) tablet Take 1 tablet by mouth daily.    . ondansetron (ZOFRAN) 8 MG tablet Take 1 tablet (8 mg total) by mouth every 8 (eight) hours as needed for nausea or vomiting. 30 tablet 0  . oxyCODONE-acetaminophen (PERCOCET) 10-325 MG tablet Take 1 tablet by mouth every 4 (four) hours as needed for pain. 100 tablet 0  . traMADol (ULTRAM) 50 MG tablet Take 1 tablet (50 mg total) by mouth every 8 (eight) hours as needed. 60 tablet 0  . vitamin D, CHOLECALCIFEROL, 400 UNITS tablet Take 400 Units by mouth daily.     No facility-administered medications prior to visit.    ROS Review of Systems  Constitutional: Negative for fever and chills.  Eyes: Negative for visual disturbance.  Respiratory: Negative for shortness of breath.   Cardiovascular: Negative for chest pain.  Gastrointestinal: Negative for abdominal pain and blood in stool.  Musculoskeletal: Positive for myalgias and back pain. Negative for arthralgias.  Skin: Negative for rash.  Allergic/Immunologic: Negative for immunocompromised state.  Neurological: Negative for dizziness.  Hematological: Negative for adenopathy. Does not bruise/bleed easily.  Psychiatric/Behavioral: Negative for suicidal ideas and dysphoric mood.    Objective:  BP 105/73 mmHg  Pulse 101  Temp(Src) 98.1 F (36.7 C) (Oral)  Resp 16  Ht '5\' 7"'  (1.702 m)  Wt 229 lb (103.874 kg)  BMI 35.86 kg/m2  SpO2 98%  BP/Weight 02/24/2015 01/19/2015 97/98/9211  Systolic BP  941 740 814  Diastolic BP 73 78 67  Wt. (Lbs) 229 233 -  BMI 35.86 36.48 -     Physical Exam  Constitutional: She is oriented to person, place, and time. She appears well-developed and well-nourished. No distress.  HENT:  Head: Normocephalic and atraumatic.  Cardiovascular: Normal rate, regular rhythm, normal heart sounds and intact distal pulses.   Pulmonary/Chest: Effort normal and breath sounds normal.  Musculoskeletal: She exhibits no edema.       Back:  Neurological: She is alert and oriented to person, place, and time.  Skin: Skin is warm and dry. No rash noted.  Psychiatric: She has a normal mood and affect.    Lab Results  Component Value Date   HGBA1C 6.6* 12/08/2014   CBG 109  Assessment & Plan:   Problem List Items Addressed This Visit    Lumbar pain with radiation down left leg (Chronic)   Relevant Medications  traMADol (ULTRAM) 50 MG tablet   Diabetes mellitus ([place]) - Primary (Chronic)   Relevant Orders   POCT glucose (manual entry) (Completed)   Ambulatory referral to Ophthalmology   Basic Metabolic Panel    Other Visit Diagnoses    Need for hepatitis C screening test        Relevant Orders    Hepatitis C antibody, reflex    Healthcare maintenance        Relevant Medications    zoster vaccine live, PF, (ZOSTAVAX) 07615 UNT/0.65ML injection    Other Relevant Orders    Ambulatory referral to Gastroenterology       No orders of the defined types were placed in this encounter.    Follow-up: No Follow-up on file.   Boykin Nearing MD

## 2015-02-25 LAB — BASIC METABOLIC PANEL
BUN: 21 mg/dL (ref 7–25)
CO2: 26 mmol/L (ref 20–31)
Calcium: 9.5 mg/dL (ref 8.6–10.4)
Chloride: 101 mmol/L (ref 98–110)
Creat: 0.91 mg/dL (ref 0.50–0.99)
Glucose, Bld: 117 mg/dL — ABNORMAL HIGH (ref 65–99)
Potassium: 4.1 mmol/L (ref 3.5–5.3)
Sodium: 139 mmol/L (ref 135–146)

## 2015-02-25 LAB — HEPATITIS C ANTIBODY: HCV Ab: NEGATIVE

## 2015-03-03 DIAGNOSIS — E119 Type 2 diabetes mellitus without complications: Secondary | ICD-10-CM | POA: Diagnosis not present

## 2015-03-03 DIAGNOSIS — H40013 Open angle with borderline findings, low risk, bilateral: Secondary | ICD-10-CM | POA: Diagnosis not present

## 2015-03-03 DIAGNOSIS — H2513 Age-related nuclear cataract, bilateral: Secondary | ICD-10-CM | POA: Diagnosis not present

## 2015-03-10 ENCOUNTER — Other Ambulatory Visit: Payer: Self-pay | Admitting: *Deleted

## 2015-03-10 DIAGNOSIS — C50212 Malignant neoplasm of upper-inner quadrant of left female breast: Secondary | ICD-10-CM

## 2015-03-13 ENCOUNTER — Ambulatory Visit (HOSPITAL_BASED_OUTPATIENT_CLINIC_OR_DEPARTMENT_OTHER): Payer: Medicare Other | Admitting: Hematology

## 2015-03-13 ENCOUNTER — Encounter: Payer: Self-pay | Admitting: Hematology

## 2015-03-13 ENCOUNTER — Telehealth: Payer: Self-pay | Admitting: Hematology

## 2015-03-13 ENCOUNTER — Other Ambulatory Visit (HOSPITAL_BASED_OUTPATIENT_CLINIC_OR_DEPARTMENT_OTHER): Payer: Medicare Other

## 2015-03-13 VITALS — BP 125/62 | HR 84 | Temp 98.5°F | Resp 18 | Ht 67.0 in | Wt 225.2 lb

## 2015-03-13 DIAGNOSIS — I152 Hypertension secondary to endocrine disorders: Secondary | ICD-10-CM

## 2015-03-13 DIAGNOSIS — C50212 Malignant neoplasm of upper-inner quadrant of left female breast: Secondary | ICD-10-CM

## 2015-03-13 DIAGNOSIS — Z6841 Body Mass Index (BMI) 40.0 and over, adult: Secondary | ICD-10-CM

## 2015-03-13 DIAGNOSIS — E041 Nontoxic single thyroid nodule: Secondary | ICD-10-CM | POA: Diagnosis not present

## 2015-03-13 DIAGNOSIS — Z79811 Long term (current) use of aromatase inhibitors: Secondary | ICD-10-CM

## 2015-03-13 LAB — COMPREHENSIVE METABOLIC PANEL
ALT: 46 U/L (ref 0–55)
AST: 29 U/L (ref 5–34)
Albumin: 3.8 g/dL (ref 3.5–5.0)
Alkaline Phosphatase: 64 U/L (ref 40–150)
Anion Gap: 13 mEq/L — ABNORMAL HIGH (ref 3–11)
BUN: 20.4 mg/dL (ref 7.0–26.0)
CO2: 26 mEq/L (ref 22–29)
Calcium: 9.9 mg/dL (ref 8.4–10.4)
Chloride: 103 mEq/L (ref 98–109)
Creatinine: 1.1 mg/dL (ref 0.6–1.1)
EGFR: 52 mL/min/{1.73_m2} — ABNORMAL LOW (ref >90)
Glucose: 99 mg/dl (ref 70–140)
Potassium: 3.9 mEq/L (ref 3.5–5.1)
Sodium: 143 mEq/L (ref 136–145)
Total Bilirubin: 0.34 mg/dL (ref 0.20–1.20)
Total Protein: 8 g/dL (ref 6.4–8.3)

## 2015-03-13 LAB — CBC WITH DIFFERENTIAL/PLATELET
BASO%: 0.5 % (ref 0.0–2.0)
Basophils Absolute: 0 10*3/uL (ref 0.0–0.1)
EOS%: 1.7 % (ref 0.0–7.0)
Eosinophils Absolute: 0.2 10*3/uL (ref 0.0–0.5)
HCT: 40.7 % (ref 34.8–46.6)
HGB: 13.1 g/dL (ref 11.6–15.9)
LYMPH%: 21.6 % (ref 14.0–49.7)
MCH: 27.5 pg (ref 25.1–34.0)
MCHC: 32.1 g/dL (ref 31.5–36.0)
MCV: 85.7 fL (ref 79.5–101.0)
MONO#: 0.6 10*3/uL (ref 0.1–0.9)
MONO%: 6.2 % (ref 0.0–14.0)
NEUT#: 6.5 10*3/uL (ref 1.5–6.5)
NEUT%: 70 % (ref 38.4–76.8)
Platelets: 300 10*3/uL (ref 145–400)
RBC: 4.75 10*6/uL (ref 3.70–5.45)
RDW: 14.2 % (ref 11.2–14.5)
WBC: 9.3 10*3/uL (ref 3.9–10.3)
lymph#: 2 10*3/uL (ref 0.9–3.3)

## 2015-03-13 NOTE — Progress Notes (Signed)
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 Ultrasound of the left axilla is negative.      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    12/29/2013 Surgery Left lumpectomy with close (<0.1cm) inferior margin   03/02/2014 Surgery Reexcision for close margin, path negative for malignancy.   04/05/2014 - 05/20/2014 Radiation Therapy adjuvant breast radiation    05/27/2014 Imaging Bone density scan: normal    CURRENT THERAPY: Exemestane 64m daily, started on 06/07/2014                                    INTERIM HISTORY: She returns for follow up. She had back surgery in November 2016, has recovered well. Her right leg pain has much improved since surgery.  She now has left leg pain, which she contributes to her back issue and may need another back surgery sometime dyspnea. She was not able to afford at the high copay for exemestane,  And ruled out for  2 months. She finally received generic exemestane and restarted one month ago.  She is tolerating well overall. She has chronic hot flash, which is not related to medication, no other new complaints.  MEDICAL HISTORY:  Past Medical History  Diagnosis Date  . Ob[place], morbid, BMI 40.0-49.9 [place]   . Hypertension 2014  . Hyperlipidemia   . PONV (postoperative nausea and vomiting)   . S/P radiation therapy 04/05/14-05/20/14    left breast 60.4Gy totaldose  . Diabetes mellitus ([place] 09/16/13    Diagnosed on 09/16/13; HgA1C was 7.2/metformin  . [place] 12/09[place]    left breast Invasive Ductal Carcinoma  . Dizziness   . GERD (gastroesophageal reflux disease)   . Headache     SURGICAL HISTORY: Past Surgical History  Procedure Laterality Date  . Abdominal hysterectomy N/A 09/20/2013    Procedure: HYSTERECTOMY ABDOMINAL;  Surgeon: UOsborne Oman MD;  [facility];  Service: Gynecology;  Laterality: N/A;  . Salpingoophorectomy  09/20/2013    Procedure: SALPINGO OOPHORECTOMY;  Surgeon: UOsborne Oman MD;  [facility];  Service: Gynecology;;  . Laparoscopic assisted vaginal hysterectomy  09/20/2013    Procedure: LAPAROSCOPIC ASSISTED VAGINAL HYSTERECTOMY;  Surgeon: UOsborne Oman MD;  [facility];  Service: Gynecology;;  PT WAS EXAMINED WHILE UP IN [place] AND IT WAS DECIDED TO OPEN PT  DUE TO LARGE MASS  . Breast lumpectomy with axillary lymph node biopsy Left 12/29/13    left breast   . Radioactive seed guided mastectomy with axillary sentinel lymph node biopsy Left 12/29/2013    Procedure: LEFT BREAST RADIOACTIVE SEED LOCALIZED LUMPECTOMY WITH SENTINEL LYMPH NODE MAPPING;  Surgeon: Erroll Luna, MD;  Location: [facility];  Service: General;  Laterality: Left;  . Re-excision of breast lumpectomy Left 03/02/2014    Procedure: RE-EXCISION OF LEFT BREAST LUMPECTOMY;  Surgeon: Erroll Luna, MD;  Location: [facility];  Service: General;   Laterality: Left;  . Lumbar laminectomy/decompression microdiscectomy Right 12/14/2014    Procedure: Right Lumbar three-four microdiskectomy;  Surgeon: Newman Pies, MD;  Location: [facility];  Service: Neurosurgery;  Laterality: Right;  Right Lumbar three-four microdiskectomy    SOCIAL HISTORY: Social History   Social History  . Marital Status: Married    Spouse Name: Charlene Detter   . Number of Children: 3  . Years of Education: 12   Occupational History  .  Unemployed   Social History Main Topics  . Smoking status: Never Smoker   . Smokeless tobacco: Never Used  . Alcohol Use: No  . Drug Use: No  . Sexual Activity: Not Currently    Birth Control/ Protection: Post-menopausal   Other Topics Concern  . Not on file   Social History Narrative   Married to [employer] in 1986.    Has 3 children from previous marriage, 1 in [place], 1 in [place], 1 in prison ([place]).    Lives with husband.    Right-handed.   1 cup caffeine per day.       FAMILY HISTORY: Family History  Problem Relation Age of Onset  . Diabetes Mother   . Multiple myeloma Mother 73  . Hearing loss Mother   . Diabetes Sister   . Diabetes Brother   . Cancer Brother 40    prostate cancer   . Diabetes Son   . Diabetes Maternal Aunt   . Leukemia Maternal Aunt     dx in her 18s  . Alcoholism Brother   . Heart attack Father     ALLERGIES:  has No Known Allergies.  MEDICATIONS:  Current Outpatient Prescriptions  Medication Sig Dispense Refill  . ACCU-CHEK FASTCLIX LANCETS MISC 1 Units by Percutaneous route 4 (four) times daily. 100 each 12  . Ascorbic Acid (VITAMIN C) 1000 MG tablet Take 1,000 mg by mouth daily.    Marland Kitchen aspirin 325 MG tablet Take 325 mg by mouth once.    . Blood Glucose Monitoring Suppl (ACCU-CHEK NANO SMARTVIEW) W/DEVICE KIT 1 kit by Subdermal route as directed. Check blood sugars for fasting, and two hours after breakfast, lunch and dinner (4 checks daily) 1 kit 0   . cyclobenzaprine (FLEXERIL) 10 MG tablet Take 1 tablet (10 mg total) by mouth 3 (three) times daily as needed for muscle spasms. 30 tablet 0  . docusate sodium (COLACE) 100 MG capsule Take 1 capsule (100 mg total) by mouth 2 (two) times daily. 60 capsule 0  . exemestane (AROMASIN) 25 MG tablet Take 1 tablet (25 mg total) by mouth daily after breakfast. 30 tablet 3  . gabapentin (NEURONTIN) 300 MG capsule 300 mg during the day, 600 mg at night by mouth 270 capsule 1  . glucose blood (ACCU-CHEK SMARTVIEW) test strip 1 each by Other route 3 (three) times daily. ICD 10 E11.9 100 each 12  . hydrochlorothiazide (HYDRODIURIL) 25 MG  tablet Take 1 tablet (25 mg total) by mouth daily. Pt needs office visit 90 tablet 2  . Lancets (ACCU-CHEK MULTICLIX) lancets 1 each by Other route 3 (three) times daily. ICD 10 E11.9 100 each 12  . lisinopril (PRINIVIL,ZESTRIL) 20 MG tablet Take 1 tablet (20 mg total) by mouth daily. 90 tablet 2  . magnesium hydroxide (MILK OF MAGNESIA) 400 MG/5ML suspension Take 30 mLs by mouth as needed for mild constipation.    . meclizine (ANTIVERT) 25 MG tablet Take 1 tablet (25 mg total) by mouth 3 (three) times daily as needed for dizziness. 60 tablet 2  . metFORMIN (GLUCOPHAGE) 500 MG tablet Take 1 tablet (500 mg total) by mouth 2 (two) times daily. 180 tablet 3  . Multiple Vitamins-Minerals (MULTIVITAMIN WITH MINERALS) tablet Take 1 tablet by mouth daily.    . ondansetron (ZOFRAN) 8 MG tablet Take 1 tablet (8 mg total) by mouth every 8 (eight) hours as needed for nausea or vomiting. 30 tablet 0  . oxyCODONE-acetaminophen (PERCOCET) 10-325 MG tablet Take 1 tablet by mouth every 4 (four) hours as needed for pain. 100 tablet 0  . traMADol (ULTRAM) 50 MG tablet Take 1 tablet (50 mg total) by mouth every 8 (eight) hours as needed. 60 tablet 2  . vitamin D, CHOLECALCIFEROL, 400 UNITS tablet Take 400 Units by mouth daily.    Marland Kitchen zoster vaccine live, PF, (ZOSTAVAX) 63875 UNT/0.65ML injection  Inject 19,400 Units into the skin once. 1 each 0   No current facility-administered medications for this visit.    REVIEW OF SYSTEMS:   Constitutional: Denies fevers, chills or abnormal night sweats, (+) 20 lbs weight loss since 8/15, low appetite  Eyes: (+) blurriness of vision, no double vision or watery eyes Ears, nose, mouth, throat, and face: Denies mucositis or sore throat Respiratory: (+) dyspnea on exertion, Denies cough or wheezes Cardiovascular: Denies palpitation, chest discomfort or lower extremity swelling Gastrointestinal:  Denies nausea, heartburn or change in bowel habits Skin: Denies abnormal skin rashes Lymphatics: Denies new lymphadenopathy or easy bruising Neurological:Denies numbness, tingling or new weaknesses Behavioral/Psych: Mood is stable, no new changes  All other systems were reviewed with the patient and are negative.  PHYSICAL EXAMINATION: ECOG PERFORMANCE STATUS: 1 - Symptomatic but completely ambulatory BP 125/62 mmHg  Pulse 84  Temp(Src) 98.5 F (36.9 C) (Oral)  Resp 18  Ht '5\' 7"'  (1.702 m)  Wt 225 lb 3.2 oz (102.15 kg)  BMI 35.26 kg/m2  SpO2 100%  GENERAL:alert, no distress and comfortable SKIN: skin color, texture, turgor are normal, no rashes or significant lesions EYES: normal, conjunctiva are pink and non-injected, sclera clear OROPHARYNX:no exudate, no erythema and lips, buccal mucosa, and tongue normal  NECK: supple, thyroid normal size, non-tender, without nodularity LYMPH:  no palpable lymphadenopathy in the cervical, axillary or inguinal LUNGS: clear to auscultation and percussion with normal breathing effort HEART: regular rate & rhythm and no murmurs and no lower extremity edema ABDOMEN:abdomen soft, nontender, no rebound pain normal bowel sounds Musculoskeletal:no cyanosis of digits and no clubbing  PSYCH: alert & oriented x 3 with fluent speech NEURO: no focal motor/sensory deficits Breasts: Breast inspection showed them to be  symmetrical with no nipple discharge. Surgical scar at the left upper breast and axillary area are well healed. Palpation of the right breasts and axilla revealed no obvious mass that I could appreciate.   I have reviewed the data as listed LABORATORY DATA:  CBC Latest Ref Rng 03/13/2015 12/08/2014 12/07/2014  WBC  3.9 - 10.3 10e3/uL 9.3 7.8 7.9  Hemoglobin 11.6 - 15.9 g/dL 13.1 13.0 13.0  Hematocrit 34.8 - 46.6 % 40.7 41.0 40.8  Platelets 145 - 400 10e3/uL 300 279 277    CMP Latest Ref Rng 03/13/2015 02/24/2015 12/08/2014  Glucose 70 - 140 mg/dl 99 117(H) 115(H)  BUN 7.0 - 26.0 mg/dL 20.'4 21 20  ' Creatinine 0.6 - 1.1 mg/dL 1.1 0.91 1.12(H)  Sodium 136 - 145 mEq/L 143 139 139  Potassium 3.5 - 5.1 mEq/L 3.9 4.1 4.4  Chloride 98 - 110 mmol/L - 101 101  CO2 22 - 29 mEq/L '26 26 27  ' Calcium 8.4 - 10.4 mg/dL 9.9 9.5 10.0  Total Protein 6.4 - 8.3 g/dL 8.0 - -  Total Bilirubin 0.20 - 1.20 mg/dL 0.34 - -  Alkaline Phos 40 - 150 U/L 64 - -  AST 5 - 34 U/L 29 - -  ALT 0 - 55 U/L 46 - -     PATHOLOGY REPORT Diagnosis 12/29/2013 1. Breast, lumpectomy, left - INVASIVE GRADE II DUCTAL CARCINOMA, SPANNING 1.7 CM IN GREATEST DIMENSION. - SECOND FOCUS OF INVASIVE GRADE I DUCTAL CARCINOMA, SPANNING 0.5 CM IN GREATEST DIMENSION. - INTERMEDIATE GRADE DUCTAL CARCINOMA IN SITU ASSOCIATED WITH BOTH FOCI OF TUMOR. - INVASIVE DUCTAL CARCINOMA IS EXTREMELY CLOSE (LESS THAN 0.1 CM) TO INFERIOR MARGIN. - DUCTAL CARCINOMA IN SITU IS CLOSE (0.1 CM) TO INFERIOR MARGIN. - OTHER MARGINS NEGATIVE. - SEE ONCOLOGY TEMPLATE. 2. Lymph node, sentinel, biopsy, Left axillary 1 of 4 FINAL for CARTER, KAMAN ([id number]) Diagnosis(continued) - ONE BENIGN LYMPH NODE WITH NO TUMOR SEEN (0/1). 3. Lymph node, sentinel, biopsy, Left axilla - ONE BENIGN LYMPH NODE WITH NO TUMOR SEEN (0/1). Microscopic Comment 1. BREAST, INVASIVE TUMOR, WITH LYMPH NODES PRESENT Specimen, including laterality and lymph node sampling  (sentinel, non-sentinel): Left partial breast with left sentinel lymph node sampling. Procedure: Left breast lumpectomy with sentinel lymph node biopsies. Histologic type: Invasive ductal carcinoma. Larger focus: Grade: II. Tubule formation: 2. Nuclear pleomorphism: 2. Mitotic: 3. Smaller focus (near inferior margin): Grade: I. Tubule formation: 1. Nuclear pleomorphism: 2. Mitotic: 1. Tumor sizes (gross and glass slide measurement): 1.7 cm and 0.5 cm. Margins: Invasive, distance to closest margin: less than 0.1 cm (inferior margin). In-situ, distance to closest margin: 0.1 cm (inferior margin). Lymphovascular invasion: Definitive lymphovascular invasion is not identified. Ductal carcinoma in situ: Yes. Grade: Intermediate grade. Extensive intraductal component: There is an extensive intraductal component of ductal carcinoma in situ on the smaller focus but not on the larger focus. Lobular neoplasia: No. Tumor focality: Two foci, multifocal. Treatment effect: N/A. Extent of tumor: Tumor confined to breast parenchyma. Skin: Not received. Nipple: Not received. Skeletal muscle: Not received. Lymph nodes: Examined: 2 Sentinel. 0 Non-sentinel. 2 Total. Lymph nodes with metastasis: 0. Breast prognostic profile: Performed on previous case ([id number]). Estrogen receptor: 100%, positive. Progesterone receptor: 96%, positive. Her 2 neu by CISH: 0.81 ratio, no amplification. Ki-67: 73%. Non-neoplastic breast: Fat necrosis. TNM: pT1c(m), pN0, MX. Comments: A smooth muscle myosin, calponin, and p63 immunohistochemical stain are performed on a single block, which helps to confirm the presence of a second focus of invasive ductal carcinoma associated 2 of 4 FINAL for SHAVAWN, STOBAUGH ([id number]) Microscopic Comment(continued) with ductal carcinoma in situ. As Her-2 neu by CISH was negative on the initial biopsy, this will be repeated on the larger tumor focus and reported in  an addendum to follow. A breast prognostic profile will not be performed on the  smaller tumor focus unless otherwise requested (the tumor foci although different grades show morphologically similar nuclear features). (RH:ds 12/31/13)  Oncotype DX Score: 23 (Intermediate risk) (ER+/PR+/HER2-)  RADIOGRAPHIC STUDIES: I have personally reviewed the radiological images as listed and agreed with the findings in the report.  Mammogram 02/14/2015 IMPRESSION: No findings worrisome for recurrent tumor or developing malignancy.  RECOMMENDATION: Bilateral diagnostic mammography in 1 year.  ASSESSMENT & PLAN:  65 year old Caucasian female, with past medical history of hypertension, diabetes, dyslipidemia, obesity who was found to have a left breast mass by screening mammogram.  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 Grade 2, Ki-67 73%, status post lumpectomy with close margin. -She is status post complete surgical resection with lumpectomy. -Her breast cancer is likely cured by surgery alone, but does has some risk of disease recurrence in the future. -I discussed the Oncotype DX result with her and her husband. The recurrence score is 23, with the predicted 10 year recurrence risk of 15% with tamoxifen alone. The benefit of chemotherapy is uncertain in this intermediate risk group. I do not strongly recommend adjuvant chemotherapy. Patient agree with not having chemotherapy.  -Due to the strong positivity of ER and PR, I recommend adjuvant hormonal therapy to reduce the risk of cancer recurrence in the future, which includes local and distant recurrence. Given her post menopause status, I recommend aromatase inhibitor -She has been tolerating aromasin well, will continue for a total of 5 years  - she is clinically doing well, lab results reviewed with her which were unremarkable. Last mammogram one month ago was normal. Physical exam was  also unremarkable. No evidence of recurrence - she will continue  surveillance  #2 Bone health -Her recent bone density scan was normal in 05/2014 -We discussed Aromasin may weak her bone, she will continue calcium and vitamin D.  #3. hypertension, diabetes, dyslipidemia, chronic low back pain -She will continue follow-up with her primary care physician. -she has recovered well from her back surgery , although she may need another one sometime this year  #4 Morbid obesity -We discussed healthy diet and regular exercise. She is willing to try after she recovers well from her back surgery  #5 new right lung nodule -She had a CT abdomen and pelvis in June 2016, which incidentally found a 4 mm nodule in the right lower lobe. I personally reviewed the image.  -She never smoked, risk of lung cancer is low  -her repeated CT chest from 12/09/2014, which showed stable small lung nodules, likely benign. -We'll continue monitoring, consider repeating CT scan in 6-12 months.  #6. Thyroid nodule -Her CT scan revealed a 1.7 cm right thyroid nodule. - I recommend her to get a ultrasound to evaluate her thyroid nodule, she agrees  PLAN: -continue exemastane - thyroid ultrasound within the next months -Return to clinic in 3 months with lab   All questions were answered. The patient knows to call the clinic with any problems, questions or concerns. I spent 20 minutes counseling the patient face to face. The total time spent in the appointment was 25 minutes and more than 50% was on counseling.     Truitt Merle, MD 03/13/2015 1:35 PM

## 2015-03-13 NOTE — Telephone Encounter (Signed)
Pt confirmed labs/ov per 02/06 POF, gave pt AVS and Calendar... KJ °

## 2015-03-15 ENCOUNTER — Telehealth: Payer: Self-pay | Admitting: *Deleted

## 2015-03-15 NOTE — Telephone Encounter (Signed)
Date of birth verified by pt  Lab results given Pt verbalized understanding 

## 2015-03-15 NOTE — Telephone Encounter (Signed)
error 

## 2015-03-15 NOTE — Telephone Encounter (Signed)
-----   Message from Boykin Nearing, MD sent at 02/25/2015  5:32 PM EST ----- Screening hep C negative  BMP normal

## 2015-03-16 DIAGNOSIS — E119 Type 2 diabetes mellitus without complications: Secondary | ICD-10-CM | POA: Diagnosis not present

## 2015-03-16 DIAGNOSIS — H40013 Open angle with borderline findings, low risk, bilateral: Secondary | ICD-10-CM | POA: Diagnosis not present

## 2015-03-16 DIAGNOSIS — H2513 Age-related nuclear cataract, bilateral: Secondary | ICD-10-CM | POA: Diagnosis not present

## 2015-03-20 DIAGNOSIS — K219 Gastro-esophageal reflux disease without esophagitis: Secondary | ICD-10-CM | POA: Diagnosis not present

## 2015-03-20 DIAGNOSIS — K5900 Constipation, unspecified: Secondary | ICD-10-CM | POA: Diagnosis not present

## 2015-03-20 DIAGNOSIS — E119 Type 2 diabetes mellitus without complications: Secondary | ICD-10-CM | POA: Diagnosis not present

## 2015-03-20 DIAGNOSIS — Z1211 Encounter for screening for malignant neoplasm of colon: Secondary | ICD-10-CM | POA: Diagnosis not present

## 2015-03-20 DIAGNOSIS — I10 Essential (primary) hypertension: Secondary | ICD-10-CM | POA: Diagnosis not present

## 2015-03-29 ENCOUNTER — Telehealth: Payer: Self-pay | Admitting: *Deleted

## 2015-03-29 NOTE — Telephone Encounter (Signed)
Received fax from [employer] stating that aromasin tabs 25 mg quantity:  30 has been shipped to pts home on 03/23/15.

## 2015-03-30 DIAGNOSIS — Z1211 Encounter for screening for malignant neoplasm of colon: Secondary | ICD-10-CM | POA: Diagnosis not present

## 2015-03-30 DIAGNOSIS — D122 Benign neoplasm of ascending colon: Secondary | ICD-10-CM | POA: Diagnosis not present

## 2015-03-30 DIAGNOSIS — K635 Polyp of colon: Secondary | ICD-10-CM | POA: Diagnosis not present

## 2015-03-30 DIAGNOSIS — K5730 Diverticulosis of large intestine without perforation or abscess without bleeding: Secondary | ICD-10-CM | POA: Diagnosis not present

## 2015-03-30 DIAGNOSIS — D120 Benign neoplasm of cecum: Secondary | ICD-10-CM | POA: Diagnosis not present

## 2015-03-30 DIAGNOSIS — D125 Benign neoplasm of sigmoid colon: Secondary | ICD-10-CM | POA: Diagnosis not present

## 2015-03-30 DIAGNOSIS — D123 Benign neoplasm of transverse colon: Secondary | ICD-10-CM | POA: Diagnosis not present

## 2015-04-03 ENCOUNTER — Ambulatory Visit (HOSPITAL_COMMUNITY)
Admission: RE | Admit: 2015-04-03 | Discharge: 2015-04-03 | Disposition: A | Discharge status: Home / Self Care | Payer: Medicare Other | Source: Ambulatory Visit | Attending: Hematology | Admitting: Hematology

## 2015-04-03 DIAGNOSIS — E041 Nontoxic single thyroid nodule: Secondary | ICD-10-CM | POA: Diagnosis present

## 2015-04-03 DIAGNOSIS — E042 Nontoxic multinodular goiter: Secondary | ICD-10-CM | POA: Diagnosis not present

## 2015-04-07 DIAGNOSIS — M5416 Radiculopathy, lumbar region: Secondary | ICD-10-CM | POA: Diagnosis not present

## 2015-04-07 DIAGNOSIS — M545 Low back pain: Secondary | ICD-10-CM | POA: Diagnosis not present

## 2015-05-01 ENCOUNTER — Ambulatory Visit: Payer: Medicare Other | Admitting: Neurology

## 2015-05-04 ENCOUNTER — Other Ambulatory Visit: Payer: Self-pay | Admitting: Hematology

## 2015-05-04 ENCOUNTER — Telehealth: Payer: Self-pay | Admitting: Hematology

## 2015-05-04 ENCOUNTER — Other Ambulatory Visit: Payer: Self-pay | Admitting: *Deleted

## 2015-05-04 DIAGNOSIS — E042 Nontoxic multinodular goiter: Secondary | ICD-10-CM

## 2015-05-04 DIAGNOSIS — C50212 Malignant neoplasm of upper-inner quadrant of left female breast: Secondary | ICD-10-CM

## 2015-05-04 NOTE — Telephone Encounter (Signed)
cld & spoke to pt and adv to call [facility] to sch US Biopsy adv screening questions req by pt to sch-gave [phone]

## 2015-05-04 NOTE — Telephone Encounter (Signed)
I called the patient and discussed her ultrasound of the thyroid findings. She has multiple thyroid nodules, largest 2.1 cm. Radiologist recommend biopsy. I discussed with her, she is agreeable. I'll refer her to IR for thyroid nodule biopsy.  Rachel Herman  05/04/2015

## 2015-05-09 ENCOUNTER — Other Ambulatory Visit: Payer: Medicare Other

## 2015-05-09 ENCOUNTER — Ambulatory Visit
Admission: RE | Admit: 2015-05-09 | Discharge: 2015-05-09 | Disposition: A | Discharge status: Home / Self Care | Payer: Medicare Other | Source: Ambulatory Visit | Attending: Hematology | Admitting: Hematology

## 2015-05-09 ENCOUNTER — Other Ambulatory Visit (HOSPITAL_COMMUNITY)
Admission: RE | Admit: 2015-05-09 | Discharge: 2015-05-09 | Disposition: A | Discharge status: Home / Self Care | Payer: Medicare Other | Source: Ambulatory Visit | Attending: Radiology | Admitting: Radiology

## 2015-05-09 DIAGNOSIS — E042 Nontoxic multinodular goiter: Secondary | ICD-10-CM | POA: Insufficient documentation

## 2015-05-09 DIAGNOSIS — C50212 Malignant neoplasm of upper-inner quadrant of left female breast: Secondary | ICD-10-CM

## 2015-05-09 DIAGNOSIS — E041 Nontoxic single thyroid nodule: Secondary | ICD-10-CM | POA: Diagnosis not present

## 2015-05-11 ENCOUNTER — Telehealth: Payer: Self-pay | Admitting: Hematology

## 2015-05-11 NOTE — Telephone Encounter (Signed)
I called pt and discussed her thyroid biopsy result, which showed benign follicular nodule, no malignancy. She voiced good understanding and was happy with result.  Truitt Merle  05/11/2015

## 2015-05-22 ENCOUNTER — Ambulatory Visit: Payer: Medicare Other | Admitting: Family Medicine

## 2015-05-24 ENCOUNTER — Ambulatory Visit: Payer: Medicare Other | Attending: Family Medicine | Admitting: Family Medicine

## 2015-05-24 ENCOUNTER — Encounter: Payer: Self-pay | Admitting: Family Medicine

## 2015-05-24 VITALS — BP 129/84 | HR 78 | Temp 98.4°F | Resp 16 | Ht 67.0 in | Wt 228.0 lb

## 2015-05-24 DIAGNOSIS — Z79899 Other long term (current) drug therapy: Secondary | ICD-10-CM | POA: Insufficient documentation

## 2015-05-24 DIAGNOSIS — Z7982 Long term (current) use of aspirin: Secondary | ICD-10-CM | POA: Insufficient documentation

## 2015-05-24 DIAGNOSIS — Z7984 Long term (current) use of oral hypoglycemic drugs: Secondary | ICD-10-CM | POA: Diagnosis not present

## 2015-05-24 DIAGNOSIS — E119 Type 2 diabetes mellitus without complications: Secondary | ICD-10-CM | POA: Diagnosis not present

## 2015-05-24 DIAGNOSIS — M4726 Other spondylosis with radiculopathy, lumbar region: Secondary | ICD-10-CM | POA: Diagnosis not present

## 2015-05-24 DIAGNOSIS — M545 Low back pain, unspecified: Secondary | ICD-10-CM

## 2015-05-24 DIAGNOSIS — M542 Cervicalgia: Secondary | ICD-10-CM | POA: Insufficient documentation

## 2015-05-24 DIAGNOSIS — M79605 Pain in left leg: Secondary | ICD-10-CM

## 2015-05-24 LAB — POCT GLYCOSYLATED HEMOGLOBIN (HGB A1C): Hemoglobin A1C: 5.8

## 2015-05-24 LAB — GLUCOSE, POCT (MANUAL RESULT ENTRY): POC Glucose: 83 mg/dl (ref 70–99)

## 2015-05-24 MED ORDER — TRAMADOL HCL 50 MG PO TABS: 50.0000 mg | ORAL_TABLET | Freq: Three times a day (TID) | ORAL | Status: DC | PRN

## 2015-05-24 MED ORDER — METHYLPREDNISOLONE 4 MG PO TBPK: ORAL_TABLET | ORAL | Status: DC

## 2015-05-24 MED ORDER — SENNOSIDES-DOCUSATE SODIUM 8.6-50 MG PO TABS: 2.0000 | ORAL_TABLET | Freq: Every evening | ORAL | Status: DC | PRN

## 2015-05-24 NOTE — Progress Notes (Signed)
F/U DM Glucose running 70-100 Taking medication as prescribed  Pain scale #10 back and leg pain  No suicidal thoughts in the past two  Weeks  No tobacco user

## 2015-05-24 NOTE — Patient Instructions (Signed)
Viktoria was seen today for diabetes.  Diagnoses and all orders for this visit:  Type 2 diabetes mellitus without complication, unspecified long term insulin use status (HCC) -     POCT glycosylated hemoglobin (Hb A1C) -     POCT glucose (manual entry)  Osteoarthritis of spine with radiculopathy, lumbar region -     methylPREDNISolone (MEDROL DOSEPAK) 4 MG TBPK tablet; Taper per packet insert -     traMADol (ULTRAM) 50 MG tablet; Take 1 tablet (50 mg total) by mouth every 8 (eight) hours as needed. -     senna-docusate (SENOKOT-S) 8.6-50 MG tablet; Take 2 tablets by mouth at bedtime as needed for mild constipation.  Lumbar pain with radiation down left leg -     methylPREDNISolone (MEDROL DOSEPAK) 4 MG TBPK tablet; Taper per packet insert -     traMADol (ULTRAM) 50 MG tablet; Take 1 tablet (50 mg total) by mouth every 8 (eight) hours as needed. -     senna-docusate (SENOKOT-S) 8.6-50 MG tablet; Take 2 tablets by mouth at bedtime as needed for mild constipation.  Neck pain on right side -     methylPREDNISolone (MEDROL DOSEPAK) 4 MG TBPK tablet; Taper per packet insert -     DG Cervical Spine 2 or 3 views; Future -     traMADol (ULTRAM) 50 MG tablet; Take 1 tablet (50 mg total) by mouth every 8 (eight) hours as needed. -     senna-docusate (SENOKOT-S) 8.6-50 MG tablet; Take 2 tablets by mouth at bedtime as needed for mild constipation.    F/u in 3 months for diabetes and HTN  Dr. Adrian Blackwater

## 2015-05-24 NOTE — Progress Notes (Signed)
Subjective:  Patient ID: Rachel Herman, female    DOB: 06-06-1950  Age: 65 y.o. MRN: [id number]  CC: Diabetes   HPI Rachel Herman presents for   1. CHRONIC DIABETES  Disease Monitoring  Blood Sugar Ranges: 70-100  Polyuria: no   Visual problems: no   Medication Compliance: yes  Medication Side Effects  Hypoglycemia: no   Preventitive Health Care  Eye Exam: done  Foot Exam: done recently   Diet pattern: low carb   Exercise: minimal    2. L sided back pain: x one month. Had a slip and fall 2 weeks ago. She fell on her bottom.  Also with R side neck pain down to shoulder in R arm 1 week ago. No injury to neck or R arm. She is taking tramadol 50 mg TID and aleve for pain control. She has known lumbar DJD s/p lumbar laminectomy/decompression microdiskectomy in 12/2014. She denies fecal and urinary incontinence.   Past Surgical History  Procedure Laterality Date  . Abdominal hysterectomy N/A 09/20/2013    Procedure: HYSTERECTOMY ABDOMINAL;  Surgeon: Osborne Oman, MD;  Location: [facility];  Service: Gynecology;  Laterality: N/A;  . Salpingoophorectomy  09/20/2013    Procedure: SALPINGO OOPHORECTOMY;  Surgeon: Osborne Oman, MD;  Location: [facility];  Service: Gynecology;;  . Laparoscopic assisted vaginal hysterectomy  09/20/2013    Procedure: LAPAROSCOPIC ASSISTED VAGINAL HYSTERECTOMY;  Surgeon: Osborne Oman, MD;  Location: [facility];  Service: Gynecology;;  PT WAS EXAMINED WHILE UP IN [place] AND IT WAS DECIDED TO OPEN PT DUE TO LARGE MASS  . Breast lumpectomy with axillary lymph node biopsy Left 12/29/13    left breast   . Radioactive seed guided mastectomy with axillary sentinel lymph node biopsy Left 12/29/2013    Procedure: LEFT BREAST RADIOACTIVE SEED LOCALIZED LUMPECTOMY WITH SENTINEL LYMPH NODE MAPPING;  Surgeon: Erroll Luna, MD;  Location: [facility];  Service: General;  Laterality: Left;  . Re-excision of breast lumpectomy Left 03/02/2014   Procedure: RE-EXCISION OF LEFT BREAST LUMPECTOMY;  Surgeon: Erroll Luna, MD;  Location: [place];  Service: General;  Laterality: Left;  . Lumbar laminectomy/decompression microdiscectomy Right 12/14/2014    Procedure: Right Lumbar three-four microdiskectomy;  Surgeon: Newman Pies, MD;  Location: [facility];  Service: Neurosurgery;  Laterality: Right;  Right Lumbar three-four microdiskectomy    Social History  Substance Use Topics  . Smoking status: Never Smoker   . Smokeless tobacco: Never Used  . Alcohol Use: No    Outpatient Prescriptions Prior to Visit  Medication Sig Dispense Refill  . ACCU-CHEK FASTCLIX LANCETS MISC 1 Units by Percutaneous route 4 (four) times daily. 100 each 12  . Ascorbic Acid (VITAMIN C) 1000 MG tablet Take 1,000 mg by mouth daily.    Marland Kitchen aspirin 325 MG tablet Take 325 mg by mouth once.    . Blood Glucose Monitoring Suppl (ACCU-CHEK NANO SMARTVIEW) W/DEVICE KIT 1 kit by Subdermal route as directed. Check blood sugars for fasting, and two hours after breakfast, lunch and dinner (4 checks daily) 1 kit 0  . cyclobenzaprine (FLEXERIL) 10 MG tablet Take 1 tablet (10 mg total) by mouth 3 (three) times daily as needed for muscle spasms. 30 tablet 0  . docusate sodium (COLACE) 100 MG capsule Take 1 capsule (100 mg total) by mouth 2 (two) times daily. 60 capsule 0  . exemestane (AROMASIN) 25 MG tablet Take 1 tablet (25 mg total) by mouth daily after breakfast. 30  tablet 3  . gabapentin (NEURONTIN) 300 MG capsule 300 mg during the day, 600 mg at night by mouth 270 capsule 1  . glucose blood (ACCU-CHEK SMARTVIEW) test strip 1 each by Other route 3 (three) times daily. ICD 10 E11.9 100 each 12  . hydrochlorothiazide (HYDRODIURIL) 25 MG tablet Take 1 tablet (25 mg total) by mouth daily. Pt needs office visit 90 tablet 2  . Lancets (ACCU-CHEK MULTICLIX) lancets 1 each by Other route 3 (three) times daily. ICD 10 E11.9 100 each 12  . lisinopril  (PRINIVIL,ZESTRIL) 20 MG tablet Take 1 tablet (20 mg total) by mouth daily. 90 tablet 2  . magnesium hydroxide (MILK OF MAGNESIA) 400 MG/5ML suspension Take 30 mLs by mouth as needed for mild constipation.    . metFORMIN (GLUCOPHAGE) 500 MG tablet Take 1 tablet (500 mg total) by mouth 2 (two) times daily. 180 tablet 3  . Multiple Vitamins-Minerals (MULTIVITAMIN WITH MINERALS) tablet Take 1 tablet by mouth daily. Reported on 03/13/2015    . ondansetron (ZOFRAN) 8 MG tablet Take 1 tablet (8 mg total) by mouth every 8 (eight) hours as needed for nausea or vomiting. 30 tablet 0  . traMADol (ULTRAM) 50 MG tablet Take 1 tablet (50 mg total) by mouth every 8 (eight) hours as needed. 60 tablet 2  . vitamin D, CHOLECALCIFEROL, 400 UNITS tablet Take 400 Units by mouth daily.    Marland Kitchen zoster vaccine live, PF, (ZOSTAVAX) 61443 UNT/0.65ML injection Inject 19,400 Units into the skin once. (Patient not taking: Reported on 03/13/2015) 1 each 0  . oxyCODONE-acetaminophen (PERCOCET) 10-325 MG tablet Take 1 tablet by mouth every 4 (four) hours as needed for pain. (Patient not taking: Reported on 03/13/2015) 100 tablet 0   No facility-administered medications prior to visit.    ROS Review of Systems  Constitutional: Negative for fever and chills.  Eyes: Negative for visual disturbance.  Respiratory: Negative for shortness of breath.   Cardiovascular: Negative for chest pain.  Gastrointestinal: Negative for abdominal pain and blood in stool.  Musculoskeletal: Positive for myalgias, back pain, arthralgias and neck pain.  Skin: Negative for rash.  Allergic/Immunologic: Negative for immunocompromised state.  Neurological: Negative for dizziness.  Hematological: Negative for adenopathy. Does not bruise/bleed easily.  Psychiatric/Behavioral: Negative for suicidal ideas and dysphoric mood.    Objective:  BP 129/84 mmHg  Pulse 78  Temp(Src) 98.4 F (36.9 C) (Oral)  Resp 16  Ht '5\' 7"'  (1.702 m)  Wt 228 lb (103.42 kg)   BMI 35.70 kg/m2  SpO2 100%  BP/Weight 05/24/2015 03/13/2015 1/54/0086  Systolic BP 761 950 932  Diastolic BP 84 62 73  Wt. (Lbs) 228 225.2 229  BMI 35.7 35.26 35.86   Physical Exam  Constitutional: She is oriented to person, place, and time. She appears well-developed and well-nourished. No distress.  HENT:  Head: Normocephalic and atraumatic.  Neck: Muscular tenderness present. No spinous process tenderness present. No rigidity. Decreased range of motion present. No edema and no erythema present.    Cardiovascular: Normal rate, regular rhythm, normal heart sounds and intact distal pulses.   Pulmonary/Chest: Effort normal and breath sounds normal.  Musculoskeletal: She exhibits no edema.       Back:  Back Exam: Back: Normal Curvature, no deformities or CVA tenderness  Paraspinal Tenderness: L lumbar   LE Strength 5/5  LE Sensation: in tact  LE Reflexes 2+ and symmetric  Straight leg raise: negative    Neurological: She is alert and oriented to person, place, and  time.  Skin: Skin is warm and dry. No rash noted.  Psychiatric: She has a normal mood and affect.    Lab Results  Component Value Date   HGBA1C 5.8 05/24/2015   CBG 83 Assessment & Plan:   Rachel Herman was seen today for diabetes.  Diagnoses and all orders for this visit:  Type 2 diabetes mellitus without complication, unspecified long term insulin use status (HCC) -     POCT glycosylated hemoglobin (Hb A1C) -     POCT glucose (manual entry)  Osteoarthritis of spine with radiculopathy, lumbar region -     methylPREDNISolone (MEDROL DOSEPAK) 4 MG TBPK tablet; Taper per packet insert -     traMADol (ULTRAM) 50 MG tablet; Take 1 tablet (50 mg total) by mouth every 8 (eight) hours as needed. -     senna-docusate (SENOKOT-S) 8.6-50 MG tablet; Take 2 tablets by mouth at bedtime as needed for mild constipation.  Lumbar pain with radiation down left leg -     methylPREDNISolone (MEDROL DOSEPAK) 4 MG TBPK tablet; Taper per  packet insert -     traMADol (ULTRAM) 50 MG tablet; Take 1 tablet (50 mg total) by mouth every 8 (eight) hours as needed. -     senna-docusate (SENOKOT-S) 8.6-50 MG tablet; Take 2 tablets by mouth at bedtime as needed for mild constipation.  Neck pain on right side -     methylPREDNISolone (MEDROL DOSEPAK) 4 MG TBPK tablet; Taper per packet insert -     DG Cervical Spine 2 or 3 views; Future -     traMADol (ULTRAM) 50 MG tablet; Take 1 tablet (50 mg total) by mouth every 8 (eight) hours as needed. -     senna-docusate (SENOKOT-S) 8.6-50 MG tablet; Take 2 tablets by mouth at bedtime as needed for mild constipation.    No orders of the defined types were placed in this encounter.    Follow-up: No Follow-up on file.   Boykin Nearing MD

## 2015-05-24 NOTE — Assessment & Plan Note (Signed)
Lumbar back pain with known DJD and spinal stenosis, flare up of chronic pain without red flags  Plan for steroid dose pak Tramadol

## 2015-05-24 NOTE — Assessment & Plan Note (Signed)
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

## 2015-05-24 NOTE — Assessment & Plan Note (Signed)
Well-controlled.  Continue current regimen. 

## 2015-05-26 ENCOUNTER — Encounter: Payer: Self-pay | Admitting: Clinical

## 2015-05-26 NOTE — Progress Notes (Signed)
Depression screen [facility] 2/9 05/24/2015 02/24/2015 09/08/2014 07/28/2014 07/12/2014  Decreased Interest 0 0 0 0 0  Down, Depressed, Hopeless 0 0 0 0 0  PHQ - 2 Score 0 0 0 0 0  Altered sleeping 0 1 - - -  Tired, decreased energy 2 1 - - -  Change in appetite 1 0 - - -  Feeling bad or failure about yourself  0 0 - - -  Trouble concentrating 0 0 - - -  Moving slowly or fidgety/restless 0 0 - - -  Suicidal thoughts 0 0 - - -  PHQ-9 Score 3 2 - - -   GAD 7 : Generalized Anxiety Score 05/24/2015 02/24/2015  Nervous, Anxious, on Edge 0 1  Control/stop worrying 0 0  Worry too much - different things 1 1  Trouble relaxing 1 1  Restless 0 0  Easily annoyed or irritable 0 0  Afraid - awful might happen 0 0  Total GAD 7 Score 2 3

## 2015-05-31 ENCOUNTER — Telehealth: Payer: Self-pay | Admitting: Hematology

## 2015-05-31 NOTE — Telephone Encounter (Signed)
cld & spoke to pt to adv of r/s appt on 5/5 @12 :45-pt understood

## 2015-06-09 ENCOUNTER — Other Ambulatory Visit: Payer: Medicare Other

## 2015-06-09 ENCOUNTER — Ambulatory Visit (HOSPITAL_BASED_OUTPATIENT_CLINIC_OR_DEPARTMENT_OTHER): Payer: Medicare Other | Admitting: Hematology

## 2015-06-09 ENCOUNTER — Telehealth: Payer: Self-pay | Admitting: Hematology

## 2015-06-09 ENCOUNTER — Other Ambulatory Visit (HOSPITAL_BASED_OUTPATIENT_CLINIC_OR_DEPARTMENT_OTHER): Payer: Medicare Other

## 2015-06-09 ENCOUNTER — Ambulatory Visit: Payer: Medicare Other | Admitting: Hematology

## 2015-06-09 ENCOUNTER — Encounter: Payer: Self-pay | Admitting: Hematology

## 2015-06-09 VITALS — BP 114/74 | HR 65 | Temp 98.2°F | Resp 17 | Ht 67.0 in | Wt 226.7 lb

## 2015-06-09 DIAGNOSIS — M545 Low back pain: Secondary | ICD-10-CM | POA: Diagnosis not present

## 2015-06-09 DIAGNOSIS — Z79811 Long term (current) use of aromatase inhibitors: Secondary | ICD-10-CM

## 2015-06-09 DIAGNOSIS — R911 Solitary pulmonary nodule: Secondary | ICD-10-CM

## 2015-06-09 DIAGNOSIS — C50212 Malignant neoplasm of upper-inner quadrant of left female breast: Secondary | ICD-10-CM | POA: Diagnosis not present

## 2015-06-09 DIAGNOSIS — Z6841 Body Mass Index (BMI) 40.0 and over, adult: Secondary | ICD-10-CM

## 2015-06-09 DIAGNOSIS — E041 Nontoxic single thyroid nodule: Secondary | ICD-10-CM

## 2015-06-09 DIAGNOSIS — I152 Hypertension secondary to endocrine disorders: Secondary | ICD-10-CM

## 2015-06-09 LAB — CBC WITH DIFFERENTIAL/PLATELET
BASO%: 0.8 % (ref 0.0–2.0)
Basophils Absolute: 0.1 10*3/uL (ref 0.0–0.1)
EOS%: 2.6 % (ref 0.0–7.0)
Eosinophils Absolute: 0.2 10*3/uL (ref 0.0–0.5)
HCT: 40.3 % (ref 34.8–46.6)
HGB: 12.8 g/dL (ref 11.6–15.9)
LYMPH%: 34 % (ref 14.0–49.7)
MCH: 27.4 pg (ref 25.1–34.0)
MCHC: 31.7 g/dL (ref 31.5–36.0)
MCV: 86.5 fL (ref 79.5–101.0)
MONO#: 0.7 10*3/uL (ref 0.1–0.9)
MONO%: 8.5 % (ref 0.0–14.0)
NEUT#: 4.2 10*3/uL (ref 1.5–6.5)
NEUT%: 54.1 % (ref 38.4–76.8)
Platelets: 251 10*3/uL (ref 145–400)
RBC: 4.66 10*6/uL (ref 3.70–5.45)
RDW: 15 % — ABNORMAL HIGH (ref 11.2–14.5)
WBC: 7.8 10*3/uL (ref 3.9–10.3)
lymph#: 2.6 10*3/uL (ref 0.9–3.3)

## 2015-06-09 LAB — COMPREHENSIVE METABOLIC PANEL
ALT: 43 U/L (ref 0–55)
AST: 33 U/L (ref 5–34)
Albumin: 3.7 g/dL (ref 3.5–5.0)
Alkaline Phosphatase: 62 U/L (ref 40–150)
Anion Gap: 9 mEq/L (ref 3–11)
BUN: 20.4 mg/dL (ref 7.0–26.0)
CO2: 27 mEq/L (ref 22–29)
Calcium: 9.9 mg/dL (ref 8.4–10.4)
Chloride: 105 mEq/L (ref 98–109)
Creatinine: 1.2 mg/dL — ABNORMAL HIGH (ref 0.6–1.1)
EGFR: 50 mL/min/{1.73_m2} — ABNORMAL LOW (ref >90)
Glucose: 86 mg/dl (ref 70–140)
Potassium: 4.6 mEq/L (ref 3.5–5.1)
Sodium: 142 mEq/L (ref 136–145)
Total Bilirubin: 0.33 mg/dL (ref 0.20–1.20)
Total Protein: 7.5 g/dL (ref 6.4–8.3)

## 2015-06-09 NOTE — Progress Notes (Signed)
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 Ultrasound of the left axilla is negative.      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    12/29/2013 Surgery Left lumpectomy with close (<0.1cm) inferior margin   03/02/2014 Surgery Reexcision for close margin, path negative for malignancy.   04/05/2014 - 05/20/2014 Radiation Therapy adjuvant breast radiation    05/27/2014 Imaging Bone density scan: normal    CURRENT THERAPY: Exemestane 4m daily, started on 06/07/2014                                    INTERIM HISTORY: She returns for follow up. She  Is accompanied by her husband to the clinic today. She had thyroid nodule biopsy last month, which was benign. She tolerated biopsy well. She complains of bilateral low chest pain,  At her rib cage area, with some tenderness,  For the past a few months, which are new to her. She denies any injury. she  also has chronic mild  Pain at thoracic spine,  Which is stable. Her arthritis  Review the pain are stable. She feel fatigued, able to do daily activities, but not very active. No recent weight loss.  MEDICAL HISTORY:  Past Medical History  Diagnosis Date  . Obesity, morbid, BMI 40.0-49.9 ([facility]   . Hypertension 2014  . Hyperlipidemia   . PONV (postoperative nausea and vomiting)   . S/P radiation therapy 04/05/14-05/20/14    left breast 60.4Gy totaldose  . Diabetes mellitus ([facility] 09/16/13    Diagnosed on 09/16/13; HgA1C was 7.2/metformin  . Breast cancer ([facility] 12/09/13    left breast Invasive Ductal Carcinoma  . Dizziness   . GERD (gastroesophageal reflux disease)   . Headache     SURGICAL HISTORY: Past Surgical History  Procedure Laterality Date  . Abdominal hysterectomy N/A 09/20/2013    Procedure: HYSTERECTOMY ABDOMINAL;  Surgeon: UOsborne Oman MD;  [facility];  Service: Gynecology;  Laterality: N/A;  . Salpingoophorectomy  09/20/2013    Procedure: SALPINGO OOPHORECTOMY;  Surgeon: UOsborne Oman MD;  [facility];  Service: Gynecology;;  . Laparoscopic assisted vaginal hysterectomy  09/20/2013    Procedure: LAPAROSCOPIC ASSISTED VAGINAL HYSTERECTOMY;  Surgeon: UOsborne Oman MD;  [facility];  Service: Gynecology;;  PT WAS EXAMINED WHILE UP IN [place] AND IT WAS  DECIDED TO OPEN PT DUE TO LARGE MASS  . Breast lumpectomy with axillary lymph node biopsy Left 12/29/13    left breast   . Radioactive seed guided mastectomy with axillary sentinel lymph node biopsy Left 12/29/2013    Procedure: LEFT BREAST RADIOACTIVE SEED LOCALIZED LUMPECTOMY WITH SENTINEL LYMPH NODE MAPPING;  Surgeon: Erroll Luna, MD;  Location: [facility];  Service: General;  Laterality: Left;  . Re-excision of breast lumpectomy Left 03/02/2014    Procedure: RE-EXCISION OF LEFT BREAST LUMPECTOMY;  Surgeon: Erroll Luna, MD;  Location: [facility];  Service: General;   Laterality: Left;  . Lumbar laminectomy/decompression microdiscectomy Right 12/14/2014    Procedure: Right Lumbar three-four microdiskectomy;  Surgeon: Newman Pies, MD;  Location: [facility];  Service: Neurosurgery;  Laterality: Right;  Right Lumbar three-four microdiskectomy    SOCIAL HISTORY: Social History   Social History  . Marital Status: Married    Spouse Name: Troi Bechtold   . Number of Children: 3  . Years of Education: 12   Occupational History  .  Unemployed   Social History Main Topics  . Smoking status: Never Smoker   . Smokeless tobacco: Never Used  . Alcohol Use: No  . Drug Use: No  . Sexual Activity: Not Currently    Birth Control/ Protection: Post-menopausal   Other Topics Concern  . Not on file   Social History Narrative   Married to [employer] in 1986.    Has 3 children from previous marriage, 1 in [place], 1 in [place], 1 in prison ([employer]).    Lives with husband.    Right-handed.   1 cup caffeine per day.       FAMILY HISTORY: Family History  Problem Relation Age of Onset  . Diabetes Mother   . Multiple myeloma Mother 39  . Hearing loss Mother   . Diabetes Sister   . Diabetes Brother   . Cancer Brother 40    prostate cancer   . Diabetes Son   . Diabetes Maternal Aunt   . Leukemia Maternal Aunt     dx in her 45s  . Alcoholism Brother   . Heart attack Father     ALLERGIES:  has No Known Allergies.  MEDICATIONS:  Current Outpatient Prescriptions  Medication Sig Dispense Refill  . ACCU-CHEK FASTCLIX LANCETS MISC 1 Units by Percutaneous route 4 (four) times daily. 100 each 12  . Ascorbic Acid (VITAMIN C) 1000 MG tablet Take 1,000 mg by mouth daily.    Marland Kitchen aspirin 325 MG tablet Take 325 mg by mouth once.    . Blood Glucose Monitoring Suppl (ACCU-CHEK NANO SMARTVIEW) W/DEVICE KIT 1 kit by Subdermal route as directed. Check blood sugars for fasting, and two hours after breakfast, lunch and dinner (4 checks daily) 1 kit 0   . cyclobenzaprine (FLEXERIL) 10 MG tablet Take 1 tablet (10 mg total) by mouth 3 (three) times daily as needed for muscle spasms. 30 tablet 0  . exemestane (AROMASIN) 25 MG tablet Take 1 tablet (25 mg total) by mouth daily after breakfast. 30 tablet 3  . gabapentin (NEURONTIN) 300 MG capsule 300 mg during the day, 600 mg at night by mouth 270 capsule 1  . glucose blood (ACCU-CHEK SMARTVIEW) test strip 1 each by Other route 3 (three) times daily. ICD 10 E11.9 100 each 12  . hydrochlorothiazide (HYDRODIURIL) 25 MG tablet Take 1 tablet (25 mg total) by mouth daily. Pt needs office visit 90 tablet 2  .  Lancets (ACCU-CHEK MULTICLIX) lancets 1 each by Other route 3 (three) times daily. ICD 10 E11.9 100 each 12  . lisinopril (PRINIVIL,ZESTRIL) 20 MG tablet Take 1 tablet (20 mg total) by mouth daily. 90 tablet 2  . magnesium hydroxide (MILK OF MAGNESIA) 400 MG/5ML suspension Take 30 mLs by mouth as needed for mild constipation.    . metFORMIN (GLUCOPHAGE) 500 MG tablet Take 1 tablet (500 mg total) by mouth 2 (two) times daily. 180 tablet 3  . Multiple Vitamins-Minerals (MULTIVITAMIN WITH MINERALS) tablet Take 1 tablet by mouth daily. Reported on 03/13/2015    . ondansetron (ZOFRAN) 8 MG tablet Take 1 tablet (8 mg total) by mouth every 8 (eight) hours as needed for nausea or vomiting. 30 tablet 0  . senna-docusate (SENOKOT-S) 8.6-50 MG tablet Take 2 tablets by mouth at bedtime as needed for mild constipation. 60 tablet 11  . traMADol (ULTRAM) 50 MG tablet Take 1 tablet (50 mg total) by mouth every 8 (eight) hours as needed. 90 tablet 2  . vitamin D, CHOLECALCIFEROL, 400 UNITS tablet Take 400 Units by mouth daily.     No current facility-administered medications for this visit.    REVIEW OF SYSTEMS:   Constitutional: Denies fevers, chills or abnormal night sweats, (+)  fatigue Eyes: (+) blurriness of vision, no double vision or watery eyes Ears, nose, mouth, throat, and face: Denies mucositis or sore  throat Respiratory: (+) dyspnea on exertion, Denies cough or wheezes Cardiovascular: Denies palpitation, chest discomfort or lower extremity swelling Gastrointestinal:  Denies nausea, heartburn or change in bowel habits Skin: Denies abnormal skin rashes Lymphatics: Denies new lymphadenopathy or easy bruising Neurological:Denies numbness, tingling or new weaknesses MSK: (+)  Bilateral rib cage pain, mid thoracic pain and diffuse arthralgia Behavioral/Psych: Mood is stable, no new changes  All other systems were reviewed with the patient and are negative.  PHYSICAL EXAMINATION: ECOG PERFORMANCE STATUS: 2  BP 114/74 mmHg  Pulse 65  Temp(Src) 98.2 F (36.8 C) (Oral)  Resp 17  Ht '5\' 7"'  (1.702 m)  Wt 226 lb 11.2 oz (102.83 kg)  BMI 35.50 kg/m2  SpO2 98%  GENERAL:alert, no distress and comfortable SKIN: skin color, texture, turgor are normal, no rashes or significant lesions EYES: normal, conjunctiva are pink and non-injected, sclera clear OROPHARYNX:no exudate, no erythema and lips, buccal mucosa, and tongue normal  NECK: supple, thyroid normal size, non-tender, without nodularity LYMPH:  no palpable lymphadenopathy in the cervical, axillary or inguinal LUNGS: clear to auscultation and percussion with normal breathing effort HEART: regular rate & rhythm and no murmurs and no lower extremity edema ABDOMEN:abdomen soft, nontender, no rebound pain normal bowel sounds Musculoskeletal:no cyanosis of digits and no clubbing, (+)  Tenderness at the bilateral low rib cage, in the mid spine. PSYCH: alert & oriented x 3 with fluent speech NEURO: no focal motor/sensory deficits Breasts: Breast inspection showed them to be symmetrical with no nipple discharge. Surgical scar at the left upper breast and axillary area are well healed. Palpation of the right breasts and axilla revealed no obvious mass that I could appreciate.   I have reviewed the data as listed LABORATORY DATA:  CBC Latest Ref Rng  06/09/2015 03/13/2015 12/08/2014  WBC 3.9 - 10.3 10e3/uL 7.8 9.3 7.8  Hemoglobin 11.6 - 15.9 g/dL 12.8 13.1 13.0  Hematocrit 34.8 - 46.6 % 40.3 40.7 41.0  Platelets 145 - 400 10e3/uL 251 300 279    CMP Latest Ref Rng 06/09/2015 03/13/2015 02/24/2015  Glucose 70 - 140  mg/dl 86 99 117(H)  BUN 7.0 - 26.0 mg/dL 20.4 20.4 21  Creatinine 0.6 - 1.1 mg/dL 1.2(H) 1.1 0.91  Sodium 136 - 145 mEq/L 142 143 139  Potassium 3.5 - 5.1 mEq/L 4.6 3.9 4.1  Chloride 98 - 110 mmol/L - - 101  CO2 22 - 29 mEq/L '27 26 26  ' Calcium 8.4 - 10.4 mg/dL 9.9 9.9 9.5  Total Protein 6.4 - 8.3 g/dL 7.5 8.0 -  Total Bilirubin 0.20 - 1.20 mg/dL 0.33 0.34 -  Alkaline Phos 40 - 150 U/L 62 64 -  AST 5 - 34 U/L 33 29 -  ALT 0 - 55 U/L 43 46 -     PATHOLOGY REPORT Diagnosis 12/29/2013 1. Breast, lumpectomy, left - INVASIVE GRADE II DUCTAL CARCINOMA, SPANNING 1.7 CM IN GREATEST DIMENSION. - SECOND FOCUS OF INVASIVE GRADE I DUCTAL CARCINOMA, SPANNING 0.5 CM IN GREATEST DIMENSION. - INTERMEDIATE GRADE DUCTAL CARCINOMA IN SITU ASSOCIATED WITH BOTH FOCI OF TUMOR. - INVASIVE DUCTAL CARCINOMA IS EXTREMELY CLOSE (LESS THAN 0.1 CM) TO INFERIOR MARGIN. - DUCTAL CARCINOMA IN SITU IS CLOSE (0.1 CM) TO INFERIOR MARGIN. - OTHER MARGINS NEGATIVE. - SEE ONCOLOGY TEMPLATE. 2. Lymph node, sentinel, biopsy, Left axillary 1 of 4 FINAL for RANDYE, TREICHLER ([id number]) Diagnosis(continued) - ONE BENIGN LYMPH NODE WITH NO TUMOR SEEN (0/1). 3. Lymph node, sentinel, biopsy, Left axilla - ONE BENIGN LYMPH NODE WITH NO TUMOR SEEN (0/1). Microscopic Comment 1. BREAST, INVASIVE TUMOR, WITH LYMPH NODES PRESENT Specimen, including laterality and lymph node sampling (sentinel, non-sentinel): Left partial breast with left sentinel lymph node sampling. Procedure: Left breast lumpectomy with sentinel lymph node biopsies. Histologic type: Invasive ductal carcinoma. Larger focus: Grade: II. Tubule formation: 2. Nuclear pleomorphism: 2. Mitotic:  3. Smaller focus (near inferior margin): Grade: I. Tubule formation: 1. Nuclear pleomorphism: 2. Mitotic: 1. Tumor sizes (gross and glass slide measurement): 1.7 cm and 0.5 cm. Margins: Invasive, distance to closest margin: less than 0.1 cm (inferior margin). In-situ, distance to closest margin: 0.1 cm (inferior margin). Lymphovascular invasion: Definitive lymphovascular invasion is not identified. Ductal carcinoma in situ: Yes. Grade: Intermediate grade. Extensive intraductal component: There is an extensive intraductal component of ductal carcinoma in situ on the smaller focus but not on the larger focus. Lobular neoplasia: No. Tumor focality: Two foci, multifocal. Treatment effect: N/A. Extent of tumor: Tumor confined to breast parenchyma. Skin: Not received. Nipple: Not received. Skeletal muscle: Not received. Lymph nodes: Examined: 2 Sentinel. 0 Non-sentinel. 2 Total. Lymph nodes with metastasis: 0. Breast prognostic profile: Performed on previous case ([id number]). Estrogen receptor: 100%, positive. Progesterone receptor: 96%, positive. Her 2 neu by CISH: 0.81 ratio, no amplification. Ki-67: 73%. Non-neoplastic breast: Fat necrosis. TNM: pT1c(m), pN0, MX. Comments: A smooth muscle myosin, calponin, and p63 immunohistochemical stain are performed on a single block, which helps to confirm the presence of a second focus of invasive ductal carcinoma associated 2 of 4 FINAL for MACKENSI, MAHADEO ([id number]) Microscopic Comment(continued) with ductal carcinoma in situ. As Her-2 neu by CISH was negative on the initial biopsy, this will be repeated on the larger tumor focus and reported in an addendum to follow. A breast prognostic profile will not be performed on the smaller tumor focus unless otherwise requested (the tumor foci although different grades show morphologically similar nuclear features). (RH:ds 12/31/13)  Oncotype DX Score: 23 (Intermediate risk)  (ER+/PR+/HER2-)   Diagnosis 05/09/2015 THYROID, FINE NEEDLE ASPIRATION, RLP (SPECIMEN 1 OF 2 COLLECTED 05-09-2015) CONSISTENT WITH BENIGN FOLLICULAR NODULE (BETHESDA CATEGORY  II).  Diagnosis 05/09/2015 THYROID, FINE NEEDLE ASPIRATION, LMP (SPECIMEN 2 OF 2 COLLECTED 05-09-2015) CONSISTENT WITH BENIGN FOLLICULAR NODULE (BETHESDA CATEGORY II).   RADIOGRAPHIC STUDIES: I have personally reviewed the radiological images as listed and agreed with the findings in the report.  Mammogram 02/14/2015 IMPRESSION: No findings worrisome for recurrent tumor or developing malignancy.  RECOMMENDATION: Bilateral diagnostic mammography in 1 year.   US SOFT TISSUE HEAD AND NECK 04/03/2015 IMPRESSION: Multi bilateral nodules. Largest on the right measures 19 mm. Largest on left measures 21 mm. Findings meet consensus criteria for biopsy. Ultrasound-guided fine needle aspiration should be considered, as per the consensus statement: Management of Thyroid Nodules Detected at [place]: Society of Radiologists in [place]. Radiology 2005; [id number].   ASSESSMENT & PLAN:  65 year old Caucasian female, with past medical history of hypertension, diabetes, dyslipidemia, obesity who was found to have a left breast mass by screening mammogram.  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 Grade 2, Ki-67 73%, status post lumpectomy with close margin. -She is status post complete surgical resection with lumpectomy. -Her breast cancer is likely cured by surgery alone, but does has some risk of disease recurrence in the future. -I discussed the Oncotype DX result with her and her husband. The recurrence score is 23, with the predicted 10 year recurrence risk of 15% with tamoxifen alone. The benefit of chemotherapy is uncertain in this intermediate risk group. I do not strongly recommend adjuvant chemotherapy. Patient agree with not  having chemotherapy.  -Due to the strong positivity of ER and PR, I recommend adjuvant  Aromasin for total 5 years -She has been tolerating aromasin well, will continue for a total of 5 years  - due to the new onset bilateral rib pain and midthoracic pain, I'll obtain a bone scan to ruled out a bone metastasis. - lab reviewed with her.  Her last mammogram in generally 2017 was normal. - she will continue  Surveillance - I encouraged her to eat healthy, try to be more physically active, and try to lose some weight.  #2 Bone health -Her recent bone density scan was normal in 05/2014 -We discussed Aromasin may weak her bone, she will continue calcium and vitamin D.  #3. hypertension, diabetes, dyslipidemia, chronic low back pain -She will continue follow-up with her primary care physician. -she has recovered well from her back surgery , although she may need another one sometime this year  #4 Morbid obesity -We discussed healthy diet and regular exercise. She is willing to try after she recovers well from her back surgery  #5 new right lung nodule -She had a CT abdomen and pelvis in June 2016, which incidentally found a 4 mm nodule in the right lower lobe. I personally reviewed the image.  -She never smoked, risk of lung cancer is low  -her repeated CT chest from 12/09/2014, which showed stable small lung nodules, likely benign. -We'll continue monitoring, consider repeating CT scan in 6-12 months.  #6. Thyroid  Benign follicular nodule -Her CT scan revealed a 1.7 cm right thyroid nodule. - ultrasound guided thyroid nodule biopsy showed benign follicular nodule   PLAN: -bone scan in a few weeks,  I'll call her  If the bone scan was negative. -continue exemastane --Return to clinic in 3 months with lab,  Soon if the bone scan is abnormal.  All questions were answered. The patient knows to call the clinic with any problems, questions or concerns.  I spent 20 minutes counseling the patient  face to face. The total time spent in the appointment was 25 minutes and more than 50% was on counseling.     Truitt Merle, MD 06/09/2015 2:10 PM

## 2015-06-09 NOTE — Telephone Encounter (Signed)
Gave pt appt & AVS °

## 2015-06-19 ENCOUNTER — Telehealth: Payer: Self-pay | Admitting: Family Medicine

## 2015-06-19 NOTE — Telephone Encounter (Signed)
Pt called states has missed call but pt has no voicemail, please f/up

## 2015-06-20 ENCOUNTER — Encounter (HOSPITAL_COMMUNITY)
Admission: RE | Admit: 2015-06-20 | Discharge: 2015-06-20 | Disposition: A | Discharge status: Home / Self Care | Payer: Medicare Other | Source: Ambulatory Visit | Attending: Hematology | Admitting: Hematology

## 2015-06-20 ENCOUNTER — Ambulatory Visit (HOSPITAL_COMMUNITY)
Admission: RE | Admit: 2015-06-20 | Discharge: 2015-06-20 | Disposition: A | Discharge status: Home / Self Care | Payer: Medicare Other | Source: Ambulatory Visit | Attending: Hematology | Admitting: Hematology

## 2015-06-20 DIAGNOSIS — C50212 Malignant neoplasm of upper-inner quadrant of left female breast: Secondary | ICD-10-CM | POA: Insufficient documentation

## 2015-06-20 DIAGNOSIS — C50912 Malignant neoplasm of unspecified site of left female breast: Secondary | ICD-10-CM | POA: Diagnosis not present

## 2015-06-20 DIAGNOSIS — R938 Abnormal findings on diagnostic imaging of other specified body structures: Secondary | ICD-10-CM | POA: Insufficient documentation

## 2015-06-20 MED ORDER — TECHNETIUM TC 99M MEDRONATE IV KIT
27.0000 | PACK | Freq: Once | INTRAVENOUS | Status: AC | PRN
  Administered 2015-06-20: 27 via INTRAVENOUS

## 2015-06-21 NOTE — Telephone Encounter (Signed)
Pt stated received a call form [place]. No voice message left  Pt stated No question as this time

## 2015-06-21 NOTE — Telephone Encounter (Signed)
Patient returned nurse phone call. °Please follow up. °

## 2015-06-27 ENCOUNTER — Telehealth: Payer: Self-pay | Admitting: Medical Oncology

## 2015-06-27 NOTE — Telephone Encounter (Signed)
I left a message with person on phone to tell pt to call back to Dr Ernestina Penna nurse ( Per Dr Burr Medico, " Bone scan was negative for bone metastasis")

## 2015-06-28 ENCOUNTER — Other Ambulatory Visit: Payer: Self-pay | Admitting: *Deleted

## 2015-06-28 DIAGNOSIS — M545 Low back pain, unspecified: Secondary | ICD-10-CM

## 2015-06-28 DIAGNOSIS — M79605 Pain in left leg: Principal | ICD-10-CM

## 2015-06-28 MED ORDER — GABAPENTIN 300 MG PO CAPS: ORAL_CAPSULE | ORAL | Status: DC

## 2015-06-29 ENCOUNTER — Telehealth: Payer: Self-pay | Admitting: *Deleted

## 2015-06-29 ENCOUNTER — Telehealth: Payer: Self-pay | Admitting: Pharmacist

## 2015-06-29 NOTE — Telephone Encounter (Signed)
Spoke with pt and informed pt re:  Bone scan result was negative for bone metastasis as per Dr. Burr Medico.   Pt voiced understanding.

## 2015-06-29 NOTE — Telephone Encounter (Signed)
Received fax notification on 06/26/15 that Aromasin 25mg  tablets, # 30 was shipped to the patient's home address on 06/26/15.

## 2015-08-17 ENCOUNTER — Encounter (HOSPITAL_COMMUNITY): Payer: Self-pay | Admitting: *Deleted

## 2015-08-17 ENCOUNTER — Emergency Department (HOSPITAL_COMMUNITY): Payer: Medicare Other

## 2015-08-17 DIAGNOSIS — Z853 Personal history of malignant neoplasm of breast: Secondary | ICD-10-CM | POA: Insufficient documentation

## 2015-08-17 DIAGNOSIS — E119 Type 2 diabetes mellitus without complications: Secondary | ICD-10-CM | POA: Insufficient documentation

## 2015-08-17 DIAGNOSIS — R0602 Shortness of breath: Secondary | ICD-10-CM | POA: Diagnosis not present

## 2015-08-17 DIAGNOSIS — R0789 Other chest pain: Secondary | ICD-10-CM | POA: Diagnosis not present

## 2015-08-17 DIAGNOSIS — R911 Solitary pulmonary nodule: Secondary | ICD-10-CM | POA: Insufficient documentation

## 2015-08-17 DIAGNOSIS — R079 Chest pain, unspecified: Secondary | ICD-10-CM | POA: Diagnosis not present

## 2015-08-17 DIAGNOSIS — Z79899 Other long term (current) drug therapy: Secondary | ICD-10-CM | POA: Insufficient documentation

## 2015-08-17 DIAGNOSIS — Z7982 Long term (current) use of aspirin: Secondary | ICD-10-CM | POA: Insufficient documentation

## 2015-08-17 DIAGNOSIS — Z7984 Long term (current) use of oral hypoglycemic drugs: Secondary | ICD-10-CM | POA: Insufficient documentation

## 2015-08-17 DIAGNOSIS — I10 Essential (primary) hypertension: Secondary | ICD-10-CM | POA: Insufficient documentation

## 2015-08-17 LAB — BASIC METABOLIC PANEL
Anion gap: 10 (ref 5–15)
BUN: 18 mg/dL (ref 6–20)
CO2: 27 mmol/L (ref 22–32)
Calcium: 9.6 mg/dL (ref 8.9–10.3)
Chloride: 102 mmol/L (ref 101–111)
Creatinine, Ser: 1.15 mg/dL — ABNORMAL HIGH (ref 0.44–1.00)
GFR calc Af Amer: 57 mL/min — ABNORMAL LOW (ref >60)
GFR calc non Af Amer: 49 mL/min — ABNORMAL LOW (ref >60)
Glucose, Bld: 116 mg/dL — ABNORMAL HIGH (ref 65–99)
Potassium: 3.8 mmol/L (ref 3.5–5.1)
Sodium: 139 mmol/L (ref 135–145)

## 2015-08-17 LAB — CBC
HCT: 40.4 % (ref 36.0–46.0)
Hemoglobin: 12.6 g/dL (ref 12.0–15.0)
MCH: 27.5 pg (ref 26.0–34.0)
MCHC: 31.2 g/dL (ref 30.0–36.0)
MCV: 88 fL (ref 78.0–100.0)
Platelets: 296 10*3/uL (ref 150–400)
RBC: 4.59 MIL/uL (ref 3.87–5.11)
RDW: 14.4 % (ref 11.5–15.5)
WBC: 10 10*3/uL (ref 4.0–10.5)

## 2015-08-17 LAB — I-STAT TROPONIN, ED: Troponin i, poc: 0 ng/mL (ref 0.00–0.08)

## 2015-08-17 NOTE — ED Notes (Signed)
Pt states she is having left chest pain. States that it feels like it is under her breast. States she has breast cancer and receives chemo pills for treatment.

## 2015-08-18 ENCOUNTER — Emergency Department (HOSPITAL_COMMUNITY)
Admission: EM | Admit: 2015-08-18 | Discharge: 2015-08-18 | Disposition: A | Discharge status: Home / Self Care | Payer: Medicare Other | Attending: Emergency Medicine | Admitting: Emergency Medicine

## 2015-08-18 ENCOUNTER — Emergency Department (HOSPITAL_COMMUNITY): Payer: Medicare Other

## 2015-08-18 DIAGNOSIS — R911 Solitary pulmonary nodule: Secondary | ICD-10-CM

## 2015-08-18 DIAGNOSIS — R079 Chest pain, unspecified: Secondary | ICD-10-CM | POA: Diagnosis not present

## 2015-08-18 DIAGNOSIS — R0789 Other chest pain: Secondary | ICD-10-CM

## 2015-08-18 LAB — I-STAT TROPONIN, ED: Troponin i, poc: 0 ng/mL (ref 0.00–0.08)

## 2015-08-18 MED ORDER — OXYCODONE-ACETAMINOPHEN 5-325 MG PO TABS: 1.0000 | ORAL_TABLET | ORAL | Status: DC | PRN

## 2015-08-18 MED ORDER — ONDANSETRON HCL 4 MG/2ML IJ SOLN
4.0000 mg | Freq: Once | INTRAMUSCULAR | Status: AC
  Administered 2015-08-18: 4 mg via INTRAVENOUS
  Filled 2015-08-18: qty 2

## 2015-08-18 MED ORDER — IOPAMIDOL (ISOVUE-370) INJECTION 76%
INTRAVENOUS | Status: AC
  Administered 2015-08-18: 100 mL
  Filled 2015-08-18: qty 100

## 2015-08-18 MED ORDER — MORPHINE SULFATE (PF) 4 MG/ML IV SOLN
4.0000 mg | Freq: Once | INTRAVENOUS | Status: AC
Start: 2015-08-18 — End: 2015-08-18
  Administered 2015-08-18: 4 mg via INTRAVENOUS
  Filled 2015-08-18: qty 1

## 2015-08-18 MED ORDER — SODIUM CHLORIDE 0.9 % IV BOLUS (SEPSIS)
500.0000 mL | Freq: Once | INTRAVENOUS | Status: AC
  Administered 2015-08-18: 500 mL via INTRAVENOUS

## 2015-08-18 NOTE — Discharge Instructions (Signed)
There is a nodule in your lung. This has been seen previously and has not changed. It is unlikely to be anything significant, but recommendation is for your doctor to repeat CAT scan in one year.  Chest Wall Pain Chest wall pain is pain in or around the bones and muscles of your chest. Sometimes, an injury causes this pain. Sometimes, the cause may not be known. This pain may take several weeks or longer to get better. HOME CARE INSTRUCTIONS  Pay attention to any changes in your symptoms. Take these actions to help with your pain:   Rest as told by your health care provider.   Avoid activities that cause pain. These include any activities that use your chest muscles or your abdominal and side muscles to lift heavy items.   If directed, apply ice to the painful area:  Put ice in a plastic bag.  Place a towel between your skin and the bag.  Leave the ice on for 20 minutes, 2-3 times per day.  Take over-the-counter and prescription medicines only as told by your health care provider.  Do not use tobacco products, including cigarettes, chewing tobacco, and e-cigarettes. If you need help quitting, ask your health care provider.  Keep all follow-up visits as told by your health care provider. This is important. SEEK MEDICAL CARE IF:  You have a fever.  Your chest pain becomes worse.  You have new symptoms. SEEK IMMEDIATE MEDICAL CARE IF:  You have nausea or vomiting.  You feel sweaty or light-headed.  You have a cough with phlegm (sputum) or you cough up blood.  You develop shortness of breath.   This information is not intended to replace advice given to you by your health care provider. Make sure you discuss any questions you have with your health care provider.   Document Released: 01/21/2005 Document Revised: 10/12/2014 Document Reviewed: 04/18/2014 Elsevier Interactive Patient Education [employer].

## 2015-08-18 NOTE — ED Provider Notes (Signed)
CSN: [id number]     Arrival date & time 08/17/15  2239 History  By signing my name below, I, Evelene Croon, attest that this documentation has been prepared under the direction and in the presence of Orpah Greek, MD . Electronically Signed: Evelene Croon, Scribe. 08/18/2015. 2:22 AM.    Chief Complaint  Patient presents with  . Chest Pain   The history is provided by the patient. No language interpreter was used.     HPI Comments:  Rachel Herman is a 65 y.o. female with a history of HTN, HLD, and DM, who presents to the Emergency Department complaining of moderate pain to her left chest, under the left breast, which began ~ 1930 while at rest. Pt describes her pain as sharp and stabbing. Her pain is exacerbated with movement and with inspiration. She also notes the site is sore to the touch. She denies rash to the site. Pt has had 2 past surgeries on her breast secondary to breast CA. No alleviating factors noted.   Past Medical History  Diagnosis Date  . Obesity, morbid, BMI 40.0-49.9 ([place])   . Hypertension 2014  . Hyperlipidemia   . PONV (postoperative nausea and vomiting)   . S/P radiation therapy 04/05/14-05/20/14    left breast 60.4Gy totaldose  . Diabetes mellitus ([place]) 09/16/13    Diagnosed on 09/16/13; HgA1C was 7.2/metformin  . Breast cancer ([place]) 12/09/13    left breast Invasive Ductal Carcinoma  . Dizziness   . GERD (gastroesophageal reflux disease)   . Headache    Past Surgical History  Procedure Laterality Date  . Abdominal hysterectomy N/A 09/20/2013    Procedure: HYSTERECTOMY ABDOMINAL;  Surgeon: Osborne Oman, MD;  Location: [facility];  Service: Gynecology;  Laterality: N/A;  . Salpingoophorectomy  09/20/2013    Procedure: SALPINGO OOPHORECTOMY;  Surgeon: Osborne Oman, MD;  Location: [facility];  Service: Gynecology;;  . Laparoscopic assisted vaginal hysterectomy  09/20/2013    Procedure: LAPAROSCOPIC ASSISTED VAGINAL HYSTERECTOMY;  Surgeon: Osborne Oman, MD;  Location: [facility];  Service: Gynecology;;  PT WAS EXAMINED WHILE UP IN [place] AND IT WAS DECIDED TO OPEN PT DUE TO LARGE MASS  . Breast lumpectomy with axillary lymph node biopsy Left 12/29/13    left breast   . Radioactive seed guided mastectomy with axillary sentinel lymph node biopsy Left 12/29/2013    Procedure: LEFT BREAST RADIOACTIVE SEED LOCALIZED LUMPECTOMY WITH SENTINEL LYMPH NODE MAPPING;  Surgeon: Erroll Luna, MD;  Location: [facility];  Service: General;  Laterality: Left;  . Re-excision of breast lumpectomy Left 03/02/2014    Procedure: RE-EXCISION OF LEFT BREAST LUMPECTOMY;  Surgeon: Erroll Luna, MD;  Location: [facility];  Service: General;  Laterality: Left;  . Lumbar laminectomy/decompression microdiscectomy Right 12/14/2014    Procedure: Right Lumbar three-four microdiskectomy;  Surgeon: Newman Pies, MD;  Location: [facility];  Service: Neurosurgery;  Laterality: Right;  Right Lumbar three-four microdiskectomy   Family History  Problem Relation Age of Onset  . Diabetes Mother   . Multiple myeloma Mother 73  . Hearing loss Mother   . Diabetes Sister   . Diabetes Brother   . Cancer Brother 40    prostate cancer   . Diabetes Son   . Diabetes Maternal Aunt   . Leukemia Maternal Aunt     dx in her 39s  . Alcoholism Brother   . Heart attack Father    Social History  Substance Use Topics  .  Smoking status: Never Smoker   . Smokeless tobacco: Never Used  . Alcohol Use: No   OB History    Gravida Para Term Preterm AB TAB SAB Ectopic Multiple Living   '3 3 3 ' 0 0 0 0 0 0 3     Review of Systems  Constitutional: Negative for fever and chills.  Respiratory: Negative for shortness of breath.   Cardiovascular: Positive for chest pain.  Gastrointestinal: Negative for vomiting.  All other systems reviewed and are negative.  Allergies  Review of patient's allergies indicates no known allergies.  Home Medications    Prior to Admission medications   Medication Sig Start Date End Date Taking? Authorizing Provider  ACCU-CHEK FASTCLIX LANCETS MISC 1 Units by Percutaneous route 4 (four) times daily. 09/22/13   Osborne Oman, MD  Ascorbic Acid (VITAMIN C) 1000 MG tablet Take 1,000 mg by mouth daily.    Historical Provider, MD  aspirin 325 MG tablet Take 325 mg by mouth once.    Historical Provider, MD  Blood Glucose Monitoring Suppl (ACCU-CHEK NANO SMARTVIEW) W/DEVICE KIT 1 kit by Subdermal route as directed. Check blood sugars for fasting, and two hours after breakfast, lunch and dinner (4 checks daily) 09/22/13   Osborne Oman, MD  cyclobenzaprine (FLEXERIL) 10 MG tablet Take 1 tablet (10 mg total) by mouth 3 (three) times daily as needed for muscle spasms. 12/15/14   Newman Pies, MD  exemestane (AROMASIN) 25 MG tablet Take 1 tablet (25 mg total) by mouth daily after breakfast. 12/12/14   Truitt Merle, MD  gabapentin (NEURONTIN) 300 MG capsule 300 mg during the day, 600 mg at night by mouth 06/28/15   Josalyn Funches, MD  glucose blood (ACCU-CHEK SMARTVIEW) test strip 1 each by Other route 3 (three) times daily. ICD 10 E11.9 06/02/14   Boykin Nearing, MD  hydrochlorothiazide (HYDRODIURIL) 25 MG tablet Take 1 tablet (25 mg total) by mouth daily. Pt needs office visit 02/14/15   Boykin Nearing, MD  Lancets (ACCU-CHEK MULTICLIX) lancets 1 each by Other route 3 (three) times daily. ICD 10 E11.9 06/02/14   Josalyn Funches, MD  lisinopril (PRINIVIL,ZESTRIL) 20 MG tablet Take 1 tablet (20 mg total) by mouth daily. 02/15/15   Josalyn Funches, MD  magnesium hydroxide (MILK OF MAGNESIA) 400 MG/5ML suspension Take 30 mLs by mouth as needed for mild constipation.    Historical Provider, MD  metFORMIN (GLUCOPHAGE) 500 MG tablet Take 1 tablet (500 mg total) by mouth 2 (two) times daily. 03/30/14   Boykin Nearing, MD  Multiple Vitamins-Minerals (MULTIVITAMIN WITH MINERALS) tablet Take 1 tablet by mouth daily. Reported on  03/13/2015    Historical Provider, MD  ondansetron (ZOFRAN) 8 MG tablet Take 1 tablet (8 mg total) by mouth every 8 (eight) hours as needed for nausea or vomiting. 06/02/14   Boykin Nearing, MD  senna-docusate (SENOKOT-S) 8.6-50 MG tablet Take 2 tablets by mouth at bedtime as needed for mild constipation. 05/24/15   Josalyn Funches, MD  traMADol (ULTRAM) 50 MG tablet Take 1 tablet (50 mg total) by mouth every 8 (eight) hours as needed. 05/24/15   Josalyn Funches, MD  vitamin D, CHOLECALCIFEROL, 400 UNITS tablet Take 400 Units by mouth daily.    Historical Provider, MD   BP 116/63 mmHg  Pulse 83  Temp(Src) 97.5 F (36.4 C) (Oral)  Resp 18  SpO2 99% Physical Exam  Constitutional: She is oriented to person, place, and time. She appears well-developed and well-nourished. No distress.  HENT:  Head:  Normocephalic and atraumatic.  Right Ear: Hearing normal.  Left Ear: Hearing normal.  Nose: Nose normal.  Mouth/Throat: Oropharynx is clear and moist and mucous membranes are normal.  Eyes: Conjunctivae and EOM are normal. Pupils are equal, round, and reactive to light.  Neck: Normal range of motion. Neck supple.  Cardiovascular: Regular rhythm, S1 normal and S2 normal.  Exam reveals no gallop and no friction rub.   No murmur heard. Pulmonary/Chest: Effort normal and breath sounds normal. No respiratory distress. She exhibits tenderness.  Diffuse left sided chest tenderness   Abdominal: Soft. Normal appearance and bowel sounds are normal. There is no hepatosplenomegaly. There is no tenderness. There is no rebound, no guarding, no tenderness at McBurney's point and negative Murphy's sign. No hernia.  Musculoskeletal: Normal range of motion.  Neurological: She is alert and oriented to person, place, and time. She has normal strength. No cranial nerve deficit or sensory deficit. Coordination normal. GCS eye subscore is 4. GCS verbal subscore is 5. GCS motor subscore is 6.  Skin: Skin is warm, dry and  intact. No rash noted. No cyanosis.  Psychiatric: She has a normal mood and affect. Her speech is normal and behavior is normal. Thought content normal.  Nursing note and vitals reviewed.   ED Course  Procedures   DIAGNOSTIC STUDIES:  Oxygen Saturation is 99% on RA, normal by my interpretation.    COORDINATION OF CARE:  2:06 AM Discussed treatment plan with pt at bedside and pt agreed to plan.  Labs Review Labs Reviewed  BASIC METABOLIC PANEL - Abnormal; Notable for the following:    Glucose, Bld 116 (*)    Creatinine, Ser 1.15 (*)    GFR calc non Af Amer 49 (*)    GFR calc Af Amer 57 (*)    All other components within normal limits  CBC  I-STAT TROPOININ, ED    Imaging Review Dg Chest 2 View  08/17/2015  CLINICAL DATA:  Left-sided chest pain, shortness of breath EXAM: CHEST  2 VIEW COMPARISON:  CT chest 12/09/2014 FINDINGS: The heart size and mediastinal contours are within normal limits. Both lungs are clear. The visualized skeletal structures are unremarkable. IMPRESSION: No active cardiopulmonary disease. Electronically Signed   By: Kathreen Devoid   On: 08/17/2015 23:28   I have personally reviewed and evaluated these images and lab results as part of my medical decision-making.   EKG Interpretation   Date/Time:  Thursday August 17 2015 22:48:12 EDT Ventricular Rate:  95 PR Interval:  202 QRS Duration: 74 QT Interval:  340 QTC Calculation: 427 R Axis:   58 Text Interpretation:  Unusual P axis, possible ectopic atrial rhythm No  significant change since last tracing Confirmed by POLLINA  MD,  CHRISTOPHER [phone]) on 08/18/2015 2:05:30 AM      MDM   Final diagnoses:  None  chest wall pain Pulmonary nodule  Presents to the emergency department for evaluation of chest pain. Patient reports ongoing sharp pains in the left chest that worsened with movement or taking a deep breath. The area was very tender to the touch without signs of trauma or overlying skin changes.  Patient's symptoms are very atypical for cardiac etiology. EKG does not show evidence of ischemia or infarct. Troponin negative 2. Because patient does have a cancer history, PE was considered. CT angiography did not show any acute abnormality, there is a subpleural nodule that will need to be followed up by primary doctor, likely repeat CT in one year. Patient's  pain appears to be musculoskeletal in nature and will be treated with analgesia.  I personally performed the services described in this documentation, which was scribed in my presence. The recorded information has been reviewed and is accurate.    Orpah Greek, MD 08/18/15 [phone]

## 2015-08-18 NOTE — ED Notes (Signed)
Pt transported to CT ?

## 2015-08-21 ENCOUNTER — Telehealth: Payer: Self-pay

## 2015-08-21 ENCOUNTER — Telehealth: Payer: Self-pay | Admitting: Family Medicine

## 2015-08-21 DIAGNOSIS — M79605 Pain in left leg: Principal | ICD-10-CM

## 2015-08-21 DIAGNOSIS — M542 Cervicalgia: Secondary | ICD-10-CM

## 2015-08-21 DIAGNOSIS — M545 Low back pain, unspecified: Secondary | ICD-10-CM

## 2015-08-21 DIAGNOSIS — M4726 Other spondylosis with radiculopathy, lumbar region: Secondary | ICD-10-CM

## 2015-08-21 MED ORDER — TRAMADOL HCL 50 MG PO TABS: 50.0000 mg | ORAL_TABLET | Freq: Three times a day (TID) | ORAL | Status: DC | PRN

## 2015-08-21 NOTE — Telephone Encounter (Signed)
Tramadol ready for pick up  

## 2015-08-21 NOTE — Telephone Encounter (Signed)
Contacted pt to inform her that his rx was ready for pick up. Pt stated she will be by tomorrow to pick it up

## 2015-08-21 NOTE — Telephone Encounter (Signed)
Pt. Called requesting a refill on Tramadol. Please f/u with pt.  °

## 2015-08-21 NOTE — Telephone Encounter (Signed)
Will forward to pcp

## 2015-08-22 ENCOUNTER — Other Ambulatory Visit: Payer: Self-pay | Admitting: Family Medicine

## 2015-08-22 DIAGNOSIS — E119 Type 2 diabetes mellitus without complications: Secondary | ICD-10-CM

## 2015-08-22 MED ORDER — TRUE METRIX METER W/DEVICE KIT: 1.0000 | PACK | Status: DC | PRN

## 2015-08-22 MED ORDER — GLUCOSE BLOOD VI STRP: ORAL_STRIP | Status: DC

## 2015-08-22 MED FILL — TRUE METRIX TEST STRIP: 30 days supply | Qty: 100 | Fill #0

## 2015-08-22 NOTE — Telephone Encounter (Signed)
Patient has strips she would like to be ordered: TrueTest - reordered and sent them to [facility]

## 2015-08-22 NOTE — Telephone Encounter (Signed)
Pt. Came into facility stating that she needs a refill on the true test glucose test strips.  Pt. Stated that her PCP sent glucose blood (ACCU-CHEK SMARTVIEW) test strip  To [facility] pharmacy and those do not fit in her meter. Pt. Would like to know if they can get Changed and would like to pick them up at the [facility] pharmacy.  Please f/u

## 2015-08-23 ENCOUNTER — Encounter: Payer: Self-pay | Admitting: Hematology

## 2015-08-23 NOTE — Progress Notes (Signed)
Per [employer] was shipped 08/23/15

## 2015-09-04 ENCOUNTER — Ambulatory Visit: Payer: Medicare Other | Attending: Family Medicine | Admitting: Family Medicine

## 2015-09-04 ENCOUNTER — Encounter: Payer: Self-pay | Admitting: Family Medicine

## 2015-09-04 VITALS — BP 110/73 | HR 70 | Temp 98.6°F | Resp 17 | Ht 67.0 in | Wt 222.8 lb

## 2015-09-04 DIAGNOSIS — E119 Type 2 diabetes mellitus without complications: Secondary | ICD-10-CM

## 2015-09-04 DIAGNOSIS — R1084 Generalized abdominal pain: Secondary | ICD-10-CM | POA: Diagnosis not present

## 2015-09-04 DIAGNOSIS — Z7984 Long term (current) use of oral hypoglycemic drugs: Secondary | ICD-10-CM | POA: Insufficient documentation

## 2015-09-04 DIAGNOSIS — Z7982 Long term (current) use of aspirin: Secondary | ICD-10-CM | POA: Diagnosis not present

## 2015-09-04 DIAGNOSIS — Z79899 Other long term (current) drug therapy: Secondary | ICD-10-CM | POA: Diagnosis not present

## 2015-09-04 LAB — HEPATIC FUNCTION PANEL
ALT: 29 U/L (ref 6–29)
AST: 26 U/L (ref 10–35)
Albumin: 4.2 g/dL (ref 3.6–5.1)
Alkaline Phosphatase: 50 U/L (ref 33–130)
Bilirubin, Direct: 0.1 mg/dL (ref <=0.2)
Indirect Bilirubin: 0.2 mg/dL (ref 0.2–1.2)
Total Bilirubin: 0.3 mg/dL (ref 0.2–1.2)
Total Protein: 7 g/dL (ref 6.1–8.1)

## 2015-09-04 LAB — POCT URINALYSIS DIPSTICK
Bilirubin, UA: NEGATIVE
Blood, UA: NEGATIVE
Glucose, UA: NEGATIVE
Ketones, UA: NEGATIVE
Nitrite, UA: NEGATIVE
Spec Grav, UA: 1.02
Urobilinogen, UA: 1
pH, UA: 6.5

## 2015-09-04 LAB — LIPASE: Lipase: 33 U/L (ref 7–60)

## 2015-09-04 LAB — GLUCOSE, POCT (MANUAL RESULT ENTRY): POC Glucose: 99 mg/dl (ref 70–99)

## 2015-09-04 LAB — POCT GLYCOSYLATED HEMOGLOBIN (HGB A1C): Hemoglobin A1C: 5.9

## 2015-09-04 MED ORDER — CYCLOBENZAPRINE HCL 10 MG PO TABS: 10.0000 mg | ORAL_TABLET | Freq: Three times a day (TID) | ORAL | 0 refills | Status: DC | PRN

## 2015-09-04 NOTE — Assessment & Plan Note (Signed)
Obese abdomen with generalized tenderness. Pain x 2 months. No fever.   Plan: Lipase, LFTs, ABUS Consider trial off metformin with plan to change to XR if the above evaluation in unrevealing.

## 2015-09-04 NOTE — Progress Notes (Signed)
Patient complains of abdomen pain, right knee pain

## 2015-09-04 NOTE — Patient Instructions (Addendum)
Diagnoses and all orders for this visit:  Type 2 diabetes mellitus without complication, unspecified long term insulin use status (HCC) -     Glucose (CBG) -     HgB A1c  Generalized abdominal pain -     Hepatic Function Panel -     Lipase -     POCT urinalysis dipstick -     US Abdomen Complete; Future -     cyclobenzaprine (FLEXERIL) 10 MG tablet; Take 1 tablet (10 mg total) by mouth 3 (three) times daily as needed for muscle spasms.    Your colonoscopy report is being faxed over  Please f/u in 4 weeks for abdominal pains   Dr. Adrian Blackwater

## 2015-09-04 NOTE — Progress Notes (Signed)
Subjective:  Patient ID: Rachel Herman, female    DOB: 1950-02-08  Age: 65 y.o. MRN: [id number]  CC: No chief complaint on file.   HPI Rachel Herman has diabetes, hx of breast cancer, chronic low back pain she  presents for    1. ED f/u L sided chest wall pain: patient with 2 months of intermittent epigastric pain and bilateral pain. Sharp pains. Pain associated with shortness of breath. No associated fever, no vomiting. There has been some nausea. She went to ED on 08/18/15 for severe episode of pain. Trop neg x 2, EKG normal, CXR normal. CTA negative for PE. She was treated with percocet for MSK pain. She is still having similar pains but not as severe. Pain last 2-3 minutes at time. Occurs once every 2-3 days. When she twist her trunk she also gets similar pains and soreness in her flanks. She has a c-scope with polypectomy on 03/30/15, tubular adenomas and hyperplastic polyps recommended repeat c-scope in 3 years.    Social History  Substance Use Topics  . Smoking status: Never Smoker  . Smokeless tobacco: Never Used  . Alcohol use No    Outpatient Medications Prior to Visit  Medication Sig Dispense Refill  . ACCU-CHEK FASTCLIX LANCETS MISC 1 Units by Percutaneous route 4 (four) times daily. 100 each 12  . Ascorbic Acid (VITAMIN C) 1000 MG tablet Take 1,000 mg by mouth daily.    Marland Kitchen aspirin 325 MG tablet Take 325 mg by mouth once.    . Blood Glucose Monitoring Suppl (TRUE METRIX METER) w/Device KIT 1 each by Does not apply route as needed. 1 kit 0  . cyclobenzaprine (FLEXERIL) 10 MG tablet Take 1 tablet (10 mg total) by mouth 3 (three) times daily as needed for muscle spasms. 30 tablet 0  . exemestane (AROMASIN) 25 MG tablet Take 1 tablet (25 mg total) by mouth daily after breakfast. 30 tablet 3  . gabapentin (NEURONTIN) 300 MG capsule 300 mg during the day, 600 mg at night by mouth (Patient taking differently: Take 300-600 mg by mouth See admin instructions. 300 mg during  the day, 600 mg at night by mouth) 270 capsule 1  . glucose blood test strip Use as instructed 100 each 12  . hydrochlorothiazide (HYDRODIURIL) 25 MG tablet Take 1 tablet (25 mg total) by mouth daily. Pt needs office visit 90 tablet 2  . Lancets (ACCU-CHEK MULTICLIX) lancets 1 each by Other route 3 (three) times daily. ICD 10 E11.9 100 each 12  . lisinopril (PRINIVIL,ZESTRIL) 20 MG tablet Take 1 tablet (20 mg total) by mouth daily. 90 tablet 2  . metFORMIN (GLUCOPHAGE) 500 MG tablet Take 1 tablet (500 mg total) by mouth 2 (two) times daily. 180 tablet 3  . Multiple Vitamins-Minerals (MULTIVITAMIN WITH MINERALS) tablet Take 1 tablet by mouth daily. Reported on 03/13/2015    . traMADol (ULTRAM) 50 MG tablet Take 1 tablet (50 mg total) by mouth every 8 (eight) hours as needed. 90 tablet 2  . vitamin D, CHOLECALCIFEROL, 400 UNITS tablet Take 400 Units by mouth daily.    Marland Kitchen oxyCODONE-acetaminophen (PERCOCET) 5-325 MG tablet Take 1 tablet by mouth every 4 (four) hours as needed. (Patient not taking: Reported on 09/04/2015) 10 tablet 0   No facility-administered medications prior to visit.     ROS Review of Systems  Constitutional: Negative for chills and fever.  Eyes: Negative for visual disturbance.  Respiratory: Negative for shortness of breath.   Cardiovascular: Negative for  chest pain.  Gastrointestinal: Positive for abdominal pain and nausea. Negative for abdominal distention, anal bleeding, blood in stool, constipation, diarrhea, rectal pain and vomiting.  Musculoskeletal: Positive for arthralgias, back pain, myalgias and neck pain.  Skin: Negative for rash.  Allergic/Immunologic: Negative for immunocompromised state.  Neurological: Negative for dizziness.  Hematological: Negative for adenopathy. Does not bruise/bleed easily.  Psychiatric/Behavioral: Negative for dysphoric mood and suicidal ideas.    Objective:  BP 110/73 (BP Location: Left Arm, Patient Position: Sitting, Cuff Size: Large)    Pulse 70   Temp 98.6 F (37 C) (Oral)   Resp 17   Ht _0  (1.702 m)   Wt 222 lb 12.8 oz (101.1 kg)   SpO2 100%   BMI 34.90 kg/m   BP/Weight 09/04/2015 7/42/5956 04/12/7562  Systolic BP 332 951 884  Diastolic BP 73 68 74  Wt. (Lbs) 222.8 - 226.7  BMI 34.9 - 35.5   Physical Exam  Constitutional: She is oriented to person, place, and time. She appears well-developed and well-nourished. No distress.  HENT:  Head: Normocephalic and atraumatic.  Cardiovascular: Normal rate, regular rhythm, normal heart sounds and intact distal pulses.   Pulmonary/Chest: Effort normal and breath sounds normal.  Abdominal: Soft. Bowel sounds are normal. She exhibits no distension and no mass. There is generalized tenderness. There is no rebound and no guarding.  Obese   Musculoskeletal: She exhibits no edema.  Neurological: She is alert and oriented to person, place, and time.  Skin: Skin is warm and dry. No rash noted.  Psychiatric: She has a normal mood and affect.   Lab Results  Component Value Date   HGBA1C 5.9 09/04/2015   CBG 99 UA: wnl   Assessment & Plan:   Diagnoses and all orders for this visit:  Type 2 diabetes mellitus without complication, unspecified long term insulin use status (HCC) -     Glucose (CBG) -     HgB A1c  Generalized abdominal pain -     Hepatic Function Panel -     Lipase -     POCT urinalysis dipstick -     US Abdomen Complete; Future -     cyclobenzaprine (FLEXERIL) 10 MG tablet; Take 1 tablet (10 mg total) by mouth 3 (three) times daily as needed for muscle spasms.    No orders of the defined types were placed in this encounter.   Follow-up: No Follow-up on file.   Boykin Nearing MD

## 2015-09-06 ENCOUNTER — Telehealth: Payer: Self-pay

## 2015-09-06 NOTE — Telephone Encounter (Signed)
Patient was called and informed of results. 

## 2015-09-07 ENCOUNTER — Ambulatory Visit (HOSPITAL_COMMUNITY)
Admission: RE | Admit: 2015-09-07 | Discharge: 2015-09-07 | Disposition: A | Discharge status: Home / Self Care | Payer: Medicare Other | Source: Ambulatory Visit | Attending: Family Medicine | Admitting: Family Medicine

## 2015-09-07 DIAGNOSIS — R1084 Generalized abdominal pain: Secondary | ICD-10-CM | POA: Diagnosis not present

## 2015-09-07 DIAGNOSIS — R932 Abnormal findings on diagnostic imaging of liver and biliary tract: Secondary | ICD-10-CM | POA: Diagnosis not present

## 2015-09-07 DIAGNOSIS — K760 Fatty (change of) liver, not elsewhere classified: Secondary | ICD-10-CM | POA: Diagnosis not present

## 2015-09-11 ENCOUNTER — Encounter (HOSPITAL_COMMUNITY): Payer: Self-pay | Admitting: *Deleted

## 2015-09-11 ENCOUNTER — Telehealth: Payer: Self-pay

## 2015-09-11 ENCOUNTER — Telehealth: Payer: Self-pay | Admitting: Hematology

## 2015-09-11 ENCOUNTER — Ambulatory Visit (HOSPITAL_BASED_OUTPATIENT_CLINIC_OR_DEPARTMENT_OTHER): Payer: Medicare Other | Admitting: Hematology

## 2015-09-11 ENCOUNTER — Other Ambulatory Visit (HOSPITAL_BASED_OUTPATIENT_CLINIC_OR_DEPARTMENT_OTHER): Payer: Medicare Other

## 2015-09-11 VITALS — BP 120/69 | HR 76 | Temp 98.4°F | Resp 18 | Ht 67.0 in | Wt 223.8 lb

## 2015-09-11 DIAGNOSIS — R1013 Epigastric pain: Secondary | ICD-10-CM

## 2015-09-11 DIAGNOSIS — Z6841 Body Mass Index (BMI) 40.0 and over, adult: Secondary | ICD-10-CM

## 2015-09-11 DIAGNOSIS — C50212 Malignant neoplasm of upper-inner quadrant of left female breast: Secondary | ICD-10-CM

## 2015-09-11 DIAGNOSIS — I152 Hypertension secondary to endocrine disorders: Secondary | ICD-10-CM

## 2015-09-11 DIAGNOSIS — Z79811 Long term (current) use of aromatase inhibitors: Secondary | ICD-10-CM | POA: Diagnosis not present

## 2015-09-11 DIAGNOSIS — Z170 Estrogen receptor positive status [ER+]: Secondary | ICD-10-CM

## 2015-09-11 LAB — CBC WITH DIFFERENTIAL/PLATELET
BASO%: 0.5 % (ref 0.0–2.0)
Basophils Absolute: 0 10*3/uL (ref 0.0–0.1)
EOS%: 5.1 % (ref 0.0–7.0)
Eosinophils Absolute: 0.4 10*3/uL (ref 0.0–0.5)
HCT: 38.9 % (ref 34.8–46.6)
HGB: 12.5 g/dL (ref 11.6–15.9)
LYMPH%: 33.2 % (ref 14.0–49.7)
MCH: 28 pg (ref 25.1–34.0)
MCHC: 32.1 g/dL (ref 31.5–36.0)
MCV: 87 fL (ref 79.5–101.0)
MONO#: 0.4 10*3/uL (ref 0.1–0.9)
MONO%: 4.6 % (ref 0.0–14.0)
NEUT#: 4.5 10*3/uL (ref 1.5–6.5)
NEUT%: 56.6 % (ref 38.4–76.8)
Platelets: 250 10*3/uL (ref 145–400)
RBC: 4.47 10*6/uL (ref 3.70–5.45)
RDW: 14 % (ref 11.2–14.5)
WBC: 8 10*3/uL (ref 3.9–10.3)
lymph#: 2.7 10*3/uL (ref 0.9–3.3)

## 2015-09-11 LAB — COMPREHENSIVE METABOLIC PANEL
ALT: 34 U/L (ref 0–55)
AST: 30 U/L (ref 5–34)
Albumin: 3.6 g/dL (ref 3.5–5.0)
Alkaline Phosphatase: 54 U/L (ref 40–150)
Anion Gap: 13 mEq/L — ABNORMAL HIGH (ref 3–11)
BUN: 27.6 mg/dL — ABNORMAL HIGH (ref 7.0–26.0)
CO2: 26 mEq/L (ref 22–29)
Calcium: 10 mg/dL (ref 8.4–10.4)
Chloride: 105 mEq/L (ref 98–109)
Creatinine: 1.3 mg/dL — ABNORMAL HIGH (ref 0.6–1.1)
EGFR: 45 mL/min/{1.73_m2} — ABNORMAL LOW (ref >90)
Glucose: 93 mg/dl (ref 70–140)
Potassium: 4 mEq/L (ref 3.5–5.1)
Sodium: 144 mEq/L (ref 136–145)
Total Bilirubin: 0.32 mg/dL (ref 0.20–1.20)
Total Protein: 7.7 g/dL (ref 6.4–8.3)

## 2015-09-11 MED ORDER — EXEMESTANE 25 MG PO TABS
25.0000 mg | ORAL_TABLET | Freq: Every day | ORAL | 3 refills | Status: DC
Start: 2015-09-11 — End: 2016-09-16

## 2015-09-11 MED ORDER — EXEMESTANE 25 MG PO TABS: 25.0000 mg | ORAL_TABLET | Freq: Every day | ORAL | 5 refills | Status: DC

## 2015-09-11 NOTE — Addendum Note (Signed)
Addended by: Janace Hoard on: 09/11/2015 04:44 PM   Modules accepted: Orders

## 2015-09-11 NOTE — Telephone Encounter (Signed)
Jan,  Could you [facility] and send the prescription to them, 3 month supply with 3 refills, thanks!  Truitt Merle  09/11/2015

## 2015-09-11 NOTE — Telephone Encounter (Signed)
Called pt to let her know the 3 month Rx was sent to [facility].

## 2015-09-11 NOTE — Telephone Encounter (Signed)
Dr Burr Medico asked pt to call us with her mail order pharmacy for her aromasin. Pt gets her aromasin from [facility] Their phone # [phone]. Dr Burr Medico was to see about getting 3 mo supply through this company.

## 2015-09-11 NOTE — Telephone Encounter (Signed)
Gave pt cal & avs °

## 2015-09-11 NOTE — Progress Notes (Signed)
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 Ultrasound of the left axilla is negative.        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      12/29/2013 Surgery    Left lumpectomy with close (<0.1cm) inferior margin     03/02/2014 Surgery    Reexcision for close margin, path negative for malignancy.     04/05/2014 - 05/20/2014 Radiation Therapy    adjuvant breast radiation      05/27/2014 Imaging    Bone density scan: normal      06/07/2015 -  Anti-estrogen oral therapy    Exemestane 25 mg daily     CURRENT THERAPY: Exemestane 2m daily, started on 06/07/2014                                    INTERIM HISTORY: She returns for follow up. She  Is accompanied by her husband to the clinic today. She Is doing moderately well overall. She had an episode of severe epigastric pain and was developed in the emergency room on July 14. She  was seen by her primary care physician afterwards, and have done some work up. She still has intermittent mild epigastric discomfort, which is not related to diet or position. She also still has mild bilateral rib cage discomfort and mild tenderness. No other new pain. She is tolerating exemestane well, denies significant side effects, she has amount of pressure which is manageable.  MEDICAL HISTORY:  Past Medical History:  Diagnosis Date  . Breast cancer ([facility] 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  . Diabetes mellitus ([facility] 09/16/13   Diagnosed on 09/16/13; HgA1C was 7.2/metformin  . Dizziness   . GERD (gastroesophageal reflux disease)   . Headache   . Hyperlipidemia   . Hypertension 2014  . Obesity, morbid, BMI 40.0-49.9 ([facility]   . PONV (postoperative nausea and vomiting)   . S/P radiation therapy 04/05/14-05/20/14   left breast 60.4Gy totaldose    SURGICAL HISTORY: Past Surgical History:  Procedure Laterality Date  . ABDOMINAL HYSTERECTOMY N/A 09/20/2013   Procedure: HYSTERECTOMY ABDOMINAL;  Surgeon: UOsborne Oman MD;  [facility];  Service: Gynecology;  Laterality: N/A;  . BREAST LUMPECTOMY WITH AXILLARY LYMPH NODE BIOPSY Left 12/29/13   left breast   .  LAPAROSCOPIC ASSISTED VAGINAL HYSTERECTOMY  09/20/2013   Procedure: LAPAROSCOPIC ASSISTED VAGINAL HYSTERECTOMY;  Surgeon: Osborne Oman, MD;  Location: [facility];  Service: Gynecology;;  PT WAS EXAMINED WHILE UP IN [place] AND IT WAS DECIDED TO OPEN PT DUE TO LARGE MASS  . LUMBAR LAMINECTOMY/DECOMPRESSION MICRODISCECTOMY Right 12/14/2014   Procedure: Right Lumbar three-four microdiskectomy;  Surgeon: Newman Pies, MD;  Location: [facility];  Service: Neurosurgery;  Laterality: Right;  Right Lumbar three-four microdiskectomy  . RADIOACTIVE SEED GUIDED MASTECTOMY WITH AXILLARY SENTINEL LYMPH NODE BIOPSY Left 12/29/2013   Procedure: LEFT BREAST RADIOACTIVE SEED LOCALIZED LUMPECTOMY WITH SENTINEL LYMPH NODE MAPPING;   Surgeon: Erroll Luna, MD;  Location: [facility];  Service: General;  Laterality: Left;  . RE-EXCISION OF BREAST LUMPECTOMY Left 03/02/2014   Procedure: RE-EXCISION OF LEFT BREAST LUMPECTOMY;  Surgeon: Erroll Luna, MD;  Location: [facility];  Service: General;  Laterality: Left;  . SALPINGOOPHORECTOMY  09/20/2013   Procedure: SALPINGO OOPHORECTOMY;  Surgeon: Osborne Oman, MD;  Location: [facility];  Service: Gynecology;;    SOCIAL HISTORY: Social History   Social History  . Marital status: Married    Spouse name: Lorriann Hansmann   . Number of children: 3  . Years of education: 12   Occupational History  .  Unemployed   Social History Main Topics  . Smoking status: Never Smoker  . Smokeless tobacco: Never Used  . Alcohol use No  . Drug use: No  . Sexual activity: Not Currently    Birth control/ protection: Post-menopausal   Other Topics Concern  . Not on file   Social History Narrative   Married to [employer] in 1986.    Has 3 children from previous marriage, 1 in [place], 1 in [place], 1 in prison ([place]).    Lives with husband.    Right-handed.   1 cup caffeine per day.       FAMILY HISTORY: Family History  Problem Relation Age of Onset  . Diabetes Mother   . Multiple myeloma Mother 10  . Hearing loss Mother   . Diabetes Sister   . Diabetes Brother   . Cancer Brother 40    prostate cancer   . Diabetes Son   . Diabetes Maternal Aunt   . Leukemia Maternal Aunt     dx in her 26s  . Alcoholism Brother   . Heart attack Father     ALLERGIES:  has No Known Allergies.  MEDICATIONS:  Current Outpatient Prescriptions  Medication Sig Dispense Refill  . Ascorbic Acid (VITAMIN C) 1000 MG tablet Take 1,000 mg by mouth daily.    Marland Kitchen aspirin 325 MG tablet Take 325 mg by mouth once.    . Blood Glucose Monitoring Suppl (TRUE METRIX METER) w/Device KIT 1 each by Does not apply route as needed. 1 kit 0  . [facility] SENNA PLUS 8.6-50  MG tablet TAKE 2 TABLETS BY MOUTH AT BEDTIME AS NEEDED FOR MILD CONSTIPATION  11  . cyclobenzaprine (FLEXERIL) 10 MG tablet Take 1 tablet (10 mg total) by mouth 3 (three) times daily as needed for muscle spasms. 30 tablet 0  . exemestane (AROMASIN) 25 MG tablet Take 1 tablet (25 mg total) by mouth daily after breakfast. 30 tablet 3  . gabapentin (NEURONTIN) 300 MG capsule 300 mg during the day, 600 mg at night by mouth (Patient taking differently: Take 300-600 mg by mouth See admin instructions. 300 mg during the day, 600 mg at night by mouth)  270 capsule 1  . glucose blood test strip Use as instructed 100 each 12  . hydrochlorothiazide (HYDRODIURIL) 25 MG tablet Take 1 tablet (25 mg total) by mouth daily. Pt needs office visit 90 tablet 2  . Lancets (ACCU-CHEK MULTICLIX) lancets 1 each by Other route 3 (three) times daily. ICD 10 E11.9 100 each 12  . lisinopril (PRINIVIL,ZESTRIL) 20 MG tablet Take 1 tablet (20 mg total) by mouth daily. 90 tablet 2  . metFORMIN (GLUCOPHAGE) 500 MG tablet Take 1 tablet (500 mg total) by mouth 2 (two) times daily. 180 tablet 3  . Multiple Vitamins-Minerals (MULTIVITAMIN WITH MINERALS) tablet Take 1 tablet by mouth daily. Reported on 03/13/2015    . traMADol (ULTRAM) 50 MG tablet Take 1 tablet (50 mg total) by mouth every 8 (eight) hours as needed. 90 tablet 2  . vitamin D, CHOLECALCIFEROL, 400 UNITS tablet Take 400 Units by mouth daily.     No current facility-administered medications for this visit.     REVIEW OF SYSTEMS:   Constitutional: Denies fevers, chills or abnormal night sweats, (+)  fatigue Eyes: (+) blurriness of vision, no double vision or watery eyes Ears, nose, mouth, throat, and face: Denies mucositis or sore throat Respiratory: (+) dyspnea on exertion, Denies cough or wheezes Cardiovascular: Denies palpitation, chest discomfort or lower extremity swelling Gastrointestinal:  Denies nausea, heartburn or change in bowel habits Skin: Denies abnormal  skin rashes Lymphatics: Denies new lymphadenopathy or easy bruising Neurological:Denies numbness, tingling or new weaknesses MSK: (+)  Bilateral rib cage pain, mid thoracic pain and diffuse arthralgia Behavioral/Psych: Mood is stable, no new changes  All other systems were reviewed with the patient and are negative.  PHYSICAL EXAMINATION: ECOG PERFORMANCE STATUS: 2  BP 120/69 (BP Location: Left Arm, Patient Position: Sitting)   Pulse 76   Temp 98.4 F (36.9 C) (Oral)   Resp 18   Ht _0  (1.702 m)   Wt 223 lb 12.8 oz (101.5 kg)   BMI 35.05 kg/m   GENERAL:alert, no distress and comfortable SKIN: skin color, texture, turgor are normal, no rashes or significant lesions EYES: normal, conjunctiva are pink and non-injected, sclera clear OROPHARYNX:no exudate, no erythema and lips, buccal mucosa, and tongue normal  NECK: supple, thyroid normal size, non-tender, without nodularity LYMPH:  no palpable lymphadenopathy in the cervical, axillary or inguinal LUNGS: clear to auscultation and percussion with normal breathing effort HEART: regular rate & rhythm and no murmurs and no lower extremity edema ABDOMEN:abdomen soft, nontender, no rebound pain normal bowel sounds Musculoskeletal:no cyanosis of digits and no clubbing, (+)  Tenderness at the bilateral low rib cage, in the mid spine. PSYCH: alert & oriented x 3 with fluent speech NEURO: no focal motor/sensory deficits Breasts: Breast inspection showed them to be symmetrical with no nipple discharge. Surgical scar at the left upper breast and axillary area are well healed. Palpation of the right breasts and axilla revealed no obvious mass that I could appreciate.   I have reviewed the data as listed LABORATORY DATA:  CBC Latest Ref Rng & Units 09/11/2015 08/17/2015 06/09/2015  WBC 3.9 - 10.3 10e3/uL 8.0 10.0 7.8  Hemoglobin 11.6 - 15.9 g/dL 12.5 12.6 12.8  Hematocrit 34.8 - 46.6 % 38.9 40.4 40.3  Platelets 145 - 400 10e3/uL 250 296 251     CMP Latest Ref Rng & Units 09/11/2015 09/04/2015 08/17/2015  Glucose 70 - 140 mg/dl 93 - 116(H)  BUN 7.0 - 26.0 mg/dL 27.6(H) - 18  Creatinine 0.6 -  1.1 mg/dL 1.3(H) - 1.15(H)  Sodium 136 - 145 mEq/L 144 - 139  Potassium 3.5 - 5.1 mEq/L 4.0 - 3.8  Chloride 101 - 111 mmol/L - - 102  CO2 22 - 29 mEq/L 26 - 27  Calcium 8.4 - 10.4 mg/dL 10.0 - 9.6  Total Protein 6.4 - 8.3 g/dL 7.7 7.0 -  Total Bilirubin 0.20 - 1.20 mg/dL 0.32 0.3 -  Alkaline Phos 40 - 150 U/L 54 50 -  AST 5 - 34 U/L 30 26 -  ALT 0 - 55 U/L 34 29 -     PATHOLOGY REPORT Diagnosis 12/29/2013 1. Breast, lumpectomy, left - INVASIVE GRADE II DUCTAL CARCINOMA, SPANNING 1.7 CM IN GREATEST DIMENSION. - SECOND FOCUS OF INVASIVE GRADE I DUCTAL CARCINOMA, SPANNING 0.5 CM IN GREATEST DIMENSION. - INTERMEDIATE GRADE DUCTAL CARCINOMA IN SITU ASSOCIATED WITH BOTH FOCI OF TUMOR. - INVASIVE DUCTAL CARCINOMA IS EXTREMELY CLOSE (LESS THAN 0.1 CM) TO INFERIOR MARGIN. - DUCTAL CARCINOMA IN SITU IS CLOSE (0.1 CM) TO INFERIOR MARGIN. - OTHER MARGINS NEGATIVE. - SEE ONCOLOGY TEMPLATE. 2. Lymph node, sentinel, biopsy, Left axillary 1 of 4 FINAL for ARYELLA, BESECKER ([id number]) Diagnosis(continued) - ONE BENIGN LYMPH NODE WITH NO TUMOR SEEN (0/1). 3. Lymph node, sentinel, biopsy, Left axilla - ONE BENIGN LYMPH NODE WITH NO TUMOR SEEN (0/1). Microscopic Comment 1. BREAST, INVASIVE TUMOR, WITH LYMPH NODES PRESENT Specimen, including laterality and lymph node sampling (sentinel, non-sentinel): Left partial breast with left sentinel lymph node sampling. Procedure: Left breast lumpectomy with sentinel lymph node biopsies. Histologic type: Invasive ductal carcinoma. Larger focus: Grade: II. Tubule formation: 2. Nuclear pleomorphism: 2. Mitotic: 3. Smaller focus (near inferior margin): Grade: I. Tubule formation: 1. Nuclear pleomorphism: 2. Mitotic: 1. Tumor sizes (gross and glass slide measurement): 1.7 cm and 0.5  cm. Margins: Invasive, distance to closest margin: less than 0.1 cm (inferior margin). In-situ, distance to closest margin: 0.1 cm (inferior margin). Lymphovascular invasion: Definitive lymphovascular invasion is not identified. Ductal carcinoma in situ: Yes. Grade: Intermediate grade. Extensive intraductal component: There is an extensive intraductal component of ductal carcinoma in situ on the smaller focus but not on the larger focus. Lobular neoplasia: No. Tumor focality: Two foci, multifocal. Treatment effect: N/A. Extent of tumor: Tumor confined to breast parenchyma. Skin: Not received. Nipple: Not received. Skeletal muscle: Not received. Lymph nodes: Examined: 2 Sentinel. 0 Non-sentinel. 2 Total. Lymph nodes with metastasis: 0. Breast prognostic profile: Performed on previous case ([id number]). Estrogen receptor: 100%, positive. Progesterone receptor: 96%, positive. Her 2 neu by CISH: 0.81 ratio, no amplification. Ki-67: 73%. Non-neoplastic breast: Fat necrosis. TNM: pT1c(m), pN0, MX. Comments: A smooth muscle myosin, calponin, and p63 immunohistochemical stain are performed on a single block, which helps to confirm the presence of a second focus of invasive ductal carcinoma associated 2 of 4 FINAL for FIORELA, PELZER ([id number]) Microscopic Comment(continued) with ductal carcinoma in situ. As Her-2 neu by CISH was negative on the initial biopsy, this will be repeated on the larger tumor focus and reported in an addendum to follow. A breast prognostic profile will not be performed on the smaller tumor focus unless otherwise requested (the tumor foci although different grades show morphologically similar nuclear features). (RH:ds 12/31/13)  Oncotype DX Score: 23 (Intermediate risk) (ER+/PR+/HER2-)   Diagnosis 05/09/2015 THYROID, FINE NEEDLE ASPIRATION, RLP (SPECIMEN 1 OF 2 COLLECTED 05-09-2015) CONSISTENT WITH BENIGN FOLLICULAR NODULE (BETHESDA CATEGORY  II).  Diagnosis 05/09/2015 THYROID, FINE NEEDLE ASPIRATION, LMP (SPECIMEN 2 OF 2 COLLECTED 05-09-2015) CONSISTENT WITH  BENIGN FOLLICULAR NODULE (BETHESDA CATEGORY II).   RADIOGRAPHIC STUDIES: I have personally reviewed the radiological images as listed and agreed with the findings in the report.  Mammogram 02/14/2015 IMPRESSION: No findings worrisome for recurrent tumor or developing malignancy.  RECOMMENDATION: Bilateral diagnostic mammography in 1 year.   US SOFT TISSUE HEAD AND NECK 04/03/2015 IMPRESSION: Multi bilateral nodules. Largest on the right measures 19 mm. Largest on left measures 21 mm. Findings meet consensus criteria for biopsy. Ultrasound-guided fine needle aspiration should be considered, as per the consensus statement: Management of Thyroid Nodules Detected at [place]: Society of Radiologists in [place]. Radiology 2005; [id number].  Bone scan 06/20/2015 IMPRESSION: No evidence of metastatic disease. Mild increased activity dorsal aspect right midfoot probable degenerative in nature. Clinical correlation is necessary.   ASSESSMENT & PLAN:  64 year old Caucasian female, with past medical history of hypertension, diabetes, dyslipidemia, obesity who was found to have a left breast mass by screening mammogram.  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 Grade 2, Ki-67 73%, status post lumpectomy with close margin. -She is status post complete surgical resection with lumpectomy. -Her breast cancer is likely cured by surgery alone, but does has some risk of disease recurrence in the future. -I discussed the Oncotype DX result with her and her husband. The recurrence score is 23, with the predicted 10 year recurrence risk of 15% with tamoxifen alone. The benefit of chemotherapy is uncertain in this intermediate risk group. I do not strongly recommend adjuvant chemotherapy. Patient  agree with not having chemotherapy.  -Due to the strong positivity of ER and PR, I recommend adjuvant  Aromasin for total 5 years -She has been tolerating aromasin well, will continue for a total of 5 years  - due to the new onset bilateral rib pain and midthoracic pain, I ordered a bone scan which was negative for bone metastasis. - lab reviewed with her, CBC and CMP are unremarkable. Her exam today was normal.  Her last mammogram in 02/2015 was normal. No evidence of recurrence. - she will continue  Surveillance - I encouraged her to eat healthy, try to be more physically active, and try to lose some weight.  #2 Bone health -Her recent bone density scan was normal in 05/2014 -We discussed Aromasin may weak her bone, she will continue calcium and vitamin D.  #3. hypertension, diabetes, dyslipidemia, chronic low back pain -She will continue follow-up with her primary care physician. -she has recovered well from her back surgery , although she may need another one sometime this year  #4 Morbid obesity -We discussed healthy diet and regular exercise. She is willing to try after she recovers well from her back surgery  #5 new right lung nodule -She had a CT abdomen and pelvis in June 2016, which incidentally found a 4 mm nodule in the right lower lobe. I personally reviewed the image.  -She never smoked, risk of lung cancer is low  -her repeated CT chest from 12/09/2014, which showed stable small lung nodules, likely benign. -We'll continue monitoring, consider repeating CT scan in 6-12 months.  #6. Thyroid  Benign follicular nodule -Her CT scan revealed a 1.7 cm right thyroid nodule. - ultrasound guided thyroid nodule biopsy showed benign follicular nodule   #7 epigastric pain -Uncertain etiology. She has been following up with her primary care physician -If she has persistent symptoms, I may take her off exemestane for a few months to see if  her symptom resolves.  PLAN: -continue  exemastane. If her epigastric pain persists and other workup negative, I may take her off exemestane for a few months to see if her symptoms results. She will call me if needed --Return to clinic in 4 months with lab,  Soon if needed.   All questions were answered. The patient knows to call the clinic with any problems, questions or concerns. I spent 30 minutes counseling the patient face to face. The total time spent in the appointment was 35 minutes and more than 50% was on counseling.     Truitt Merle, MD 09/11/2015 1:43 PM

## 2015-09-12 ENCOUNTER — Encounter: Payer: Self-pay | Admitting: Hematology

## 2015-09-13 ENCOUNTER — Telehealth: Payer: Self-pay

## 2015-09-13 NOTE — Telephone Encounter (Signed)
Patient was informed of results 

## 2015-09-17 IMAGING — DX DG ABDOMEN 2V
2 series · 2 of 2 positions shown · non-contrast
Comparison: CT scan of the abdomen and pelvis of August 10, 2013

CLINICAL DATA: Three weeks of periumbilical abdominal pain, also
abdominal distension ; morbid obesity.

EXAM:
ABDOMEN - 2 VIEW

[abdomen erect]
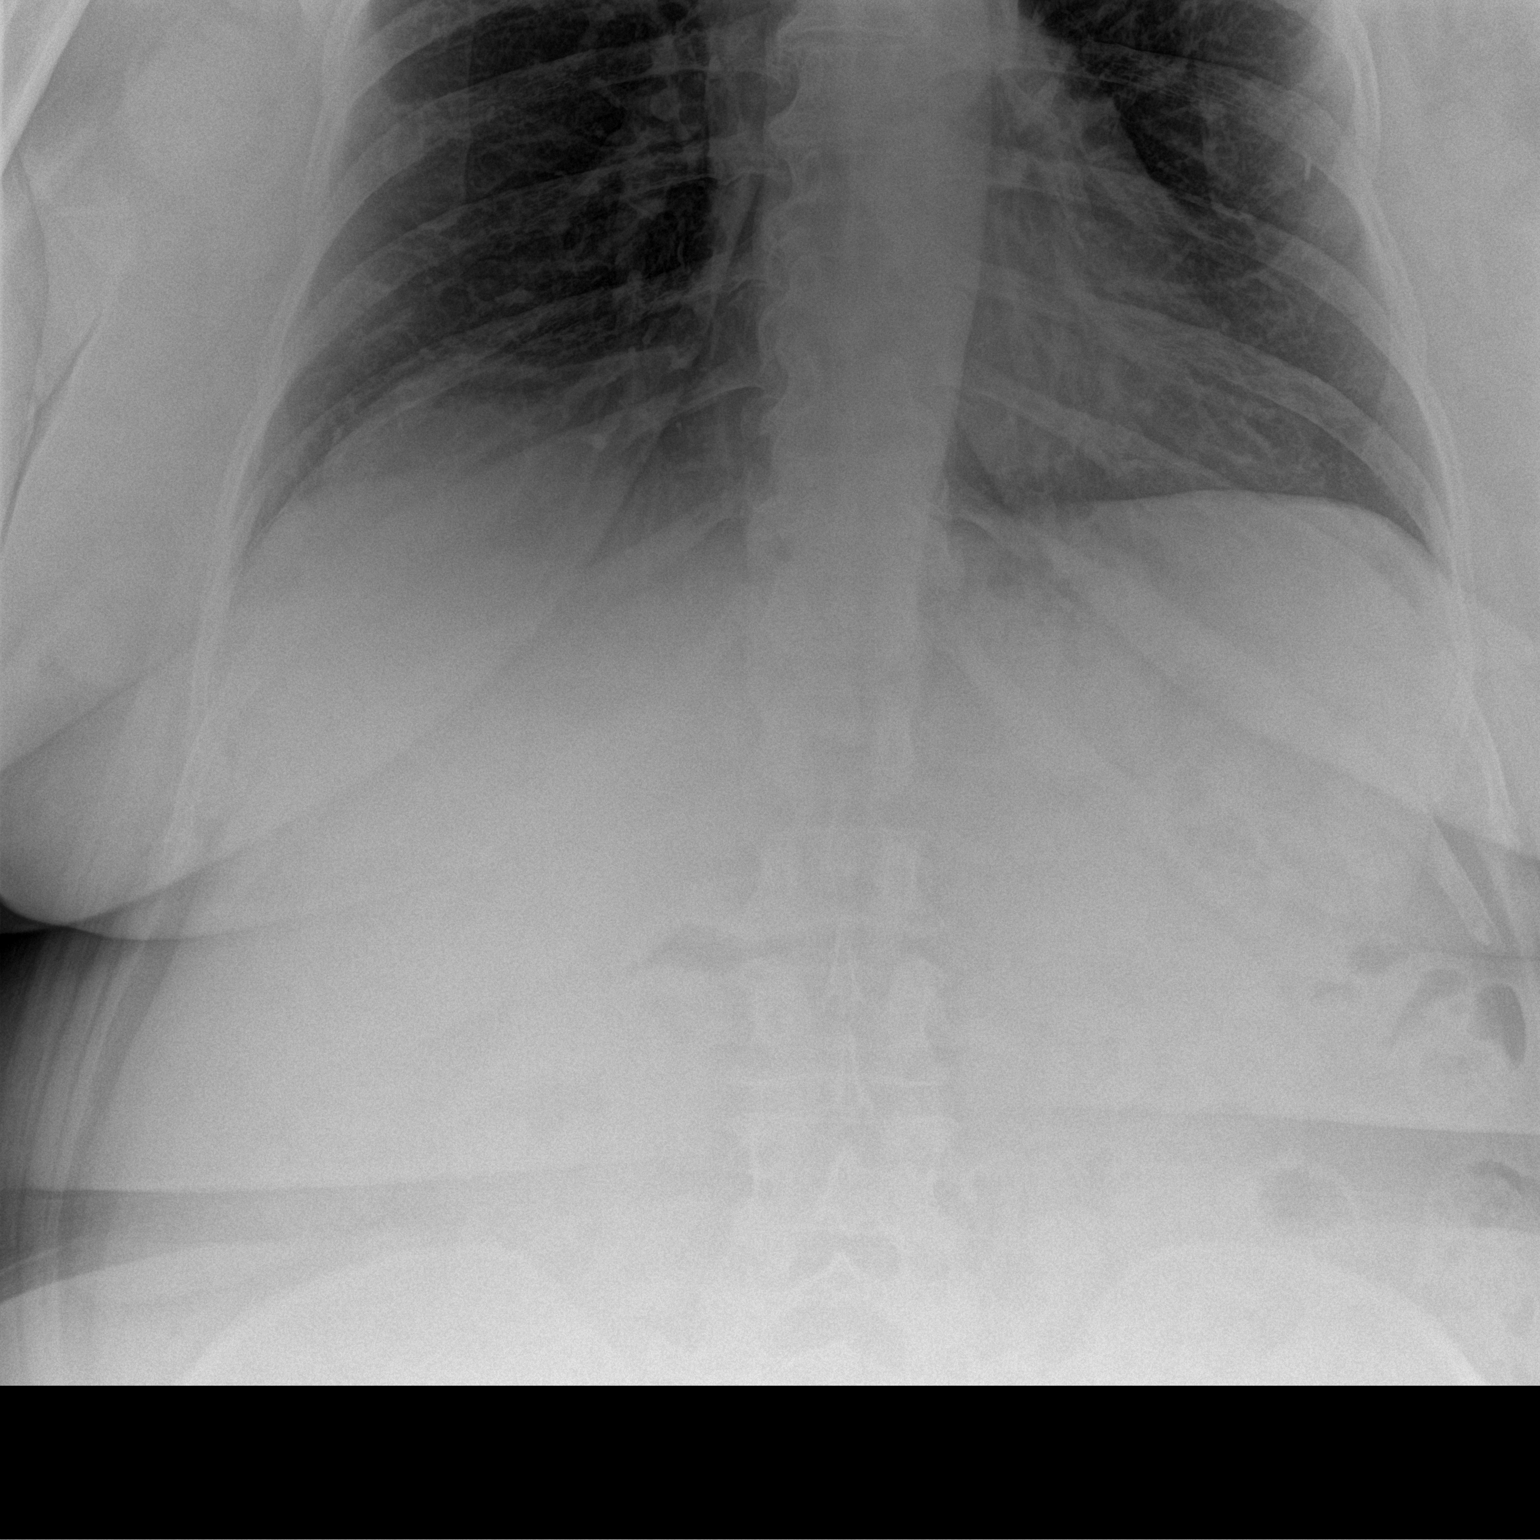

[abdomen supine]
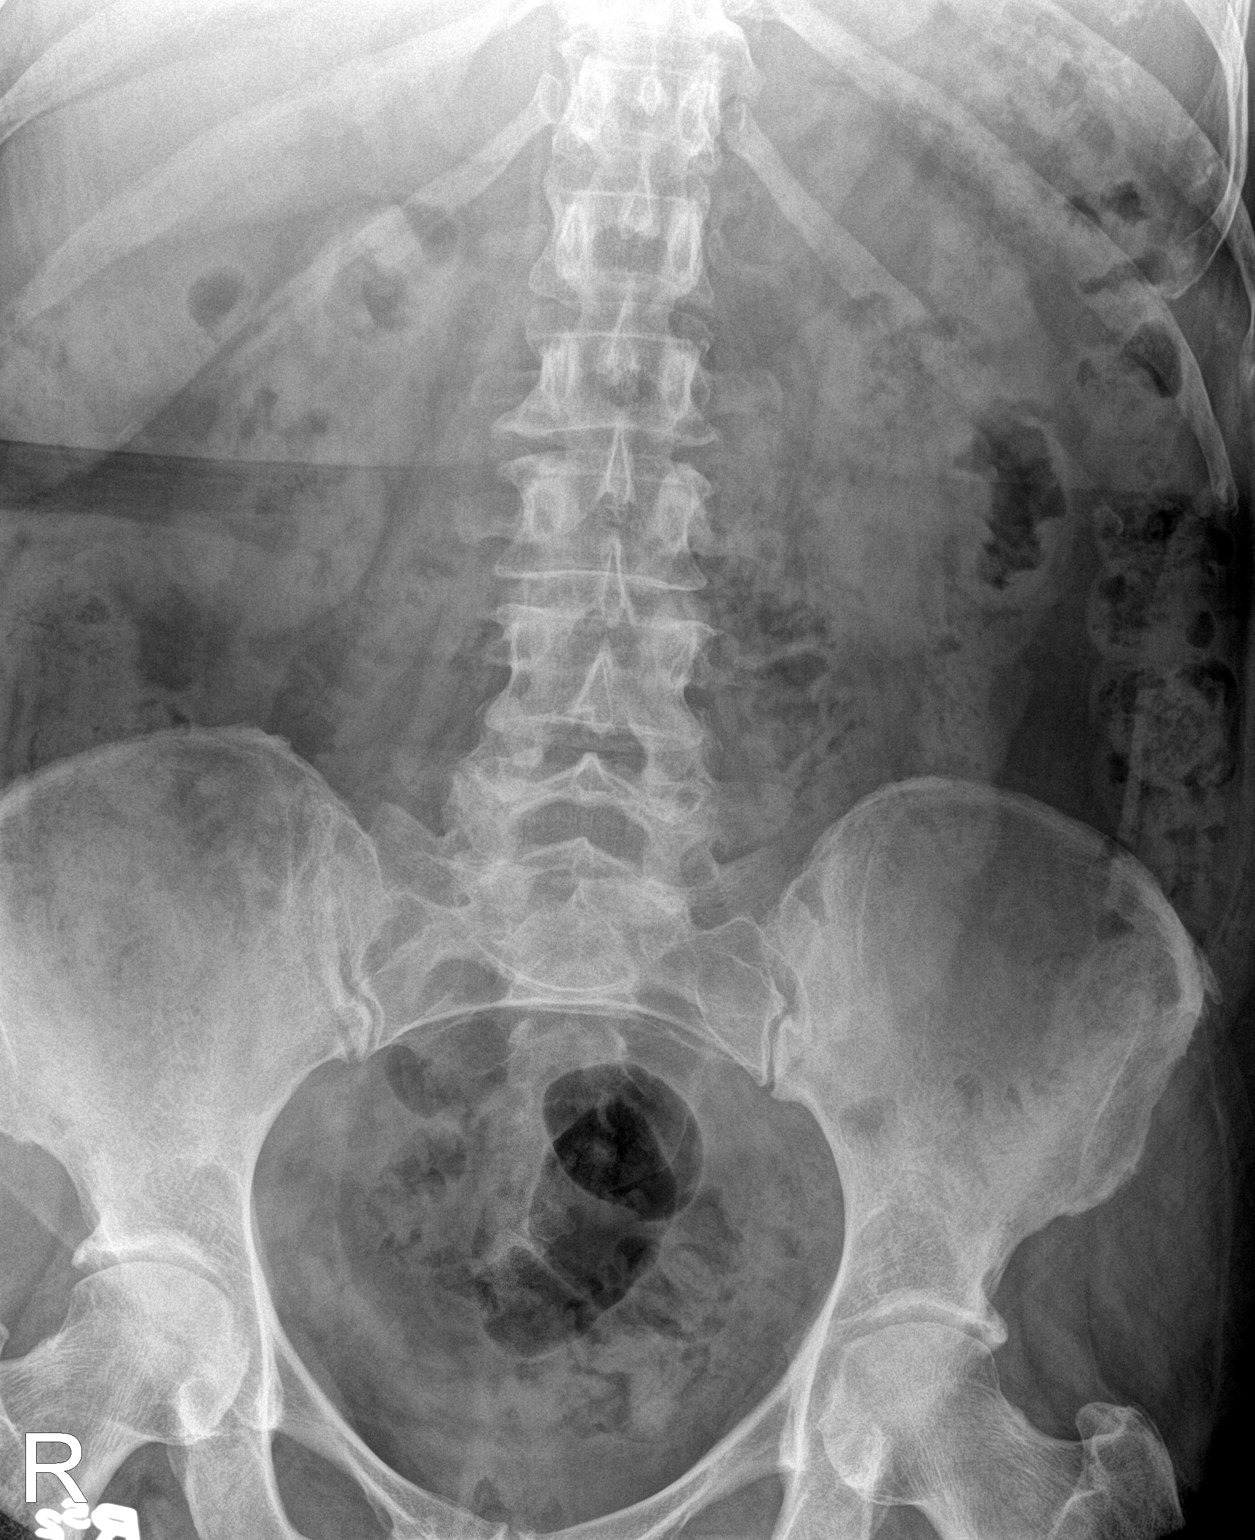

[2 of 2 positions shown; findings below may reference images not displayed]

FINDINGS: There is increased stool burden within the colon. There is no large
or small bowel obstruction. There are no abnormal soft tissue
calcifications. The bony structures exhibit no acute abnormalities.
There are mild degenerative changes of the right hip. The lung bases
are clear.
IMPRESSION: There is no acute intra-abdominal abnormality. Increased colonic
stool burden may reflect constipation in the appropriate clinical
setting.

## 2015-09-18 ENCOUNTER — Encounter: Payer: Self-pay | Admitting: Family Medicine

## 2015-10-06 DIAGNOSIS — Z853 Personal history of malignant neoplasm of breast: Secondary | ICD-10-CM | POA: Diagnosis not present

## 2015-10-10 ENCOUNTER — Ambulatory Visit: Payer: Medicare Other | Attending: Family Medicine | Admitting: Family Medicine

## 2015-10-10 ENCOUNTER — Encounter: Payer: Self-pay | Admitting: Family Medicine

## 2015-10-10 VITALS — BP 119/82 | HR 85 | Temp 98.5°F | Wt 219.8 lb

## 2015-10-10 DIAGNOSIS — Z79899 Other long term (current) drug therapy: Secondary | ICD-10-CM | POA: Insufficient documentation

## 2015-10-10 DIAGNOSIS — H2513 Age-related nuclear cataract, bilateral: Secondary | ICD-10-CM | POA: Diagnosis not present

## 2015-10-10 DIAGNOSIS — M48062 Spinal stenosis, lumbar region with neurogenic claudication: Secondary | ICD-10-CM

## 2015-10-10 DIAGNOSIS — M4806 Spinal stenosis, lumbar region: Secondary | ICD-10-CM

## 2015-10-10 DIAGNOSIS — Z7982 Long term (current) use of aspirin: Secondary | ICD-10-CM | POA: Diagnosis not present

## 2015-10-10 DIAGNOSIS — M4726 Other spondylosis with radiculopathy, lumbar region: Secondary | ICD-10-CM | POA: Diagnosis not present

## 2015-10-10 DIAGNOSIS — E119 Type 2 diabetes mellitus without complications: Secondary | ICD-10-CM | POA: Insufficient documentation

## 2015-10-10 DIAGNOSIS — R059 Cough, unspecified: Secondary | ICD-10-CM

## 2015-10-10 DIAGNOSIS — R05 Cough: Secondary | ICD-10-CM | POA: Diagnosis not present

## 2015-10-10 DIAGNOSIS — H40013 Open angle with borderline findings, low risk, bilateral: Secondary | ICD-10-CM | POA: Diagnosis not present

## 2015-10-10 DIAGNOSIS — R1084 Generalized abdominal pain: Secondary | ICD-10-CM | POA: Insufficient documentation

## 2015-10-10 DIAGNOSIS — Z9889 Other specified postprocedural states: Secondary | ICD-10-CM | POA: Insufficient documentation

## 2015-10-10 LAB — GLUCOSE, POCT (MANUAL RESULT ENTRY): POC Glucose: 110 mg/dl — AB (ref 70–99)

## 2015-10-10 MED ORDER — GUAIFENESIN-CODEINE 100-10 MG/5ML PO SYRP: 5.0000 mL | ORAL_SOLUTION | Freq: Three times a day (TID) | ORAL | 0 refills | Status: DC | PRN

## 2015-10-10 MED ORDER — RANITIDINE HCL 150 MG PO TABS: 150.0000 mg | ORAL_TABLET | Freq: Two times a day (BID) | ORAL | 2 refills | Status: DC

## 2015-10-10 MED ORDER — TRUE METRIX METER W/DEVICE KIT: 1.0000 | PACK | 0 refills | Status: DC | PRN

## 2015-10-10 MED FILL — !TRUE METRIX BLOOD GLUCOSE: 1 days supply | Qty: 1 | Fill #0

## 2015-10-10 NOTE — Assessment & Plan Note (Signed)
Abdominal pain of unknown etiology, Possible referred back from back  Plan: Zantac

## 2015-10-10 NOTE — Patient Instructions (Addendum)
Lyndsey was seen today for abdominal pain.  Diagnoses and all orders for this visit:  Type 2 diabetes mellitus without complication, unspecified long term insulin use status (HCC) -     POCT glucose (manual entry) -     Blood Glucose Monitoring Suppl (TRUE METRIX METER) w/Device KIT; 1 each by Does not apply route as needed.  Cough -     guaiFENesin-codeine (CHERATUSSIN AC) 100-10 MG/5ML syrup; Take 5 mLs by mouth 3 (three) times daily as needed for cough.  Generalized abdominal pain -     ranitidine (ZANTAC) 150 MG tablet; Take 1 tablet (150 mg total) by mouth 2 (two) times daily.  Osteoarthritis of spine with radiculopathy, lumbar region -     Ambulatory referral to Neurology  Lumbar stenosis with neurogenic claudication -     Ambulatory referral to Neurology   F/u in 6 weeks for stomach pains   Dr. Adrian Blackwater

## 2015-10-10 NOTE — Progress Notes (Signed)
Subjective:  Patient ID: Rachel Herman, female    DOB: January 24, 1951  Age: 65 y.o. MRN: [id number]  CC: Abdominal Pain   HPI Rachel Herman has diabetes, hx of breast cancer, chronic low back pain she  presents for    1. Abdominal pain and back pain:  patient with 5 months of intermittent epigastric pain and bilateral pain. Sharp pains. Pain associated with L sided and low back pains.  No associated fever, no vomiting. There is  nausea. She went to ED on 08/18/15 for severe episode of pain. Trop neg x 2, EKG normal, CXR normal. CTA negative for PE. CT abdomen and pelvis negative on 07/20/15. She is still having similar pains but not as severe. Pain last 2-3 minutes at time. Occurs once every 2-3 days. When she twist her trunk she also gets similar pains and soreness in her flanks. She has a c-scope with polypectomy on 03/30/15, tubular adenomas and hyperplastic polyps recommended repeat c-scope in 3 years.   2. Chronic back pain: she is having low back and L sided back pain that is worsening. She is s/p lumbar laminectomy/decompression in 12/2014.   Past Surgical History:  Procedure Laterality Date  . ABDOMINAL HYSTERECTOMY N/A 09/20/2013   Procedure: HYSTERECTOMY ABDOMINAL;  Surgeon: Osborne Oman, MD;  Location: [facility];  Service: Gynecology;  Laterality: N/A;  . BREAST LUMPECTOMY WITH AXILLARY LYMPH NODE BIOPSY Left 12/29/13   left breast   . LAPAROSCOPIC ASSISTED VAGINAL HYSTERECTOMY  09/20/2013   Procedure: LAPAROSCOPIC ASSISTED VAGINAL HYSTERECTOMY;  Surgeon: Osborne Oman, MD;  Location: [facility];  Service: Gynecology;;  PT WAS EXAMINED WHILE UP IN STIRRUPS AND IT WAS DECIDED TO OPEN PT DUE TO LARGE MASS  . LUMBAR LAMINECTOMY/DECOMPRESSION MICRODISCECTOMY Right 12/14/2014   Procedure: Right Lumbar three-four microdiskectomy;  Surgeon: Newman Pies, MD;  Location: [facility];  Service: Neurosurgery;  Laterality: Right;  Right Lumbar three-four microdiskectomy  . RADIOACTIVE  SEED GUIDED MASTECTOMY WITH AXILLARY SENTINEL LYMPH NODE BIOPSY Left 12/29/2013   Procedure: LEFT BREAST RADIOACTIVE SEED LOCALIZED LUMPECTOMY WITH SENTINEL LYMPH NODE MAPPING;  Surgeon: Erroll Luna, MD;  Location: [facility];  Service: General;  Laterality: Left;  . RE-EXCISION OF BREAST LUMPECTOMY Left 03/02/2014   Procedure: RE-EXCISION OF LEFT BREAST LUMPECTOMY;  Surgeon: Erroll Luna, MD;  Location: [facility];  Service: General;  Laterality: Left;  . SALPINGOOPHORECTOMY  09/20/2013   Procedure: SALPINGO OOPHORECTOMY;  Surgeon: Osborne Oman, MD;  Location: [facility];  Service: Gynecology;;   Social History  Substance Use Topics  . Smoking status: Never Smoker  . Smokeless tobacco: Never Used  . Alcohol use No    Outpatient Medications Prior to Visit  Medication Sig Dispense Refill  . Ascorbic Acid (VITAMIN C) 1000 MG tablet Take 1,000 mg by mouth daily.    Marland Kitchen aspirin 325 MG tablet Take 325 mg by mouth once.    . Blood Glucose Monitoring Suppl (TRUE METRIX METER) w/Device KIT 1 each by Does not apply route as needed. 1 kit 0  . [facility] SENNA PLUS 8.6-50 MG tablet TAKE 2 TABLETS BY MOUTH AT BEDTIME AS NEEDED FOR MILD CONSTIPATION  11  . cyclobenzaprine (FLEXERIL) 10 MG tablet Take 1 tablet (10 mg total) by mouth 3 (three) times daily as needed for muscle spasms. 30 tablet 0  . exemestane (AROMASIN) 25 MG tablet Take 1 tablet (25 mg total) by mouth daily after breakfast. 90 tablet 3  . gabapentin (NEURONTIN) 300  MG capsule 300 mg during the day, 600 mg at night by mouth (Patient taking differently: Take 300-600 mg by mouth See admin instructions. 300 mg during the day, 600 mg at night by mouth) 270 capsule 1  . glucose blood test strip Use as instructed 100 each 12  . hydrochlorothiazide (HYDRODIURIL) 25 MG tablet Take 1 tablet (25 mg total) by mouth daily. Pt needs office visit 90 tablet 2  . Lancets (ACCU-CHEK MULTICLIX) lancets 1 each by Other route 3  (three) times daily. ICD 10 E11.9 100 each 12  . lisinopril (PRINIVIL,ZESTRIL) 20 MG tablet Take 1 tablet (20 mg total) by mouth daily. 90 tablet 2  . metFORMIN (GLUCOPHAGE) 500 MG tablet Take 1 tablet (500 mg total) by mouth 2 (two) times daily. 180 tablet 3  . Multiple Vitamins-Minerals (MULTIVITAMIN WITH MINERALS) tablet Take 1 tablet by mouth daily. Reported on 03/13/2015    . traMADol (ULTRAM) 50 MG tablet Take 1 tablet (50 mg total) by mouth every 8 (eight) hours as needed. 90 tablet 2  . vitamin D, CHOLECALCIFEROL, 400 UNITS tablet Take 400 Units by mouth daily.     No facility-administered medications prior to visit.     ROS Review of Systems  Constitutional: Negative for chills and fever.  Eyes: Negative for visual disturbance.  Respiratory: Positive for cough. Negative for shortness of breath.   Cardiovascular: Negative for chest pain.  Gastrointestinal: Positive for abdominal pain and nausea. Negative for abdominal distention, anal bleeding, blood in stool, constipation, diarrhea, rectal pain and vomiting.  Musculoskeletal: Positive for arthralgias, back pain, myalgias and neck pain.  Skin: Negative for rash.  Allergic/Immunologic: Negative for immunocompromised state.  Neurological: Negative for dizziness.  Hematological: Negative for adenopathy. Does not bruise/bleed easily.  Psychiatric/Behavioral: Negative for dysphoric mood and suicidal ideas.    Objective:  BP 119/82 (BP Location: Right Arm, Patient Position: Sitting, Cuff Size: Large)   Pulse 85   Temp 98.5 F (36.9 C) (Oral)   Wt 219 lb 12.8 oz (99.7 kg)   SpO2 93%   BMI 34.43 kg/m   BP/Weight 10/10/2015 09/11/2015 0/26/3785  Systolic BP 885 027 741  Diastolic BP 82 69 73  Wt. (Lbs) 219.8 223.8 222.8  BMI 34.43 35.05 34.9   Physical Exam  Constitutional: She is oriented to person, place, and time. She appears well-developed and well-nourished. No distress.  HENT:  Head: Normocephalic and atraumatic.    Cardiovascular: Normal rate, regular rhythm, normal heart sounds and intact distal pulses.   Pulmonary/Chest: Effort normal and breath sounds normal.  Abdominal: Soft. Bowel sounds are normal. She exhibits no distension and no mass. There is generalized tenderness. There is no rebound and no guarding.  Obese   Musculoskeletal: She exhibits no edema.       Lumbar back: She exhibits decreased range of motion and tenderness. She exhibits no bony tenderness, no swelling, no edema, no deformity, no laceration, no pain, no spasm and normal pulse.  Neurological: She is alert and oriented to person, place, and time.  Skin: Skin is warm and dry. No rash noted.  Psychiatric: She has a normal mood and affect.   Lab Results  Component Value Date   HGBA1C 5.9 09/04/2015   CBG 110  Assessment & Plan:   Rachel Herman was seen today for abdominal pain.  Diagnoses and all orders for this visit:  Type 2 diabetes mellitus without complication, unspecified long term insulin use status (HCC) -     POCT glucose (manual entry) -  Blood Glucose Monitoring Suppl (TRUE METRIX METER) w/Device KIT; 1 each by Does not apply route as needed.  Cough -     guaiFENesin-codeine (CHERATUSSIN AC) 100-10 MG/5ML syrup; Take 5 mLs by mouth 3 (three) times daily as needed for cough.  Generalized abdominal pain -     ranitidine (ZANTAC) 150 MG tablet; Take 1 tablet (150 mg total) by mouth 2 (two) times daily.  Osteoarthritis of spine with radiculopathy, lumbar region -     Ambulatory referral to Neurology  Lumbar stenosis with neurogenic claudication -     Ambulatory referral to Neurology    No orders of the defined types were placed in this encounter.   Follow-up: No Follow-up on file.   Boykin Nearing MD

## 2015-10-10 NOTE — Assessment & Plan Note (Signed)
Worsening back pain Referred back to neurology

## 2015-10-31 ENCOUNTER — Ambulatory Visit (INDEPENDENT_AMBULATORY_CARE_PROVIDER_SITE_OTHER): Payer: Medicare Other | Admitting: Neurology

## 2015-10-31 ENCOUNTER — Encounter: Payer: Self-pay | Admitting: Neurology

## 2015-10-31 VITALS — BP 119/72 | HR 76 | Ht 67.0 in | Wt 218.8 lb

## 2015-10-31 DIAGNOSIS — M5416 Radiculopathy, lumbar region: Secondary | ICD-10-CM | POA: Diagnosis not present

## 2015-10-31 DIAGNOSIS — M542 Cervicalgia: Secondary | ICD-10-CM | POA: Diagnosis not present

## 2015-10-31 DIAGNOSIS — M48062 Spinal stenosis, lumbar region with neurogenic claudication: Secondary | ICD-10-CM

## 2015-10-31 DIAGNOSIS — M4806 Spinal stenosis, lumbar region: Secondary | ICD-10-CM

## 2015-10-31 MED ORDER — HYDROCODONE-ACETAMINOPHEN 5-325 MG PO TABS: 1.0000 | ORAL_TABLET | Freq: Four times a day (QID) | ORAL | 0 refills | Status: DC | PRN

## 2015-10-31 NOTE — Progress Notes (Signed)
Chief Complaint  Patient presents with  . Lumbar Stenosis    She is here for worsening of her low back pain.  States pain is radiating into her left leg down to her toes.  Prolonged sitting causes her the most discomfort.  She also has difficulty bending over to work in her garden.   Chief Complaint  Patient presents with  . Lumbar Stenosis    She is here for worsening of her low back pain.  States pain is radiating into her left leg down to her toes.  Prolonged sitting causes her the most discomfort.  She also has difficulty bending over to work in her garden.      PATIENT: Rachel Herman DOB: August 09, 1950  Chief Complaint  Patient presents with  . Lumbar Stenosis    She is here for worsening of her low back pain.  States pain is radiating into her left leg down to her toes.  Prolonged sitting causes her the most discomfort.  She also has difficulty bending over to work in her garden.     HISTORICAL  Rachel Herman is a 65 years old right-handed female, seen in refer by  her primary care physician Boykin Nearing, MD for evaluation of dizziness  She has history of left breast cancer, status post left lobectomy followed by radiation, hypertension, diabetes  She presented with dizziness in July 15 2014, at nighttime, she turned and lie down at her right side, noticed acute onset of vertigo, nausea, unsteady gait, she has to be helped by her husband to get up, symptoms gradually improved up she lied down still,  But since the initial event, she has recurrent episode of transient dizziness, especially when she lying to her right side, improved if she lies on her left side, also triggered by sudden positional change. She denied hearing loss, no tinnitus.  She denies significant gait difficulty  She has tried repositional maneuver at home, which has helped her, right side first. We have personally reviewed MRI brain in September 2016 that was normal. This is most consistent with a  benign positional vertigo    She complains of over one year history of severe right-sided low back pain, radiating pain to her right hip, right lower extremity, worsening constipation, 10 out of 10 sometimes, she has tried Ibuprofen, could not tolerate due to significant GI side effect, she been taking frequent Tylenol, tramadol, Neurontin 300 mg 4 tablets daily provides limited help  Electrodiagnostic study today confirmed mild bilateral lumbar radiculopathy, right worse than left,  MRI lumbar in October 2016: Right paracentral disc herniation at L3-L4 superimposed on facet hypertrophy with severe right lateral recess stenosis (descending right L4 nerve root level) and moderate to severe overall spinal stenosis. Mild for age lumbar spine degeneration elsewhere.  She had right L3-4 laminotomy and foraminotomy to decompress the right L4 nerve root using micro-dissection in December 14 2014 by Dr. Arnoldo Morale, her right low back pain has much improved, she is still taking tramadol as needed. She still has mid low back pain. Now she has left hip pain.   UPDATE Sept 26th 2017: She had lumbar decompression surgery right L3-4 laminotomy and foraminotomy by Dr. Arnoldo Morale on December 14 2014, which did help her low back pain and radiating pain to right leg, today she come in  complains of left-sided low back pain radiating pain to left lower extremity, left lateral leg paresthesia since March 2017, she has been taking gabapentin 300 mg 1 in the morning  2 at night, tramadol as needed with limited help, she also noticed unsteady gait, worsening urinary urgency   REVIEW OF SYSTEMS: Full 14 system review of systems performed and notable only for as above  ALLERGIES: No Known Allergies  HOME MEDICATIONS: Current Outpatient Prescriptions  Medication Sig Dispense Refill  . Ascorbic Acid (VITAMIN C) 1000 MG tablet Take 1,000 mg by mouth daily.    Marland Kitchen aspirin 325 MG tablet Take 325 mg by mouth once.    . Blood Glucose  Monitoring Suppl (TRUE METRIX METER) w/Device KIT 1 each by Does not apply route as needed. 1 kit 0  . CVS SENNA PLUS 8.6-50 MG tablet TAKE 2 TABLETS BY MOUTH AT BEDTIME AS NEEDED FOR MILD CONSTIPATION  11  . cyclobenzaprine (FLEXERIL) 10 MG tablet Take 1 tablet (10 mg total) by mouth 3 (three) times daily as needed for muscle spasms. 30 tablet 0  . exemestane (AROMASIN) 25 MG tablet Take 1 tablet (25 mg total) by mouth daily after breakfast. 90 tablet 3  . gabapentin (NEURONTIN) 300 MG capsule 300 mg during the day, 600 mg at night by mouth (Patient taking differently: Take 300-600 mg by mouth See admin instructions. 300 mg during the day, 600 mg at night by mouth) 270 capsule 1  . glucose blood test strip Use as instructed 100 each 12  . guaiFENesin-codeine (CHERATUSSIN AC) 100-10 MG/5ML syrup Take 5 mLs by mouth 3 (three) times daily as needed for cough. 120 mL 0  . hydrochlorothiazide (HYDRODIURIL) 25 MG tablet Take 1 tablet (25 mg total) by mouth daily. Pt needs office visit 90 tablet 2  . Lancets (ACCU-CHEK MULTICLIX) lancets 1 each by Other route 3 (three) times daily. ICD 10 E11.9 100 each 12  . lisinopril (PRINIVIL,ZESTRIL) 20 MG tablet Take 1 tablet (20 mg total) by mouth daily. 90 tablet 2  . metFORMIN (GLUCOPHAGE) 500 MG tablet Take 1 tablet (500 mg total) by mouth 2 (two) times daily. 180 tablet 3  . Multiple Vitamins-Minerals (MULTIVITAMIN WITH MINERALS) tablet Take 1 tablet by mouth daily. Reported on 03/13/2015    . ranitidine (ZANTAC) 150 MG tablet Take 1 tablet (150 mg total) by mouth 2 (two) times daily. 60 tablet 2  . traMADol (ULTRAM) 50 MG tablet Take 1 tablet (50 mg total) by mouth every 8 (eight) hours as needed. 90 tablet 2  . vitamin D, CHOLECALCIFEROL, 400 UNITS tablet Take 400 Units by mouth daily.     No current facility-administered medications for this visit.     PAST MEDICAL HISTORY: Past Medical History:  Diagnosis Date  . Breast cancer ([place]) 12/09/13   left  breast Invasive Ductal Carcinoma  . Diabetes mellitus ([facility] [place]) 09/16/13   Diagnosed on 09/16/13; HgA1C was 7.2/metformin  . Dizziness   . GERD (gastroesophageal reflux disease)   . Headache   . Hyperlipidemia   . Hypertension 2014  . Low back pain   . Obesity, morbid, BMI 40.0-49.9 ([place])   . PONV (postoperative nausea and vomiting)   . S/P radiation therapy 04/05/14-05/20/14   left breast 60.4Gy totaldose    PAST SURGICAL HISTORY: Past Surgical History:  Procedure Laterality Date  . ABDOMINAL HYSTERECTOMY N/A 09/20/2013   Procedure: HYSTERECTOMY ABDOMINAL;  Surgeon: Osborne Oman, MD;  Location: [facility];  Service: Gynecology;  Laterality: N/A;  . BREAST LUMPECTOMY WITH AXILLARY LYMPH NODE BIOPSY Left 12/29/13   left breast   . LAPAROSCOPIC ASSISTED VAGINAL HYSTERECTOMY  09/20/2013   Procedure: LAPAROSCOPIC ASSISTED VAGINAL HYSTERECTOMY;  Surgeon: Ugonna A Anyanwu, MD;  Location: [facility];  Service: Gynecology;;  PT WAS EXAMINED WHILE UP IN STIRRUPS AND IT WAS DECIDED TO OPEN PT DUE TO LARGE MASS  . LUMBAR LAMINECTOMY/DECOMPRESSION MICRODISCECTOMY Right 12/14/2014   Procedure: Right Lumbar three-four microdiskectomy;  Surgeon: Jeffrey Jenkins, MD;  Location: [facility];  Service: Neurosurgery;  Laterality: Right;  Right Lumbar three-four microdiskectomy  . RADIOACTIVE SEED GUIDED MASTECTOMY WITH AXILLARY SENTINEL LYMPH NODE BIOPSY Left 12/29/2013   Procedure: LEFT BREAST RADIOACTIVE SEED LOCALIZED LUMPECTOMY WITH SENTINEL LYMPH NODE MAPPING;  Surgeon: Thomas Cornett, MD;  Location: [facility];  Service: General;  Laterality: Left;  . RE-EXCISION OF BREAST LUMPECTOMY Left 03/02/2014   Procedure: RE-EXCISION OF LEFT BREAST LUMPECTOMY;  Surgeon: Thomas Cornett, MD;  Location: [facility];  Service: General;  Laterality: Left;  . SALPINGOOPHORECTOMY  09/20/2013   Procedure: SALPINGO OOPHORECTOMY;  Surgeon: Ugonna A Anyanwu, MD;  Location: [facility];  Service: Gynecology;;     FAMILY HISTORY: Family History  Problem Relation Age of Onset  . Diabetes Mother   . Multiple myeloma Mother 72  . Hearing loss Mother   . Diabetes Brother   . Cancer Brother 40    prostate cancer   . Diabetes Maternal Aunt   . Leukemia Maternal Aunt     dx in her 80s  . Alcoholism Brother   . Heart attack Father   . Diabetes Sister   . Diabetes Son     SOCIAL HISTORY:  Social History   Social History  . Marital status: Married    Spouse name: Larry Hobday   . Number of children: 3  . Years of education: 12   Occupational History  .  Unemployed   Social History Main Topics  . Smoking status: Never Smoker  . Smokeless tobacco: Never Used  . Alcohol use No  . Drug use: No  . Sexual activity: Not Currently    Birth control/ protection: Post-menopausal   Other Topics Concern  . Not on file   Social History Narrative   Married to Larry Claytor in 1986.    Has 3 children from previous marriage, 1 in [place], 1 in [place], 1 in prison ([place]).    Lives with husband.    Right-handed.   1 cup caffeine per day.        PHYSICAL EXAM   Vitals:   10/31/15 1027  BP: 119/72  Pulse: 76  Weight: 218 lb 12 oz (99.2 kg)  Height: 5' 7" (1.702 m)    Not recorded      Body mass index is 34.26 kg/m.  PHYSICAL EXAMNIATION:  Gen: NAD, conversant, well nourised, obese, well groomed                     Cardiovascular: Regular rate rhythm, no peripheral edema, warm, nontender. Eyes: Conjunctivae clear without exudates or hemorrhage Neck: Supple, no carotid bruise. Pulmonary: Clear to auscultation bilaterally   NEUROLOGICAL EXAM:  MENTAL STATUS: Speech:    Speech is normal; fluent and spontaneous with normal comprehension.  Cognition:     Orientation to time, place and person     Normal recent and remote memory     Normal Attention span and concentration     Normal Language, naming, repeating,spontaneous speech     Fund of knowledge    CRANIAL NERVES: CN II: Visual fields are full to confrontation. Pupils are round equal and briskly reactive to light. CN III, IV,   VI: extraocular movement are normal. No ptosis. CN V: Facial sensation is intact to pinprick in all 3 divisions bilaterally. Corneal responses are intact.  CN VII: Face is symmetric with normal eye closure and smile. CN VIII: Hearing is normal to rubbing fingers CN IX, X: Palate elevates symmetrically. Phonation is normal. CN XI: Head turning and shoulder shrug are intact CN XII: Tongue is midline with normal movements and no atrophy.  MOTOR: She has mild bilateral toe flexion extension weakness, left worse than right  REFLEXES: Reflexes are 3 and symmetric at the biceps, triceps, knees, and decreased at ankles. Plantar responses are flexor.  SENSORY: Intact to light touch, pinprick, position sense, and vibration sense are intact in fingers and toes.  COORDINATION: Rapid alternating movements and fine finger movements are intact. There is no dysmetria on finger-to-nose and heel-knee-shin.    GAIT/STANCE: She needs push up from chair arm to get up from seated position, obese, mildly unsteady, antalgic gait    DIAGNOSTIC DATA (LABS, IMAGING, TESTING) - I reviewed patient records, labs, notes, testing and imaging myself where available.   ASSESSMENT AND PLAN  Rachel Herman is a 64 y.o. 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 Ph.D.  [facility] [address], [address] [place], [place] [address] Ph: [phone] Fax: [phone]  CC: Referring Provider  

## 2015-11-10 ENCOUNTER — Telehealth: Payer: Self-pay | Admitting: Family Medicine

## 2015-11-10 DIAGNOSIS — I152 Hypertension secondary to endocrine disorders: Secondary | ICD-10-CM

## 2015-11-10 MED ORDER — HYDROCHLOROTHIAZIDE 25 MG PO TABS: 25.0000 mg | ORAL_TABLET | Freq: Every day | ORAL | 0 refills | Status: DC

## 2015-11-10 NOTE — Telephone Encounter (Signed)
Medication Refill: hydrochlorothiazide (HYDRODIURIL) 25 MG tablet

## 2015-11-10 NOTE — Telephone Encounter (Signed)
HCTZ refilled. 

## 2015-11-21 ENCOUNTER — Encounter: Payer: Self-pay | Admitting: Family Medicine

## 2015-11-21 ENCOUNTER — Ambulatory Visit: Payer: Medicare Other | Attending: Family Medicine | Admitting: Family Medicine

## 2015-11-21 VITALS — BP 114/84 | HR 82 | Temp 98.9°F | Ht 67.0 in | Wt 217.4 lb

## 2015-11-21 DIAGNOSIS — Z7982 Long term (current) use of aspirin: Secondary | ICD-10-CM | POA: Diagnosis not present

## 2015-11-21 DIAGNOSIS — M4726 Other spondylosis with radiculopathy, lumbar region: Secondary | ICD-10-CM

## 2015-11-21 DIAGNOSIS — M542 Cervicalgia: Secondary | ICD-10-CM

## 2015-11-21 DIAGNOSIS — Z7984 Long term (current) use of oral hypoglycemic drugs: Secondary | ICD-10-CM | POA: Diagnosis not present

## 2015-11-21 DIAGNOSIS — Z853 Personal history of malignant neoplasm of breast: Secondary | ICD-10-CM | POA: Insufficient documentation

## 2015-11-21 DIAGNOSIS — E119 Type 2 diabetes mellitus without complications: Secondary | ICD-10-CM

## 2015-11-21 DIAGNOSIS — M5442 Lumbago with sciatica, left side: Secondary | ICD-10-CM | POA: Insufficient documentation

## 2015-11-21 DIAGNOSIS — M545 Low back pain, unspecified: Secondary | ICD-10-CM

## 2015-11-21 DIAGNOSIS — R1084 Generalized abdominal pain: Secondary | ICD-10-CM

## 2015-11-21 DIAGNOSIS — R109 Unspecified abdominal pain: Secondary | ICD-10-CM | POA: Diagnosis not present

## 2015-11-21 DIAGNOSIS — M79605 Pain in left leg: Secondary | ICD-10-CM

## 2015-11-21 LAB — GLUCOSE, POCT (MANUAL RESULT ENTRY): POC Glucose: 127 mg/dl — AB (ref 70–99)

## 2015-11-21 MED ORDER — TRAMADOL HCL 50 MG PO TABS: 50.0000 mg | ORAL_TABLET | Freq: Three times a day (TID) | ORAL | 2 refills | Status: DC | PRN

## 2015-11-21 NOTE — Progress Notes (Signed)
Pt declined flu shot °

## 2015-11-21 NOTE — Patient Instructions (Addendum)
Rachel Herman was seen today for abdominal pain.  Diagnoses and all orders for this visit:  Type 2 diabetes mellitus without complication, without long-term current use of insulin (HCC) -     POCT glucose (manual entry)  Lumbar pain with radiation down left leg -     traMADol (ULTRAM) 50 MG tablet; Take 1 tablet (50 mg total) by mouth every 8 (eight) hours as needed.  Osteoarthritis of spine with radiculopathy, lumbar region -     traMADol (ULTRAM) 50 MG tablet; Take 1 tablet (50 mg total) by mouth every 8 (eight) hours as needed.  Neck pain on right side -     traMADol (ULTRAM) 50 MG tablet; Take 1 tablet (50 mg total) by mouth every 8 (eight) hours as needed.  Generalized abdominal pain  stop metformin for two weeks  Call with update regarding abdominal pain   F/u in 6 weeks   Dr. Adrian Blackwater

## 2015-11-21 NOTE — Progress Notes (Signed)
 Subjective:  Patient ID: Rachel Herman, female    DOB: 01/20/1951  Age: 65 y.o. MRN: [id number]  CC: Abdominal Pain   HPI Rachel Herman has diabetes, hx of breast cancer, chronic low back pain she  presents for    1. Abdominal pain and back pain:  patient with 5 months of intermittent epigastric pain and bilateral pain. Sharp pains. Pain associated with L sided and low back pains.  No associated fever, no vomiting. There is  nausea. She went to ED on 08/18/15 for severe episode of pain. Trop neg x 2, EKG normal, CXR normal. CTA negative for PE. CT abdomen and pelvis negative on 07/20/15. She is still having similar pains but not as severe. Pain last 2-3 minutes at time. Occurs once every 2-3 days. When she twist her trunk she also gets similar pains and soreness in her flanks. She has a c-scope with polypectomy on 03/30/15, tubular adenomas and hyperplastic polyps recommended repeat c-scope in 3 years.   Abdominal ultrasound 09/07/2015 IMPRESSION: 1. Probable non shadowing 8 mm diameter gallstone. No sonographic evidence of acute cholecystitis. 2. Fatty infiltrative change of the liver. Limited visualization of the pancreatic tail. 3. No acute abnormality observed within the abdomen. 2. Chronic back pain: she is having low back and L sided back pain that is worsening. She is s/p lumbar laminectomy/decompression in 12/2014. She had a repeat lumbar MRI on 11/23/15: IMPRESSION:  Equivocal MRI lumbar spine (without) demonstrating: 1. Mild disc bulging at L2-3 and L3-4. 2. No spinal stenosis or foraminal narrowing.  3. Compared to MRI from 11/11/14, the disc protrusion at L3-4 has been surgically treated.   Past Surgical History:  Procedure Laterality Date  . ABDOMINAL HYSTERECTOMY N/A 09/20/2013   Procedure: HYSTERECTOMY ABDOMINAL;  Surgeon: Ugonna A Anyanwu, MD;  Location: [facility];  Service: Gynecology;  Laterality: N/A;  . BREAST LUMPECTOMY WITH AXILLARY LYMPH NODE BIOPSY Left 12/29/13    left breast   . LAPAROSCOPIC ASSISTED VAGINAL HYSTERECTOMY  09/20/2013   Procedure: LAPAROSCOPIC ASSISTED VAGINAL HYSTERECTOMY;  Surgeon: Ugonna A Anyanwu, MD;  Location: [facility];  Service: Gynecology;;  PT WAS EXAMINED WHILE UP IN [place] AND IT WAS DECIDED TO OPEN PT DUE TO LARGE MASS  . LUMBAR LAMINECTOMY/DECOMPRESSION MICRODISCECTOMY Right 12/14/2014   Procedure: Right Lumbar three-four microdiskectomy;  Surgeon: Jeffrey Jenkins, MD;  Location: [facility];  Service: Neurosurgery;  Laterality: Right;  Right Lumbar three-four microdiskectomy  . RADIOACTIVE SEED GUIDED MASTECTOMY WITH AXILLARY SENTINEL LYMPH NODE BIOPSY Left 12/29/2013   Procedure: LEFT BREAST RADIOACTIVE SEED LOCALIZED LUMPECTOMY WITH SENTINEL LYMPH NODE MAPPING;  Surgeon: Thomas Cornett, MD;  Location: [facility];  Service: General;  Laterality: Left;  . RE-EXCISION OF BREAST LUMPECTOMY Left 03/02/2014   Procedure: RE-EXCISION OF LEFT BREAST LUMPECTOMY;  Surgeon: Thomas Cornett, MD;  Location: [facility];  Service: General;  Laterality: Left;  . SALPINGOOPHORECTOMY  09/20/2013   Procedure: SALPINGO OOPHORECTOMY;  Surgeon: Ugonna A Anyanwu, MD;  Location: [facility];  Service: Gynecology;;   Social History  Substance Use Topics  . Smoking status: Never Smoker  . Smokeless tobacco: Never Used  . Alcohol use No    Outpatient Medications Prior to Visit  Medication Sig Dispense Refill  . Ascorbic Acid (VITAMIN C) 1000 MG tablet Take 1,000 mg by mouth daily.    . aspirin 325 MG tablet Take 325 mg by mouth once.    . Blood Glucose Monitoring Suppl (TRUE METRIX METER) w/Device KIT 1   each by Does not apply route as needed. 1 kit 0  . [employer] SENNA PLUS 8.6-50 MG tablet TAKE 2 TABLETS BY MOUTH AT BEDTIME AS NEEDED FOR MILD CONSTIPATION  11  . cyclobenzaprine (FLEXERIL) 10 MG tablet Take 1 tablet (10 mg total) by mouth 3 (three) times daily as needed for muscle spasms. 30 tablet 0  . exemestane (AROMASIN) 25  MG tablet Take 1 tablet (25 mg total) by mouth daily after breakfast. 90 tablet 3  . gabapentin (NEURONTIN) 300 MG capsule 300 mg during the day, 600 mg at night by mouth (Patient taking differently: Take 300-600 mg by mouth See admin instructions. 300 mg during the day, 600 mg at night by mouth) 270 capsule 1  . glucose blood test strip Use as instructed 100 each 12  . guaiFENesin-codeine (CHERATUSSIN AC) 100-10 MG/5ML syrup Take 5 mLs by mouth 3 (three) times daily as needed for cough. 120 mL 0  . hydrochlorothiazide (HYDRODIURIL) 25 MG tablet Take 1 tablet (25 mg total) by mouth daily. 90 tablet 0  . HYDROcodone-acetaminophen (NORCO/VICODIN) 5-325 MG tablet Take 1 tablet by mouth every 6 (six) hours as needed for moderate pain. 60 tablet 0  . Lancets (ACCU-CHEK MULTICLIX) lancets 1 each by Other route 3 (three) times daily. ICD 10 E11.9 100 each 12  . lisinopril (PRINIVIL,ZESTRIL) 20 MG tablet Take 1 tablet (20 mg total) by mouth daily. 90 tablet 2  . metFORMIN (GLUCOPHAGE) 500 MG tablet Take 1 tablet (500 mg total) by mouth 2 (two) times daily. 180 tablet 3  . Multiple Vitamins-Minerals (MULTIVITAMIN WITH MINERALS) tablet Take 1 tablet by mouth daily. Reported on 03/13/2015    . ranitidine (ZANTAC) 150 MG tablet Take 1 tablet (150 mg total) by mouth 2 (two) times daily. 60 tablet 2  . traMADol (ULTRAM) 50 MG tablet Take 1 tablet (50 mg total) by mouth every 8 (eight) hours as needed. 90 tablet 2  . vitamin D, CHOLECALCIFEROL, 400 UNITS tablet Take 400 Units by mouth daily.     No facility-administered medications prior to visit.     ROS Review of Systems  Constitutional: Negative for chills and fever.  Eyes: Negative for visual disturbance.  Respiratory: Positive for cough. Negative for shortness of breath.   Cardiovascular: Negative for chest pain.  Gastrointestinal: Positive for abdominal pain and nausea. Negative for abdominal distention, anal bleeding, blood in stool, constipation,  diarrhea, rectal pain and vomiting.  Musculoskeletal: Positive for arthralgias, back pain, myalgias and neck pain.  Skin: Negative for rash.  Allergic/Immunologic: Negative for immunocompromised state.  Neurological: Negative for dizziness.  Hematological: Negative for adenopathy. Does not bruise/bleed easily.  Psychiatric/Behavioral: Negative for dysphoric mood and suicidal ideas.    Objective:  BP 114/84 (BP Location: Left Arm, Patient Position: Sitting, Cuff Size: Large)   Pulse 82   Temp 98.9 F (37.2 C) (Oral)   Ht 5' 7" (1.702 m)   Wt 217 lb 6.4 oz (98.6 kg)   SpO2 98%   BMI 34.05 kg/m   BP/Weight 11/21/2015 9/67/8938 1/0/1751  Systolic BP 025 852 778  Diastolic BP 84 72 82  Wt. (Lbs) 217.4 218.75 219.8  BMI 34.05 34.26 34.43   Physical Exam  Constitutional: She is oriented to person, place, and time. She appears well-developed and well-nourished. No distress.  HENT:  Head: Normocephalic and atraumatic.  Cardiovascular: Normal rate, regular rhythm, normal heart sounds and intact distal pulses.   Pulmonary/Chest: Effort normal and breath sounds normal.  Abdominal: Soft. Bowel sounds  are normal. She exhibits no distension and no mass. There is generalized tenderness. There is no rebound and no guarding.  Obese   Musculoskeletal: She exhibits no edema.       Lumbar back: She exhibits decreased range of motion and tenderness. She exhibits no bony tenderness, no swelling, no edema, no deformity, no laceration, no pain, no spasm and normal pulse.  Neurological: She is alert and oriented to person, place, and time.  Skin: Skin is warm and dry. No rash noted.  Psychiatric: She has a normal mood and affect.   Lab Results  Component Value Date   HGBA1C 5.9 09/04/2015   CBG 110  Assessment & Plan:   Rachel Herman was seen today for abdominal pain.  Diagnoses and all orders for this visit:  Type 2 diabetes mellitus without complication, without long-term current use of insulin  (HCC) -     POCT glucose (manual entry)  Lumbar pain with radiation down left leg -     traMADol (ULTRAM) 50 MG tablet; Take 1 tablet (50 mg total) by mouth every 8 (eight) hours as needed.  Osteoarthritis of spine with radiculopathy, lumbar region -     traMADol (ULTRAM) 50 MG tablet; Take 1 tablet (50 mg total) by mouth every 8 (eight) hours as needed.  Neck pain on right side -     traMADol (ULTRAM) 50 MG tablet; Take 1 tablet (50 mg total) by mouth every 8 (eight) hours as needed.  Generalized abdominal pain    No orders of the defined types were placed in this encounter.   Follow-up: No Follow-up on file.   Josalyn Funches MD   

## 2015-11-23 ENCOUNTER — Ambulatory Visit
Admission: RE | Admit: 2015-11-23 | Discharge: 2015-11-23 | Disposition: A | Discharge status: Home / Self Care | Payer: Medicare Other | Source: Ambulatory Visit | Attending: Neurology | Admitting: Neurology

## 2015-11-23 DIAGNOSIS — M48062 Spinal stenosis, lumbar region with neurogenic claudication: Secondary | ICD-10-CM

## 2015-11-23 DIAGNOSIS — M542 Cervicalgia: Secondary | ICD-10-CM

## 2015-11-23 DIAGNOSIS — M50223 Other cervical disc displacement at C6-C7 level: Secondary | ICD-10-CM | POA: Diagnosis not present

## 2015-11-23 DIAGNOSIS — M5416 Radiculopathy, lumbar region: Secondary | ICD-10-CM | POA: Diagnosis not present

## 2015-11-23 DIAGNOSIS — M5126 Other intervertebral disc displacement, lumbar region: Secondary | ICD-10-CM | POA: Diagnosis not present

## 2015-11-24 ENCOUNTER — Ambulatory Visit (INDEPENDENT_AMBULATORY_CARE_PROVIDER_SITE_OTHER): Payer: Medicare Other | Admitting: Neurology

## 2015-11-24 ENCOUNTER — Encounter (INDEPENDENT_AMBULATORY_CARE_PROVIDER_SITE_OTHER): Payer: Self-pay

## 2015-11-24 DIAGNOSIS — Z0289 Encounter for other administrative examinations: Secondary | ICD-10-CM

## 2015-11-24 DIAGNOSIS — M542 Cervicalgia: Secondary | ICD-10-CM

## 2015-11-24 DIAGNOSIS — M48062 Spinal stenosis, lumbar region with neurogenic claudication: Secondary | ICD-10-CM

## 2015-11-24 DIAGNOSIS — M5416 Radiculopathy, lumbar region: Secondary | ICD-10-CM

## 2015-11-24 NOTE — Procedures (Signed)
Full Name: Rachel Herman Gender: Female MRN #: [id number] Date of Birth: [birth year]    Visit Date: 11/24/2015 12:16 Age: 65 Years 21 Months Old Examining Physician: Marcial Pacas, MD   History: 65 years old female, with history of lumbar decompression surgery for right lumbar radiculopathy, low back pain in November 2016, presented with recurrent left-sided low back pain, radiating pain to left lower extremity  Nerve conduction study: Bilateral peroneal, sural sensory responses were normal. Bilateral tibial motor responses were normal.  Left peroneal to EDB motor responses were normal. Right peroneal to EDB motor responses showed severely decreased C map amplitude.  Right tibial H reflex was absent, left tibial H reflex was present.  Electromyography:  Selective needle examinations were performed at bilateral lower extremity muscles, bilateral lumbar sacral paraspinal muscles.  Needle examinations of bilateral tibialis anterior, tibialis posterior, medial gastrocnemius, peroneal longus, vastus lateralis showed no significant abnormality.  There was well-healed lower lumbar scar, increased insertional activity along bilateral lumbar paraspinal muscles, with enlarged complex motor unit potentials at bilateral L4-5 S1.  Conclusion:    This is a mild abnormal study, there is electrodiagnostic evidence of decreased right peroneal motor response, and absent right H reflex, this is most consistent with her previous history of right lumbar radiculopathy, there is no evidence of active process, there is no evidence of left lumbosacral radiculopathy, or large fiber peripheral neuropathy.  ------------------------------ Marcial Pacas, MD   [facility] [place], [place] [address] Tel: [phone] Fax: [phone]           [facility]    Nerve / Sites Rec. Site Peak Lat Ref.  Amp Ref. Segments Distance    ms ms V V  cm  L Superficial peroneal - Ankle    Lat leg Ankle 3.6 ?4.4 10 ?6 Lat leg - Ankle 14  L Sural - Ankle (Calf)     Calf Ankle 3.5 ?4.4 8 ?6 Calf - Ankle 14  R Superficial peroneal - Ankle     Lat leg Ankle 4.2 ?4.4 15 ?6 Lat leg - Ankle 14  R Sural - Ankle (Calf)     Calf Ankle 4.3 ?4.4 8 ?6 Calf - Ankle 14     [facility]    Nerve / Sites Muscle Latency Ref. Amplitude Ref. Rel Amp Segments Distance Lat Diff Velocity Ref. Area    ms ms mV mV %  cm ms m/s m/s mVms  L Tibial - AH     Ankle AH 5.5 ?5.8 4.9 ?4.0 100 Ankle - AH 9    8.9     Pop fossa AH 12.9  3.1  63.6 Pop fossa - Ankle 32 7.3 44 ?41 7.4  L Peroneal - EDB     Ankle EDB 3.9 ?6.5 4.9 ?2.0 100 Ankle - EDB 9    13.4     Fib head EDB 10.3  4.7  96.8 Fib head - Ankle 38 6.4 60 ?44 13.0     Pop fossa EDB 12.5  4.7  98.5 Pop fossa - Fib head 12.5 2.2 56 ?44 13.0         Pop fossa - Ankle  8.6     R Tibial - AH     Ankle AH 4.4 ?5.8 4.5 ?4.0 100 Ankle - AH 9    9.6     Pop fossa AH 13.1  4.8  106 Pop fossa - Ankle 40 8.8 46 ?41 11.7  R Peroneal -  EDB     Ankle EDB 6.7 ?6.5 0.1 ?2.0 100 Ankle - EDB 9    0.3     Fib head EDB 16.5  0.1  133 Fib head - Ankle  9.7  ?44      Pop fossa EDB NR  NR  NR Pop fossa - Fib head  NR  ?44 NR         Pop fossa - Ankle  NR        F  Wave    Nerve F Lat Ref. M Lat Ref. F-M Lat Min F Lat Ref. Min M Lat Min F-M   ms ms ms ms ms ms ms ms ms  L Tibial - AH 48.5 ?56.0 6.1 ?32.0 42.4 35.9 ?56.0 6.1 29.6  L Peroneal - EDB 48.9 ?56.0 4.3 ?32.0 44.6 36.0 ?56.0 4.3 31.7  R Tibial - AH 50.2 ?56.0 4.6 ?32.0 45.6 43.4 ?56.0 4.6 38.8  R Peroneal - EDB NR ?56.0 NR ?32.0 NR NR ?56.0 NR NR     H Reflex    Nerve H Lat   ms   Left Right Ref.  Tibial - [place] 32.4 NR ?35.0

## 2015-11-26 NOTE — Assessment & Plan Note (Addendum)
Persistent abdominal pain  No red fags Tramadol refilled Two week trial off metformin

## 2015-11-27 ENCOUNTER — Encounter: Payer: Self-pay | Admitting: Hematology

## 2015-11-27 NOTE — Progress Notes (Signed)
Attempted to call pt regarding re-enrolling for assistance thru [employer] but was unable to leave a message.  Will try again on 11/28/15.

## 2015-11-28 ENCOUNTER — Encounter: Payer: Self-pay | Admitting: Neurology

## 2015-11-28 ENCOUNTER — Telehealth: Payer: Self-pay | Admitting: Neurology

## 2015-11-28 ENCOUNTER — Ambulatory Visit (INDEPENDENT_AMBULATORY_CARE_PROVIDER_SITE_OTHER): Payer: Medicare Other | Admitting: Neurology

## 2015-11-28 ENCOUNTER — Encounter: Payer: Self-pay | Admitting: Hematology

## 2015-11-28 ENCOUNTER — Encounter: Payer: Self-pay | Admitting: *Deleted

## 2015-11-28 VITALS — BP 117/74 | HR 67 | Ht 67.0 in

## 2015-11-28 DIAGNOSIS — R4589 Other symptoms and signs involving emotional state: Secondary | ICD-10-CM

## 2015-11-28 DIAGNOSIS — M545 Low back pain, unspecified: Secondary | ICD-10-CM

## 2015-11-28 DIAGNOSIS — M79605 Pain in left leg: Secondary | ICD-10-CM

## 2015-11-28 DIAGNOSIS — M5416 Radiculopathy, lumbar region: Secondary | ICD-10-CM | POA: Diagnosis not present

## 2015-11-28 DIAGNOSIS — F329 Major depressive disorder, single episode, unspecified: Secondary | ICD-10-CM

## 2015-11-28 DIAGNOSIS — M4726 Other spondylosis with radiculopathy, lumbar region: Secondary | ICD-10-CM

## 2015-11-28 MED ORDER — DULOXETINE HCL 60 MG PO CPEP: 60.0000 mg | ORAL_CAPSULE | Freq: Every day | ORAL | 11 refills | Status: DC

## 2015-11-28 MED ORDER — GABAPENTIN 300 MG PO CAPS: ORAL_CAPSULE | ORAL | 4 refills | Status: DC

## 2015-11-28 NOTE — Progress Notes (Signed)
Called pt & left msg regarding re-enrolling for assistance thru [employer].  Requested she return my call.

## 2015-11-28 NOTE — Progress Notes (Signed)
Chief Complaint  Patient presents with  . Back Pain/Gait Abnormality    She would like to discuss her NCV/EMG and MRI results. Says gabapentin and hydrocodone have been helpful in keeping her pain tolerable.   Chief Complaint  Patient presents with  . Back Pain/Gait Abnormality    She would like to discuss her NCV/EMG and MRI results. Says gabapentin and hydrocodone have been helpful in keeping her pain tolerable.      PATIENT: Rachel Herman DOB: [birth year]  Chief Complaint  Patient presents with  . Back Pain/Gait Abnormality    She would like to discuss her NCV/EMG and MRI results. Says gabapentin and hydrocodone have been helpful in keeping her pain tolerable.     HISTORICAL  Rachel Herman is a 65 years old right-handed female, seen in refer by  her primary care physician Boykin Nearing, MD for evaluation of dizziness  She has history of left breast cancer, status post left lobectomy followed by radiation, hypertension, diabetes  She presented with dizziness in July 15 2014, at nighttime, she turned and lie down at her right side, noticed acute onset of vertigo, nausea, unsteady gait, she has to be helped by her husband to get up, symptoms gradually improved up she lied down still,  But since the initial event, she has recurrent episode of transient dizziness, especially when she lying to her right side, improved if she lies on her left side, also triggered by sudden positional change. She denied hearing loss, no tinnitus.  She denies significant gait difficulty  She has tried repositional maneuver at home, which has helped her, right side first. We have personally reviewed MRI brain in September 2016 that was normal. This is most consistent with a benign positional vertigo    She complains of over one year history of severe right-sided low back pain, radiating pain to her right hip, right lower extremity, worsening constipation, 10 out of 10 sometimes, she has tried  Ibuprofen, could not tolerate due to significant GI side effect, she been taking frequent Tylenol, tramadol, Neurontin 300 mg 4 tablets daily provides limited help  Electrodiagnostic study today confirmed mild bilateral lumbar radiculopathy, right worse than left,  MRI lumbar in October 2016: Right paracentral disc herniation at L3-L4 superimposed on facet hypertrophy with severe right lateral recess stenosis (descending right L4 nerve root level) and moderate to severe overall spinal stenosis. Mild for age lumbar spine degeneration elsewhere.  She had right L3-4 laminotomy and foraminotomy to decompress the right L4 nerve root using micro-dissection in December 14 2014 by Dr. Arnoldo Morale, her right low back pain has much improved, she is still taking tramadol as needed. She still has mid low back pain. Now she has left hip pain.   UPDATE Sept 26th 2017: She had lumbar decompression surgery right L3-4 laminotomy and foraminotomy by Dr. Arnoldo Morale on December 14 2014, which did help her low back pain and radiating pain to right leg, today she come in  complains of left-sided low back pain radiating pain to left lower extremity, left lateral leg paresthesia since March 2017, she has been taking gabapentin 300 mg 1 in the morning 2 at night, tramadol as needed with limited help, she also noticed unsteady gait, worsening urinary urgency  UPDATE Oct 24th 2017: We have personally reviewed MRI of cervical spine October 2017, mild multilevel degenerative disc diseasesignificant canal or foraminal stenosis, MRI of lumbar spine, degenerative changes, mild disc bulging L2-3, L3-4, facet joint hypertrophy, no significant foraminal stenosis.  EMG nerve conduction study in October 2017 showed no evidence of large fiber peripheral neuropathy or active lumbar sacral radiculopathy,  She continue complains of significant low back pain, midline, radiating to right, sometimes left side, gait abnormality, urinary urgency.  She  is on polypharmacy treatment, gabapentin up to 300 mg 2 tablets 3 times a day, hydrocodone, Flexeril as needed has been helpful,  REVIEW OF SYSTEMS: Full 14 system review of systems performed and notable only for as above  ALLERGIES: No Known Allergies  HOME MEDICATIONS: Current Outpatient Prescriptions  Medication Sig Dispense Refill  . Ascorbic Acid (VITAMIN C) 1000 MG tablet Take 1,000 mg by mouth daily.    Marland Kitchen aspirin 325 MG tablet Take 325 mg by mouth once.    . Blood Glucose Monitoring Suppl (TRUE METRIX METER) w/Device KIT 1 each by Does not apply route as needed. 1 kit 0  . [facility] SENNA PLUS 8.6-50 MG tablet TAKE 2 TABLETS BY MOUTH AT BEDTIME AS NEEDED FOR MILD CONSTIPATION  11  . cyclobenzaprine (FLEXERIL) 10 MG tablet Take 1 tablet (10 mg total) by mouth 3 (three) times daily as needed for muscle spasms. 30 tablet 0  . exemestane (AROMASIN) 25 MG tablet Take 1 tablet (25 mg total) by mouth daily after breakfast. 90 tablet 3  . gabapentin (NEURONTIN) 300 MG capsule 300 mg during the day, 600 mg at night by mouth (Patient taking differently: Take 300-600 mg by mouth See admin instructions. 300 mg during the day, 600 mg at night by mouth) 270 capsule 1  . glucose blood test strip Use as instructed 100 each 12  . guaiFENesin-codeine (CHERATUSSIN AC) 100-10 MG/5ML syrup Take 5 mLs by mouth 3 (three) times daily as needed for cough. 120 mL 0  . hydrochlorothiazide (HYDRODIURIL) 25 MG tablet Take 1 tablet (25 mg total) by mouth daily. 90 tablet 0  . HYDROcodone-acetaminophen (NORCO/VICODIN) 5-325 MG tablet Take 1 tablet by mouth every 6 (six) hours as needed for moderate pain. 60 tablet 0  . Lancets (ACCU-CHEK MULTICLIX) lancets 1 each by Other route 3 (three) times daily. ICD 10 E11.9 100 each 12  . lisinopril (PRINIVIL,ZESTRIL) 20 MG tablet Take 1 tablet (20 mg total) by mouth daily. 90 tablet 2  . metFORMIN (GLUCOPHAGE) 500 MG tablet Take 1 tablet (500 mg total) by mouth 2 (two) times daily.  180 tablet 3  . Multiple Vitamins-Minerals (MULTIVITAMIN WITH MINERALS) tablet Take 1 tablet by mouth daily. Reported on 03/13/2015    . ranitidine (ZANTAC) 150 MG tablet Take 1 tablet (150 mg total) by mouth 2 (two) times daily. 60 tablet 2  . traMADol (ULTRAM) 50 MG tablet Take 1 tablet (50 mg total) by mouth every 8 (eight) hours as needed. 90 tablet 2  . vitamin D, CHOLECALCIFEROL, 400 UNITS tablet Take 400 Units by mouth daily.     No current facility-administered medications for this visit.     PAST MEDICAL HISTORY: Past Medical History:  Diagnosis Date  . Breast cancer ([place]) 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  . Diabetes mellitus ([place]) 09/16/13   Diagnosed on 09/16/13; HgA1C was 7.2/metformin  . Dizziness   . GERD (gastroesophageal reflux disease)   . Headache   . Hyperlipidemia   . Hypertension 2014  . Low back pain   . Obesity, morbid, BMI 40.0-49.9 ([place])   . PONV (postoperative nausea and vomiting)   . S/P radiation therapy 04/05/14-05/20/14   left breast 60.4Gy totaldose    PAST SURGICAL HISTORY:  Past Surgical History:  Procedure Laterality Date  . ABDOMINAL HYSTERECTOMY N/A 09/20/2013   Procedure: HYSTERECTOMY ABDOMINAL;  Surgeon: Osborne Oman, MD;  Location: [facility];  Service: Gynecology;  Laterality: N/A;  . BREAST LUMPECTOMY WITH AXILLARY LYMPH NODE BIOPSY Left 12/29/13   left breast   . LAPAROSCOPIC ASSISTED VAGINAL HYSTERECTOMY  09/20/2013   Procedure: LAPAROSCOPIC ASSISTED VAGINAL HYSTERECTOMY;  Surgeon: Osborne Oman, MD;  Location: [facility];  Service: Gynecology;;  PT WAS EXAMINED WHILE UP IN STIRRUPS AND IT WAS DECIDED TO OPEN PT DUE TO LARGE MASS  . LUMBAR LAMINECTOMY/DECOMPRESSION MICRODISCECTOMY Right 12/14/2014   Procedure: Right Lumbar three-four microdiskectomy;  Surgeon: Newman Pies, MD;  Location: [facility];  Service: Neurosurgery;  Laterality: Right;  Right Lumbar three-four microdiskectomy  . RADIOACTIVE SEED GUIDED MASTECTOMY WITH  AXILLARY SENTINEL LYMPH NODE BIOPSY Left 12/29/2013   Procedure: LEFT BREAST RADIOACTIVE SEED LOCALIZED LUMPECTOMY WITH SENTINEL LYMPH NODE MAPPING;  Surgeon: Erroll Luna, MD;  Location: [facility];  Service: General;  Laterality: Left;  . RE-EXCISION OF BREAST LUMPECTOMY Left 03/02/2014   Procedure: RE-EXCISION OF LEFT BREAST LUMPECTOMY;  Surgeon: Erroll Luna, MD;  Location: [facility];  Service: General;  Laterality: Left;  . SALPINGOOPHORECTOMY  09/20/2013   Procedure: SALPINGO OOPHORECTOMY;  Surgeon: Osborne Oman, MD;  Location: [facility];  Service: Gynecology;;    FAMILY HISTORY: Family History  Problem Relation Age of Onset  . Diabetes Mother   . Multiple myeloma Mother 53  . Hearing loss Mother   . Diabetes Brother   . Cancer Brother 40    prostate cancer   . Diabetes Maternal Aunt   . Leukemia Maternal Aunt     dx in her 72s  . Alcoholism Brother   . Heart attack Father   . Diabetes Sister   . Diabetes Son     SOCIAL HISTORY:  Social History   Social History  . Marital status: Married    Spouse name: Marilynn Ekstein   . Number of children: 3  . Years of education: 12   Occupational History  .  Unemployed   Social History Main Topics  . Smoking status: Never Smoker  . Smokeless tobacco: Never Used  . Alcohol use No  . Drug use: No  . Sexual activity: Not Currently    Birth control/ protection: Post-menopausal   Other Topics Concern  . Not on file   Social History Narrative   Married to [employer] in 1986.    Has 3 children from previous marriage, 1 in [place], 1 in [place], 1 in prison ([place]).    Lives with husband.    Right-handed.   1 cup caffeine per day.        PHYSICAL EXAM   Vitals:   11/28/15 0708  BP: 117/74  Pulse: 67  Height: _0  (1.702 m)    Not recorded      There is no height or weight on file to calculate BMI.  PHYSICAL EXAMNIATION:  Gen: NAD, conversant, well nourised,  obese, well groomed                     Cardiovascular: Regular rate rhythm, no peripheral edema, warm, nontender. Eyes: Conjunctivae clear without exudates or hemorrhage Neck: Supple, no carotid bruise. Pulmonary: Clear to auscultation bilaterally   NEUROLOGICAL EXAM:  MENTAL STATUS: Speech:    Speech is normal; fluent and spontaneous with normal comprehension.  Cognition:     Orientation  to time, place and person     Normal recent and remote memory     Normal Attention span and concentration     Normal Language, naming, repeating,spontaneous speech     Fund of knowledge   CRANIAL NERVES: CN II: Visual fields are full to confrontation. Pupils are round equal and briskly reactive to light. CN III, IV, VI: extraocular movement are normal. No ptosis. CN V: Facial sensation is intact to pinprick in all 3 divisions bilaterally. Corneal responses are intact.  CN VII: Face is symmetric with normal eye closure and smile. CN VIII: Hearing is normal to rubbing fingers CN IX, X: Palate elevates symmetrically. Phonation is normal. CN XI: Head turning and shoulder shrug are intact CN XII: Tongue is midline with normal movements and no atrophy.  MOTOR: She has mild bilateral toe flexion extension weakness, left worse than right  REFLEXES: Reflexes are 3 and symmetric at the biceps, triceps, knees, and decreased at ankles. Plantar responses are flexor.  SENSORY: Intact to light touch, pinprick, position sense, and vibration sense are intact in fingers and toes.  COORDINATION: Rapid alternating movements and fine finger movements are intact. There is no dysmetria on finger-to-nose and heel-knee-shin.    GAIT/STANCE: She needs push up from chair arm to get up from seated position, obese, mildly unsteady, antalgic gait    DIAGNOSTIC DATA (LABS, IMAGING, TESTING) - I reviewed patient records, labs, notes, testing and imaging myself where available.   ASSESSMENT AND PLAN  Rachel Herman is a 65 y.o. female   Low back pain, radiating pain to bilateral lower extremity  There was no evidence of active lumbar sacral radiculopathy, based on MRI lumbar, electrodiagnostic study, and physical examinations  Gait abnormality Limitation from her low back pain, I have suggested physical therapy, pain management, Refill her gabapentin, add on Cymbalta I also suggested her heating pad, return to clinic for new issues   Marcial Pacas, M.D. Ph.D.  [facility] [address] [place], [place] [address] Ph: [phone] Fax: [phone]  CC: Referring Provider

## 2015-11-28 NOTE — Telephone Encounter (Signed)
Per Dr. Krista Blue, void duloxetine prescription and have patient discuss further medication changes with pain management physician.  Spoke to patient and she is agreeable to this plan.  Called pharmacy back and canceled duloxetine prescription (spoke to [place]).

## 2015-11-28 NOTE — Progress Notes (Signed)
Pt returned my call and would like to come on 11/30/15 to complete her portion of the re-enrollment form and bring her proof of income.  Once received I will fax to [place] for processing.

## 2015-11-28 NOTE — Telephone Encounter (Signed)
Rachel Herman/[facility] [phone] called to report a possible drug interaction between DULoxetine (CYMBALTA) 60 MG capsule and traMADol (ULTRAM) 50 MG tablet . Can possibly cause serotonin syndrome and/or seizure.  Please call

## 2015-11-30 ENCOUNTER — Encounter: Payer: Self-pay | Admitting: Hematology

## 2015-11-30 NOTE — Progress Notes (Signed)
Faxed re-enrollment form for Aromasin to [employer] today.  Received confirmation that it went thru.

## 2015-12-06 ENCOUNTER — Encounter: Payer: Self-pay | Admitting: Hematology

## 2015-12-06 NOTE — Progress Notes (Signed)
Pt is approved w/ the [employer] Patient Assistance Program for Aromasin until 02/03/17.  The drug has been shipped to pt's home address.

## 2015-12-26 ENCOUNTER — Encounter: Payer: Self-pay | Admitting: Family Medicine

## 2015-12-26 ENCOUNTER — Ambulatory Visit: Payer: Medicare Other | Attending: Family Medicine | Admitting: Family Medicine

## 2015-12-26 VITALS — BP 122/85 | HR 72 | Temp 98.0°F | Ht 67.0 in | Wt 214.6 lb

## 2015-12-26 DIAGNOSIS — Z7982 Long term (current) use of aspirin: Secondary | ICD-10-CM | POA: Diagnosis not present

## 2015-12-26 DIAGNOSIS — Z23 Encounter for immunization: Secondary | ICD-10-CM | POA: Insufficient documentation

## 2015-12-26 DIAGNOSIS — M4726 Other spondylosis with radiculopathy, lumbar region: Secondary | ICD-10-CM

## 2015-12-26 DIAGNOSIS — M545 Low back pain, unspecified: Secondary | ICD-10-CM

## 2015-12-26 DIAGNOSIS — Z853 Personal history of malignant neoplasm of breast: Secondary | ICD-10-CM | POA: Insufficient documentation

## 2015-12-26 DIAGNOSIS — Z7984 Long term (current) use of oral hypoglycemic drugs: Secondary | ICD-10-CM | POA: Diagnosis not present

## 2015-12-26 DIAGNOSIS — M79605 Pain in left leg: Secondary | ICD-10-CM

## 2015-12-26 DIAGNOSIS — M549 Dorsalgia, unspecified: Secondary | ICD-10-CM | POA: Diagnosis present

## 2015-12-26 DIAGNOSIS — M48062 Spinal stenosis, lumbar region with neurogenic claudication: Secondary | ICD-10-CM | POA: Diagnosis not present

## 2015-12-26 DIAGNOSIS — M542 Cervicalgia: Secondary | ICD-10-CM | POA: Insufficient documentation

## 2015-12-26 DIAGNOSIS — Z79899 Other long term (current) drug therapy: Secondary | ICD-10-CM | POA: Diagnosis not present

## 2015-12-26 DIAGNOSIS — E119 Type 2 diabetes mellitus without complications: Secondary | ICD-10-CM | POA: Insufficient documentation

## 2015-12-26 LAB — GLUCOSE, POCT (MANUAL RESULT ENTRY): POC Glucose: 94 mg/dl (ref 70–99)

## 2015-12-26 MED ORDER — TRAMADOL HCL 50 MG PO TABS: 50.0000 mg | ORAL_TABLET | Freq: Three times a day (TID) | ORAL | 2 refills | Status: DC | PRN

## 2015-12-26 MED ORDER — LUMBAR BACK BRACE/SUPPORT PAD MISC: 1.0000 | Freq: Every day | 0 refills | Status: DC

## 2015-12-26 MED ORDER — DICLOFENAC SODIUM 1 % TD GEL: 2.0000 g | Freq: Four times a day (QID) | TRANSDERMAL | 2 refills | Status: DC

## 2015-12-26 NOTE — Progress Notes (Signed)
Pt is here today for back and abdominal pain.pt states that she is still having the same pain from last OV.

## 2015-12-26 NOTE — Progress Notes (Signed)
 Subjective:  Patient ID: Rachel Herman, female    DOB: 12/20/1950  Age: 65 y.o. MRN: [id number]  CC: Back Pain   HPI Rachel Herman has diabetes, hx of breast cancer, chronic low back pain she  presents for    1. Abdominal pain and back pain:  patient with 5 months of intermittent epigastric pain and bilateral pain. Sharp pains. Pain associated with L sided and low back pains.  No associated fever, no vomiting. There is  nausea. She went to ED on 08/18/15 for severe episode of pain. Trop neg x 2, EKG normal, CXR normal. CTA negative for PE. CT abdomen and pelvis negative on 07/20/15. She is still having similar pains but not as severe. Pain last 2-3 minutes at time. Occurs once every 2-3 days. When she twist her trunk she also gets similar pains and soreness in her flanks. She has a c-scope with polypectomy on 03/30/15, tubular adenomas and hyperplastic polyps recommended repeat c-scope in 3 years.   She did two week trial of of metformin without no changes. She still has nausea. No vomiting. Epigastric pain is 6/10. Back pain is 7/10.   Abdominal ultrasound 09/07/2015 IMPRESSION: 1. Probable non shadowing 8 mm diameter gallstone. No sonographic evidence of acute cholecystitis. 2. Fatty infiltrative change of the liver. Limited visualization of the pancreatic tail. 3. No acute abnormality observed within the abdomen. 2. Chronic back pain: she is having low back and L sided back pain that is worsening. She is s/p lumbar laminectomy/decompression in 12/2014. She had a repeat lumbar MRI on 11/23/15: IMPRESSION:  Equivocal MRI lumbar spine (without) demonstrating: 1. Mild disc bulging at L2-3 and L3-4. 2. No spinal stenosis or foraminal narrowing.  3. Compared to MRI from 11/11/14, the disc protrusion at L3-4 has been surgically treated.   Past Surgical History:  Procedure Laterality Date  . ABDOMINAL HYSTERECTOMY N/A 09/20/2013   Procedure: HYSTERECTOMY ABDOMINAL;  Surgeon: Ugonna A  Anyanwu, MD;  Location: [facility];  Service: Gynecology;  Laterality: N/A;  . BREAST LUMPECTOMY WITH AXILLARY LYMPH NODE BIOPSY Left 12/29/13   left breast   . LAPAROSCOPIC ASSISTED VAGINAL HYSTERECTOMY  09/20/2013   Procedure: LAPAROSCOPIC ASSISTED VAGINAL HYSTERECTOMY;  Surgeon: Ugonna A Anyanwu, MD;  Location: [facility];  Service: Gynecology;;  PT WAS EXAMINED WHILE UP IN STIRRUPS AND IT WAS DECIDED TO OPEN PT DUE TO LARGE MASS  . LUMBAR LAMINECTOMY/DECOMPRESSION MICRODISCECTOMY Right 12/14/2014   Procedure: Right Lumbar three-four microdiskectomy;  Surgeon: Jeffrey Jenkins, MD;  Location: [facility];  Service: Neurosurgery;  Laterality: Right;  Right Lumbar three-four microdiskectomy  . RADIOACTIVE SEED GUIDED MASTECTOMY WITH AXILLARY SENTINEL LYMPH NODE BIOPSY Left 12/29/2013   Procedure: LEFT BREAST RADIOACTIVE SEED LOCALIZED LUMPECTOMY WITH SENTINEL LYMPH NODE MAPPING;  Surgeon: Thomas Cornett, MD;  Location: [facility];  Service: General;  Laterality: Left;  . RE-EXCISION OF BREAST LUMPECTOMY Left 03/02/2014   Procedure: RE-EXCISION OF LEFT BREAST LUMPECTOMY;  Surgeon: Thomas Cornett, MD;  Location: [facility];  Service: General;  Laterality: Left;  . SALPINGOOPHORECTOMY  09/20/2013   Procedure: SALPINGO OOPHORECTOMY;  Surgeon: Ugonna A Anyanwu, MD;  Location: [facility];  Service: Gynecology;;   Social History  Substance Use Topics  . Smoking status: Never Smoker  . Smokeless tobacco: Never Used  . Alcohol use No    Outpatient Medications Prior to Visit  Medication Sig Dispense Refill  . Ascorbic Acid (VITAMIN C) 1000 MG tablet Take 1,000 mg by mouth daily.    .   aspirin 325 MG tablet Take 325 mg by mouth once.    . Blood Glucose Monitoring Suppl (TRUE METRIX METER) w/Device KIT 1 each by Does not apply route as needed. 1 kit 0  . [facility] SENNA PLUS 8.6-50 MG tablet TAKE 2 TABLETS BY MOUTH AT BEDTIME AS NEEDED FOR MILD CONSTIPATION  11  . cyclobenzaprine (FLEXERIL) 10  MG tablet Take 1 tablet (10 mg total) by mouth 3 (three) times daily as needed for muscle spasms. 30 tablet 0  . exemestane (AROMASIN) 25 MG tablet Take 1 tablet (25 mg total) by mouth daily after breakfast. 90 tablet 3  . gabapentin (NEURONTIN) 300 MG capsule 300 mg during the day, 600 mg at night by mouth 270 capsule 4  . glucose blood test strip Use as instructed 100 each 12  . guaiFENesin-codeine (CHERATUSSIN AC) 100-10 MG/5ML syrup Take 5 mLs by mouth 3 (three) times daily as needed for cough. 120 mL 0  . hydrochlorothiazide (HYDRODIURIL) 25 MG tablet Take 1 tablet (25 mg total) by mouth daily. 90 tablet 0  . HYDROcodone-acetaminophen (NORCO/VICODIN) 5-325 MG tablet Take 1 tablet by mouth every 6 (six) hours as needed for moderate pain. 60 tablet 0  . Lancets (ACCU-CHEK MULTICLIX) lancets 1 each by Other route 3 (three) times daily. ICD 10 E11.9 100 each 12  . lisinopril (PRINIVIL,ZESTRIL) 20 MG tablet Take 1 tablet (20 mg total) by mouth daily. 90 tablet 2  . metFORMIN (GLUCOPHAGE) 500 MG tablet Take 1 tablet (500 mg total) by mouth 2 (two) times daily. 180 tablet 3  . Multiple Vitamins-Minerals (MULTIVITAMIN WITH MINERALS) tablet Take 1 tablet by mouth daily. Reported on 03/13/2015    . ranitidine (ZANTAC) 150 MG tablet Take 1 tablet (150 mg total) by mouth 2 (two) times daily. 60 tablet 2  . traMADol (ULTRAM) 50 MG tablet Take 1 tablet (50 mg total) by mouth every 8 (eight) hours as needed. 90 tablet 2  . vitamin D, CHOLECALCIFEROL, 400 UNITS tablet Take 400 Units by mouth daily.     No facility-administered medications prior to visit.     ROS Review of Systems  Constitutional: Negative for chills and fever.  Eyes: Negative for visual disturbance.  Respiratory: Positive for cough. Negative for shortness of breath.   Cardiovascular: Negative for chest pain.  Gastrointestinal: Positive for abdominal pain and nausea. Negative for abdominal distention, anal bleeding, blood in stool,  constipation, diarrhea, rectal pain and vomiting.  Musculoskeletal: Positive for arthralgias, back pain, myalgias and neck pain.  Skin: Negative for rash.  Allergic/Immunologic: Negative for immunocompromised state.  Neurological: Negative for dizziness.  Hematological: Negative for adenopathy. Does not bruise/bleed easily.  Psychiatric/Behavioral: Negative for dysphoric mood and suicidal ideas.   Lab Results  Component Value Date   HGBA1C 5.9 09/04/2015   CBG 94  Objective:  BP 122/85 (BP Location: Left Arm, Patient Position: Sitting, Cuff Size: Large)   Pulse 72   Temp 98 F (36.7 C) (Oral)   Ht 5' 7" (1.702 m)   Wt 214 lb 9.6 oz (97.3 kg)   SpO2 97%   BMI 33.61 kg/m   BP/Weight 12/26/2015 11/28/2015 25/85/2778  Systolic BP 242 353 614  Diastolic BP 85 74 84  Wt. (Lbs) 214.6 - 217.4  BMI 33.61 - 34.05   Physical Exam  Constitutional: She is oriented to person, place, and time. She appears well-developed and well-nourished. No distress.  HENT:  Head: Normocephalic and atraumatic.  Cardiovascular: Normal rate, regular rhythm, normal heart sounds and  intact distal pulses.   Pulmonary/Chest: Effort normal and breath sounds normal.  Abdominal: Soft. Bowel sounds are normal. She exhibits no distension and no mass. There is generalized tenderness. There is no rebound and no guarding.  Obese   Musculoskeletal: She exhibits no edema.       Lumbar back: She exhibits decreased range of motion and tenderness. She exhibits no bony tenderness, no swelling, no edema, no deformity, no laceration, no pain, no spasm and normal pulse.  Neurological: She is alert and oriented to person, place, and time.  Skin: Skin is warm and dry. No rash noted.  Psychiatric: She has a normal mood and affect.   Lab Results  Component Value Date   HGBA1C 5.9 09/04/2015   CBG 110  Assessment & Plan:   Rachel Herman was seen today for back pain.  Diagnoses and all orders for this visit:  Type 2 diabetes  mellitus without complication, without long-term current use of insulin (HCC) -     POCT glucose (manual entry)  Lumbar pain with radiation down left leg -     diclofenac sodium (VOLTAREN) 1 % GEL; Apply 2 g topically 4 (four) times daily. -     traMADol (ULTRAM) 50 MG tablet; Take 1 tablet (50 mg total) by mouth every 8 (eight) hours as needed. -     Elastic Bandages & Supports (LUMBAR BACK BRACE/SUPPORT PAD) MISC; 1 each by Does not apply route daily.  Lumbar stenosis with neurogenic claudication -     diclofenac sodium (VOLTAREN) 1 % GEL; Apply 2 g topically 4 (four) times daily. -     Elastic Bandages & Supports (LUMBAR BACK BRACE/SUPPORT PAD) MISC; 1 each by Does not apply route daily.  Osteoarthritis of spine with radiculopathy, lumbar region -     traMADol (ULTRAM) 50 MG tablet; Take 1 tablet (50 mg total) by mouth every 8 (eight) hours as needed.  Neck pain on right side -     traMADol (ULTRAM) 50 MG tablet; Take 1 tablet (50 mg total) by mouth every 8 (eight) hours as needed.  Encounter for immunization -     Flu Vaccine QUAD 36+ mos IM    No orders of the defined types were placed in this encounter.   Follow-up: Return in about 2 months (around 02/25/2016) for back pain and abdominal pain .   Josalyn Funches MD   

## 2015-12-26 NOTE — Patient Instructions (Addendum)
Rachel Herman was seen today for back pain.  Diagnoses and all orders for this visit:  Type 2 diabetes mellitus without complication, without long-term current use of insulin (HCC) -     POCT glucose (manual entry)  Lumbar pain with radiation down left leg -     diclofenac sodium (VOLTAREN) 1 % GEL; Apply 2 g topically 4 (four) times daily. -     traMADol (ULTRAM) 50 MG tablet; Take 1 tablet (50 mg total) by mouth every 8 (eight) hours as needed. -     Elastic Bandages & Supports (LUMBAR BACK BRACE/SUPPORT PAD) MISC; 1 each by Does not apply route daily.  Lumbar stenosis with neurogenic claudication -     diclofenac sodium (VOLTAREN) 1 % GEL; Apply 2 g topically 4 (four) times daily. -     Elastic Bandages & Supports (LUMBAR BACK BRACE/SUPPORT PAD) MISC; 1 each by Does not apply route daily.  Osteoarthritis of spine with radiculopathy, lumbar region -     traMADol (ULTRAM) 50 MG tablet; Take 1 tablet (50 mg total) by mouth every 8 (eight) hours as needed.  Neck pain on right side -     traMADol (ULTRAM) 50 MG tablet; Take 1 tablet (50 mg total) by mouth every 8 (eight) hours as needed.   Have a safe and fun trip to [place]   F/u in 8 weeks for chronic pain in back and abdomen   Dr. Adrian Blackwater

## 2015-12-27 NOTE — Assessment & Plan Note (Addendum)
Chronic stable pain Continue tramadol Add voltaren gel  Rx for back brace

## 2016-01-06 ENCOUNTER — Other Ambulatory Visit: Payer: Self-pay | Admitting: Family Medicine

## 2016-01-06 DIAGNOSIS — R1084 Generalized abdominal pain: Secondary | ICD-10-CM

## 2016-01-11 ENCOUNTER — Other Ambulatory Visit (HOSPITAL_BASED_OUTPATIENT_CLINIC_OR_DEPARTMENT_OTHER): Payer: Medicare Other

## 2016-01-11 ENCOUNTER — Telehealth: Payer: Self-pay | Admitting: Hematology

## 2016-01-11 ENCOUNTER — Encounter: Payer: Self-pay | Admitting: Hematology

## 2016-01-11 ENCOUNTER — Ambulatory Visit (HOSPITAL_BASED_OUTPATIENT_CLINIC_OR_DEPARTMENT_OTHER): Payer: Medicare Other | Admitting: Hematology

## 2016-01-11 VITALS — BP 143/67 | HR 79 | Temp 98.2°F | Resp 17 | Ht 67.0 in | Wt 214.9 lb

## 2016-01-11 DIAGNOSIS — Z170 Estrogen receptor positive status [ER+]: Secondary | ICD-10-CM

## 2016-01-11 DIAGNOSIS — C50212 Malignant neoplasm of upper-inner quadrant of left female breast: Secondary | ICD-10-CM

## 2016-01-11 DIAGNOSIS — I152 Hypertension secondary to endocrine disorders: Secondary | ICD-10-CM

## 2016-01-11 DIAGNOSIS — Z79811 Long term (current) use of aromatase inhibitors: Secondary | ICD-10-CM

## 2016-01-11 DIAGNOSIS — R1013 Epigastric pain: Secondary | ICD-10-CM | POA: Diagnosis not present

## 2016-01-11 DIAGNOSIS — E041 Nontoxic single thyroid nodule: Secondary | ICD-10-CM

## 2016-01-11 DIAGNOSIS — Z6841 Body Mass Index (BMI) 40.0 and over, adult: Secondary | ICD-10-CM

## 2016-01-11 DIAGNOSIS — M48062 Spinal stenosis, lumbar region with neurogenic claudication: Secondary | ICD-10-CM

## 2016-01-11 DIAGNOSIS — R911 Solitary pulmonary nodule: Secondary | ICD-10-CM

## 2016-01-11 LAB — CBC WITH DIFFERENTIAL/PLATELET
BASO%: 1 % (ref 0.0–2.0)
Basophils Absolute: 0.1 10*3/uL (ref 0.0–0.1)
EOS%: 2.5 % (ref 0.0–7.0)
Eosinophils Absolute: 0.2 10*3/uL (ref 0.0–0.5)
HCT: 39.5 % (ref 34.8–46.6)
HGB: 13.1 g/dL (ref 11.6–15.9)
LYMPH%: 25.3 % (ref 14.0–49.7)
MCH: 28.6 pg (ref 25.1–34.0)
MCHC: 33 g/dL (ref 31.5–36.0)
MCV: 86.6 fL (ref 79.5–101.0)
MONO#: 0.5 10*3/uL (ref 0.1–0.9)
MONO%: 6.7 % (ref 0.0–14.0)
NEUT#: 5.2 10*3/uL (ref 1.5–6.5)
NEUT%: 64.5 % (ref 38.4–76.8)
Platelets: 280 10*3/uL (ref 145–400)
RBC: 4.56 10*6/uL (ref 3.70–5.45)
RDW: 14.5 % (ref 11.2–14.5)
WBC: 8 10*3/uL (ref 3.9–10.3)
lymph#: 2 10*3/uL (ref 0.9–3.3)

## 2016-01-11 LAB — COMPREHENSIVE METABOLIC PANEL
ALT: 26 U/L (ref 0–55)
AST: 21 U/L (ref 5–34)
Albumin: 3.5 g/dL (ref 3.5–5.0)
Alkaline Phosphatase: 68 U/L (ref 40–150)
Anion Gap: 10 mEq/L (ref 3–11)
BUN: 24 mg/dL (ref 7.0–26.0)
CO2: 28 mEq/L (ref 22–29)
Calcium: 10 mg/dL (ref 8.4–10.4)
Chloride: 107 mEq/L (ref 98–109)
Creatinine: 1 mg/dL (ref 0.6–1.1)
EGFR: 57 mL/min/{1.73_m2} — ABNORMAL LOW (ref >90)
Glucose: 109 mg/dl (ref 70–140)
Potassium: 4.3 mEq/L (ref 3.5–5.1)
Sodium: 145 mEq/L (ref 136–145)
Total Bilirubin: 0.23 mg/dL (ref 0.20–1.20)
Total Protein: 7.5 g/dL (ref 6.4–8.3)

## 2016-01-11 NOTE — Telephone Encounter (Signed)
Appointments scheduled per 12/7 LOS. Patient given AVS report and calendars with future scheduled appointments.  °

## 2016-01-11 NOTE — Progress Notes (Signed)
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 Ultrasound of the left axilla is negative.         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       12/29/2013 Surgery    Left lumpectomy with close (<0.1cm) inferior margin      03/02/2014 Surgery    Reexcision for close margin, path negative for malignancy.      04/05/2014 - 05/20/2014 Radiation Therapy    adjuvant breast radiation       05/27/2014 Imaging    Bone density scan: normal       06/07/2015 -  Anti-estrogen oral therapy    Exemestane 25 mg daily      CURRENT THERAPY: Exemestane 27m daily, started on 06/07/2014                                    INTERIM HISTORY: She returns for follow up. She  Is accompanied by her husband to the clinic today. She is doing moderately well overall. She still has intermittent epigastric pain, which is usually triggered by standing, or  bending when she works I in the yard, a usually lasts about few days, and resolves spontaneously. She has been seen her primary care physician Dr. WStephanie Acre had abdominal ultrasound, lumbar and cervical spine MRI, etc, the exact cause of her pain is still unclear.  She denies any other new pain, she has mild to moderate fatigue, able to function well at home, her appetite is chronically low, she eats small meals, weight is stable, no other new complaints.  MEDICAL HISTORY:  Past Medical History:  Diagnosis Date  . [place] 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  . [place] 09/16/13   Diagnosed on 09/16/13; HgA1C was 7.2/metformin  . Dizziness   . GERD (gastroesophageal reflux disease)   . Headache   . Hyperlipidemia   . Hypertension 2014  . Low back pain   . Obesity, morbid, BMI 40.0-49.9 ([place]   . PONV (postoperative nausea and vomiting)   . S/P radiation therapy 04/05/14-05/20/14   left breast 60.4Gy totaldose    SURGICAL HISTORY: Past Surgical History:  Procedure Laterality Date  . ABDOMINAL HYSTERECTOMY N/A 09/20/2013   Procedure: HYSTERECTOMY ABDOMINAL;  Surgeon: UOsborne Oman MD;  [facility];  Service: Gynecology;  Laterality: N/A;  . BREAST LUMPECTOMY WITH AXILLARY LYMPH NODE BIOPSY Left 12/29/13   left breast   . LAPAROSCOPIC ASSISTED VAGINAL HYSTERECTOMY  09/20/2013   Procedure: LAPAROSCOPIC ASSISTED VAGINAL HYSTERECTOMY;  Surgeon: Osborne Oman, MD;  Location: [facility];  Service: Gynecology;;  PT WAS EXAMINED WHILE UP IN [place] AND IT WAS DECIDED TO OPEN PT DUE TO LARGE MASS  . LUMBAR LAMINECTOMY/DECOMPRESSION MICRODISCECTOMY Right 12/14/2014   Procedure: Right Lumbar three-four microdiskectomy;  Surgeon: Newman Pies, MD;  Location: [facility];  Service: Neurosurgery;  Laterality: Right;  Right Lumbar three-four microdiskectomy  . RADIOACTIVE SEED GUIDED MASTECTOMY WITH AXILLARY SENTINEL LYMPH NODE BIOPSY Left 12/29/2013    Procedure: LEFT BREAST RADIOACTIVE SEED LOCALIZED LUMPECTOMY WITH SENTINEL LYMPH NODE MAPPING;  Surgeon: Erroll Luna, MD;  Location: [facility];  Service: General;  Laterality: Left;  . RE-EXCISION OF BREAST LUMPECTOMY Left 03/02/2014   Procedure: RE-EXCISION OF LEFT BREAST LUMPECTOMY;  Surgeon: Erroll Luna, MD;  Location: [facility];  Service: General;  Laterality: Left;  . SALPINGOOPHORECTOMY  09/20/2013   Procedure: SALPINGO OOPHORECTOMY;  Surgeon: Osborne Oman, MD;  Location: [facility];  Service: Gynecology;;    SOCIAL HISTORY: Social History   Social History  . Marital status: Married    Spouse name: Valaree Fresquez   . Number of children: 3  . Years of education: 12   Occupational History  .  Unemployed   Social History Main Topics  . Smoking status: Never Smoker  . Smokeless tobacco: Never Used  . Alcohol use No  . Drug use: No  . Sexual activity: Not Currently    Birth control/ protection: Post-menopausal   Other Topics Concern  . Not on file   Social History Narrative   Married to [employer] in 1986.    Has 3 children from previous marriage, 1 in [place], 1 in [place], 1 in prison ([place]).    Lives with husband.    Right-handed.   1 cup caffeine per day.       FAMILY HISTORY: Family History  Problem Relation Age of Onset  . Diabetes Mother   . Multiple myeloma Mother 2  . Hearing loss Mother   . Diabetes Brother   . Cancer Brother 40    prostate cancer   . Diabetes Maternal Aunt   . Leukemia Maternal Aunt     dx in her 74s  . Alcoholism Brother   . Heart attack Father   . Diabetes Sister   . Diabetes Son     ALLERGIES:  has No Known Allergies.  MEDICATIONS:  Current Outpatient Prescriptions  Medication Sig Dispense Refill  . Ascorbic Acid (VITAMIN C) 1000 MG tablet Take 1,000 mg by mouth daily.    Marland Kitchen aspirin 325 MG tablet Take 325 mg by mouth once.    . Blood Glucose Monitoring Suppl (TRUE METRIX  METER) w/Device KIT 1 each by Does not apply route as needed. 1 kit 0  . [employer] SENNA PLUS 8.6-50 MG tablet TAKE 2 TABLETS BY MOUTH AT BEDTIME AS NEEDED FOR MILD CONSTIPATION  11  . cyclobenzaprine (FLEXERIL) 10 MG tablet Take 1 tablet (10 mg total) by mouth 3 (three) times daily as needed for muscle spasms. 30 tablet 0  . diclofenac sodium (VOLTAREN) 1 % GEL Apply 2 g topically 4 (four) times daily. 100 g 2  . Elastic Bandages & Supports (LUMBAR BACK BRACE/SUPPORT PAD) MISC 1 each by Does not apply  route daily. 1 each 0  . exemestane (AROMASIN) 25 MG tablet Take 1 tablet (25 mg total) by mouth daily after breakfast. 90 tablet 3  . gabapentin (NEURONTIN) 300 MG capsule 300 mg during the day, 600 mg at night by mouth 270 capsule 4  . glucose blood test strip Use as instructed 100 each 12  . guaiFENesin-codeine (CHERATUSSIN AC) 100-10 MG/5ML syrup Take 5 mLs by mouth 3 (three) times daily as needed for cough. 120 mL 0  . hydrochlorothiazide (HYDRODIURIL) 25 MG tablet Take 1 tablet (25 mg total) by mouth daily. 90 tablet 0  . Lancets (ACCU-CHEK MULTICLIX) lancets 1 each by Other route 3 (three) times daily. ICD 10 E11.9 100 each 12  . lisinopril (PRINIVIL,ZESTRIL) 20 MG tablet Take 1 tablet (20 mg total) by mouth daily. 90 tablet 2  . metFORMIN (GLUCOPHAGE) 500 MG tablet Take 1 tablet (500 mg total) by mouth 2 (two) times daily. 180 tablet 3  . Multiple Vitamins-Minerals (MULTIVITAMIN WITH MINERALS) tablet Take 1 tablet by mouth daily. Reported on 03/13/2015    . ranitidine (ZANTAC) 150 MG tablet TAKE 1 TABLET BY MOUTH TWICE A DAY 60 tablet 2  . traMADol (ULTRAM) 50 MG tablet Take 1 tablet (50 mg total) by mouth every 8 (eight) hours as needed. 90 tablet 2  . vitamin D, CHOLECALCIFEROL, 400 UNITS tablet Take 400 Units by mouth daily.     No current facility-administered medications for this visit.     REVIEW OF SYSTEMS:   Constitutional: Denies fevers, chills or abnormal night sweats, (+)   fatigue Eyes: (+) blurriness of vision, no double vision or watery eyes Ears, nose, mouth, throat, and face: Denies mucositis or sore throat Respiratory: (+) dyspnea on exertion, Denies cough or wheezes Cardiovascular: Denies palpitation, chest discomfort or lower extremity swelling Gastrointestinal:  Denies nausea, heartburn or change in bowel habits Skin: Denies abnormal skin rashes Lymphatics: Denies new lymphadenopathy or easy bruising Neurological:Denies numbness, tingling or new weaknesses MSK: (+)  Bilateral rib cage pain, mid thoracic pain and diffuse arthralgia Behavioral/Psych: Mood is stable, no new changes  All other systems were reviewed with the patient and are negative.  PHYSICAL EXAMINATION: ECOG PERFORMANCE STATUS: 2  BP (!) 143/67 (BP Location: Left Arm, Patient Position: Sitting)   Pulse 79   Temp 98.2 F (36.8 C) (Oral)   Resp 17   Ht _0  (1.702 m)   Wt 214 lb 14.4 oz (97.5 kg)   SpO2 100%   BMI 33.66 kg/m   GENERAL:alert, no distress and comfortable SKIN: skin color, texture, turgor are normal, no rashes or significant lesions EYES: normal, conjunctiva are pink and non-injected, sclera clear OROPHARYNX:no exudate, no erythema and lips, buccal mucosa, and tongue normal  NECK: supple, thyroid normal size, non-tender, without nodularity LYMPH:  no palpable lymphadenopathy in the cervical, axillary or inguinal LUNGS: clear to auscultation and percussion with normal breathing effort HEART: regular rate & rhythm and no murmurs and no lower extremity edema ABDOMEN:abdomen soft, nontender, no rebound pain normal bowel sounds Musculoskeletal:no cyanosis of digits and no clubbing, (+)  Tenderness at the bilateral low rib cage, in the mid spine. PSYCH: alert & oriented x 3 with fluent speech NEURO: no focal motor/sensory deficits Breasts: Breast inspection showed them to be symmetrical with no nipple discharge. Surgical scar at the left upper breast and axillary area  are well healed. Palpation of the right breasts and axilla revealed no obvious mass that I could appreciate.   I have  reviewed the data as listed LABORATORY DATA:  CBC Latest Ref Rng & Units 01/11/2016 09/11/2015 08/17/2015  WBC 3.9 - 10.3 10e3/uL 8.0 8.0 10.0  Hemoglobin 11.6 - 15.9 g/dL 13.1 12.5 12.6  Hematocrit 34.8 - 46.6 % 39.5 38.9 40.4  Platelets 145 - 400 10e3/uL 280 250 296    CMP Latest Ref Rng & Units 01/11/2016 09/11/2015 09/04/2015  Glucose 70 - 140 mg/dl 109 93 -  BUN 7.0 - 26.0 mg/dL 24.0 27.6(H) -  Creatinine 0.6 - 1.1 mg/dL 1.0 1.3(H) -  Sodium 136 - 145 mEq/L 145 144 -  Potassium 3.5 - 5.1 mEq/L 4.3 4.0 -  Chloride 101 - 111 mmol/L - - -  CO2 22 - 29 mEq/L 28 26 -  Calcium 8.4 - 10.4 mg/dL 10.0 10.0 -  Total Protein 6.4 - 8.3 g/dL 7.5 7.7 7.0  Total Bilirubin 0.20 - 1.20 mg/dL 0.23 0.32 0.3  Alkaline Phos 40 - 150 U/L 68 54 50  AST 5 - 34 U/L _0 ALT 0 - 55 U/L 26 34 29     PATHOLOGY REPORT Diagnosis 12/29/2013 1. Breast, lumpectomy, left - INVASIVE GRADE II DUCTAL CARCINOMA, SPANNING 1.7 CM IN GREATEST DIMENSION. - SECOND FOCUS OF INVASIVE GRADE I DUCTAL CARCINOMA, SPANNING 0.5 CM IN GREATEST DIMENSION. - INTERMEDIATE GRADE DUCTAL CARCINOMA IN SITU ASSOCIATED WITH BOTH FOCI OF TUMOR. - INVASIVE DUCTAL CARCINOMA IS EXTREMELY CLOSE (LESS THAN 0.1 CM) TO INFERIOR MARGIN. - DUCTAL CARCINOMA IN SITU IS CLOSE (0.1 CM) TO INFERIOR MARGIN. - OTHER MARGINS NEGATIVE. - SEE ONCOLOGY TEMPLATE. 2. Lymph node, sentinel, biopsy, Left axillary 1 of 4 FINAL for JASHAY, RODDY ([id number]) Diagnosis(continued) - ONE BENIGN LYMPH NODE WITH NO TUMOR SEEN (0/1). 3. Lymph node, sentinel, biopsy, Left axilla - ONE BENIGN LYMPH NODE WITH NO TUMOR SEEN (0/1). Microscopic Comment 1. BREAST, INVASIVE TUMOR, WITH LYMPH NODES PRESENT Specimen, including laterality and lymph node sampling (sentinel, non-sentinel): Left partial breast with left sentinel lymph node  sampling. Procedure: Left breast lumpectomy with sentinel lymph node biopsies. Histologic type: Invasive ductal carcinoma. Larger focus: Grade: II. Tubule formation: 2. Nuclear pleomorphism: 2. Mitotic: 3. Smaller focus (near inferior margin): Grade: I. Tubule formation: 1. Nuclear pleomorphism: 2. Mitotic: 1. Tumor sizes (gross and glass slide measurement): 1.7 cm and 0.5 cm. Margins: Invasive, distance to closest margin: less than 0.1 cm (inferior margin). In-situ, distance to closest margin: 0.1 cm (inferior margin). Lymphovascular invasion: Definitive lymphovascular invasion is not identified. Ductal carcinoma in situ: Yes. Grade: Intermediate grade. Extensive intraductal component: There is an extensive intraductal component of ductal carcinoma in situ on the smaller focus but not on the larger focus. Lobular neoplasia: No. Tumor focality: Two foci, multifocal. Treatment effect: N/A. Extent of tumor: Tumor confined to breast parenchyma. Skin: Not received. Nipple: Not received. Skeletal muscle: Not received. Lymph nodes: Examined: 2 Sentinel. 0 Non-sentinel. 2 Total. Lymph nodes with metastasis: 0. Breast prognostic profile: Performed on previous case ([id number]). Estrogen receptor: 100%, positive. Progesterone receptor: 96%, positive. Her 2 neu by CISH: 0.81 ratio, no amplification. Ki-67: 73%. Non-neoplastic breast: Fat necrosis. TNM: pT1c(m), pN0, MX. Comments: A smooth muscle myosin, calponin, and p63 immunohistochemical stain are performed on a single block, which helps to confirm the presence of a second focus of invasive ductal carcinoma associated 2 of 4 FINAL for MYLAH, BAYNES ([id number]) Microscopic Comment(continued) with ductal carcinoma in situ. As Her-2 neu by CISH was negative on the initial biopsy, this will be repeated on the  larger tumor focus and reported in an addendum to follow. A breast prognostic profile will not be performed on  the smaller tumor focus unless otherwise requested (the tumor foci although different grades show morphologically similar nuclear features). (RH:ds 12/31/13)  Oncotype DX Score: 23 (Intermediate risk) (ER+/PR+/HER2-)   Diagnosis 05/09/2015 THYROID, FINE NEEDLE ASPIRATION, RLP (SPECIMEN 1 OF 2 COLLECTED 05-09-2015) CONSISTENT WITH BENIGN FOLLICULAR NODULE (BETHESDA CATEGORY II).  Diagnosis 05/09/2015 THYROID, FINE NEEDLE ASPIRATION, LMP (SPECIMEN 2 OF 2 COLLECTED 05-09-2015) CONSISTENT WITH BENIGN FOLLICULAR NODULE (BETHESDA CATEGORY II).   RADIOGRAPHIC STUDIES: I have personally reviewed the radiological images as listed and agreed with the findings in the report.  Mammogram 02/14/2015 IMPRESSION: No findings worrisome for recurrent tumor or developing malignancy.  RECOMMENDATION: Bilateral diagnostic mammography in 1 year.   US SOFT TISSUE HEAD AND NECK 04/03/2015 IMPRESSION: Multi bilateral nodules. Largest on the right measures 19 mm. Largest on left measures 21 mm. Findings meet consensus criteria for biopsy. Ultrasound-guided fine needle aspiration should be considered, as per the consensus statement: Management of Thyroid Nodules Detected at [place]: Society of Radiologists in [place]. Radiology 2005; [id number].  Bone scan 06/20/2015 IMPRESSION: No evidence of metastatic disease. Mild increased activity dorsal aspect right midfoot probable degenerative in nature. Clinical correlation is necessary.   ASSESSMENT & PLAN:  65 year old Caucasian female, with past medical history of hypertension, diabetes, dyslipidemia, obesity who was found to have a left breast mass by screening mammogram.  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 Grade 2, Ki-67 73%. -She is status post complete surgical resection with lumpectomy. -Her breast cancer is likely cured  by surgery alone, but does has some risk of disease recurrence in the future. -I discussed the Oncotype DX result with her and her husband. The recurrence score is 23, with the predicted 10 year recurrence risk of 15% with tamoxifen alone. The benefit of chemotherapy is uncertain in this intermediate risk group. I do not strongly recommend adjuvant chemotherapy. Patient agree with not having chemotherapy.  -Due to the strong positivity of ER and PR, I recommend adjuvant  Aromasin for total 5 years -She has been tolerating aromasin well, will continue for a total of 5 years  - due to the new onset bilateral rib pain and midthoracic pain in 06/2015, I ordered a bone scan which was negative for bone metastasis. - lab reviewed with her, CBC and CMP are unremarkable. Her exam today was normal.  Her last mammogram in 02/2015 was normal. No evidence of recurrence. - she will continue  Surveillance, she is due for mammogram next month - I encouraged her to eat healthy, try to be more physically active, and try to lose some weight.  #2 Bone health -Her recent bone density scan was normal in 05/2014, will repeat in April 2018 -We discussed Aromasin may weak her bone, she will continue calcium and vitamin D.  #3. hypertension, diabetes, dyslipidemia, chronic low back pain -She will continue follow-up with her primary care physician.  #4 Morbid obesity -We discussed healthy diet and regular exercise. She is willing to try after she recovers well from her back surgery  #5 right lung nodule -She had a CT abdomen and pelvis in June 2016, which incidentally found a 4 mm nodule in the right lower lobe. I personally reviewed the image.  -She never smoked, risk of lung cancer is low  -her repeated CT chest from  12/09/2014, which showed stable small lung nodules, likely benign. -she had CT chest on 08/18/2015 for chest pain, which showed stable 1m right lung base subpleural nodule, likely benign, no need to follow-up  since she is a never smoker   #6. Thyroid  Benign follicular nodule -Her CT scan revealed a 1.7 cm right thyroid nodule. - ultrasound guided thyroid nodule biopsy showed benign follicular nodule   #7 epigastric pain -Uncertain etiology. She has been following up with her primary care physician -unlikely related to exemestane  PLAN: -continue exemastane. -Bilateral diagnostic mammogram in 02/2016 --Return to clinic in 4 months with lab. If she is doing well on next visit, will see her every 6 month after next visit   All questions were answered. The patient knows to call the clinic with any problems, questions or concerns. I spent 25 minutes counseling the patient face to face. The total time spent in the appointment was 30 minutes and more than 50% was on counseling.     FTruitt Merle MD 01/11/2016 1:52 PM

## 2016-01-25 ENCOUNTER — Other Ambulatory Visit: Payer: Self-pay | Admitting: Family Medicine

## 2016-01-25 DIAGNOSIS — I152 Hypertension secondary to endocrine disorders: Secondary | ICD-10-CM

## 2016-02-26 ENCOUNTER — Ambulatory Visit: Payer: Medicare Other | Attending: Family Medicine | Admitting: Family Medicine

## 2016-02-26 ENCOUNTER — Encounter: Payer: Self-pay | Admitting: Family Medicine

## 2016-02-26 VITALS — BP 120/84 | HR 76 | Temp 98.1°F | Ht 67.0 in | Wt 214.8 lb

## 2016-02-26 DIAGNOSIS — M542 Cervicalgia: Secondary | ICD-10-CM | POA: Diagnosis not present

## 2016-02-26 DIAGNOSIS — E119 Type 2 diabetes mellitus without complications: Secondary | ICD-10-CM | POA: Insufficient documentation

## 2016-02-26 DIAGNOSIS — R109 Unspecified abdominal pain: Secondary | ICD-10-CM | POA: Diagnosis not present

## 2016-02-26 DIAGNOSIS — Z79899 Other long term (current) drug therapy: Secondary | ICD-10-CM | POA: Diagnosis not present

## 2016-02-26 DIAGNOSIS — R1084 Generalized abdominal pain: Secondary | ICD-10-CM | POA: Diagnosis not present

## 2016-02-26 DIAGNOSIS — Z7982 Long term (current) use of aspirin: Secondary | ICD-10-CM | POA: Diagnosis not present

## 2016-02-26 DIAGNOSIS — Z853 Personal history of malignant neoplasm of breast: Secondary | ICD-10-CM | POA: Diagnosis not present

## 2016-02-26 DIAGNOSIS — M549 Dorsalgia, unspecified: Secondary | ICD-10-CM | POA: Diagnosis not present

## 2016-02-26 DIAGNOSIS — M545 Low back pain, unspecified: Secondary | ICD-10-CM

## 2016-02-26 DIAGNOSIS — G8929 Other chronic pain: Secondary | ICD-10-CM | POA: Insufficient documentation

## 2016-02-26 DIAGNOSIS — Z7984 Long term (current) use of oral hypoglycemic drugs: Secondary | ICD-10-CM | POA: Diagnosis not present

## 2016-02-26 DIAGNOSIS — A048 Other specified bacterial intestinal infections: Secondary | ICD-10-CM

## 2016-02-26 DIAGNOSIS — M4726 Other spondylosis with radiculopathy, lumbar region: Secondary | ICD-10-CM | POA: Diagnosis not present

## 2016-02-26 DIAGNOSIS — M79605 Pain in left leg: Secondary | ICD-10-CM

## 2016-02-26 DIAGNOSIS — R0782 Intercostal pain: Secondary | ICD-10-CM

## 2016-02-26 DIAGNOSIS — R059 Cough, unspecified: Secondary | ICD-10-CM

## 2016-02-26 DIAGNOSIS — R05 Cough: Secondary | ICD-10-CM

## 2016-02-26 LAB — POCT GLYCOSYLATED HEMOGLOBIN (HGB A1C): Hemoglobin A1C: 5.9

## 2016-02-26 LAB — GLUCOSE, POCT (MANUAL RESULT ENTRY): POC Glucose: 99 mg/dl (ref 70–99)

## 2016-02-26 MED ORDER — GUAIFENESIN-CODEINE 100-10 MG/5ML PO SYRP: 5.0000 mL | ORAL_SOLUTION | Freq: Three times a day (TID) | ORAL | 0 refills | Status: DC | PRN

## 2016-02-26 MED ORDER — TRAMADOL HCL 50 MG PO TABS: 50.0000 mg | ORAL_TABLET | Freq: Three times a day (TID) | ORAL | 2 refills | Status: DC | PRN

## 2016-02-26 NOTE — Patient Instructions (Addendum)
Rachel Herman was seen today for back pain and abdominal pain.  Diagnoses and all orders for this visit:  Type 2 diabetes mellitus without complication, without long-term current use of insulin (HCC) -     POCT glucose (manual entry) -     POCT glycosylated hemoglobin (Hb A1C)  Lumbar pain with radiation down left leg -     traMADol (ULTRAM) 50 MG tablet; Take 1 tablet (50 mg total) by mouth every 8 (eight) hours as needed.  Osteoarthritis of spine with radiculopathy, lumbar region -     traMADol (ULTRAM) 50 MG tablet; Take 1 tablet (50 mg total) by mouth every 8 (eight) hours as needed.  Neck pain on right side -     traMADol (ULTRAM) 50 MG tablet; Take 1 tablet (50 mg total) by mouth every 8 (eight) hours as needed.  Generalized abdominal pain -     H. pylori breath test  Intercostal pain -     DG Chest 2 View; Future  Cough -     guaiFENesin-codeine (CHERATUSSIN AC) 100-10 MG/5ML syrup; Take 5 mLs by mouth 3 (three) times daily as needed for cough.   Please complete x-ray  F/u in 3 months for diabetes  Dr. Adrian Blackwater

## 2016-02-26 NOTE — Assessment & Plan Note (Signed)
Chronic pain No red flags Breath test today Continue tramadol and H2 blocker

## 2016-02-26 NOTE — Progress Notes (Signed)
Subjective:  Patient ID: [name], female    DOB: 1950-06-19  Age: 66 y.o. MRN: [id number]  CC: Back Pain and Abdominal Pain   HPI Dejai DONALEE GAUMOND has diabetes, hx of breast cancer, chronic low back pain she  presents for    1. Abdominal pain and back pain:  patient with 5 months of intermittent epigastric pain and bilateral pain. Sharp pains. Pain associated with L sided and low back pains.  No associated fever, no vomiting. There is  nausea. She went to ED on 08/18/15 for severe episode of pain. Trop neg x 2, EKG normal, CXR normal. CTA negative for PE. CT abdomen and pelvis negative on 07/20/15. She is still having similar pains but not as severe. Pain last 2-3 minutes at time. Occurs once every 2-3 days. When she twist her trunk she also gets similar pains and soreness in her flanks. She has a c-scope with polypectomy on 03/30/15, tubular adenomas and hyperplastic polyps recommended repeat c-scope in 3 years.   She did two week trial of of metformin without no changes. She still has nausea. No vomiting. Epigastric pain is 6/10. Back pain is 7/10.   Abdominal ultrasound 09/07/2015 IMPRESSION: 1. Probable non shadowing 8 mm diameter gallstone. No sonographic evidence of acute cholecystitis. 2. Fatty infiltrative change of the liver. Limited visualization of the pancreatic tail. 3. No acute abnormality observed within the abdomen. 2. Chronic back pain: she is having low back and L sided back pain that is worsening. She is s/p lumbar laminectomy/decompression in 12/2014. She had a repeat lumbar MRI on 11/23/15: IMPRESSION:  Equivocal MRI lumbar spine (without) demonstrating: 1. Mild disc bulging at L2-3 and L3-4. 2. No spinal stenosis or foraminal narrowing.  3. Compared to MRI from 11/11/14, the disc protrusion at L3-4 has been surgically treated.    2. Lower chest pains: x 3 days. Worse on R side but b/l. Also with productive cough, congestion and headache. No fever, chills,  nausea, vomiting or shortness of breath.   3. Diabetes: compliant with metformin. No low CBGs. Desires to continue metformin.   Past Surgical History:  Procedure Laterality Date  . ABDOMINAL HYSTERECTOMY N/A 09/20/2013   Procedure: HYSTERECTOMY ABDOMINAL;  Surgeon: Osborne Oman, MD;  Location: [facility];  Service: Gynecology;  Laterality: N/A;  . BREAST LUMPECTOMY WITH AXILLARY LYMPH NODE BIOPSY Left 12/29/13   left breast   . LAPAROSCOPIC ASSISTED VAGINAL HYSTERECTOMY  09/20/2013   Procedure: LAPAROSCOPIC ASSISTED VAGINAL HYSTERECTOMY;  Surgeon: Osborne Oman, MD;  Location: [facility];  Service: Gynecology;;  PT WAS EXAMINED WHILE UP IN [place] AND IT WAS DECIDED TO OPEN PT DUE TO LARGE MASS  . LUMBAR LAMINECTOMY/DECOMPRESSION MICRODISCECTOMY Right 12/14/2014   Procedure: Right Lumbar three-four microdiskectomy;  Surgeon: Newman Pies, MD;  Location: [facility];  Service: Neurosurgery;  Laterality: Right;  Right Lumbar three-four microdiskectomy  . RADIOACTIVE SEED GUIDED MASTECTOMY WITH AXILLARY SENTINEL LYMPH NODE BIOPSY Left 12/29/2013   Procedure: LEFT BREAST RADIOACTIVE SEED LOCALIZED LUMPECTOMY WITH SENTINEL LYMPH NODE MAPPING;  Surgeon: Erroll Luna, MD;  Location: [facility];  Service: General;  Laterality: Left;  . RE-EXCISION OF BREAST LUMPECTOMY Left 03/02/2014   Procedure: RE-EXCISION OF LEFT BREAST LUMPECTOMY;  Surgeon: Erroll Luna, MD;  Location: [facility];  Service: General;  Laterality: Left;  . SALPINGOOPHORECTOMY  09/20/2013   Procedure: SALPINGO OOPHORECTOMY;  Surgeon: Osborne Oman, MD;  Location: [facility];  Service: Gynecology;;   Social History  Substance Use Topics  . Smoking status: Never Smoker  . Smokeless tobacco: Never Used  . Alcohol use No    Outpatient Medications Prior to Visit  Medication Sig Dispense Refill  . Ascorbic Acid (VITAMIN C) 1000 MG tablet Take 1,000 mg by mouth daily.    Marland Kitchen aspirin 325 MG tablet Take  325 mg by mouth once.    . Blood Glucose Monitoring Suppl (TRUE METRIX METER) w/Device KIT 1 each by Does not apply route as needed. 1 kit 0  . [employer] SENNA PLUS 8.6-50 MG tablet TAKE 2 TABLETS BY MOUTH AT BEDTIME AS NEEDED FOR MILD CONSTIPATION  11  . cyclobenzaprine (FLEXERIL) 10 MG tablet Take 1 tablet (10 mg total) by mouth 3 (three) times daily as needed for muscle spasms. 30 tablet 0  . diclofenac sodium (VOLTAREN) 1 % GEL Apply 2 g topically 4 (four) times daily. 100 g 2  . Elastic Bandages & Supports (LUMBAR BACK BRACE/SUPPORT PAD) MISC 1 each by Does not apply route daily. 1 each 0  . exemestane (AROMASIN) 25 MG tablet Take 1 tablet (25 mg total) by mouth daily after breakfast. 90 tablet 3  . gabapentin (NEURONTIN) 300 MG capsule 300 mg during the day, 600 mg at night by mouth 270 capsule 4  . glucose blood test strip Use as instructed 100 each 12  . guaiFENesin-codeine (CHERATUSSIN AC) 100-10 MG/5ML syrup Take 5 mLs by mouth 3 (three) times daily as needed for cough. 120 mL 0  . hydrochlorothiazide (HYDRODIURIL) 25 MG tablet TAKE 1 TABLET BY MOUTH EVERY DAY 90 tablet 0  . Lancets (ACCU-CHEK MULTICLIX) lancets 1 each by Other route 3 (three) times daily. ICD 10 E11.9 100 each 12  . lisinopril (PRINIVIL,ZESTRIL) 20 MG tablet Take 1 tablet (20 mg total) by mouth daily. 90 tablet 2  . metFORMIN (GLUCOPHAGE) 500 MG tablet Take 1 tablet (500 mg total) by mouth 2 (two) times daily. 180 tablet 3  . Multiple Vitamins-Minerals (MULTIVITAMIN WITH MINERALS) tablet Take 1 tablet by mouth daily. Reported on 03/13/2015    . ranitidine (ZANTAC) 150 MG tablet TAKE 1 TABLET BY MOUTH TWICE A DAY 60 tablet 2  . traMADol (ULTRAM) 50 MG tablet Take 1 tablet (50 mg total) by mouth every 8 (eight) hours as needed. 90 tablet 2  . vitamin D, CHOLECALCIFEROL, 400 UNITS tablet Take 400 Units by mouth daily.     No facility-administered medications prior to visit.     ROS Review of Systems  Constitutional: Negative  for chills and fever.  HENT: Positive for congestion.   Eyes: Negative for visual disturbance.  Respiratory: Positive for cough. Negative for shortness of breath.   Cardiovascular: Negative for chest pain.  Gastrointestinal: Positive for abdominal pain. Negative for abdominal distention, anal bleeding, blood in stool, constipation, diarrhea, nausea, rectal pain and vomiting.  Musculoskeletal: Positive for arthralgias, back pain, myalgias and neck pain.  Skin: Negative for rash.  Allergic/Immunologic: Negative for immunocompromised state.  Neurological: Positive for headaches. Negative for dizziness.  Hematological: Negative for adenopathy. Does not bruise/bleed easily.  Psychiatric/Behavioral: Negative for dysphoric mood and suicidal ideas.   Lab Results  Component Value Date   HGBA1C 5.9 09/04/2015   CBG 94  Objective:  BP 120/84 (BP Location: Left Arm, Patient Position: Sitting, Cuff Size: Small)   Pulse 76   Temp 98.1 F (36.7 C) (Oral)   Ht '5\' 7"'  (1.702 m)   Wt 214 lb 12.8 oz (97.4 kg)   SpO2 97%  BMI 33.64 kg/m   BP/Weight 02/26/2016 01/11/2016 47/82/9562  Systolic BP 130 865 784  Diastolic BP 84 67 85  Wt. (Lbs) 214.8 214.9 214.6  BMI 33.64 33.66 33.61   Physical Exam  Constitutional: She is oriented to person, place, and time. She appears well-developed and well-nourished. No distress.  HENT:  Head: Normocephalic and atraumatic.  Cardiovascular: Normal rate, regular rhythm, normal heart sounds and intact distal pulses.   Pulmonary/Chest: Effort normal and breath sounds normal.  Abdominal: Soft. Bowel sounds are normal. She exhibits no distension and no mass. There is generalized tenderness. There is no rebound and no guarding.  Obese   Musculoskeletal: She exhibits no edema.       Lumbar back: She exhibits decreased range of motion and tenderness. She exhibits no bony tenderness, no swelling, no edema, no deformity, no laceration, no pain, no spasm and normal pulse.    Neurological: She is alert and oriented to person, place, and time.  Skin: Skin is warm and dry. No rash noted.  Psychiatric: She has a normal mood and affect.   Lab Results  Component Value Date   HGBA1C 5.9 02/26/2016   CBG 99  Assessment & Plan:   Kelleen was seen today for back pain and abdominal pain.  Diagnoses and all orders for this visit:  Type 2 diabetes mellitus without complication, without long-term current use of insulin (HCC) -     POCT glucose (manual entry) -     POCT glycosylated hemoglobin (Hb A1C)    No orders of the defined types were placed in this encounter.   Follow-up: No Follow-up on file.   Boykin Nearing MD

## 2016-02-26 NOTE — Progress Notes (Signed)
Pt is here for back and abdominal pain. Pt states that she is having pain under breast on both sides.

## 2016-02-26 NOTE — Assessment & Plan Note (Signed)
Well-controlled.  Continue current regimen. 

## 2016-02-26 NOTE — Assessment & Plan Note (Signed)
Chest pains with cough Normal, non acute exam  Planb: CXR Refilled cough medicine

## 2016-02-27 DIAGNOSIS — A048 Other specified bacterial intestinal infections: Secondary | ICD-10-CM | POA: Insufficient documentation

## 2016-02-27 LAB — H. PYLORI BREATH TEST: H. pylori Breath Test: DETECTED — AB

## 2016-02-27 MED ORDER — LEVOFLOXACIN 500 MG PO TABS: 500.0000 mg | ORAL_TABLET | Freq: Every day | ORAL | 0 refills | Status: DC

## 2016-02-27 MED ORDER — AMOXICILLIN 500 MG PO CAPS: 1000.0000 mg | ORAL_CAPSULE | Freq: Two times a day (BID) | ORAL | 0 refills | Status: DC

## 2016-02-27 MED ORDER — OMEPRAZOLE 20 MG PO CPDR: 20.0000 mg | DELAYED_RELEASE_CAPSULE | Freq: Every day | ORAL | 0 refills | Status: DC

## 2016-02-27 MED ORDER — AMOXICILL-CLARITHRO-OMEPRAZOLE 500-500-20 MG PO MISC: 1.0000 | Freq: Two times a day (BID) | ORAL | 0 refills | Status: DC

## 2016-02-27 NOTE — Addendum Note (Signed)
Addended by: Boykin Nearing on: 02/27/2016 12:36 PM   Modules accepted: Orders

## 2016-02-27 NOTE — Assessment & Plan Note (Signed)
H pylori breath test positive H pylori is a stomach bacteria that can cause inflammation, ulcers and pain Start treatment with Levaquin triple therapy This is omeprazole 20 mg twice daily for 14 days Levaquin 500 mg daily for 14 days  Amoxicillin 1000 mg twice daily for 14 days

## 2016-02-28 ENCOUNTER — Ambulatory Visit
Admission: RE | Admit: 2016-02-28 | Discharge: 2016-02-28 | Disposition: A | Discharge status: Home / Self Care | Payer: Medicare Other | Source: Ambulatory Visit | Attending: Family Medicine | Admitting: Family Medicine

## 2016-02-28 ENCOUNTER — Telehealth: Payer: Self-pay

## 2016-02-28 DIAGNOSIS — R05 Cough: Secondary | ICD-10-CM | POA: Diagnosis not present

## 2016-02-28 DIAGNOSIS — R0782 Intercostal pain: Secondary | ICD-10-CM

## 2016-02-28 NOTE — Telephone Encounter (Signed)
Pt was called and there is no VM set up to leave a message. 

## 2016-02-29 ENCOUNTER — Ambulatory Visit
Admission: RE | Admit: 2016-02-29 | Discharge: 2016-02-29 | Disposition: A | Discharge status: Home / Self Care | Payer: Medicare Other | Source: Ambulatory Visit | Attending: Hematology | Admitting: Hematology

## 2016-02-29 DIAGNOSIS — R928 Other abnormal and inconclusive findings on diagnostic imaging of breast: Secondary | ICD-10-CM | POA: Diagnosis not present

## 2016-02-29 DIAGNOSIS — Z170 Estrogen receptor positive status [ER+]: Principal | ICD-10-CM

## 2016-02-29 DIAGNOSIS — C50212 Malignant neoplasm of upper-inner quadrant of left female breast: Secondary | ICD-10-CM

## 2016-03-01 ENCOUNTER — Other Ambulatory Visit: Payer: Self-pay

## 2016-03-01 ENCOUNTER — Telehealth: Payer: Self-pay

## 2016-03-01 DIAGNOSIS — J439 Emphysema, unspecified: Secondary | ICD-10-CM | POA: Insufficient documentation

## 2016-03-01 DIAGNOSIS — A048 Other specified bacterial intestinal infections: Secondary | ICD-10-CM

## 2016-03-01 MED ORDER — LEVOFLOXACIN 500 MG PO TABS: 500.0000 mg | ORAL_TABLET | Freq: Every day | ORAL | 0 refills | Status: AC

## 2016-03-01 MED ORDER — ALBUTEROL SULFATE HFA 108 (90 BASE) MCG/ACT IN AERS: 2.0000 | INHALATION_SPRAY | Freq: Four times a day (QID) | RESPIRATORY_TRACT | 0 refills | Status: DC | PRN

## 2016-03-01 MED ORDER — OMEPRAZOLE 20 MG PO CPDR: 20.0000 mg | DELAYED_RELEASE_CAPSULE | Freq: Every day | ORAL | 0 refills | Status: DC

## 2016-03-01 MED ORDER — AMOXICILLIN 500 MG PO CAPS: 1000.0000 mg | ORAL_CAPSULE | Freq: Two times a day (BID) | ORAL | 0 refills | Status: AC

## 2016-03-01 NOTE — Telephone Encounter (Signed)
Pt was called and a VM was left informing pt to return phone call for lab results. 

## 2016-03-01 NOTE — Assessment & Plan Note (Signed)
There is not evidence of acute infection on CXR There is chronic bronchitis changes I suspect this is due to history of breast cancer treatment Add albuterol inhaler as needed for cough or shortness of breath Additional lung function testing also ordered

## 2016-03-01 NOTE — Telephone Encounter (Signed)
Pt returned phone call for lab results. Pt is aware of lab results and medication being sent to her pharmacy.

## 2016-03-01 NOTE — Addendum Note (Signed)
Addended by: Boykin Nearing on: 03/01/2016 01:40 PM   Modules accepted: Orders

## 2016-03-06 ENCOUNTER — Other Ambulatory Visit: Payer: Self-pay | Admitting: Pharmacist

## 2016-03-14 ENCOUNTER — Ambulatory Visit (HOSPITAL_COMMUNITY)
Admission: RE | Admit: 2016-03-14 | Discharge: 2016-03-14 | Disposition: A | Discharge status: Home / Self Care | Payer: Medicare Other | Source: Ambulatory Visit | Attending: Family Medicine | Admitting: Family Medicine

## 2016-03-14 DIAGNOSIS — R05 Cough: Secondary | ICD-10-CM | POA: Diagnosis not present

## 2016-03-14 DIAGNOSIS — R059 Cough, unspecified: Secondary | ICD-10-CM

## 2016-03-14 LAB — PULMONARY FUNCTION TEST
DL/VA % pred: 91 %
DL/VA: 4.71 ml/min/mmHg/L
DLCO unc % pred: 72 %
DLCO unc: 20.42 ml/min/mmHg
FEF 25-75 Post: 2.6 L/sec
FEF 25-75 Pre: 2.06 L/sec
FEF2575-%Change-Post: 26 %
FEF2575-%Pred-Post: 115 %
FEF2575-%Pred-Pre: 91 %
FEV1-%Change-Post: 7 %
FEV1-%Pred-Post: 87 %
FEV1-%Pred-Pre: 81 %
FEV1-Post: 2.33 L
FEV1-Pre: 2.16 L
FEV1FVC-%Change-Post: 2 %
FEV1FVC-%Pred-Pre: 102 %
FEV6-%Change-Post: 5 %
FEV6-%Pred-Post: 86 %
FEV6-%Pred-Pre: 81 %
FEV6-Post: 2.89 L
FEV6-Pre: 2.74 L
FEV6FVC-%Pred-Post: 104 %
FEV6FVC-%Pred-Pre: 104 %
FVC-%Change-Post: 5 %
FVC-%Pred-Post: 82 %
FVC-%Pred-Pre: 78 %
FVC-Post: 2.89 L
FVC-Pre: 2.74 L
Post FEV1/FVC ratio: 81 %
Post FEV6/FVC ratio: 100 %
Pre FEV1/FVC ratio: 79 %
Pre FEV6/FVC Ratio: 100 %
RV % pred: 183 %
RV: 4.15 L
TLC % pred: 124 %
TLC: 6.83 L

## 2016-03-14 MED ORDER — ALBUTEROL SULFATE (2.5 MG/3ML) 0.083% IN NEBU
2.5000 mg | INHALATION_SOLUTION | Freq: Once | RESPIRATORY_TRACT | Status: AC
  Administered 2016-03-14: 2.5 mg via RESPIRATORY_TRACT

## 2016-03-19 ENCOUNTER — Telehealth: Payer: Self-pay | Admitting: Family Medicine

## 2016-03-19 DIAGNOSIS — J439 Emphysema, unspecified: Secondary | ICD-10-CM

## 2016-03-19 NOTE — Assessment & Plan Note (Addendum)
Patient with chronic cough Chronic bronchitic changes on CXR 02/28/2016   PFTs 03/14/2016  Evidence of possible early emphysema Emphysema is abnormal enlargement of the small airways, there is also mild airway obstruction  Recommend avoid second hand smoke exposure OV with pulmonologist given this is an unusual finding in chronic non smoker  Advised yearly flu shot  Advised pneumovax  to protect lung health

## 2016-03-19 NOTE — Telephone Encounter (Signed)
Attempted to call patient to review PFTs  Evidence of possible early emphysema Emphysema is abnormal enlargement of the small airways, there is also mild airway obstruction  Recommend avoid second hand smoke exposure OV with pulmonologist given this is an unusual finding in chronic non smoker  Advised yearly flu shot  Advised pneumonia shot to protect lung health

## 2016-03-22 ENCOUNTER — Other Ambulatory Visit: Payer: Self-pay | Admitting: Family Medicine

## 2016-03-22 DIAGNOSIS — I152 Hypertension secondary to endocrine disorders: Secondary | ICD-10-CM

## 2016-03-24 IMAGING — CT CT CHEST W/O CM
2 of 4 series · 15 of 36 positions shown, 18 images · non-contrast
Comparison: AP CT on 07/20/2014

CLINICAL DATA: Left upper inner quadrant breast carcinoma. Previous
lumpectomy and radiation therapy. Followup indeterminate right lung
nodule.

EXAM:
CT CHEST WITHOUT CONTRAST
TECHNIQUE: Multidetector CT imaging of the chest was performed following the
standard protocol without IV contrast.

[Series 2: chest w/o st · axial · non-contrast · 0.82mm/px · z∈[-322,-28]mm · 12 of 71 slices shown, 15 images]
[im 6/71  mediastinal]
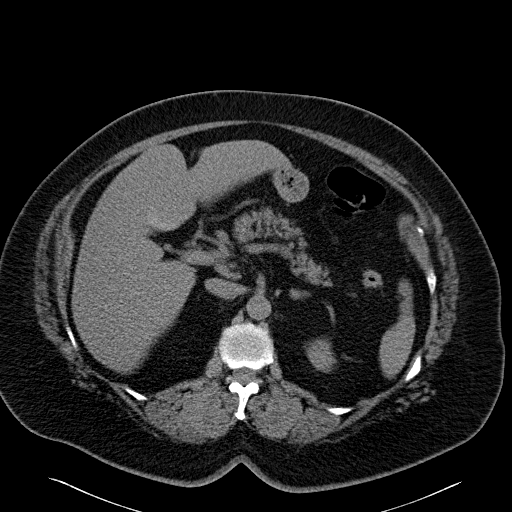
[im 6/71  lung]
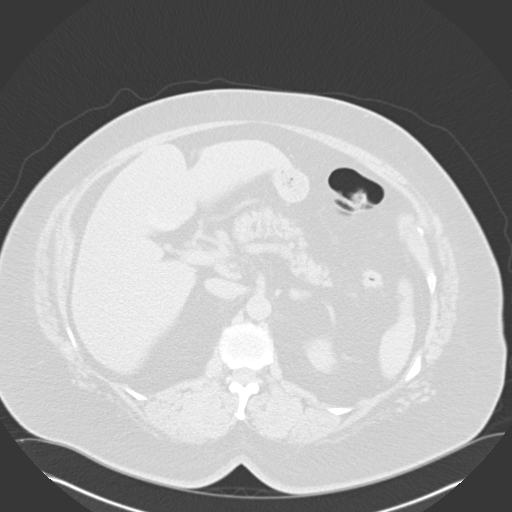
[im 11/71  lung]
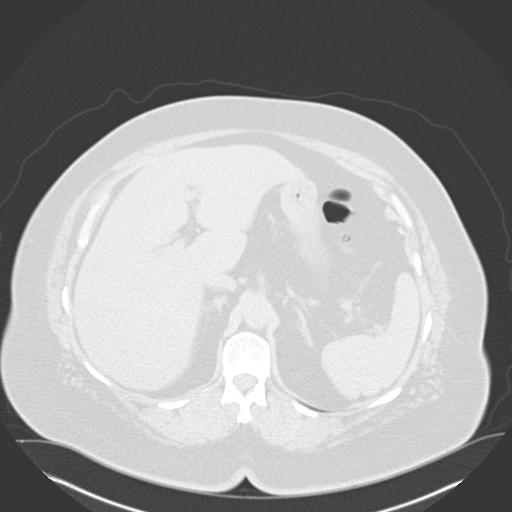
[im 17/71  lung]
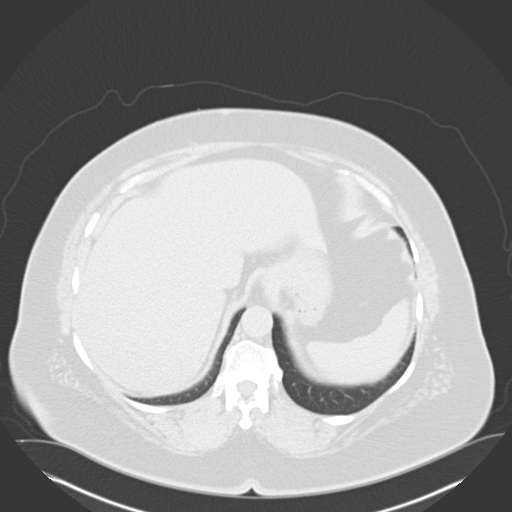
[im 22/71  lung]
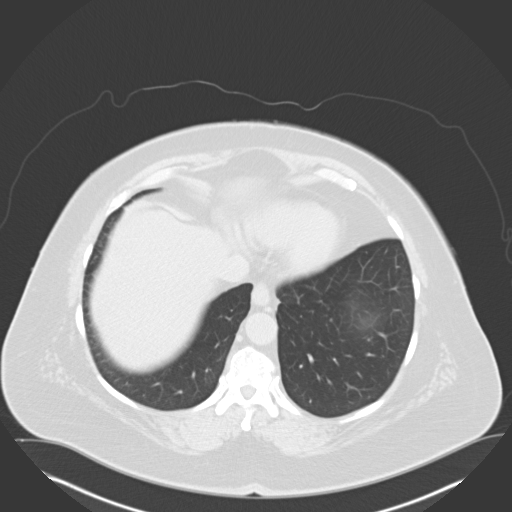
[im 27/71  mediastinal]
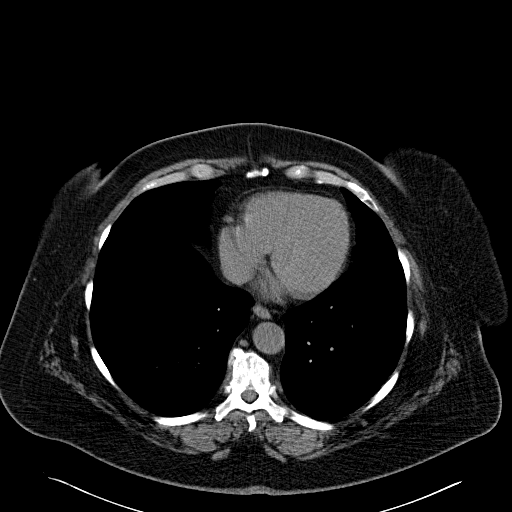
[im 27/71  lung]
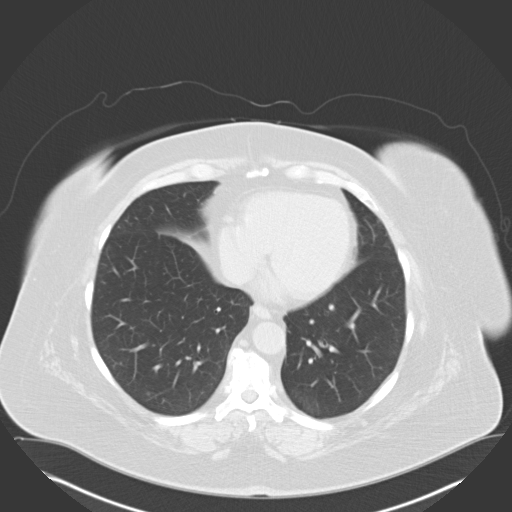
[im 33/71  lung]
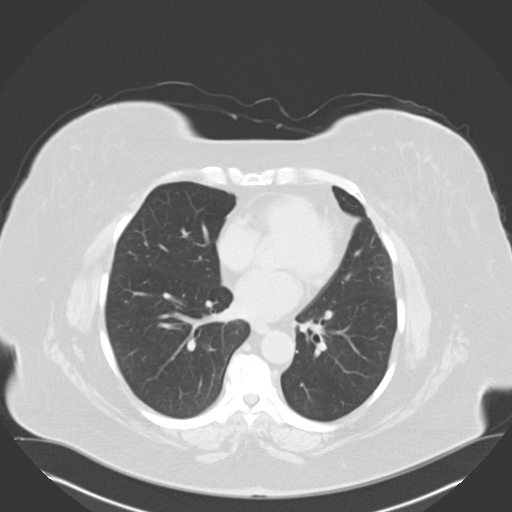
[im 38/71  lung]
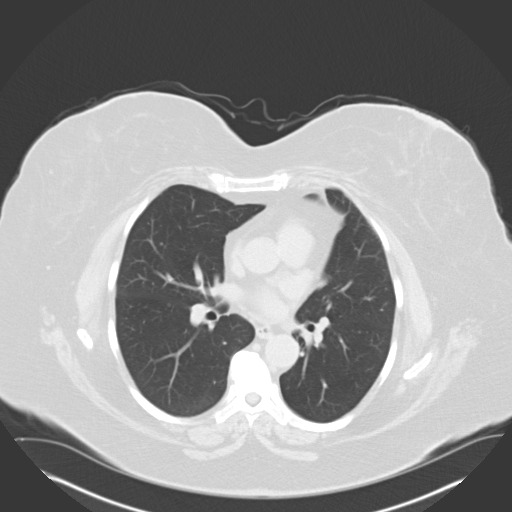
[im 44/71  lung]
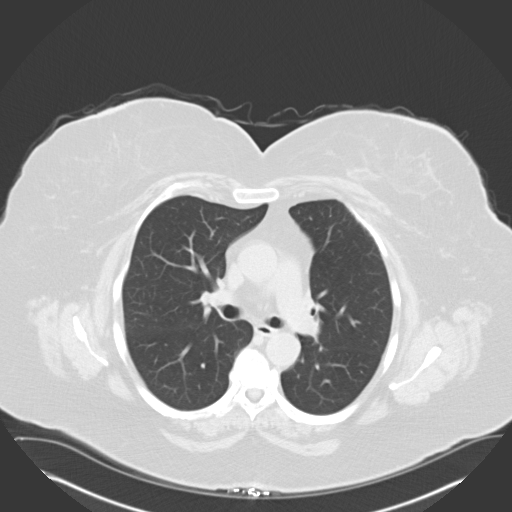
[im 49/71  mediastinal]
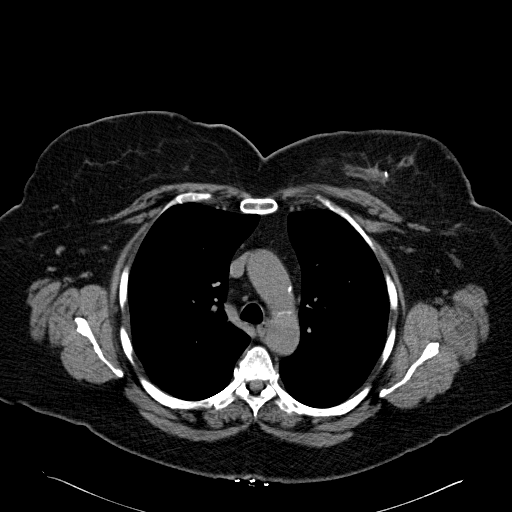
[im 49/71  lung]
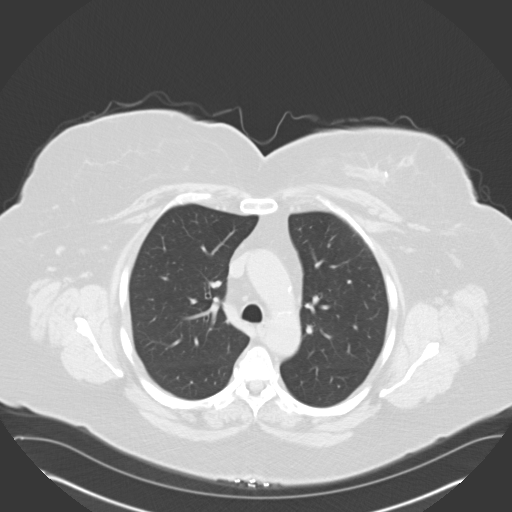
[im 54/71  lung]
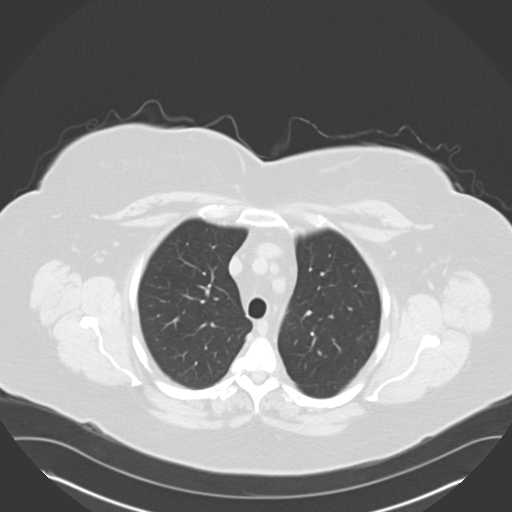
[im 60/71  lung]
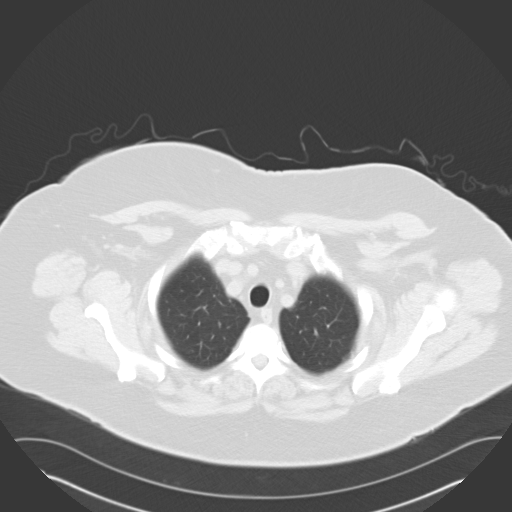
[im 65/71  lung]
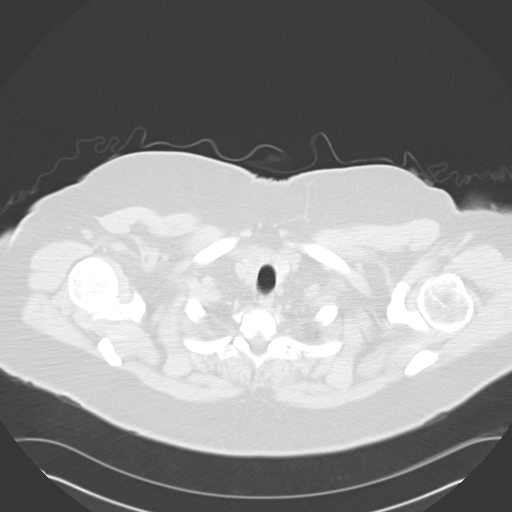

[Series 602: <mpr thick range> · coronal · 0.82mm/px · 3 of 103 slices shown]
[im 21/103  lung]
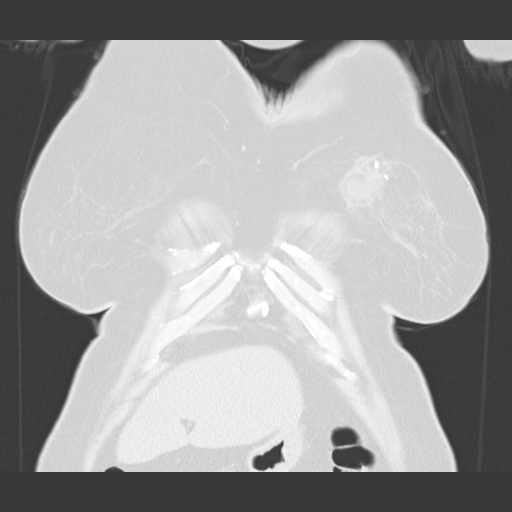
[im 41/103  lung]
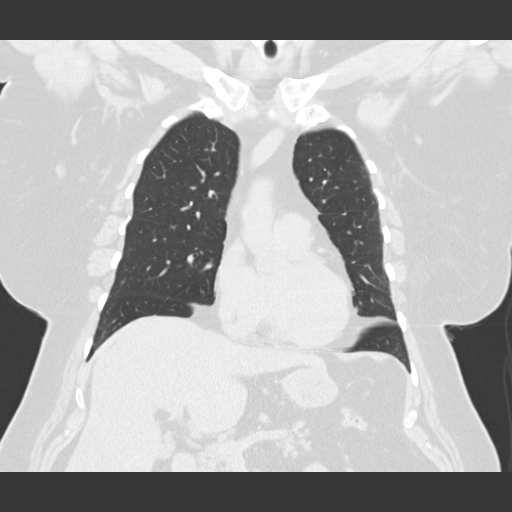
[im 62/103  lung]
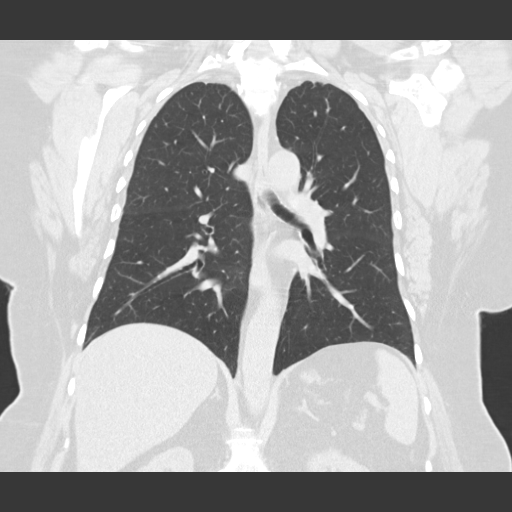

[15 of 36 positions shown; findings below may reference images not displayed]

FINDINGS: Mediastinum/Lymph Nodes: No pathologically enlarged lymph nodes
identified on this un-enhanced exam. Postop changes seen from left
breast lumpectomy. No evidence of axillary lymphadenopathy.

Small low-attenuation nodules are seen in the mid and lower pole the
right thyroid lobe. Largest in the lower pole of the right lobe
measures 1.7 cm and shows a punctate calcification.

Lungs/Pleura: Pulmonary nodules in both lower lobes remain stable,
largest in the left lower lobe measuring 4 mm on image 51 and in the
right lower lobe measuring 3 mm on image 44.

A well-circumscribed 4 mm pulmonary nodule is seen in the central
right middle lobe, and there are a few other tiny scattered 2-3 mm
pulmonary nodular densities in the right upper lobe.

No evidence of pulmonary infiltrate or pleural effusion.

Upper abdomen: No acute findings.

Musculoskeletal: No chest wall mass or suspicious bone lesions
identified.
IMPRESSION: Small indeterminate bilateral pulmonary nodules measuring up to 4
mm. Lower lobe nodules visualized on previous abdomen CT remains
stable. Continued followup by CT recommended in 6-9 months.

Right thyroid lobe nodules measuring up to 1.7 cm. Thyroid
ultrasound should be considered for further evaluation. This follows
ACR consensus guidelines: Managing Incidental Thyroid Nodules
Detected on Imaging: White Paper of [REDACTED]. [HOSPITAL] 8559; [DATE].

## 2016-04-12 ENCOUNTER — Ambulatory Visit (INDEPENDENT_AMBULATORY_CARE_PROVIDER_SITE_OTHER): Payer: Medicare Other | Admitting: Internal Medicine

## 2016-04-12 ENCOUNTER — Encounter: Payer: Self-pay | Admitting: Internal Medicine

## 2016-04-12 DIAGNOSIS — I10 Essential (primary) hypertension: Secondary | ICD-10-CM | POA: Diagnosis not present

## 2016-04-12 DIAGNOSIS — R0789 Other chest pain: Secondary | ICD-10-CM | POA: Diagnosis not present

## 2016-04-12 DIAGNOSIS — M4726 Other spondylosis with radiculopathy, lumbar region: Secondary | ICD-10-CM | POA: Diagnosis not present

## 2016-04-12 DIAGNOSIS — M542 Cervicalgia: Secondary | ICD-10-CM

## 2016-04-12 DIAGNOSIS — M545 Low back pain, unspecified: Secondary | ICD-10-CM

## 2016-04-12 DIAGNOSIS — R0600 Dyspnea, unspecified: Secondary | ICD-10-CM

## 2016-04-12 DIAGNOSIS — R0609 Other forms of dyspnea: Secondary | ICD-10-CM | POA: Diagnosis not present

## 2016-04-12 DIAGNOSIS — M79605 Pain in left leg: Secondary | ICD-10-CM

## 2016-04-12 MED ORDER — CLONIDINE HCL 0.1 MG PO TABS: 0.1000 mg | ORAL_TABLET | Freq: Two times a day (BID) | ORAL | 11 refills | Status: DC

## 2016-04-12 MED ORDER — TRAMADOL HCL 50 MG PO TABS: ORAL_TABLET | ORAL | 2 refills | Status: DC

## 2016-04-12 NOTE — Progress Notes (Signed)
Subjective:    Patient ID: Rachel Herman, female    DOB: July 10, 1950,    MRN: [id number]  HPI  23 yowf  Never smoker with h/o multiple back problems/ surgery per [facility]/ but very active yard/ house work and some power walks no trouble then abruptly while while pulling weeds acute pain below ribs = both sides around the around Feb 25 2016  and never resolved  So referred to pulmonary clinic 04/12/2016 by Dr   Adrian Blackwater   04/12/2016 1st [facility] office visit/ Rachel Herman   Chief Complaint  Patient presents with  . Pulmonary Consult    referred by [facility] for chest pain even when she is sitting still and SOB, she was coughing up mucus but ABX was rx'd for 14 days but she still coughs without production  acute onset circumferentially symmetric  cp while bending over weeding Feb 25 2016  then sev days later chronic cough got worse/ breathng problem and the cough made the cp worse and treated with abx and mucus turned clear but cough persisted day > noct and all symptoms better in recliner - cp resolves completely at 45 degrees  avg tramadol 50 mg twice daily   No obvious day to day or daytime variability or assoc excess/ purulent sputum or mucus plugs or hemoptysis or   chest tightness, subjective wheeze or overt sinus or hb symptoms. No unusual exp hx or h/o childhood pna/ asthma or knowledge of premature birth.  Sleeping ok without nocturnal  or early am exacerbation  of respiratory  c/o's or need for noct saba. Also denies any obvious fluctuation of symptoms with weather or environmental changes or other aggravating or alleviating factors except as outlined above   Current Medications, Allergies, Complete Past Medical History, Past Surgical History, Family History, and Social History were reviewed in [employer] record.      Review of Systems  Constitutional: Negative for fever and unexpected weight change.  HENT: Negative for congestion, dental problem, ear pain,  nosebleeds, postnasal drip, rhinorrhea, sinus pressure, sneezing, sore throat and trouble swallowing.   Eyes: Negative for redness and itching.  Respiratory: Positive for cough and shortness of breath. Negative for chest tightness and wheezing.   Cardiovascular: Negative for palpitations and leg swelling.  Gastrointestinal: Negative for nausea and vomiting.  Genitourinary: Negative for dysuria.  Musculoskeletal: Negative for joint swelling.  Skin: Negative for rash.  Neurological: Positive for headaches.  Hematological: Does not bruise/bleed easily.  Psychiatric/Behavioral: Negative for dysphoric mood. The patient is not nervous/anxious.        Objective:   Physical Exam  amb obese wf nad     Wt Readings from Last 3 Encounters:  04/12/16 217 lb 12.8 oz (98.8 kg)  02/26/16 214 lb 12.8 oz (97.4 kg)  01/11/16 214 lb 14.4 oz (97.5 kg)    Vital signs reviewed  - Note on arrival 02 sats  98% on RA     HEENT: nl dentition, turbinates bilaterally, and oropharynx. Nl external ear canals without cough reflex   NECK :  without JVD/Nodes/TM/ nl carotid upstrokes bilaterally   LUNGS: no acc muscle use,  Nl contour chest which is clear to A and P bilaterally without cough on insp or exp maneuvers   CV:  RRR  no s3 or murmur or increase in P2, and no edema   ABD:  Obese soft and nontender with nl inspiratory excursion in the supine position. No bruits or organomegaly appreciated, bowel sounds nl  MS:  Nl gait/ ext warm without deformities, calf tenderness, cyanosis or clubbing No obvious joint restrictions   SKIN: warm and dry without lesions    NEURO:  alert, approp, nl sensorium with  no motor or cerebellar deficits apparent.      I personally reviewed images and agree with radiology impression as follows:  CXR:  02/28/16 Stable chronic bronchitic changes. No acute cardiopulmonary abnormality.      Assessment & Plan:

## 2016-04-12 NOTE — Patient Instructions (Addendum)
Stop lisinopril   Clonidine 0.1 twice daily   For pain tramadol 50 mg up to every 4 hours as needed   GERD (REFLUX)  is an extremely common cause of respiratory symptoms just like yours , many times with no obvious heartburn at all.    It can be treated with medication, but also with lifestyle changes including elevation of the head of your bed (ideally with 6 inch  bed blocks),  Smoking cessation, avoidance of late meals, excessive alcohol, and avoid fatty foods, chocolate, peppermint, colas, red wine, and acidic juices such as orange juice.  NO MINT OR MENTHOL PRODUCTS SO NO COUGH DROPS  USE SUGARLESS CANDY INSTEAD (Jolley ranchers or Stover's or Life Savers) or even ice chips will also do - the key is to swallow to prevent all throat clearing. NO OIL BASED VITAMINS - use powdered substitutes.    If pain gets worse you will need a CT of your spine and follow up with Dr Arnoldo Morale

## 2016-04-14 DIAGNOSIS — R0789 Other chest pain: Secondary | ICD-10-CM | POA: Insufficient documentation

## 2016-04-14 DIAGNOSIS — R0609 Other forms of dyspnea: Secondary | ICD-10-CM

## 2016-04-14 DIAGNOSIS — R0600 Dyspnea, unspecified: Secondary | ICD-10-CM | POA: Insufficient documentation

## 2016-04-14 NOTE — Assessment & Plan Note (Signed)
In the best review of chronic cough to date ( NEJM 2016 375 [phone]) ,  ACEi are now felt to cause cough in up to  20% of pts which is a 4 fold increase from previous reports and does not include the variety of non-specific complaints we see in pulmonary clinic in pts on ACEi but previously attributed to another dx like  Copd/asthma and  include PNDS, throat and chest congestion, "bronchitis", unexplained dyspnea and noct "strangling" sensations, and hoarseness, but also  atypical /refractory GERD symptoms like dysphagia and "bad heartburn"   The only way I know  to prove this is not an "ACEi Case" is a trial off ACEi x a minimum of 6 weeks then regroup.   rec clonidine 0.1 mg bid since also has chronic pain

## 2016-04-14 NOTE — Assessment & Plan Note (Addendum)
Onset acute Feb 25 2016 typical circumferential at around T8-9 level  > classic presentation/ aggravated by cough from ACEi (see separate a/p)   rec try increase tramadol to max of 50 mg q h4 and if not better see Dr Arnoldo Morale, her NS  Total time devoted to counseling  > 50 % of initial 60 min office visit:  review case with pt/ discussion of options/alternatives/ personally creating written customized instructions  in presence of pt  then going over those specific  Instructions directly with the pt including how to use all of the meds but in particular covering each new medication in detail and the difference between the maintenance= "automatic" meds and the prns using an action plan format for the latter (If this problem/symptom => do that organization reading Left to right).  Please see AVS from this visit for a full list of these instructions which I personally wrote for this pt and  are unique to this visit.

## 2016-04-14 NOTE — Assessment & Plan Note (Addendum)
03/14/16 PFT's nl except low ERV   Probably multifactorial but mostly related to ACEi effects/ obesity /deconditioning but no evidence of a lung component per se >  pulmonary f/u is prn if not better off ACEi

## 2016-04-16 ENCOUNTER — Telehealth: Payer: Self-pay | Admitting: Family Medicine

## 2016-04-16 NOTE — Telephone Encounter (Signed)
Will route to PCP 

## 2016-04-16 NOTE — Telephone Encounter (Signed)
Patient is requesting letter regarding health conditions to provide to court to be exempt for jury duty  Please follow up with patient

## 2016-04-16 NOTE — Telephone Encounter (Signed)
Please inform patient Letter to be excused from jury duty written and ready for pick up

## 2016-04-17 NOTE — Telephone Encounter (Signed)
Pt was called and informed of letter being ready for pick up.

## 2016-05-08 NOTE — Progress Notes (Signed)
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 Ultrasound of the left axilla is negative.         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       12/29/2013 Surgery    Left lumpectomy with close (<0.1cm) inferior margin      03/02/2014 Surgery    Reexcision for close margin, path negative for malignancy.      04/05/2014 - 05/20/2014 Radiation Therapy    adjuvant breast radiation       05/27/2014 Imaging    Bone density scan: normal       06/07/2014 -  Anti-estrogen oral therapy    Exemestane 25 mg daily      06/20/2015 Imaging    Bone scan 06/20/2015 IMPRESSION: No evidence of metastatic disease. Mild increased activity dorsal aspect right midfoot probable degenerative in nature. Clinical correlation is necessary.      02/29/2016 Mammogram    MM DIAG BREAST TOMO BIALTERAL 02/29/16 IMPRESSION: Stable left breast  lumpectomy site. No mammographic evidence of malignancy in the bilateral breasts.      CURRENT THERAPY: Exemestane 36m daily, started on 06/07/2014                                    INTERIM HISTORY: She returns for follow up. The patient states she is doing well. She reports in January she had a stomach bacterial infection and pneumonia. She states everything has resolved. Reports hot flashes, but they are tolerable. Mammogram in January 2018 was negative. Tenderness in the epigastric area.  MEDICAL HISTORY:  Past Medical History:  Diagnosis Date  . Breast cancer ([facility] 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  . Diabetes mellitus ([facility] 09/16/13   Diagnosed on 09/16/13; HgA1C was 7.2/metformin  . Dizziness   . GERD (gastroesophageal reflux disease)   . Headache   . Hyperlipidemia   . Hypertension 2014  . Low back pain   . Obesity, morbid, BMI 40.0-49.9 ([facility]   . PONV (postoperative nausea and vomiting)   . S/P radiation therapy 04/05/14-05/20/14   left breast 60.4Gy totaldose    SURGICAL HISTORY: Past Surgical History:  Procedure Laterality Date  . ABDOMINAL HYSTERECTOMY N/A 09/20/2013   Procedure: HYSTERECTOMY ABDOMINAL;  Surgeon: UOsborne Oman MD;  [facility];  Service: Gynecology;  Laterality: N/A;  . BREAST LUMPECTOMY WITH AXILLARY LYMPH NODE BIOPSY Left 12/29/13   left breast   . LAPAROSCOPIC ASSISTED VAGINAL HYSTERECTOMY  09/20/2013   Procedure: LAPAROSCOPIC ASSISTED VAGINAL HYSTERECTOMY;  Surgeon: Osborne Oman, MD;  Location: [facility];  Service: Gynecology;;  PT WAS EXAMINED WHILE UP IN [place] AND IT WAS DECIDED TO OPEN PT DUE TO LARGE MASS  . LUMBAR LAMINECTOMY/DECOMPRESSION MICRODISCECTOMY Right 12/14/2014   Procedure: Right Lumbar three-four microdiskectomy;  Surgeon: Newman Pies, MD;  Location: [facility];  Service: Neurosurgery;  Laterality: Right;  Right Lumbar three-four microdiskectomy  . RADIOACTIVE SEED GUIDED MASTECTOMY WITH AXILLARY  SENTINEL LYMPH NODE BIOPSY Left 12/29/2013   Procedure: LEFT BREAST RADIOACTIVE SEED LOCALIZED LUMPECTOMY WITH SENTINEL LYMPH NODE MAPPING;  Surgeon: Erroll Luna, MD;  Location: [facility];  Service: General;  Laterality: Left;  . RE-EXCISION OF BREAST LUMPECTOMY Left 03/02/2014   Procedure: RE-EXCISION OF LEFT BREAST LUMPECTOMY;  Surgeon: Erroll Luna, MD;  Location: [facility];  Service: General;  Laterality: Left;  . SALPINGOOPHORECTOMY  09/20/2013   Procedure: SALPINGO OOPHORECTOMY;  Surgeon: Osborne Oman, MD;  Location: [facility];  Service: Gynecology;;    SOCIAL HISTORY: Social History   Social History  . Marital status: Married    Spouse name: Micaela Stith   . Number of children: 3  . Years of education: 12   Occupational History  .  Unemployed   Social History Main Topics  . Smoking status: Never Smoker  . Smokeless tobacco: Never Used  . Alcohol use No  . Drug use: No  . Sexual activity: Not Currently    Birth control/ protection: Post-menopausal   Other Topics Concern  . Not on file   Social History Narrative   Married to [employer] in 1986.    Has 3 children from previous marriage, 1 in [place], 1 in [place], 1 in prison ([place]).    Lives with husband.    Right-handed.   1 cup caffeine per day.       FAMILY HISTORY: Family History  Problem Relation Age of Onset  . Diabetes Mother   . Multiple myeloma Mother 73  . Hearing loss Mother   . Diabetes Brother   . Cancer Brother 40    prostate cancer   . Diabetes Maternal Aunt   . Leukemia Maternal Aunt     dx in her 23s  . Alcoholism Brother   . Heart attack Father   . Diabetes Sister   . Diabetes Son     ALLERGIES:  has No Known Allergies.  MEDICATIONS:  Current Outpatient Prescriptions  Medication Sig Dispense Refill  . albuterol (PROVENTIL HFA;VENTOLIN HFA) 108 (90 Base) MCG/ACT inhaler Inhale 2 puffs into the lungs every 6 (six) hours as needed  for wheezing or shortness of breath. 1 Inhaler 0  . Ascorbic Acid (VITAMIN C) 1000 MG tablet Take 1,000 mg by mouth daily.    Marland Kitchen aspirin 325 MG tablet Take 325 mg by mouth once.    . Blood Glucose Monitoring Suppl (TRUE METRIX METER) w/Device KIT 1 each by Does not apply route as needed. 1 kit 0  . cloNIDine (CATAPRES) 0.1 MG tablet Take 1 tablet (0.1 mg total) by mouth 2 (two) times daily. 60 tablet 11  . [employer] SENNA PLUS 8.6-50 MG tablet TAKE 2 TABLETS BY MOUTH AT BEDTIME AS NEEDED FOR MILD CONSTIPATION  11  . cyclobenzaprine (FLEXERIL) 10 MG tablet Take 1 tablet (10  mg total) by mouth 3 (three) times daily as needed for muscle spasms. 30 tablet 0  . exemestane (AROMASIN) 25 MG tablet Take 1 tablet (25 mg total) by mouth daily after breakfast. 90 tablet 3  . gabapentin (NEURONTIN) 300 MG capsule 300 mg during the day, 600 mg at night by mouth 270 capsule 4  . glucose blood test strip Use as instructed 100 each 12  . guaiFENesin-codeine (CHERATUSSIN AC) 100-10 MG/5ML syrup Take 5 mLs by mouth 3 (three) times daily as needed for cough. 120 mL 0  . Lancets (ACCU-CHEK MULTICLIX) lancets 1 each by Other route 3 (three) times daily. ICD 10 E11.9 100 each 12  . metFORMIN (GLUCOPHAGE) 500 MG tablet Take 1 tablet (500 mg total) by mouth 2 (two) times daily. 180 tablet 3  . Multiple Vitamins-Minerals (MULTIVITAMIN WITH MINERALS) tablet Take 1 tablet by mouth daily. Reported on 03/13/2015    . traMADol (ULTRAM) 50 MG tablet On every 4 hours as needed for pan 120 tablet 2  . vitamin D, CHOLECALCIFEROL, 400 UNITS tablet Take 400 Units by mouth daily.    . hydrochlorothiazide (HYDRODIURIL) 25 MG tablet TAKE 1 TABLET BY MOUTH EVERY DAY 90 tablet 0   No current facility-administered medications for this visit.     REVIEW OF SYSTEMS:   Constitutional: Denies fevers, chills or abnormal night sweats, (+) hot flashes Eyes: No vision difficulties. Ears, nose, mouth, throat, and face: Denies mucositis or sore  throat Respiratory: No respiratory difficulty Cardiovascular: Denies palpitation, chest discomfort or lower extremity swelling Gastrointestinal:  Denies nausea, heartburn or change in bowel habits (+) Tenderness in the epigastric area. Skin: Denies abnormal skin rashes Lymphatics: Denies new lymphadenopathy or easy bruising Neurological:Denies numbness, tingling or new weaknesses Behavioral/Psych: Mood is stable, no new changes  All other systems were reviewed with the patient and are negative.  PHYSICAL EXAMINATION: ECOG PERFORMANCE STATUS: 2  BP (!) 120/51 (BP Location: Left Arm, Patient Position: Sitting)   Pulse 70   Temp 98.3 F (36.8 C) (Oral)   Resp 18   Ht _0  (1.702 m)   Wt 217 lb 12.8 oz (98.8 kg)   SpO2 100%   BMI 34.11 kg/m   GENERAL:alert, no distress and comfortable SKIN: skin color, texture, turgor are normal, no rashes or significant lesions EYES: normal, conjunctiva are pink and non-injected, sclera clear OROPHARYNX:no exudate, no erythema and lips, buccal mucosa, and tongue normal  NECK: supple, thyroid normal size, non-tender, without nodularity LYMPH:  no palpable lymphadenopathy in the cervical, axillary or inguinal LUNGS: clear to auscultation and percussion with normal breathing effort HEART: regular rate & rhythm and no murmurs and no lower extremity edema ABDOMEN:abdomen soft, nontender, no rebound pain normal bowel sounds (+) Tenderness in the epigastric area. Musculoskeletal:no cyanosis of digits and no clubbing PSYCH: alert & oriented x 3 with fluent speech NEURO: no focal motor/sensory deficits Breasts: Breast inspection showed them to be symmetrical with no nipple discharge. Surgical scar at the left upper breast and axillary area are well healed. Palpation of the right breasts and axilla revealed no obvious mass that I could appreciate.   I have reviewed the data as listed LABORATORY DATA:  CBC Latest Ref Rng & Units 05/10/2016 01/11/2016 09/11/2015   WBC 3.9 - 10.3 10e3/uL 5.7 8.0 8.0  Hemoglobin 11.6 - 15.9 g/dL 12.6 13.1 12.5  Hematocrit 34.8 - 46.6 % 38.8 39.5 38.9  Platelets 145 - 400 10e3/uL 238 280 250    CMP Latest Ref Rng &  Units 05/10/2016 01/11/2016 09/11/2015  Glucose 70 - 140 mg/dl 124 109 93  BUN 7.0 - 26.0 mg/dL 24.5 24.0 27.6(H)  Creatinine 0.6 - 1.1 mg/dL 1.0 1.0 1.3(H)  Sodium 136 - 145 mEq/L 145 145 144  Potassium 3.5 - 5.1 mEq/L 3.7 4.3 4.0  Chloride 101 - 111 mmol/L - - -  CO2 22 - 29 mEq/L _0 Calcium 8.4 - 10.4 mg/dL 9.4 10.0 10.0  Total Protein 6.4 - 8.3 g/dL 6.9 7.5 7.7  Total Bilirubin 0.20 - 1.20 mg/dL 0.48 0.23 0.32  Alkaline Phos 40 - 150 U/L 63 68 54  AST 5 - 34 U/L _1 ALT 0 - 55 U/L 25 26 34     PATHOLOGY REPORT Diagnosis 12/29/2013 1. Breast, lumpectomy, left - INVASIVE GRADE II DUCTAL CARCINOMA, SPANNING 1.7 CM IN GREATEST DIMENSION. - SECOND FOCUS OF INVASIVE GRADE I DUCTAL CARCINOMA, SPANNING 0.5 CM IN GREATEST DIMENSION. - INTERMEDIATE GRADE DUCTAL CARCINOMA IN SITU ASSOCIATED WITH BOTH FOCI OF TUMOR. - INVASIVE DUCTAL CARCINOMA IS EXTREMELY CLOSE (LESS THAN 0.1 CM) TO INFERIOR MARGIN. - DUCTAL CARCINOMA IN SITU IS CLOSE (0.1 CM) TO INFERIOR MARGIN. - OTHER MARGINS NEGATIVE. - SEE ONCOLOGY TEMPLATE. 2. Lymph node, sentinel, biopsy, Left axillary 1 of 4 FINAL for AMRY, CATHY ([id number]) Diagnosis(continued) - ONE BENIGN LYMPH NODE WITH NO TUMOR SEEN (0/1). 3. Lymph node, sentinel, biopsy, Left axilla - ONE BENIGN LYMPH NODE WITH NO TUMOR SEEN (0/1). Microscopic Comment 1. BREAST, INVASIVE TUMOR, WITH LYMPH NODES PRESENT Specimen, including laterality and lymph node sampling (sentinel, non-sentinel): Left partial breast with left sentinel lymph node sampling. Procedure: Left breast lumpectomy with sentinel lymph node biopsies. Histologic type: Invasive ductal carcinoma. Larger focus: Grade: II. Tubule formation: 2. Nuclear pleomorphism: 2. Mitotic:  3. Smaller focus (near inferior margin): Grade: I. Tubule formation: 1. Nuclear pleomorphism: 2. Mitotic: 1. Tumor sizes (gross and glass slide measurement): 1.7 cm and 0.5 cm. Margins: Invasive, distance to closest margin: less than 0.1 cm (inferior margin). In-situ, distance to closest margin: 0.1 cm (inferior margin). Lymphovascular invasion: Definitive lymphovascular invasion is not identified. Ductal carcinoma in situ: Yes. Grade: Intermediate grade. Extensive intraductal component: There is an extensive intraductal component of ductal carcinoma in situ on the smaller focus but not on the larger focus. Lobular neoplasia: No. Tumor focality: Two foci, multifocal. Treatment effect: N/A. Extent of tumor: Tumor confined to breast parenchyma. Skin: Not received. Nipple: Not received. Skeletal muscle: Not received. Lymph nodes: Examined: 2 Sentinel. 0 Non-sentinel. 2 Total. Lymph nodes with metastasis: 0. Breast prognostic profile: Performed on previous case ([id number]). Estrogen receptor: 100%, positive. Progesterone receptor: 96%, positive. Her 2 neu by CISH: 0.81 ratio, no amplification. Ki-67: 73%. Non-neoplastic breast: Fat necrosis. TNM: pT1c(m), pN0, MX. Comments: A smooth muscle myosin, calponin, and p63 immunohistochemical stain are performed on a single block, which helps to confirm the presence of a second focus of invasive ductal carcinoma associated 2 of 4 FINAL for YUVONNE, LANAHAN ([id number]) Microscopic Comment(continued) with ductal carcinoma in situ. As Her-2 neu by CISH was negative on the initial biopsy, this will be repeated on the larger tumor focus and reported in an addendum to follow. A breast prognostic profile will not be performed on the smaller tumor focus unless otherwise requested (the tumor foci although different grades show morphologically similar nuclear features). (RH:ds 12/31/13)  Oncotype DX Score: 23 (Intermediate risk)  (ER+/PR+/HER2-)   Diagnosis 05/09/2015 THYROID, FINE NEEDLE ASPIRATION, RLP (SPECIMEN 1 OF 2  COLLECTED 05-09-2015) CONSISTENT WITH BENIGN FOLLICULAR NODULE (BETHESDA CATEGORY II).  Diagnosis 05/09/2015 THYROID, FINE NEEDLE ASPIRATION, LMP (SPECIMEN 2 OF 2 COLLECTED 05-09-2015) CONSISTENT WITH BENIGN FOLLICULAR NODULE (BETHESDA CATEGORY II).   RADIOGRAPHIC STUDIES: I have personally reviewed the radiological images as listed and agreed with the findings in the report.  Mammogram 02/14/2015 IMPRESSION: No findings worrisome for recurrent tumor or developing malignancy.  RECOMMENDATION: Bilateral diagnostic mammography in 1 year.   US SOFT TISSUE HEAD AND NECK 04/03/2015 IMPRESSION: Multi bilateral nodules. Largest on the right measures 19 mm. Largest on left measures 21 mm. Findings meet consensus criteria for biopsy. Ultrasound-guided fine needle aspiration should be considered, as per the consensus statement: Management of Thyroid Nodules Detected at [place]: Society of Radiologists in [place]. Radiology 2005; [id number].  Bone scan 06/20/2015 IMPRESSION: No evidence of metastatic disease. Mild increased activity dorsal aspect right midfoot probable degenerative in nature. Clinical correlation is necessary.   ASSESSMENT & PLAN:  66 y.o. Caucasian female, with past medical history of hypertension, diabetes, dyslipidemia, obesity who was found to have a left breast mass by screening mammogram.  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 Grade 2, Ki-67 73%. -She is status post complete surgical resection with lumpectomy. -Her breast cancer is likely cured by surgery alone, but does has some risk of disease recurrence in the future. -I discussed the Oncotype DX result with her and her husband. The recurrence score is 23, with the predicted 10 year recurrence  risk of 15% with tamoxifen alone. The benefit of chemotherapy is uncertain in this intermediate risk group. I do not strongly recommend adjuvant chemotherapy. Patient agree with not having chemotherapy.  -Due to the strong positivity of ER and PR, I recommended adjuvant  Aromasin for total 5 years -She has been tolerating aromasin well, will continue for a total of 5 years  - due to the new onset bilateral rib pain and midthoracic pain in 06/2015, I ordered a bone scan which was negative for bone metastasis. -she is clinically doing well, lab and exam unremarkable today. Her last mammogram on 02/29/2016 was normal. No evidence of recurrence. She will continue surveillance. - I encouraged her to eat healthy, try to be more physically active, and try to lose some weight.  #2 Bone health -Her recent bone density scan was normal in 05/2014, will repeat in April 2018 -We discussed Aromasin may weak her bone, she will continue calcium and vitamin D.  #3. hypertension, diabetes, dyslipidemia, chronic low back pain -She will continue follow-up with her primary care physician.  #4 Morbid obesity -We discussed healthy diet and regular exercise. She is willing to try after she recovers well from her back surgery  #5 right lung nodule -She had a CT abdomen and pelvis in June 2016, which incidentally found a 4 mm nodule in the right lower lobe. I personally reviewed the image.  -She never smoked, risk of lung cancer is low  -her repeated CT chest from 12/09/2014, which showed stable small lung nodules, likely benign. -she had CT chest on 08/18/2015 for chest pain, which showed stable 3m right lung base subpleural nodule, likely benign, no need to follow-up since she is a never smoker   #6. Thyroid  Benign follicular nodule -Her CT scan revealed a 1.7 cm right thyroid nodule. - ultrasound guided thyroid nodule biopsy on 05/09/2015 showed benign follicular nodule   #7 epigastric pain -Uncertain  etiology. She  has been following up with her primary care physician -unlikely related to exemestane  PLAN: -continue exemastane. -Lab and f/u in 6 months -DEXA at [facility] this month.   All questions were answered. The patient knows to call the clinic with any problems, questions or concerns. I spent 25 minutes counseling the patient face to face. The total time spent in the appointment was 30 minutes and more than 50% was on counseling.     Truitt Merle, MD 05/10/2016    This document serves as a record of services personally performed by Truitt Merle, MD. It was created on her behalf by Darcus Austin, a trained medical scribe. The creation of this record is based on the scribe's personal observations and the provider's statements to them. This document has been checked and approved by the attending provider.

## 2016-05-10 ENCOUNTER — Other Ambulatory Visit: Payer: Self-pay | Admitting: Family Medicine

## 2016-05-10 ENCOUNTER — Other Ambulatory Visit (HOSPITAL_BASED_OUTPATIENT_CLINIC_OR_DEPARTMENT_OTHER): Payer: Medicare Other

## 2016-05-10 ENCOUNTER — Telehealth: Payer: Self-pay | Admitting: Hematology

## 2016-05-10 ENCOUNTER — Ambulatory Visit (HOSPITAL_BASED_OUTPATIENT_CLINIC_OR_DEPARTMENT_OTHER): Payer: Medicare Other | Admitting: Hematology

## 2016-05-10 ENCOUNTER — Encounter: Payer: Self-pay | Admitting: Hematology

## 2016-05-10 VITALS — BP 120/51 | HR 70 | Temp 98.3°F | Resp 18 | Ht 67.0 in | Wt 217.8 lb

## 2016-05-10 DIAGNOSIS — Z170 Estrogen receptor positive status [ER+]: Secondary | ICD-10-CM

## 2016-05-10 DIAGNOSIS — E2839 Other primary ovarian failure: Secondary | ICD-10-CM

## 2016-05-10 DIAGNOSIS — Z6841 Body Mass Index (BMI) 40.0 and over, adult: Secondary | ICD-10-CM

## 2016-05-10 DIAGNOSIS — R1013 Epigastric pain: Secondary | ICD-10-CM

## 2016-05-10 DIAGNOSIS — C50212 Malignant neoplasm of upper-inner quadrant of left female breast: Secondary | ICD-10-CM | POA: Diagnosis not present

## 2016-05-10 DIAGNOSIS — Z79811 Long term (current) use of aromatase inhibitors: Secondary | ICD-10-CM

## 2016-05-10 DIAGNOSIS — I152 Hypertension secondary to endocrine disorders: Secondary | ICD-10-CM

## 2016-05-10 DIAGNOSIS — I10 Essential (primary) hypertension: Secondary | ICD-10-CM

## 2016-05-10 LAB — CBC WITH DIFFERENTIAL/PLATELET
BASO%: 1 % (ref 0.0–2.0)
Basophils Absolute: 0.1 10*3/uL (ref 0.0–0.1)
EOS%: 3.9 % (ref 0.0–7.0)
Eosinophils Absolute: 0.2 10*3/uL (ref 0.0–0.5)
HCT: 38.8 % (ref 34.8–46.6)
HGB: 12.6 g/dL (ref 11.6–15.9)
LYMPH%: 31.3 % (ref 14.0–49.7)
MCH: 28.1 pg (ref 25.1–34.0)
MCHC: 32.5 g/dL (ref 31.5–36.0)
MCV: 86.6 fL (ref 79.5–101.0)
MONO#: 0.5 10*3/uL (ref 0.1–0.9)
MONO%: 8.3 % (ref 0.0–14.0)
NEUT#: 3.2 10*3/uL (ref 1.5–6.5)
NEUT%: 55.5 % (ref 38.4–76.8)
Platelets: 238 10*3/uL (ref 145–400)
RBC: 4.48 10*6/uL (ref 3.70–5.45)
RDW: 14.3 % (ref 11.2–14.5)
WBC: 5.7 10*3/uL (ref 3.9–10.3)
lymph#: 1.8 10*3/uL (ref 0.9–3.3)

## 2016-05-10 LAB — COMPREHENSIVE METABOLIC PANEL
ALT: 25 U/L (ref 0–55)
AST: 21 U/L (ref 5–34)
Albumin: 3.7 g/dL (ref 3.5–5.0)
Alkaline Phosphatase: 63 U/L (ref 40–150)
Anion Gap: 10 mEq/L (ref 3–11)
BUN: 24.5 mg/dL (ref 7.0–26.0)
CO2: 26 mEq/L (ref 22–29)
Calcium: 9.4 mg/dL (ref 8.4–10.4)
Chloride: 108 mEq/L (ref 98–109)
Creatinine: 1 mg/dL (ref 0.6–1.1)
EGFR: 59 mL/min/{1.73_m2} — ABNORMAL LOW (ref >90)
Glucose: 124 mg/dl (ref 70–140)
Potassium: 3.7 mEq/L (ref 3.5–5.1)
Sodium: 145 mEq/L (ref 136–145)
Total Bilirubin: 0.48 mg/dL (ref 0.20–1.20)
Total Protein: 6.9 g/dL (ref 6.4–8.3)

## 2016-05-10 NOTE — Telephone Encounter (Signed)
Appointments scheduled per 4.6.18 LOS. Patient given AVS report and calendars with future scheduled appointments. °

## 2016-06-19 ENCOUNTER — Encounter: Payer: Self-pay | Admitting: Family Medicine

## 2016-06-24 ENCOUNTER — Other Ambulatory Visit: Payer: Self-pay | Admitting: Family Medicine

## 2016-06-24 DIAGNOSIS — I152 Hypertension secondary to endocrine disorders: Secondary | ICD-10-CM

## 2016-07-02 ENCOUNTER — Encounter: Payer: Self-pay | Admitting: Family Medicine

## 2016-07-02 ENCOUNTER — Ambulatory Visit: Payer: Medicare Other | Attending: Family Medicine | Admitting: Family Medicine

## 2016-07-02 VITALS — BP 119/80 | HR 79 | Temp 98.1°F | Wt 216.4 lb

## 2016-07-02 DIAGNOSIS — E041 Nontoxic single thyroid nodule: Secondary | ICD-10-CM | POA: Insufficient documentation

## 2016-07-02 DIAGNOSIS — Z7984 Long term (current) use of oral hypoglycemic drugs: Secondary | ICD-10-CM | POA: Diagnosis not present

## 2016-07-02 DIAGNOSIS — Z853 Personal history of malignant neoplasm of breast: Secondary | ICD-10-CM | POA: Diagnosis not present

## 2016-07-02 DIAGNOSIS — E119 Type 2 diabetes mellitus without complications: Secondary | ICD-10-CM

## 2016-07-02 DIAGNOSIS — M542 Cervicalgia: Secondary | ICD-10-CM | POA: Insufficient documentation

## 2016-07-02 DIAGNOSIS — G8929 Other chronic pain: Secondary | ICD-10-CM | POA: Diagnosis not present

## 2016-07-02 DIAGNOSIS — M79604 Pain in right leg: Secondary | ICD-10-CM | POA: Insufficient documentation

## 2016-07-02 DIAGNOSIS — N8110 Cystocele, unspecified: Secondary | ICD-10-CM | POA: Insufficient documentation

## 2016-07-02 DIAGNOSIS — R140 Abdominal distension (gaseous): Secondary | ICD-10-CM | POA: Insufficient documentation

## 2016-07-02 DIAGNOSIS — M545 Low back pain: Secondary | ICD-10-CM | POA: Insufficient documentation

## 2016-07-02 DIAGNOSIS — R102 Pelvic and perineal pain: Secondary | ICD-10-CM | POA: Insufficient documentation

## 2016-07-02 DIAGNOSIS — Z7982 Long term (current) use of aspirin: Secondary | ICD-10-CM | POA: Insufficient documentation

## 2016-07-02 LAB — POCT URINALYSIS DIPSTICK
Bilirubin, UA: NEGATIVE
Blood, UA: NEGATIVE
Glucose, UA: NEGATIVE
Ketones, UA: NEGATIVE
Leukocytes, UA: NEGATIVE
Nitrite, UA: NEGATIVE
Protein, UA: NEGATIVE
Spec Grav, UA: 1.025 (ref 1.010–1.025)
Urobilinogen, UA: 0.2 E.U./dL
pH, UA: 5 (ref 5.0–8.0)

## 2016-07-02 LAB — HEMOCCULT GUIAC POC 1CARD (OFFICE): Fecal Occult Blood, POC: NEGATIVE

## 2016-07-02 LAB — POCT UA - MICROALBUMIN

## 2016-07-02 LAB — GLUCOSE, POCT (MANUAL RESULT ENTRY): POC Glucose: 115 mg/dl — AB (ref 70–99)

## 2016-07-02 LAB — POCT GLYCOSYLATED HEMOGLOBIN (HGB A1C): Hemoglobin A1C: 6

## 2016-07-02 MED ORDER — TRAMADOL HCL 50 MG PO TABS: 50.0000 mg | ORAL_TABLET | ORAL | 2 refills | Status: DC | PRN

## 2016-07-02 MED ORDER — TRAMADOL HCL 50 MG PO TABS: ORAL_TABLET | ORAL | 2 refills | Status: DC

## 2016-07-02 NOTE — Progress Notes (Signed)
Pt is having pain in vaginal area, abdominal pain, and back pain.

## 2016-07-02 NOTE — Assessment & Plan Note (Signed)
Bloating with hard stool in rectal vault Suspect constipation Plain film ordered

## 2016-07-02 NOTE — Patient Instructions (Addendum)
Rachel Herman was seen today for pelvic pain.  Diagnoses and all orders for this visit:  Type 2 diabetes mellitus without complication, without long-term current use of insulin (HCC) -     POCT glucose (manual entry) -     HgB A1c -     POCT UA - Microalbumin  Neck pain on right side -     Discontinue: traMADol (ULTRAM) 50 MG tablet; On every 4 hours as needed for pan -     traMADol (ULTRAM) 50 MG tablet; Take 1 tablet (50 mg total) by mouth every 4 (four) hours as needed for moderate pain.  Cystocele without uterine prolapse -     POCT urinalysis dipstick -     Ambulatory referral to Gynecology  Lumbar pain with radiation down right leg -     Ambulatory referral to Neurosurgery -     traMADol (ULTRAM) 50 MG tablet; Take 1 tablet (50 mg total) by mouth every 4 (four) hours as needed for moderate pain. -     DG Lumbar Spine 2-3 Views; Future  Abdominal bloating -     DG Abd 1 View; Future   F/u in 8 week for cystocele and back pain   Dr. Adrian Blackwater     About Cystocele  Overview  The pelvic organs, including the bladder, are normally supported by pelvic floor muscles and ligaments.  When these muscles and ligaments are stretched, weakened or torn, the wall between the bladder and the vagina sags or herniates causing a prolapse, sometimes called a cystocele.  This condition may cause discomfort and problems with emptying the bladder.  It can be present in various stages.  Some people are not aware of the changes.  Others may notice changes at the vaginal opening or a feeling of the bladder dropping outside the body.  Causes of a Cystocele  A cystocele is usually caused by muscle straining or stretching during childbirth.  In addition, cystocele is more common after menopause, because the hormone estrogen helps keep the elastic tissues around the pelvic organs strong.  A cystocele is more likely to occur when levels of estrogen decrease.  Other causes include: heavy lifting, chronic  coughing, previous pelvic surgery and obesity.  Symptoms  A bladder that has dropped from its normal position may cause: unwanted urine leakage (stress incontinence), frequent urination or urge to urinate, incomplete emptying of the bladder (not feeling bladder relief after emptying), pain or discomfort in the vagina, pelvis, groin, lower back or lower abdomen and frequent urinary tract infections.  Mild cases may not cause any symptoms.  Treatment Options  Pelvic floor (Kegel) exercises:  Strength training the muscles in your genital area  Behavioral changes: Treating and preventing constipation, taking time to empty your bladder properly, learning to lift properly and/or avoid heavy lifting when possible, stopping smoking, avoiding weight gain and treating a chronic cough or bronchitis.  A pessary: A vaginal support device is sometimes used to help pelvic support caused by muscle and ligament changes.  Surgery: Surgical repair may be necessary if symptoms cannot be managed with exercise, behavioral changes and a pessary.  Surgery is usually considered for severe cases.   2007, [employer]   Patient information: Pelvic muscle (Kegel) exercises (The Basics) View in SpanishWritten by the doctors and editors at [employer]  What are pelvic muscle exercises? - Pelvic muscle exercises are exercises that strengthen the muscles that control the flow of urine and bowel movements. These exercises are also known as "Kegel" exercises.  They can help keep you from leaking urine, gas, or bowel movements, if leaks are  How do I learn how to do Kegel exercises? - First ask your doctor or nurse how to do them right. He or she can help you get started. You will need to learn which muscles to tighten. It is sometimes hard to figure out the right muscles.  A woman might learn to do Kegel exercises by:  ?Putting a finger inside her vagina and squeezing the muscles around her finger; or  ?Pretending  that she is sitting on a marble and has to pick up the marble using her vagina A man might learn to do Kegels by tightening his butt muscles as if he were holding back gas.  Both men and women can also learn to do Kegel exercises by stopping and starting the flow of urine. If you do this, make sure to do this only once or twice to figure out the correct muscles. Some doctors think you should not do this at all, because if you get in the habit of doing it, it could cause a bladder infection.  No matter how you learn to do Kegel exercises, the important thing to know is that the muscles involved are not in your belly or your thighs.  After you learn which muscles to tighten, you can do the exercises in any position (sitting in a chair or lying down). You do not need to do them while you are in the bathroom.   How often should I do the exercises? - Do the exercises 3 times a day, on 3 or 4 days a week. Each time, flex your muscles 8 to 12 times, and hold them tight for 6 to 8 seconds each time you tighten. Keep up this routine for at least 3 to 4 months. You will probably notice results, but it might take a little time.  How do Kegel exercises help? - Kegel exercises can help: ?Reduce urine leaks in people who have "stress incontinence," which means they leak urine when they cough, laugh, sneeze, or strain ?Control sudden urges to urinate that happen to people with "urgency incontinence." (Urgency incontinence is also known as urge incontinence.) ?Control the release of gas or bowel movements ?Reduce pressure or bulging in the vagina caused by pelvic organ prolapse. (If you have a bulge in the vagina, see your doctor or nurse to find out the cause.)  Kegel exercises might also reduce urine leaks in men who have had surgery to treat prostate cancer or an enlarged prostate.

## 2016-07-02 NOTE — Assessment & Plan Note (Signed)
Cystocele with urinary frequency and urgency Normal UA  Gyn referral kegels

## 2016-07-02 NOTE — Progress Notes (Signed)
Subjective:  Patient ID: Rachel Herman, female    DOB: 07/19/1950  Age: 66 y.o. MRN: [id number]  CC: Pelvic Pain   HPI Amberly RIOT BARRICK has diabetes, hx of breast cancer, chronic low back pain, benign R thyroid nodule,  she  presents for    1. Abdominal pain and back pain:  She has history of recurrent abdominal low back pains.    She reports current pain has been present for one month. Pain is located in lower abdomen, rectal and  R upper thigh/groin and R lower back.  She had bloating in abdomen and nausea. She feels constipated at times. She denies emesis, fever and diarrhea. Pain is exacerbated by rising from prolonged sitting. She walks with a cane at time. She has R leg weakness. No dysuria. She has urinary frequency and urgency. She notices bulging for vagina. She reports seeing a clot of blood  from vaginal are about 5 days ago.   Previous evaluation:  CT abdomen and pelvis negative on 07/20/15.  She has a c-scope with polypectomy on 03/30/15, tubular adenomas and hyperplastic polyps recommended repeat c-scope in 3 years.   Abdominal ultrasound 09/07/2015 IMPRESSION: 1. Probable non shadowing 8 mm diameter gallstone. No sonographic evidence of acute cholecystitis. 2. Fatty infiltrative change of the liver. Limited visualization of the pancreatic tail. 3. No acute abnormality observed within the abdomen. 2. Chronic back pain: she is having low back and L sided back pain that is worsening. She is s/p lumbar laminectomy/decompression in 12/2014. She had a repeat lumbar MRI on 11/23/15: IMPRESSION:  Equivocal MRI lumbar spine (without) demonstrating: 1. Mild disc bulging at L2-3 and L3-4. 2. No spinal stenosis or foraminal narrowing.  3. Compared to MRI from 11/11/14, the disc protrusion at L3-4 has been surgically treated.   2. R ear pain: x one month. Once and a while pain. Worse with rubbing. No drainage. No fever or chills.    3. Diabetes: compliant with metformin. No low  CBGs.   Past Surgical History:  Procedure Laterality Date  . ABDOMINAL HYSTERECTOMY N/A 09/20/2013   Procedure: HYSTERECTOMY ABDOMINAL;  Surgeon: Osborne Oman, MD;  Location: [facility];  Service: Gynecology;  Laterality: N/A;  . BREAST LUMPECTOMY WITH AXILLARY LYMPH NODE BIOPSY Left 12/29/13   left breast   . LAPAROSCOPIC ASSISTED VAGINAL HYSTERECTOMY  09/20/2013   Procedure: LAPAROSCOPIC ASSISTED VAGINAL HYSTERECTOMY;  Surgeon: Osborne Oman, MD;  Location: [facility];  Service: Gynecology;;  PT WAS EXAMINED WHILE UP IN [place] AND IT WAS DECIDED TO OPEN PT DUE TO LARGE MASS  . LUMBAR LAMINECTOMY/DECOMPRESSION MICRODISCECTOMY Right 12/14/2014   Procedure: Right Lumbar three-four microdiskectomy;  Surgeon: Newman Pies, MD;  Location: [facility];  Service: Neurosurgery;  Laterality: Right;  Right Lumbar three-four microdiskectomy  . RADIOACTIVE SEED GUIDED MASTECTOMY WITH AXILLARY SENTINEL LYMPH NODE BIOPSY Left 12/29/2013   Procedure: LEFT BREAST RADIOACTIVE SEED LOCALIZED LUMPECTOMY WITH SENTINEL LYMPH NODE MAPPING;  Surgeon: Erroll Luna, MD;  Location: [facility];  Service: General;  Laterality: Left;  . RE-EXCISION OF BREAST LUMPECTOMY Left 03/02/2014   Procedure: RE-EXCISION OF LEFT BREAST LUMPECTOMY;  Surgeon: Erroll Luna, MD;  Location: [facility];  Service: General;  Laterality: Left;  . SALPINGOOPHORECTOMY  09/20/2013   Procedure: SALPINGO OOPHORECTOMY;  Surgeon: Osborne Oman, MD;  Location: [facility];  Service: Gynecology;;   Social History  Substance Use Topics  . Smoking status: Never Smoker  . Smokeless tobacco: Never Used  .  Alcohol use No    Outpatient Medications Prior to Visit  Medication Sig Dispense Refill  . Ascorbic Acid (VITAMIN C) 1000 MG tablet Take 1,000 mg by mouth daily.    Marland Kitchen aspirin 325 MG tablet Take 325 mg by mouth once.    . Blood Glucose Monitoring Suppl (TRUE METRIX METER) w/Device KIT 1 each by Does not apply route  as needed. 1 kit 0  . cloNIDine (CATAPRES) 0.1 MG tablet Take 1 tablet (0.1 mg total) by mouth 2 (two) times daily. 60 tablet 11  . [facility] SENNA PLUS 8.6-50 MG tablet TAKE 2 TABLETS BY MOUTH AT BEDTIME AS NEEDED FOR MILD CONSTIPATION  11  . cyclobenzaprine (FLEXERIL) 10 MG tablet Take 1 tablet (10 mg total) by mouth 3 (three) times daily as needed for muscle spasms. 30 tablet 0  . exemestane (AROMASIN) 25 MG tablet Take 1 tablet (25 mg total) by mouth daily after breakfast. 90 tablet 3  . gabapentin (NEURONTIN) 300 MG capsule 300 mg during the day, 600 mg at night by mouth 270 capsule 4  . glucose blood test strip Use as instructed 100 each 12  . hydrochlorothiazide (HYDRODIURIL) 25 MG tablet TAKE 1 TABLET BY MOUTH EVERY DAY 90 tablet 0  . Lancets (ACCU-CHEK MULTICLIX) lancets 1 each by Other route 3 (three) times daily. ICD 10 E11.9 100 each 12  . metFORMIN (GLUCOPHAGE) 500 MG tablet Take 1 tablet (500 mg total) by mouth 2 (two) times daily. 180 tablet 3  . Multiple Vitamins-Minerals (MULTIVITAMIN WITH MINERALS) tablet Take 1 tablet by mouth daily. Reported on 03/13/2015    . traMADol (ULTRAM) 50 MG tablet On every 4 hours as needed for pan 120 tablet 2  . vitamin D, CHOLECALCIFEROL, 400 UNITS tablet Take 400 Units by mouth daily.    Marland Kitchen albuterol (PROVENTIL HFA;VENTOLIN HFA) 108 (90 Base) MCG/ACT inhaler Inhale 2 puffs into the lungs every 6 (six) hours as needed for wheezing or shortness of breath. (Patient not taking: Reported on 07/02/2016) 1 Inhaler 0  . guaiFENesin-codeine (CHERATUSSIN AC) 100-10 MG/5ML syrup Take 5 mLs by mouth 3 (three) times daily as needed for cough. (Patient not taking: Reported on 07/02/2016) 120 mL 0   No facility-administered medications prior to visit.     ROS Review of Systems  Constitutional: Negative for chills and fever.  HENT: Negative for congestion.   Eyes: Negative for visual disturbance.  Respiratory: Negative for cough and shortness of breath.     Cardiovascular: Negative for chest pain.  Gastrointestinal: Positive for abdominal distention, abdominal pain, nausea and rectal pain. Negative for anal bleeding, blood in stool, constipation and diarrhea.  Genitourinary: Positive for frequency, pelvic pain and urgency.  Musculoskeletal: Positive for arthralgias, back pain, myalgias and neck pain.  Skin: Negative for rash.  Allergic/Immunologic: Negative for immunocompromised state.  Neurological: Positive for headaches. Negative for dizziness.  Hematological: Negative for adenopathy. Does not bruise/bleed easily.  Psychiatric/Behavioral: Negative for dysphoric mood and suicidal ideas.   Lab Results  Component Value Date   HGBA1C 5.9 02/26/2016   CBG 94  Objective:  BP 119/80   Pulse 79   Temp 98.1 F (36.7 C) (Oral)   Wt 216 lb 6.4 oz (98.2 kg)   SpO2 98%   BMI 33.89 kg/m   BP/Weight 07/02/2016 09/12/9167 05/10/386  Systolic BP 828 003 491  Diastolic BP 80 51 80  Wt. (Lbs) 216.4 217.8 217.8  BMI 33.89 34.11 34.11   Physical Exam  Constitutional: She is oriented to  person, place, and time. She appears well-developed and well-nourished. No distress.  HENT:  Head: Normocephalic and atraumatic.  Right Ear: Tympanic membrane, external ear and ear canal normal.  Left Ear: Tympanic membrane, external ear and ear canal normal.  Cardiovascular: Normal rate, regular rhythm, normal heart sounds and intact distal pulses.   Pulmonary/Chest: Effort normal and breath sounds normal.  Abdominal: Soft. Bowel sounds are normal. She exhibits no distension and no mass. There is generalized tenderness. There is no rebound and no guarding.  Obese   Genitourinary: Rectum normal. Rectal exam shows guaiac negative stool (hard stool in rectal vault).     Genitourinary Comments: Internal exam reveals vaginal cuff without erythema or bleeding Uterus is surgically absent  Musculoskeletal: She exhibits no edema.       Lumbar back: She exhibits  decreased range of motion and tenderness. She exhibits no bony tenderness, no swelling, no edema, no deformity, no laceration, no pain, no spasm and normal pulse.  Back Exam: Back: Normal Curvature, no deformities or CVA tenderness  Paraspinal Tenderness: R lumbar   LE Strength 5/5  LE Sensation: in tact  LE Reflexes 2+ and symmetric  Straight leg raise: negative but there is significant pain in R lumbar radiating down lateral thigh with straight leg raise    Neurological: She is alert and oriented to person, place, and time.  Skin: Skin is warm and dry. No rash noted.  Psychiatric: She has a normal mood and affect.   Lab Results  Component Value Date   HGBA1C 5.9 02/26/2016   Lab Results  Component Value Date   HGBA1C 6.0 07/02/2016    CBG 115  UA: normal, negative blood  Assessment & Plan:   Aimi was seen today for pelvic pain.  Diagnoses and all orders for this visit:  Type 2 diabetes mellitus without complication, without long-term current use of insulin (HCC) -     POCT glucose (manual entry) -     HgB A1c -     POCT UA - Microalbumin  Neck pain on right side -     Discontinue: traMADol (ULTRAM) 50 MG tablet; On every 4 hours as needed for pan -     traMADol (ULTRAM) 50 MG tablet; Take 1 tablet (50 mg total) by mouth every 4 (four) hours as needed for moderate pain.  Cystocele without uterine prolapse -     POCT urinalysis dipstick -     Ambulatory referral to Gynecology  Lumbar pain with radiation down right leg -     Ambulatory referral to Neurosurgery -     traMADol (ULTRAM) 50 MG tablet; Take 1 tablet (50 mg total) by mouth every 4 (four) hours as needed for moderate pain. -     DG Lumbar Spine 2-3 Views; Future  Abdominal bloating -     DG Abd 1 View; Future    No orders of the defined types were placed in this encounter.   Follow-up: No Follow-up on file.   Boykin Nearing MD

## 2016-07-02 NOTE — Assessment & Plan Note (Signed)
Patient has chronic low back pain with several surgeries in the past Now with R sided symptoms Plan: Lumbar x-ray Continue tramadol for chronic pain control Referral back to her neurosurgeon for f/u

## 2016-07-10 ENCOUNTER — Ambulatory Visit (HOSPITAL_COMMUNITY)
Admission: RE | Admit: 2016-07-10 | Discharge: 2016-07-10 | Disposition: A | Discharge status: Home / Self Care | Payer: Medicare Other | Source: Ambulatory Visit | Attending: Family Medicine | Admitting: Family Medicine

## 2016-07-10 DIAGNOSIS — R140 Abdominal distension (gaseous): Secondary | ICD-10-CM | POA: Diagnosis not present

## 2016-07-10 DIAGNOSIS — K5900 Constipation, unspecified: Secondary | ICD-10-CM | POA: Diagnosis not present

## 2016-07-10 DIAGNOSIS — M545 Low back pain: Secondary | ICD-10-CM | POA: Insufficient documentation

## 2016-07-10 DIAGNOSIS — M79604 Pain in right leg: Secondary | ICD-10-CM

## 2016-07-10 DIAGNOSIS — M48061 Spinal stenosis, lumbar region without neurogenic claudication: Secondary | ICD-10-CM | POA: Insufficient documentation

## 2016-07-11 ENCOUNTER — Telehealth: Payer: Self-pay

## 2016-07-11 ENCOUNTER — Other Ambulatory Visit: Payer: Self-pay | Admitting: Family Medicine

## 2016-07-11 DIAGNOSIS — K5900 Constipation, unspecified: Secondary | ICD-10-CM

## 2016-07-11 MED ORDER — POLYETHYLENE GLYCOL 3350 17 GM/SCOOP PO POWD: 17.0000 g | Freq: Every day | ORAL | 1 refills | Status: DC

## 2016-07-11 MED ORDER — CVS SENNA PLUS 8.6-50 MG PO TABS: ORAL_TABLET | ORAL | 11 refills | Status: DC

## 2016-07-11 NOTE — Telephone Encounter (Signed)
Pt was called and informed of X-ray results. 

## 2016-07-22 ENCOUNTER — Encounter: Payer: Self-pay | Admitting: Obstetrics & Gynecology

## 2016-07-22 ENCOUNTER — Other Ambulatory Visit: Payer: Self-pay | Admitting: Family Medicine

## 2016-07-22 ENCOUNTER — Telehealth: Payer: Self-pay | Admitting: Family Medicine

## 2016-07-22 DIAGNOSIS — R1084 Generalized abdominal pain: Secondary | ICD-10-CM

## 2016-07-22 NOTE — Telephone Encounter (Signed)
Pt calling to request a refill of heartburn med. States that she was put on a new one for 30 days and she did not like it and would like to go back to taking the other med that she took before. Please f/u with pt.

## 2016-07-25 NOTE — Telephone Encounter (Signed)
Ranitidine has been refilled

## 2016-08-04 ENCOUNTER — Other Ambulatory Visit: Payer: Self-pay | Admitting: Family Medicine

## 2016-08-04 DIAGNOSIS — I152 Hypertension secondary to endocrine disorders: Secondary | ICD-10-CM

## 2016-08-20 ENCOUNTER — Ambulatory Visit: Payer: Medicare Other | Admitting: Obstetrics & Gynecology

## 2016-08-21 ENCOUNTER — Ambulatory Visit (INDEPENDENT_AMBULATORY_CARE_PROVIDER_SITE_OTHER): Payer: Medicare Other | Admitting: Obstetrics & Gynecology

## 2016-08-21 ENCOUNTER — Encounter: Payer: Self-pay | Admitting: Obstetrics & Gynecology

## 2016-08-21 VITALS — BP 125/67 | HR 82 | Wt 207.1 lb

## 2016-08-21 DIAGNOSIS — N813 Complete uterovaginal prolapse: Secondary | ICD-10-CM | POA: Diagnosis not present

## 2016-08-21 DIAGNOSIS — N993 Prolapse of vaginal vault after hysterectomy: Secondary | ICD-10-CM

## 2016-08-21 NOTE — Patient Instructions (Signed)
Appointment with Dr. [name] [phone] on September 19th at 10:30 am.  Address is [address], [place], [place]

## 2016-08-21 NOTE — Progress Notes (Signed)
   Subjective:    Patient ID: Rachel Herman, female    DOB: October 04, 1950, 66 y.o.   MRN: [id number]  HPI 66 yo MW P3 here with a 6 month issue of a bulge in her vagina. She finds it difficult to empty her bladder completely. She denies any issues with BMs. 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  She reports a 3 week h/o some urinary leaking, wears sanitary napkins when going out to church.  Review of Systems     Objective:   Physical Exam  [id number] Breathing, conversing, and ambulating normally Abd- obese, benign Grade 4 cystocele, Grade 2 rectocele      Assessment & Plan:  Symptomatic prolapse of vaginal cuff, bladder, and rectum I offered her a pessary versus a referral to Dr. Maryland Herman. She prefers a referral.

## 2016-08-21 NOTE — Progress Notes (Signed)
Scheduled with Dr. [name] on September 19th @ 1030.  Pt notified.

## 2016-08-29 ENCOUNTER — Other Ambulatory Visit: Payer: Self-pay | Admitting: Family Medicine

## 2016-08-29 ENCOUNTER — Ambulatory Visit: Payer: Medicare Other | Attending: Family Medicine | Admitting: Family Medicine

## 2016-08-29 ENCOUNTER — Ambulatory Visit
Admission: RE | Admit: 2016-08-29 | Discharge: 2016-08-29 | Disposition: A | Discharge status: Home / Self Care | Payer: Medicare Other | Source: Ambulatory Visit | Attending: Family Medicine | Admitting: Family Medicine

## 2016-08-29 ENCOUNTER — Encounter: Payer: Self-pay | Admitting: Family Medicine

## 2016-08-29 ENCOUNTER — Telehealth: Payer: Self-pay | Admitting: Family Medicine

## 2016-08-29 VITALS — BP 113/77 | HR 76 | Temp 98.0°F | Ht 67.0 in | Wt 202.6 lb

## 2016-08-29 DIAGNOSIS — M25551 Pain in right hip: Secondary | ICD-10-CM | POA: Insufficient documentation

## 2016-08-29 DIAGNOSIS — Z7984 Long term (current) use of oral hypoglycemic drugs: Secondary | ICD-10-CM | POA: Diagnosis not present

## 2016-08-29 DIAGNOSIS — I10 Essential (primary) hypertension: Secondary | ICD-10-CM | POA: Diagnosis not present

## 2016-08-29 DIAGNOSIS — M542 Cervicalgia: Secondary | ICD-10-CM

## 2016-08-29 DIAGNOSIS — R05 Cough: Secondary | ICD-10-CM | POA: Insufficient documentation

## 2016-08-29 DIAGNOSIS — E041 Nontoxic single thyroid nodule: Secondary | ICD-10-CM | POA: Insufficient documentation

## 2016-08-29 DIAGNOSIS — N993 Prolapse of vaginal vault after hysterectomy: Secondary | ICD-10-CM | POA: Insufficient documentation

## 2016-08-29 DIAGNOSIS — M545 Low back pain: Secondary | ICD-10-CM | POA: Insufficient documentation

## 2016-08-29 DIAGNOSIS — G8929 Other chronic pain: Secondary | ICD-10-CM | POA: Diagnosis not present

## 2016-08-29 DIAGNOSIS — I152 Hypertension secondary to endocrine disorders: Secondary | ICD-10-CM | POA: Diagnosis not present

## 2016-08-29 DIAGNOSIS — Z853 Personal history of malignant neoplasm of breast: Secondary | ICD-10-CM | POA: Diagnosis not present

## 2016-08-29 DIAGNOSIS — M1611 Unilateral primary osteoarthritis, right hip: Secondary | ICD-10-CM | POA: Diagnosis not present

## 2016-08-29 DIAGNOSIS — R1084 Generalized abdominal pain: Secondary | ICD-10-CM

## 2016-08-29 DIAGNOSIS — M549 Dorsalgia, unspecified: Secondary | ICD-10-CM | POA: Diagnosis present

## 2016-08-29 DIAGNOSIS — K5900 Constipation, unspecified: Secondary | ICD-10-CM | POA: Diagnosis not present

## 2016-08-29 DIAGNOSIS — E119 Type 2 diabetes mellitus without complications: Secondary | ICD-10-CM | POA: Diagnosis not present

## 2016-08-29 DIAGNOSIS — Z7982 Long term (current) use of aspirin: Secondary | ICD-10-CM | POA: Diagnosis not present

## 2016-08-29 DIAGNOSIS — M79604 Pain in right leg: Secondary | ICD-10-CM

## 2016-08-29 MED ORDER — RANITIDINE HCL 150 MG PO TABS: 150.0000 mg | ORAL_TABLET | Freq: Two times a day (BID) | ORAL | 3 refills | Status: DC

## 2016-08-29 MED ORDER — GABAPENTIN 300 MG PO CAPS: ORAL_CAPSULE | ORAL | 4 refills | Status: DC

## 2016-08-29 MED ORDER — HYDROCHLOROTHIAZIDE 25 MG PO TABS: 25.0000 mg | ORAL_TABLET | Freq: Every day | ORAL | 3 refills | Status: DC

## 2016-08-29 MED ORDER — TRAMADOL HCL 50 MG PO TABS
50.0000 mg | ORAL_TABLET | ORAL | 2 refills | Status: DC | PRN
Start: 2016-08-29 — End: 2017-01-02

## 2016-08-29 MED ORDER — METFORMIN HCL 500 MG PO TABS: 500.0000 mg | ORAL_TABLET | Freq: Two times a day (BID) | ORAL | 3 refills | Status: DC

## 2016-08-29 MED ORDER — CLONIDINE HCL 0.1 MG PO TABS: 0.1000 mg | ORAL_TABLET | Freq: Two times a day (BID) | ORAL | 3 refills | Status: DC

## 2016-08-29 NOTE — Assessment & Plan Note (Signed)
Refilled tramadol X-ray of R hip She will also fu with neurosurgery

## 2016-08-29 NOTE — Telephone Encounter (Signed)
[facility] Left [facility] Discontinued lisinopril

## 2016-08-29 NOTE — Progress Notes (Signed)
Subjective:  Patient ID: Rachel Herman, female    DOB: 1950/06/21  Age: 66 y.o. MRN: [id number]  CC: Back Pain   HPI Rachel Herman has diabetes, hx of breast cancer, chronic low back pain, benign R thyroid nodule,  she  presents for    1.  Prolapse of vaginal vault after hysterectomy: she has seen gynecology. Has opted to see uro-gynecology instead of pessary trial. She was an appointment in September. She has constipation. She reports prune juice currently works very well.   2. R hip pain: for past 2-3 months. She has chronic R sided long back pain. Pain is worse after sitting on riding lawn more. She feel likes her R leg gives out of her. No falls. She now walks with a cane.   3.  Cough: she stopped lisinopril and replaced it with clonidine. This improved her cough. She still has some SOB on exertion. No chest pain or leg swelling. She is no longer using albuterol.   She request medication refills today.    Social History  Substance Use Topics  . Smoking status: Never Smoker  . Smokeless tobacco: Never Used  . Alcohol use No   Past Surgical History:  Procedure Laterality Date  . ABDOMINAL HYSTERECTOMY N/A 09/20/2013   Procedure: HYSTERECTOMY ABDOMINAL;  Surgeon: Osborne Oman, MD;  Location: [facility];  Service: Gynecology;  Laterality: N/A;  . BREAST LUMPECTOMY WITH AXILLARY LYMPH NODE BIOPSY Left 12/29/13   left breast   . LAPAROSCOPIC ASSISTED VAGINAL HYSTERECTOMY  09/20/2013   Procedure: LAPAROSCOPIC ASSISTED VAGINAL HYSTERECTOMY;  Surgeon: Osborne Oman, MD;  Location: [facility];  Service: Gynecology;;  PT WAS EXAMINED WHILE UP IN STIRRUPS AND IT WAS DECIDED TO OPEN PT DUE TO LARGE MASS  . LUMBAR LAMINECTOMY/DECOMPRESSION MICRODISCECTOMY Right 12/14/2014   Procedure: Right Lumbar three-four microdiskectomy;  Surgeon: Newman Pies, MD;  Location: [facility];  Service: Neurosurgery;  Laterality: Right;  Right Lumbar three-four microdiskectomy  . RADIOACTIVE SEED  GUIDED MASTECTOMY WITH AXILLARY SENTINEL LYMPH NODE BIOPSY Left 12/29/2013   Procedure: LEFT BREAST RADIOACTIVE SEED LOCALIZED LUMPECTOMY WITH SENTINEL LYMPH NODE MAPPING;  Surgeon: Erroll Luna, MD;  Location: [facility];  Service: General;  Laterality: Left;  . RE-EXCISION OF BREAST LUMPECTOMY Left 03/02/2014   Procedure: RE-EXCISION OF LEFT BREAST LUMPECTOMY;  Surgeon: Erroll Luna, MD;  Location: [facility];  Service: General;  Laterality: Left;  . SALPINGOOPHORECTOMY  09/20/2013   Procedure: SALPINGO OOPHORECTOMY;  Surgeon: Osborne Oman, MD;  Location: [facility];  Service: Gynecology;;   Social History  Substance Use Topics  . Smoking status: Never Smoker  . Smokeless tobacco: Never Used  . Alcohol use No    Outpatient Medications Prior to Visit  Medication Sig Dispense Refill  . Ascorbic Acid (VITAMIN C) 1000 MG tablet Take 1,000 mg by mouth daily.    Marland Kitchen aspirin 325 MG tablet Take 325 mg by mouth once.    . Blood Glucose Monitoring Suppl (TRUE METRIX METER) w/Device KIT 1 each by Does not apply route as needed. 1 kit 0  . cloNIDine (CATAPRES) 0.1 MG tablet Take 1 tablet (0.1 mg total) by mouth 2 (two) times daily. 60 tablet 11  . [employer] SENNA PLUS 8.6-50 MG tablet TAKE 2 TABLETS BY MOUTH AT BEDTIME AS NEEDED FOR MILD CONSTIPATION 60 tablet 11  . cyclobenzaprine (FLEXERIL) 10 MG tablet Take 1 tablet (10 mg total) by mouth 3 (three) times daily as needed for muscle  spasms. 30 tablet 0  . exemestane (AROMASIN) 25 MG tablet Take 1 tablet (25 mg total) by mouth daily after breakfast. 90 tablet 3  . gabapentin (NEURONTIN) 300 MG capsule 300 mg during the day, 600 mg at night by mouth 270 capsule 4  . glucose blood test strip Use as instructed 100 each 12  . hydrochlorothiazide (HYDRODIURIL) 25 MG tablet TAKE 1 TABLET BY MOUTH EVERY DAY 90 tablet 0  . Lancets (ACCU-CHEK MULTICLIX) lancets 1 each by Other route 3 (three) times daily. ICD 10 E11.9 100 each 12  .  metFORMIN (GLUCOPHAGE) 500 MG tablet Take 1 tablet (500 mg total) by mouth 2 (two) times daily. 180 tablet 3  . Multiple Vitamins-Minerals (MULTIVITAMIN WITH MINERALS) tablet Take 1 tablet by mouth daily. Reported on 03/13/2015    . polyethylene glycol powder (GLYCOLAX/MIRALAX) powder Take 17 g by mouth daily. 3350 g 1  . ranitidine (ZANTAC) 150 MG tablet TAKE 1 TABLET BY MOUTH TWICE A DAY 60 tablet 2  . traMADol (ULTRAM) 50 MG tablet Take 1 tablet (50 mg total) by mouth every 4 (four) hours as needed for moderate pain. 120 tablet 2  . vitamin D, CHOLECALCIFEROL, 400 UNITS tablet Take 400 Units by mouth daily.    Marland Kitchen albuterol (PROVENTIL HFA;VENTOLIN HFA) 108 (90 Base) MCG/ACT inhaler Inhale 2 puffs into the lungs every 6 (six) hours as needed for wheezing or shortness of breath. (Patient not taking: Reported on 07/02/2016) 1 Inhaler 0   No facility-administered medications prior to visit.     ROS Review of Systems  Constitutional: Negative for chills and fever.  HENT: Negative for congestion.   Eyes: Negative for visual disturbance.  Respiratory: Negative for cough and shortness of breath.   Cardiovascular: Negative for chest pain.  Gastrointestinal: Positive for abdominal distention, abdominal pain, nausea and rectal pain. Negative for anal bleeding, blood in stool, constipation and diarrhea.  Genitourinary: Positive for frequency, pelvic pain and urgency.  Musculoskeletal: Positive for arthralgias, back pain, myalgias and neck pain.  Skin: Negative for rash.  Allergic/Immunologic: Negative for immunocompromised state.  Neurological: Positive for headaches. Negative for dizziness.  Hematological: Negative for adenopathy. Does not bruise/bleed easily.  Psychiatric/Behavioral: Negative for dysphoric mood and suicidal ideas.   Lab Results  Component Value Date   HGBA1C 6.0 07/02/2016    Objective:  BP 113/77   Pulse 76   Temp 98 F (36.7 C) (Oral)   Ht '5\' 7"'  (1.702 m)   Wt 202 lb 9.6  oz (91.9 kg)   SpO2 98%   BMI 31.73 kg/m   BP/Weight 08/29/2016 08/21/2016 8/67/6195  Systolic BP 093 267 124  Diastolic BP 77 67 80  Wt. (Lbs) 202.6 207.1 216.4  BMI 31.73 32.44 33.89   Physical Exam  Constitutional: She is oriented to person, place, and time. She appears well-developed and well-nourished. No distress.  HENT:  Head: Normocephalic and atraumatic.  Pulmonary/Chest: Effort normal.  Musculoskeletal: She exhibits no edema.       Right hip: She exhibits decreased range of motion, tenderness and bony tenderness. She exhibits normal strength, no swelling, no crepitus and no laceration.  Pain with FABER and log roll in her posterior, lateral and anterior hip   Neurological: She is alert and oriented to person, place, and time.  Skin: Skin is warm and dry. No rash noted.  Psychiatric: She has a normal mood and affect.    Lab Results  Component Value Date   HGBA1C 6.0 07/02/2016  Assessment & Plan:   Rachel Herman was seen today for back pain.  Diagnoses and all orders for this visit:  Neck pain on right side -     traMADol (ULTRAM) 50 MG tablet; Take 1 tablet (50 mg total) by mouth every 4 (four) hours as needed for moderate pain.  Lumbar pain with radiation down right leg -     traMADol (ULTRAM) 50 MG tablet; Take 1 tablet (50 mg total) by mouth every 4 (four) hours as needed for moderate pain. -     gabapentin (NEURONTIN) 300 MG capsule; 300 mg during the day, 600 mg at night by mouth  Hypertension due to endocrine disorder -     hydrochlorothiazide (HYDRODIURIL) 25 MG tablet; Take 1 tablet (25 mg total) by mouth daily. -     cloNIDine (CATAPRES) 0.1 MG tablet; Take 1 tablet (0.1 mg total) by mouth 2 (two) times daily.  Generalized abdominal pain -     ranitidine (ZANTAC) 150 MG tablet; Take 1 tablet (150 mg total) by mouth 2 (two) times daily.  Type 2 diabetes mellitus without complication, without long-term current use of insulin (HCC) -     metFORMIN  (GLUCOPHAGE) 500 MG tablet; Take 1 tablet (500 mg total) by mouth 2 (two) times daily.  Right hip pain -     DG HIP UNILAT WITH PELVIS MIN 4 VIEWS RIGHT; Future  Essential hypertension    No orders of the defined types were placed in this encounter.   Follow-up: Return in about 8 weeks (around 10/24/2016) for R hip pain, f/u urogynecology appointment.   Boykin Nearing MD

## 2016-08-29 NOTE — Patient Instructions (Addendum)
Rachel Herman was seen today for back pain.  Diagnoses and all orders for this visit:  Neck pain on right side -     traMADol (ULTRAM) 50 MG tablet; Take 1 tablet (50 mg total) by mouth every 4 (four) hours as needed for moderate pain.  Lumbar pain with radiation down right leg -     traMADol (ULTRAM) 50 MG tablet; Take 1 tablet (50 mg total) by mouth every 4 (four) hours as needed for moderate pain. -     gabapentin (NEURONTIN) 300 MG capsule; 300 mg during the day, 600 mg at night by mouth  Hypertension due to endocrine disorder -     hydrochlorothiazide (HYDRODIURIL) 25 MG tablet; Take 1 tablet (25 mg total) by mouth daily. -     cloNIDine (CATAPRES) 0.1 MG tablet; Take 1 tablet (0.1 mg total) by mouth 2 (two) times daily.  Generalized abdominal pain -     ranitidine (ZANTAC) 150 MG tablet; Take 1 tablet (150 mg total) by mouth 2 (two) times daily.  Type 2 diabetes mellitus without complication, without long-term current use of insulin (HCC) -     metFORMIN (GLUCOPHAGE) 500 MG tablet; Take 1 tablet (500 mg total) by mouth 2 (two) times daily.  Right hip pain -     DG HIP UNILAT WITH PELVIS MIN 4 VIEWS RIGHT; Future   F/u in 8 weeks for flu shot/meet new PCP/ follow up urogynecology appointment   Dr. Adrian Blackwater

## 2016-08-29 NOTE — Addendum Note (Signed)
Addended by: Boykin Nearing on: 08/29/2016 05:21 PM   Modules accepted: Orders

## 2016-08-29 NOTE — Assessment & Plan Note (Signed)
A: well controlled off ACE inhibitor which was stopped due to cough. Now taking clonidine. P: continue HCTZ and clonidine

## 2016-08-29 NOTE — Assessment & Plan Note (Signed)
There is a moderate amount of arthritis on x-ray Ortho referral placed

## 2016-08-30 ENCOUNTER — Telehealth: Payer: Self-pay

## 2016-08-30 NOTE — Telephone Encounter (Signed)
Pt was called and a message was left informing pt to return phone call for lab results. If lt returns phone call please inform pt:    Hip x-ray reveals a moderate amount of arthritis  Ortho referral placed. Pt will be contacted in 2 weeks.

## 2016-09-11 ENCOUNTER — Encounter (INDEPENDENT_AMBULATORY_CARE_PROVIDER_SITE_OTHER): Payer: Self-pay | Admitting: Orthopedic Surgery

## 2016-09-11 ENCOUNTER — Ambulatory Visit (INDEPENDENT_AMBULATORY_CARE_PROVIDER_SITE_OTHER): Payer: Medicare Other | Admitting: Orthopedic Surgery

## 2016-09-11 DIAGNOSIS — M25551 Pain in right hip: Secondary | ICD-10-CM

## 2016-09-11 NOTE — Progress Notes (Signed)
Office Visit Note   Patient: Rachel Herman           Date of Birth: 1950-11-04           MRN: [id number] Visit Date: 09/11/2016 Requested by: Boykin Nearing, MD [address] [place], [place] [address] PCP: Boykin Nearing, MD  Subjective: Chief Complaint  Patient presents with  . Right Hip - Pain  . Lower Back - Pain    HPI: Rachel Herman is a 66 year old patient with right hip pain.  Has had pain for 2-3 months.  Has a history of back surgery with Dr. Arnoldo Morale in 2016.  Has had radiographic degrees were imaging which are reviewed.  Fairly minimal arthritis in bilateral hips.  Lumbar spine shows degenerative disc disease at L3-4 but that's pretty minimal.  She states the pain is worse with walking.  Localizes the pain in the buttock area with occasional radiation into the groin but does run down the leg.  Denies any numbness and tingling.              ROS: All systems reviewed are negative as they relate to the chief complaint within the history of present illness.  Patient denies  fevers or chills.   Assessment & Plan: Visit Diagnoses:  1. Pain in right hip     Plan: Impression is right hip pain which looks to be coming from the back.  We will try a right hip injection with Dr. Ernestina Patches for diagnostic and therapeutic purposes.  Radiographs show fairly minimal degenerative arthritis in her examination does not show a particularly irritable right hip.  Follow-Up Instructions: No Follow-up on file.   Orders:  Orders Placed This Encounter  Procedures  . Ambulatory referral to Physical Medicine Rehab   No orders of the defined types were placed in this encounter.     Procedures: No procedures performed   Clinical Data: No additional findings.  Objective: Vital Signs: There were no vitals taken for this visit.  Physical Exam:   Constitutional: Patient appears well-developed HEENT:  Head: Normocephalic Eyes:EOM are normal Neck: Normal range of  motion Cardiovascular: Normal rate Pulmonary/chest: Effort normal Neurologic: Patient is alert Skin: Skin is warm Psychiatric: Patient has normal mood and affect    Ortho Exam: Orthopedic exam demonstrates mildly positive nerve retention zone on the right..  Positive pedal pulses.  Ankle dorsi flexion plantar flexion quite hamstring strength is intact.  No groin pain with internal/external rotation of the right leg is noted.  Hip strength including flexion abduction and adduction is symmetric between sides.  Only mild tenderness over the right trochanteric bursa is noted  Specialty Comments:  No specialty comments available.  Imaging: No results found.   PMFS History: Patient Active Problem List   Diagnosis Date Noted  . Right hip pain 08/29/2016  . 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 Lumbar pain with radiation down right leg 07/02/2016  . Positive H. pylori test 02/27/2016  . Intercostal pain 02/26/2016  . Thyroid nodule 06/09/2015  . Neck pain on right side 05/24/2015  . Lumbar stenosis with neurogenic claudication 12/15/2014  . Obesity (BMI 30-39.9) 09/08/2014  . Nodule of right lung 07/28/2014  . Diverticulosis of colon without hemorrhage 07/28/2014  . Depressed mood 03/09/2014  . DJD (degenerative joint disease), lumbar 02/22/2014  . Lumbar pain with radiation down left leg 01/17/2014  . Breast cancer of upper-inner quadrant of left female breast ([facility]) 01/05/2014  . Hyperlipidemia associated with type 2 diabetes  mellitus ([facility]) 09/28/2013  . S/P total hysterectomy and bilateral salpingo-oophorectomy 09/20/2013  . Morbid obesity with body mass index of 40.0-44.9 in adult ([facility] [place]) 09/17/2013  . Essential hypertension   . Diabetes mellitus ([facility]) 09/16/2013   Past Medical History:  Diagnosis Date  . Breast cancer ([facility]) 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  . Diabetes mellitus ([facility]) 09/16/13   Diagnosed on 09/16/13; HgA1C was 7.2/metformin   . Dizziness   . GERD (gastroesophageal reflux disease)   . Headache   . Hyperlipidemia   . Hypertension 2014  . Low back pain   . Obesity, morbid, BMI 40.0-49.9 ([facility])   . PONV (postoperative nausea and vomiting)   . S/P radiation therapy 04/05/14-05/20/14   left breast 60.4Gy totaldose    Family History  Problem Relation Age of Onset  . Diabetes Mother   . Multiple myeloma Mother 74  . Hearing loss Mother   . Diabetes Brother   . Cancer Brother 40       prostate cancer   . Diabetes Maternal Aunt   . Leukemia Maternal Aunt        dx in her 39s  . Alcoholism Brother   . Heart attack Father   . Diabetes Sister   . Diabetes Son     Past Surgical History:  Procedure Laterality Date  . ABDOMINAL HYSTERECTOMY N/A 09/20/2013   Procedure: HYSTERECTOMY ABDOMINAL;  Surgeon: Osborne Oman, MD;  Location: [facility];  Service: Gynecology;  Laterality: N/A;  . BREAST LUMPECTOMY WITH AXILLARY LYMPH NODE BIOPSY Left 12/29/13   left breast   . LAPAROSCOPIC ASSISTED VAGINAL HYSTERECTOMY  09/20/2013   Procedure: LAPAROSCOPIC ASSISTED VAGINAL HYSTERECTOMY;  Surgeon: Osborne Oman, MD;  Location: [facility];  Service: Gynecology;;  PT WAS EXAMINED WHILE UP IN STIRRUPS AND IT WAS DECIDED TO OPEN PT DUE TO LARGE MASS  . LUMBAR LAMINECTOMY/DECOMPRESSION MICRODISCECTOMY Right 12/14/2014   Procedure: Right Lumbar three-four microdiskectomy;  Surgeon: Newman Pies, MD;  Location: [facility];  Service: Neurosurgery;  Laterality: Right;  Right Lumbar three-four microdiskectomy  . RADIOACTIVE SEED GUIDED MASTECTOMY WITH AXILLARY SENTINEL LYMPH NODE BIOPSY Left 12/29/2013   Procedure: LEFT BREAST RADIOACTIVE SEED LOCALIZED LUMPECTOMY WITH SENTINEL LYMPH NODE MAPPING;  Surgeon: Erroll Luna, MD;  Location: [facility];  Service: General;  Laterality: Left;  . RE-EXCISION OF BREAST LUMPECTOMY Left 03/02/2014   Procedure: RE-EXCISION OF LEFT BREAST LUMPECTOMY;  Surgeon: Erroll Luna, MD;   Location: [facility];  Service: General;  Laterality: Left;  . SALPINGOOPHORECTOMY  09/20/2013   Procedure: SALPINGO OOPHORECTOMY;  Surgeon: Osborne Oman, MD;  Location: [facility];  Service: Gynecology;;   Social History   Occupational History  .  Unemployed   Social History Main Topics  . Smoking status: Never Smoker  . Smokeless tobacco: Never Used  . Alcohol use No  . Drug use: No  . Sexual activity: Not Currently    Birth control/ protection: Post-menopausal

## 2016-09-13 ENCOUNTER — Telehealth: Payer: Self-pay | Admitting: Family Medicine

## 2016-09-13 DIAGNOSIS — E1169 Type 2 diabetes mellitus with other specified complication: Secondary | ICD-10-CM

## 2016-09-13 DIAGNOSIS — E785 Hyperlipidemia, unspecified: Principal | ICD-10-CM

## 2016-09-13 MED ORDER — PRAVASTATIN SODIUM 20 MG PO TABS: 20.0000 mg | ORAL_TABLET | Freq: Every day | ORAL | 3 refills | Status: DC

## 2016-09-13 NOTE — Assessment & Plan Note (Signed)
Adding pravastatin 20 mg daily Patient has not tolerated lipitor 40 or 20 mg in the past due to muscle aches

## 2016-09-13 NOTE — Telephone Encounter (Signed)
Please inform patient that her insurance company did a review and have recommended statin therapy since she has diabetes.    She has tried 20 and 40 mg of Lipitor in the past but this was stopped due to muscle aches.  I would like her to try pravastatin 20 mg which is a low dose of one of the most tolerated statins at 20 mg daily. I have sent this to her pharmacy

## 2016-09-16 ENCOUNTER — Telehealth: Payer: Self-pay | Admitting: *Deleted

## 2016-09-16 ENCOUNTER — Other Ambulatory Visit: Payer: Self-pay | Admitting: *Deleted

## 2016-09-16 DIAGNOSIS — Z170 Estrogen receptor positive status [ER+]: Principal | ICD-10-CM

## 2016-09-16 DIAGNOSIS — C50212 Malignant neoplasm of upper-inner quadrant of left female breast: Secondary | ICD-10-CM

## 2016-09-16 MED ORDER — EXEMESTANE 25 MG PO TABS: 25.0000 mg | ORAL_TABLET | Freq: Every day | ORAL | 3 refills | Status: DC

## 2016-09-16 NOTE — Telephone Encounter (Signed)
Received call from [facility] requesting refill of Aromasin.   Refill signed by Dr. Burr Medico, and faxed to [facility]. [employer]  PAP        Phone      [phone]       ;    Fax        [phone].

## 2016-09-30 ENCOUNTER — Ambulatory Visit (INDEPENDENT_AMBULATORY_CARE_PROVIDER_SITE_OTHER): Payer: Medicare Other | Admitting: Physical Medicine and Rehabilitation

## 2016-09-30 ENCOUNTER — Ambulatory Visit (INDEPENDENT_AMBULATORY_CARE_PROVIDER_SITE_OTHER): Payer: Medicare Other

## 2016-09-30 DIAGNOSIS — M25551 Pain in right hip: Secondary | ICD-10-CM

## 2016-09-30 NOTE — [place] Notes (Signed)
Rachel Herman - 66 y.o. female MRN [id number]  Date of birth: 07-19-1950  Office Visit Note: Visit Date: 09/30/2016 PCP: Boykin Nearing, MD Referred by: Boykin Nearing, MD  Subjective: Chief Complaint  Patient presents with  . Right Hip - Pain   HPI: Mrs. Mcquitty is a 66 year old female with lumbar spine history but he comes in today with right hip pain for 3-4 months which is fairly constant. It's worse with increased activity such as yard work. She feels a pulling sensation into the groin at times. X-ray imaging from July shows moderate degenerative hip arthritis. Dr. Marlou Sa request diagnostic of therapeutic anesthetic hip arthrogram.    ROS Otherwise per HPI.  Assessment & Plan: Visit Diagnoses:  1. Pain in right hip     Plan: Findings:  Diagnostic and hopefully therapeutic anesthetic hip arthrogram on the right. Patient did have relief during the anesthetic phase of the injection.    Meds & Orders: No orders of the defined types were placed in this encounter.   Orders Placed This Encounter  Procedures  . Large Joint Injection/Arthrocentesis  . XR C-ARM NO REPORT    Follow-up: Return in about 4 weeks (around 10/28/2016), or Dr. Marlou Sa.   Procedures: Diagnostic and hopefully therapeutic anesthetic hip arthrogram Date/Time: 09/30/2016 10:22 AM Performed by: Magnus Sinning Authorized by: Magnus Sinning   Consent Given by:  Patient Site marked: the procedure site was marked   Timeout: prior to procedure the correct patient, procedure, and site was verified   Indications:  Pain and diagnostic evaluation Location:  Hip Site:  R hip joint Prep: patient was prepped and draped in usual sterile fashion   Needle Size:  22 G Approach:  Anterior Ultrasound Guidance: No   Fluoroscopic Guidance: No   Arthrogram: Yes   Medications:  3 mL bupivacaine 0.5 %; 80 mg triamcinolone acetonide 40 MG/ML Aspiration Attempted: Yes   Patient tolerance:  Patient tolerated the  procedure well with no immediate complications  Arthrogram demonstrated excellent flow of contrast throughout the joint surface without extravasation or obvious defect.  The patient had relief of symptoms during the anesthetic phase of the injection.      No notes on file   Clinical History: No specialty comments available.  She reports that she has never smoked. She has never used smokeless tobacco.   Recent Labs  02/26/16 1006 07/02/16 1221  HGBA1C 5.9 6.0    Objective:  VS:  HT:    WT:   BMI:     BP:   HR: bpm  TEMP: ( )  RESP:  Physical Exam  Musculoskeletal:  Patient ambulates with a cane with antalgic gait. She does have pain with hip rotation on the right internally.    Ortho Exam Imaging: No results found.  Past Medical/Family/Surgical/Social History: Medications & Allergies reviewed per EMR Patient Active Problem List   Diagnosis Date Noted  . Right hip pain 08/29/2016  . 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 Lumbar pain with radiation down right leg 07/02/2016  . Positive H. pylori test 02/27/2016  . Intercostal pain 02/26/2016  . Thyroid nodule 06/09/2015  . Neck pain on right side 05/24/2015  . Lumbar stenosis with neurogenic claudication 12/15/2014  . Obesity (BMI 30-39.9) 09/08/2014  . Nodule of right lung 07/28/2014  . Diverticulosis of colon without hemorrhage 07/28/2014  . Depressed mood 03/09/2014  . DJD (degenerative joint disease), lumbar 02/22/2014  . Lumbar pain with radiation down left leg 01/17/2014  .  Breast cancer of upper-inner quadrant of left female breast ([facility]) 01/05/2014  . Hyperlipidemia associated with type 2 diabetes mellitus ([facility]) 09/28/2013  . S/P total hysterectomy and bilateral salpingo-oophorectomy 09/20/2013  . Morbid obesity with body mass index of 40.0-44.9 in adult ([place] [facility]) 09/17/2013  . Essential hypertension   . Diabetes mellitus ([facility]) 09/16/2013   Past Medical History:  Diagnosis Date  .  Breast cancer ([facility]) 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  . Diabetes mellitus ([facility]) 09/16/13   Diagnosed on 09/16/13; HgA1C was 7.2/metformin  . Dizziness   . GERD (gastroesophageal reflux disease)   . Headache   . Hyperlipidemia   . Hypertension 2014  . Low back pain   . Obesity, morbid, BMI 40.0-49.9 ([facility])   . PONV (postoperative nausea and vomiting)   . S/P radiation therapy 04/05/14-05/20/14   left breast 60.4Gy totaldose   Family History  Problem Relation Age of Onset  . Diabetes Mother   . Multiple myeloma Mother 43  . Hearing loss Mother   . Diabetes Brother   . Cancer Brother 40       prostate cancer   . Diabetes Maternal Aunt   . Leukemia Maternal Aunt        dx in her 39s  . Alcoholism Brother   . Heart attack Father   . Diabetes Sister   . Diabetes Son    Past Surgical History:  Procedure Laterality Date  . ABDOMINAL HYSTERECTOMY N/A 09/20/2013   Procedure: HYSTERECTOMY ABDOMINAL;  Surgeon: Osborne Oman, MD;  Location: [facility];  Service: Gynecology;  Laterality: N/A;  . BREAST LUMPECTOMY WITH AXILLARY LYMPH NODE BIOPSY Left 12/29/13   left breast   . LAPAROSCOPIC ASSISTED VAGINAL HYSTERECTOMY  09/20/2013   Procedure: LAPAROSCOPIC ASSISTED VAGINAL HYSTERECTOMY;  Surgeon: Osborne Oman, MD;  Location: [facility];  Service: Gynecology;;  PT WAS EXAMINED WHILE UP IN STIRRUPS AND IT WAS DECIDED TO OPEN PT DUE TO LARGE MASS  . LUMBAR LAMINECTOMY/DECOMPRESSION MICRODISCECTOMY Right 12/14/2014   Procedure: Right Lumbar three-four microdiskectomy;  Surgeon: Newman Pies, MD;  Location: [facility];  Service: Neurosurgery;  Laterality: Right;  Right Lumbar three-four microdiskectomy  . RADIOACTIVE SEED GUIDED MASTECTOMY WITH AXILLARY SENTINEL LYMPH NODE BIOPSY Left 12/29/2013   Procedure: LEFT BREAST RADIOACTIVE SEED LOCALIZED LUMPECTOMY WITH SENTINEL LYMPH NODE MAPPING;  Surgeon: Erroll Luna, MD;  Location: [facility];  Service: General;   Laterality: Left;  . RE-EXCISION OF BREAST LUMPECTOMY Left 03/02/2014   Procedure: RE-EXCISION OF LEFT BREAST LUMPECTOMY;  Surgeon: Erroll Luna, MD;  Location: [facility];  Service: General;  Laterality: Left;  . SALPINGOOPHORECTOMY  09/20/2013   Procedure: SALPINGO OOPHORECTOMY;  Surgeon: Osborne Oman, MD;  Location: [facility];  Service: Gynecology;;   Social History   Occupational History  .  Unemployed   Social History Main Topics  . Smoking status: Never Smoker  . Smokeless tobacco: Never Used  . Alcohol use No  . Drug use: No  . Sexual activity: Not Currently    Birth control/ protection: Post-menopausal

## 2016-09-30 NOTE — [place] Notes (Deleted)
Right hip pain for 3 to 4 months. Constant pain. Worse with increased activity such as doing yard work. Feels pulling sensation into the groin at times.

## 2016-09-30 NOTE — Patient Instructions (Signed)

## 2016-10-02 MED ORDER — TRIAMCINOLONE ACETONIDE 40 MG/ML IJ SUSP
80.0000 mg | INTRAMUSCULAR | Status: AC | PRN
  Administered 2016-09-30: 80 mg via INTRA_ARTICULAR

## 2016-10-02 MED ORDER — BUPIVACAINE HCL 0.5 % IJ SOLN
3.0000 mL | INTRAMUSCULAR | Status: AC | PRN
  Administered 2016-09-30: 3 mL via INTRA_ARTICULAR

## 2016-10-04 DIAGNOSIS — E119 Type 2 diabetes mellitus without complications: Secondary | ICD-10-CM | POA: Diagnosis not present

## 2016-10-04 DIAGNOSIS — H43392 Other vitreous opacities, left eye: Secondary | ICD-10-CM | POA: Diagnosis not present

## 2016-10-04 DIAGNOSIS — H33012 Retinal detachment with single break, left eye: Secondary | ICD-10-CM | POA: Diagnosis not present

## 2016-10-04 DIAGNOSIS — H2513 Age-related nuclear cataract, bilateral: Secondary | ICD-10-CM | POA: Diagnosis not present

## 2016-10-04 DIAGNOSIS — H2512 Age-related nuclear cataract, left eye: Secondary | ICD-10-CM | POA: Diagnosis not present

## 2016-10-04 DIAGNOSIS — H40013 Open angle with borderline findings, low risk, bilateral: Secondary | ICD-10-CM | POA: Diagnosis not present

## 2016-10-04 DIAGNOSIS — H33312 Horseshoe tear of retina without detachment, left eye: Secondary | ICD-10-CM | POA: Diagnosis not present

## 2016-10-04 DIAGNOSIS — H3320 Serous retinal detachment, unspecified eye: Secondary | ICD-10-CM | POA: Diagnosis not present

## 2016-10-14 ENCOUNTER — Ambulatory Visit (INDEPENDENT_AMBULATORY_CARE_PROVIDER_SITE_OTHER): Payer: Medicare Other | Admitting: Orthopedic Surgery

## 2016-10-15 DIAGNOSIS — H2512 Age-related nuclear cataract, left eye: Secondary | ICD-10-CM | POA: Diagnosis not present

## 2016-10-15 DIAGNOSIS — H33312 Horseshoe tear of retina without detachment, left eye: Secondary | ICD-10-CM | POA: Diagnosis not present

## 2016-10-15 DIAGNOSIS — H33012 Retinal detachment with single break, left eye: Secondary | ICD-10-CM | POA: Diagnosis not present

## 2016-10-15 DIAGNOSIS — H43392 Other vitreous opacities, left eye: Secondary | ICD-10-CM | POA: Diagnosis not present

## 2016-10-23 DIAGNOSIS — N819 Female genital prolapse, unspecified: Secondary | ICD-10-CM | POA: Diagnosis not present

## 2016-10-23 DIAGNOSIS — N816 Rectocele: Secondary | ICD-10-CM | POA: Diagnosis not present

## 2016-10-23 DIAGNOSIS — N8111 Cystocele, midline: Secondary | ICD-10-CM | POA: Diagnosis not present

## 2016-10-23 DIAGNOSIS — N398 Other specified disorders of urinary system: Secondary | ICD-10-CM | POA: Diagnosis not present

## 2016-10-23 DIAGNOSIS — R140 Abdominal distension (gaseous): Secondary | ICD-10-CM | POA: Insufficient documentation

## 2016-10-25 ENCOUNTER — Other Ambulatory Visit: Payer: Self-pay | Admitting: Obstetrics and Gynecology

## 2016-10-25 DIAGNOSIS — R140 Abdominal distension (gaseous): Secondary | ICD-10-CM

## 2016-10-31 ENCOUNTER — Ambulatory Visit (INDEPENDENT_AMBULATORY_CARE_PROVIDER_SITE_OTHER): Payer: Medicare Other | Admitting: Orthopedic Surgery

## 2016-11-01 ENCOUNTER — Ambulatory Visit: Payer: Medicare Other | Attending: Internal Medicine | Admitting: Internal Medicine

## 2016-11-01 ENCOUNTER — Encounter: Payer: Self-pay | Admitting: Internal Medicine

## 2016-11-01 VITALS — BP 121/78 | HR 86 | Temp 97.9°F | Resp 20 | Wt 203.8 lb

## 2016-11-01 DIAGNOSIS — G8929 Other chronic pain: Secondary | ICD-10-CM | POA: Diagnosis not present

## 2016-11-01 DIAGNOSIS — E785 Hyperlipidemia, unspecified: Secondary | ICD-10-CM | POA: Insufficient documentation

## 2016-11-01 DIAGNOSIS — Z923 Personal history of irradiation: Secondary | ICD-10-CM | POA: Insufficient documentation

## 2016-11-01 DIAGNOSIS — E119 Type 2 diabetes mellitus without complications: Secondary | ICD-10-CM

## 2016-11-01 DIAGNOSIS — M48062 Spinal stenosis, lumbar region with neurogenic claudication: Secondary | ICD-10-CM

## 2016-11-01 DIAGNOSIS — M549 Dorsalgia, unspecified: Secondary | ICD-10-CM | POA: Diagnosis not present

## 2016-11-01 DIAGNOSIS — Z79899 Other long term (current) drug therapy: Secondary | ICD-10-CM | POA: Diagnosis not present

## 2016-11-01 DIAGNOSIS — F329 Major depressive disorder, single episode, unspecified: Secondary | ICD-10-CM | POA: Diagnosis not present

## 2016-11-01 DIAGNOSIS — Z6831 Body mass index (BMI) 31.0-31.9, adult: Secondary | ICD-10-CM | POA: Insufficient documentation

## 2016-11-01 DIAGNOSIS — E669 Obesity, unspecified: Secondary | ICD-10-CM | POA: Diagnosis not present

## 2016-11-01 DIAGNOSIS — Z853 Personal history of malignant neoplasm of breast: Secondary | ICD-10-CM | POA: Insufficient documentation

## 2016-11-01 DIAGNOSIS — Z23 Encounter for immunization: Secondary | ICD-10-CM

## 2016-11-01 DIAGNOSIS — Z90710 Acquired absence of both cervix and uterus: Secondary | ICD-10-CM | POA: Diagnosis not present

## 2016-11-01 DIAGNOSIS — I10 Essential (primary) hypertension: Secondary | ICD-10-CM

## 2016-11-01 DIAGNOSIS — R140 Abdominal distension (gaseous): Secondary | ICD-10-CM | POA: Diagnosis not present

## 2016-11-01 DIAGNOSIS — M1611 Unilateral primary osteoarthritis, right hip: Secondary | ICD-10-CM | POA: Diagnosis not present

## 2016-11-01 DIAGNOSIS — Z7982 Long term (current) use of aspirin: Secondary | ICD-10-CM | POA: Diagnosis not present

## 2016-11-01 DIAGNOSIS — N993 Prolapse of vaginal vault after hysterectomy: Secondary | ICD-10-CM | POA: Diagnosis not present

## 2016-11-01 DIAGNOSIS — Z7984 Long term (current) use of oral hypoglycemic drugs: Secondary | ICD-10-CM | POA: Diagnosis not present

## 2016-11-01 LAB — GLUCOSE, POCT (MANUAL RESULT ENTRY): POC Glucose: 158 mg/dl — AB (ref 70–99)

## 2016-11-01 MED ORDER — TRAMADOL HCL 50 MG PO TABS: 50.0000 mg | ORAL_TABLET | ORAL | 0 refills | Status: DC | PRN

## 2016-11-01 NOTE — [place] Notes (Signed)
Chronic back pain 8/10 today Abdomen/pelvic discomfort

## 2016-11-01 NOTE — Progress Notes (Signed)
Patient ID: [name], female    DOB: [birth year]  MRN: [id number]  CC: Establish Care   Subjective: Rachel Herman is a 66 y.o. female who presents for chronic disease management.  Last saw Dr. Adrian Herman 08/2016. Her concerns today include:  Pt with hx of HTN, DM, HL, DJD lumbar and spinal stenosis with hx of laminectomy 2016, obesity, depression, breast CA LT side treated with XRT (2015 and 2016, followed by Dr. Annamaria Herman).  1. OA RT hip: Saw off low 8/27 and had injection to the right hip. She found this helpful.  2. Vaginal Prolapse  -saw uro-GYNDr. Zigmund Herman at [facility] is for surgical intervention as long as CAT scan of abdomen and pelvis that was ordered by the specialists is negative. Has follow-up appointment on 11/13/2016  3. DM:  -checks BS every day. Range 80-105. Doing ok with eating habits -Feels tired a lot and back bothers her so not very active. However [place] and does a lot of gardening -saw eye doctor 10/03/2016 Rachel Herman. Had laser LT eye 10/14/2016  4. Chronic Back Pain -had laminectomy 2016. Pain better on LT side but worse on RT  -Does well on Tramadol which she takes Q 4 hrs especially if she is out working in garden. Denies increased sedation, constipation or other side effects from the medication. States that they have tried having her take every 6 hours but pain was unmanageable. Requesting refill today -no falls lately. Fell 3 mths ago - slide in mud in her garden -may need additional surgery - her surgeon is Rachel Herman Patient Active Problem List   Diagnosis Date Noted  . Right hip pain 08/29/2016  . 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 Lumbar pain with radiation down right leg 07/02/2016  . Positive H. pylori test 02/27/2016  . Intercostal pain 02/26/2016  . Thyroid nodule 06/09/2015  . Neck pain on right side 05/24/2015  . Lumbar stenosis with neurogenic claudication 12/15/2014  . Obesity (BMI  30-39.9) 09/08/2014  . Nodule of right lung 07/28/2014  . Diverticulosis of colon without hemorrhage 07/28/2014  . Depressed mood 03/09/2014  . DJD (degenerative joint disease), lumbar 02/22/2014  . Lumbar pain with radiation down left leg 01/17/2014  . Breast cancer of upper-inner quadrant of left female breast ([place]) 01/05/2014  . Hyperlipidemia associated with type 2 diabetes mellitus ([place]) 09/28/2013  . S/P total hysterectomy and bilateral salpingo-oophorectomy 09/20/2013  . Morbid obesity with body mass index of 40.0-44.9 in adult ([place]) 09/17/2013  . Essential hypertension   . Diabetes mellitus ([place]) 09/16/2013     Current Outpatient Prescriptions on File Prior to Visit  Medication Sig Dispense Refill  . Ascorbic Acid (VITAMIN C) 1000 MG tablet Take 1,000 mg by mouth daily.    Marland Kitchen aspirin 325 MG tablet Take 325 mg by mouth once.    . Blood Glucose Monitoring Suppl (TRUE METRIX METER) w/Device KIT 1 each by Does not apply route as needed. 1 kit 0  . cloNIDine (CATAPRES) 0.1 MG tablet Take 1 tablet (0.1 mg total) by mouth 2 (two) times daily. 180 tablet 3  . [employer] SENNA PLUS 8.6-50 MG tablet TAKE 2 TABLETS BY MOUTH AT BEDTIME AS NEEDED FOR MILD CONSTIPATION 60 tablet 11  . cyclobenzaprine (FLEXERIL) 10 MG tablet Take 1 tablet (10 mg total) by mouth 3 (three) times daily as needed for muscle spasms. 30 tablet 0  . exemestane (AROMASIN) 25 MG tablet Take 1 tablet (  25 mg total) by mouth daily after breakfast. 90 tablet 3  . gabapentin (NEURONTIN) 300 MG capsule 300 mg during the day, 600 mg at night by mouth 270 capsule 4  . glucose blood test strip Use as instructed 100 each 12  . hydrochlorothiazide (HYDRODIURIL) 25 MG tablet Take 1 tablet (25 mg total) by mouth daily. 90 tablet 3  . Lancets (ACCU-CHEK MULTICLIX) lancets 1 each by Other route 3 (three) times daily. ICD 10 E11.9 100 each 12  . metFORMIN (GLUCOPHAGE) 500 MG tablet Take 1 tablet (500 mg total) by mouth 2 (two) times daily. 180  tablet 3  . Multiple Vitamins-Minerals (MULTIVITAMIN WITH MINERALS) tablet Take 1 tablet by mouth daily. Reported on 03/13/2015    . pravastatin (PRAVACHOL) 20 MG tablet Take 1 tablet (20 mg total) by mouth daily. 90 tablet 3  . ranitidine (ZANTAC) 150 MG tablet Take 1 tablet (150 mg total) by mouth 2 (two) times daily. 180 tablet 3  . traMADol (ULTRAM) 50 MG tablet Take 1 tablet (50 mg total) by mouth every 4 (four) hours as needed for moderate pain. 120 tablet 2  . vitamin D, CHOLECALCIFEROL, 400 UNITS tablet Take 400 Units by mouth daily.     No current facility-administered medications on file prior to visit.     No Known Allergies  Social History   Social History  . Marital status: Married    Spouse name: Rachel Herman   . Number of children: 3  . Years of education: 12   Occupational History  .  Unemployed   Social History Main Topics  . Smoking status: Never Smoker  . Smokeless tobacco: Never Used  . Alcohol use No  . Drug use: No  . Sexual activity: Not Currently    Birth control/ protection: Post-menopausal   Other Topics Concern  . Not on file   Social History Narrative   Married to Rachel Herman in 1986.    Has 3 children from previous marriage, 1 in [place], 1 in [place], 1 in prison ([place]).    Lives with husband.    Right-handed.   1 cup caffeine per day.       Family History  Problem Relation Age of Onset  . Diabetes Mother   . Multiple myeloma Mother 68  . Hearing loss Mother   . Diabetes Brother   . Cancer Brother 40       prostate cancer   . Diabetes Maternal Aunt   . Leukemia Maternal Aunt        dx in her 32s  . Alcoholism Brother   . Heart attack Father   . Diabetes Sister   . Diabetes Son     Past Surgical History:  Procedure Laterality Date  . ABDOMINAL HYSTERECTOMY N/A 09/20/2013   Procedure: HYSTERECTOMY ABDOMINAL;  Surgeon: Osborne Oman, MD;  Location: [facility];  Service: Gynecology;  Laterality: N/A;  . BREAST  LUMPECTOMY WITH AXILLARY LYMPH NODE BIOPSY Left 12/29/13   left breast   . LAPAROSCOPIC ASSISTED VAGINAL HYSTERECTOMY  09/20/2013   Procedure: LAPAROSCOPIC ASSISTED VAGINAL HYSTERECTOMY;  Surgeon: Osborne Oman, MD;  Location: [facility];  Service: Gynecology;;  PT WAS EXAMINED WHILE UP IN [place] AND IT WAS DECIDED TO OPEN PT DUE TO LARGE MASS  . LUMBAR LAMINECTOMY/DECOMPRESSION MICRODISCECTOMY Right 12/14/2014   Procedure: Right Lumbar three-four microdiskectomy;  Surgeon: Newman Pies, MD;  Location: [facility];  Service: Neurosurgery;  Laterality: Right;  Right Lumbar three-four microdiskectomy  . RADIOACTIVE  SEED GUIDED MASTECTOMY WITH AXILLARY SENTINEL LYMPH NODE BIOPSY Left 12/29/2013   Procedure: LEFT BREAST RADIOACTIVE SEED LOCALIZED LUMPECTOMY WITH SENTINEL LYMPH NODE MAPPING;  Surgeon: Erroll Luna, MD;  Location: [facility];  Service: General;  Laterality: Left;  . RE-EXCISION OF BREAST LUMPECTOMY Left 03/02/2014   Procedure: RE-EXCISION OF LEFT BREAST LUMPECTOMY;  Surgeon: Erroll Luna, MD;  Location: [facility];  Service: General;  Laterality: Left;  . SALPINGOOPHORECTOMY  09/20/2013   Procedure: SALPINGO OOPHORECTOMY;  Surgeon: Osborne Oman, MD;  Location: [facility];  Service: Gynecology;;    ROS: Review of Systems Negative except as stated above PHYSICAL EXAM: BP 121/78 (BP Location: Right Arm, Patient Position: Sitting, Cuff Size: Large)   Pulse 86   Temp 97.9 F (36.6 C) (Oral)   Resp 20   Wt 203 lb 12.8 oz (92.4 kg)   SpO2 96%   BMI 31.92 kg/m   Wt Readings from Last 3 Encounters:  11/01/16 203 lb 12.8 oz (92.4 kg)  08/29/16 202 lb 9.6 oz (91.9 kg)  08/21/16 207 lb 1.6 oz (93.9 kg)    Physical Exam  General appearance - alert, well appearing, pleasant elderly Caucasian female and in no distress Mental status - alert, oriented to person, place, and time, normal mood, behavior, speech, dress, motor activity, and thought  processes Mouth - mucous membranes moist, pharynx normal without lesions Neck - supple, no significant adenopathy Chest - clear to auscultation, no wheezes, rales or rhonchi, symmetric air entry Heart - normal rate, regular rhythm, normal S1, S2, no murmurs, rubs, clicks or gallops Extremities - no LE edema  Results for orders placed or performed in visit on 11/01/16  POCT glucose (manual entry)  Result Value Ref Range   POC Glucose 158 (A) 70 - 99 mg/dl   Lab Results  Component Value Date   HGBA1C 6.0 07/02/2016    ASSESSMENT AND PLAN: 1. Type 2 diabetes mellitus without complication, without long-term current use of insulin (HCC) Controlled. Cont Metformin - POCT glucose (manual entry)  2. Need for influenza vaccination - Flu Vaccine QUAD 6+ mos PF IM (Fluarix Quad PF)  3. Essential hypertension At goal  4. Lumbar stenosis with neurogenic claudication  - traMADol (ULTRAM) 50 MG tablet; Take 1 tablet (50 mg total) by mouth every 4 (four) hours as needed.  Dispense: 120 tablet; Refill: 0 Tramadol to fill on or after 11/30/2016 -We will need to have her do a controlled substance prescribing agreement on next visit. [place] reviewed. She did get 3 RFs from her Pulmonologist Dr. Melvyn Novas back in spring but since then has been from Dr. Adrian Herman. Patient was given the opportunity to ask questions.  Patient verbalized understanding of the plan and was able to repeat key elements of the plan.   Orders Placed This Encounter  Procedures  . Flu Vaccine QUAD 6+ mos PF IM (Fluarix Quad PF)  . POCT glucose (manual entry)     Requested Prescriptions   Signed Prescriptions Disp Refills  . traMADol (ULTRAM) 50 MG tablet 120 tablet 0    Sig: Take 1 tablet (50 mg total) by mouth every 4 (four) hours as needed.    Return in about 2 months (around 01/01/2017).  Karle Plumber, MD, FACP

## 2016-11-07 ENCOUNTER — Ambulatory Visit (INDEPENDENT_AMBULATORY_CARE_PROVIDER_SITE_OTHER): Payer: Medicare Other | Admitting: Orthopedic Surgery

## 2016-11-07 ENCOUNTER — Ambulatory Visit
Admission: RE | Admit: 2016-11-07 | Discharge: 2016-11-07 | Disposition: A | Discharge status: Home / Self Care | Payer: Medicare Other | Source: Ambulatory Visit | Attending: Obstetrics and Gynecology | Admitting: Obstetrics and Gynecology

## 2016-11-07 DIAGNOSIS — R140 Abdominal distension (gaseous): Secondary | ICD-10-CM

## 2016-11-07 DIAGNOSIS — K5730 Diverticulosis of large intestine without perforation or abscess without bleeding: Secondary | ICD-10-CM | POA: Diagnosis not present

## 2016-11-07 MED ORDER — IOPAMIDOL (ISOVUE-300) INJECTION 61%
100.0000 mL | Freq: Once | INTRAVENOUS | Status: AC | PRN
  Administered 2016-11-07: 100 mL via INTRAVENOUS

## 2016-11-07 NOTE — Progress Notes (Signed)
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 Ultrasound of the left axilla is negative.         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       12/29/2013 Surgery    Left lumpectomy with close (<0.1cm) inferior margin      03/02/2014 Surgery    Reexcision for close margin, path negative for malignancy.      04/05/2014 - 05/20/2014 Radiation Therapy    adjuvant breast radiation       05/27/2014 Imaging    Bone density scan: normal       06/07/2014 -  Anti-estrogen oral therapy    Exemestane 25 mg daily      06/20/2015 Imaging    Bone scan 06/20/2015 IMPRESSION: No evidence of metastatic disease. Mild increased activity dorsal aspect right midfoot probable degenerative in nature. Clinical correlation is necessary.      02/29/2016 Mammogram    MM DIAG BREAST TOMO BIALTERAL 02/29/16 IMPRESSION: Stable left breast  lumpectomy site. No mammographic evidence of malignancy in the bilateral breasts.      CURRENT THERAPY: Exemestane 38m daily, started on 06/07/2014                                     INTERIM HISTORY:  Rachel Herman here for a follow up of her left breast cancer. She was last seen by me 6 months ago. She presents to the clinic today noting she has a rectal growth and her bladder dropped. She will have a surgery 11/13/16 She was seen by Rachel Herman She still has lower abdominal pain from her GI issues.  She is still taking Exemestane. She notes ongoing hot flashes from the medication. It has improved since 6 months ago, now manageable. Since being back on her cholesterol medication she has been vomiting her food for the past week. She last saw Dr. JWynetta Emerylast Thursday and received her flu vaccination.    MEDICAL HISTORY:  Past Medical History:  Diagnosis Date  . Breast cancer ([place] 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  . Diabetes [place] ([place] 09/16/13   Diagnosed on 09/16/13; HgA1C was 7.2/metformin  . Dizziness   . GERD (gastroesophageal reflux disease)   . Headache   . Hyperlipidemia   .  Hypertension 2014  . Low back pain   . Obesity, morbid, BMI 40.0-49.9 ([place])   . PONV (postoperative nausea and vomiting)   . S/P radiation therapy 04/05/14-05/20/14   left breast 60.4Gy totaldose    SURGICAL HISTORY: Past Surgical History:  Procedure Laterality Date  . ABDOMINAL HYSTERECTOMY N/A 09/20/2013   Procedure: HYSTERECTOMY ABDOMINAL;  Surgeon: Rachel Oman, MD;  Location: [facility];  Service: Gynecology;  Laterality: N/A;  . BREAST LUMPECTOMY WITH AXILLARY LYMPH NODE BIOPSY Left 12/29/13   left breast   . LAPAROSCOPIC ASSISTED VAGINAL HYSTERECTOMY  09/20/2013   Procedure: LAPAROSCOPIC ASSISTED VAGINAL HYSTERECTOMY;  Surgeon: Rachel Oman, MD;  Location: [facility];  Service: Gynecology;;  PT WAS EXAMINED WHILE UP IN [place] AND IT WAS DECIDED TO OPEN PT DUE TO LARGE  MASS  . LUMBAR LAMINECTOMY/DECOMPRESSION MICRODISCECTOMY Right 12/14/2014   Procedure: Right Lumbar three-four microdiskectomy;  Surgeon: Rachel Pies, MD;  Location: [facility];  Service: Neurosurgery;  Laterality: Right;  Right Lumbar three-four microdiskectomy  . RADIOACTIVE SEED GUIDED MASTECTOMY WITH AXILLARY SENTINEL LYMPH NODE BIOPSY Left 12/29/2013   Procedure: LEFT BREAST RADIOACTIVE SEED LOCALIZED LUMPECTOMY WITH SENTINEL LYMPH NODE MAPPING;  Surgeon: Rachel Luna, MD;  Location: [facility];  Service: General;  Laterality: Left;  . RE-EXCISION OF BREAST LUMPECTOMY Left 03/02/2014   Procedure: RE-EXCISION OF LEFT BREAST LUMPECTOMY;  Surgeon: Rachel Luna, MD;  Location: [facility];  Service: General;  Laterality: Left;  . SALPINGOOPHORECTOMY  09/20/2013   Procedure: SALPINGO OOPHORECTOMY;  Surgeon: Rachel Oman, MD;  Location: [facility];  Service: Gynecology;;    SOCIAL HISTORY: Social History   Social History  . Marital status: Married    Spouse name: Rachel Herman   . Number of children: 3  . Years of education: 12   Occupational History  .  Unemployed   Social History Main Topics  . Smoking status: Never Smoker  . Smokeless tobacco: Never Used  . Alcohol use No  . Drug use: No  . Sexual activity: Not Currently    Birth control/ protection: Post-menopausal   Other Topics Concern  . Not on file   Social History Narrative   Married to [employer] in 1986.    Has 3 children from previous marriage, 1 in [place], 1 in [place], 1 in prison ([place]).    Lives with husband.    Right-handed.   1 cup caffeine per day.       FAMILY HISTORY: Family History  Problem Relation Age of Onset  . Diabetes Mother   . Multiple myeloma Mother 33  . Hearing loss Mother   . Diabetes Brother   . Cancer Brother 40       prostate cancer   . Diabetes Maternal Aunt   . Leukemia Maternal Aunt        dx in her 73s  . Alcoholism  Brother   . Heart attack Father   . Diabetes Sister   . Diabetes Son     ALLERGIES:  has No Known Allergies.  MEDICATIONS:  Current Outpatient Prescriptions  Medication Sig Dispense Refill  . Ascorbic Acid (VITAMIN C) 1000 MG tablet Take 1,000 mg by mouth daily.    Marland Kitchen aspirin 325 MG tablet Take 325 mg by mouth once.    . Blood Glucose Monitoring Suppl (TRUE METRIX METER) w/Device KIT 1 each by Does not apply route as needed. 1 kit 0  . cloNIDine (CATAPRES) 0.1 MG tablet Take 1 tablet (0.1  mg total) by mouth 2 (two) times daily. 180 tablet 3  . [employer] SENNA PLUS 8.6-50 MG tablet TAKE 2 TABLETS BY MOUTH AT BEDTIME AS NEEDED FOR MILD CONSTIPATION 60 tablet 11  . cyclobenzaprine (FLEXERIL) 10 MG tablet Take 1 tablet (10 mg total) by mouth 3 (three) times daily as needed for muscle spasms. 30 tablet 0  . exemestane (AROMASIN) 25 MG tablet Take 1 tablet (25 mg total) by mouth daily after breakfast. 90 tablet 3  . gabapentin (NEURONTIN) 300 MG capsule 300 mg during the day, 600 mg at night by mouth 270 capsule 4  . glucose blood test strip Use as instructed 100 each 12  . hydrochlorothiazide (HYDRODIURIL) 25 MG tablet Take 1 tablet (25 mg total) by mouth daily. 90 tablet 3  . Lancets (ACCU-CHEK MULTICLIX) lancets 1 each by Other route 3 (three) times daily. ICD 10 E11.9 100 each 12  . metFORMIN (GLUCOPHAGE) 500 MG tablet Take 1 tablet (500 mg total) by mouth 2 (two) times daily. 180 tablet 3  . Multiple Vitamins-Minerals (MULTIVITAMIN WITH MINERALS) tablet Take 1 tablet by mouth daily. Reported on 03/13/2015    . pravastatin (PRAVACHOL) 20 MG tablet Take 1 tablet (20 mg total) by mouth daily. 90 tablet 3  . ranitidine (ZANTAC) 150 MG tablet Take 1 tablet (150 mg total) by mouth 2 (two) times daily. 180 tablet 3  . traMADol (ULTRAM) 50 MG tablet Take 1 tablet (50 mg total) by mouth every 4 (four) hours as needed for moderate pain. 120 tablet 2  . vitamin D, CHOLECALCIFEROL, 400 UNITS tablet Take 400  Units by mouth daily.    . potassium chloride SA (K-DUR,KLOR-CON) 20 MEQ tablet Take 1 tablet (20 mEq total) by mouth daily. 20 tablet 0   No current facility-administered medications for this visit.     REVIEW OF SYSTEMS:   Constitutional: Denies fevers, chills or abnormal night sweats, (+) hot flashes, tolerable Eyes: No vision difficulties. Ears, nose, mouth, throat, and face: Denies mucositis or sore throat Respiratory: No respiratory difficulty Cardiovascular: Denies palpitation, chest discomfort or lower extremity swelling Gastrointestinal:  Denies nausea, heartburn or change in bowel habits (+) Tenderness in the epigastric area (+) vomiting, nausea from cholesterol medication Skin: Denies abnormal skin rashes Lymphatics: Denies new lymphadenopathy or easy bruising Neurological:Denies numbness, tingling or new weaknesses Behavioral/Psych: Mood is stable, no new changes  All other systems were reviewed with the patient and are negative.  PHYSICAL EXAMINATION:  ECOG PERFORMANCE STATUS: 2  BP 136/89 (BP Location: Right Arm, Patient Position: Sitting)   Pulse 74   Temp 98.4 F (36.9 C) (Oral)   Resp 18   Ht '5\' 7"'  (1.702 Rachel)   Wt 197 lb 11.2 oz (89.7 kg)   SpO2 97%   BMI 30.96 kg/Rachel    GENERAL:alert, no distress and comfortable SKIN: skin color, texture, turgor are normal, no rashes or significant lesions EYES: normal, conjunctiva are pink and non-injected, sclera clear OROPHARYNX:no exudate, no erythema and lips, buccal mucosa, and tongue normal  NECK: supple, thyroid normal size, non-tender, without nodularity LYMPH:  no palpable lymphadenopathy in the cervical, axillary or inguinal LUNGS: clear to auscultation and percussion with normal breathing effort HEART: regular rate & rhythm and no murmurs and no lower extremity edema ABDOMEN:abdomen soft, nontender, no rebound pain normal bowel sounds (+) Tenderness in the epigastric area. Musculoskeletal:no cyanosis of digits and  no clubbing PSYCH: alert & oriented x 3 with fluent speech NEURO: no focal motor/sensory deficits Breasts: Breast  inspection showed them to be symmetrical with no nipple discharge. Surgical scar at the left upper breast and axillary area are well healed. Palpation of the right breasts and axilla revealed no obvious mass that I could appreciate.   I have reviewed the data as listed LABORATORY DATA:  CBC Latest Ref Rng & Units 11/08/2016 05/10/2016 01/11/2016  WBC 3.9 - 10.3 10e3/uL 7.1 5.7 8.0  Hemoglobin 11.6 - 15.9 g/dL 14.5 12.6 13.1  Hematocrit 34.8 - 46.6 % 44.2 38.8 39.5  Platelets 145 - 400 10e3/uL 295 238 280    CMP Latest Ref Rng & Units 11/08/2016 05/10/2016 01/11/2016  Glucose 70 - 140 mg/dl 114 124 109  BUN 7.0 - 26.0 mg/dL 18.9 24.5 24.0  Creatinine 0.6 - 1.1 mg/dL 1.2(H) 1.0 1.0  Sodium 136 - 145 mEq/L 143 145 145  Potassium 3.5 - 5.1 mEq/L 3.0(LL) 3.7 4.3  Chloride 101 - 111 mmol/L - - -  CO2 22 - 29 mEq/L 30(H) 26 28  Calcium 8.4 - 10.4 mg/dL 10.2 9.4 10.0  Total Protein 6.4 - 8.3 g/dL 7.9 6.9 7.5  Total Bilirubin 0.20 - 1.20 mg/dL 0.57 0.48 0.23  Alkaline Phos 40 - 150 U/L 68 63 68  AST 5 - 34 U/L '24 21 21  ' ALT 0 - 55 U/L '23 25 26     ' PATHOLOGY REPORT Diagnosis 12/29/2013 1. Breast, lumpectomy, left - INVASIVE GRADE II DUCTAL CARCINOMA, SPANNING 1.7 CM IN GREATEST DIMENSION. - SECOND FOCUS OF INVASIVE GRADE I DUCTAL CARCINOMA, SPANNING 0.5 CM IN GREATEST DIMENSION. - INTERMEDIATE GRADE DUCTAL CARCINOMA IN SITU ASSOCIATED WITH BOTH FOCI OF TUMOR. - INVASIVE DUCTAL CARCINOMA IS EXTREMELY CLOSE (LESS THAN 0.1 CM) TO INFERIOR MARGIN. - DUCTAL CARCINOMA IN SITU IS CLOSE (0.1 CM) TO INFERIOR MARGIN. - OTHER MARGINS NEGATIVE. - SEE ONCOLOGY TEMPLATE. 2. Lymph node, sentinel, biopsy, Left axillary 1 of 4 FINAL for SHEA, SWALLEY ([id number]) Diagnosis(continued) - ONE BENIGN LYMPH NODE WITH NO TUMOR SEEN (0/1). 3. Lymph node, sentinel, biopsy, Left axilla - ONE  BENIGN LYMPH NODE WITH NO TUMOR SEEN (0/1). Microscopic Comment 1. BREAST, INVASIVE TUMOR, WITH LYMPH NODES PRESENT Specimen, including laterality and lymph node sampling (sentinel, non-sentinel): Left partial breast with left sentinel lymph node sampling. Procedure: Left breast lumpectomy with sentinel lymph node biopsies. Histologic type: Invasive ductal carcinoma. Larger focus: Grade: II. Tubule formation: 2. Nuclear pleomorphism: 2. Mitotic: 3. Smaller focus (near inferior margin): Grade: I. Tubule formation: 1. Nuclear pleomorphism: 2. Mitotic: 1. Tumor sizes (gross and glass slide measurement): 1.7 cm and 0.5 cm. Margins: Invasive, distance to closest margin: less than 0.1 cm (inferior margin). In-situ, distance to closest margin: 0.1 cm (inferior margin). Lymphovascular invasion: Definitive lymphovascular invasion is not identified. Ductal carcinoma in situ: Yes. Grade: Intermediate grade. Extensive intraductal component: There is an extensive intraductal component of ductal carcinoma in situ on the smaller focus but not on the larger focus. Lobular neoplasia: No. Tumor focality: Two foci, multifocal. Treatment effect: N/A. Extent of tumor: Tumor confined to breast parenchyma. Skin: Not received. Nipple: Not received. Skeletal muscle: Not received. Lymph nodes: Examined: 2 Sentinel. 0 Non-sentinel. 2 Total. Lymph nodes with metastasis: 0. Breast prognostic profile: Performed on previous case ([id number]). Estrogen receptor: 100%, positive. Progesterone receptor: 96%, positive. Her 2 neu by CISH: 0.81 ratio, no amplification. Ki-67: 73%. Non-neoplastic breast: Fat necrosis. TNM: pT1c(Rachel), pN0, MX. Comments: A smooth muscle myosin, calponin, and p63 immunohistochemical stain are performed on a single block, which helps to confirm the presence  of a second focus of invasive ductal carcinoma associated 2 of 4 FINAL for HALAINA, VANDUZER ([id number]) Microscopic  Comment(continued) with ductal carcinoma in situ. As Her-2 neu by CISH was negative on the initial biopsy, this will be repeated on the larger tumor focus and reported in an addendum to follow. A breast prognostic profile will not be performed on the smaller tumor focus unless otherwise requested (the tumor foci although different grades show morphologically similar nuclear features). (RH:ds 12/31/13)  Oncotype DX Score: 23 (Intermediate risk) (ER+/PR+/HER2-)   Diagnosis 05/09/2015 THYROID, FINE NEEDLE ASPIRATION, RLP (SPECIMEN 1 OF 2 COLLECTED 05-09-2015) CONSISTENT WITH BENIGN FOLLICULAR NODULE (BETHESDA CATEGORY II).  Diagnosis 05/09/2015 THYROID, FINE NEEDLE ASPIRATION, LMP (SPECIMEN 2 OF 2 COLLECTED 05-09-2015) CONSISTENT WITH BENIGN FOLLICULAR NODULE (BETHESDA CATEGORY II).   RADIOGRAPHIC STUDIES: I have personally reviewed the radiological images as listed and agreed with the findings in the report.  CT AP W contrast 11/07/16 IMPRESSION: Stable nodular densities in the right middle and right lower lobes dating back to 2016. Given their stability these are felt to be benign in etiology. Diverticulosis without diverticulitis. Dependent density within the gallbladder which may represent a small polyp or poorly calcified stone.   Diagnostic mammogram 02/29/16 IMPRESSION: Stable left breast lumpectomy site. No mammographic evidence of malignancy in the bilateral breasts. RECOMMENDATION: Diagnostic mammogram is suggested in 1 year. (Code:[id number])  Bone scan 06/20/2015 IMPRESSION: No evidence of metastatic disease. Mild increased activity dorsal aspect right midfoot probable degenerative in nature. Clinical correlation is necessary.   US SOFT TISSUE HEAD AND NECK 04/03/2015 IMPRESSION: Multi bilateral nodules. Largest on the right measures 19 mm. Largest on left measures 21 mm. Findings meet consensus criteria for biopsy. Ultrasound-guided fine needle aspiration should  be considered, as per the consensus statement: Management of Thyroid Nodules Detected at [place]: Society of Radiologists in [place]. Radiology 2005; [id number].  Mammogram 02/14/2015 IMPRESSION: No findings worrisome for recurrent tumor or developing malignancy. RECOMMENDATION: Bilateral diagnostic mammography in 1 year.   ASSESSMENT & PLAN:  66 y.o. Caucasian female, with past medical history of hypertension, diabetes, dyslipidemia, obesity who was found to have a left breast mass by screening mammogram.  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 Grade 2, Ki-67 73%. -She is status post complete surgical resection with lumpectomy. -Her breast cancer is likely cured by surgery alone, but does has some risk of disease recurrence in the future. -I discussed the Oncotype DX result with her and her husband. The recurrence score is 23, with the predicted 10 year recurrence risk of 15% with tamoxifen alone. The benefit of chemotherapy is uncertain in this intermediate risk group. I do not strongly recommend adjuvant chemotherapy. Patient agree with not having chemotherapy.  -Due to the strong positivity of ER and PR, I recommended adjuvant  Aromasin for total 5 years -She has been tolerating aromasin well, will continue for a total of 5 years  - due to the new onset bilateral rib pain and midthoracic pain in 06/2015, I ordered a bone scan which was negative for bone metastasis. -She is tolerating Exemestane well. Will continue  -she is clinically doing well, lab and exam unremarkable today except her potassium is 3.0. Her last mammogram on 02/29/2016 was normal. No evidence of recurrence. She will continue surveillance. -I ordered potassium for her to take once daily for 20 days then change to potassium rich food -Her next mammogram  is 02/2017 -f/u in 6 months   #2 Bone  health -Her recent bone density scan was normal in 05/2014 -We previously discussed Aromasin may weak her bone, she will continue calcium and vitamin D. -will get bone density after surgery in 02/2017  #3. hypertension, diabetes, dyslipidemia, chronic low back pain -She will continue follow-up with her primary care physician.  #4 Morbid obesity -We previously discussed healthy diet and regular exercise. She is willing to try after she recovers well from her back surgery  #5 right lung nodule -She had a CT abdomen and pelvis in June 2016, which incidentally found a 4 mm nodule in the right lower lobe. I personally reviewed the image.  -She never smoked, risk of lung cancer is low  -her repeated CT chest from 12/09/2014, which showed stable small lung nodules, likely benign. -she had CT chest on 08/18/2015 for chest pain, which showed stable 4m right lung base subpleural nodule, likely benign, no need to follow-up since she is a never smoker   #6. Thyroid  Benign follicular nodule -Her CT scan revealed a 1.7 cm right thyroid nodule. - ultrasound guided thyroid nodule biopsy on 05/09/2015 showed benign follicular nodule   #7 epigastric pain/GI issues -Uncertain etiology. She has been following up with her primary care physician -unlikely related to exemestane -She had CT scan by Dr. MRodena Pietyfor her Dropped bladder and Rectal growth.  -She will undergo surgery for both on 11/13/16.   PLAN: -Labs reviewed, Potassium at 3.0, Ordered potassium to take once daily then change to potassium rich food.  -Lab and f/u in 6 months -[facility] -Continue Exemestane    All questions were answered. The patient knows to call the clinic with any problems, questions or concerns.  I spent 20 minutes counseling the patient face to face. The total time spent in the appointment was 25 minutes and more than 50% was on counseling.     FTruitt Merle MD 11/08/2016    This document serves  as a record of services personally performed by YTruitt Merle MD. It was created on her behalf by AJoslyn Devon a trained medical scribe. The creation of this record is based on the scribe's personal observations and the provider's statements to them. This document has been checked and approved by the attending provider.

## 2016-11-08 ENCOUNTER — Other Ambulatory Visit (HOSPITAL_BASED_OUTPATIENT_CLINIC_OR_DEPARTMENT_OTHER): Payer: Medicare Other

## 2016-11-08 ENCOUNTER — Ambulatory Visit (HOSPITAL_BASED_OUTPATIENT_CLINIC_OR_DEPARTMENT_OTHER): Payer: Medicare Other | Admitting: Hematology

## 2016-11-08 ENCOUNTER — Telehealth: Payer: Self-pay | Admitting: Hematology

## 2016-11-08 ENCOUNTER — Encounter: Payer: Self-pay | Admitting: Hematology

## 2016-11-08 VITALS — BP 136/89 | HR 74 | Temp 98.4°F | Resp 18 | Ht 67.0 in | Wt 197.7 lb

## 2016-11-08 DIAGNOSIS — C50212 Malignant neoplasm of upper-inner quadrant of left female breast: Secondary | ICD-10-CM

## 2016-11-08 DIAGNOSIS — E2839 Other primary ovarian failure: Secondary | ICD-10-CM

## 2016-11-08 DIAGNOSIS — Z79811 Long term (current) use of aromatase inhibitors: Secondary | ICD-10-CM | POA: Diagnosis not present

## 2016-11-08 DIAGNOSIS — R1013 Epigastric pain: Secondary | ICD-10-CM

## 2016-11-08 DIAGNOSIS — Z170 Estrogen receptor positive status [ER+]: Secondary | ICD-10-CM

## 2016-11-08 LAB — COMPREHENSIVE METABOLIC PANEL
ALT: 23 U/L (ref 0–55)
AST: 24 U/L (ref 5–34)
Albumin: 3.9 g/dL (ref 3.5–5.0)
Alkaline Phosphatase: 68 U/L (ref 40–150)
Anion Gap: 11 mEq/L (ref 3–11)
BUN: 18.9 mg/dL (ref 7.0–26.0)
CO2: 30 mEq/L — ABNORMAL HIGH (ref 22–29)
Calcium: 10.2 mg/dL (ref 8.4–10.4)
Chloride: 101 mEq/L (ref 98–109)
Creatinine: 1.2 mg/dL — ABNORMAL HIGH (ref 0.6–1.1)
EGFR: 49 mL/min/{1.73_m2} — ABNORMAL LOW (ref >90)
Glucose: 114 mg/dl (ref 70–140)
Potassium: 3 mEq/L — CL (ref 3.5–5.1)
Sodium: 143 mEq/L (ref 136–145)
Total Bilirubin: 0.57 mg/dL (ref 0.20–1.20)
Total Protein: 7.9 g/dL (ref 6.4–8.3)

## 2016-11-08 LAB — CBC WITH DIFFERENTIAL/PLATELET
BASO%: 0.5 % (ref 0.0–2.0)
Basophils Absolute: 0 10*3/uL (ref 0.0–0.1)
EOS%: 1.8 % (ref 0.0–7.0)
Eosinophils Absolute: 0.1 10*3/uL (ref 0.0–0.5)
HCT: 44.2 % (ref 34.8–46.6)
HGB: 14.5 g/dL (ref 11.6–15.9)
LYMPH%: 23.9 % (ref 14.0–49.7)
MCH: 27.9 pg (ref 25.1–34.0)
MCHC: 32.7 g/dL (ref 31.5–36.0)
MCV: 85.3 fL (ref 79.5–101.0)
MONO#: 0.7 10*3/uL (ref 0.1–0.9)
MONO%: 9.8 % (ref 0.0–14.0)
NEUT#: 4.6 10*3/uL (ref 1.5–6.5)
NEUT%: 64 % (ref 38.4–76.8)
Platelets: 295 10*3/uL (ref 145–400)
RBC: 5.18 10*6/uL (ref 3.70–5.45)
RDW: 15.1 % — ABNORMAL HIGH (ref 11.2–14.5)
WBC: 7.1 10*3/uL (ref 3.9–10.3)
lymph#: 1.7 10*3/uL (ref 0.9–3.3)

## 2016-11-08 MED ORDER — POTASSIUM CHLORIDE CRYS ER 20 MEQ PO TBCR: 20.0000 meq | EXTENDED_RELEASE_TABLET | Freq: Every day | ORAL | 0 refills | Status: DC

## 2016-11-08 NOTE — Telephone Encounter (Signed)
Gave patient AVS and calendar of upcoming appointments. DEXA and MM are scheduled for January 2019.

## 2016-11-13 DIAGNOSIS — N8111 Cystocele, midline: Secondary | ICD-10-CM | POA: Diagnosis not present

## 2016-11-13 DIAGNOSIS — N816 Rectocele: Secondary | ICD-10-CM | POA: Diagnosis not present

## 2016-11-13 DIAGNOSIS — N819 Female genital prolapse, unspecified: Secondary | ICD-10-CM | POA: Diagnosis not present

## 2016-11-25 DIAGNOSIS — R399 Unspecified symptoms and signs involving the genitourinary system: Secondary | ICD-10-CM | POA: Diagnosis not present

## 2017-01-01 DIAGNOSIS — N8110 Cystocele, unspecified: Secondary | ICD-10-CM | POA: Diagnosis not present

## 2017-01-02 ENCOUNTER — Telehealth: Payer: Self-pay | Admitting: Pharmacy Technician

## 2017-01-02 ENCOUNTER — Encounter: Payer: Self-pay | Admitting: Internal Medicine

## 2017-01-02 ENCOUNTER — Ambulatory Visit: Payer: Medicare Other | Attending: Internal Medicine | Admitting: Internal Medicine

## 2017-01-02 VITALS — BP 116/79 | HR 74 | Temp 97.9°F | Resp 18 | Ht 67.0 in | Wt 204.0 lb

## 2017-01-02 DIAGNOSIS — Z833 Family history of diabetes mellitus: Secondary | ICD-10-CM | POA: Diagnosis not present

## 2017-01-02 DIAGNOSIS — Z9889 Other specified postprocedural states: Secondary | ICD-10-CM | POA: Insufficient documentation

## 2017-01-02 DIAGNOSIS — Z79899 Other long term (current) drug therapy: Secondary | ICD-10-CM | POA: Insufficient documentation

## 2017-01-02 DIAGNOSIS — Z7982 Long term (current) use of aspirin: Secondary | ICD-10-CM | POA: Diagnosis not present

## 2017-01-02 DIAGNOSIS — G8929 Other chronic pain: Secondary | ICD-10-CM

## 2017-01-02 DIAGNOSIS — N993 Prolapse of vaginal vault after hysterectomy: Secondary | ICD-10-CM | POA: Insufficient documentation

## 2017-01-02 DIAGNOSIS — Z7984 Long term (current) use of oral hypoglycemic drugs: Secondary | ICD-10-CM | POA: Insufficient documentation

## 2017-01-02 DIAGNOSIS — Z923 Personal history of irradiation: Secondary | ICD-10-CM | POA: Diagnosis not present

## 2017-01-02 DIAGNOSIS — N8110 Cystocele, unspecified: Secondary | ICD-10-CM

## 2017-01-02 DIAGNOSIS — M5416 Radiculopathy, lumbar region: Secondary | ICD-10-CM | POA: Diagnosis not present

## 2017-01-02 DIAGNOSIS — E119 Type 2 diabetes mellitus without complications: Secondary | ICD-10-CM | POA: Insufficient documentation

## 2017-01-02 DIAGNOSIS — I10 Essential (primary) hypertension: Secondary | ICD-10-CM | POA: Diagnosis not present

## 2017-01-02 DIAGNOSIS — Z853 Personal history of malignant neoplasm of breast: Secondary | ICD-10-CM | POA: Insufficient documentation

## 2017-01-02 DIAGNOSIS — M5410 Radiculopathy, site unspecified: Secondary | ICD-10-CM | POA: Diagnosis not present

## 2017-01-02 DIAGNOSIS — F329 Major depressive disorder, single episode, unspecified: Secondary | ICD-10-CM | POA: Insufficient documentation

## 2017-01-02 DIAGNOSIS — Z2821 Immunization not carried out because of patient refusal: Secondary | ICD-10-CM | POA: Diagnosis not present

## 2017-01-02 DIAGNOSIS — Z90710 Acquired absence of both cervix and uterus: Secondary | ICD-10-CM | POA: Insufficient documentation

## 2017-01-02 DIAGNOSIS — Z8042 Family history of malignant neoplasm of prostate: Secondary | ICD-10-CM | POA: Insufficient documentation

## 2017-01-02 LAB — GLUCOSE, POCT (MANUAL RESULT ENTRY): POC Glucose: 110 mg/dl — AB (ref 70–99)

## 2017-01-02 LAB — POCT GLYCOSYLATED HEMOGLOBIN (HGB A1C): Hemoglobin A1C: 6

## 2017-01-02 MED ORDER — TRAMADOL HCL 50 MG PO TABS: 50.0000 mg | ORAL_TABLET | ORAL | 2 refills | Status: DC | PRN

## 2017-01-02 NOTE — Telephone Encounter (Signed)
Oral Oncology Patient Advocate Encounter  Re-enrollment application and paperwork was dropped off for [name] patient assistance program.  This paperwork was completed and submitted in an effort to keep the patient's out of pocket expense for Aromasin at $0.    Application completed and faxed to [phone].   [employer] patient assistance phone number for follow up is [phone].   This encounter will be updated until final determination.   Rachel Herman. Melynda Keller, [place] [name] [phone] 01/02/2017 1:14 PM

## 2017-01-02 NOTE — Progress Notes (Signed)
Patient ID: Rachel Herman, female    DOB: 17-Sep-1950  MRN: [id number]  CC: Hypertension   Subjective: Rachel Herman is a 66 y.o. female who presents for chronic ds management. Last seen 2 mths ago. Her concerns today include:  Pt with hx of HTN, DM, HL, DJD lumbar and spinal stenosis with hx of laminectomy 2016, obesity, depression, breast CA LT side treated with XRT (2015 and 2016, followed by Dr. Annamaria Boots), vaginal prolapse.  1. Vaginal Prolapse  -saw uro-GYN Dr. Zigmund Daniel at [facility] is for surgical intervention 01/20/2017; pre-op eval on 01/07/2017.   2.  Chronic Back Pain -had laminectomy 2016. Pain better on LT side but worse on RT  -Does well on Tramadol and Gabapentin.  Denies increased sedation, constipation or other side effects from the medication. Requesting refill today -saw a pain specialist about 1 yr ago -no falls lately.    3. DM: checks BS QOD. Range 85-115 -doing better with eating habits. -Exercise: "I try to walk every day. 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 Wgh today here increase because she is bundled up and was wearing jacket at wgh in  4. HTN: compliant with meds and salt restriction -no CP/SOB at rest or exerction K+ low on CMP done by onc 11/08/2016.  She was put on potassium pills for 20 days.  She has completed.  Patient Active Problem List   Diagnosis Date Noted  . Right hip pain 08/29/2016  . 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 Lumbar pain with radiation down right leg 07/02/2016  . Positive H. pylori test 02/27/2016  . Intercostal pain 02/26/2016  . Thyroid nodule 06/09/2015  . Neck pain on right side 05/24/2015  . Lumbar stenosis with neurogenic claudication 12/15/2014  . Obesity (BMI 30-39.9) 09/08/2014  . Nodule of right lung 07/28/2014  . Diverticulosis of colon without hemorrhage 07/28/2014  . Depressed mood 03/09/2014    . DJD (degenerative joint disease), lumbar 02/22/2014  . Lumbar pain with radiation down left leg 01/17/2014  . Breast cancer of upper-inner quadrant of left female breast ([place]) 01/05/2014  . Hyperlipidemia associated with type 2 diabetes mellitus ([place]) 09/28/2013  . S/P total hysterectomy and bilateral salpingo-oophorectomy 09/20/2013  . Morbid obesity with body mass index of 40.0-44.9 in adult ([place]) 09/17/2013  . Essential hypertension   . Diabetes mellitus ([place]) 09/16/2013     Current Outpatient Medications on File Prior to Visit  Medication Sig Dispense Refill  . Ascorbic Acid (VITAMIN C) 1000 MG tablet Take 1,000 mg by mouth daily.    Marland Kitchen aspirin 325 MG tablet Take 325 mg by mouth once.    . Blood Glucose Monitoring Suppl (TRUE METRIX METER) w/Device KIT 1 each by Does not apply route as needed. 1 kit 0  . cloNIDine (CATAPRES) 0.1 MG tablet Take 1 tablet (0.1 mg total) by mouth 2 (two) times daily. 180 tablet 3  . [facility] SENNA PLUS 8.6-50 MG tablet TAKE 2 TABLETS BY MOUTH AT BEDTIME AS NEEDED FOR MILD CONSTIPATION 60 tablet 11  . cyclobenzaprine (FLEXERIL) 10 MG tablet Take 1 tablet (10 mg total) by mouth 3 (three) times daily as needed for muscle spasms. 30 tablet 0  . exemestane (AROMASIN) 25 MG tablet Take 1 tablet (25 mg total) by mouth daily after breakfast. 90 tablet 3  . gabapentin (NEURONTIN) 300 MG capsule 300 mg during the  day, 600 mg at night by mouth 270 capsule 4  . glucose blood test strip Use as instructed 100 each 12  . hydrochlorothiazide (HYDRODIURIL) 25 MG tablet Take 1 tablet (25 mg total) by mouth daily. 90 tablet 3  . Lancets (ACCU-CHEK MULTICLIX) lancets 1 each by Other route 3 (three) times daily. ICD 10 E11.9 100 each 12  . metFORMIN (GLUCOPHAGE) 500 MG tablet Take 1 tablet (500 mg total) by mouth 2 (two) times daily. 180 tablet 3  . Multiple Vitamins-Minerals (MULTIVITAMIN WITH MINERALS) tablet Take 1 tablet by mouth daily. Reported on 03/13/2015    . potassium  chloride SA (K-DUR,KLOR-CON) 20 MEQ tablet Take 1 tablet (20 mEq total) by mouth daily. 20 tablet 0  . pravastatin (PRAVACHOL) 20 MG tablet Take 1 tablet (20 mg total) by mouth daily. 90 tablet 3  . ranitidine (ZANTAC) 150 MG tablet Take 1 tablet (150 mg total) by mouth 2 (two) times daily. 180 tablet 3  . traMADol (ULTRAM) 50 MG tablet Take 1 tablet (50 mg total) by mouth every 4 (four) hours as needed for moderate pain. 120 tablet 2  . vitamin D, CHOLECALCIFEROL, 400 UNITS tablet Take 400 Units by mouth daily.     No current facility-administered medications on file prior to visit.     No Known Allergies  Social History   Socioeconomic History  . Marital status: Married    Spouse name: Johnanna Bakke   . Number of children: 3  . Years of education: 50  . Highest education level: Not on file  Social Needs  . Financial resource strain: Not on file  . Food insecurity - worry: Not on file  . Food insecurity - inability: Not on file  . Transportation needs - medical: Not on file  . Transportation needs - non-medical: Not on file  Occupational History    Employer: UNEMPLOYED  Tobacco Use  . Smoking status: Never Smoker  . Smokeless tobacco: Never Used  Substance and Sexual Activity  . Alcohol use: No  . Drug use: No  . Sexual activity: Not Currently    Birth control/protection: Post-menopausal  Other Topics Concern  . Not on file  Social History Narrative   Married to [employer] in 1986.    Has 3 children from previous marriage, 1 in [place], 1 in [place], 1 in prison ([place]).    Lives with husband.    Right-handed.   1 cup caffeine per day.    Family History  Problem Relation Age of Onset  . Diabetes Mother   . Multiple myeloma Mother 46  . Hearing loss Mother   . Diabetes Brother   . Cancer Brother 40       prostate cancer   . Diabetes Maternal Aunt   . Leukemia Maternal Aunt        dx in her 18s  . Alcoholism Brother   . Heart attack Father   .  Diabetes Sister   . Diabetes Son     Past Surgical History:  Procedure Laterality Date  . ABDOMINAL HYSTERECTOMY N/A 09/20/2013   Procedure: HYSTERECTOMY ABDOMINAL;  Surgeon: Osborne Oman, MD;  Location: [facility];  Service: Gynecology;  Laterality: N/A;  . BREAST LUMPECTOMY WITH AXILLARY LYMPH NODE BIOPSY Left 12/29/13   left breast   . LAPAROSCOPIC ASSISTED VAGINAL HYSTERECTOMY  09/20/2013   Procedure: LAPAROSCOPIC ASSISTED VAGINAL HYSTERECTOMY;  Surgeon: Osborne Oman, MD;  Location: [facility];  Service: Gynecology;;  PT WAS EXAMINED WHILE UP IN  STIRRUPS AND IT WAS DECIDED TO OPEN PT DUE TO LARGE MASS  . LUMBAR LAMINECTOMY/DECOMPRESSION MICRODISCECTOMY Right 12/14/2014   Procedure: Right Lumbar three-four microdiskectomy;  Surgeon: Newman Pies, MD;  Location: [facility];  Service: Neurosurgery;  Laterality: Right;  Right Lumbar three-four microdiskectomy  . RADIOACTIVE SEED GUIDED PARTIAL MASTECTOMY WITH AXILLARY SENTINEL LYMPH NODE BIOPSY Left 12/29/2013   Procedure: LEFT BREAST RADIOACTIVE SEED LOCALIZED LUMPECTOMY WITH SENTINEL LYMPH NODE MAPPING;  Surgeon: Erroll Luna, MD;  Location: [facility];  Service: General;  Laterality: Left;  . RE-EXCISION OF BREAST LUMPECTOMY Left 03/02/2014   Procedure: RE-EXCISION OF LEFT BREAST LUMPECTOMY;  Surgeon: Erroll Luna, MD;  Location: [facility];  Service: General;  Laterality: Left;  . SALPINGOOPHORECTOMY  09/20/2013   Procedure: SALPINGO OOPHORECTOMY;  Surgeon: Osborne Oman, MD;  Location: [facility];  Service: Gynecology;;    ROS: Review of Systems Neg except as above  PHYSICAL EXAM: BP 116/79 (BP Location: Left Arm, Patient Position: Sitting, Cuff Size: Normal)   Pulse 74   Temp 97.9 F (36.6 C) (Oral)   Resp 18   Ht '5\' 7"'  (1.702 m)   Wt 204 lb (92.5 kg)   SpO2 99%   BMI 31.95 kg/m   Wt Readings from Last 3 Encounters:  01/02/17 204 lb (92.5 kg)  11/08/16 197 lb 11.2 oz (89.7 kg)  11/01/16  203 lb 12.8 oz (92.4 kg)    Physical Exam General appearance - alert, well appearing, pleasant elderly Caucasian female and in no distress Mental status - alert, oriented normal mood, behavior, speech, dress, motor activity, and thought processes Mouth - mucous membranes moist, pharynx normal without lesions Neck - supple, no significant adenopathy Chest - clear to auscultation, no wheezes, rales or rhonchi, symmetric air entry Heart - normal rate, regular rhythm, normal S1, S2, no murmurs, rubs, clicks or gallops Extremities - no LE edema MSK: Ambulates without assistive device.  Transfers without assistance.  Mild tenderness on palpation of LS spine.    Chemistry      Component Value Date/Time   NA 143 11/08/2016 0915   K 3.0 (LL) 11/08/2016 0915   CL 102 08/17/2015 2252   CO2 30 (H) 11/08/2016 0915   BUN 18.9 11/08/2016 0915   CREATININE 1.2 (H) 11/08/2016 0915      Component Value Date/Time   CALCIUM 10.2 11/08/2016 0915   ALKPHOS 68 11/08/2016 0915   AST 24 11/08/2016 0915   ALT 23 11/08/2016 0915   BILITOT 0.57 11/08/2016 0915     Results for orders placed or performed in visit on 01/02/17  HgB A1c  Result Value Ref Range   Hemoglobin A1C 6.0   Glucose (CBG)  Result Value Ref Range   POC Glucose 110 (A) 70 - 99 mg/dl    ASSESSMENT AND PLAN: 1. Type 2 diabetes mellitus without complication, without long-term current use of insulin (HCC) Controlled based on reported blood sugar readings. -Encourage her to continue walking and eating healthy Continue metformin - HgB A1c - Glucose (CBG)  2. Essential hypertension -At goal.  Continue clonidine, HCTZ.  On follow-up visit we will try to change clonidine to ACE-I -Recheck potassium today  3. Chronic radicular pain of lower back -Refill given on tramadol.  Reminded pt that it is a controlled substance and can develop dependence and/or addiction with continued chronic use -Controlled substance prescribing agreement  updated today. NCCSRS appropriate - [id number] 11+Oxyco+Alc+Crt-Bund - traMADol (ULTRAM) 50 MG tablet; Take 1 tablet (50  mg total) by mouth every 4 (four) hours as needed for moderate pain.  Dispense: 120 tablet; Refill: 2  4. Vaginal prolapse -Optimized for surgery next month  5. Pneumococcal vaccination declined   Patient was given the opportunity to ask questions.  Patient verbalized understanding of the plan and was able to repeat key elements of the plan.   Orders Placed This Encounter  Procedures  . HgB A1c  . Glucose (CBG)     Requested Prescriptions    No prescriptions requested or ordered in this encounter    Follow-up in 3 months. Karle Plumber, MD, FACP

## 2017-01-03 LAB — POTASSIUM: Potassium: 3.7 mmol/L (ref 3.5–5.2)

## 2017-01-03 NOTE — Telephone Encounter (Signed)
-----   Message from Ladell Pier, MD sent at 01/03/2017  1:49 PM EST ----- Let patient know that her potassium level was normal.

## 2017-01-03 NOTE — Telephone Encounter (Signed)
Patient inform on potassium level.  Patient verified DOB.

## 2017-01-06 LAB — DRUG SCREEN 764883 11+OXYCO+ALC+CRT-BUND
Amphetamines, Urine: NEGATIVE ng/mL
BENZODIAZ UR QL: NEGATIVE ng/mL
Barbiturate: NEGATIVE ng/mL
Cannabinoid Quant, Ur: NEGATIVE ng/mL
Cocaine (Metabolite): NEGATIVE ng/mL
Creatinine: 173.5 mg/dL (ref 20.0–300.0)
Ethanol: NEGATIVE %
Meperidine: NEGATIVE ng/mL
Methadone Screen, Urine: NEGATIVE ng/mL
OPIATE SCREEN URINE: NEGATIVE ng/mL
Oxycodone/Oxymorphone, Urine: NEGATIVE ng/mL
Phencyclidine: NEGATIVE ng/mL
Propoxyphene: NEGATIVE ng/mL
pH, Urine: 5.7 (ref 4.5–8.9)

## 2017-01-06 LAB — TRAMADOL GC/MS, URINE
Tramadol gc/ms Conf: >3000 ng/mL
Tramadol: POSITIVE — AB

## 2017-01-07 ENCOUNTER — Encounter: Payer: Self-pay | Admitting: *Deleted

## 2017-01-20 DIAGNOSIS — N816 Rectocele: Secondary | ICD-10-CM | POA: Diagnosis not present

## 2017-01-20 DIAGNOSIS — K219 Gastro-esophageal reflux disease without esophagitis: Secondary | ICD-10-CM | POA: Diagnosis not present

## 2017-01-20 DIAGNOSIS — N8112 Cystocele, lateral: Secondary | ICD-10-CM | POA: Diagnosis not present

## 2017-01-20 DIAGNOSIS — Z794 Long term (current) use of insulin: Secondary | ICD-10-CM | POA: Diagnosis not present

## 2017-01-20 DIAGNOSIS — E785 Hyperlipidemia, unspecified: Secondary | ICD-10-CM | POA: Diagnosis not present

## 2017-01-20 DIAGNOSIS — I10 Essential (primary) hypertension: Secondary | ICD-10-CM | POA: Diagnosis not present

## 2017-01-20 DIAGNOSIS — Z853 Personal history of malignant neoplasm of breast: Secondary | ICD-10-CM | POA: Diagnosis not present

## 2017-01-20 DIAGNOSIS — N8111 Cystocele, midline: Secondary | ICD-10-CM | POA: Diagnosis not present

## 2017-01-20 DIAGNOSIS — J309 Allergic rhinitis, unspecified: Secondary | ICD-10-CM | POA: Diagnosis not present

## 2017-01-20 DIAGNOSIS — E119 Type 2 diabetes mellitus without complications: Secondary | ICD-10-CM | POA: Diagnosis not present

## 2017-01-21 DIAGNOSIS — N816 Rectocele: Secondary | ICD-10-CM | POA: Diagnosis not present

## 2017-01-21 DIAGNOSIS — K219 Gastro-esophageal reflux disease without esophagitis: Secondary | ICD-10-CM | POA: Diagnosis not present

## 2017-01-21 DIAGNOSIS — N8111 Cystocele, midline: Secondary | ICD-10-CM | POA: Diagnosis not present

## 2017-01-21 DIAGNOSIS — Z853 Personal history of malignant neoplasm of breast: Secondary | ICD-10-CM | POA: Diagnosis not present

## 2017-01-21 DIAGNOSIS — E119 Type 2 diabetes mellitus without complications: Secondary | ICD-10-CM | POA: Diagnosis not present

## 2017-01-21 DIAGNOSIS — I10 Essential (primary) hypertension: Secondary | ICD-10-CM | POA: Diagnosis not present

## 2017-01-21 DIAGNOSIS — Z794 Long term (current) use of insulin: Secondary | ICD-10-CM | POA: Diagnosis not present

## 2017-01-21 DIAGNOSIS — E785 Hyperlipidemia, unspecified: Secondary | ICD-10-CM | POA: Diagnosis not present

## 2017-01-21 DIAGNOSIS — J309 Allergic rhinitis, unspecified: Secondary | ICD-10-CM | POA: Diagnosis not present

## 2017-02-24 ENCOUNTER — Telehealth: Payer: Self-pay | Admitting: Pharmacy Technician

## 2017-02-24 NOTE — Telephone Encounter (Signed)
Oral Oncology Patient Advocate Encounter  I was informed by [place] that grant funding was available for this patient's Aromasin.  A $2800 copay assistance grant was obtained for the patient.    The application with Pf[employer] has been placed on hold and filed in the event of need later in the year.    Fabio Asa. Melynda Keller, [place] Patient [place] [phone] 02/24/2017 2:43 PM

## 2017-02-24 NOTE — Telephone Encounter (Signed)
Oral Oncology Patient Advocate Encounter  Was successful in securing patient a $63 grant from [facility] [place] (PAF) to provide copayment coverage for her Aromasin.  This will keep the out of pocket expense at $0.    I have made multiple attempts to contact the patient but have been unable to reach her or leave a message.   The billing information is as follows and has been shared with [place].   Member ID: [id number] Group ID: [id number] RxBin: [id number] PCN: [id number] Dates of Eligibility: 08/18/2016 through 02/15/2018  Fabio Asa. Melynda Keller, [place] Patient [place] [phone] 02/24/2017 2:40 PM

## 2017-03-03 ENCOUNTER — Ambulatory Visit
Admission: RE | Admit: 2017-03-03 | Discharge: 2017-03-03 | Disposition: A | Discharge status: Home / Self Care | Payer: Medicare Other | Source: Ambulatory Visit | Attending: Hematology | Admitting: Hematology

## 2017-03-03 DIAGNOSIS — Z13820 Encounter for screening for osteoporosis: Secondary | ICD-10-CM | POA: Diagnosis not present

## 2017-03-03 DIAGNOSIS — Z170 Estrogen receptor positive status [ER+]: Principal | ICD-10-CM

## 2017-03-03 DIAGNOSIS — C50212 Malignant neoplasm of upper-inner quadrant of left female breast: Secondary | ICD-10-CM

## 2017-03-03 DIAGNOSIS — R928 Other abnormal and inconclusive findings on diagnostic imaging of breast: Secondary | ICD-10-CM | POA: Diagnosis not present

## 2017-03-03 DIAGNOSIS — Z780 Asymptomatic menopausal state: Secondary | ICD-10-CM | POA: Diagnosis not present

## 2017-03-03 DIAGNOSIS — E2839 Other primary ovarian failure: Secondary | ICD-10-CM

## 2017-03-05 ENCOUNTER — Telehealth: Payer: Self-pay | Admitting: Internal Medicine

## 2017-03-05 NOTE — Telephone Encounter (Signed)
Pt. Called requesting a refill on Tramadol. [facility] [address]  Please f/u with pt.

## 2017-03-06 NOTE — Telephone Encounter (Signed)
Pt is aware.  

## 2017-03-06 NOTE — Telephone Encounter (Signed)
She recently picked a prescription for #120 tabs on 02/18/17 and is not due for refill yet.

## 2017-03-07 ENCOUNTER — Telehealth: Payer: Self-pay | Admitting: *Deleted

## 2017-03-07 NOTE — Telephone Encounter (Signed)
Attempted to call patient x 2 (once yesterday and once today). No answer and no voicemail available. Call made to patient regarding results of recent DEXA scan.  Pt had a normal DEXA scan.

## 2017-03-07 NOTE — Progress Notes (Signed)
Tried to call pt at home #, no answer.

## 2017-04-03 ENCOUNTER — Ambulatory Visit: Payer: Medicare Other | Attending: Internal Medicine | Admitting: Internal Medicine

## 2017-04-03 ENCOUNTER — Encounter: Payer: Self-pay | Admitting: Internal Medicine

## 2017-04-03 VITALS — BP 111/78 | HR 74 | Temp 98.2°F | Resp 16 | Ht 67.0 in | Wt 206.6 lb

## 2017-04-03 DIAGNOSIS — G8929 Other chronic pain: Secondary | ICD-10-CM | POA: Diagnosis not present

## 2017-04-03 DIAGNOSIS — Z7982 Long term (current) use of aspirin: Secondary | ICD-10-CM | POA: Diagnosis not present

## 2017-04-03 DIAGNOSIS — Z7984 Long term (current) use of oral hypoglycemic drugs: Secondary | ICD-10-CM | POA: Diagnosis not present

## 2017-04-03 DIAGNOSIS — N993 Prolapse of vaginal vault after hysterectomy: Secondary | ICD-10-CM | POA: Diagnosis not present

## 2017-04-03 DIAGNOSIS — M48062 Spinal stenosis, lumbar region with neurogenic claudication: Secondary | ICD-10-CM | POA: Diagnosis not present

## 2017-04-03 DIAGNOSIS — E041 Nontoxic single thyroid nodule: Secondary | ICD-10-CM | POA: Insufficient documentation

## 2017-04-03 DIAGNOSIS — R911 Solitary pulmonary nodule: Secondary | ICD-10-CM | POA: Insufficient documentation

## 2017-04-03 DIAGNOSIS — I10 Essential (primary) hypertension: Secondary | ICD-10-CM | POA: Diagnosis not present

## 2017-04-03 DIAGNOSIS — M47896 Other spondylosis, lumbar region: Secondary | ICD-10-CM | POA: Insufficient documentation

## 2017-04-03 DIAGNOSIS — Z853 Personal history of malignant neoplasm of breast: Secondary | ICD-10-CM | POA: Diagnosis not present

## 2017-04-03 DIAGNOSIS — M5416 Radiculopathy, lumbar region: Secondary | ICD-10-CM

## 2017-04-03 DIAGNOSIS — Z79899 Other long term (current) drug therapy: Secondary | ICD-10-CM | POA: Insufficient documentation

## 2017-04-03 DIAGNOSIS — E785 Hyperlipidemia, unspecified: Secondary | ICD-10-CM | POA: Diagnosis not present

## 2017-04-03 DIAGNOSIS — E119 Type 2 diabetes mellitus without complications: Secondary | ICD-10-CM | POA: Diagnosis not present

## 2017-04-03 DIAGNOSIS — E669 Obesity, unspecified: Secondary | ICD-10-CM | POA: Insufficient documentation

## 2017-04-03 DIAGNOSIS — F329 Major depressive disorder, single episode, unspecified: Secondary | ICD-10-CM | POA: Diagnosis not present

## 2017-04-03 DIAGNOSIS — Z923 Personal history of irradiation: Secondary | ICD-10-CM | POA: Diagnosis not present

## 2017-04-03 DIAGNOSIS — Z6832 Body mass index (BMI) 32.0-32.9, adult: Secondary | ICD-10-CM | POA: Insufficient documentation

## 2017-04-03 DIAGNOSIS — K5730 Diverticulosis of large intestine without perforation or abscess without bleeding: Secondary | ICD-10-CM | POA: Diagnosis not present

## 2017-04-03 LAB — GLUCOSE, POCT (MANUAL RESULT ENTRY): POC Glucose: 112 mg/dl — AB (ref 70–99)

## 2017-04-03 MED ORDER — TRAMADOL HCL 50 MG PO TABS: 50.0000 mg | ORAL_TABLET | ORAL | 2 refills | Status: DC | PRN

## 2017-04-03 NOTE — Progress Notes (Signed)
Patient ID: MAGALINE STEINBERG, female    DOB: 1951-01-06  MRN: [id number]  CC: Diabetes and Hypertension   Subjective: Saryna Chervenak is a 67 y.o. female who presents for chronic disease management. Her concerns today include:  Pt with hx of HTN, DM, HL, DJD lumbar and spinal stenosiswith hx of laminectomy 2016, obesity, depression, breast CA LT side treated with XRT (2015 and 2016, followed by Dr. Annamaria Boots), vaginal prolapse.  1.  LBP:  Ran out of tramadol.  Having to take Tylenol and Aleve every 2 hrs -Gabapentin helps especially with rest Not on flexeril Pain in the lower back is constant and worse with any activity including trying to do housework.  She reports good relief of pain with tramadol.  Before Tramadol pain level is 5/10, after Tramadol level is 1-2/10 No dizziness or drowsiness.  Uses Miralax day to keep BM regular  2.  DM: check BS Q 3 days.  Gives range of 89-115 No lows Doing well with eating habits Compliant with metformin. Has schedule eye exam for 05/2017  3.  HTN:  Compliant with Clonidine and HCTZ No CP/SOB.  Patient Active Problem List   Diagnosis Date Noted  . Right hip pain 08/29/2016  . 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 Lumbar pain with radiation down right leg 07/02/2016  . Positive H. pylori test 02/27/2016  . Intercostal pain 02/26/2016  . Thyroid nodule 06/09/2015  . Neck pain on right side 05/24/2015  . Lumbar stenosis with neurogenic claudication 12/15/2014  . Obesity (BMI 30-39.9) 09/08/2014  . Nodule of right lung 07/28/2014  . Diverticulosis of colon without hemorrhage 07/28/2014  . Depressed mood 03/09/2014  . DJD (degenerative joint disease), lumbar 02/22/2014  . Lumbar pain with radiation down left leg 01/17/2014  . Breast cancer of upper-inner quadrant of left female breast ([place]) 01/05/2014  . Hyperlipidemia associated with type 2 diabetes mellitus ([place]) 09/28/2013  . S/P total hysterectomy and bilateral  salpingo-oophorectomy 09/20/2013  . Morbid obesity with body mass index of 40.0-44.9 in adult ([place]) 09/17/2013  . Essential hypertension   . Diabetes mellitus ([place]) 09/16/2013     Current Outpatient Medications on File Prior to Visit  Medication Sig Dispense Refill  . Ascorbic Acid (VITAMIN C) 1000 MG tablet Take 1,000 mg by mouth daily.    Marland Kitchen aspirin 325 MG tablet Take 325 mg by mouth once.    . Blood Glucose Monitoring Suppl (TRUE METRIX METER) w/Device KIT 1 each by Does not apply route as needed. 1 kit 0  . cloNIDine (CATAPRES) 0.1 MG tablet Take 1 tablet (0.1 mg total) by mouth 2 (two) times daily. 180 tablet 3  . [facility] SENNA PLUS 8.6-50 MG tablet TAKE 2 TABLETS BY MOUTH AT BEDTIME AS NEEDED FOR MILD CONSTIPATION 60 tablet 11  . exemestane (AROMASIN) 25 MG tablet Take 1 tablet (25 mg total) by mouth daily after breakfast. 90 tablet 3  . gabapentin (NEURONTIN) 300 MG capsule 300 mg during the day, 600 mg at night by mouth 270 capsule 4  . glucose blood test strip Use as instructed 100 each 12  . hydrochlorothiazide (HYDRODIURIL) 25 MG tablet Take 1 tablet (25 mg total) by mouth daily. 90 tablet 3  . Lancets (ACCU-CHEK MULTICLIX) lancets 1 each by Other route 3 (three) times daily. ICD 10 E11.9 100 each 12  . metFORMIN (GLUCOPHAGE) 500 MG tablet Take 1 tablet (500 mg total) by mouth 2 (two) times daily. 180 tablet 3  .  Multiple Vitamins-Minerals (MULTIVITAMIN WITH MINERALS) tablet Take 1 tablet by mouth daily. Reported on 03/13/2015    . potassium chloride SA (K-DUR,KLOR-CON) 20 MEQ tablet Take 1 tablet (20 mEq total) by mouth daily. 20 tablet 0  . pravastatin (PRAVACHOL) 20 MG tablet Take 1 tablet (20 mg total) by mouth daily. 90 tablet 3  . ranitidine (ZANTAC) 150 MG tablet Take 1 tablet (150 mg total) by mouth 2 (two) times daily. 180 tablet 3  . vitamin D, CHOLECALCIFEROL, 400 UNITS tablet Take 400 Units by mouth daily.     No current facility-administered medications on file prior to  visit.     No Known Allergies  Social History   Socioeconomic History  . Marital status: Married    Spouse name: Nur Rabold   . Number of children: 3  . Years of education: 20  . Highest education level: Not on file  Social Needs  . Financial resource strain: Not on file  . Food insecurity - worry: Not on file  . Food insecurity - inability: Not on file  . Transportation needs - medical: Not on file  . Transportation needs - non-medical: Not on file  Occupational History    Employer: UNEMPLOYED  Tobacco Use  . Smoking status: Never Smoker  . Smokeless tobacco: Never Used  Substance and Sexual Activity  . Alcohol use: No  . Drug use: No  . Sexual activity: Not Currently    Birth control/protection: Post-menopausal  Other Topics Concern  . Not on file  Social History Narrative   Married to [employer] in 1986.    Has 3 children from previous marriage, 1 in [place], 1 in [place], 1 in prison ([place]).    Lives with husband.    Right-handed.   1 cup caffeine per day.    Family History  Problem Relation Age of Onset  . Diabetes Mother   . Multiple myeloma Mother 75  . Hearing loss Mother   . Diabetes Brother   . Cancer Brother 40       prostate cancer   . Diabetes Maternal Aunt   . Leukemia Maternal Aunt        dx in her 48s  . Alcoholism Brother   . Heart attack Father   . Diabetes Sister   . Diabetes Son     Past Surgical History:  Procedure Laterality Date  . ABDOMINAL HYSTERECTOMY N/A 09/20/2013   Procedure: HYSTERECTOMY ABDOMINAL;  Surgeon: Osborne Oman, MD;  Location: [facility];  Service: Gynecology;  Laterality: N/A;  . BREAST LUMPECTOMY Left 2015   radiation  . BREAST LUMPECTOMY WITH AXILLARY LYMPH NODE BIOPSY Left 12/29/13   left breast   . LAPAROSCOPIC ASSISTED VAGINAL HYSTERECTOMY  09/20/2013   Procedure: LAPAROSCOPIC ASSISTED VAGINAL HYSTERECTOMY;  Surgeon: Osborne Oman, MD;  Location: [facility];  Service: Gynecology;;  PT WAS  EXAMINED WHILE UP IN [place]IRRU[place] AND IT WAS DECIDED TO OPEN PT DUE TO LARGE MASS  . LUMBAR LAMINECTOMY/DECOMPRESSION MICRODISCECTOMY Right 12/14/2014   Procedure: Right Lumbar three-four microdiskectomy;  Surgeon: Newman Pies, MD;  Location: [facility];  Service: Neurosurgery;  Laterality: Right;  Right Lumbar three-four microdiskectomy  . RADIOACTIVE SEED GUIDED PARTIAL MASTECTOMY WITH AXILLARY SENTINEL LYMPH NODE BIOPSY Left 12/29/2013   Procedure: LEFT BREAST RADIOACTIVE SEED LOCALIZED LUMPECTOMY WITH SENTINEL LYMPH NODE MAPPING;  Surgeon: Erroll Luna, MD;  Location: [facility];  Service: General;  Laterality: Left;  . RE-EXCISION OF BREAST LUMPECTOMY Left 03/02/2014   Procedure:  RE-EXCISION OF LEFT BREAST LUMPECTOMY;  Surgeon: Erroll Luna, MD;  Location: [facility];  Service: General;  Laterality: Left;  . SALPINGOOPHORECTOMY  09/20/2013   Procedure: SALPINGO OOPHORECTOMY;  Surgeon: Osborne Oman, MD;  Location: [facility];  Service: Gynecology;;    ROS: Review of Systems Negative except as stated above. PHYSICAL EXAM: BP 111/78   Pulse 74   Temp 98.2 F (36.8 C) (Oral)   Resp 16   Ht '5\' 7"'  (1.702 m)   Wt 206 lb 9.6 oz (93.7 kg)   SpO2 97%   BMI 32.36 kg/m   Physical Exam  General appearance - alert, well appearing, and in no distress Mental status - alert, oriented to person, place, and time, normal mood, behavior, speech, dress, motor activity, and thought processes Neck - supple, no significant adenopathy Chest - clear to auscultation, no wheezes, rales or rhonchi, symmetric air entry Heart - normal rate, regular rhythm, normal S1, S2, no murmurs, rubs, clicks or gallops Extremities - peripheral pulses normal, no pedal edema, no clubbing or cyanosis  BS 112  Lab Results  Component Value Date   WBC 7.1 11/08/2016   HGB 14.5 11/08/2016   HCT 44.2 11/08/2016   MCV 85.3 11/08/2016   PLT 295 11/08/2016     Chemistry      Component  Value Date/Time   NA 143 11/08/2016 0915   K 3.7 01/02/2017 0941   K 3.0 (LL) 11/08/2016 0915   CL 102 08/17/2015 2252   CO2 30 (H) 11/08/2016 0915   BUN 18.9 11/08/2016 0915   CREATININE 1.2 (H) 11/08/2016 0915      Component Value Date/Time   CALCIUM 10.2 11/08/2016 0915   ALKPHOS 68 11/08/2016 0915   AST 24 11/08/2016 0915   ALT 23 11/08/2016 0915   BILITOT 0.57 11/08/2016 0915     Repeat K 3.7 01/02/2017 ASSESSMENT AND PLAN: 1. Type 2 diabetes mellitus without complication, without long-term current use of insulin (HCC) At goal. Continue metformin and healthy eating habits.  Try to stay active. 2. Essential hypertension At goal on HCTZ and clonidine.  Since blood pressure well controlled we will keep her on this rather than switching to lisinopril  3. Chronic radicular pain of lower back -Patient reports benefit from taking tramadol.  Allows pain control to the extent that she is able to function.  Denies any significant side effects of the medication.  Controlled substance prescribing agreement updated 12/2016. [place] controlled substance reporting system reviewed and is appropriate. - POCT glucose (manual entry) - traMADol (ULTRAM) 50 MG tablet; Take 1 tablet (50 mg total) by mouth every 4 (four) hours as needed for moderate pain.  Dispense: 120 tablet; Refill: 2   Patient was given the opportunity to ask questions.  Patient verbalized understanding of the plan and was able to repeat key elements of the plan.   Orders Placed This Encounter  Procedures  . POCT glucose (manual entry)     Requested Prescriptions   Signed Prescriptions Disp Refills  . traMADol (ULTRAM) 50 MG tablet 120 tablet 2    Sig: Take 1 tablet (50 mg total) by mouth every 4 (four) hours as needed for moderate pain.    Return in about 3 months (around 07/01/2017).  Karle Plumber, MD, FACP

## 2017-05-07 NOTE — Progress Notes (Signed)
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 Ultrasound of the left axilla is negative.         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       12/29/2013 Surgery    Left lumpectomy with close (<0.1cm) inferior margin      03/02/2014 Surgery    Reexcision for close margin, path negative for malignancy.      04/05/2014 - 05/20/2014 Radiation Therapy    adjuvant breast radiation       05/27/2014 Imaging    Bone density scan: normal       06/07/2014 -  Anti-estrogen oral therapy    Exemestane 25 mg daily, she stopped in Nov 2018 due to high copay. Change anastrozole on 05/09/2017       06/20/2015 Imaging    Bone scan 06/20/2015 IMPRESSION: No evidence of metastatic disease. Mild increased activity dorsal aspect right midfoot probable degenerative in nature. Clinical correlation is necessary.      02/29/2016  Mammogram    MM DIAG BREAST TOMO BIALTERAL 02/29/16 IMPRESSION: Stable left breast lumpectomy site. No mammographic evidence of malignancy in the bilateral breasts.      03/03/2017 Mammogram    IMPRESSION: 1. No mammographic evidence of malignancy in either breast. 2. Stable left breast posttreatment changes.      CURRENT THERAPY: Exemestane 77m daily, started on 06/07/2014. She stopped in Nov 2018 due to high copay. Change anastrozole on 05/09/2017                                     INTERIM HISTORY:  Rachel SALADOis here for a follow up of her left breast cancer. She was last seen by me 6 months ago. She presents to the clinic today by herself. She reports she is doing well overall. She states she is not taking Exemestane and she stopped in November due to high co-pay.   On review of systems, pt denies any other complaints at this time. Pertinent positives are listed and detailed within the above HPI.   MEDICAL HISTORY:  Past Medical History:  Diagnosis Date  . Breast cancer ([facility] 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  . Diabetes mellitus ([facility] 09/16/13   Diagnosed on 09/16/13; HgA1C was 7.2/metformin  . Dizziness   .  GERD (gastroesophageal reflux disease)   . Headache   . Hyperlipidemia   . Hypertension 2014  . Low back pain   . Obesity, morbid, BMI 40.0-49.9 ([place])   . PONV (postoperative nausea and vomiting)   . S/P radiation therapy 04/05/14-05/20/14   left breast 60.4Gy totaldose    SURGICAL HISTORY: Past Surgical History:  Procedure Laterality Date  . ABDOMINAL HYSTERECTOMY N/A 09/20/2013   Procedure: HYSTERECTOMY ABDOMINAL;  Surgeon: Osborne Oman, MD;  Location: [facility];  Service: Gynecology;  Laterality: N/A;  . BREAST LUMPECTOMY Left 2015   radiation  . BREAST LUMPECTOMY WITH AXILLARY LYMPH NODE BIOPSY Left 12/29/13   left breast   . LAPAROSCOPIC ASSISTED VAGINAL HYSTERECTOMY  09/20/2013   Procedure: LAPAROSCOPIC ASSISTED VAGINAL HYSTERECTOMY;   Surgeon: Osborne Oman, MD;  Location: [facility];  Service: Gynecology;;  PT WAS EXAMINED WHILE UP IN [place] AND IT WAS DECIDED TO OPEN PT DUE TO LARGE MASS  . LUMBAR LAMINECTOMY/DECOMPRESSION MICRODISCECTOMY Right 12/14/2014   Procedure: Right Lumbar three-four microdiskectomy;  Surgeon: Newman Pies, MD;  Location: [facility];  Service: Neurosurgery;  Laterality: Right;  Right Lumbar three-four microdiskectomy  . RADIOACTIVE SEED GUIDED PARTIAL MASTECTOMY WITH AXILLARY SENTINEL LYMPH NODE BIOPSY Left 12/29/2013   Procedure: LEFT BREAST RADIOACTIVE SEED LOCALIZED LUMPECTOMY WITH SENTINEL LYMPH NODE MAPPING;  Surgeon: Erroll Luna, MD;  Location: [facility];  Service: General;  Laterality: Left;  . RE-EXCISION OF BREAST LUMPECTOMY Left 03/02/2014   Procedure: RE-EXCISION OF LEFT BREAST LUMPECTOMY;  Surgeon: Erroll Luna, MD;  Location: [facility];  Service: General;  Laterality: Left;  . SALPINGOOPHORECTOMY  09/20/2013   Procedure: SALPINGO OOPHORECTOMY;  Surgeon: Osborne Oman, MD;  Location: [facility];  Service: Gynecology;;    SOCIAL HISTORY: Social History   Socioeconomic History  . Marital status: Married    Spouse name: Soriya Worster   . Number of children: 3  . Years of education: 36  . Highest education level: Not on file  Occupational History    Employer: UNEMPLOYED  Social Needs  . Financial resource strain: Not on file  . Food insecurity:    Worry: Not on file    Inability: Not on file  . Transportation needs:    Medical: Not on file    Non-medical: Not on file  Tobacco Use  . Smoking status: Never Smoker  . Smokeless tobacco: Never Used  Substance and Sexual Activity  . Alcohol use: No  . Drug use: No  . Sexual activity: Not Currently    Birth control/protection: Post-menopausal  Lifestyle  . Physical activity:    Days per week: Not on file    Minutes per session: Not on file  . Stress: Not on file  Relationships  .  Social connections:    Talks on phone: Not on file    Gets together: Not on file    Attends religious service: Not on file    Active member of club or organization: Not on file    Attends meetings of clubs or organizations: Not on file    Relationship status: Not on file  . Intimate partner violence:    Fear of current or ex partner: Not on file    Emotionally abused: Not on file    Physically abused: Not on file    Forced sexual activity: Not on file  Other Topics Concern  . Not on file  Social History Narrative   Married to [employer] in 1986.  Has 3 children from previous marriage, 1 in [place], 1 in [place], 1 in prison ([place]).    Lives with husband.    Right-handed.   1 cup caffeine per day.    FAMILY HISTORY: Family History  Problem Relation Age of Onset  . Diabetes Mother   . Multiple myeloma Mother 72  . Hearing loss Mother   . Diabetes Brother   . Cancer Brother 40       prostate cancer   . Diabetes Maternal Aunt   . Leukemia Maternal Aunt        dx in her 36s  . Alcoholism Brother   . Heart attack Father   . Diabetes Sister   . Diabetes Son     ALLERGIES:  has No Known Allergies.  MEDICATIONS:  Current Outpatient Medications  Medication Sig Dispense Refill  . anastrozole (ARIMIDEX) 1 MG tablet Take 1 tablet (1 mg total) by mouth daily. 30 tablet 5  . Ascorbic Acid (VITAMIN C) 1000 MG tablet Take 1,000 mg by mouth daily.    Marland Kitchen aspirin 325 MG tablet Take 325 mg by mouth once.    . Blood Glucose Monitoring Suppl (TRUE METRIX METER) w/Device KIT 1 each by Does not apply route as needed. 1 kit 0  . cloNIDine (CATAPRES) 0.1 MG tablet Take 1 tablet (0.1 mg total) by mouth 2 (two) times daily. 180 tablet 3  . [employer] SENNA PLUS 8.6-50 MG tablet TAKE 2 TABLETS BY MOUTH AT BEDTIME AS NEEDED FOR MILD CONSTIPATION 60 tablet 11  . gabapentin (NEURONTIN) 300 MG capsule 300 mg during the day, 600 mg at night by mouth 270 capsule 4  . glucose blood test strip  Use as instructed 100 each 12  . hydrochlorothiazide (HYDRODIURIL) 25 MG tablet Take 1 tablet (25 mg total) by mouth daily. 90 tablet 3  . Lancets (ACCU-CHEK MULTICLIX) lancets 1 each by Other route 3 (three) times daily. ICD 10 E11.9 100 each 12  . metFORMIN (GLUCOPHAGE) 500 MG tablet Take 1 tablet (500 mg total) by mouth 2 (two) times daily. 180 tablet 3  . Multiple Vitamins-Minerals (MULTIVITAMIN WITH MINERALS) tablet Take 1 tablet by mouth daily. Reported on 03/13/2015    . pravastatin (PRAVACHOL) 20 MG tablet Take 1 tablet (20 mg total) by mouth daily. 90 tablet 3  . ranitidine (ZANTAC) 150 MG tablet Take 1 tablet (150 mg total) by mouth 2 (two) times daily. 180 tablet 3  . traMADol (ULTRAM) 50 MG tablet Take 1 tablet (50 mg total) by mouth every 4 (four) hours as needed for moderate pain. 120 tablet 2  . vitamin D, CHOLECALCIFEROL, 400 UNITS tablet Take 400 Units by mouth daily.     No current facility-administered medications for this visit.     REVIEW OF SYSTEMS:   Constitutional: Denies fevers, chills or abnormal night sweats Eyes: No vision difficulties. Ears, nose, mouth, throat, and face: Denies mucositis or sore throat Respiratory: No respiratory difficulty Cardiovascular: Denies palpitation, chest discomfort or lower extremity swelling Gastrointestinal:  Denies nausea, heartburn or change in bowel habits  Skin: Denies abnormal skin rashes Lymphatics: Denies new lymphadenopathy or easy bruising Neurological:Denies numbness, tingling or new weaknesses Behavioral/Psych: Mood is stable, no new changes  All other systems were reviewed with the patient and are negative.  PHYSICAL EXAMINATION:  ECOG PERFORMANCE STATUS: 2  BP 136/82 (BP Location: Left Arm, Patient Position: Sitting)   Pulse 61   Temp 97.8 F (36.6 C) (Oral)   Resp 18  Ht _0  (1.702 m)   Wt 210 lb 8 oz (95.5 kg)   SpO2 100%   BMI 32.97 kg/m    GENERAL:alert, no distress and comfortable SKIN: skin color,  texture, turgor are normal, no rashes or significant lesions EYES: normal, conjunctiva are pink and non-injected, sclera clear OROPHARYNX:no exudate, no erythema and lips, buccal mucosa, and tongue normal  NECK: supple, thyroid normal size, non-tender, without nodularity LYMPH:  no palpable lymphadenopathy in the cervical, axillary or inguinal LUNGS: clear to auscultation and percussion with normal breathing effort HEART: regular rate & rhythm and no murmurs and no lower extremity edema ABDOMEN:abdomen soft, nontender, no rebound pain normal bowel sounds (+) Tenderness in the epigastric area. Musculoskeletal:no cyanosis of digits and no clubbing PSYCH: alert & oriented x 3 with fluent speech NEURO: no focal motor/sensory deficits Breasts: Breast inspection showed them to be symmetrical with no nipple discharge. Surgical scar at the left upper breast and axillary area are well healed. Palpation of the right breasts and axilla revealed no obvious mass that I could appreciate.   I have reviewed the data as listed LABORATORY DATA:  CBC Latest Ref Rng & Units 05/09/2017 11/08/2016 05/10/2016  WBC 3.9 - 10.3 K/uL 6.1 7.1 5.7  Hemoglobin 11.6 - 15.9 g/dL 12.3 14.5 12.6  Hematocrit 34.8 - 46.6 % 36.9 44.2 38.8  Platelets 145 - 400 K/uL 248 295 238    CMP Latest Ref Rng & Units 05/09/2017 01/02/2017 11/08/2016  Glucose 70 - 140 mg/dL 94 - 114  BUN 7 - 26 mg/dL 22 - 18.9  Creatinine 0.60 - 1.10 mg/dL 1.06 - 1.2(H)  Sodium 136 - 145 mmol/L 144 - 143  Potassium 3.5 - 5.1 mmol/L 3.4(L) 3.7 3.0(LL)  Chloride 98 - 109 mmol/L 106 - -  CO2 22 - 29 mmol/L 26 - 30(H)  Calcium 8.4 - 10.4 mg/dL 9.5 - 10.2  Total Protein 6.4 - 8.3 g/dL 7.1 - 7.9  Total Bilirubin 0.2 - 1.2 mg/dL 0.2 - 0.57  Alkaline Phos 40 - 150 U/L 59 - 68  AST 5 - 34 U/L 17 - 24  ALT 0 - 55 U/L 16 - 23     PATHOLOGY REPORT Diagnosis 12/29/2013 1. Breast, lumpectomy, left - INVASIVE GRADE II DUCTAL CARCINOMA, SPANNING 1.7 CM IN GREATEST  DIMENSION. - SECOND FOCUS OF INVASIVE GRADE I DUCTAL CARCINOMA, SPANNING 0.5 CM IN GREATEST DIMENSION. - INTERMEDIATE GRADE DUCTAL CARCINOMA IN SITU ASSOCIATED WITH BOTH FOCI OF TUMOR. - INVASIVE DUCTAL CARCINOMA IS EXTREMELY CLOSE (LESS THAN 0.1 CM) TO INFERIOR MARGIN. - DUCTAL CARCINOMA IN SITU IS CLOSE (0.1 CM) TO INFERIOR MARGIN. - OTHER MARGINS NEGATIVE. - SEE ONCOLOGY TEMPLATE. 2. Lymph node, sentinel, biopsy, Left axillary 1 of 4 FINAL for ALBA, PERILLO ([id number]) Diagnosis(continued) - ONE BENIGN LYMPH NODE WITH NO TUMOR SEEN (0/1). 3. Lymph node, sentinel, biopsy, Left axilla - ONE BENIGN LYMPH NODE WITH NO TUMOR SEEN (0/1). Microscopic Comment 1. BREAST, INVASIVE TUMOR, WITH LYMPH NODES PRESENT Specimen, including laterality and lymph node sampling (sentinel, non-sentinel): Left partial breast with left sentinel lymph node sampling. Procedure: Left breast lumpectomy with sentinel lymph node biopsies. Histologic type: Invasive ductal carcinoma. Larger focus: Grade: II. Tubule formation: 2. Nuclear pleomorphism: 2. Mitotic: 3. Smaller focus (near inferior margin): Grade: I. Tubule formation: 1. Nuclear pleomorphism: 2. Mitotic: 1. Tumor sizes (gross and glass slide measurement): 1.7 cm and 0.5 cm. Margins: Invasive, distance to closest margin: less than 0.1 cm (inferior margin). In-situ, distance to closest  margin: 0.1 cm (inferior margin). Lymphovascular invasion: Definitive lymphovascular invasion is not identified. Ductal carcinoma in situ: Yes. Grade: Intermediate grade. Extensive intraductal component: There is an extensive intraductal component of ductal carcinoma in situ on the smaller focus but not on the larger focus. Lobular neoplasia: No. Tumor focality: Two foci, multifocal. Treatment effect: N/A. Extent of tumor: Tumor confined to breast parenchyma. Skin: Not received. Nipple: Not received. Skeletal muscle: Not received. Lymph  nodes: Examined: 2 Sentinel. 0 Non-sentinel. 2 Total. Lymph nodes with metastasis: 0. Breast prognostic profile: Performed on previous case ([id number]). Estrogen receptor: 100%, positive. Progesterone receptor: 96%, positive. Her 2 neu by CISH: 0.81 ratio, no amplification. Ki-67: 73%. Non-neoplastic breast: Fat necrosis. TNM: pT1c(m), pN0, MX. Comments: A smooth muscle myosin, calponin, and p63 immunohistochemical stain are performed on a single block, which helps to confirm the presence of a second focus of invasive ductal carcinoma associated 2 of 4 FINAL for TYYONNA, SOUCY ([id number]) Microscopic Comment(continued) with ductal carcinoma in situ. As Her-2 neu by CISH was negative on the initial biopsy, this will be repeated on the larger tumor focus and reported in an addendum to follow. A breast prognostic profile will not be performed on the smaller tumor focus unless otherwise requested (the tumor foci although different grades show morphologically similar nuclear features). (RH:ds 12/31/13)  Oncotype DX Score: 23 (Intermediate risk) (ER+/PR+/HER2-)   Diagnosis 05/09/2015 THYROID, FINE NEEDLE ASPIRATION, RLP (SPECIMEN 1 OF 2 COLLECTED 05-09-2015) CONSISTENT WITH BENIGN FOLLICULAR NODULE (BETHESDA CATEGORY II).  Diagnosis 05/09/2015 THYROID, FINE NEEDLE ASPIRATION, LMP (SPECIMEN 2 OF 2 COLLECTED 05-09-2015) CONSISTENT WITH BENIGN FOLLICULAR NODULE (BETHESDA CATEGORY II).   RADIOGRAPHIC STUDIES: I have personally reviewed the radiological images as listed and agreed with the findings in the report.  Diagnostic Mammogram 03/03/17 IMPRESSION: 1. No mammographic evidence of malignancy in either breast. 2. Stable left breast posttreatment changes.  DEXA 03/03/17 ASSESSMENT: The BMD measured at Femur Neck Left is 0.992 g/cm2 with a T-score of -0.3.  CT AP W contrast 11/07/16 IMPRESSION: Stable nodular densities in the right middle and right lower lobes dating back to  2016. Given their stability these are felt to be benign in etiology. Diverticulosis without diverticulitis. Dependent density within the gallbladder which may represent a small polyp or poorly calcified stone.   Diagnostic mammogram 02/29/16 IMPRESSION: Stable left breast lumpectomy site. No mammographic evidence of malignancy in the bilateral breasts. RECOMMENDATION: Diagnostic mammogram is suggested in 1 year. (Code:[id number]-01Y)  Bone scan 06/20/2015 IMPRESSION: No evidence of metastatic disease. Mild increased activity dorsal aspect right midfoot probable degenerative in nature. Clinical correlation is necessary.   US SOFT TISSUE HEAD AND NECK 04/03/2015 IMPRESSION: Multi bilateral nodules. Largest on the right measures 19 mm. Largest on left measures 21 mm. Findings meet consensus criteria for biopsy. Ultrasound-guided fine needle aspiration should be considered, as per the consensus statement: Management of Thyroid Nodules Detected at [place]: Society of Radiologists in [place]. Radiology 2005; [id number].  Mammogram 02/14/2015 IMPRESSION: No findings worrisome for recurrent tumor or developing malignancy. RECOMMENDATION: Bilateral diagnostic mammography in 1 year.   ASSESSMENT & PLAN:  67 y.o. Caucasian female, with past medical history of hypertension, diabetes, dyslipidemia, obesity who was found to have a left breast mass by screening mammogram.  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 Grade 2, Ki-67 73%. -She is status post complete surgical resection with lumpectomy. -Her breast cancer is likely cured  by surgery alone, but does has some risk of disease recurrence in the future. -I previously discussed the Oncotype DX result with her and her husband. The recurrence score is 23, with the predicted 10 year recurrence risk of 15% with  tamoxifen alone. The benefit of chemotherapy is uncertain in this intermediate risk group. I do not strongly recommend adjuvant chemotherapy. Patient agrees with not having chemotherapy.  -Due to the strong positivity of ER and PR, I recommended adjuvant  Aromasin for total 5 years - She had new onset bilateral rib pain and midthoracic pain in 06/2015, I ordered a bone scan which was negative for bone metastasis. -She was tolerating Exemestane well. She stopped in Nov 2017 due to high co pay. I will switch her to Anastrozole today and advised her to contact me if she ever has any problem with her anti-estrogen therapy. -she is clinically doing well, labs reviewed, CBC is WNL and CMP is pending. Her physical exam unremarkable today. Her last mammogram on 03/03/2017 was normal. No evidence of recurrence. She will continue surveillance. -f/u in 4 months  #2 Bone health -Her bone density scan was normal in 05/2014 -We previously discussed Aromasin may weak her bone, she will continue calcium and vitamin D. -Normal bone density, T score -0.3 on 03/03/17  #3. hypertension, diabetes, dyslipidemia, chronic low back pain -She will continue follow-up with her primary care physician.  #4 Morbid obesity -We previously discussed healthy diet and regular exercise. She is willing to try after she recovers well from her back surgery  #5 right lung nodule -She had a CT abdomen and pelvis in June 2016, which incidentally found a 4 mm nodule in the right lower lobe. I personally reviewed the image.  -She never smoked, risk of lung cancer is low  -her repeated CT chest from 12/09/2014, which showed stable small lung nodules, likely benign. -she had CT chest on 08/18/2015 for chest pain, which showed stable 43m right lung base subpleural nodule, likely benign, no need to follow-up since she is a never smoker   #6. Thyroid  Benign follicular nodule -Her CT scan revealed a 1.7 cm right thyroid nodule. - ultrasound  guided thyroid nodule biopsy on 05/09/2015 showed benign follicular nodule   #7 epigastric pain/GI issues -Uncertain etiology. She has been following up with her primary care physician -unlikely related to exemestane -She had CT scan by Dr. MRodena Pietyfor her Dropped bladder and Rectal growth.  -She will underwent surgery for both on 01/20/17.   PLAN: -Discontinued Exemestane (Nov 2018) due to high copay, will start Anastrozole this week, filled 6 months today -Lab and f/u in 4 months   All questions were answered. The patient knows to call the clinic with any problems, questions or concerns.  I spent 20 minutes counseling the patient face to face. The total time spent in the appointment was 25 minutes and more than 50% was on counseling.   This document serves as a record of services personally performed by YTruitt Merle MD. It was created on her behalf by DTheresia Bough a trained medical scribe. The creation of this record is based on the scribe's personal observations and the provider's statements to them.   I have reviewed the above documentation for accuracy and completeness, and I agree with the above.    YTruitt Merle MD 05/09/2017  9:23 AM

## 2017-05-09 ENCOUNTER — Encounter: Payer: Self-pay | Admitting: Hematology

## 2017-05-09 ENCOUNTER — Inpatient Hospital Stay: Payer: Medicare Other | Attending: Hematology | Admitting: Hematology

## 2017-05-09 ENCOUNTER — Inpatient Hospital Stay: Payer: Medicare Other

## 2017-05-09 ENCOUNTER — Telehealth: Payer: Self-pay

## 2017-05-09 VITALS — BP 136/82 | HR 61 | Temp 97.8°F | Resp 18 | Ht 67.0 in | Wt 210.5 lb

## 2017-05-09 DIAGNOSIS — Z170 Estrogen receptor positive status [ER+]: Secondary | ICD-10-CM | POA: Diagnosis not present

## 2017-05-09 DIAGNOSIS — Z79811 Long term (current) use of aromatase inhibitors: Secondary | ICD-10-CM | POA: Diagnosis not present

## 2017-05-09 DIAGNOSIS — Z806 Family history of leukemia: Secondary | ICD-10-CM | POA: Insufficient documentation

## 2017-05-09 DIAGNOSIS — Z8042 Family history of malignant neoplasm of prostate: Secondary | ICD-10-CM | POA: Diagnosis not present

## 2017-05-09 DIAGNOSIS — Z7984 Long term (current) use of oral hypoglycemic drugs: Secondary | ICD-10-CM

## 2017-05-09 DIAGNOSIS — C50212 Malignant neoplasm of upper-inner quadrant of left female breast: Secondary | ICD-10-CM | POA: Insufficient documentation

## 2017-05-09 DIAGNOSIS — E119 Type 2 diabetes mellitus without complications: Secondary | ICD-10-CM | POA: Insufficient documentation

## 2017-05-09 DIAGNOSIS — Z7982 Long term (current) use of aspirin: Secondary | ICD-10-CM | POA: Insufficient documentation

## 2017-05-09 DIAGNOSIS — Z79899 Other long term (current) drug therapy: Secondary | ICD-10-CM | POA: Diagnosis not present

## 2017-05-09 DIAGNOSIS — I10 Essential (primary) hypertension: Secondary | ICD-10-CM

## 2017-05-09 LAB — COMPREHENSIVE METABOLIC PANEL
ALT: 16 U/L (ref 0–55)
AST: 17 U/L (ref 5–34)
Albumin: 3.6 g/dL (ref 3.5–5.0)
Alkaline Phosphatase: 59 U/L (ref 40–150)
Anion gap: 12 — ABNORMAL HIGH (ref 3–11)
BUN: 22 mg/dL (ref 7–26)
CO2: 26 mmol/L (ref 22–29)
Calcium: 9.5 mg/dL (ref 8.4–10.4)
Chloride: 106 mmol/L (ref 98–109)
Creatinine, Ser: 1.06 mg/dL (ref 0.60–1.10)
GFR calc Af Amer: >60 mL/min (ref >60)
GFR calc non Af Amer: 53 mL/min — ABNORMAL LOW (ref >60)
Glucose, Bld: 94 mg/dL (ref 70–140)
Potassium: 3.4 mmol/L — ABNORMAL LOW (ref 3.5–5.1)
Sodium: 144 mmol/L (ref 136–145)
Total Bilirubin: 0.2 mg/dL (ref 0.2–1.2)
Total Protein: 7.1 g/dL (ref 6.4–8.3)

## 2017-05-09 LAB — CBC WITH DIFFERENTIAL/PLATELET
Basophils Absolute: 0 10*3/uL (ref 0.0–0.1)
Basophils Relative: 1 %
Eosinophils Absolute: 0.2 10*3/uL (ref 0.0–0.5)
Eosinophils Relative: 3 %
HCT: 36.9 % (ref 34.8–46.6)
Hemoglobin: 12.3 g/dL (ref 11.6–15.9)
Lymphocytes Relative: 32 %
Lymphs Abs: 1.9 10*3/uL (ref 0.9–3.3)
MCH: 29 pg (ref 25.1–34.0)
MCHC: 33.4 g/dL (ref 31.5–36.0)
MCV: 86.8 fL (ref 79.5–101.0)
Monocytes Absolute: 0.5 10*3/uL (ref 0.1–0.9)
Monocytes Relative: 8 %
Neutro Abs: 3.5 10*3/uL (ref 1.5–6.5)
Neutrophils Relative %: 56 %
Platelets: 248 10*3/uL (ref 145–400)
RBC: 4.25 MIL/uL (ref 3.70–5.45)
RDW: 13.9 % (ref 11.2–14.5)
WBC: 6.1 10*3/uL (ref 3.9–10.3)

## 2017-05-09 MED ORDER — ANASTROZOLE 1 MG PO TABS: 1.0000 mg | ORAL_TABLET | Freq: Every day | ORAL | 5 refills | Status: DC

## 2017-05-09 NOTE — Telephone Encounter (Signed)
Printed avs and calender of upcoming appointment. Per 4/5 los 

## 2017-06-04 DIAGNOSIS — N9489 Other specified conditions associated with female genital organs and menstrual cycle: Secondary | ICD-10-CM | POA: Insufficient documentation

## 2017-06-04 DIAGNOSIS — K6289 Other specified diseases of anus and rectum: Secondary | ICD-10-CM | POA: Diagnosis not present

## 2017-06-04 DIAGNOSIS — M6289 Other specified disorders of muscle: Secondary | ICD-10-CM | POA: Insufficient documentation

## 2017-06-04 DIAGNOSIS — K5901 Slow transit constipation: Secondary | ICD-10-CM | POA: Diagnosis not present

## 2017-06-04 DIAGNOSIS — G8929 Other chronic pain: Secondary | ICD-10-CM | POA: Diagnosis not present

## 2017-06-04 DIAGNOSIS — M549 Dorsalgia, unspecified: Secondary | ICD-10-CM | POA: Diagnosis not present

## 2017-07-01 ENCOUNTER — Encounter: Payer: Self-pay | Admitting: Internal Medicine

## 2017-07-01 ENCOUNTER — Ambulatory Visit: Payer: Medicare Other | Attending: Internal Medicine | Admitting: Internal Medicine

## 2017-07-01 VITALS — BP 148/84 | HR 63 | Temp 98.2°F | Resp 16 | Wt 207.2 lb

## 2017-07-01 DIAGNOSIS — M1611 Unilateral primary osteoarthritis, right hip: Secondary | ICD-10-CM | POA: Insufficient documentation

## 2017-07-01 DIAGNOSIS — E669 Obesity, unspecified: Secondary | ICD-10-CM | POA: Insufficient documentation

## 2017-07-01 DIAGNOSIS — M4726 Other spondylosis with radiculopathy, lumbar region: Secondary | ICD-10-CM | POA: Diagnosis not present

## 2017-07-01 DIAGNOSIS — M5416 Radiculopathy, lumbar region: Secondary | ICD-10-CM | POA: Diagnosis not present

## 2017-07-01 DIAGNOSIS — K5730 Diverticulosis of large intestine without perforation or abscess without bleeding: Secondary | ICD-10-CM | POA: Diagnosis not present

## 2017-07-01 DIAGNOSIS — Z7984 Long term (current) use of oral hypoglycemic drugs: Secondary | ICD-10-CM | POA: Diagnosis not present

## 2017-07-01 DIAGNOSIS — E119 Type 2 diabetes mellitus without complications: Secondary | ICD-10-CM

## 2017-07-01 DIAGNOSIS — E876 Hypokalemia: Secondary | ICD-10-CM | POA: Diagnosis not present

## 2017-07-01 DIAGNOSIS — F329 Major depressive disorder, single episode, unspecified: Secondary | ICD-10-CM | POA: Insufficient documentation

## 2017-07-01 DIAGNOSIS — Z853 Personal history of malignant neoplasm of breast: Secondary | ICD-10-CM | POA: Diagnosis not present

## 2017-07-01 DIAGNOSIS — E041 Nontoxic single thyroid nodule: Secondary | ICD-10-CM | POA: Insufficient documentation

## 2017-07-01 DIAGNOSIS — I10 Essential (primary) hypertension: Secondary | ICD-10-CM | POA: Diagnosis not present

## 2017-07-01 DIAGNOSIS — Z79899 Other long term (current) drug therapy: Secondary | ICD-10-CM | POA: Insufficient documentation

## 2017-07-01 DIAGNOSIS — Z7982 Long term (current) use of aspirin: Secondary | ICD-10-CM | POA: Diagnosis not present

## 2017-07-01 DIAGNOSIS — E1169 Type 2 diabetes mellitus with other specified complication: Secondary | ICD-10-CM

## 2017-07-01 DIAGNOSIS — M48062 Spinal stenosis, lumbar region with neurogenic claudication: Secondary | ICD-10-CM | POA: Insufficient documentation

## 2017-07-01 DIAGNOSIS — Z6841 Body Mass Index (BMI) 40.0 and over, adult: Secondary | ICD-10-CM | POA: Insufficient documentation

## 2017-07-01 DIAGNOSIS — G8929 Other chronic pain: Secondary | ICD-10-CM | POA: Diagnosis not present

## 2017-07-01 DIAGNOSIS — E785 Hyperlipidemia, unspecified: Secondary | ICD-10-CM | POA: Insufficient documentation

## 2017-07-01 LAB — POCT GLYCOSYLATED HEMOGLOBIN (HGB A1C): HbA1c, POC (prediabetic range): 5.7 % (ref 5.7–6.4)

## 2017-07-01 LAB — GLUCOSE, POCT (MANUAL RESULT ENTRY): POC Glucose: 112 mg/dl — AB (ref 70–99)

## 2017-07-01 MED ORDER — TRAMADOL HCL 50 MG PO TABS: 50.0000 mg | ORAL_TABLET | ORAL | 2 refills | Status: DC | PRN

## 2017-07-01 MED ORDER — POTASSIUM CHLORIDE ER 10 MEQ PO TBCR: 10.0000 meq | EXTENDED_RELEASE_TABLET | Freq: Every day | ORAL | 2 refills | Status: DC

## 2017-07-01 NOTE — Progress Notes (Signed)
Patient ID: Rachel Herman, female    DOB: 09-Dec-1950  MRN: [id number]  CC: Diabetes   Subjective: Rachel Herman is a 67 y.o. female who presents for chronic ds management.  Last seen 04/03/2017. Her concerns today include:  Pt with hx of HTN, DM, HL, DJD lumbar and spinal stenosiswith hx of laminectomy 2016, obesity, depression, Ductal  CA LT s/p lumpectomy/XRT (2015 and 2016, followed by Dr. Annamaria Herman), vaginal prolapse.  C/o pain RT hip x 1 mth; worse in last wk.  Pain in the groin and radiates done the side of leg to the level of knee.   -no initiating factors -similar flare last yr requiring steroid inj by ortho.  Good results with the inj. X-ray 08/2016 revealed moderate  DJD RT hip  Pain in lower back still there. Tried stopping the Tramadol for 2 wks and just used Aleve.  "My back won't let me do nothing.  Can't clean my yard or nothing.  Once I get back on Tramadol I was okay."  Having to take it every 4 hrs Denies an significant S.E from Tramadol  DM: checks BS Q 3 days with range of 87-118 Eating habits: "I don't have no appetite.  I don't know why I'm gaining wgh."  Usually eats 2 solid meals a day with snack at nights like a salad or pack of nabs.  Exercise:  Active in yard but not in past wk due to hip pain Med: compliant with Metformin  HTN:  Compliant with meds and salt restriction. No CP/SOB/LE edema No device to check BP  HL:  Tolerating Pravastatin  Patient Active Problem List   Diagnosis Date Noted  . Right hip pain 08/29/2016  . 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 Lumbar pain with radiation down right leg 07/02/2016  . Positive H. pylori test 02/27/2016  . Intercostal pain 02/26/2016  . Thyroid nodule 06/09/2015  . Neck pain on right side 05/24/2015  . Lumbar stenosis with neurogenic claudication 12/15/2014  . Obesity (BMI 30-39.9) 09/08/2014  . Nodule of right lung 07/28/2014  . Diverticulosis of colon without hemorrhage  07/28/2014  . Depressed mood 03/09/2014  . DJD (degenerative joint disease), lumbar 02/22/2014  . Lumbar pain with radiation down left leg 01/17/2014  . Breast cancer of upper-inner quadrant of left female breast ([place]) 01/05/2014  . Hyperlipidemia associated with type 2 diabetes mellitus ([place]) 09/28/2013  . S/P total hysterectomy and bilateral salpingo-oophorectomy 09/20/2013  . Morbid obesity with body mass index of 40.0-44.9 in adult ([place]) 09/17/2013  . Essential hypertension   . Diabetes mellitus ([place]) 09/16/2013     Current Outpatient Medications on File Prior to Visit  Medication Sig Dispense Refill  . anastrozole (ARIMIDEX) 1 MG tablet Take 1 tablet (1 mg total) by mouth daily. 30 tablet 5  . Ascorbic Acid (VITAMIN C) 1000 MG tablet Take 1,000 mg by mouth daily.    Marland Kitchen aspirin 325 MG tablet Take 325 mg by mouth once.    . Blood Glucose Monitoring Suppl (TRUE METRIX METER) w/Device KIT 1 each by Does not apply route as needed. 1 kit 0  . cloNIDine (CATAPRES) 0.1 MG tablet Take 1 tablet (0.1 mg total) by mouth 2 (two) times daily. 180 tablet 3  . [employer] SENNA PLUS 8.6-50 MG tablet TAKE 2 TABLETS BY MOUTH AT BEDTIME AS NEEDED FOR MILD CONSTIPATION 60 tablet 11  . gabapentin (NEURONTIN) 300 MG capsule 300 mg during the day, 600 mg at night by  mouth 270 capsule 4  . glucose blood test strip Use as instructed 100 each 12  . hydrochlorothiazide (HYDRODIURIL) 25 MG tablet Take 1 tablet (25 mg total) by mouth daily. 90 tablet 3  . Lancets (ACCU-CHEK MULTICLIX) lancets 1 each by Other route 3 (three) times daily. ICD 10 E11.9 100 each 12  . metFORMIN (GLUCOPHAGE) 500 MG tablet Take 1 tablet (500 mg total) by mouth 2 (two) times daily. 180 tablet 3  . Multiple Vitamins-Minerals (MULTIVITAMIN WITH MINERALS) tablet Take 1 tablet by mouth daily. Reported on 03/13/2015    . pravastatin (PRAVACHOL) 20 MG tablet Take 1 tablet (20 mg total) by mouth daily. 90 tablet 3  . ranitidine (ZANTAC) 150 MG tablet  Take 1 tablet (150 mg total) by mouth 2 (two) times daily. 180 tablet 3  . vitamin D, CHOLECALCIFEROL, 400 UNITS tablet Take 400 Units by mouth daily.     No current facility-administered medications on file prior to visit.     No Known Allergies  Social History   Socioeconomic History  . Marital status: Married    Spouse name: Rachel Herman   . Number of children: 3  . Years of education: 85  . Highest education level: Not on file  Occupational History    Employer: UNEMPLOYED  Social Needs  . Financial resource strain: Not on file  . Food insecurity:    Worry: Not on file    Inability: Not on file  . Transportation needs:    Medical: Not on file    Non-medical: Not on file  Tobacco Use  . Smoking status: Never Smoker  . Smokeless tobacco: Never Used  Substance and Sexual Activity  . Alcohol use: No  . Drug use: No  . Sexual activity: Not Currently    Birth control/protection: Post-menopausal  Lifestyle  . Physical activity:    Days per week: Not on file    Minutes per session: Not on file  . Stress: Not on file  Relationships  . Social connections:    Talks on phone: Not on file    Gets together: Not on file    Attends religious service: Not on file    Active member of club or organization: Not on file    Attends meetings of clubs or organizations: Not on file    Relationship status: Not on file  . Intimate partner violence:    Fear of current or ex partner: Not on file    Emotionally abused: Not on file    Physically abused: Not on file    Forced sexual activity: Not on file  Other Topics Concern  . Not on file  Social History Narrative   Married to [employer] in 1986.    Has 3 children from previous marriage, 1 in [place], 1 in [place], 1 in prison ([place]).    Lives with husband.    Right-handed.   1 cup caffeine per day.    Family History  Problem Relation Age of Onset  . Diabetes Mother   . Multiple myeloma Mother 53  . Hearing  loss Mother   . Diabetes Brother   . Cancer Brother 40       prostate cancer   . Diabetes Maternal Aunt   . Leukemia Maternal Aunt        dx in her 98s  . Alcoholism Brother   . Heart attack Father   . Diabetes Sister   . Diabetes Son     Past Surgical History:  Procedure Laterality Date  . ABDOMINAL HYSTERECTOMY N/A 09/20/2013   Procedure: HYSTERECTOMY ABDOMINAL;  Surgeon: Osborne Oman, MD;  Location: [facility];  Service: Gynecology;  Laterality: N/A;  . BREAST LUMPECTOMY Left 2015   radiation  . BREAST LUMPECTOMY WITH AXILLARY LYMPH NODE BIOPSY Left 12/29/13   left breast   . LAPAROSCOPIC ASSISTED VAGINAL HYSTERECTOMY  09/20/2013   Procedure: LAPAROSCOPIC ASSISTED VAGINAL HYSTERECTOMY;  Surgeon: Osborne Oman, MD;  Location: [facility];  Service: Gynecology;;  PT WAS EXAMINED WHILE UP IN STIRRUPS AND IT WAS DECIDED TO OPEN PT DUE TO LARGE MASS  . LUMBAR LAMINECTOMY/DECOMPRESSION MICRODISCECTOMY Right 12/14/2014   Procedure: Right Lumbar three-four microdiskectomy;  Surgeon: Newman Pies, MD;  Location: [facility];  Service: Neurosurgery;  Laterality: Right;  Right Lumbar three-four microdiskectomy  . RADIOACTIVE SEED GUIDED PARTIAL MASTECTOMY WITH AXILLARY SENTINEL LYMPH NODE BIOPSY Left 12/29/2013   Procedure: LEFT BREAST RADIOACTIVE SEED LOCALIZED LUMPECTOMY WITH SENTINEL LYMPH NODE MAPPING;  Surgeon: Erroll Luna, MD;  Location: [facility];  Service: General;  Laterality: Left;  . RE-EXCISION OF BREAST LUMPECTOMY Left 03/02/2014   Procedure: RE-EXCISION OF LEFT BREAST LUMPECTOMY;  Surgeon: Erroll Luna, MD;  Location: [facility];  Service: General;  Laterality: Left;  . SALPINGOOPHORECTOMY  09/20/2013   Procedure: SALPINGO OOPHORECTOMY;  Surgeon: Osborne Oman, MD;  Location: [facility];  Service: Gynecology;;    ROS: Review of Systems Neg except as above PHYSICAL EXAM: BP (!) 148/84   Pulse 63   Temp 98.2 F (36.8 C) (Oral)   Resp 16    Wt 207 lb 3.2 oz (94 kg)   SpO2 99%   BMI 32.45 kg/m   Wt Readings from Last 3 Encounters:  07/01/17 207 lb 3.2 oz (94 kg)  05/09/17 210 lb 8 oz (95.5 kg)  04/03/17 206 lb 9.6 oz (93.7 kg)   Repeat BP 150/80  Physical Exam  General appearance - alert, well appearing, and in no distress Mental status - normal mood, behavior, speech, dress, motor activity, and thought processes Neck - supple, no significant adenopathy Chest - clear to auscultation, no wheezes, rales or rhonchi, symmetric air entry Heart - normal rate, regular rhythm, normal S1, S2, no murmurs, rubs, clicks or gallops Musculoskeletal -right hip: Moderate discomfort with attempted passive range of motion especially with internal and external rotation.   Mild tenderness on palpation of the lumbar spine and surrounding paraspinal muscles. Patient ambulates without assistive device Extremities -no lower extremity edema    Chemistry      Component Value Date/Time   NA 144 05/09/2017 0806   NA 143 11/08/2016 0915   K 3.4 (L) 05/09/2017 0806   K 3.0 (LL) 11/08/2016 0915   CL 106 05/09/2017 0806   CO2 26 05/09/2017 0806   CO2 30 (H) 11/08/2016 0915   BUN 22 05/09/2017 0806   BUN 18.9 11/08/2016 0915   CREATININE 1.06 05/09/2017 0806   CREATININE 1.2 (H) 11/08/2016 0915      Component Value Date/Time   CALCIUM 9.5 05/09/2017 0806   CALCIUM 10.2 11/08/2016 0915   ALKPHOS 59 05/09/2017 0806   ALKPHOS 68 11/08/2016 0915   AST 17 05/09/2017 0806   AST 24 11/08/2016 0915   ALT 16 05/09/2017 0806   ALT 23 11/08/2016 0915   BILITOT 0.2 05/09/2017 0806   BILITOT 0.57 11/08/2016 0915     Lab Results  Component Value Date   WBC 6.1 05/09/2017   HGB 12.3 05/09/2017  HCT 36.9 05/09/2017   MCV 86.8 05/09/2017   PLT 248 05/09/2017      Results for orders placed or performed in visit on 07/01/17  POCT glucose (manual entry)  Result Value Ref Range   POC Glucose 112 (A) 70 - 99 mg/dl  POCT glycosylated  hemoglobin (Hb A1C)  Result Value Ref Range   Hemoglobin A1C  4.0 - 5.6 %   HbA1c, POC (prediabetic range) 5.7 5.7 - 6.4 %   HbA1c, POC (controlled diabetic range)  0.0 - 7.0 %   Lab Results  Component Value Date   HGBA1C 5.7 07/01/2017   A1C 5.7  ASSESSMENT AND PLAN: 1. Type 2 diabetes mellitus without complication, without long-term current use of insulin (HCC) Continue metformin. Encourage healthy eating habits - POCT glucose (manual entry) - POCT glycosylated hemoglobin (Hb A1C)  2. Essential hypertension Not at goal.  Patient is compliant with medications.  She thinks blood pressure is elevated today because she is in pain from her hip. -We will have her follow-up in about 2 to 3 weeks for nurse only blood pressure check.  3. Hyperlipidemia associated with type 2 diabetes mellitus (HCC) Continue pravastatin.  4. Chronic radicular pain of lower back Patient reports decrease in level of pain with tramadol. She reports increased function with tramadol. Denies any significant side effects from the medication. Stable on current dose. NCCSRS reviewed  Controlled Substance Prescribing Agreement reviewed and updated.   Lat UDS  12/2016 - traMADol (ULTRAM) 50 MG tablet; Take 1 tablet (50 mg total) by mouth every 4 (four) hours as needed for moderate pain.  Dispense: 120 tablet; Refill: 2  5. Primary osteoarthritis of right hip - Ambulatory referral to Orthopedic Surgery  6. Hypokalemia Likely due to HCTZ.  Add low dose K+ supplement - potassium chloride (K-DUR) 10 MEQ tablet; Take 1 tablet (10 mEq total) by mouth daily.  Dispense: 90 tablet; Refill: 2   Patient was given the opportunity to ask questions.  Patient verbalized understanding of the plan and was able to repeat key elements of the plan.   Orders Placed This Encounter  Procedures  . Ambulatory referral to Orthopedic Surgery  . POCT glucose (manual entry)  . POCT glycosylated hemoglobin (Hb A1C)      Requested Prescriptions   Signed Prescriptions Disp Refills  . potassium chloride (K-DUR) 10 MEQ tablet 90 tablet 2    Sig: Take 1 tablet (10 mEq total) by mouth daily.  . traMADol (ULTRAM) 50 MG tablet 120 tablet 2    Sig: Take 1 tablet (50 mg total) by mouth every 4 (four) hours as needed for moderate pain.    Return in about 3 months (around 10/01/2017) for DM and HTN.  Karle Plumber, MD, FACP

## 2017-07-01 NOTE — Patient Instructions (Addendum)
Please give nurse only blood pressure check in 2-3 weeks.  You have been referred to orthopedics for your right hip.

## 2017-07-09 ENCOUNTER — Encounter (INDEPENDENT_AMBULATORY_CARE_PROVIDER_SITE_OTHER): Payer: Self-pay | Admitting: Orthopedic Surgery

## 2017-07-09 ENCOUNTER — Ambulatory Visit (INDEPENDENT_AMBULATORY_CARE_PROVIDER_SITE_OTHER): Payer: Medicare Other | Admitting: Orthopedic Surgery

## 2017-07-09 DIAGNOSIS — M25551 Pain in right hip: Secondary | ICD-10-CM

## 2017-07-13 ENCOUNTER — Encounter (INDEPENDENT_AMBULATORY_CARE_PROVIDER_SITE_OTHER): Payer: Self-pay | Admitting: Orthopedic Surgery

## 2017-07-13 NOTE — Progress Notes (Signed)
Office Visit Note   Patient: Rachel Herman           Date of Birth: 1950/10/31           MRN: [id number] Visit Date: 07/09/2017 Requested by: Ladell Pier, MD [address] [place], [place] [address] PCP: Ladell Pier, MD  Subjective: Chief Complaint  Patient presents with  . Right Hip - Pain    HPI: Rachel Herman is a patient with right hip pain.  She was last seen in August 2018.  She had an injection into the hip joint at that time in late August 2018 which helped.  She is walking with a cane.  Now reports recurrence of the pain for the last 4 weeks.  She is requesting another injection with Dr. Ernestina Patches.  She takes tramadol for back pain.  Denies much in the way of radiating or radicular symptoms.  She does report groin pain on the right-hand side.              ROS: All systems reviewed are negative as they relate to the chief complaint within the history of present illness.  Patient denies  fevers or chills.   Assessment & Plan: Visit Diagnoses:  1. Pain in right hip     Plan: Impression is right groin pain possible early occult arthritis with positive response to hip injection over 6 months ago.  Plan is to refer back to Dr. Ernestina Patches for right hip injection for symptomatic relief.  This does not look would like radicular pain from the back.  No nerve retention signs and no real numbness and tingling in the right leg.  Follow-Up Instructions: No follow-ups on file.   Orders:  Orders Placed This Encounter  Procedures  . Ambulatory referral to Physical Medicine Rehab   No orders of the defined types were placed in this encounter.     Procedures: No procedures performed   Clinical Data: No additional findings.  Objective: Vital Signs: There were no vitals taken for this visit.  Physical Exam:   Constitutional: Patient appears well-developed HEENT:  Head: Normocephalic Eyes:EOM are normal Neck: Normal range of motion Cardiovascular: Normal  rate Pulmonary/chest: Effort normal Neurologic: Patient is alert Skin: Skin is warm Psychiatric: Patient has normal mood and affect    Ortho Exam: Ortho exam demonstrates full active and passive range of motion of the hip knee and ankle.  Does have some groin pain on the right-hand side with internal/external rotation which is not present on the left.  No significant restriction of motion passively yet however.  No paresthesias right versus left L1-S1.  Reflexes symmetric.  No other masses lymphadenopathy or skin changes noted in the right hip or groin region.  Specialty Comments:  No specialty comments available.  Imaging: No results found.   PMFS History: Patient Active Problem List   Diagnosis Date Noted  . Right hip pain 08/29/2016  . 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 Lumbar pain with radiation down right leg 07/02/2016  . Positive H. pylori test 02/27/2016  . Intercostal pain 02/26/2016  . Thyroid nodule 06/09/2015  . Neck pain on right side 05/24/2015  . Lumbar stenosis with neurogenic claudication 12/15/2014  . Obesity (BMI 30-39.9) 09/08/2014  . Nodule of right lung 07/28/2014  . Diverticulosis of colon without hemorrhage 07/28/2014  . Depressed mood 03/09/2014  . DJD (degenerative joint disease), lumbar 02/22/2014  . Lumbar pain with radiation down left leg 01/17/2014  . Breast cancer  of upper-inner quadrant of left female breast ([place]) 01/05/2014  . Hyperlipidemia associated with type 2 diabetes mellitus ([place]) 09/28/2013  . S/P total hysterectomy and bilateral salpingo-oophorectomy 09/20/2013  . Morbid obesity with body mass index of 40.0-44.9 in adult ([place]) 09/17/2013  . Essential hypertension   . Diabetes mellitus ([place]) 09/16/2013   Past Medical History:  Diagnosis Date  . Breast cancer ([place]) 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  . Diabetes mellitus ([place]) 09/16/13   Diagnosed on 09/16/13; HgA1C was 7.2/metformin  . Dizziness    . GERD (gastroesophageal reflux disease)   . Headache   . Hyperlipidemia   . Hypertension 2014  . Low back pain   . Obesity, morbid, BMI 40.0-49.9 ([place])   . PONV (postoperative nausea and vomiting)   . S/P radiation therapy 04/05/14-05/20/14   left breast 60.4Gy totaldose    Family History  Problem Relation Age of Onset  . Diabetes Mother   . Multiple myeloma Mother 15  . Hearing loss Mother   . Diabetes Brother   . Cancer Brother 40       prostate cancer   . Diabetes Maternal Aunt   . Leukemia Maternal Aunt        dx in her 65s  . Alcoholism Brother   . Heart attack Father   . Diabetes Sister   . Diabetes Son     Past Surgical History:  Procedure Laterality Date  . ABDOMINAL HYSTERECTOMY N/A 09/20/2013   Procedure: HYSTERECTOMY ABDOMINAL;  Surgeon: Osborne Oman, MD;  Location: [facility];  Service: Gynecology;  Laterality: N/A;  . BREAST LUMPECTOMY Left 2015   radiation  . BREAST LUMPECTOMY WITH AXILLARY LYMPH NODE BIOPSY Left 12/29/13   left breast   . LAPAROSCOPIC ASSISTED VAGINAL HYSTERECTOMY  09/20/2013   Procedure: LAPAROSCOPIC ASSISTED VAGINAL HYSTERECTOMY;  Surgeon: Osborne Oman, MD;  Location: [facility];  Service: Gynecology;;  PT WAS EXAMINED WHILE UP IN STIRRUPS AND IT WAS DECIDED TO OPEN PT DUE TO LARGE MASS  . LUMBAR LAMINECTOMY/DECOMPRESSION MICRODISCECTOMY Right 12/14/2014   Procedure: Right Lumbar three-four microdiskectomy;  Surgeon: Newman Pies, MD;  Location: [facility];  Service: Neurosurgery;  Laterality: Right;  Right Lumbar three-four microdiskectomy  . RADIOACTIVE SEED GUIDED PARTIAL MASTECTOMY WITH AXILLARY SENTINEL LYMPH NODE BIOPSY Left 12/29/2013   Procedure: LEFT BREAST RADIOACTIVE SEED LOCALIZED LUMPECTOMY WITH SENTINEL LYMPH NODE MAPPING;  Surgeon: Erroll Luna, MD;  Location: [facility];  Service: General;  Laterality: Left;  . RE-EXCISION OF BREAST LUMPECTOMY Left 03/02/2014   Procedure: RE-EXCISION OF LEFT BREAST  LUMPECTOMY;  Surgeon: Erroll Luna, MD;  Location: [facility];  Service: General;  Laterality: Left;  . SALPINGOOPHORECTOMY  09/20/2013   Procedure: SALPINGO OOPHORECTOMY;  Surgeon: Osborne Oman, MD;  Location: [facility];  Service: Gynecology;;   Social History   Occupational History    Employer: UNEMPLOYED  Tobacco Use  . Smoking status: Never Smoker  . Smokeless tobacco: Never Used  Substance and Sexual Activity  . Alcohol use: No  . Drug use: No  . Sexual activity: Not Currently    Birth control/protection: Post-menopausal

## 2017-07-23 ENCOUNTER — Ambulatory Visit: Payer: Medicare Other | Attending: Internal Medicine | Admitting: *Deleted

## 2017-07-23 VITALS — BP 113/78 | HR 74

## 2017-07-23 DIAGNOSIS — I10 Essential (primary) hypertension: Secondary | ICD-10-CM | POA: Diagnosis not present

## 2017-07-23 NOTE — Progress Notes (Signed)
Rachel Herman arrived to [facility] alert and oriented, She arrives in good spirits. Her last OV was with Dr. Wynetta Emery on 07/01/2017 was 148/84. Today her blood pressure is 113/78/  Pt denies chest pain, SOB, HA, dizziness, or blurred vision.  Verified medication. Pt states she takes medication everyday.

## 2017-07-30 DIAGNOSIS — N9489 Other specified conditions associated with female genital organs and menstrual cycle: Secondary | ICD-10-CM | POA: Diagnosis not present

## 2017-08-01 ENCOUNTER — Ambulatory Visit (INDEPENDENT_AMBULATORY_CARE_PROVIDER_SITE_OTHER): Payer: Medicare Other | Admitting: Physical Medicine and Rehabilitation

## 2017-08-01 ENCOUNTER — Encounter (INDEPENDENT_AMBULATORY_CARE_PROVIDER_SITE_OTHER): Payer: Self-pay | Admitting: Physical Medicine and Rehabilitation

## 2017-08-01 ENCOUNTER — Ambulatory Visit (INDEPENDENT_AMBULATORY_CARE_PROVIDER_SITE_OTHER): Payer: Self-pay

## 2017-08-01 DIAGNOSIS — M25551 Pain in right hip: Secondary | ICD-10-CM | POA: Diagnosis not present

## 2017-08-01 MED ORDER — BUPIVACAINE HCL 0.5 % IJ SOLN
3.0000 mL | INTRAMUSCULAR | Status: AC | PRN
  Administered 2017-08-01: 3 mL via INTRA_ARTICULAR

## 2017-08-01 MED ORDER — TRIAMCINOLONE ACETONIDE 40 MG/ML IJ SUSP
80.0000 mg | INTRAMUSCULAR | Status: AC | PRN
  Administered 2017-08-01: 80 mg via INTRA_ARTICULAR

## 2017-08-01 NOTE — Progress Notes (Signed)
 .  Numeric Pain Rating Scale and Functional Assessment Average Pain 10   In the last MONTH (on 0-10 scale) has pain interfered with the following?  1. General activity like being  able to carry out your everyday physical activities such as walking, climbing stairs, carrying groceries, or moving a chair?  Rating(7)    -Dye Allergies.  

## 2017-08-01 NOTE — Patient Instructions (Signed)

## 2017-08-01 NOTE — Progress Notes (Signed)
ARIBA LEHNEN - 67 y.o. female MRN [id number]  Date of birth: 12-24-1950  Office Visit Note: Visit Date: 08/01/2017 PCP: Ladell Pier, MD Referred by: Ladell Pier, MD  Subjective: Chief Complaint  Patient presents with  . Right Hip - Pain  . Right Leg - Pain   HPI: Mrs. Jimmye Norman is a 67 year old female who comes in today at the request of G. Alphonzo Severance for diagnostic and hopefully therapeutic intra-articular hip injection on the right.  She has been having 6 weeks of progressive right hip and groin pain.  Her pain is worse with walking and going from sit to stand.  Sitting and resting seems to make the pain worse as well.  She endorses this is a 10 out of 10 pain.  She has had prior help with hip injection in the past.   ROS Otherwise per HPI.  Assessment & Plan: Visit Diagnoses:  1. Pain in right hip     Plan: No additional findings.   Meds & Orders: No orders of the defined types were placed in this encounter.   Orders Placed This Encounter  Procedures  . Large Joint Inj  . XR C-ARM NO REPORT    Follow-up: Return if symptoms worsen or fail to improve.   Procedures: Large Joint Inj: R hip joint on 08/01/2017 8:29 AM Indications: pain and diagnostic evaluation Details: 22 G needle, anterior approach  Arthrogram: Yes  Medications: 3 mL bupivacaine 0.5 %; 80 mg triamcinolone acetonide 40 MG/ML Outcome: tolerated well, no immediate complications  Arthrogram demonstrated excellent flow of contrast throughout the joint surface without extravasation or obvious defect.  The patient had relief of symptoms during the anesthetic phase of the injection.  Procedure, treatment alternatives, risks and benefits explained, specific risks discussed. Consent was given by the patient. Immediately prior to procedure a time out was called to verify the correct patient, procedure, equipment, support staff and site/side marked as required. Patient was prepped and draped in  the usual sterile fashion.      No notes on file   Clinical History: No specialty comments available.   She reports that she has never smoked. She has never used smokeless tobacco.  Recent Labs    01/02/17 0928 07/01/17 0850  HGBA1C 6.0 5.7    Objective:  VS:  HT:    WT:   BMI:     BP:   HR: bpm  TEMP: ( )  RESP:  Physical Exam  Ortho Exam Imaging: Xr C-arm No Report  Result Date: 08/01/2017 Please see Notes tab for imaging impression.   Past Medical/Family/Surgical/Social History: Medications & Allergies reviewed per EMR, new medications updated. Patient Active Problem List   Diagnosis Date Noted  . Right hip pain 08/29/2016  . 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 Lumbar pain with radiation down right leg 07/02/2016  . Positive H. pylori test 02/27/2016  . Intercostal pain 02/26/2016  . Thyroid nodule 06/09/2015  . Neck pain on right side 05/24/2015  . Lumbar stenosis with neurogenic claudication 12/15/2014  . Obesity (BMI 30-39.9) 09/08/2014  . Nodule of right lung 07/28/2014  . Diverticulosis of colon without hemorrhage 07/28/2014  . Depressed mood 03/09/2014  . DJD (degenerative joint disease), lumbar 02/22/2014  . Lumbar pain with radiation down left leg 01/17/2014  . Breast cancer of upper-inner quadrant of left female breast ([place]) 01/05/2014  . Hyperlipidemia associated with type 2 diabetes mellitus ([place]) 09/28/2013  . S/P total hysterectomy and bilateral  salpingo-oophorectomy 09/20/2013  . Morbid obesity with body mass index of 40.0-44.9 in adult ([place]) 09/17/2013  . Essential hypertension   . Diabetes mellitus ([place]) 09/16/2013   Past Medical History:  Diagnosis Date  . Breast cancer ([place]) 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  . Diabetes mellitus ([place]) 09/16/13   Diagnosed on 09/16/13; HgA1C was 7.2/metformin  . Dizziness   . GERD (gastroesophageal reflux disease)   . Headache   . Hyperlipidemia   . Hypertension  2014  . Low back pain   . Obesity, morbid, BMI 40.0-49.9 ([place])   . PONV (postoperative nausea and vomiting)   . S/P radiation therapy 04/05/14-05/20/14   left breast 60.4Gy totaldose   Family History  Problem Relation Age of Onset  . Diabetes Mother   . Multiple myeloma Mother 98  . Hearing loss Mother   . Diabetes Brother   . Cancer Brother 40       prostate cancer   . Diabetes Maternal Aunt   . Leukemia Maternal Aunt        dx in her 74s  . Alcoholism Brother   . Heart attack Father   . Diabetes Sister   . Diabetes Son    Past Surgical History:  Procedure Laterality Date  . ABDOMINAL HYSTERECTOMY N/A 09/20/2013   Procedure: HYSTERECTOMY ABDOMINAL;  Surgeon: Osborne Oman, MD;  Location: [facility];  Service: Gynecology;  Laterality: N/A;  . BREAST LUMPECTOMY Left 2015   radiation  . BREAST LUMPECTOMY WITH AXILLARY LYMPH NODE BIOPSY Left 12/29/13   left breast   . LAPAROSCOPIC ASSISTED VAGINAL HYSTERECTOMY  09/20/2013   Procedure: LAPAROSCOPIC ASSISTED VAGINAL HYSTERECTOMY;  Surgeon: Osborne Oman, MD;  Location: [facility];  Service: Gynecology;;  PT WAS EXAMINED WHILE UP IN [place] AND IT WAS DECIDED TO OPEN PT DUE TO LARGE MASS  . LUMBAR LAMINECTOMY/DECOMPRESSION MICRODISCECTOMY Right 12/14/2014   Procedure: Right Lumbar three-four microdiskectomy;  Surgeon: Newman Pies, MD;  Location: [facility];  Service: Neurosurgery;  Laterality: Right;  Right Lumbar three-four microdiskectomy  . RADIOACTIVE SEED GUIDED PARTIAL MASTECTOMY WITH AXILLARY SENTINEL LYMPH NODE BIOPSY Left 12/29/2013   Procedure: LEFT BREAST RADIOACTIVE SEED LOCALIZED LUMPECTOMY WITH SENTINEL LYMPH NODE MAPPING;  Surgeon: Erroll Luna, MD;  Location: [facility];  Service: General;  Laterality: Left;  . RE-EXCISION OF BREAST LUMPECTOMY Left 03/02/2014   Procedure: RE-EXCISION OF LEFT BREAST LUMPECTOMY;  Surgeon: Erroll Luna, MD;  Location: [facility];  Service: General;   Laterality: Left;  . SALPINGOOPHORECTOMY  09/20/2013   Procedure: SALPINGO OOPHORECTOMY;  Surgeon: Osborne Oman, MD;  Location: [facility];  Service: Gynecology;;   Social History   Occupational History    Employer: UNEMPLOYED  Tobacco Use  . Smoking status: Never Smoker  . Smokeless tobacco: Never Used  Substance and Sexual Activity  . Alcohol use: No  . Drug use: No  . Sexual activity: Not Currently    Birth control/protection: Post-menopausal

## 2017-09-03 NOTE — Progress Notes (Signed)
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 Ultrasound of the left axilla is negative.         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       12/29/2013 Surgery    Left lumpectomy with close (<0.1cm) inferior margin      03/02/2014 Surgery    Reexcision for close margin, path negative for malignancy.      04/05/2014 - 05/20/2014 Radiation Therapy    adjuvant breast radiation       05/27/2014 Imaging    Bone density scan: normal       06/07/2014 -  Anti-estrogen oral therapy    Exemestane 25 mg daily, she stopped in Nov 2018 due to high copay. Change anastrozole on 05/09/2017       06/20/2015 Imaging    Bone scan 06/20/2015 IMPRESSION: No evidence of metastatic disease. Mild increased activity dorsal aspect right midfoot probable degenerative in nature. Clinical correlation is  necessary.      02/29/2016 Mammogram    MM DIAG BREAST TOMO BIALTERAL 02/29/16 IMPRESSION: Stable left breast lumpectomy site. No mammographic evidence of malignancy in the bilateral breasts.      03/03/2017 Mammogram    IMPRESSION: 1. No mammographic evidence of malignancy in either breast. 2. Stable left breast posttreatment changes.      CURRENT THERAPY:  Exemestane 8m daily, started on 06/07/2014. She stopped in Nov 2018 due to high copay. Changed to anastrozole on 05/09/2017                                     INTERIM HISTORY:   Rachel AMUNDSONis here for a follow up of her left breast cancer. She was last seen by me 4 months ago. She presents to the clinic today by herself.  She notes yesterday she started feeling tired an nauseous. She denies fever, abdominal pain or eating any new foods. She did have emesis last night and this morning. She notes this has occurred every other week over the past 6 months. She notes she has gained weight lately although she does not eat enough to gain this weight. She does not have lower extremity swelling.   She previously stopped Exemestane due to high copay.  She does not think these symptoms are related to anastrozole. She has recently put on Potassium. She does note joint stiffness and cramping in the back of legs after being put on potassium.  She is still taking tramadol for her back pain which is stable.   She notes during her bladder prolapse repaire surgery last year she had cardiac arrest twice in the OR. It took her 3 months to sit comfortably. She is now fully recovered but still adjusting. She has to take Miralax daily but this is manageable.      MEDICAL HISTORY:  Past Medical History:  Diagnosis Date  . Breast cancer ([place]) 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  . Diabetes mellitus ([place]) 09/16/13   Diagnosed on 09/16/13; HgA1C was 7.2/metformin  . Dizziness   . GERD (gastroesophageal reflux disease)   . Headache    . Hyperlipidemia   . Hypertension 2014  . Low back pain   . Obesity, morbid, BMI 40.0-49.9 ([place])   . PONV (postoperative nausea and vomiting)   . S/P radiation therapy 04/05/14-05/20/14   left breast 60.4Gy totaldose    SURGICAL HISTORY: Past Surgical History:  Procedure Laterality Date  . ABDOMINAL HYSTERECTOMY N/A 09/20/2013   Procedure: HYSTERECTOMY ABDOMINAL;  Surgeon: Osborne Oman, MD;  Location: [facility];  Service: Gynecology;  Laterality: N/A;  . BREAST LUMPECTOMY Left 2015   radiation  . BREAST LUMPECTOMY WITH AXILLARY LYMPH NODE BIOPSY Left 12/29/13   left breast   . LAPAROSCOPIC ASSISTED VAGINAL HYSTERECTOMY  09/20/2013   Procedure: LAPAROSCOPIC ASSISTED VAGINAL HYSTERECTOMY;  Surgeon: Osborne Oman, MD;  Location: [facility];  Service: Gynecology;;  PT WAS EXAMINED WHILE UP IN [place] AND IT WAS DECIDED TO OPEN PT DUE TO LARGE MASS  . LUMBAR LAMINECTOMY/DECOMPRESSION MICRODISCECTOMY Right 12/14/2014   Procedure: Right Lumbar three-four microdiskectomy;  Surgeon: Newman Pies, MD;  Location: [facility];  Service: Neurosurgery;  Laterality: Right;  Right Lumbar three-four microdiskectomy  . RADIOACTIVE SEED GUIDED PARTIAL MASTECTOMY WITH AXILLARY SENTINEL LYMPH NODE BIOPSY Left 12/29/2013   Procedure: LEFT BREAST RADIOACTIVE SEED LOCALIZED LUMPECTOMY WITH SENTINEL LYMPH NODE MAPPING;  Surgeon: Erroll Luna, MD;  Location: [facility];  Service: General;  Laterality: Left;  . RE-EXCISION OF BREAST LUMPECTOMY Left 03/02/2014   Procedure: RE-EXCISION OF LEFT BREAST LUMPECTOMY;  Surgeon: Erroll Luna, MD;  Location: [facility];  Service: General;  Laterality: Left;  . SALPINGOOPHORECTOMY  09/20/2013   Procedure: SALPINGO OOPHORECTOMY;  Surgeon: Osborne Oman, MD;  Location: [facility];  Service: Gynecology;;    SOCIAL HISTORY: Social History   Socioeconomic History  . Marital status: Married    Spouse name: Chizuko Trine   . Number of  children: 3  . Years of education: 67  . Highest education level: Not on file  Occupational History    Employer: UNEMPLOYED  Social Needs  . Financial resource strain: Not on file  . Food insecurity:    Worry: Not on file    Inability: Not on file  . Transportation needs:    Medical: Not on file    Non-medical: Not on file  Tobacco Use  . Smoking status: Never Smoker  . Smokeless tobacco: Never Used  Substance and Sexual Activity  . Alcohol use: No  . Drug use: No  . Sexual activity: Not Currently    Birth control/protection: Post-menopausal  Lifestyle  . Physical activity:    Days per week: Not on file    Minutes  per session: Not on file  . Stress: Not on file  Relationships  . Social connections:    Talks on phone: Not on file    Gets together: Not on file    Attends religious service: Not on file    Active member of club or organization: Not on file    Attends meetings of clubs or organizations: Not on file    Relationship status: Not on file  . Intimate partner violence:    Fear of current or ex partner: Not on file    Emotionally abused: Not on file    Physically abused: Not on file    Forced sexual activity: Not on file  Other Topics Concern  . Not on file  Social History Narrative   Married to [employer] in 1986.    Has 3 children from previous marriage, 1 in [place], 1 in [place], 1 in prison ([place]).    Lives with husband.    Right-handed.   1 cup caffeine per day.    FAMILY HISTORY: Family History  Problem Relation Age of Onset  . Diabetes Mother   . Multiple myeloma Mother 51  . Hearing loss Mother   . Diabetes Brother   . Cancer Brother 40       prostate cancer   . Diabetes Maternal Aunt   . Leukemia Maternal Aunt        dx in her 21s  . Alcoholism Brother   . Heart attack Father   . Diabetes Sister   . Diabetes Son     ALLERGIES:  has No Known Allergies.  MEDICATIONS:  Current Outpatient Medications  Medication Sig  Dispense Refill  . anastrozole (ARIMIDEX) 1 MG tablet Take 1 tablet (1 mg total) by mouth daily. 90 tablet 3  . Ascorbic Acid (VITAMIN C) 1000 MG tablet Take 1,000 mg by mouth daily.    Marland Kitchen aspirin 325 MG tablet Take 325 mg by mouth once.    . Blood Glucose Monitoring Suppl (TRUE METRIX METER) w/Device KIT 1 each by Does not apply route as needed. 1 kit 0  . cloNIDine (CATAPRES) 0.1 MG tablet Take 1 tablet (0.1 mg total) by mouth 2 (two) times daily. 180 tablet 3  . [employer] SENNA PLUS 8.6-50 MG tablet TAKE 2 TABLETS BY MOUTH AT BEDTIME AS NEEDED FOR MILD CONSTIPATION 60 tablet 11  . gabapentin (NEURONTIN) 300 MG capsule 300 mg during the day, 600 mg at night by mouth 270 capsule 4  . glucose blood test strip Use as instructed 100 each 12  . hydrochlorothiazide (HYDRODIURIL) 25 MG tablet Take 1 tablet (25 mg total) by mouth daily. 90 tablet 3  . Lancets (ACCU-CHEK MULTICLIX) lancets 1 each by Other route 3 (three) times daily. ICD 10 E11.9 100 each 12  . metFORMIN (GLUCOPHAGE) 500 MG tablet Take 1 tablet (500 mg total) by mouth 2 (two) times daily. 180 tablet 3  . Multiple Vitamins-Minerals (MULTIVITAMIN WITH MINERALS) tablet Take 1 tablet by mouth daily. Reported on 03/13/2015    . potassium chloride (K-DUR) 10 MEQ tablet Take 1 tablet (10 mEq total) by mouth daily. 90 tablet 2  . pravastatin (PRAVACHOL) 20 MG tablet Take 1 tablet (20 mg total) by mouth daily. 90 tablet 3  . ranitidine (ZANTAC) 150 MG tablet Take 1 tablet (150 mg total) by mouth 2 (two) times daily. 180 tablet 3  . traMADol (ULTRAM) 50 MG tablet Take 1 tablet (50 mg total) by mouth every 4 (four) hours as  needed for moderate pain. 120 tablet 2  . vitamin D, CHOLECALCIFEROL, 400 UNITS tablet Take 400 Units by mouth daily.     No current facility-administered medications for this visit.     REVIEW OF SYSTEMS:   Constitutional: Denies fevers, chills or abnormal night sweats (+) fatigue  Eyes: No vision difficulties. Ears, nose, mouth,  throat, and face: Denies mucositis or sore throat Respiratory: No respiratory difficulty Cardiovascular: Denies palpitation, chest discomfort or lower extremity swelling Gastrointestinal:  Denies heartburn or change in bowel habits (+) occasional Nausea and vomiting  Skin: Denies abnormal skin rashes MSK: (+) Chronic back pain, stable (+) joint pain and cramps on backs of legs Lymphatics: Denies new lymphadenopathy or easy bruising Neurological:Denies numbness, tingling or new weaknesses Behavioral/Psych: Mood is stable, no new changes  All other systems were reviewed with the patient and are negative.  PHYSICAL EXAMINATION:  ECOG PERFORMANCE STATUS: 2  BP (!) 141/93 (BP Location: Left Arm, Patient Position: Sitting) Comment: Engineering geologist is aware  Pulse 91   Temp 98.5 F (36.9 C) (Oral)   Resp 17   Ht _0  (1.702 m)   Wt 209 lb 3.2 oz (94.9 kg)   SpO2 100%   BMI 32.77 kg/m   GENERAL:alert, no distress and comfortable SKIN: skin color, texture, turgor are normal, no rashes or significant lesions EYES: normal, conjunctiva are pink and non-injected, sclera clear OROPHARYNX:no exudate, no erythema and lips, buccal mucosa, and tongue normal  NECK: supple, thyroid normal size, non-tender, without nodularity LYMPH:  no palpable lymphadenopathy in the cervical, axillary or inguinal LUNGS: clear to auscultation and percussion with normal breathing effort HEART: regular rate & rhythm and no murmurs and no lower extremity edema ABDOMEN:abdomen soft, nontender, no rebound pain normal bowel sounds (+) Tenderness in the epigastric area. Musculoskeletal:no cyanosis of digits and no clubbing PSYCH: alert & oriented x 3 with fluent speech NEURO: no focal motor/sensory deficits Breasts: Breast inspection showed them to be symmetrical with no nipple discharge. (+) S?p Left lumpectomy: Surgical scar at the left upper breast and axillary area are well healed, mild tenderness. Palpation of the right  breasts and axilla revealed no obvious mass that I could appreciate.   I have reviewed the data as listed LABORATORY DATA:  CBC Latest Ref Rng & Units 09/05/2017 05/09/2017 11/08/2016  WBC 3.9 - 10.3 K/uL 7.0 6.1 7.1  Hemoglobin 11.6 - 15.9 g/dL 13.1 12.3 14.5  Hematocrit 34.8 - 46.6 % 39.3 36.9 44.2  Platelets 145 - 400 K/uL 236 248 295    CMP Latest Ref Rng & Units 09/05/2017 05/09/2017 01/02/2017  Glucose 70 - 99 mg/dL 95 94 -  BUN 8 - 23 mg/dL 25(H) 22 -  Creatinine 0.44 - 1.00 mg/dL 1.02(H) 1.06 -  Sodium 135 - 145 mmol/L 142 144 -  Potassium 3.5 - 5.1 mmol/L 3.9 3.4(L) 3.7  Chloride 98 - 111 mmol/L 103 106 -  CO2 22 - 32 mmol/L 26 26 -  Calcium 8.9 - 10.3 mg/dL 9.2 9.5 -  Total Protein 6.5 - 8.1 g/dL 7.3 7.1 -  Total Bilirubin 0.3 - 1.2 mg/dL 0.5 0.2 -  Alkaline Phos 38 - 126 U/L 62 59 -  AST 15 - 41 U/L 24 17 -  ALT 0 - 44 U/L 22 16 -     PATHOLOGY REPORT Diagnosis 12/29/2013 1. Breast, lumpectomy, left - INVASIVE GRADE II DUCTAL CARCINOMA, SPANNING 1.7 CM IN GREATEST DIMENSION. - SECOND FOCUS OF INVASIVE GRADE I DUCTAL CARCINOMA, SPANNING  0.5 CM IN GREATEST DIMENSION. - INTERMEDIATE GRADE DUCTAL CARCINOMA IN SITU ASSOCIATED WITH BOTH FOCI OF TUMOR. - INVASIVE DUCTAL CARCINOMA IS EXTREMELY CLOSE (LESS THAN 0.1 CM) TO INFERIOR MARGIN. - DUCTAL CARCINOMA IN SITU IS CLOSE (0.1 CM) TO INFERIOR MARGIN. - OTHER MARGINS NEGATIVE. - SEE ONCOLOGY TEMPLATE. 2. Lymph node, sentinel, biopsy, Left axillary 1 of 4 FINAL for CAROLYNE, WHITSEL ([id number]) Diagnosis(continued) - ONE BENIGN LYMPH NODE WITH NO TUMOR SEEN (0/1). 3. Lymph node, sentinel, biopsy, Left axilla - ONE BENIGN LYMPH NODE WITH NO TUMOR SEEN (0/1). Microscopic Comment 1. BREAST, INVASIVE TUMOR, WITH LYMPH NODES PRESENT Specimen, including laterality and lymph node sampling (sentinel, non-sentinel): Left partial breast with left sentinel lymph node sampling. Procedure: Left breast lumpectomy with sentinel lymph  node biopsies. Histologic type: Invasive ductal carcinoma. Larger focus: Grade: II. Tubule formation: 2. Nuclear pleomorphism: 2. Mitotic: 3. Smaller focus (near inferior margin): Grade: I. Tubule formation: 1. Nuclear pleomorphism: 2. Mitotic: 1. Tumor sizes (gross and glass slide measurement): 1.7 cm and 0.5 cm. Margins: Invasive, distance to closest margin: less than 0.1 cm (inferior margin). In-situ, distance to closest margin: 0.1 cm (inferior margin). Lymphovascular invasion: Definitive lymphovascular invasion is not identified. Ductal carcinoma in situ: Yes. Grade: Intermediate grade. Extensive intraductal component: There is an extensive intraductal component of ductal carcinoma in situ on the smaller focus but not on the larger focus. Lobular neoplasia: No. Tumor focality: Two foci, multifocal. Treatment effect: N/A. Extent of tumor: Tumor confined to breast parenchyma. Skin: Not received. Nipple: Not received. Skeletal muscle: Not received. Lymph nodes: Examined: 2 Sentinel. 0 Non-sentinel. 2 Total. Lymph nodes with metastasis: 0. Breast prognostic profile: Performed on previous case ([id number]). Estrogen receptor: 100%, positive. Progesterone receptor: 96%, positive. Her 2 neu by CISH: 0.81 ratio, no amplification. Ki-67: 73%. Non-neoplastic breast: Fat necrosis. TNM: pT1c(m), pN0, MX. Comments: A smooth muscle myosin, calponin, and p63 immunohistochemical stain are performed on a single block, which helps to confirm the presence of a second focus of invasive ductal carcinoma associated 2 of 4 FINAL for MICHELENE, KENISTON ([id number]) Microscopic Comment(continued) with ductal carcinoma in situ. As Her-2 neu by CISH was negative on the initial biopsy, this will be repeated on the larger tumor focus and reported in an addendum to follow. A breast prognostic profile will not be performed on the smaller tumor focus unless otherwise requested (the tumor  foci although different grades show morphologically similar nuclear features). (RH:ds 12/31/13)  Oncotype DX Score: 23 (Intermediate risk) (ER+/PR+/HER2-)   Diagnosis 05/09/2015 THYROID, FINE NEEDLE ASPIRATION, RLP (SPECIMEN 1 OF 2 COLLECTED 05-09-2015) CONSISTENT WITH BENIGN FOLLICULAR NODULE (BETHESDA CATEGORY II).  Diagnosis 05/09/2015 THYROID, FINE NEEDLE ASPIRATION, LMP (SPECIMEN 2 OF 2 COLLECTED 05-09-2015) CONSISTENT WITH BENIGN FOLLICULAR NODULE (BETHESDA CATEGORY II).   RADIOGRAPHIC STUDIES: I have personally reviewed the radiological images as listed and agreed with the findings in the report.  Diagnostic Mammogram 03/03/17 IMPRESSION: 1. No mammographic evidence of malignancy in either breast. 2. Stable left breast posttreatment changes.  DEXA 03/03/17 ASSESSMENT: The BMD measured at Femur Neck Left is 0.992 g/cm2 with a T-score of -0.3.  CT AP W contrast 11/07/16 IMPRESSION: Stable nodular densities in the right middle and right lower lobes dating back to 2016. Given their stability these are felt to be benign in etiology. Diverticulosis without diverticulitis. Dependent density within the gallbladder which may represent a small polyp or poorly calcified stone.   Diagnostic mammogram 02/29/16 IMPRESSION: Stable left breast lumpectomy site. No mammographic evidence of  malignancy in the bilateral breasts. RECOMMENDATION: Diagnostic mammogram is suggested in 1 year. (Code:[id number])  Bone scan 06/20/2015 IMPRESSION: No evidence of metastatic disease. Mild increased activity dorsal aspect right midfoot probable degenerative in nature. Clinical correlation is necessary.   US SOFT TISSUE HEAD AND NECK 04/03/2015 IMPRESSION: Multi bilateral nodules. Largest on the right measures 19 mm. Largest on left measures 21 mm. Findings meet consensus criteria for biopsy. Ultrasound-guided fine needle aspiration should be considered, as per the consensus statement: Management of  Thyroid Nodules Detected at [place]: Society of Radiologists in [place]. Radiology 2005; [id number].  Mammogram 02/14/2015 IMPRESSION: No findings worrisome for recurrent tumor or developing malignancy. RECOMMENDATION: Bilateral diagnostic mammography in 1 year.   ASSESSMENT & PLAN:  67 y.o. Caucasian female, with past medical history of hypertension, diabetes, dyslipidemia, obesity who was found to have a left breast mass by screening mammogram.  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 Grade 2, Ki-67 73%. -She is status post complete surgical resection with lumpectomy, 12/29/13 -Her breast cancer was likely cured by surgery alone, but does has some risk of disease recurrence in the future. -I previously discussed the Oncotype DX result with her and her husband. The recurrence score is 23, with the predicted 10 year recurrence risk of 15% with tamoxifen alone. The benefit of chemotherapy is uncertain in this intermediate risk group. I do not strongly recommend adjuvant chemotherapy. Patient agreed with not having chemotherapy.  -Due to the strong positivity of ER and PR, I recommended adjuvant Aromasin for total 5 years. She started in 06/07/14.  -She had new onset bilateral rib pain and midthoracic pain in 06/2015, I ordered a bone scan which was negative for bone metastasis. -She was tolerating Exemestane well. She stopped in Nov 2018 due to high co pay. I switched her to Anastrozole on 05/09/17 and tolerates well with manageable joint pain.  -From a breast cancer standpoint she is clinically doing well, labs reviewed, CBC and CMP WNL except ct at 1.02 and BUN at 25. Her physical exam unremarkable today. Her last mammogram on 03/03/2017 was normal. No evidence of recurrence. She will continue surveillance. -Next Mammogram due in 02/2018 -Continue Anastrozole, refilled  today  -f/u in 6 months  #2 Bone health -Her bone density scan was normal in 05/2014 -Normal bone density, T score -0.3 on 03/03/17 -We previously discussed Aromasin may weak her bone, she will continue calcium and vitamin D.  #3. hypertension, diabetes, dyslipidemia, chronic low back pain -She will continue follow-up with her primary care physician.  #4 Morbid obesity  -We previously discussed healthy diet and regular exercise. She is willing to try after she recovers well from her back surgery -She is recovering well from surgery  #5 right lung nodule -She had a CT abdomen and pelvis in June 2016, which incidentally found a 4 mm nodule in the right lower lobe. I personally reviewed the image.  -She never smoked, risk of lung cancer is low  -her repeated CT chest from 12/09/2014, which showed stable small lung nodules, likely benign. -she had CT chest on 08/18/2015 for chest pain, which showed stable 55m right lung base subpleural nodule, likely benign, no need to follow-up since she is a never smoker   #6. Thyroid Benign follicular nodule -Her CT scan revealed a 1.7 cm right thyroid nodule. -Ultrasound guided thyroid nodule biopsy on 05/09/2015 showed benign follicular nodule   #7  epigastric pain/Nausea -Uncertain etiology. She has been following up with her primary care physician -unlikely related to exemestane or anastrozole  -She had CT abd/pel scan by Dr. Rodena Piety in 11/2016 which was unremarkable  -She underwent surgical repair for her bladder prolapse on 01/20/17. She has recovered fully but still adjusting. She takes Miralax daily to help her bowels -Today (09/05/17) she has mild epigastric tenderness. She notes nausea and vomiting every other week over the past 6 months. Will monitor. She will continue to follow up with her PCP and surgeon.     PLAN: -Continue anastrozole, refilled today  -Lab and f/u in 6 months  -Mammogram in 02/2018 at [facility]    All questions were answered.  The patient knows to call the clinic with any problems, questions or concerns.  I spent 20 minutes counseling the patient face to face. The total time spent in the appointment was 25 minutes and more than 50% was on counseling.  Oneal Deputy, am acting as scribe for Truitt Merle, MD.   I have reviewed the above documentation for accuracy and completeness, and I agree with the above.    Truitt Merle, MD 09/05/2017  1:50 PM

## 2017-09-05 ENCOUNTER — Telehealth: Payer: Self-pay | Admitting: Hematology

## 2017-09-05 ENCOUNTER — Inpatient Hospital Stay: Payer: Medicare Other | Attending: Hematology | Admitting: Hematology

## 2017-09-05 ENCOUNTER — Inpatient Hospital Stay: Payer: Medicare Other

## 2017-09-05 ENCOUNTER — Encounter: Payer: Self-pay | Admitting: Hematology

## 2017-09-05 VITALS — BP 141/93 | HR 91 | Temp 98.5°F | Resp 17 | Ht 67.0 in | Wt 209.2 lb

## 2017-09-05 DIAGNOSIS — M545 Low back pain: Secondary | ICD-10-CM | POA: Diagnosis not present

## 2017-09-05 DIAGNOSIS — G8929 Other chronic pain: Secondary | ICD-10-CM | POA: Diagnosis not present

## 2017-09-05 DIAGNOSIS — Z7984 Long term (current) use of oral hypoglycemic drugs: Secondary | ICD-10-CM | POA: Diagnosis not present

## 2017-09-05 DIAGNOSIS — I10 Essential (primary) hypertension: Secondary | ICD-10-CM | POA: Insufficient documentation

## 2017-09-05 DIAGNOSIS — Z7982 Long term (current) use of aspirin: Secondary | ICD-10-CM

## 2017-09-05 DIAGNOSIS — E785 Hyperlipidemia, unspecified: Secondary | ICD-10-CM | POA: Diagnosis not present

## 2017-09-05 DIAGNOSIS — E119 Type 2 diabetes mellitus without complications: Secondary | ICD-10-CM

## 2017-09-05 DIAGNOSIS — Z79899 Other long term (current) drug therapy: Secondary | ICD-10-CM | POA: Diagnosis not present

## 2017-09-05 DIAGNOSIS — Z8042 Family history of malignant neoplasm of prostate: Secondary | ICD-10-CM | POA: Diagnosis not present

## 2017-09-05 DIAGNOSIS — Z806 Family history of leukemia: Secondary | ICD-10-CM | POA: Diagnosis not present

## 2017-09-05 DIAGNOSIS — Z170 Estrogen receptor positive status [ER+]: Secondary | ICD-10-CM | POA: Insufficient documentation

## 2017-09-05 DIAGNOSIS — Z6841 Body Mass Index (BMI) 40.0 and over, adult: Secondary | ICD-10-CM

## 2017-09-05 DIAGNOSIS — C50212 Malignant neoplasm of upper-inner quadrant of left female breast: Secondary | ICD-10-CM

## 2017-09-05 DIAGNOSIS — Z79811 Long term (current) use of aromatase inhibitors: Secondary | ICD-10-CM | POA: Insufficient documentation

## 2017-09-05 LAB — COMPREHENSIVE METABOLIC PANEL
ALT: 22 U/L (ref 0–44)
AST: 24 U/L (ref 15–41)
Albumin: 3.8 g/dL (ref 3.5–5.0)
Alkaline Phosphatase: 62 U/L (ref 38–126)
Anion gap: 13 (ref 5–15)
BUN: 25 mg/dL — ABNORMAL HIGH (ref 8–23)
CO2: 26 mmol/L (ref 22–32)
Calcium: 9.2 mg/dL (ref 8.9–10.3)
Chloride: 103 mmol/L (ref 98–111)
Creatinine, Ser: 1.02 mg/dL — ABNORMAL HIGH (ref 0.44–1.00)
GFR calc Af Amer: >60 mL/min (ref >60)
GFR calc non Af Amer: 56 mL/min — ABNORMAL LOW (ref >60)
Glucose, Bld: 95 mg/dL (ref 70–99)
Potassium: 3.9 mmol/L (ref 3.5–5.1)
Sodium: 142 mmol/L (ref 135–145)
Total Bilirubin: 0.5 mg/dL (ref 0.3–1.2)
Total Protein: 7.3 g/dL (ref 6.5–8.1)

## 2017-09-05 LAB — CBC WITH DIFFERENTIAL/PLATELET
Basophils Absolute: 0 10*3/uL (ref 0.0–0.1)
Basophils Relative: 0 %
Eosinophils Absolute: 0.1 10*3/uL (ref 0.0–0.5)
Eosinophils Relative: 2 %
HCT: 39.3 % (ref 34.8–46.6)
Hemoglobin: 13.1 g/dL (ref 11.6–15.9)
Lymphocytes Relative: 10 %
Lymphs Abs: 0.7 10*3/uL — ABNORMAL LOW (ref 0.9–3.3)
MCH: 28.9 pg (ref 25.1–34.0)
MCHC: 33.3 g/dL (ref 31.5–36.0)
MCV: 86.8 fL (ref 79.5–101.0)
Monocytes Absolute: 0.4 10*3/uL (ref 0.1–0.9)
Monocytes Relative: 5 %
Neutro Abs: 5.8 10*3/uL (ref 1.5–6.5)
Neutrophils Relative %: 83 %
Platelets: 236 10*3/uL (ref 145–400)
RBC: 4.53 MIL/uL (ref 3.70–5.45)
RDW: 14.2 % (ref 11.2–14.5)
WBC: 7 10*3/uL (ref 3.9–10.3)

## 2017-09-05 MED ORDER — ANASTROZOLE 1 MG PO TABS: 1.0000 mg | ORAL_TABLET | Freq: Every day | ORAL | 3 refills | Status: DC

## 2017-09-05 NOTE — Telephone Encounter (Signed)
Appt scheduled AVS/Calendar printed per 8/2 los °

## 2017-09-10 ENCOUNTER — Other Ambulatory Visit: Payer: Self-pay

## 2017-09-10 DIAGNOSIS — E1169 Type 2 diabetes mellitus with other specified complication: Secondary | ICD-10-CM

## 2017-09-10 DIAGNOSIS — E785 Hyperlipidemia, unspecified: Principal | ICD-10-CM

## 2017-09-10 MED ORDER — PRAVASTATIN SODIUM 20 MG PO TABS: 20.0000 mg | ORAL_TABLET | Freq: Every day | ORAL | 3 refills | Status: DC

## 2017-09-23 ENCOUNTER — Other Ambulatory Visit: Payer: Self-pay

## 2017-09-23 DIAGNOSIS — R1084 Generalized abdominal pain: Secondary | ICD-10-CM

## 2017-09-23 DIAGNOSIS — I152 Hypertension secondary to endocrine disorders: Secondary | ICD-10-CM

## 2017-09-23 MED ORDER — CLONIDINE HCL 0.1 MG PO TABS: 0.1000 mg | ORAL_TABLET | Freq: Two times a day (BID) | ORAL | 0 refills | Status: DC

## 2017-09-23 MED ORDER — RANITIDINE HCL 150 MG PO TABS
150.0000 mg | ORAL_TABLET | Freq: Two times a day (BID) | ORAL | 0 refills | Status: DC
Start: 2017-09-23 — End: 2017-10-19

## 2017-09-30 ENCOUNTER — Other Ambulatory Visit: Payer: Self-pay

## 2017-09-30 DIAGNOSIS — M79604 Pain in right leg: Secondary | ICD-10-CM

## 2017-09-30 DIAGNOSIS — M545 Low back pain: Secondary | ICD-10-CM

## 2017-10-01 MED ORDER — GABAPENTIN 300 MG PO CAPS: ORAL_CAPSULE | ORAL | 4 refills | Status: DC

## 2017-10-02 ENCOUNTER — Ambulatory Visit: Payer: Medicare Other | Admitting: Internal Medicine

## 2017-10-18 ENCOUNTER — Other Ambulatory Visit: Payer: Self-pay | Admitting: Internal Medicine

## 2017-10-18 DIAGNOSIS — I152 Hypertension secondary to endocrine disorders: Secondary | ICD-10-CM

## 2017-10-19 ENCOUNTER — Other Ambulatory Visit: Payer: Self-pay | Admitting: Internal Medicine

## 2017-10-19 DIAGNOSIS — R1084 Generalized abdominal pain: Secondary | ICD-10-CM

## 2017-10-23 ENCOUNTER — Ambulatory Visit: Payer: Medicare Other | Attending: Internal Medicine | Admitting: Internal Medicine

## 2017-10-23 VITALS — BP 113/81 | HR 70 | Temp 98.6°F | Resp 16 | Wt 209.8 lb

## 2017-10-23 DIAGNOSIS — M48062 Spinal stenosis, lumbar region with neurogenic claudication: Secondary | ICD-10-CM | POA: Diagnosis not present

## 2017-10-23 DIAGNOSIS — G8929 Other chronic pain: Secondary | ICD-10-CM | POA: Diagnosis not present

## 2017-10-23 DIAGNOSIS — I10 Essential (primary) hypertension: Secondary | ICD-10-CM | POA: Insufficient documentation

## 2017-10-23 DIAGNOSIS — Z853 Personal history of malignant neoplasm of breast: Secondary | ICD-10-CM | POA: Diagnosis not present

## 2017-10-23 DIAGNOSIS — M1611 Unilateral primary osteoarthritis, right hip: Secondary | ICD-10-CM | POA: Diagnosis not present

## 2017-10-23 DIAGNOSIS — Z923 Personal history of irradiation: Secondary | ICD-10-CM | POA: Diagnosis not present

## 2017-10-23 DIAGNOSIS — Z6841 Body Mass Index (BMI) 40.0 and over, adult: Secondary | ICD-10-CM | POA: Insufficient documentation

## 2017-10-23 DIAGNOSIS — E785 Hyperlipidemia, unspecified: Secondary | ICD-10-CM | POA: Insufficient documentation

## 2017-10-23 DIAGNOSIS — M5416 Radiculopathy, lumbar region: Secondary | ICD-10-CM

## 2017-10-23 DIAGNOSIS — M47816 Spondylosis without myelopathy or radiculopathy, lumbar region: Secondary | ICD-10-CM | POA: Insufficient documentation

## 2017-10-23 DIAGNOSIS — N993 Prolapse of vaginal vault after hysterectomy: Secondary | ICD-10-CM | POA: Insufficient documentation

## 2017-10-23 DIAGNOSIS — Z23 Encounter for immunization: Secondary | ICD-10-CM | POA: Diagnosis not present

## 2017-10-23 DIAGNOSIS — E119 Type 2 diabetes mellitus without complications: Secondary | ICD-10-CM

## 2017-10-23 DIAGNOSIS — Z90710 Acquired absence of both cervix and uterus: Secondary | ICD-10-CM | POA: Insufficient documentation

## 2017-10-23 DIAGNOSIS — F329 Major depressive disorder, single episode, unspecified: Secondary | ICD-10-CM | POA: Insufficient documentation

## 2017-10-23 LAB — POCT GLYCOSYLATED HEMOGLOBIN (HGB A1C): HbA1c, POC (prediabetic range): 5.7 % (ref 5.7–6.4)

## 2017-10-23 LAB — GLUCOSE, POCT (MANUAL RESULT ENTRY): POC Glucose: 110 mg/dl — AB (ref 70–99)

## 2017-10-23 MED ORDER — TRAMADOL HCL 50 MG PO TABS: 50.0000 mg | ORAL_TABLET | ORAL | 0 refills | Status: DC | PRN

## 2017-10-23 MED ORDER — TRAMADOL HCL 50 MG PO TABS
50.0000 mg | ORAL_TABLET | ORAL | 0 refills | Status: DC | PRN
Start: 2017-10-23 — End: 2018-01-22

## 2017-10-23 NOTE — Progress Notes (Signed)
cbg- [id number]-

## 2017-10-23 NOTE — Patient Instructions (Signed)

## 2017-10-23 NOTE — Progress Notes (Signed)
Patient ID: Rachel Herman, female    DOB: [birth year]  MRN: [id number]  CC: Diabetes and Hypertension   Subjective: Rachel Herman is a 67 y.o. female who presents for chronic disease management. Her concerns today include:  Pt with hx of HTN, DM, HL, DJD lumbar and spinal stenosiswith hx of laminectomy 2016, obesity, depression, Ductal  CA LT s/p lumpectomy/XRT (2015 and 2016, followed by Dr. Annamaria Boots), vaginal prolapse  HTN:  No device to check BP Out of Clonidine for a few days Limits salt in foods Denies any chest pains or shortness of breath.  No lower extremity edema.  DM: meter broke in July.  Request rxn for meter Compliant with Metformin Eating habits okay. Due for eye exam. Plans to call Dr. Gout to schedule.  No blurred vision No numbness in feet.  OA RT hip:  Steroid inj was helpful Chronic LBP:  Still "bothering me." No worse than before, about the same.  Out of Tramadol a few wks. Was taking Tylenol Q 2-3 hrs in the meantime until she came to appointment today for refills on tramadol.  Patient Active Problem List   Diagnosis Date Noted  . Right hip pain 08/29/2016  . 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 Lumbar pain with radiation down right leg 07/02/2016  . Positive H. pylori test 02/27/2016  . Intercostal pain 02/26/2016  . Thyroid nodule 06/09/2015  . Neck pain on right side 05/24/2015  . Lumbar stenosis with neurogenic claudication 12/15/2014  . Obesity (BMI 30-39.9) 09/08/2014  . Nodule of right lung 07/28/2014  . Diverticulosis of colon without hemorrhage 07/28/2014  . Depressed mood 03/09/2014  . DJD (degenerative joint disease), lumbar 02/22/2014  . Lumbar pain with radiation down left leg 01/17/2014  . Breast cancer of upper-inner quadrant of left female breast ([place]) 01/05/2014  . Hyperlipidemia associated with type 2 diabetes mellitus ([place]) 09/28/2013  . S/P total hysterectomy and bilateral salpingo-oophorectomy  09/20/2013  . Morbid obesity with body mass index of 40.0-44.9 in adult ([place]) 09/17/2013  . Essential hypertension   . Diabetes mellitus ([place]) 09/16/2013     Current Outpatient Medications on File Prior to Visit  Medication Sig Dispense Refill  . anastrozole (ARIMIDEX) 1 MG tablet Take 1 tablet (1 mg total) by mouth daily. 90 tablet 3  . Ascorbic Acid (VITAMIN C) 1000 MG tablet Take 1,000 mg by mouth daily.    Marland Kitchen aspirin 325 MG tablet Take 325 mg by mouth once.    . Blood Glucose Monitoring Suppl (TRUE METRIX METER) w/Device KIT 1 each by Does not apply route as needed. 1 kit 0  . cloNIDine (CATAPRES) 0.1 MG tablet TAKE 1 TABLET BY MOUTH TWICE A DAY 60 tablet 2  . [facility] SENNA PLUS 8.6-50 MG tablet TAKE 2 TABLETS BY MOUTH AT BEDTIME AS NEEDED FOR MILD CONSTIPATION 60 tablet 11  . gabapentin (NEURONTIN) 300 MG capsule 300 mg during the day, 600 mg at night by mouth 270 capsule 4  . glucose blood test strip Use as instructed 100 each 12  . hydrochlorothiazide (HYDRODIURIL) 25 MG tablet Take 1 tablet (25 mg total) by mouth daily. 90 tablet 3  . Lancets (ACCU-CHEK MULTICLIX) lancets 1 each by Other route 3 (three) times daily. ICD 10 E11.9 100 each 12  . metFORMIN (GLUCOPHAGE) 500 MG tablet Take 1 tablet (500 mg total) by mouth 2 (two) times daily. 180 tablet 3  . Multiple Vitamins-Minerals (MULTIVITAMIN WITH MINERALS) tablet Take 1 tablet by  mouth daily. Reported on 03/13/2015    . potassium chloride (K-DUR) 10 MEQ tablet Take 1 tablet (10 mEq total) by mouth daily. 90 tablet 2  . pravastatin (PRAVACHOL) 20 MG tablet Take 1 tablet (20 mg total) by mouth daily. 90 tablet 3  . ranitidine (ZANTAC) 150 MG tablet TAKE 1 TABLET BY MOUTH TWICE A DAY 60 tablet 2  . vitamin D, CHOLECALCIFEROL, 400 UNITS tablet Take 400 Units by mouth daily.     No current facility-administered medications on file prior to visit.     No Known Allergies  Social History   Socioeconomic History  . Marital status: Married      Spouse name: Medrith Veillon   . Number of children: 3  . Years of education: 68  . Highest education level: Not on file  Occupational History    Employer: UNEMPLOYED  Social Needs  . Financial resource strain: Not on file  . Food insecurity:    Worry: Not on file    Inability: Not on file  . Transportation needs:    Medical: Not on file    Non-medical: Not on file  Tobacco Use  . Smoking status: Never Smoker  . Smokeless tobacco: Never Used  Substance and Sexual Activity  . Alcohol use: No  . Drug use: No  . Sexual activity: Not Currently    Birth control/protection: Post-menopausal  Lifestyle  . Physical activity:    Days per week: Not on file    Minutes per session: Not on file  . Stress: Not on file  Relationships  . Social connections:    Talks on phone: Not on file    Gets together: Not on file    Attends religious service: Not on file    Active member of club or organization: Not on file    Attends meetings of clubs or organizations: Not on file    Relationship status: Not on file  . Intimate partner violence:    Fear of current or ex partner: Not on file    Emotionally abused: Not on file    Physically abused: Not on file    Forced sexual activity: Not on file  Other Topics Concern  . Not on file  Social History Narrative   Married to [employer] in 1986.    Has 3 children from previous marriage, 1 in [place], 1 in [place], 1 in prison ([place]).    Lives with husband.    Right-handed.   1 cup caffeine per day.    Family History  Problem Relation Age of Onset  . Diabetes Mother   . Multiple myeloma Mother 8  . Hearing loss Mother   . Diabetes Brother   . Cancer Brother 40       prostate cancer   . Diabetes Maternal Aunt   . Leukemia Maternal Aunt        dx in her 62s  . Alcoholism Brother   . Heart attack Father   . Diabetes Sister   . Diabetes Son     Past Surgical History:  Procedure Laterality Date  . ABDOMINAL  HYSTERECTOMY N/A 09/20/2013   Procedure: HYSTERECTOMY ABDOMINAL;  Surgeon: Osborne Oman, MD;  Location: [facility];  Service: Gynecology;  Laterality: N/A;  . BREAST LUMPECTOMY Left 2015   radiation  . BREAST LUMPECTOMY WITH AXILLARY LYMPH NODE BIOPSY Left 12/29/13   left breast   . LAPAROSCOPIC ASSISTED VAGINAL HYSTERECTOMY  09/20/2013   Procedure: LAPAROSCOPIC ASSISTED VAGINAL HYSTERECTOMY;  Surgeon:  Osborne Oman, MD;  Location: [facility];  Service: Gynecology;;  PT WAS EXAMINED WHILE UP IN [place] AND IT WAS DECIDED TO OPEN PT DUE TO LARGE MASS  . LUMBAR LAMINECTOMY/DECOMPRESSION MICRODISCECTOMY Right 12/14/2014   Procedure: Right Lumbar three-four microdiskectomy;  Surgeon: Newman Pies, MD;  Location: [facility];  Service: Neurosurgery;  Laterality: Right;  Right Lumbar three-four microdiskectomy  . RADIOACTIVE SEED GUIDED PARTIAL MASTECTOMY WITH AXILLARY SENTINEL LYMPH NODE BIOPSY Left 12/29/2013   Procedure: LEFT BREAST RADIOACTIVE SEED LOCALIZED LUMPECTOMY WITH SENTINEL LYMPH NODE MAPPING;  Surgeon: Erroll Luna, MD;  Location: [facility];  Service: General;  Laterality: Left;  . RE-EXCISION OF BREAST LUMPECTOMY Left 03/02/2014   Procedure: RE-EXCISION OF LEFT BREAST LUMPECTOMY;  Surgeon: Erroll Luna, MD;  Location: [facility];  Service: General;  Laterality: Left;  . SALPINGOOPHORECTOMY  09/20/2013   Procedure: SALPINGO OOPHORECTOMY;  Surgeon: Osborne Oman, MD;  Location: [facility];  Service: Gynecology;;    ROS: Review of Systems Neg except as above PHYSICAL EXAM: BP 113/81   Pulse 70   Temp 98.6 F (37 C) (Oral)   Resp 16   Wt 209 lb 12.8 oz (95.2 kg)   SpO2 96%   BMI 32.86 kg/m   BP 138/86 Physical Exam  General appearance - alert, well appearing, and in no distress Mental status - normal mood, behavior, speech, dress, motor activity, and thought processes Neck - supple, no significant adenopathy Chest - clear to auscultation, no  wheezes, rales or rhonchi, symmetric air entry Heart - normal rate, regular rhythm, normal S1, S2, no murmurs, rubs, clicks or gallops Musculoskeletal -gait is normal.  Mild tenderness on palpation of the lumbar spine.   Extremities - peripheral pulses normal, no pedal edema, no clubbing or cyanosis   A1C 5.7   ASSESSMENT AND PLAN: 1. Type 2 diabetes mellitus without complication, without long-term current use of insulin (HCC) At goal.  Continue metformin.  My CMA will look into which meter is covered by her insurance and we will send a prescription to her pharmacy - POCT glucose (manual entry) - POCT glycosylated hemoglobin (Hb A1C) - Microalbumin / creatinine urine ratio  2. Essential hypertension Close to goal.  Patient advised that refill was sent on clonidine 3 days ago to her pharmacy.  3. Chronic radicular pain of lower back Advised patient that she is taking too much Tylenol which can lead to problems with the liver.  Refill given on tramadol.  She reports good relief with tramadol.  Denies any significant side effects from it. - traMADol (ULTRAM) 50 MG tablet; Take 1 tablet (50 mg total) by mouth every 4 (four) hours as needed for moderate pain.  Dispense: 120 tablet; Refill: 0 - traMADol (ULTRAM) 50 MG tablet; Take 1 tablet (50 mg total) by mouth every 4 (four) hours as needed. To fill on or after 11/21/2017  Dispense: 120 tablet; Refill: 0 - traMADol (ULTRAM) 50 MG tablet; Take 1 tablet (50 mg total) by mouth every 4 (four) hours as needed. To fill on or after 12/22/2017  Dispense: 120 tablet; Refill: 0  4. Need for influenza vaccination Given today.  5.  Declined Prevnar 13  Patient was given the opportunity to ask questions.  Patient verbalized understanding of the plan and was able to repeat key elements of the plan.   Orders Placed This Encounter  Procedures  . Microalbumin / creatinine urine ratio  . POCT glucose (manual entry)  . POCT glycosylated  hemoglobin (Hb  A1C)     Requested Prescriptions   Signed Prescriptions Disp Refills  . traMADol (ULTRAM) 50 MG tablet 120 tablet 0    Sig: Take 1 tablet (50 mg total) by mouth every 4 (four) hours as needed for moderate pain.  . traMADol (ULTRAM) 50 MG tablet 120 tablet 0    Sig: Take 1 tablet (50 mg total) by mouth every 4 (four) hours as needed. To fill on or after 11/21/2017  . traMADol (ULTRAM) 50 MG tablet 120 tablet 0    Sig: Take 1 tablet (50 mg total) by mouth every 4 (four) hours as needed. To fill on or after 12/22/2017    Return in about 3 months (around 01/22/2018).  Karle Plumber, MD, FACP

## 2017-10-24 LAB — MICROALBUMIN / CREATININE URINE RATIO
Creatinine, Urine: 137.9 mg/dL
Microalb/Creat Ratio: 8.7 mg/g creat (ref 0.0–30.0)
Microalbumin, Urine: 12 ug/mL

## 2017-11-10 ENCOUNTER — Other Ambulatory Visit: Payer: Self-pay

## 2017-11-10 DIAGNOSIS — E119 Type 2 diabetes mellitus without complications: Secondary | ICD-10-CM

## 2017-11-10 MED ORDER — ONETOUCH ULTRA MINI W/DEVICE KIT: PACK | 0 refills | Status: DC

## 2017-11-10 MED ORDER — GLUCOSE BLOOD VI STRP: ORAL_STRIP | 12 refills | Status: DC

## 2017-11-10 MED ORDER — ONETOUCH DELICA LANCETS 33G MISC: 12 refills | Status: DC

## 2017-11-10 NOTE — Telephone Encounter (Signed)
The patient was expecting an RX for meter, strips and lancets after her last visit, it was noted in the encounter by Dr. Wynetta Emery, RX was sent.

## 2017-12-11 ENCOUNTER — Other Ambulatory Visit: Payer: Self-pay | Admitting: Internal Medicine

## 2017-12-11 DIAGNOSIS — I152 Hypertension secondary to endocrine disorders: Secondary | ICD-10-CM

## 2018-01-14 ENCOUNTER — Other Ambulatory Visit: Payer: Self-pay | Admitting: Internal Medicine

## 2018-01-14 DIAGNOSIS — I152 Hypertension secondary to endocrine disorders: Secondary | ICD-10-CM

## 2018-01-22 ENCOUNTER — Ambulatory Visit: Payer: Medicare Other | Attending: Internal Medicine | Admitting: Internal Medicine

## 2018-01-22 ENCOUNTER — Encounter: Payer: Self-pay | Admitting: Internal Medicine

## 2018-01-22 VITALS — BP 118/80 | HR 78 | Temp 98.5°F | Resp 16 | Wt 216.6 lb

## 2018-01-22 DIAGNOSIS — E785 Hyperlipidemia, unspecified: Secondary | ICD-10-CM

## 2018-01-22 DIAGNOSIS — E1169 Type 2 diabetes mellitus with other specified complication: Secondary | ICD-10-CM | POA: Diagnosis not present

## 2018-01-22 DIAGNOSIS — R252 Cramp and spasm: Secondary | ICD-10-CM

## 2018-01-22 DIAGNOSIS — M5416 Radiculopathy, lumbar region: Secondary | ICD-10-CM | POA: Diagnosis not present

## 2018-01-22 DIAGNOSIS — Z853 Personal history of malignant neoplasm of breast: Secondary | ICD-10-CM

## 2018-01-22 DIAGNOSIS — E669 Obesity, unspecified: Secondary | ICD-10-CM

## 2018-01-22 DIAGNOSIS — I10 Essential (primary) hypertension: Secondary | ICD-10-CM

## 2018-01-22 DIAGNOSIS — G8929 Other chronic pain: Secondary | ICD-10-CM

## 2018-01-22 LAB — POCT GLYCOSYLATED HEMOGLOBIN (HGB A1C): Hemoglobin A1C: 6 % — AB (ref 4.0–5.6)

## 2018-01-22 LAB — GLUCOSE, POCT (MANUAL RESULT ENTRY): POC Glucose: 100 mg/dl — AB (ref 70–99)

## 2018-01-22 MED ORDER — TRAMADOL HCL 50 MG PO TABS: 50.0000 mg | ORAL_TABLET | ORAL | 0 refills | Status: DC | PRN

## 2018-01-22 NOTE — Progress Notes (Signed)
Patient ID: Rachel Herman, female    DOB: 23-Jan-1951  MRN: [id number]  CC: Hypertension and Diabetes   Subjective: Rachel Herman is a 67 y.o. female who presents for chronic ds management. Her concerns today include:  Pt with hx of HTN, DM, HL, DJD lumbar and spinal stenosiswith hx of laminectomy 2016, obesity, depression,DuctalCA LT s/plumpectomy/XRT (2015 and 2016, followed by Dr. Annamaria Boots), vaginal prolapse  Chronic LBP: c/o aching in lower back.  Takes Tramadol Q 6 hrs.  Gives relief for about 4 hrs.  Takes Tylenol 500 mg about 3 hrs after a Tramadol.  Reports that she is getting by okay with this combination.  She has not found a heating pad to be helpful.  She uses capsaicin cream sometimes which helps.  She requests refill on tramadol.  She denies any major side effects from the medication.  She is still very functional.  She uses her cane more when she is out in public.  Denies any falls. Walks in her neighborhood [place] for 20-30 mins and goes out on Saturdays to different stores and walks a lot.    HTN:  Compliant with meds and salt restriction No device to check BP Has cramps in legs at nights and with prolonged sitting x 2 wks.  Reports compliance with potassium supplement Denies any chest pains or shortness of breath.  No lower extremity edema.  DM: checking BS daily.  Gives range of 83-110.  Reports compliance with metformin. She has not done eye exam as yet.  Plans to schedule the first of the yr.  Eating Habits: poor appetite.  Drinks regular [employer] 2 x a day.  wgh up 7 lbs but pt in layers of clothing today  HL:  On Pravachol which she takes consistently.  Hx of breast CA:  Last seen by her oncologist 09/2017.  She continues on Arimidex  No problems with depression at this time  Patient Active Problem List   Diagnosis Date Noted  . Right hip pain 08/29/2016  . 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 Lumbar pain with radiation down  right leg 07/02/2016  . Positive H. pylori test 02/27/2016  . Intercostal pain 02/26/2016  . Thyroid nodule 06/09/2015  . Neck pain on right side 05/24/2015  . Lumbar stenosis with neurogenic claudication 12/15/2014  . Obesity (BMI 30-39.9) 09/08/2014  . Nodule of right lung 07/28/2014  . Diverticulosis of colon without hemorrhage 07/28/2014  . Depressed mood 03/09/2014  . DJD (degenerative joint disease), lumbar 02/22/2014  . Lumbar pain with radiation down left leg 01/17/2014  . Breast cancer of upper-inner quadrant of left female breast ([place]) 01/05/2014  . Hyperlipidemia associated with type 2 diabetes mellitus ([place]) 09/28/2013  . S/P total hysterectomy and bilateral salpingo-oophorectomy 09/20/2013  . Morbid obesity with body mass index of 40.0-44.9 in adult ([place]) 09/17/2013  . Essential hypertension   . Diabetes mellitus ([place]) 09/16/2013     Current Outpatient Medications on File Prior to Visit  Medication Sig Dispense Refill  . anastrozole (ARIMIDEX) 1 MG tablet Take 1 tablet (1 mg total) by mouth daily. 90 tablet 3  . Ascorbic Acid (VITAMIN C) 1000 MG tablet Take 1,000 mg by mouth daily.    Marland Kitchen aspirin 325 MG tablet Take 325 mg by mouth once.    . Blood Glucose Monitoring Suppl (ONE TOUCH ULTRA MINI) w/Device KIT USE AS DIRECTED ONCE DAILY DX CODE E11.9 1 each 0  . cloNIDine (CATAPRES) 0.1 MG tablet TAKE  1 TABLET BY MOUTH TWICE A DAY 60 tablet 0  . CVS SENNA PLUS 8.6-50 MG tablet TAKE 2 TABLETS BY MOUTH AT BEDTIME AS NEEDED FOR MILD CONSTIPATION 60 tablet 11  . gabapentin (NEURONTIN) 300 MG capsule 300 mg during the day, 600 mg at night by mouth 270 capsule 4  . glucose blood (ONE TOUCH ULTRA TEST) test strip USE AS DIRECTED ONCE DAILY DX CODE E11.9 100 each 12  . hydrochlorothiazide (HYDRODIURIL) 25 MG tablet TAKE 1 TABLET BY MOUTH EVERY DAY 90 tablet 0  . metFORMIN (GLUCOPHAGE) 500 MG tablet Take 1 tablet (500 mg total) by mouth 2 (two) times daily. 180 tablet 3  . Multiple  Vitamins-Minerals (MULTIVITAMIN WITH MINERALS) tablet Take 1 tablet by mouth daily. Reported on 03/13/2015    . ONETOUCH DELICA LANCETS 63A MISC USE AS DIRECTED ONCE DAILY DX CODE E11.9 100 each 12  . potassium chloride (K-DUR) 10 MEQ tablet Take 1 tablet (10 mEq total) by mouth daily. 90 tablet 2  . pravastatin (PRAVACHOL) 20 MG tablet Take 1 tablet (20 mg total) by mouth daily. 90 tablet 3  . ranitidine (ZANTAC) 150 MG tablet TAKE 1 TABLET BY MOUTH TWICE A DAY 60 tablet 2  . traMADol (ULTRAM) 50 MG tablet Take 1 tablet (50 mg total) by mouth every 4 (four) hours as needed for moderate pain. 120 tablet 0  . traMADol (ULTRAM) 50 MG tablet Take 1 tablet (50 mg total) by mouth every 4 (four) hours as needed. To fill on or after 11/21/2017 120 tablet 0  . traMADol (ULTRAM) 50 MG tablet Take 1 tablet (50 mg total) by mouth every 4 (four) hours as needed. To fill on or after 12/22/2017 120 tablet 0  . vitamin D, CHOLECALCIFEROL, 400 UNITS tablet Take 400 Units by mouth daily.     No current facility-administered medications on file prior to visit.     No Known Allergies  Social History   Socioeconomic History  . Marital status: Married    Spouse name: Fayetta Sorenson   . Number of children: 3  . Years of education: 67  . Highest education level: Not on file  Occupational History    Employer: UNEMPLOYED  Social Needs  . Financial resource strain: Not on file  . Food insecurity:    Worry: Not on file    Inability: Not on file  . Transportation needs:    Medical: Not on file    Non-medical: Not on file  Tobacco Use  . Smoking status: Never Smoker  . Smokeless tobacco: Never Used  Substance and Sexual Activity  . Alcohol use: No  . Drug use: No  . Sexual activity: Not Currently    Birth control/protection: Post-menopausal  Lifestyle  . Physical activity:    Days per week: Not on file    Minutes per session: Not on file  . Stress: Not on file  Relationships  . Social connections:      Talks on phone: Not on file    Gets together: Not on file    Attends religious service: Not on file    Active member of club or organization: Not on file    Attends meetings of clubs or organizations: Not on file    Relationship status: Not on file  . Intimate partner violence:    Fear of current or ex partner: Not on file    Emotionally abused: Not on file    Physically abused: Not on file    Forced  sexual activity: Not on file  Other Topics Concern  . Not on file  Social History Narrative   Married to [employer] in 1986.    Has 3 children from previous marriage, 1 in [place], 1 in [place], 1 in prison ([place]).    Lives with husband.    Right-handed.   1 cup caffeine per day.    Family History  Problem Relation Age of Onset  . Diabetes Mother   . Multiple myeloma Mother 60  . Hearing loss Mother   . Diabetes Brother   . Cancer Brother 40       prostate cancer   . Diabetes Maternal Aunt   . Leukemia Maternal Aunt        dx in her 70s  . Alcoholism Brother   . Heart attack Father   . Diabetes Sister   . Diabetes Son     Past Surgical History:  Procedure Laterality Date  . ABDOMINAL HYSTERECTOMY N/A 09/20/2013   Procedure: HYSTERECTOMY ABDOMINAL;  Surgeon: Osborne Oman, MD;  Location: [facility];  Service: Gynecology;  Laterality: N/A;  . BREAST LUMPECTOMY Left 2015   radiation  . BREAST LUMPECTOMY WITH AXILLARY LYMPH NODE BIOPSY Left 12/29/13   left breast   . LAPAROSCOPIC ASSISTED VAGINAL HYSTERECTOMY  09/20/2013   Procedure: LAPAROSCOPIC ASSISTED VAGINAL HYSTERECTOMY;  Surgeon: Osborne Oman, MD;  Location: [facility];  Service: Gynecology;;  PT WAS EXAMINED WHILE UP IN [place] AND IT WAS DECIDED TO OPEN PT DUE TO LARGE MASS  . LUMBAR LAMINECTOMY/DECOMPRESSION MICRODISCECTOMY Right 12/14/2014   Procedure: Right Lumbar three-four microdiskectomy;  Surgeon: Newman Pies, MD;  Location: [facility];  Service: Neurosurgery;  Laterality: Right;  Right  Lumbar three-four microdiskectomy  . RADIOACTIVE SEED GUIDED PARTIAL MASTECTOMY WITH AXILLARY SENTINEL LYMPH NODE BIOPSY Left 12/29/2013   Procedure: LEFT BREAST RADIOACTIVE SEED LOCALIZED LUMPECTOMY WITH SENTINEL LYMPH NODE MAPPING;  Surgeon: Erroll Luna, MD;  Location: [facility];  Service: General;  Laterality: Left;  . RE-EXCISION OF BREAST LUMPECTOMY Left 03/02/2014   Procedure: RE-EXCISION OF LEFT BREAST LUMPECTOMY;  Surgeon: Erroll Luna, MD;  Location: [facility];  Service: General;  Laterality: Left;  . SALPINGOOPHORECTOMY  09/20/2013   Procedure: SALPINGO OOPHORECTOMY;  Surgeon: Osborne Oman, MD;  Location: [facility];  Service: Gynecology;;    ROS: Review of Systems Negative except as above PHYSICAL EXAM: BP 118/80   Pulse 78   Temp 98.5 F (36.9 C) (Oral)   Resp 16   Wt 216 lb 9.6 oz (98.2 kg)   SpO2 98%   BMI 33.92 kg/m   Wt Readings from Last 3 Encounters:  01/22/18 216 lb 9.6 oz (98.2 kg)  10/23/17 209 lb 12.8 oz (95.2 kg)  09/05/17 209 lb 3.2 oz (94.9 kg)    Physical Exam  General appearance - alert, well appearing, and in no distress Mental status - normal mood, behavior, speech, dress, motor activity, and thought processes Mouth - mucous membranes moist, pharynx normal without lesions Neck - supple, no significant adenopathy Chest - clear to auscultation, no wheezes, rales or rhonchi, symmetric air entry Heart - normal rate, regular rhythm, normal S1, S2, no murmurs, rubs, clicks or gallops Extremities - peripheral pulses normal, no pedal edema, no clubbing or cyanosis MSK: Patient does not have a cane with her in the exam room.  She ambulates and transfers independently.  Mild tenderness on palpation of the lumbar spine. Diabetic Foot Exam - Simple   Simple  Foot Form Visual Inspection See comments:  Yes Sensation Testing Intact to touch and monofilament testing bilaterally:  Yes Pulse Check Posterior Tibialis and  Dorsalis pulse intact bilaterally:  Yes Comments Small callus on the ball of the fifth toe bilaterally     Depression screen [facility] 2/9 01/22/2018 10/23/2017 07/01/2017  Decreased Interest 0 0 1  Down, Depressed, Hopeless 0 (No Data) 0  PHQ - 2 Score 0 0 1  Altered sleeping - 0 -  Tired, decreased energy - 2 -  Change in appetite - 1 -  Feeling bad or failure about yourself  - 0 -  Trouble concentrating - 0 -  Moving slowly or fidgety/restless - 0 -  Suicidal thoughts - 0 -  PHQ-9 Score - 3 -  Some recent data might be hidden     Results for orders placed or performed in visit on 01/22/18  POCT glucose (manual entry)  Result Value Ref Range   POC Glucose 100 (A) 70 - 99 mg/dl  POCT glycosylated hemoglobin (Hb A1C)  Result Value Ref Range   Hemoglobin A1C 6.0 (A) 4.0 - 5.6 %   HbA1c POC (<> result, manual entry)     HbA1c, POC (prediabetic range)     HbA1c, POC (controlled diabetic range)      ASSESSMENT AND PLAN: 1. Type 2 diabetes mellitus with obesity (HCC) At goal.  Continue metformin.  Dietary counseling given.  Advised patient to do away with sugary drinks.  Commended her on trying to stay active. Encouraged her to get her eye exam done in the new year.  2. Hyperlipidemia associated with type 2 diabetes mellitus ([place]) Pravachol may be causing the cramps in the legs.  However we will check the potassium level first.  If the potassium level is normal I will have her try cutting the Pravachol dose in half or taking it every other day. - POCT glucose (manual entry) - POCT glycosylated hemoglobin (Hb A1C) - Lipid panel  3. Chronic radicular pain of lower back Patient is functional and doing well on the tramadol along with Tylenol.  She does benefit from being on the medication. [place] controlled substance reporting system reviewed and is appropriate. Refill given on tramadol - traMADol (ULTRAM) 50 MG tablet; Take 1 tablet (50 mg total) by mouth every 4 (four) hours  as needed for moderate pain. Fill on or after 02/22/2018  Dispense: 120 tablet; Refill: 0 - traMADol (ULTRAM) 50 MG tablet; Take 1 tablet (50 mg total) by mouth every 4 (four) hours as needed. To fill on or after 03/25/2018  Dispense: 120 tablet; Refill: 0 - traMADol (ULTRAM) 50 MG tablet; Take 1 tablet (50 mg total) by mouth every 4 (four) hours as needed.  Dispense: 120 tablet; Refill: 0  4. Essential hypertension At goal.  Continue current medications  5. Cramp in lower leg associated with rest See #2 above - Potassium  6. History of cancer of left breast In remission.  She is on Arimidex.    Patient was given the opportunity to ask questions.  Patient verbalized understanding of the plan and was able to repeat key elements of the plan.   Orders Placed This Encounter  Procedures  . POCT glucose (manual entry)  . POCT glycosylated hemoglobin (Hb A1C)     Requested Prescriptions    No prescriptions requested or ordered in this encounter    No follow-ups on file.  Karle Plumber, MD, FACP

## 2018-01-22 NOTE — Progress Notes (Signed)
Pt states her back pain radiates down to her right leg

## 2018-01-23 LAB — LIPID PANEL
Chol/HDL Ratio: 4.6 ratio — ABNORMAL HIGH (ref 0.0–4.4)
Cholesterol, Total: 190 mg/dL (ref 100–199)
HDL: 41 mg/dL (ref >39)
LDL Calculated: 92 mg/dL (ref 0–99)
Triglycerides: 286 mg/dL — ABNORMAL HIGH (ref 0–149)
VLDL Cholesterol Cal: 57 mg/dL — ABNORMAL HIGH (ref 5–40)

## 2018-01-23 LAB — POTASSIUM: Potassium: 4.2 mmol/L (ref 3.5–5.2)

## 2018-01-26 ENCOUNTER — Telehealth: Payer: Self-pay

## 2018-01-26 NOTE — Telephone Encounter (Signed)
Contacted pt to go over lab results pt is aware and doesn't have any questions or concerns 

## 2018-02-12 ENCOUNTER — Other Ambulatory Visit: Payer: Self-pay | Admitting: Internal Medicine

## 2018-02-12 DIAGNOSIS — I152 Hypertension secondary to endocrine disorders: Secondary | ICD-10-CM

## 2018-02-24 ENCOUNTER — Other Ambulatory Visit: Payer: Self-pay | Admitting: Internal Medicine

## 2018-02-24 DIAGNOSIS — E876 Hypokalemia: Secondary | ICD-10-CM

## 2018-02-24 DIAGNOSIS — I152 Hypertension secondary to endocrine disorders: Secondary | ICD-10-CM

## 2018-02-24 NOTE — Telephone Encounter (Signed)
1) Medication(s) Requested (by name):  clonidine Hydrochlorothiazide Potassium chloride 2) Pharmacy of Choice: [facility] on [address]

## 2018-02-26 MED ORDER — HYDROCHLOROTHIAZIDE 25 MG PO TABS: 25.0000 mg | ORAL_TABLET | Freq: Every day | ORAL | 0 refills | Status: DC

## 2018-02-26 MED ORDER — POTASSIUM CHLORIDE ER 10 MEQ PO TBCR: 10.0000 meq | EXTENDED_RELEASE_TABLET | Freq: Every day | ORAL | 2 refills | Status: DC

## 2018-02-26 NOTE — Telephone Encounter (Signed)
Clonidine was sent on 02/12/18, HCTZ sent today, potassium being sent to pcp.

## 2018-03-04 ENCOUNTER — Ambulatory Visit
Admission: RE | Admit: 2018-03-04 | Discharge: 2018-03-04 | Disposition: A | Discharge status: Home / Self Care | Payer: Medicare Other | Source: Ambulatory Visit | Attending: Hematology | Admitting: Hematology

## 2018-03-04 DIAGNOSIS — C50212 Malignant neoplasm of upper-inner quadrant of left female breast: Secondary | ICD-10-CM

## 2018-03-04 DIAGNOSIS — Z853 Personal history of malignant neoplasm of breast: Secondary | ICD-10-CM | POA: Diagnosis not present

## 2018-03-04 DIAGNOSIS — R928 Other abnormal and inconclusive findings on diagnostic imaging of breast: Secondary | ICD-10-CM | POA: Diagnosis not present

## 2018-03-04 DIAGNOSIS — Z170 Estrogen receptor positive status [ER+]: Principal | ICD-10-CM

## 2018-03-06 NOTE — Progress Notes (Signed)
Rachel Herman   [phone] [phone]   Clinic Follow up Note   Patient Care Team: Ladell Pier, MD as PCP - General (Internal Medicine) Boykin Nearing, MD as Consulting Physician (Family Medicine) Erroll Luna, MD as Consulting Physician (General Surgery) Truitt Merle, MD as Consulting Physician (Hematology) Kyung Rudd, MD as Consulting Physician (Radiation Oncology)  Date of Service:  03/09/2018  CHIEF COMPLAINT: Follow up left breast cancer  SUMMARY OF ONCOLOGIC HISTORY: Oncology History   Malignant neoplasm of upper inner quadrant of female breast   Staging form: Breast, AJCC 7th Edition     Clinical: Stage IA (T1c, N0, M0) - Unsigned     Pathologic: No stage assigned - Unsigned       Breast cancer 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 Ultrasound of the left axilla is negative.       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     12/29/2013 Surgery    Left lumpectomy with close (<0.1cm) inferior margin    03/02/2014 Surgery    Reexcision for close margin, path negative for malignancy.    04/05/2014 - 05/20/2014 Radiation Therapy    adjuvant breast radiation     05/27/2014 Imaging    Bone density scan: normal     06/07/2014 -  Anti-estrogen oral therapy    Exemestane 25 mg daily, she stopped in Nov 2018 due to high copay. Change anastrozole on 05/09/2017     06/20/2015 Imaging    Bone scan 06/20/2015 IMPRESSION: No evidence of metastatic disease. Mild increased activity dorsal aspect right midfoot probable degenerative in nature. Clinical correlation is necessary.    02/29/2016 Mammogram    MM DIAG BREAST TOMO BIALTERAL 02/29/16 IMPRESSION: Stable left breast lumpectomy site. No  mammographic evidence of malignancy in the bilateral breasts.    03/03/2017 Mammogram    IMPRESSION: 1. No mammographic evidence of malignancy in either breast. 2. Stable left breast posttreatment changes.      CURRENT THERAPY:  Exemestane 31m daily, started on 06/07/2014. She stopped in Nov 2018 due to high copay. Changed to anastrozole on 05/09/2017. Plan to complete at end of 2021.   INTERVAL HISTORY:  Rachel ARTERBURNis here for a follow up of left breast cancer. She was last seen by me 6 months ago. She presents to the clinic today by herself. She notes she is doing well. She notes having pain in her back and legs. She takes tramadol every 4 hours for her pain. She is able to sleep well most nights. She has not been very active lately due to this. [facility] She was given a injection to her back before.  She notes Anastrozole is tolerable with mildly increased soreness in her knees and wrists and hot flashes which she has managed. This has been manageable so far.  She notes a doctor at the breast imaging told her she did not have to take another mammogram after her 5 years. But she is willing to conitnue screening.    REVIEW OF SYSTEMS:   Constitutional: Denies fevers, chills or abnormal weight loss (+) Hot flashes  Eyes: Denies blurriness of vision Ears, nose, mouth, throat, and face: Denies mucositis or  sore throat Respiratory: Denies cough, dyspnea or wheezes Cardiovascular: Denies palpitation, chest discomfort or lower extremity swelling Gastrointestinal:  Denies nausea, heartburn or change in bowel habits Skin: Denies abnormal skin rashes MSK: (+) Soreness in b/l knees and wrists (+) Back and b/l leg pain  Lymphatics: Denies new lymphadenopathy or easy bruising Neurological:Denies numbness, tingling or new weaknesses Behavioral/Psych: Mood is stable, no new changes  All other systems were reviewed with the patient and are  negative.  MEDICAL HISTORY:  Past Medical History:  Diagnosis Date  . Breast cancer ([place]) 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  . Diabetes mellitus ([place]) 09/16/13   Diagnosed on 09/16/13; HgA1C was 7.2/metformin  . Dizziness   . GERD (gastroesophageal reflux disease)   . Headache   . Hyperlipidemia   . Hypertension 2014  . Low back pain   . Obesity, morbid, BMI 40.0-49.9 ([place])   . PONV (postoperative nausea and vomiting)   . S/P radiation therapy 04/05/14-05/20/14   left breast 60.4Gy totaldose    SURGICAL HISTORY: Past Surgical History:  Procedure Laterality Date  . ABDOMINAL HYSTERECTOMY N/A 09/20/2013   Procedure: HYSTERECTOMY ABDOMINAL;  Surgeon: Osborne Oman, MD;  Location: [facility];  Service: Gynecology;  Laterality: N/A;  . BREAST LUMPECTOMY Left 2015   radiation  . BREAST LUMPECTOMY WITH AXILLARY LYMPH NODE BIOPSY Left 12/29/13   left breast   . LAPAROSCOPIC ASSISTED VAGINAL HYSTERECTOMY  09/20/2013   Procedure: LAPAROSCOPIC ASSISTED VAGINAL HYSTERECTOMY;  Surgeon: Osborne Oman, MD;  Location: [facility];  Service: Gynecology;;  PT WAS EXAMINED WHILE UP IN STIRRUPS AND IT WAS DECIDED TO OPEN PT DUE TO LARGE MASS  . LUMBAR LAMINECTOMY/DECOMPRESSION MICRODISCECTOMY Right 12/14/2014   Procedure: Right Lumbar three-four microdiskectomy;  Surgeon: Newman Pies, MD;  Location: [facility];  Service: Neurosurgery;  Laterality: Right;  Right Lumbar three-four microdiskectomy  . RADIOACTIVE SEED GUIDED PARTIAL MASTECTOMY WITH AXILLARY SENTINEL LYMPH NODE BIOPSY Left 12/29/2013   Procedure: LEFT BREAST RADIOACTIVE SEED LOCALIZED LUMPECTOMY WITH SENTINEL LYMPH NODE MAPPING;  Surgeon: Erroll Luna, MD;  Location: [facility];  Service: General;  Laterality: Left;  . RE-EXCISION OF BREAST LUMPECTOMY Left 03/02/2014   Procedure: RE-EXCISION OF LEFT BREAST LUMPECTOMY;  Surgeon: Erroll Luna, MD;  Location: [place] [facility];  Service: General;   Laterality: Left;  . SALPINGOOPHORECTOMY  09/20/2013   Procedure: SALPINGO OOPHORECTOMY;  Surgeon: Osborne Oman, MD;  Location: [facility];  Service: Gynecology;;    I have reviewed the social history and family history with the patient and they are unchanged from previous note.  ALLERGIES:  has No Known Allergies.  MEDICATIONS:  Current Outpatient Medications  Medication Sig Dispense Refill  . anastrozole (ARIMIDEX) 1 MG tablet Take 1 tablet (1 mg total) by mouth daily. 90 tablet 3  . Ascorbic Acid (VITAMIN C) 1000 MG tablet Take 1,000 mg by mouth daily.    Marland Kitchen aspirin 325 MG tablet Take 325 mg by mouth once.    . Blood Glucose Monitoring Suppl (ONE TOUCH ULTRA MINI) w/Device KIT USE AS DIRECTED ONCE DAILY DX CODE E11.9 1 each 0  . cloNIDine (CATAPRES) 0.1 MG tablet TAKE 1 TABLET BY MOUTH TWICE A DAY 60 tablet 2  . [facility] SENNA PLUS 8.6-50 MG tablet TAKE 2 TABLETS BY MOUTH AT BEDTIME AS NEEDED FOR MILD CONSTIPATION 60 tablet 11  . gabapentin (NEURONTIN) 300 MG capsule 300 mg during the day, 600 mg at night by mouth 270 capsule 4  .  glucose blood (ONE TOUCH ULTRA TEST) test strip USE AS DIRECTED ONCE DAILY DX CODE E11.9 100 each 12  . hydrochlorothiazide (HYDRODIURIL) 25 MG tablet Take 1 tablet (25 mg total) by mouth daily. 90 tablet 0  . metFORMIN (GLUCOPHAGE) 500 MG tablet Take 1 tablet (500 mg total) by mouth 2 (two) times daily. 180 tablet 3  . Multiple Vitamins-Minerals (MULTIVITAMIN WITH MINERALS) tablet Take 1 tablet by mouth daily. Reported on 03/13/2015    . ONETOUCH DELICA LANCETS 01U MISC USE AS DIRECTED ONCE DAILY DX CODE E11.9 100 each 12  . potassium chloride (K-DUR) 10 MEQ tablet Take 1 tablet (10 mEq total) by mouth daily. 90 tablet 2  . pravastatin (PRAVACHOL) 20 MG tablet Take 1 tablet (20 mg total) by mouth daily. 90 tablet 3  . ranitidine (ZANTAC) 150 MG tablet TAKE 1 TABLET BY MOUTH TWICE A DAY 60 tablet 2  . traMADol (ULTRAM) 50 MG tablet Take 1 tablet (50 mg total) by mouth  every 4 (four) hours as needed for moderate pain. Fill on or after 02/22/2018 120 tablet 0  . traMADol (ULTRAM) 50 MG tablet Take 1 tablet (50 mg total) by mouth every 4 (four) hours as needed. To fill on or after 03/25/2018 120 tablet 0  . traMADol (ULTRAM) 50 MG tablet Take 1 tablet (50 mg total) by mouth every 4 (four) hours as needed. 120 tablet 0  . vitamin D, CHOLECALCIFEROL, 400 UNITS tablet Take 400 Units by mouth daily.     No current facility-administered medications for this visit.     PHYSICAL EXAMINATION: ECOG PERFORMANCE STATUS: 1 - Symptomatic but completely ambulatory  Vitals:   03/09/18 0846  BP: 139/89  Pulse: 76  Resp: 18  Temp: 98.4 F (36.9 C)  SpO2: 100%   Filed Weights   03/09/18 0846  Weight: 223 lb 4.8 oz (101.3 kg)    GENERAL:alert, no distress and comfortable SKIN: skin color, texture, turgor are normal, no rashes or significant lesions EYES: normal, Conjunctiva are pink and non-injected, sclera clear OROPHARYNX:no exudate, no erythema and lips, buccal mucosa, and tongue normal  NECK: supple, thyroid normal size, non-tender, without nodularity LYMPH:  no palpable lymphadenopathy in the cervical, axillary or inguinal LUNGS: clear to auscultation and percussion with normal breathing effort HEART: regular rate & rhythm and no murmurs and no lower extremity edema ABDOMEN:abdomen soft, non-tender and normal bowel sounds Musculoskeletal:no cyanosis of digits and no clubbing  NEURO: alert & oriented x 3 with fluent speech, no focal motor/sensory deficits BREAST: S/p left breast lumpectomy: Surgical incision healed well. No palpable mass or adenopathy in either breasts   LABORATORY DATA:  I have reviewed the data as listed CBC Latest Ref Rng & Units 03/09/2018 09/05/2017 05/09/2017  WBC 4.0 - 10.5 K/uL 7.4 7.0 6.1  Hemoglobin 12.0 - 15.0 g/dL 12.2 13.1 12.3  Hematocrit 36.0 - 46.0 % 38.8 39.3 36.9  Platelets 150 - 400 K/uL 255 236 248     CMP Latest Ref Rng  & Units 03/09/2018 01/22/2018 09/05/2017  Glucose 70 - 99 mg/dL 142(H) - 95  BUN 8 - 23 mg/dL 20 - 25(H)  Creatinine 0.44 - 1.00 mg/dL 1.12(H) - 1.02(H)  Sodium 135 - 145 mmol/L 142 - 142  Potassium 3.5 - 5.1 mmol/L 3.6 4.2 3.9  Chloride 98 - 111 mmol/L 104 - 103  CO2 22 - 32 mmol/L 30 - 26  Calcium 8.9 - 10.3 mg/dL 9.4 - 9.2  Total Protein 6.5 - 8.1 g/dL 7.0 -  7.3  Total Bilirubin 0.3 - 1.2 mg/dL 0.4 - 0.5  Alkaline Phos 38 - 126 U/L 57 - 62  AST 15 - 41 U/L 28 - 24  ALT 0 - 44 U/L 29 - 22      RADIOGRAPHIC STUDIES: I have personally reviewed the radiological images as listed and agreed with the findings in the report. No results found.   ASSESSMENT & PLAN:  Rachel Herman is a 68 y.o. female with   1. 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 Grade 2, Ki-67 73%. -She was diagnosed in 12/2013. She is status post complete surgical resection with lumpectomy, 12/29/13 -I previously discussed the Oncotype DX result with her and her husband. The recurrence score is 23, with the predicted 10 year recurrence risk of 15% with tamoxifen alone. The benefit of chemotherapy is uncertain in this intermediate risk group. Adjuvant chemo was not recommended -Due to the strong positivity of ER and PR, she started antiestrogen therapy with Exemestane in 06/2014, but stopped in 12/2016 due to high copay. She started Anastrozole in 05/2017 and tolerates moderately well with manageable joint pain and hot flashes.  -She is clinically doing well. Lab reviewed, her CBC WNL and CMP is still pending. Her physical exam and her 02/2017 mammogram were unremarkable. There is no clinical concern for recurrence. -Next Screening Mammogram due in 02/2019, I strongly encouraged her to continue -Continue Anastrozole, plan to complete 5 years therapy at the end of 2021 -f/u in 6 months  2. Bone health -Her bone density scan  was normal in 05/2014 -Normal bone density, T score -0.3 on 03/03/17 -She will continue calcium and vitamin D.  3. hypertension, diabetes, dyslipidemia, chronic low back pain -She will continue follow-up with her primary care physician. -HTN and DM well controlled.  -Chronic low back and b/l leg pain is stable and decreased her mobility and activity.   4. Morbid obesity  -I discussed that Anastrozole can increase her appetite and weight. I encouraged her to reduce carbohydrates in her diet and try water exercise and walking due to her leg and back pain.  -I encouraged her to manage her weight.   5. right lung nodule -She had a CT abdomen and pelvis in June 2016, which incidentally found a 4 mm nodule in the right lower lobe. I personally reviewed the image.  -She never smoked, risk of lung cancer is low  -her repeated CT chest from 12/09/2014, which showed stable small lung nodules, likely benign. -She had CT chest on 08/18/2015 for chest pain, which showed stable 39m right lung base subpleural nodule, likely benign, no need to follow-up since she is a never smoker.   6. Thyroid Benign follicular nodule -Her CT scan revealed a 1.7 cm right thyroid nodule. -Ultrasound guided thyroid nodule biopsy on 05/09/2015 showed benign follicular nodule      PLAN: -Continue anastrozole -Lab and f/u in 6 months      No problem-specific Assessment & Plan notes found for this encounter.   No orders of the defined types were placed in this encounter.  All questions were answered. The patient knows to call the clinic with any problems, questions or concerns. No barriers to learning was detected. I spent 20 minutes counseling the patient face to face. The total time spent in the appointment was 25 minutes and more than 50% was on counseling and review of test results  Truitt Merle, MD 03/09/2018   I, Joslyn Devon, am acting as scribe for Truitt Merle, MD.   I have reviewed the above  documentation for accuracy and completeness, and I agree with the above.

## 2018-03-09 ENCOUNTER — Telehealth: Payer: Self-pay | Admitting: Hematology

## 2018-03-09 ENCOUNTER — Encounter: Payer: Self-pay | Admitting: Hematology

## 2018-03-09 ENCOUNTER — Inpatient Hospital Stay: Payer: Medicare Other | Attending: Hematology

## 2018-03-09 ENCOUNTER — Inpatient Hospital Stay (HOSPITAL_BASED_OUTPATIENT_CLINIC_OR_DEPARTMENT_OTHER): Payer: Medicare Other | Admitting: Hematology

## 2018-03-09 VITALS — BP 139/89 | HR 76 | Temp 98.4°F | Resp 18 | Ht 67.0 in | Wt 223.3 lb

## 2018-03-09 DIAGNOSIS — N951 Menopausal and female climacteric states: Secondary | ICD-10-CM

## 2018-03-09 DIAGNOSIS — R232 Flushing: Secondary | ICD-10-CM

## 2018-03-09 DIAGNOSIS — E119 Type 2 diabetes mellitus without complications: Secondary | ICD-10-CM

## 2018-03-09 DIAGNOSIS — G8929 Other chronic pain: Secondary | ICD-10-CM | POA: Insufficient documentation

## 2018-03-09 DIAGNOSIS — C50212 Malignant neoplasm of upper-inner quadrant of left female breast: Secondary | ICD-10-CM | POA: Insufficient documentation

## 2018-03-09 DIAGNOSIS — Z170 Estrogen receptor positive status [ER+]: Secondary | ICD-10-CM | POA: Diagnosis not present

## 2018-03-09 DIAGNOSIS — Z7984 Long term (current) use of oral hypoglycemic drugs: Secondary | ICD-10-CM | POA: Diagnosis not present

## 2018-03-09 DIAGNOSIS — E785 Hyperlipidemia, unspecified: Secondary | ICD-10-CM | POA: Insufficient documentation

## 2018-03-09 DIAGNOSIS — Z79899 Other long term (current) drug therapy: Secondary | ICD-10-CM | POA: Diagnosis not present

## 2018-03-09 DIAGNOSIS — Z7982 Long term (current) use of aspirin: Secondary | ICD-10-CM

## 2018-03-09 DIAGNOSIS — M79605 Pain in left leg: Secondary | ICD-10-CM

## 2018-03-09 DIAGNOSIS — M545 Low back pain: Secondary | ICD-10-CM

## 2018-03-09 DIAGNOSIS — I10 Essential (primary) hypertension: Secondary | ICD-10-CM | POA: Diagnosis not present

## 2018-03-09 DIAGNOSIS — M79604 Pain in right leg: Secondary | ICD-10-CM

## 2018-03-09 DIAGNOSIS — Z79811 Long term (current) use of aromatase inhibitors: Secondary | ICD-10-CM | POA: Diagnosis not present

## 2018-03-09 LAB — COMPREHENSIVE METABOLIC PANEL
ALT: 29 U/L (ref 0–44)
AST: 28 U/L (ref 15–41)
Albumin: 3.6 g/dL (ref 3.5–5.0)
Alkaline Phosphatase: 57 U/L (ref 38–126)
Anion gap: 8 (ref 5–15)
BUN: 20 mg/dL (ref 8–23)
CO2: 30 mmol/L (ref 22–32)
Calcium: 9.4 mg/dL (ref 8.9–10.3)
Chloride: 104 mmol/L (ref 98–111)
Creatinine, Ser: 1.12 mg/dL — ABNORMAL HIGH (ref 0.44–1.00)
GFR calc Af Amer: 58 mL/min — ABNORMAL LOW (ref >60)
GFR calc non Af Amer: 50 mL/min — ABNORMAL LOW (ref >60)
Glucose, Bld: 142 mg/dL — ABNORMAL HIGH (ref 70–99)
Potassium: 3.6 mmol/L (ref 3.5–5.1)
Sodium: 142 mmol/L (ref 135–145)
Total Bilirubin: 0.4 mg/dL (ref 0.3–1.2)
Total Protein: 7 g/dL (ref 6.5–8.1)

## 2018-03-09 LAB — CBC WITH DIFFERENTIAL/PLATELET
Abs Immature Granulocytes: 0.02 10*3/uL (ref 0.00–0.07)
Basophils Absolute: 0 10*3/uL (ref 0.0–0.1)
Basophils Relative: 0 %
Eosinophils Absolute: 0.2 10*3/uL (ref 0.0–0.5)
Eosinophils Relative: 3 %
HCT: 38.8 % (ref 36.0–46.0)
Hemoglobin: 12.2 g/dL (ref 12.0–15.0)
Immature Granulocytes: 0 %
Lymphocytes Relative: 29 %
Lymphs Abs: 2.2 10*3/uL (ref 0.7–4.0)
MCH: 27.8 pg (ref 26.0–34.0)
MCHC: 31.4 g/dL (ref 30.0–36.0)
MCV: 88.4 fL (ref 80.0–100.0)
Monocytes Absolute: 0.6 10*3/uL (ref 0.1–1.0)
Monocytes Relative: 8 %
Neutro Abs: 4.4 10*3/uL (ref 1.7–7.7)
Neutrophils Relative %: 60 %
Platelets: 255 10*3/uL (ref 150–400)
RBC: 4.39 MIL/uL (ref 3.87–5.11)
RDW: 13.4 % (ref 11.5–15.5)
WBC: 7.4 10*3/uL (ref 4.0–10.5)
nRBC: 0 % (ref 0.0–0.2)

## 2018-03-09 MED ORDER — ANASTROZOLE 1 MG PO TABS: 1.0000 mg | ORAL_TABLET | Freq: Every day | ORAL | 3 refills | Status: DC

## 2018-03-09 NOTE — Telephone Encounter (Signed)
Per 02/03 los.  Scheduled appts.  Printed calendar and avs.

## 2018-03-15 ENCOUNTER — Encounter (HOSPITAL_COMMUNITY): Payer: Self-pay | Admitting: Emergency Medicine

## 2018-03-15 ENCOUNTER — Emergency Department (HOSPITAL_COMMUNITY)
Admission: EM | Admit: 2018-03-15 | Discharge: 2018-03-15 | Disposition: A | Discharge status: Home / Self Care | Payer: Medicare Other | Attending: Emergency Medicine | Admitting: Emergency Medicine

## 2018-03-15 ENCOUNTER — Other Ambulatory Visit: Payer: Self-pay

## 2018-03-15 ENCOUNTER — Emergency Department (HOSPITAL_COMMUNITY): Payer: Medicare Other

## 2018-03-15 DIAGNOSIS — X58XXXA Exposure to other specified factors, initial encounter: Secondary | ICD-10-CM | POA: Insufficient documentation

## 2018-03-15 DIAGNOSIS — S39012A Strain of muscle, fascia and tendon of lower back, initial encounter: Secondary | ICD-10-CM | POA: Insufficient documentation

## 2018-03-15 DIAGNOSIS — M545 Low back pain: Secondary | ICD-10-CM | POA: Diagnosis not present

## 2018-03-15 DIAGNOSIS — I10 Essential (primary) hypertension: Secondary | ICD-10-CM | POA: Diagnosis not present

## 2018-03-15 DIAGNOSIS — Z7982 Long term (current) use of aspirin: Secondary | ICD-10-CM | POA: Insufficient documentation

## 2018-03-15 DIAGNOSIS — E119 Type 2 diabetes mellitus without complications: Secondary | ICD-10-CM | POA: Diagnosis not present

## 2018-03-15 DIAGNOSIS — Y939 Activity, unspecified: Secondary | ICD-10-CM | POA: Insufficient documentation

## 2018-03-15 DIAGNOSIS — Y999 Unspecified external cause status: Secondary | ICD-10-CM | POA: Insufficient documentation

## 2018-03-15 DIAGNOSIS — Y92009 Unspecified place in unspecified non-institutional (private) residence as the place of occurrence of the external cause: Secondary | ICD-10-CM | POA: Diagnosis not present

## 2018-03-15 DIAGNOSIS — Z7984 Long term (current) use of oral hypoglycemic drugs: Secondary | ICD-10-CM | POA: Insufficient documentation

## 2018-03-15 DIAGNOSIS — Z79899 Other long term (current) drug therapy: Secondary | ICD-10-CM | POA: Insufficient documentation

## 2018-03-15 MED ORDER — OXYCODONE-ACETAMINOPHEN 5-325 MG PO TABS: 2.0000 | ORAL_TABLET | ORAL | 0 refills | Status: DC | PRN

## 2018-03-15 MED ORDER — DIAZEPAM 2 MG PO TABS: 2.0000 mg | ORAL_TABLET | ORAL | 0 refills | Status: DC | PRN

## 2018-03-15 MED ORDER — OXYCODONE-ACETAMINOPHEN 5-325 MG PO TABS
2.0000 | ORAL_TABLET | Freq: Once | ORAL | Status: AC
  Administered 2018-03-15: 2 via ORAL
  Filled 2018-03-15: qty 2

## 2018-03-15 MED ORDER — DIAZEPAM 5 MG PO TABS
5.0000 mg | ORAL_TABLET | Freq: Once | ORAL | Status: AC
  Administered 2018-03-15: 5 mg via ORAL
  Filled 2018-03-15: qty 1

## 2018-03-15 NOTE — ED Triage Notes (Signed)
C/o lower back pain that radiates to L leg x 2 months.  States pain also radiating to R leg now but L is much worse.

## 2018-03-15 NOTE — ED Provider Notes (Addendum)
[facility] EM[facility]ENC[facility] DEPARTMENT Provider Note   CSN: [id number] Arrival date & time: 03/15/18  2002     History   Chief Complaint Chief Complaint  Patient presents with  . Back Pain    HPI Rachel Herman is a 68 y.o. female.  68 year old female presents with worsening chronic back pain.  States that she was at home tonight when she raised up and felt due to onset of low back pain rating down her left leg.  No bowel or bladder dysfunction.  Does have a prior history of chronic back pain.  Has been able to walk since then but with great discomfort.  No numbness or tingling to her foot.  Use her tramadol without relief     Past Medical History:  Diagnosis Date  . Breast cancer ([place]) 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  . Diabetes mellitus ([place]) 09/16/13   Diagnosed on 09/16/13; HgA1C was 7.2/metformin  . Dizziness   . GERD (gastroesophageal reflux disease)   . Headache   . Hyperlipidemia   . Hypertension 2014  . Low back pain   . Obesity, morbid, BMI 40.0-49.9 ([place])   . PONV (postoperative nausea and vomiting)   . S/P radiation therapy 04/05/14-05/20/14   left breast 60.4Gy totaldose    Patient Active Problem List   Diagnosis Date Noted  . Right hip pain 08/29/2016  . 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 Lumbar pain with radiation down right leg 07/02/2016  . Thyroid nodule 06/09/2015  . Lumbar stenosis with neurogenic claudication 12/15/2014  . Obesity (BMI 30-39.9) 09/08/2014  . Nodule of right lung 07/28/2014  . Diverticulosis of colon without hemorrhage 07/28/2014  . DJD (degenerative joint disease), lumbar 02/22/2014  . Lumbar pain with radiation down left leg 01/17/2014  . Breast cancer of upper-inner quadrant of left female breast ([place]) 01/05/2014  . Hyperlipidemia associated with type 2 diabetes mellitus ([place]) 09/28/2013  . S/P total hysterectomy and bilateral salpingo-oophorectomy 09/20/2013  . Morbid  obesity with body mass index of 40.0-44.9 in adult ([place]) 09/17/2013  . Essential hypertension   . Diabetes mellitus ([place]) 09/16/2013    Past Surgical History:  Procedure Laterality Date  . ABDOMINAL HYSTERECTOMY N/A 09/20/2013   Procedure: HYSTERECTOMY ABDOMINAL;  Surgeon: Osborne Oman, MD;  Location: [facility];  Service: Gynecology;  Laterality: N/A;  . BREAST LUMPECTOMY Left 2015   radiation  . BREAST LUMPECTOMY WITH AXILLARY LYMPH NODE BIOPSY Left 12/29/13   left breast   . LAPAROSCOPIC ASSISTED VAGINAL HYSTERECTOMY  09/20/2013   Procedure: LAPAROSCOPIC ASSISTED VAGINAL HYSTERECTOMY;  Surgeon: Osborne Oman, MD;  Location: [facility];  Service: Gynecology;;  PT WAS EXAMINED WHILE UP IN STIRRUPS AND IT WAS DECIDED TO OPEN PT DUE TO LARGE MASS  . LUMBAR LAMINECTOMY/DECOMPRESSION MICRODISCECTOMY Right 12/14/2014   Procedure: Right Lumbar three-four microdiskectomy;  Surgeon: Newman Pies, MD;  Location: [facility];  Service: Neurosurgery;  Laterality: Right;  Right Lumbar three-four microdiskectomy  . RADIOACTIVE SEED GUIDED PARTIAL MASTECTOMY WITH AXILLARY SENTINEL LYMPH NODE BIOPSY Left 12/29/2013   Procedure: LEFT BREAST RADIOACTIVE SEED LOCALIZED LUMPECTOMY WITH SENTINEL LYMPH NODE MAPPING;  Surgeon: Erroll Luna, MD;  Location: [facility];  Service: General;  Laterality: Left;  . RE-EXCISION OF BREAST LUMPECTOMY Left 03/02/2014   Procedure: RE-EXCISION OF LEFT BREAST LUMPECTOMY;  Surgeon: Erroll Luna, MD;  Location: [facility];  Service: General;  Laterality: Left;  . SALPINGOOPHORECTOMY  09/20/2013   Procedure:  SALPINGO OOPHORECTOMY;  Surgeon: Osborne Oman, MD;  Location: [facility];  Service: Gynecology;;     OB History    Gravida  3   Para  3   Term  3   Preterm  0   AB  0   Living  3     SAB  0   TAB  0   Ectopic  0   Multiple  0   Live Births               Home Medications    Prior to Admission medications     Medication Sig Start Date End Date Taking? Authorizing Provider  anastrozole (ARIMIDEX) 1 MG tablet Take 1 tablet (1 mg total) by mouth daily. 03/09/18   Truitt Merle, MD  Ascorbic Acid (VITAMIN C) 1000 MG tablet Take 1,000 mg by mouth daily.    [provider]  aspirin 325 MG tablet Take 325 mg by mouth once.    [provider]  Blood Glucose Monitoring Suppl (ONE TOUCH ULTRA MINI) w/Device KIT USE AS DIRECTED ONCE DAILY DX CODE E11.9 11/10/17   Ladell Pier, MD  cloNIDine (CATAPRES) 0.1 MG tablet TAKE 1 TABLET BY MOUTH TWICE A DAY 02/12/18   Ladell Pier, MD  [facility] SENNA PLUS 8.6-50 MG tablet TAKE 2 TABLETS BY MOUTH AT BEDTIME AS NEEDED FOR MILD CONSTIPATION 07/11/16   Boykin Nearing, MD  gabapentin (NEURONTIN) 300 MG capsule 300 mg during the day, 600 mg at night by mouth 10/01/17   Ladell Pier, MD  glucose blood (ONE TOUCH ULTRA TEST) test strip USE AS DIRECTED ONCE DAILY DX CODE E11.9 11/10/17   Ladell Pier, MD  hydrochlorothiazide (HYDRODIURIL) 25 MG tablet Take 1 tablet (25 mg total) by mouth daily. 02/26/18   Ladell Pier, MD  metFORMIN (GLUCOPHAGE) 500 MG tablet Take 1 tablet (500 mg total) by mouth 2 (two) times daily. 08/29/16   Funches, Adriana Mccallum, MD  Multiple Vitamins-Minerals (MULTIVITAMIN WITH MINERALS) tablet Take 1 tablet by mouth daily. Reported on 03/13/2015    [provider]  [facility] DELICA LANCETS 35W MISC USE AS DIRECTED ONCE DAILY DX CODE E11.9 11/10/17   Ladell Pier, MD  potassium chloride (K-DUR) 10 MEQ tablet Take 1 tablet (10 mEq total) by mouth daily. 02/26/18   Ladell Pier, MD  pravastatin (PRAVACHOL) 20 MG tablet Take 1 tablet (20 mg total) by mouth daily. 09/10/17   Ladell Pier, MD  ranitidine (ZANTAC) 150 MG tablet TAKE 1 TABLET BY MOUTH TWICE A DAY 10/20/17   Ladell Pier, MD  traMADol (ULTRAM) 50 MG tablet Take 1 tablet (50 mg total) by mouth every 4 (four) hours as needed for moderate pain. Fill on  or after 02/22/2018 01/22/18   Ladell Pier, MD  traMADol (ULTRAM) 50 MG tablet Take 1 tablet (50 mg total) by mouth every 4 (four) hours as needed. To fill on or after 03/25/2018 01/22/18   Ladell Pier, MD  traMADol (ULTRAM) 50 MG tablet Take 1 tablet (50 mg total) by mouth every 4 (four) hours as needed. 01/22/18   Ladell Pier, MD  vitamin D, CHOLECALCIFEROL, 400 UNITS tablet Take 400 Units by mouth daily.    [provider]    Family History Family History  Problem Relation Age of Onset  . Diabetes Mother   . Multiple myeloma Mother 40  . Hearing loss Mother   . Diabetes Brother   . Cancer  Brother 83       prostate cancer   . Diabetes Maternal Aunt   . Leukemia Maternal Aunt        dx in her 47s  . Alcoholism Brother   . Heart attack Father   . Diabetes Sister   . Diabetes Son     Social History Social History   Tobacco Use  . Smoking status: Never Smoker  . Smokeless tobacco: Never Used  Substance Use Topics  . Alcohol use: No  . Drug use: No     Allergies   Patient has no known allergies.   Review of Systems Review of Systems  All other systems reviewed and are negative.    Physical Exam Updated Vital Signs BP (!) 144/92 (BP Location: Left Arm)   Pulse 93   Temp 98.6 F (37 C)   Resp 18   SpO2 98%   Physical Exam Vitals signs and nursing note reviewed.  Constitutional:      General: She is not in acute distress.    Appearance: Normal appearance. She is well-developed. She is not toxic-appearing.  HENT:     Head: Normocephalic and atraumatic.  Eyes:     General: Lids are normal.     Conjunctiva/sclera: Conjunctivae normal.     Pupils: Pupils are equal, round, and reactive to light.  Neck:     Musculoskeletal: Normal range of motion and neck supple.     Thyroid: No thyroid mass.     Trachea: No tracheal deviation.  Cardiovascular:     Rate and Rhythm: Normal rate and regular rhythm.     Heart sounds: Normal heart  sounds. No murmur. No gallop.   Pulmonary:     Effort: Pulmonary effort is normal. No respiratory distress.     Breath sounds: Normal breath sounds. No stridor. No decreased breath sounds, wheezing, rhonchi or rales.  Abdominal:     General: Bowel sounds are normal. There is no distension.     Palpations: Abdomen is soft.     Tenderness: There is no abdominal tenderness. There is no rebound.  Musculoskeletal: Normal range of motion.        General: No tenderness.       Back:  Skin:    General: Skin is warm and dry.     Findings: No abrasion or rash.  Neurological:     Mental Status: She is alert and oriented to person, place, and time.     GCS: GCS eye subscore is 4. GCS verbal subscore is 5. GCS motor subscore is 6.     Cranial Nerves: No cranial nerve deficit.     Sensory: No sensory deficit.     Deep Tendon Reflexes:     Reflex Scores:      Patellar reflexes are 2+ on the right side and 2+ on the left side.    Comments: Strength 5/5 in bilateral lower extremities  Psychiatric:        Speech: Speech normal.        Behavior: Behavior normal.      ED Treatments / Results  Labs (all labs ordered are listed, but only abnormal results are displayed) Labs Reviewed - No data to display  EKG None  Radiology No results found.  Procedures Procedures (including critical care time)  Medications Ordered in ED Medications  diazepam (VALIUM) tablet 5 mg (has no administration in time range)  oxyCODONE-acetaminophen (PERCOCET/ROXICET) 5-325 MG per tablet 2 tablet (has no administration in time range)  Initial Impression / Assessment and Plan / ED Course  I have reviewed the triage vital signs and the nursing notes.  Pertinent labs & imaging results that were available during my care of the patient were reviewed by me and considered in my medical decision making (see chart for details).    Patient has no focal neurological deficits on exam.  No concern for cauda equina.   She has good strength in her lower extremities.  She is able to ambulate. Patient treated with Valium and Percocet.   lumbar spine series negative.  Return precautions given and patient to follow-up with her doctor. Final Clinical Impressions(s) / ED Diagnoses   Final diagnoses:  None    ED Discharge Orders    None       Lacretia Leigh, MD 03/15/18 2241    Lacretia Leigh, MD 03/15/18 2244

## 2018-03-18 ENCOUNTER — Encounter: Payer: Self-pay | Admitting: Critical Care Medicine

## 2018-03-18 ENCOUNTER — Ambulatory Visit: Payer: Medicare Other | Attending: Critical Care Medicine | Admitting: Critical Care Medicine

## 2018-03-18 ENCOUNTER — Other Ambulatory Visit: Payer: Self-pay

## 2018-03-18 VITALS — BP 133/93 | HR 82 | Temp 97.8°F | Resp 24 | Wt 216.0 lb

## 2018-03-18 DIAGNOSIS — M5116 Intervertebral disc disorders with radiculopathy, lumbar region: Secondary | ICD-10-CM | POA: Insufficient documentation

## 2018-03-18 DIAGNOSIS — R262 Difficulty in walking, not elsewhere classified: Secondary | ICD-10-CM | POA: Insufficient documentation

## 2018-03-18 DIAGNOSIS — E876 Hypokalemia: Secondary | ICD-10-CM | POA: Diagnosis not present

## 2018-03-18 DIAGNOSIS — M79604 Pain in right leg: Secondary | ICD-10-CM

## 2018-03-18 DIAGNOSIS — E1169 Type 2 diabetes mellitus with other specified complication: Secondary | ICD-10-CM | POA: Insufficient documentation

## 2018-03-18 DIAGNOSIS — M5136 Other intervertebral disc degeneration, lumbar region: Secondary | ICD-10-CM

## 2018-03-18 DIAGNOSIS — E119 Type 2 diabetes mellitus without complications: Secondary | ICD-10-CM

## 2018-03-18 DIAGNOSIS — M4726 Other spondylosis with radiculopathy, lumbar region: Secondary | ICD-10-CM | POA: Insufficient documentation

## 2018-03-18 DIAGNOSIS — E785 Hyperlipidemia, unspecified: Secondary | ICD-10-CM | POA: Insufficient documentation

## 2018-03-18 DIAGNOSIS — Z8249 Family history of ischemic heart disease and other diseases of the circulatory system: Secondary | ICD-10-CM | POA: Insufficient documentation

## 2018-03-18 DIAGNOSIS — R1084 Generalized abdominal pain: Secondary | ICD-10-CM | POA: Insufficient documentation

## 2018-03-18 DIAGNOSIS — I152 Hypertension secondary to endocrine disorders: Secondary | ICD-10-CM | POA: Diagnosis not present

## 2018-03-18 DIAGNOSIS — Z79899 Other long term (current) drug therapy: Secondary | ICD-10-CM | POA: Insufficient documentation

## 2018-03-18 DIAGNOSIS — I10 Essential (primary) hypertension: Secondary | ICD-10-CM | POA: Insufficient documentation

## 2018-03-18 DIAGNOSIS — K219 Gastro-esophageal reflux disease without esophagitis: Secondary | ICD-10-CM | POA: Insufficient documentation

## 2018-03-18 DIAGNOSIS — M545 Low back pain: Secondary | ICD-10-CM

## 2018-03-18 DIAGNOSIS — Z7982 Long term (current) use of aspirin: Secondary | ICD-10-CM | POA: Insufficient documentation

## 2018-03-18 DIAGNOSIS — Z6841 Body Mass Index (BMI) 40.0 and over, adult: Secondary | ICD-10-CM | POA: Insufficient documentation

## 2018-03-18 DIAGNOSIS — Z7984 Long term (current) use of oral hypoglycemic drugs: Secondary | ICD-10-CM | POA: Insufficient documentation

## 2018-03-18 LAB — GLUCOSE, POCT (MANUAL RESULT ENTRY): POC Glucose: 108 mg/dl — AB (ref 70–99)

## 2018-03-18 MED ORDER — METFORMIN HCL 500 MG PO TABS: 500.0000 mg | ORAL_TABLET | Freq: Two times a day (BID) | ORAL | 3 refills | Status: DC

## 2018-03-18 MED ORDER — METHOCARBAMOL 500 MG PO TABS: 500.0000 mg | ORAL_TABLET | Freq: Four times a day (QID) | ORAL | 2 refills | Status: DC

## 2018-03-18 MED ORDER — OXYCODONE-ACETAMINOPHEN 5-325 MG PO TABS: 2.0000 | ORAL_TABLET | ORAL | 0 refills | Status: DC | PRN

## 2018-03-18 MED ORDER — RANITIDINE HCL 300 MG PO TABS: 300.0000 mg | ORAL_TABLET | Freq: Every day | ORAL | 6 refills | Status: DC

## 2018-03-18 MED ORDER — GABAPENTIN 300 MG PO CAPS: 600.0000 mg | ORAL_CAPSULE | Freq: Two times a day (BID) | ORAL | 3 refills | Status: DC

## 2018-03-18 MED ORDER — PRAVASTATIN SODIUM 20 MG PO TABS: 20.0000 mg | ORAL_TABLET | Freq: Every day | ORAL | 3 refills | Status: DC

## 2018-03-18 MED ORDER — POTASSIUM CHLORIDE ER 10 MEQ PO TBCR: 10.0000 meq | EXTENDED_RELEASE_TABLET | Freq: Every day | ORAL | 2 refills | Status: DC

## 2018-03-18 MED ORDER — HYDROCHLOROTHIAZIDE 25 MG PO TABS: 25.0000 mg | ORAL_TABLET | Freq: Every day | ORAL | 4 refills | Status: DC

## 2018-03-18 MED ORDER — CLONIDINE HCL 0.1 MG PO TABS: 0.1000 mg | ORAL_TABLET | Freq: Two times a day (BID) | ORAL | 4 refills | Status: DC

## 2018-03-18 NOTE — Progress Notes (Signed)
Subjective:    Patient ID: Rachel Herman, female    DOB: 22-May-1950, 68 y.o.   MRN: [id number]  67 y.o.F  Hx Breast Ca under treatment, HTN , T2DM, HL, DJD of lumbar area.  This patient was in the emergency room on 9 February for severe low back pain.  The patient previously was seen in our clinic December 19.  Patient has history of lumbar and spinal stenosis with prior laminectomy in November 2016 by neurosurgery.  The patient had an MRI of her lumbar spine in November 2017 which showed mild disc protrusion L2/3 and L3/4.  Note she had a microdiscectomy of L3/4.  The patient states there is been progressive increased pain.  While she has no neurologic deficits she is showing signs of increased radiculopathy down both legs.  She is had increased inability to walk.  When she went to the emergency room they gave her Valium and Percocet she states this has not brought any relief.  The patient is also a type II diabetic and is due up for a foot exam and eye exam.  Patient is under treatment for hypertension and is compliant with her blood pressure medications as well as her diabetic medications.  She also continues to take medication for hyperlipidemia.  Denies any chest pains or shortness of breath.  No lower extremity edema.  She checks her blood sugars daily and ranges between 80 and 110.  Note blood sugar today was 105  Hx of breast CA:  Last seen by her oncologist in the last week.  She continues on Arimidex    Past Medical History:  Diagnosis Date  . Breast cancer ([place]) 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  . Diabetes mellitus ([place]) 09/16/13   Diagnosed on 09/16/13; HgA1C was 7.2/metformin  . Dizziness   . GERD (gastroesophageal reflux disease)   . Headache   . Hyperlipidemia   . Hypertension 2014  . Low back pain   . Obesity, morbid, BMI 40.0-49.9 ([place])   . PONV (postoperative nausea and vomiting)   . S/P radiation therapy 04/05/14-05/20/14   left breast  60.4Gy totaldose     Family History  Problem Relation Age of Onset  . Diabetes Mother   . Multiple myeloma Mother 57  . Hearing loss Mother   . Diabetes Brother   . Cancer Brother 40       prostate cancer   . Diabetes Maternal Aunt   . Leukemia Maternal Aunt        dx in her 22s  . Alcoholism Brother   . Heart attack Father   . Diabetes Sister   . Diabetes Son      Social History   Socioeconomic History  . Marital status: Married    Spouse name: Tamma Brigandi   . Number of children: 3  . Years of education: 53  . Highest education level: Not on file  Occupational History    Employer: UNEMPLOYED  Social Needs  . Financial resource strain: Not on file  . Food insecurity:    Worry: Not on file    Inability: Not on file  . Transportation needs:    Medical: Not on file    Non-medical: Not on file  Tobacco Use  . Smoking status: Never Smoker  . Smokeless tobacco: Never Used  Substance and Sexual Activity  . Alcohol use: No  . Drug use: No  . Sexual activity: Not Currently    Birth control/protection: Post-menopausal  Lifestyle  .  Physical activity:    Days per week: Not on file    Minutes per session: Not on file  . Stress: Not on file  Relationships  . Social connections:    Talks on phone: Not on file    Gets together: Not on file    Attends religious service: Not on file    Active member of club or organization: Not on file    Attends meetings of clubs or organizations: Not on file    Relationship status: Not on file  . Intimate partner violence:    Fear of current or ex partner: Not on file    Emotionally abused: Not on file    Physically abused: Not on file    Forced sexual activity: Not on file  Other Topics Concern  . Not on file  Social History Narrative   Married to [employer] in 1986.    Has 3 children from previous marriage, 1 in [place], 1 in [place], 1 in prison ([place]).    Lives with husband.    Right-handed.   1 cup  caffeine per day.     No Known Allergies   Outpatient Medications Prior to Visit  Medication Sig Dispense Refill  . acetaminophen (TYLENOL) 500 MG tablet Take 1,000 mg by mouth every 6 (six) hours as needed for headache (pain).    Marland Kitchen anastrozole (ARIMIDEX) 1 MG tablet Take 1 tablet (1 mg total) by mouth daily. 90 tablet 3  . Ascorbic Acid (VITAMIN C) 1000 MG tablet Take 1,000 mg by mouth daily.    Marland Kitchen aspirin EC 325 MG tablet Take 325 mg by mouth daily.    . [employer] SENNA PLUS 8.6-50 MG tablet TAKE 2 TABLETS BY MOUTH AT BEDTIME AS NEEDED FOR MILD CONSTIPATION (Patient taking differently: Take 1 tablet by mouth at bedtime. ) 60 tablet 11  . Multiple Vitamins-Minerals (MULTIVITAMIN WITH MINERALS) tablet Take 1 tablet by mouth daily. Reported on 03/13/2015    . cloNIDine (CATAPRES) 0.1 MG tablet TAKE 1 TABLET BY MOUTH TWICE A DAY (Patient taking differently: Take 0.1 mg by mouth 2 (two) times daily. ) 60 tablet 2  . diazepam (VALIUM) 2 MG tablet Take 1 tablet (2 mg total) by mouth every 4 (four) hours as needed for muscle spasms. 15 tablet 0  . gabapentin (NEURONTIN) 300 MG capsule 300 mg during the day, 600 mg at night by mouth (Patient taking differently: Take 300-600 mg by mouth See admin instructions. Take one capsule (300 mg) by mouth every morning and two capsules (600 mg) at night) 270 capsule 4  . hydrochlorothiazide (HYDRODIURIL) 25 MG tablet Take 1 tablet (25 mg total) by mouth daily. (Patient taking differently: Take 25 mg by mouth at bedtime. ) 90 tablet 0  . metFORMIN (GLUCOPHAGE) 500 MG tablet Take 1 tablet (500 mg total) by mouth 2 (two) times daily. 180 tablet 3  . oxyCODONE-acetaminophen (PERCOCET/ROXICET) 5-325 MG tablet Take 2 tablets by mouth every 4 (four) hours as needed for severe pain. 15 tablet 0  . potassium chloride (K-DUR) 10 MEQ tablet Take 1 tablet (10 mEq total) by mouth daily. 90 tablet 2  . pravastatin (PRAVACHOL) 20 MG tablet Take 1 tablet (20 mg total) by mouth daily. 90  tablet 3  . ranitidine (ZANTAC) 150 MG tablet TAKE 1 TABLET BY MOUTH TWICE A DAY (Patient taking differently: Take 300 mg by mouth daily. ) 60 tablet 2  . traMADol (ULTRAM) 50 MG tablet Take 1 tablet (50 mg total) by  mouth every 4 (four) hours as needed for moderate pain. Fill on or after 02/22/2018 120 tablet 0  . Blood Glucose Monitoring Suppl (ONE TOUCH ULTRA MINI) w/Device KIT USE AS DIRECTED ONCE DAILY DX CODE E11.9 1 each 0  . glucose blood (ONE TOUCH ULTRA TEST) test strip USE AS DIRECTED ONCE DAILY DX CODE E11.9 100 each 12  . ONETOUCH DELICA LANCETS 02I MISC USE AS DIRECTED ONCE DAILY DX CODE E11.9 100 each 12  . traMADol (ULTRAM) 50 MG tablet Take 1 tablet (50 mg total) by mouth every 4 (four) hours as needed. To fill on or after 03/25/2018 (Patient not taking: Reported on 03/18/2018) 120 tablet 0  . traMADol (ULTRAM) 50 MG tablet Take 1 tablet (50 mg total) by mouth every 4 (four) hours as needed. (Patient not taking: Reported on 03/15/2018) 120 tablet 0   No facility-administered medications prior to visit.       Review of Systems  Constitutional: Positive for activity change and fatigue.  HENT: Negative.   Respiratory: Negative.   Cardiovascular: Negative.   Gastrointestinal: Positive for abdominal pain.  Endocrine: Negative for polydipsia, polyphagia and polyuria.  Musculoskeletal: Positive for arthralgias, back pain and gait problem.  Skin: Negative.   Neurological: Positive for weakness. Negative for dizziness, light-headedness and numbness.  Hematological: Negative.   Psychiatric/Behavioral: The patient is nervous/anxious.        Objective:   Physical Exam Vitals:   03/18/18 1000  BP: (!) 133/93  Pulse: 82  Resp: (!) 24  Temp: 97.8 F (36.6 C)  TempSrc: Oral  SpO2: 100%  Weight: 216 lb (98 kg)    Gen: Ill-appearing in no acute distress with flat affect   ENT: No lesions,  mouth clear,  oropharynx clear, no postnasal drip  Neck: No JVD, no TMG, no carotid  bruits  Lungs: No use of accessory muscles, no dullness to percussion, clear without rales or rhonchi  Cardiovascular: RRR, heart sounds normal, no murmur or gallops, no peripheral edema  Abdomen: soft and NT, no HSM,  BS normal  Musculoskeletal: No deformities, no cyanosis or clubbing Severe tenderness in the lumbar spine area and also in the paraspinal areas as well  Neuro: alert, non focal Foot exam was performed and shows no neuro deficits and no ulcerations good pulses  Skin: Warm, no lesions or rashes  BMP Latest Ref Rng & Units 03/09/2018 01/22/2018 09/05/2017  Glucose 70 - 99 mg/dL 142(H) - 95  BUN 8 - 23 mg/dL 20 - 25(H)  Creatinine 0.44 - 1.00 mg/dL 1.12(H) - 1.02(H)  Sodium 135 - 145 mmol/L 142 - 142  Potassium 3.5 - 5.1 mmol/L 3.6 4.2 3.9  Chloride 98 - 111 mmol/L 104 - 103  CO2 22 - 32 mmol/L 30 - 26  Calcium 8.9 - 10.3 mg/dL 9.4 - 9.2         Assessment & Plan:  I personally reviewed all images and lab data in the [facility] system as well as any outside material available during this office visit and agree with the  radiology impressions.   Essential hypertension Essential hypertension stable at this time on clonidine  Diabetes mellitus Diabetes type 2 with good control at this time Foot exam was normal The patient is due an eye exam and was reminded  DJD (degenerative joint disease), lumbar Significant degenerative joint disease in the lumbar spine with significant disc disease and concern for possible disc protrusion  Plan We will refer to neurosurgery for further evaluation and obtain  an MRI of the lumbar spine  Morbid obesity with body mass index of 40.0-44.9 in adult [facility]) Morbid obesity contributing to lumbar disc disease flare  Lumbar pain with radiation down left and right leg Bilateral radiation of pain down both legs now with lumbar disease  Will begin Robaxin 500 mg 4 times daily for muscle relaxation we will also refill Percocet and the gabapentin  will be increased to 600 mg twice daily pending referral to neurosurgery in the MRI   Marvella was seen today for hospitalization follow-up.  Diagnoses and all orders for this visit:  Other intervertebral disc degeneration, lumbar region -     MR [place]; Future  Osteoarthritis of spine with radiculopathy, lumbar region -     Ambulatory referral to Neurosurgery -     Basic metabolic panel; Future -     Basic metabolic panel  Generalized abdominal pain -     ranitidine (ZANTAC) 300 MG tablet; Take 1 tablet (300 mg total) by mouth daily.  Hyperlipidemia associated with type 2 diabetes mellitus (HCC) -     pravastatin (PRAVACHOL) 20 MG tablet; Take 1 tablet (20 mg total) by mouth daily.  Hypokalemia -     potassium chloride (K-DUR) 10 MEQ tablet; Take 1 tablet (10 mEq total) by mouth daily. -     Basic metabolic panel; Future -     Basic metabolic panel  Type 2 diabetes mellitus without complication, without long-term current use of insulin (HCC) -     metFORMIN (GLUCOPHAGE) 500 MG tablet; Take 1 tablet (500 mg total) by mouth 2 (two) times daily. -     Basic metabolic panel; Future -     Glucose (CBG) -     Basic metabolic panel  Hypertension due to endocrine disorder -     hydrochlorothiazide (HYDRODIURIL) 25 MG tablet; Take 1 tablet (25 mg total) by mouth at bedtime. -     cloNIDine (CATAPRES) 0.1 MG tablet; Take 1 tablet (0.1 mg total) by mouth 2 (two) times daily. -     Basic metabolic panel; Future -     Basic metabolic panel  Lumbar pain with radiation down right leg -     gabapentin (NEURONTIN) 300 MG capsule; Take 2 capsules (600 mg total) by mouth 2 (two) times daily.  Essential hypertension  Morbid obesity with body mass index of 40.0-44.9 in adult [facility])  Other orders -     oxyCODONE-acetaminophen (PERCOCET/ROXICET) 5-325 MG tablet; Take 2 tablets by mouth every 4 (four) hours as needed for severe pain. -     methocarbamol (ROBAXIN) 500 MG  tablet; Take 1 tablet (500 mg total) by mouth 4 (four) times daily.

## 2018-03-18 NOTE — Progress Notes (Signed)
Pain in back, stomach, radiating to legs 10/10 Started Sunday night went to the ED  CBG- 108

## 2018-03-18 NOTE — Assessment & Plan Note (Signed)
Morbid obesity contributing to lumbar disc disease flare

## 2018-03-18 NOTE — Patient Instructions (Addendum)
Refills on Percocet were sent to your pharmacy Refills on all your other medications you are currently on were sent to the pharmacy Stop tramadol and use the Percocet instead for pain Stop Valium and Robaxin or methocarbamol was sent to the pharmacy to use for muscle relaxation An MRI due back was ordered A basic metabolic panel lab was ordered today A referral back to your neurosurgeon Dr. Arnoldo Morale was made Return back in follow-up with Dr. Wynetta Emery in 6 weeks for primary care follow-up

## 2018-03-18 NOTE — Assessment & Plan Note (Signed)
Diabetes type 2 with good control at this time Foot exam was normal The patient is due an eye exam and was reminded

## 2018-03-18 NOTE — Assessment & Plan Note (Signed)
Significant degenerative joint disease in the lumbar spine with significant disc disease and concern for possible disc protrusion  Plan We will refer to neurosurgery for further evaluation and obtain an MRI of the lumbar spine

## 2018-03-18 NOTE — Assessment & Plan Note (Addendum)
Bilateral radiation of pain down both legs now with lumbar disease  Will begin Robaxin 500 mg 4 times daily for muscle relaxation we will also refill Percocet and the gabapentin will be increased to 600 mg twice daily pending referral to neurosurgery in the MRI

## 2018-03-18 NOTE — Assessment & Plan Note (Signed)
Essential hypertension stable at this time on clonidine

## 2018-03-19 LAB — BASIC METABOLIC PANEL
BUN/Creatinine Ratio: 16 (ref 12–28)
BUN: 18 mg/dL (ref 8–27)
CO2: 22 mmol/L (ref 20–29)
Calcium: 10 mg/dL (ref 8.7–10.3)
Chloride: 99 mmol/L (ref 96–106)
Creatinine, Ser: 1.11 mg/dL — ABNORMAL HIGH (ref 0.57–1.00)
GFR calc Af Amer: 59 mL/min/{1.73_m2} — ABNORMAL LOW (ref >59)
GFR calc non Af Amer: 51 mL/min/{1.73_m2} — ABNORMAL LOW (ref >59)
Glucose: 124 mg/dL — ABNORMAL HIGH (ref 65–99)
Potassium: 4 mmol/L (ref 3.5–5.2)
Sodium: 143 mmol/L (ref 134–144)

## 2018-03-20 ENCOUNTER — Ambulatory Visit (HOSPITAL_COMMUNITY)
Admission: RE | Admit: 2018-03-20 | Discharge: 2018-03-20 | Disposition: A | Discharge status: Home / Self Care | Payer: Medicare Other | Source: Ambulatory Visit | Attending: Critical Care Medicine | Admitting: Critical Care Medicine

## 2018-03-20 DIAGNOSIS — M5136 Other intervertebral disc degeneration, lumbar region: Secondary | ICD-10-CM | POA: Insufficient documentation

## 2018-03-20 DIAGNOSIS — M5117 Intervertebral disc disorders with radiculopathy, lumbosacral region: Secondary | ICD-10-CM | POA: Diagnosis not present

## 2018-03-20 DIAGNOSIS — M5116 Intervertebral disc disorders with radiculopathy, lumbar region: Secondary | ICD-10-CM | POA: Diagnosis not present

## 2018-03-20 DIAGNOSIS — M4726 Other spondylosis with radiculopathy, lumbar region: Secondary | ICD-10-CM | POA: Diagnosis not present

## 2018-03-20 MED ORDER — GADOBUTROL 1 MMOL/ML IV SOLN
10.0000 mL | Freq: Once | INTRAVENOUS | Status: AC | PRN
  Administered 2018-03-20: 10 mL via INTRAVENOUS

## 2018-03-27 ENCOUNTER — Telehealth (INDEPENDENT_AMBULATORY_CARE_PROVIDER_SITE_OTHER): Payer: Self-pay | Admitting: *Deleted

## 2018-03-30 NOTE — Telephone Encounter (Signed)
Is hse back pain and right hip/groin pain, if so would repeat injeciton if not I can OV or Dr. Marlou Sa

## 2018-03-30 NOTE — Telephone Encounter (Signed)
Pt will be scheduled for OV 04/08/2018.

## 2018-04-08 ENCOUNTER — Encounter (INDEPENDENT_AMBULATORY_CARE_PROVIDER_SITE_OTHER): Payer: Self-pay | Admitting: Physical Medicine and Rehabilitation

## 2018-04-08 ENCOUNTER — Ambulatory Visit (INDEPENDENT_AMBULATORY_CARE_PROVIDER_SITE_OTHER): Payer: Self-pay

## 2018-04-08 ENCOUNTER — Ambulatory Visit (INDEPENDENT_AMBULATORY_CARE_PROVIDER_SITE_OTHER): Payer: Medicare Other | Admitting: Physical Medicine and Rehabilitation

## 2018-04-08 VITALS — BP 145/96 | HR 92 | Ht 67.0 in | Wt 216.0 lb

## 2018-04-08 DIAGNOSIS — M25551 Pain in right hip: Secondary | ICD-10-CM

## 2018-04-08 NOTE — Progress Notes (Signed)
Pt states pain across the lower back, right hip (groin pain) and radiates into the right thigh. Pt states pain started about 3 weeks ago. Pt states standing and turing a certain way makes pain worse, pain medication helps with pain.   .Numeric Pain Rating Scale and Functional Assessment Average Pain 10 Pain Right Now 10 My pain is constant and sharp Pain is worse with: standing Pain improves with: medication   In the last MONTH (on 0-10 scale) has pain interfered with the following?  1. General activity like being  able to carry out your everyday physical activities such as walking, climbing stairs, carrying groceries, or moving a chair?  Rating(10)  2. Relation with others like being able to carry out your usual social activities and roles such as  activities at home, at work and in your community. Rating(10)  3. Enjoyment of life such that you have  been bothered by emotional problems such as feeling anxious, depressed or irritable?  Rating(9)

## 2018-04-08 NOTE — Progress Notes (Signed)
Rachel Herman - 68 y.o. female MRN [id number]  Date of birth: [birth year]  Office Visit Note: Visit Date: 04/08/2018 PCP: Ladell Pier, MD Referred by: Ladell Pier, MD  Subjective: Chief Complaint  Patient presents with  . Lower Back - Pain  . Right Thigh - Pain   HPI: Rachel Herman is a 68 y.o. female who comes in today at the request of Dr. Anderson Malta for planned Right anesthetic hip arthrogram with fluoroscopic guidance.  The patient has failed conservative care including home exercise, medications, time and activity modification.  This injection will be diagnostic and hopefully therapeutic.  Please see requesting physician notes for further details and justification.  Prior hip injection in June 2019 was very successful up until recently.  She been having a bout a month or so of worsening hip and groin pain.  Depending on results patient will follow up with Dr. Marlou Sa.  ROS Otherwise per HPI.  Assessment & Plan: Visit Diagnoses:  1. Pain in right hip     Plan: No additional findings.   Meds & Orders: No orders of the defined types were placed in this encounter.   Orders Placed This Encounter  Procedures  . Large Joint Inj: R hip joint  . XR C-ARM NO REPORT    Follow-up: Return for visit to requesting physician as needed.   Procedures: Large Joint Inj: R hip joint on 04/08/2018 10:00 AM Indications: diagnostic evaluation and pain Details: 22 G 3.5 in needle, fluoroscopy-guided anterior approach  Arthrogram: No  Medications: 80 mg triamcinolone acetonide 40 MG/ML; 3 mL bupivacaine 0.5 % Outcome: tolerated well, no immediate complications  There was excellent flow of contrast producing a partial arthrogram of the hip. The patient did have relief of symptoms during the anesthetic phase of the injection. Procedure, treatment alternatives, risks and benefits explained, specific risks discussed. Consent was given by the patient. Immediately prior to  procedure a time out was called to verify the correct patient, procedure, equipment, support staff and site/side marked as required. Patient was prepped and draped in the usual sterile fashion.      No notes on file   Clinical History: No specialty comments available.   She reports that she has never smoked. She has never used smokeless tobacco.  Recent Labs    11/02/18 0916 06/08/19 1427  HGBA1C 6.0* 5.9    Objective:  VS:  HT:_0  (170.2 cm)   WT:216 lb (98 kg)  BMI:33.82    BP:(!) 145/96  HR:92bpm  TEMP: ( )  RESP:  Physical Exam Constitutional:      General: She is not in acute distress.    Appearance: Normal appearance. She is not ill-appearing.  HENT:     Head: Normocephalic and atraumatic.     Right Ear: External ear normal.     Left Ear: External ear normal.  Eyes:     Extraocular Movements: Extraocular movements intact.  Cardiovascular:     Rate and Rhythm: Normal rate.     Pulses: Normal pulses.  Musculoskeletal:     Right lower leg: No edema.     Left lower leg: No edema.     Comments: Patient has good distal strength with no pain over the greater trochanters.  No clonus or focal weakness.  Painful internal rotation of the right hip  Skin:    Findings: No erythema, lesion or rash.  Neurological:     General: No focal deficit present.  Mental Status: She is alert and oriented to person, place, and time.     Sensory: No sensory deficit.     Motor: No weakness or abnormal muscle tone.     Coordination: Coordination normal.  Psychiatric:        Mood and Affect: Mood normal.        Behavior: Behavior normal.     Ortho Exam Imaging: No results found.  Past Medical/Family/Surgical/Social History: Medications & Allergies reviewed per EMR, new medications updated. Patient Active Problem List   Diagnosis Date Noted  . Chronic cough 08/03/2018  . Other intervertebral disc degeneration, lumbar region 03/18/2018  . High-tone pelvic floor dysfunction  06/04/2017  . 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 Lumbar pain with radiation down left and right leg 07/02/2016  . Thyroid nodule 06/09/2015  . Nodule of right lung 07/28/2014  . Diverticulosis of colon without hemorrhage 07/28/2014  . DJD (degenerative joint disease), lumbar 02/22/2014  . Lumbar pain with radiation down left leg 01/17/2014  . Breast cancer of upper-inner quadrant of left female breast ([place]) 01/05/2014  . Hyperlipidemia associated with type 2 diabetes mellitus ([place]) 09/28/2013  . S/P total hysterectomy and bilateral salpingo-oophorectomy 09/20/2013  . Essential hypertension   . Diabetes mellitus ([place]) 09/16/2013   Past Medical History:  Diagnosis Date  . Breast cancer ([place]) 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  . Diabetes mellitus ([place]) 09/16/13   Diagnosed on 09/16/13; HgA1C was 7.2/metformin  . Dizziness   . GERD (gastroesophageal reflux disease)   . Headache   . Hyperlipidemia   . Hypertension 2014  . Low back pain   . Obesity, morbid, BMI 40.0-49.9 ([place])   . PONV (postoperative nausea and vomiting)   . S/P radiation therapy 04/05/14-05/20/14   left breast 60.4Gy totaldose   Family History  Problem Relation Age of Onset  . Diabetes Mother   . Multiple myeloma Mother 73  . Hearing loss Mother   . Diabetes Brother   . Cancer Brother 40       prostate cancer   . Diabetes Maternal Aunt   . Leukemia Maternal Aunt        dx in her 70s  . Alcoholism Brother   . Heart attack Father   . Diabetes Sister   . Diabetes Son    Past Surgical History:  Procedure Laterality Date  . ABDOMINAL HYSTERECTOMY N/A 09/20/2013   Procedure: HYSTERECTOMY ABDOMINAL;  Surgeon: Osborne Oman, MD;  Location: [facility];  Service: Gynecology;  Laterality: N/A;  . BREAST LUMPECTOMY Left 2015   radiation  . BREAST LUMPECTOMY WITH AXILLARY LYMPH NODE BIOPSY Left 12/29/13   left breast   . LAPAROSCOPIC ASSISTED VAGINAL HYSTERECTOMY  09/20/2013    Procedure: LAPAROSCOPIC ASSISTED VAGINAL HYSTERECTOMY;  Surgeon: Osborne Oman, MD;  Location: [facility];  Service: Gynecology;;  PT WAS EXAMINED WHILE UP IN [place] AND IT WAS DECIDED TO OPEN PT DUE TO LARGE MASS  . LUMBAR LAMINECTOMY/DECOMPRESSION MICRODISCECTOMY Right 12/14/2014   Procedure: Right Lumbar three-four microdiskectomy;  Surgeon: Newman Pies, MD;  Location: [facility];  Service: Neurosurgery;  Laterality: Right;  Right Lumbar three-four microdiskectomy  . RADIOACTIVE SEED GUIDED PARTIAL MASTECTOMY WITH AXILLARY SENTINEL LYMPH NODE BIOPSY Left 12/29/2013   Procedure: LEFT BREAST RADIOACTIVE SEED LOCALIZED LUMPECTOMY WITH SENTINEL LYMPH NODE MAPPING;  Surgeon: Erroll Luna, MD;  Location: [facility];  Service: General;  Laterality: Left;  . RE-EXCISION OF BREAST LUMPECTOMY Left 03/02/2014  Procedure: RE-EXCISION OF LEFT BREAST LUMPECTOMY;  Surgeon: Erroll Luna, MD;  Location: [facility];  Service: General;  Laterality: Left;  . SALPINGOOPHORECTOMY  09/20/2013   Procedure: SALPINGO OOPHORECTOMY;  Surgeon: Osborne Oman, MD;  Location: [facility];  Service: Gynecology;;   Social History   Occupational History    Employer: UNEMPLOYED  Tobacco Use  . Smoking status: Never Smoker  . Smokeless tobacco: Never Used  Vaping Use  . Vaping Use: Never used  Substance and Sexual Activity  . Alcohol use: No  . Drug use: No  . Sexual activity: Not Currently    Birth control/protection: Post-menopausal

## 2018-04-09 ENCOUNTER — Other Ambulatory Visit: Payer: Self-pay | Admitting: Critical Care Medicine

## 2018-04-09 DIAGNOSIS — M545 Low back pain, unspecified: Secondary | ICD-10-CM

## 2018-04-09 DIAGNOSIS — M79604 Pain in right leg: Secondary | ICD-10-CM

## 2018-04-23 ENCOUNTER — Other Ambulatory Visit: Payer: Self-pay

## 2018-04-23 ENCOUNTER — Encounter: Payer: Self-pay | Admitting: Internal Medicine

## 2018-04-23 ENCOUNTER — Ambulatory Visit: Payer: Medicare Other | Attending: Internal Medicine | Admitting: Internal Medicine

## 2018-04-23 VITALS — BP 116/80 | HR 71 | Temp 98.2°F | Resp 16 | Wt 222.0 lb

## 2018-04-23 DIAGNOSIS — E119 Type 2 diabetes mellitus without complications: Secondary | ICD-10-CM

## 2018-04-23 DIAGNOSIS — E785 Hyperlipidemia, unspecified: Secondary | ICD-10-CM

## 2018-04-23 DIAGNOSIS — I10 Essential (primary) hypertension: Secondary | ICD-10-CM

## 2018-04-23 DIAGNOSIS — K219 Gastro-esophageal reflux disease without esophagitis: Secondary | ICD-10-CM

## 2018-04-23 DIAGNOSIS — G8929 Other chronic pain: Secondary | ICD-10-CM

## 2018-04-23 DIAGNOSIS — R05 Cough: Secondary | ICD-10-CM

## 2018-04-23 DIAGNOSIS — E1169 Type 2 diabetes mellitus with other specified complication: Secondary | ICD-10-CM

## 2018-04-23 DIAGNOSIS — R053 Chronic cough: Secondary | ICD-10-CM

## 2018-04-23 DIAGNOSIS — M545 Low back pain: Secondary | ICD-10-CM

## 2018-04-23 LAB — GLUCOSE, POCT (MANUAL RESULT ENTRY): POC Glucose: 87 mg/dl (ref 70–99)

## 2018-04-23 MED ORDER — TRAMADOL HCL 50 MG PO TABS: 50.0000 mg | ORAL_TABLET | ORAL | 0 refills | Status: DC | PRN

## 2018-04-23 MED ORDER — OMEPRAZOLE 20 MG PO CPDR: 20.0000 mg | DELAYED_RELEASE_CAPSULE | Freq: Every day | ORAL | 3 refills | Status: DC

## 2018-04-23 NOTE — Progress Notes (Signed)
Patient ID: Rachel Herman, female    DOB: 04/06/1950  MRN: [id number]  CC: Diabetes and Hypertension   Subjective: Rachel Herman is a 68 y.o. female who presents for chronic ds management. Her concerns today include:  Pt with hx of HTN, DM, HL, DJD lumbar and spinal stenosiswith hx of laminectomy 2016, obesity, depression,DuctalCA LT s/plumpectomy/XRT (2015 and 2016, followed by Dr. Annamaria Boots), vaginal prolapse  Chronic LBP:  Seen in ER last mth flareup of chronic lower back pain.  She was subsequently seen in the office in follow-up by Dr. Joya Gaskins.  Referred to [facility] Dr. Arnoldo Morale.  Had MRI done that revealed mild lumbar spine spondylosis with some mild broad base disc bulge at L3-S1.  Dr. Cam Hai assessment was that surgical management not indicated.  She was sent to Dr. Ernestina Patches for an epidural steroid injection.  She reports that this has helped calm it down enough for her to function back to her baseline.  Dr. Arnoldo Morale had suggested having her see a pain specialist but patient does not wish to pursue that stating that she does not want to be on anything stronger than tramadol.  Percocet made her feel like she was going crazy so she discontinued taking it.  She was given prescription from the ER and also by Dr. Joya Gaskins.   Back taking Tramadol.  Request refill  DM:  BS started running in the 130s after ESI.Marland Kitchen  Now back down.  Does well with eating habits. Compliant with Metformin and diet Did not have eye exam done as yet but plans to schedule  C/o cough productive of white to green phlegm x 3 mths.  No SOB or fever.  Wheezing only when trying to cough up phlegm from throat.  No itchy throat, nasal congestion or rhinorrhea.  She does get heartburn even on Zantac.  HTN: Compliant with medications and salt restriction.  No chest pains or shortness of breath.  HL:  Cramps decreased without having to dec Pravachol.  Potassium level was normal.  HM: received  letter informing her that she is due for c-scope.  She plans to call and schedule soon.   Patient Active Problem List   Diagnosis Date Noted  . Other intervertebral disc degeneration, lumbar region 03/18/2018  . 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  . 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 Lumbar pain with radiation down left and right leg 07/02/2016  . Thyroid nodule 06/09/2015  . Nodule of right lung 07/28/2014  . Diverticulosis of colon without hemorrhage 07/28/2014  . DJD (degenerative joint disease), lumbar 02/22/2014  . Lumbar pain with radiation down left leg 01/17/2014  . Breast cancer of upper-inner quadrant of left female breast ([place]) 01/05/2014  . Hyperlipidemia associated with type 2 diabetes mellitus ([place]) 09/28/2013  . S/P total hysterectomy and bilateral salpingo-oophorectomy 09/20/2013  . Morbid obesity with body mass index of 40.0-44.9 in adult ([place]) 09/17/2013  . Essential hypertension   . Diabetes mellitus ([place]) 09/16/2013     Current Outpatient Medications on File Prior to Visit  Medication Sig Dispense Refill  . acetaminophen (TYLENOL) 500 MG tablet Take 1,000 mg by mouth every 6 (six) hours as needed for headache (pain).    Marland Kitchen anastrozole (ARIMIDEX) 1 MG tablet Take 1 tablet (1 mg total) by mouth daily. 90 tablet 3  . Ascorbic Acid (VITAMIN C) 1000 MG tablet Take 1,000 mg by mouth daily.    Marland Kitchen aspirin EC 325 MG tablet Take 325 mg  by mouth daily.    . Blood Glucose Monitoring Suppl (ONE TOUCH ULTRA MINI) w/Device KIT USE AS DIRECTED ONCE DAILY DX CODE E11.9 1 each 0  . cloNIDine (CATAPRES) 0.1 MG tablet Take 1 tablet (0.1 mg total) by mouth 2 (two) times daily. 60 tablet 4  . [facility] SENNA PLUS 8.6-50 MG tablet TAKE 2 TABLETS BY MOUTH AT BEDTIME AS NEEDED FOR MILD CONSTIPATION (Patient taking differently: Take 1 tablet by mouth at bedtime. ) 60 tablet 11  . gabapentin (NEURONTIN) 300 MG capsule Take 2 capsules (600 mg total) by mouth 2 (two)  times daily. 120 capsule 3  . glucose blood (ONE TOUCH ULTRA TEST) test strip USE AS DIRECTED ONCE DAILY DX CODE E11.9 100 each 12  . hydrochlorothiazide (HYDRODIURIL) 25 MG tablet Take 1 tablet (25 mg total) by mouth at bedtime. 30 tablet 4  . metFORMIN (GLUCOPHAGE) 500 MG tablet Take 1 tablet (500 mg total) by mouth 2 (two) times daily. 180 tablet 3  . methocarbamol (ROBAXIN) 500 MG tablet Take 1 tablet (500 mg total) by mouth 4 (four) times daily. 60 tablet 2  . Multiple Vitamins-Minerals (MULTIVITAMIN WITH MINERALS) tablet Take 1 tablet by mouth daily. Reported on 03/13/2015    . ONETOUCH DELICA LANCETS 95K MISC USE AS DIRECTED ONCE DAILY DX CODE E11.9 100 each 12  . potassium chloride (K-DUR) 10 MEQ tablet Take 1 tablet (10 mEq total) by mouth daily. 90 tablet 2  . pravastatin (PRAVACHOL) 20 MG tablet Take 1 tablet (20 mg total) by mouth daily. 90 tablet 3   No current facility-administered medications on file prior to visit.     No Known Allergies  Social History   Socioeconomic History  . Marital status: Married    Spouse name: Aislyn Hayse   . Number of children: 3  . Years of education: 9  . Highest education level: Not on file  Occupational History    Employer: UNEMPLOYED  Social Needs  . Financial resource strain: Not on file  . Food insecurity:    Worry: Not on file    Inability: Not on file  . Transportation needs:    Medical: Not on file    Non-medical: Not on file  Tobacco Use  . Smoking status: Never Smoker  . Smokeless tobacco: Never Used  Substance and Sexual Activity  . Alcohol use: No  . Drug use: No  . Sexual activity: Not Currently    Birth control/protection: Post-menopausal  Lifestyle  . Physical activity:    Days per week: Not on file    Minutes per session: Not on file  . Stress: Not on file  Relationships  . Social connections:    Talks on phone: Not on file    Gets together: Not on file    Attends religious service: Not on file     Active member of club or organization: Not on file    Attends meetings of clubs or organizations: Not on file    Relationship status: Not on file  . Intimate partner violence:    Fear of current or ex partner: Not on file    Emotionally abused: Not on file    Physically abused: Not on file    Forced sexual activity: Not on file  Other Topics Concern  . Not on file  Social History Narrative   Married to [employer] in 1986.    Has 3 children from previous marriage, 1 in [place], 1 in [place], 1 in prison ([place]).  Lives with husband.    Right-handed.   1 cup caffeine per day.    Family History  Problem Relation Age of Onset  . Diabetes Mother   . Multiple myeloma Mother 102  . Hearing loss Mother   . Diabetes Brother   . Cancer Brother 40       prostate cancer   . Diabetes Maternal Aunt   . Leukemia Maternal Aunt        dx in her 64s  . Alcoholism Brother   . Heart attack Father   . Diabetes Sister   . Diabetes Son     Past Surgical History:  Procedure Laterality Date  . ABDOMINAL HYSTERECTOMY N/A 09/20/2013   Procedure: HYSTERECTOMY ABDOMINAL;  Surgeon: Osborne Oman, MD;  Location: [facility];  Service: Gynecology;  Laterality: N/A;  . BREAST LUMPECTOMY Left 2015   radiation  . BREAST LUMPECTOMY WITH AXILLARY LYMPH NODE BIOPSY Left 12/29/13   left breast   . LAPAROSCOPIC ASSISTED VAGINAL HYSTERECTOMY  09/20/2013   Procedure: LAPAROSCOPIC ASSISTED VAGINAL HYSTERECTOMY;  Surgeon: Osborne Oman, MD;  Location: [facility];  Service: Gynecology;;  PT WAS EXAMINED WHILE UP IN [place] AND IT WAS DECIDED TO OPEN PT DUE TO LARGE MASS  . LUMBAR LAMINECTOMY/DECOMPRESSION MICRODISCECTOMY Right 12/14/2014   Procedure: Right Lumbar three-four microdiskectomy;  Surgeon: Newman Pies, MD;  Location: [facility];  Service: Neurosurgery;  Laterality: Right;  Right Lumbar three-four microdiskectomy  . RADIOACTIVE SEED GUIDED PARTIAL MASTECTOMY WITH AXILLARY SENTINEL LYMPH  NODE BIOPSY Left 12/29/2013   Procedure: LEFT BREAST RADIOACTIVE SEED LOCALIZED LUMPECTOMY WITH SENTINEL LYMPH NODE MAPPING;  Surgeon: Erroll Luna, MD;  Location: [facility];  Service: General;  Laterality: Left;  . RE-EXCISION OF BREAST LUMPECTOMY Left 03/02/2014   Procedure: RE-EXCISION OF LEFT BREAST LUMPECTOMY;  Surgeon: Erroll Luna, MD;  Location: [facility];  Service: General;  Laterality: Left;  . SALPINGOOPHORECTOMY  09/20/2013   Procedure: SALPINGO OOPHORECTOMY;  Surgeon: Osborne Oman, MD;  Location: [facility];  Service: Gynecology;;    ROS: Review of Systems Negative except as stated above  PHYSICAL EXAM: BP 116/80   Pulse 71   Temp 98.2 F (36.8 C) (Oral)   Resp 16   Wt 222 lb (100.7 kg)   SpO2 97%   BMI 34.77 kg/m   Physical Exam  General appearance - alert, well appearing, and in no distress Mental status - normal mood, behavior, speech, dress, motor activity, and thought processes Mouth - mucous membranes moist, pharynx normal without lesions Neck - supple, no significant adenopathy Chest - clear to auscultation, no wheezes, rales or rhonchi, symmetric air entry Heart - normal rate, regular rhythm, normal S1, S2, no murmurs, rubs, clicks or gallops Extremities - peripheral pulses normal, no pedal edema, no clubbing or cyanosis  CMP Latest Ref Rng & Units 03/18/2018 03/09/2018 01/22/2018  Glucose 65 - 99 mg/dL 124(H) 142(H) -  BUN 8 - 27 mg/dL 18 20 -  Creatinine 0.57 - 1.00 mg/dL 1.11(H) 1.12(H) -  Sodium 134 - 144 mmol/L 143 142 -  Potassium 3.5 - 5.2 mmol/L 4.0 3.6 4.2  Chloride 96 - 106 mmol/L 99 104 -  CO2 20 - 29 mmol/L 22 30 -  Calcium 8.7 - 10.3 mg/dL 10.0 9.4 -  Total Protein 6.5 - 8.1 g/dL - 7.0 -  Total Bilirubin 0.3 - 1.2 mg/dL - 0.4 -  Alkaline Phos 38 - 126 U/L - 57 -  AST 15 - 41  U/L - 28 -  ALT 0 - 44 U/L - 29 -   Lipid Panel     Component Value Date/Time   CHOL 190 01/22/2018 1201   TRIG 286 (H)  01/22/2018 1201   HDL 41 01/22/2018 1201   CHOLHDL 4.6 (H) 01/22/2018 1201   CHOLHDL 5.9 09/27/2013 1053   VLDL 48 (H) 09/27/2013 1053   LDLCALC 92 01/22/2018 1201    CBC    Component Value Date/Time   WBC 7.4 03/09/2018 0820   RBC 4.39 03/09/2018 0820   HGB 12.2 03/09/2018 0820   HGB 14.5 11/08/2016 0915   HCT 38.8 03/09/2018 0820   HCT 44.2 11/08/2016 0915   PLT 255 03/09/2018 0820   PLT 295 11/08/2016 0915   MCV 88.4 03/09/2018 0820   MCV 85.3 11/08/2016 0915   MCH 27.8 03/09/2018 0820   MCHC 31.4 03/09/2018 0820   RDW 13.4 03/09/2018 0820   RDW 15.1 (H) 11/08/2016 0915   LYMPHSABS 2.2 03/09/2018 0820   LYMPHSABS 1.7 11/08/2016 0915   MONOABS 0.6 03/09/2018 0820   MONOABS 0.7 11/08/2016 0915   EOSABS 0.2 03/09/2018 0820   EOSABS 0.1 11/08/2016 0915   BASOSABS 0.0 03/09/2018 0820   BASOSABS 0.0 11/08/2016 0915    ASSESSMENT AND PLAN: 1. Type 2 diabetes mellitus without complication, without long-term current use of insulin ([place]) Reported blood sugars at goal.  Encourage her to continue healthy eating habits.  Encouraged her to move more as her back allows.  Continue metformin - POCT glucose (manual entry) - Hemoglobin A1c  2. Essential hypertension At goal.  Continue clonidine and hydrochlorothiazide  3. Chronic bilateral low back pain without sciatica [place] controlled substance reporting system has been reviewed.  Patient did get short courses of Percocets but report that she was unable to tolerate.  She is due for refill on tramadol which she tolerates and gets good results allowing her to be functional. - traMADol (ULTRAM) 50 MG tablet; Take 1 tablet (50 mg total) by mouth every 4 (four) hours as needed.  Dispense: 120 tablet; Refill: 0 - traMADol (ULTRAM) 50 MG tablet; Take 1 tablet (50 mg total) by mouth every 4 (four) hours as needed (Fill on or after 05/24/2018).  Dispense: 120 tablet; Refill: 0  4. Hyperlipidemia associated with type 2 diabetes  mellitus (H[facility]) Continue Pravachol  5. Chronic cough 6. Gastroesophageal reflux disease without esophagitis Discontinue Zantac.  Start omeprazole instead.  GERD precautions discussed and encouraged - omeprazole (PRILOSEC) 20 MG capsule; Take 1 capsule (20 mg total) by mouth daily.  Dispense: 30 capsule; Refill: 3     Patient was given the opportunity to ask questions.  Patient verbalized understanding of the plan and was able to repeat key elements of the plan.   Orders Placed This Encounter  Procedures  . Hemoglobin A1c  . POCT glucose (manual entry)     Requested Prescriptions   Signed Prescriptions Disp Refills  . omeprazole (PRILOSEC) 20 MG capsule 30 capsule 3    Sig: Take 1 capsule (20 mg total) by mouth daily.  . traMADol (ULTRAM) 50 MG tablet 120 tablet 0    Sig: Take 1 tablet (50 mg total) by mouth every 4 (four) hours as needed.  . traMADol (ULTRAM) 50 MG tablet 120 tablet 0    Sig: Take 1 tablet (50 mg total) by mouth every 4 (four) hours as needed (Fill on or after 05/24/2018).    Return in about 3 months (around 07/24/2018).  Neoma Laming  Wynetta Emery, MD, [facility]ss[facility]

## 2018-04-24 ENCOUNTER — Telehealth: Payer: Self-pay

## 2018-04-24 LAB — HEMOGLOBIN A1C
Est. average glucose Bld gHb Est-mCnc: 140 mg/dL
Hgb A1c MFr Bld: 6.5 % — ABNORMAL HIGH (ref 4.8–5.6)

## 2018-04-24 NOTE — Telephone Encounter (Signed)
Contacted pt to go over lab results pt didn't answer and was unable to lvm due to phone just kept ringing

## 2018-05-01 ENCOUNTER — Other Ambulatory Visit: Payer: Self-pay | Admitting: Internal Medicine

## 2018-05-01 DIAGNOSIS — R1084 Generalized abdominal pain: Secondary | ICD-10-CM

## 2018-07-16 ENCOUNTER — Other Ambulatory Visit: Payer: Self-pay | Admitting: Internal Medicine

## 2018-07-16 DIAGNOSIS — K219 Gastro-esophageal reflux disease without esophagitis: Secondary | ICD-10-CM

## 2018-07-27 ENCOUNTER — Encounter: Payer: Self-pay | Admitting: Internal Medicine

## 2018-07-27 ENCOUNTER — Other Ambulatory Visit: Payer: Self-pay | Admitting: Internal Medicine

## 2018-07-27 ENCOUNTER — Other Ambulatory Visit: Payer: Self-pay

## 2018-07-27 ENCOUNTER — Ambulatory Visit: Payer: Medicare Other | Attending: Internal Medicine | Admitting: Internal Medicine

## 2018-07-27 DIAGNOSIS — G8929 Other chronic pain: Secondary | ICD-10-CM

## 2018-07-27 DIAGNOSIS — E119 Type 2 diabetes mellitus without complications: Secondary | ICD-10-CM

## 2018-07-27 DIAGNOSIS — I10 Essential (primary) hypertension: Secondary | ICD-10-CM

## 2018-07-27 DIAGNOSIS — K429 Umbilical hernia without obstruction or gangrene: Secondary | ICD-10-CM

## 2018-07-27 DIAGNOSIS — M5416 Radiculopathy, lumbar region: Secondary | ICD-10-CM

## 2018-07-27 DIAGNOSIS — R053 Chronic cough: Secondary | ICD-10-CM

## 2018-07-27 DIAGNOSIS — R05 Cough: Secondary | ICD-10-CM

## 2018-07-27 MED ORDER — HYDROCHLOROTHIAZIDE 25 MG PO TABS: 25.0000 mg | ORAL_TABLET | Freq: Every day | ORAL | 6 refills | Status: DC

## 2018-07-27 MED ORDER — TRAMADOL HCL 50 MG PO TABS: 50.0000 mg | ORAL_TABLET | ORAL | 0 refills | Status: DC | PRN

## 2018-07-27 MED ORDER — GABAPENTIN 300 MG PO CAPS: 600.0000 mg | ORAL_CAPSULE | Freq: Two times a day (BID) | ORAL | 3 refills | Status: DC

## 2018-07-27 MED ORDER — ALBUTEROL SULFATE HFA 108 (90 BASE) MCG/ACT IN AERS: 2.0000 | INHALATION_SPRAY | Freq: Four times a day (QID) | RESPIRATORY_TRACT | 3 refills | Status: DC | PRN

## 2018-07-27 NOTE — Progress Notes (Signed)
Virtual Visit via Telephone Note Due to current restrictions/limitations of in-office visits due to the COVID-19 pandemic, this scheduled clinical appointment was converted to a telehealth visit  I connected with Rachel Herman on 07/27/18 at 9:26 a.m EDT by telephone and verified that I am speaking with the correct person using two identifiers. I am in my office.  The patient is at home.  Only the patient and myself participated in this encounter.  I discussed the limitations, risks, security and privacy concerns of performing an evaluation and management service by telephone and the availability of in person appointments. I also discussed with the patient that there may be a patient responsible charge related to this service. The patient expressed understanding and agreed to proceed.  History of Present Illness: Pt with hx of HTN, DM, HL, DJD lumbar and spinal stenosiswith hx of laminectomy 2016, obesity, depression,DuctalCA LT s/plumpectomy/XRT (2015 and 2016, followed by Dr. Annamaria Boots), vaginal prolapse.  Patient last seen in March of this year.  Purpose of today's visit is for chronic disease management.  Chronic LBP:  Request RF on Tramadol.  "I've been trying to stretch it as long as I could so I've been out about 3-4 days."  Tries to take Q5-6 hrs instead of Q 4 hrs if pain not too bad. No significant S.E from med.  Works in her yard/garden often and walks every morning for 20 mins and in the evenings for 30 mins with her dog.  Has umbilical hernia.  Noticed that her umbilicus is protruding more.  Some soreness in this area when she bends over to work in her garden.  No pain with meals.  +nausea at times  Cough: decreased a little with Omeprazole.  Cough productive of clear phlegm.  Occurs more during the day.  No drainage at back of throat, nasal or sinus congestion.  No fever.  No allergy symptoms.  Wheezing sometimes when she is out walking.  Reports being on Albuterol inhaler 1-2 yrs  ago and found it helpful  DM:  BS have been good in past mth.  BS this a.m was 104; last evening was 98.  Prior to this BS had jumped to 140-150.  -feels she does okay with eating habits.  wgh now is 225 lbs; on last visit it was 222 lbs -over due for eye exam.  Would like to schedule  HTN:  No device to check BP Compliant with meds and salt restriction No CP/LE edema/HA/dizziness  HL: compliant with pravachol  HM:  She plans to schedule c-scope  Outpatient Encounter Medications as of 07/27/2018  Medication Sig  . acetaminophen (TYLENOL) 500 MG tablet Take 1,000 mg by mouth every 6 (six) hours as needed for headache (pain).  Marland Kitchen anastrozole (ARIMIDEX) 1 MG tablet Take 1 tablet (1 mg total) by mouth daily.  . Ascorbic Acid (VITAMIN C) 1000 MG tablet Take 1,000 mg by mouth daily.  Marland Kitchen aspirin EC 325 MG tablet Take 325 mg by mouth daily.  . Blood Glucose Monitoring Suppl (ONE TOUCH ULTRA MINI) w/Device KIT USE AS DIRECTED ONCE DAILY DX CODE E11.9  . cloNIDine (CATAPRES) 0.1 MG tablet Take 1 tablet (0.1 mg total) by mouth 2 (two) times daily.  . [facility]S SENNA PLUS 8.6-50 MG tablet TAKE 2 TABLETS BY MOUTH AT BEDTIME AS NEEDED FOR MILD CONSTIPATION (Patient taking differently: Take 1 tablet by mouth at bedtime. )  . gabapentin (NEURONTIN) 300 MG capsule Take 2 capsules (600 mg total) by mouth 2 (two) times  daily.  . glucose blood (ONE TOUCH ULTRA TEST) test strip USE AS DIRECTED ONCE DAILY DX CODE E11.9  . hydrochlorothiazide (HYDRODIURIL) 25 MG tablet Take 1 tablet (25 mg total) by mouth at bedtime.  . metFORMIN (GLUCOPHAGE) 500 MG tablet Take 1 tablet (500 mg total) by mouth 2 (two) times daily.  . methocarbamol (ROBAXIN) 500 MG tablet Take 1 tablet (500 mg total) by mouth 4 (four) times daily.  . Multiple Vitamins-Minerals (MULTIVITAMIN WITH MINERALS) tablet Take 1 tablet by mouth daily. Reported on 03/13/2015  . omeprazole (PRILOSEC) 20 MG capsule TAKE 1 CAPSULE BY MOUTH EVERY DAY  . ONETOUCH DELICA  LANCETS 61W MISC USE AS DIRECTED ONCE DAILY DX CODE E11.9  . potassium chloride (K-DUR) 10 MEQ tablet Take 1 tablet (10 mEq total) by mouth daily.  . pravastatin (PRAVACHOL) 20 MG tablet Take 1 tablet (20 mg total) by mouth daily.  . traMADol (ULTRAM) 50 MG tablet Take 1 tablet (50 mg total) by mouth every 4 (four) hours as needed.  . traMADol (ULTRAM) 50 MG tablet Take 1 tablet (50 mg total) by mouth every 4 (four) hours as needed (Fill on or after 05/24/2018).   No facility-administered encounter medications on file as of 07/27/2018.     Observations/Objective:  Lab Results  Component Value Date   WBC 7.4 03/09/2018   HGB 12.2 03/09/2018   HCT 38.8 03/09/2018   MCV 88.4 03/09/2018   PLT 255 03/09/2018   Lab Results  Component Value Date   CHOL 190 01/22/2018   HDL 41 01/22/2018   LDLCALC 92 01/22/2018   TRIG 286 (H) 01/22/2018   CHOLHDL 4.6 (H) 01/22/2018     Chemistry      Component Value Date/Time   NA 143 03/18/2018 1105   NA 143 11/08/2016 0915   K 4.0 03/18/2018 1105   K 3.0 (LL) 11/08/2016 0915   CL 99 03/18/2018 1105   CO2 22 03/18/2018 1105   CO2 30 (H) 11/08/2016 0915   BUN 18 03/18/2018 1105   BUN 18.9 11/08/2016 0915   CREATININE 1.11 (H) 03/18/2018 1105   CREATININE 1.2 (H) 11/08/2016 0915      Component Value Date/Time   CALCIUM 10.0 03/18/2018 1105   CALCIUM 10.2 11/08/2016 0915   ALKPHOS 57 03/09/2018 0820   ALKPHOS 68 11/08/2016 0915   AST 28 03/09/2018 0820   AST 24 11/08/2016 0915   ALT 29 03/09/2018 0820   ALT 23 11/08/2016 0915   BILITOT 0.4 03/09/2018 0820   BILITOT 0.57 11/08/2016 0915      Assessment and Plan: 1. Type 2 diabetes mellitus without complication, without long-term current use of insulin (Rachel Herman) -encouraged her to continue healthy eating habits and regular exercise Continue Metformin - Ambulatory referral to Ophthalmology  2. Essential hypertension Level of control unknown.  Continue HCTZ and Clonidine and low salt  diet - hydrochlorothiazide (HYDRODIURIL) 25 MG tablet; Take 1 tablet (25 mg total) by mouth at bedtime.  Dispense: 30 tablet; Refill: 6  3. Chronic radicular pain of lower back -Doing okay on Tramadol. Pt functional.  No significant S.E from Tramadol - gabapentin (NEURONTIN) 300 MG capsule; Take 2 capsules (600 mg total) by mouth 2 (two) times daily.  Dispense: 120 capsule; Refill: 3 - traMADol (ULTRAM) 50 MG tablet; Take 1 tablet (50 mg total) by mouth every 4 (four) hours as needed.  Dispense: 120 tablet; Refill: 0  4. Umbilical hernia without obstruction or gangrene -Discussed signs and symptoms that would suggest strangulated  hernia need for immediate medical attention.  Advised to avoid heavy lifting.  We discussed getting CT scan done and referral to general surgeon.  However patient states that she would not want to go the surgical route at this time because it is not too bothersome for her.  She will come in in about 2 weeks for an in person visit for me to take a look at it.  5. Chronic cough Will have her see Dr. Joya Gaskins for further evaluation - albuterol (VENTOLIN HFA) 108 (90 Base) MCG/ACT inhaler; Inhale 2 puffs into the lungs every 6 (six) hours as needed for wheezing or shortness of breath.  Dispense: 8 g; Refill: 3   Follow Up Instructions: Follow-up in 1 to 2 weeks for me to take a look at the hernia   I discussed the assessment and treatment plan with the patient. The patient was provided an opportunity to ask questions and all were answered. The patient agreed with the plan and demonstrated an understanding of the instructions.   The patient was advised to call back or seek an in-person evaluation if the symptoms worsen or if the condition fails to improve as anticipated.  I provided 20 minutes of non-face-to-face time during this encounter.   Karle Plumber, MD

## 2018-07-27 NOTE — Telephone Encounter (Signed)
Patient said you where supposed to send her pain medication in please let me know what to tell her

## 2018-07-27 NOTE — Progress Notes (Signed)
Pt states her blood sugar this morning was 104 and last night it was 98

## 2018-07-28 NOTE — Telephone Encounter (Signed)
Yes maam. I completed this directly after lunch. [place] had a note on my keyboard to call.

## 2018-08-02 NOTE — Progress Notes (Addendum)
Subjective:    Patient ID: Rachel Herman, female    DOB: 1950-07-04, 68 y.o.   MRN: [id number]  67 y.o.F with history of diabetes type 2, hypertension, hyperlipidemia, chronic low back pain, diverticulosis.  The patient notes the onset for the past 2 months of a chronic cough productive of thin clear mucus.  The cough did improve a bit with omeprazole.  The patient also has been given an albuterol inhaler with some improvement.  She denies any previous history of asthma.  She has no significant history of hypersensitivity.  She denies any significant heartburn currently.  She does states she has a tendency to want to clear her throat constantly.  The patient denies any postnasal drainage or nasal congestion.  There is no ear pain.  The cough is worse with exertion.  The patient has not been on any oral cough suppressant.  Review cough assessment below  Cough This is a new problem. The current episode started more than 1 month ago. The problem has been gradually worsening. The problem occurs every few hours (if exerts self will cough,  occ qhs cough). The cough is productive of sputum. Associated symptoms include headaches, shortness of breath and wheezing. Pertinent negatives include no chest pain, ear congestion, ear pain, heartburn, hemoptysis, nasal congestion, postnasal drip, rash or rhinorrhea. Associated symptoms comments: Notes chest pressure.. The symptoms are aggravated by exercise. She has tried a beta-agonist inhaler for the symptoms. The treatment provided moderate relief. Her past medical history is significant for pneumonia. There is no history of asthma, bronchitis, COPD, emphysema or environmental allergies.     Past Medical History:  Diagnosis Date  . Breast cancer ([place]) 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  . Diabetes mellitus ([place]) 09/16/13   Diagnosed on 09/16/13; HgA1C was 7.2/metformin  . Dizziness   . GERD (gastroesophageal reflux disease)   . Headache    . Hyperlipidemia   . Hypertension 2014  . Low back pain   . Obesity, morbid, BMI 40.0-49.9 ([place])   . PONV (postoperative nausea and vomiting)   . S/P radiation therapy 04/05/14-05/20/14   left breast 60.4Gy totaldose     Family History  Problem Relation Age of Onset  . Diabetes Mother   . Multiple myeloma Mother 69  . Hearing loss Mother   . Diabetes Brother   . Cancer Brother 40       prostate cancer   . Diabetes Maternal Aunt   . Leukemia Maternal Aunt        dx in her 80s  . Alcoholism Brother   . Heart attack Father   . Diabetes Sister   . Diabetes Son      Social History   Socioeconomic History  . Marital status: Married    Spouse name: Crista Nuon   . Number of children: 3  . Years of education: 57  . Highest education level: Not on file  Occupational History    Employer: UNEMPLOYED  Social Needs  . Financial resource strain: Not on file  . Food insecurity    Worry: Not on file    Inability: Not on file  . Transportation needs    Medical: Not on file    Non-medical: Not on file  Tobacco Use  . Smoking status: Never Smoker  . Smokeless tobacco: Never Used  Substance and Sexual Activity  . Alcohol use: No  . Drug use: No  . Sexual activity: Not Currently    Birth control/protection: Post-menopausal  Lifestyle  . Physical activity    Days per week: Not on file    Minutes per session: Not on file  . Stress: Not on file  Relationships  . Social Herbalist on phone: Not on file    Gets together: Not on file    Attends religious service: Not on file    Active member of club or organization: Not on file    Attends meetings of clubs or organizations: Not on file    Relationship status: Not on file  . Intimate partner violence    Fear of current or ex partner: Not on file    Emotionally abused: Not on file    Physically abused: Not on file    Forced sexual activity: Not on file  Other Topics Concern  . Not on file  Social History  Narrative   Married to [employer] in 1986.    Has 3 children from previous marriage, 1 in [place], 1 in [place], 1 in prison ([place]).    Lives with husband.    Right-handed.   1 cup caffeine per day.     No Known Allergies   Outpatient Medications Prior to Visit  Medication Sig Dispense Refill  . albuterol (VENTOLIN HFA) 108 (90 Base) MCG/ACT inhaler Inhale 2 puffs into the lungs every 6 (six) hours as needed for wheezing or shortness of breath. 8 g 3  . anastrozole (ARIMIDEX) 1 MG tablet Take 1 tablet (1 mg total) by mouth daily. 90 tablet 3  . Ascorbic Acid (VITAMIN C) 1000 MG tablet Take 1,000 mg by mouth daily.    Marland Kitchen aspirin EC 325 MG tablet Take 325 mg by mouth daily.    . Blood Glucose Monitoring Suppl (ONE TOUCH ULTRA MINI) w/Device KIT USE AS DIRECTED ONCE DAILY DX CODE E11.9 1 each 0  . cloNIDine (CATAPRES) 0.1 MG tablet Take 1 tablet (0.1 mg total) by mouth 2 (two) times daily. 60 tablet 4  . [facility] SENNA PLUS 8.6-50 MG tablet TAKE 2 TABLETS BY MOUTH AT BEDTIME AS NEEDED FOR MILD CONSTIPATION (Patient taking differently: Take 1 tablet by mouth at bedtime. ) 60 tablet 11  . gabapentin (NEURONTIN) 300 MG capsule Take 2 capsules (600 mg total) by mouth 2 (two) times daily. 120 capsule 3  . glucose blood (ONE TOUCH ULTRA TEST) test strip USE AS DIRECTED ONCE DAILY DX CODE E11.9 100 each 12  . hydrochlorothiazide (HYDRODIURIL) 25 MG tablet Take 1 tablet (25 mg total) by mouth at bedtime. 30 tablet 6  . metFORMIN (GLUCOPHAGE) 500 MG tablet Take 1 tablet (500 mg total) by mouth 2 (two) times daily. 180 tablet 3  . methocarbamol (ROBAXIN) 500 MG tablet Take 1 tablet (500 mg total) by mouth 4 (four) times daily. 60 tablet 2  . Multiple Vitamins-Minerals (MULTIVITAMIN WITH MINERALS) tablet Take 1 tablet by mouth daily. Reported on 03/13/2015    . omeprazole (PRILOSEC) 20 MG capsule TAKE 1 CAPSULE BY MOUTH EVERY DAY 90 capsule 0  . ONETOUCH DELICA LANCETS 33I MISC USE AS DIRECTED ONCE  DAILY DX CODE E11.9 100 each 12  . potassium chloride (K-DUR) 10 MEQ tablet Take 1 tablet (10 mEq total) by mouth daily. 90 tablet 2  . pravastatin (PRAVACHOL) 20 MG tablet Take 1 tablet (20 mg total) by mouth daily. 90 tablet 3  . traMADol (ULTRAM) 50 MG tablet Take 1 tablet (50 mg total) by mouth every 4 (four) hours as needed. 120 tablet 0  .  acetaminophen (TYLENOL) 500 MG tablet Take 1,000 mg by mouth every 6 (six) hours as needed for headache (pain).    Marland Kitchen albuterol (VENTOLIN HFA) 108 (90 Base) MCG/ACT inhaler     . gabapentin (NEURONTIN) 300 MG capsule     . glucose blood test strip TEST ONCE DAILY AS DIRECTED E11.9    . traMADol (ULTRAM) 50 MG tablet Take 1 tablet (50 mg total) by mouth every 4 (four) hours as needed. 120 tablet 0   No facility-administered medications prior to visit.     Review of Systems  HENT: Negative for ear pain, postnasal drip and rhinorrhea.   Respiratory: Positive for cough, shortness of breath and wheezing. Negative for hemoptysis.   Cardiovascular: Negative for chest pain.  Gastrointestinal: Negative for heartburn.  Skin: Negative for rash.  Allergic/Immunologic: Negative for environmental allergies.  Neurological: Positive for headaches.       Objective:   Physical Exam Vitals:   08/03/18 0852  BP: 135/79  Pulse: 73  Resp: 16  Temp: 98.3 F (36.8 C)  TempSrc: Oral  SpO2: 95%  Weight: 226 lb (102.5 kg)    Gen: Pleasant, well-nourished, in no distress,  normal affect  ENT: No lesions,  mouth clear,  oropharynx clear, no postnasal drip The ears are clear Neck: No JVD, no TMG, no carotid bruits  Lungs: No use of accessory muscles, no dullness to percussion, clear without rales or rhonchi, there is no wheezing and good air flow was noted  Cardiovascular: RRR, heart sounds normal, no murmur or gallops, no peripheral edema  Abdomen: soft slightly tender in the epigastric area,  no HSM,  BS normal  Musculoskeletal: No deformities, no  cyanosis or clubbing  Neuro: alert, non focal  Skin: Warm, no lesions or rashes  No recent chest x-ray    Assessment & Plan:  I personally reviewed all images and lab data in the [facility] system as well as any outside material available during this office visit and agree with the  radiology impressions.   Chronic cough Cyclic cough with upper airway irritability.  There is no evidence of any medication induced cause of the patient's cough.  The patient does not have active bronchospasm at this time.  I suspect this is secondary to reflux with upper airway irritation and there may be some component of microaspiration resulting in episodic bronchospasm which is not apparent on today's exam.  Plan will be to continue albuterol on an as-needed basis and obtain a chest x-ray for further review  I instructed the patient to stay on her omeprazole and follow a reflux diet  The patient also was instructed to use sugar-free candy drops to train yourself to swallow instead of clearing her throat and coughing  Over-the-counter cough suppressants can be considered as well.  I was not compelled to begin this patient on any systemic steroids secondary to her diabetes  If the chest x-ray is unremarkable then return can be as needed   Zillah was seen today for cough.  Diagnoses and all orders for this visit:  Chronic cough -     DG Chest 2 View; Future  Type 2 diabetes mellitus without complication, without long-term current use of insulin (HCC) -     Glucose (CBG)   Note the chest x-ray result return while I was dictating this note and it showed no active disease and was normal

## 2018-08-03 ENCOUNTER — Encounter: Payer: Self-pay | Admitting: Critical Care Medicine

## 2018-08-03 ENCOUNTER — Other Ambulatory Visit: Payer: Self-pay

## 2018-08-03 ENCOUNTER — Ambulatory Visit: Payer: Medicare Other | Attending: Critical Care Medicine | Admitting: Critical Care Medicine

## 2018-08-03 ENCOUNTER — Ambulatory Visit
Admission: RE | Admit: 2018-08-03 | Discharge: 2018-08-03 | Disposition: A | Discharge status: Home / Self Care | Payer: Medicare Other | Source: Ambulatory Visit | Attending: Critical Care Medicine | Admitting: Critical Care Medicine

## 2018-08-03 VITALS — BP 135/79 | HR 73 | Temp 98.3°F | Resp 16 | Wt 226.0 lb

## 2018-08-03 DIAGNOSIS — R05 Cough: Secondary | ICD-10-CM | POA: Diagnosis not present

## 2018-08-03 DIAGNOSIS — E119 Type 2 diabetes mellitus without complications: Secondary | ICD-10-CM

## 2018-08-03 DIAGNOSIS — R053 Chronic cough: Secondary | ICD-10-CM

## 2018-08-03 LAB — GLUCOSE, POCT (MANUAL RESULT ENTRY): POC Glucose: 101 mg/dl — AB (ref 70–99)

## 2018-08-03 NOTE — Patient Instructions (Signed)
Continue albuterol as needed for the cough  Continue the omeprazole daily for your stomach acid  Follow a reflux diet as noted below  We will obtain a chest x-ray we will call you with results  Use sugar-free candy drops to place in your mouth and train your self to swallow instead of coughing and clearing your throat

## 2018-08-03 NOTE — Assessment & Plan Note (Signed)
Cyclic cough with upper airway irritability.  There is no evidence of any medication induced cause of the patient's cough.  The patient does not have active bronchospasm at this time.  I suspect this is secondary to reflux with upper airway irritation and there may be some component of microaspiration resulting in episodic bronchospasm which is not apparent on today's exam.  Plan will be to continue albuterol on an as-needed basis and obtain a chest x-ray for further review  I instructed the patient to stay on her omeprazole and follow a reflux diet  The patient also was instructed to use sugar-free candy drops to train yourself to swallow instead of clearing her throat and coughing  Over-the-counter cough suppressants can be considered as well.  I was not compelled to begin this patient on any systemic steroids secondary to her diabetes  If the chest x-ray is unremarkable then return can be as needed

## 2018-08-03 NOTE — Progress Notes (Signed)
Cough- congestion Sx's x 2 months

## 2018-08-14 ENCOUNTER — Other Ambulatory Visit: Payer: Self-pay | Admitting: Critical Care Medicine

## 2018-08-14 DIAGNOSIS — I152 Hypertension secondary to endocrine disorders: Secondary | ICD-10-CM

## 2018-08-26 ENCOUNTER — Telehealth: Payer: Self-pay | Admitting: Internal Medicine

## 2018-08-26 DIAGNOSIS — G8929 Other chronic pain: Secondary | ICD-10-CM

## 2018-08-26 DIAGNOSIS — M545 Low back pain, unspecified: Secondary | ICD-10-CM

## 2018-08-26 NOTE — Telephone Encounter (Signed)
Pt needs refill -traMADol (ULTRAM) 50 MG tablet To -[id number] [place], [place] - [address]

## 2018-08-27 MED ORDER — TRAMADOL HCL 50 MG PO TABS: 50.0000 mg | ORAL_TABLET | ORAL | 0 refills | Status: DC | PRN

## 2018-08-27 NOTE — Telephone Encounter (Signed)
Refilled

## 2018-08-27 NOTE — Telephone Encounter (Signed)
Contacted pt and made aware that rx has been sent  

## 2018-08-31 DIAGNOSIS — Z800 Family history of malignant neoplasm of digestive organs: Secondary | ICD-10-CM | POA: Diagnosis not present

## 2018-08-31 DIAGNOSIS — Z86010 Personal history of colonic polyps: Secondary | ICD-10-CM | POA: Diagnosis not present

## 2018-08-31 DIAGNOSIS — K219 Gastro-esophageal reflux disease without esophagitis: Secondary | ICD-10-CM | POA: Diagnosis not present

## 2018-08-31 DIAGNOSIS — E119 Type 2 diabetes mellitus without complications: Secondary | ICD-10-CM | POA: Diagnosis not present

## 2018-08-31 NOTE — Telephone Encounter (Signed)
1) Medication(s) Requested (by name): traMADol (ULTRAM) 50 MG tablet [[id number]]     2) Pharmacy of Choice: [id number] [place], [place] - [address]  3) Special Requests: Rx had already been sent, pt showed up to pick it up, but was told there was no Rx    Approved medications will be sent to the pharmacy, we will reach out if there is an issue.  Requests made after 3pm may not be addressed until the following business day!  If a patient is unsure of the name of the medication(s) please note and ask patient to call back when they are able to provide all info, do not send to responsible party until all information is available!

## 2018-08-31 NOTE — Telephone Encounter (Signed)
Per last message from [facility] patient has been notified that the medication was APPROVED and sent to her pharmacy.

## 2018-09-01 ENCOUNTER — Other Ambulatory Visit: Payer: Self-pay | Admitting: Internal Medicine

## 2018-09-01 ENCOUNTER — Other Ambulatory Visit: Payer: Self-pay | Admitting: Critical Care Medicine

## 2018-09-01 ENCOUNTER — Telehealth: Payer: Self-pay | Admitting: Family Medicine

## 2018-09-01 DIAGNOSIS — K219 Gastro-esophageal reflux disease without esophagitis: Secondary | ICD-10-CM

## 2018-09-01 DIAGNOSIS — I152 Hypertension secondary to endocrine disorders: Secondary | ICD-10-CM

## 2018-09-01 DIAGNOSIS — G8929 Other chronic pain: Secondary | ICD-10-CM

## 2018-09-01 MED ORDER — TRAMADOL HCL 50 MG PO TABS: 50.0000 mg | ORAL_TABLET | ORAL | 0 refills | Status: DC | PRN

## 2018-09-01 NOTE — Telephone Encounter (Signed)
Do you mind addressing this? Please see Margaret's message to Jay'a below. My rx from 08/27/18 says printed so I don't think she got it filled as I didn't sign it since I was of the opinion it went electronically. Thanks.

## 2018-09-01 NOTE — Addendum Note (Signed)
Addended by: Karle Plumber B on: 09/01/2018 05:41 PM   Modules accepted: Orders

## 2018-09-01 NOTE — Telephone Encounter (Signed)
Pt called to request her script be sent [facility] because [facility],the medication is marked as a printed order however it is not in the pickup binder either. please follow up with  -[id number] [place], [place] - [address]

## 2018-09-02 ENCOUNTER — Other Ambulatory Visit: Payer: Self-pay

## 2018-09-02 DIAGNOSIS — M545 Low back pain, unspecified: Secondary | ICD-10-CM

## 2018-09-02 DIAGNOSIS — G8929 Other chronic pain: Secondary | ICD-10-CM

## 2018-09-02 NOTE — Telephone Encounter (Signed)
Patient verified DOB Patients tramadol was called in for 120 dispense for 1 Q4H for pain with no refills.

## 2018-09-03 NOTE — Progress Notes (Signed)
[facility]   Telephone:([phone] Fax:([phone]) [phone]   Clinic Follow up Note   Patient Care Team: Ladell Pier, MD as PCP - General (Internal Medicine) Boykin Nearing, MD as Consulting Physician (Family Medicine) Erroll Luna, MD as Consulting Physician (General Surgery) Truitt Merle, MD as Consulting Physician (Hematology) Kyung Rudd, MD as Consulting Physician (Radiation Oncology)   I connected with [name] on 09/07/2018 at  8:30 AM EDT by telephone visit and verified that I am speaking with the correct person using two identifiers.  I discussed the limitations, risks, security and privacy concerns of performing an evaluation and management service by telephone and the availability of in person appointments. I also discussed with the patient that there may be a patient responsible charge related to this service. The patient expressed understanding and agreed to proceed.   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 Ultrasound of the left axilla is negative.      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    12/29/2013 Surgery   Left lumpectomy with close (<0.1cm) inferior margin   03/02/2014 Surgery   Reexcision for close margin, path negative for malignancy.   04/05/2014 - 05/20/2014 Radiation Therapy   adjuvant breast  radiation    05/27/2014 Imaging   Bone density scan: normal    06/07/2014 -  Anti-estrogen oral therapy   Exemestane 25 mg daily, she stopped in Nov 2018 due to high copay. Change anastrozole on 05/09/2017    06/20/2015 Imaging   Bone scan 06/20/2015 IMPRESSION: No evidence of metastatic disease. Mild increased activity dorsal aspect right midfoot probable degenerative in nature. Clinical correlation is necessary.   02/29/2016 Mammogram   MM DIAG BREAST TOMO BIALTERAL 02/29/16 IMPRESSION: Stable left breast lumpectomy site. No mammographic evidence of malignancy in the bilateral breasts.   03/03/2017 Mammogram   IMPRESSION: 1. No mammographic evidence of malignancy in either breast. 2. Stable left breast posttreatment changes.      CURRENT THERAPY:  Exemestane 106m daily, started on 06/07/2014. She stopped in Nov 2018 due to high copay. Changed toanastrozole on 05/09/2017. Plan to complete at end of 2021.   INTERVAL HISTORY:  DJESYKA SLAGHTis here for a follow up of left breast cancer. She was last seen by me 6 months ago.  She notes she is doing well. She denies any new changes. She is still on anastrozole and tolerating well. She does have sweating but manageable. She does have joint pain. She takes gabapentin and tylenol. She is still able to function well and ambulate with no assistance.  She notes her HTN and DM is doing well and she checks it at home. She overall does not have any concerns.     REVIEW OF SYSTEMS:   Constitutional: Denies fevers, chills or abnormal weight loss (+) sweating, manageable Eyes: Denies blurriness  of vision Ears, nose, mouth, throat, and face: Denies mucositis or sore throat Respiratory: Denies cough, dyspnea or wheezes Cardiovascular: Denies palpitation, chest discomfort or lower extremity swelling Gastrointestinal:  Denies nausea, heartburn or change in bowel habits Skin: Denies abnormal skin rashes MSK: (+) joint pain.  Lymphatics: Denies new  lymphadenopathy or easy bruising Neurological:Denies numbness, tingling or new weaknesses Behavioral/Psych: Mood is stable, no new changes  All other systems were reviewed with the patient and are negative.  MEDICAL HISTORY:  Past Medical History:  Diagnosis Date  . Breast cancer ([place]) 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  . Diabetes mellitus ([place]) 09/16/13   Diagnosed on 09/16/13; HgA1C was 7.2/metformin  . Dizziness   . GERD (gastroesophageal reflux disease)   . Headache   . Hyperlipidemia   . Hypertension 2014  . Low back pain   . Obesity, morbid, BMI 40.0-49.9 ([place])   . PONV (postoperative nausea and vomiting)   . S/P radiation therapy 04/05/14-05/20/14   left breast 60.4Gy totaldose    SURGICAL HISTORY: Past Surgical History:  Procedure Laterality Date  . ABDOMINAL HYSTERECTOMY N/A 09/20/2013   Procedure: HYSTERECTOMY ABDOMINAL;  Surgeon: Osborne Oman, MD;  Location: [facility];  Service: Gynecology;  Laterality: N/A;  . BREAST LUMPECTOMY Left 2015   radiation  . BREAST LUMPECTOMY WITH AXILLARY LYMPH NODE BIOPSY Left 12/29/13   left breast   . LAPAROSCOPIC ASSISTED VAGINAL HYSTERECTOMY  09/20/2013   Procedure: LAPAROSCOPIC ASSISTED VAGINAL HYSTERECTOMY;  Surgeon: Osborne Oman, MD;  Location: [facility];  Service: Gynecology;;  PT WAS EXAMINED WHILE UP IN [place] AND IT WAS DECIDED TO OPEN PT DUE TO LARGE MASS  . LUMBAR LAMINECTOMY/DECOMPRESSION MICRODISCECTOMY Right 12/14/2014   Procedure: Right Lumbar three-four microdiskectomy;  Surgeon: Newman Pies, MD;  Location: [facility];  Service: Neurosurgery;  Laterality: Right;  Right Lumbar three-four microdiskectomy  . RADIOACTIVE SEED GUIDED PARTIAL MASTECTOMY WITH AXILLARY SENTINEL LYMPH NODE BIOPSY Left 12/29/2013   Procedure: LEFT BREAST RADIOACTIVE SEED LOCALIZED LUMPECTOMY WITH SENTINEL LYMPH NODE MAPPING;  Surgeon: Erroll Luna, MD;  Location: [facility];  Service: General;  Laterality: Left;   . RE-EXCISION OF BREAST LUMPECTOMY Left 03/02/2014   Procedure: RE-EXCISION OF LEFT BREAST LUMPECTOMY;  Surgeon: Erroll Luna, MD;  Location: [facility];  Service: General;  Laterality: Left;  . SALPINGOOPHORECTOMY  09/20/2013   Procedure: SALPINGO OOPHORECTOMY;  Surgeon: Osborne Oman, MD;  Location: [facility];  Service: Gynecology;;    I have reviewed the social history and family history with the patient and they are unchanged from previous note.  ALLERGIES:  has No Known Allergies.  MEDICATIONS:  Current Outpatient Medications  Medication Sig Dispense Refill  . acetaminophen (TYLENOL) 500 MG tablet Take 1,000 mg by mouth every 6 (six) hours as needed for headache (pain).    Marland Kitchen albuterol (VENTOLIN HFA) 108 (90 Base) MCG/ACT inhaler Inhale 2 puffs into the lungs every 6 (six) hours as needed for wheezing or shortness of breath. 8 g 3  . anastrozole (ARIMIDEX) 1 MG tablet Take 1 tablet (1 mg total) by mouth daily. 90 tablet 3  . Ascorbic Acid (VITAMIN C) 1000 MG tablet Take 1,000 mg by mouth daily.    Marland Kitchen aspirin EC 325 MG tablet Take 325 mg by mouth daily.    . Blood Glucose Monitoring Suppl (ONE TOUCH ULTRA MINI) w/Device KIT USE AS DIRECTED ONCE DAILY DX CODE E11.9 1 each 0  . cloNIDine (CATAPRES) 0.1 MG tablet TAKE  1 TABLET (0.1 MG TOTAL) BY MOUTH 2 (TWO) TIMES DAILY. 180 tablet 0  . [employer] SENNA PLUS 8.6-50 MG tablet TAKE 2 TABLETS BY MOUTH AT BEDTIME AS NEEDED FOR MILD CONSTIPATION (Patient taking differently: Take 1 tablet by mouth at bedtime. ) 60 tablet 11  . gabapentin (NEURONTIN) 300 MG capsule Take 2 capsules (600 mg total) by mouth 2 (two) times daily. 120 capsule 3  . glucose blood (ONE TOUCH ULTRA TEST) test strip USE AS DIRECTED ONCE DAILY DX CODE E11.9 100 each 12  . hydrochlorothiazide (HYDRODIURIL) 25 MG tablet Take 1 tablet (25 mg total) by mouth at bedtime. 30 tablet 6  . metFORMIN (GLUCOPHAGE) 500 MG tablet Take 1 tablet (500 mg total) by mouth 2 (two) times  daily. 180 tablet 3  . methocarbamol (ROBAXIN) 500 MG tablet TAKE 1 TABLET (500 MG TOTAL) BY MOUTH 4 (FOUR) TIMES DAILY. 60 tablet 2  . Multiple Vitamins-Minerals (MULTIVITAMIN WITH MINERALS) tablet Take 1 tablet by mouth daily. Reported on 03/13/2015    . omeprazole (PRILOSEC) 20 MG capsule TAKE 1 CAPSULE BY MOUTH EVERY DAY 90 capsule 0  . ONETOUCH DELICA LANCETS 47M MISC USE AS DIRECTED ONCE DAILY DX CODE E11.9 100 each 12  . potassium chloride (K-DUR) 10 MEQ tablet Take 1 tablet (10 mEq total) by mouth daily. 90 tablet 2  . pravastatin (PRAVACHOL) 20 MG tablet Take 1 tablet (20 mg total) by mouth daily. 90 tablet 3  . traMADol (ULTRAM) 50 MG tablet Take 1 tablet (50 mg total) by mouth every 4 (four) hours as needed. 120 tablet 0   No current facility-administered medications for this visit.     PHYSICAL EXAMINATION: ECOG PERFORMANCE STATUS: 1 - Symptomatic but completely ambulatory  No vitals taken today, Exam not performed today   LABORATORY DATA:  I have reviewed the data as listed CBC Latest Ref Rng & Units 03/09/2018 09/05/2017 05/09/2017  WBC 4.0 - 10.5 K/uL 7.4 7.0 6.1  Hemoglobin 12.0 - 15.0 g/dL 12.2 13.1 12.3  Hematocrit 36.0 - 46.0 % 38.8 39.3 36.9  Platelets 150 - 400 K/uL 255 236 248     CMP Latest Ref Rng & Units 03/18/2018 03/09/2018 01/22/2018  Glucose 65 - 99 mg/dL 124(H) 142(H) -  BUN 8 - 27 mg/dL 18 20 -  Creatinine 0.57 - 1.00 mg/dL 1.11(H) 1.12(H) -  Sodium 134 - 144 mmol/L 143 142 -  Potassium 3.5 - 5.2 mmol/L 4.0 3.6 4.2  Chloride 96 - 106 mmol/L 99 104 -  CO2 20 - 29 mmol/L 22 30 -  Calcium 8.7 - 10.3 mg/dL 10.0 9.4 -  Total Protein 6.5 - 8.1 g/dL - 7.0 -  Total Bilirubin 0.3 - 1.2 mg/dL - 0.4 -  Alkaline Phos 38 - 126 U/L - 57 -  AST 15 - 41 U/L - 28 -  ALT 0 - 44 U/L - 29 -      RADIOGRAPHIC STUDIES: I have personally reviewed the radiological images as listed and agreed with the findings in the report. No results found.   ASSESSMENT & PLAN:  Rachel Herman is a 68 y.o. female with   1. 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 Grade 2, Ki-67 73%. -She was diagnosed in 12/2013. She is status post complete surgical resection with lumpectomy, 12/29/13 -I previously discussed the Oncotype DX result with her and her husband. The recurrence score is 23, with the  predicted 10 year recurrence risk of 15% with tamoxifen alone. The benefit of chemotherapy is uncertain in this intermediate risk group. Adjuvant chemo was not recommended -She started adjuvant antiestrogen therapy with Exemestane in 06/2014, but stopped in 12/2016 due to high copay. She started Anastrozole in 05/2017 and tolerates moderately well with manageable joint pain and hot flashes.  -She is clinically doing well. She has no breast concerns. Her 02/2018 mammogram was unremarkable. There is no clinical concern for recurrence. -Continue surveillance. Next mammogram in 02/2019.  -Continue Anastrozole, plan to complete 5 years therapy at the end of 2021 -f/u in20month  2. Bone health -Her bone density scan was normal in 05/2014 -Normal bone density, T score -0.3 on 03/03/17 -She will continue calcium and vitamin D. -Will repeat in 02/2019.   3. Hypertension, diabetes, dyslipidemia, chronic low back pain -She will continue follow-up with her primary care physician. -HTN and DM well controlled.  -Chronic low back and b/l leg pain is stable. She tries to remain active in her garden.    4. Morbid obesity  -I discussed that Anastrozole can increase her appetite and weight. I encouraged her to reduce carbohydrates in her diet and try water exercise and walking due to her leg and back pain.  -I encouraged her to manage her weight.   5. right lung nodule, likely benign -She had a CT abdomen and pelvis in June 2016, which incidentally found a 4 mm nodule in the right lower lobe. I  personally reviewed the image.  -She never smoked, risk of lung cancer is low  -her repeated CT chest from 12/09/2014, which showed stable small lung nodules, likely benign. -She had CT chest on 08/18/2015 for chest pain, which showed stable 566mright lung base subpleural nodule, likely benign, no need to follow-up since she is a never smoker.   6. Thyroid Benign follicular nodule -Her CT scan revealed a 1.7 cm right thyroid nodule. -Ultrasound guided thyroid nodule biopsy on 05/09/2015 showed benign follicular nodule      PLAN: -She is clinically doing well and stable.  -Continue anastrozole -Mammogram and DEXA in 02/2019 -Lab and f/u in 6 months    No problem-specific Assessment & Plan notes found for this encounter.   Orders Placed This Encounter  Procedures  . MM DIAG BREAST TOMO BILATERAL    Standing Status:   Future    Standing Expiration Date:   09/07/2019    Order Specific Question:   Reason for Exam (SYMPTOM  OR DIAGNOSIS REQUIRED)    Answer:   screening    Order Specific Question:   [facility]    [facility]    Standing Status:   Future    Standing Expiration Date:   09/07/2019    Order Specific Question:   Reason for Exam (SYMPTOM  OR DIAGNOSIS REQUIRED)    Answer:   screening    Order Specific Question:   [facility]    [facility] I discussed the assessment and treatment plan with the patient. The patient was provided an opportunity to ask questions and all were answered. The patient agreed with the plan and demonstrated an understanding of the instructions.  The patient was advised to call back or seek an in-person evaluation if the symptoms worsen or if the condition fails to improve as anticipated.  I provided 15 minutes of non face-to-face telephone visit time during this encounter, and >  50% was spent counseling as documented under my assessment & plan.    Truitt Merle, MD  09/07/2018   I, Joslyn Devon, am acting as scribe for Truitt Merle, MD.   I have reviewed the above documentation for accuracy and completeness, and I agree with the above.

## 2018-09-07 ENCOUNTER — Encounter: Payer: Self-pay | Admitting: Hematology

## 2018-09-07 ENCOUNTER — Telehealth: Payer: Self-pay | Admitting: Hematology

## 2018-09-07 ENCOUNTER — Inpatient Hospital Stay: Payer: Medicare Other | Attending: Hematology | Admitting: Hematology

## 2018-09-07 ENCOUNTER — Inpatient Hospital Stay: Payer: Medicare Other

## 2018-09-07 DIAGNOSIS — E2839 Other primary ovarian failure: Secondary | ICD-10-CM | POA: Diagnosis not present

## 2018-09-07 DIAGNOSIS — I10 Essential (primary) hypertension: Secondary | ICD-10-CM | POA: Diagnosis not present

## 2018-09-07 DIAGNOSIS — C50212 Malignant neoplasm of upper-inner quadrant of left female breast: Secondary | ICD-10-CM

## 2018-09-07 DIAGNOSIS — Z170 Estrogen receptor positive status [ER+]: Secondary | ICD-10-CM | POA: Diagnosis not present

## 2018-09-07 NOTE — Telephone Encounter (Signed)
Scheduled appt per 8/3 los.  Printed and mailed appt calendar.

## 2018-09-15 DIAGNOSIS — Z86010 Personal history of colonic polyps: Secondary | ICD-10-CM | POA: Diagnosis not present

## 2018-09-15 DIAGNOSIS — K635 Polyp of colon: Secondary | ICD-10-CM | POA: Diagnosis not present

## 2018-09-15 DIAGNOSIS — Z800 Family history of malignant neoplasm of digestive organs: Secondary | ICD-10-CM | POA: Diagnosis not present

## 2018-09-15 DIAGNOSIS — K5730 Diverticulosis of large intestine without perforation or abscess without bleeding: Secondary | ICD-10-CM | POA: Diagnosis not present

## 2018-09-15 DIAGNOSIS — D125 Benign neoplasm of sigmoid colon: Secondary | ICD-10-CM | POA: Diagnosis not present

## 2018-09-16 DIAGNOSIS — H40013 Open angle with borderline findings, low risk, bilateral: Secondary | ICD-10-CM | POA: Diagnosis not present

## 2018-09-16 DIAGNOSIS — H2513 Age-related nuclear cataract, bilateral: Secondary | ICD-10-CM | POA: Diagnosis not present

## 2018-09-16 DIAGNOSIS — E119 Type 2 diabetes mellitus without complications: Secondary | ICD-10-CM | POA: Diagnosis not present

## 2018-09-20 ENCOUNTER — Other Ambulatory Visit: Payer: Self-pay | Admitting: Internal Medicine

## 2018-09-20 DIAGNOSIS — G8929 Other chronic pain: Secondary | ICD-10-CM

## 2018-10-19 ENCOUNTER — Telehealth: Payer: Self-pay

## 2018-10-19 ENCOUNTER — Emergency Department (HOSPITAL_COMMUNITY): Payer: Medicare Other

## 2018-10-19 ENCOUNTER — Other Ambulatory Visit: Payer: Self-pay

## 2018-10-19 ENCOUNTER — Encounter (HOSPITAL_COMMUNITY): Payer: Self-pay | Admitting: Pharmacy Technician

## 2018-10-19 ENCOUNTER — Emergency Department (HOSPITAL_COMMUNITY)
Admission: EM | Admit: 2018-10-19 | Discharge: 2018-10-19 | Disposition: A | Discharge status: Home / Self Care | Payer: Medicare Other | Attending: Emergency Medicine | Admitting: Emergency Medicine

## 2018-10-19 ENCOUNTER — Telehealth: Payer: Self-pay | Admitting: *Deleted

## 2018-10-19 DIAGNOSIS — Z79899 Other long term (current) drug therapy: Secondary | ICD-10-CM | POA: Insufficient documentation

## 2018-10-19 DIAGNOSIS — R0781 Pleurodynia: Secondary | ICD-10-CM | POA: Diagnosis not present

## 2018-10-19 DIAGNOSIS — I10 Essential (primary) hypertension: Secondary | ICD-10-CM | POA: Diagnosis not present

## 2018-10-19 DIAGNOSIS — E119 Type 2 diabetes mellitus without complications: Secondary | ICD-10-CM | POA: Diagnosis not present

## 2018-10-19 DIAGNOSIS — Z7984 Long term (current) use of oral hypoglycemic drugs: Secondary | ICD-10-CM | POA: Insufficient documentation

## 2018-10-19 DIAGNOSIS — R0789 Other chest pain: Secondary | ICD-10-CM | POA: Insufficient documentation

## 2018-10-19 DIAGNOSIS — R079 Chest pain, unspecified: Secondary | ICD-10-CM

## 2018-10-19 DIAGNOSIS — C50912 Malignant neoplasm of unspecified site of left female breast: Secondary | ICD-10-CM | POA: Diagnosis not present

## 2018-10-19 DIAGNOSIS — R112 Nausea with vomiting, unspecified: Secondary | ICD-10-CM | POA: Diagnosis not present

## 2018-10-19 DIAGNOSIS — R1110 Vomiting, unspecified: Secondary | ICD-10-CM | POA: Diagnosis not present

## 2018-10-19 LAB — HEPATIC FUNCTION PANEL
ALT: 44 U/L (ref 0–44)
AST: 48 U/L — ABNORMAL HIGH (ref 15–41)
Albumin: 3.9 g/dL (ref 3.5–5.0)
Alkaline Phosphatase: 52 U/L (ref 38–126)
Bilirubin, Direct: 0.1 mg/dL (ref 0.0–0.2)
Indirect Bilirubin: 0.4 mg/dL (ref 0.3–0.9)
Total Bilirubin: 0.5 mg/dL (ref 0.3–1.2)
Total Protein: 7.3 g/dL (ref 6.5–8.1)

## 2018-10-19 LAB — CBC
HCT: 43.6 % (ref 36.0–46.0)
Hemoglobin: 13.5 g/dL (ref 12.0–15.0)
MCH: 27.8 pg (ref 26.0–34.0)
MCHC: 31 g/dL (ref 30.0–36.0)
MCV: 89.9 fL (ref 80.0–100.0)
Platelets: 306 10*3/uL (ref 150–400)
RBC: 4.85 MIL/uL (ref 3.87–5.11)
RDW: 14.2 % (ref 11.5–15.5)
WBC: 8.9 10*3/uL (ref 4.0–10.5)
nRBC: 0 % (ref 0.0–0.2)

## 2018-10-19 LAB — BASIC METABOLIC PANEL
Anion gap: 12 (ref 5–15)
BUN: 21 mg/dL (ref 8–23)
CO2: 28 mmol/L (ref 22–32)
Calcium: 9.3 mg/dL (ref 8.9–10.3)
Chloride: 99 mmol/L (ref 98–111)
Creatinine, Ser: 1.07 mg/dL — ABNORMAL HIGH (ref 0.44–1.00)
GFR calc Af Amer: >60 mL/min (ref >60)
GFR calc non Af Amer: 53 mL/min — ABNORMAL LOW (ref >60)
Glucose, Bld: 127 mg/dL — ABNORMAL HIGH (ref 70–99)
Potassium: 3.9 mmol/L (ref 3.5–5.1)
Sodium: 139 mmol/L (ref 135–145)

## 2018-10-19 LAB — LIPASE, BLOOD: Lipase: 36 U/L (ref 11–51)

## 2018-10-19 LAB — TROPONIN I (HIGH SENSITIVITY)
Troponin I (High Sensitivity): 6 ng/L (ref <18)
Troponin I (High Sensitivity): 6 ng/L (ref <18)

## 2018-10-19 LAB — SALICYLATE LEVEL: Salicylate Lvl: <7 mg/dL (ref 2.8–30.0)

## 2018-10-19 LAB — ACETAMINOPHEN LEVEL: Acetaminophen (Tylenol), Serum: <10 ug/mL — ABNORMAL LOW (ref 10–30)

## 2018-10-19 LAB — D-DIMER, QUANTITATIVE: D-Dimer, Quant: 0.78 ug/mL-FEU — ABNORMAL HIGH (ref 0.00–0.50)

## 2018-10-19 MED ORDER — SODIUM CHLORIDE 0.9% FLUSH: 3.0000 mL | Freq: Once | INTRAVENOUS | Status: DC

## 2018-10-19 MED ORDER — IOHEXOL 350 MG/ML SOLN
100.0000 mL | Freq: Once | INTRAVENOUS | Status: AC | PRN
  Administered 2018-10-19: 100 mL via INTRAVENOUS

## 2018-10-19 NOTE — Discharge Instructions (Addendum)
Please follow up with oncologist regarding your pain.  Until then you can increase your gabapentin as we discussed.

## 2018-10-19 NOTE — ED Provider Notes (Signed)
[facility] EMERGENCY DEPARTMENT Provider Note   CSN: [id number] Arrival date & time: 10/19/18  1107     History   Chief Complaint Chief Complaint  Patient presents with   Chest Pain    HPI Rachel Herman is a 68 y.o. female with PMHx HTN, HLD, GERD, diabetes, breast cancer who presents to the ED today complaining of gradual onset, constant, left sided breast/chest pain x 3 weeks, worsening over the weekend. Pt states that the pain is now radiating into her right breast. She called her oncologist this AM and was told to come to the ED for further evaluation. Pt also endorses nausea and NBNB emesis x 8 days. She states she has been taking Tylenol "like candy" to help her go to sleep with this pain. Pt is not currently on any chemo or radiation; reports doing radiation in the past. Last mammogram was in Jan 2020. Pt is never smoker. No hx DVT/PE. No recent prolonged travel or immobilization. Denies fever, chills, shortness of breath, diaphoresis, leg swelling, diarrhea, constipation, or any other associated symptoms.        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   Diabetes mellitus ([place]) 09/16/13   Diagnosed on 09/16/13; HgA1C was 7.2/metformin   Dizziness    GERD (gastroesophageal reflux disease)    Headache    Hyperlipidemia    Hypertension 2014   Low back pain    Obesity, morbid, BMI 40.0-49.9 (HCC)    PONV (postoperative nausea and vomiting)    S/P radiation therapy 04/05/14-05/20/14   left breast 60.4Gy totaldose    Patient Active Problem List   Diagnosis Date Noted   Chronic cough 08/03/2018   Other intervertebral disc degeneration, lumbar region 03/18/2018   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/21/2016   Lumbar pain with radiation down left and right leg 07/02/2016   Thyroid nodule 06/09/2015   Nodule  of right lung 07/28/2014   Diverticulosis of colon without hemorrhage 07/28/2014   DJD (degenerative joint disease), lumbar 02/22/2014   Lumbar pain with radiation down left leg 01/17/2014   Breast cancer of upper-inner quadrant of left female breast ([place]) 01/05/2014   Hyperlipidemia associated with type 2 diabetes mellitus ([place]) 09/28/2013   S/P total hysterectomy and bilateral salpingo-oophorectomy 09/20/2013   Morbid obesity with body mass index of 40.0-44.9 in adult [facility]) 09/17/2013   Essential hypertension    Diabetes mellitus ([place]) 09/16/2013    Past Surgical History:  Procedure Laterality Date   ABDOMINAL HYSTERECTOMY N/A 09/20/2013   Procedure: HYSTERECTOMY ABDOMINAL;  Surgeon: Osborne Oman, MD;  Location: [facility];  Service: Gynecology;  Laterality: N/A;   BREAST LUMPECTOMY Left 2015   radiation   BREAST LUMPECTOMY WITH AXILLARY LYMPH NODE BIOPSY Left 12/29/13   left breast    LAPAROSCOPIC ASSISTED VAGINAL HYSTERECTOMY  09/20/2013   Procedure: LAPAROSCOPIC ASSISTED VAGINAL HYSTERECTOMY;  Surgeon: Osborne Oman, MD;  Location: [facility];  Service: Gynecology;;  PT WAS EXAMINED WHILE UP IN [place] AND IT WAS DECIDED TO OPEN PT DUE TO LARGE MASS   LUMBAR LAMINECTOMY/DECOMPRESSION MICRODISCECTOMY Right 12/14/2014   Procedure: Right Lumbar three-four microdiskectomy;  Surgeon: Newman Pies, MD;  Location: [facility];  Service: Neurosurgery;  Laterality: Right;  Right Lumbar three-four microdiskectomy   RADIOACTIVE SEED GUIDED PARTIAL MASTECTOMY WITH AXILLARY SENTINEL LYMPH NODE BIOPSY Left 12/29/2013   Procedure: LEFT BREAST RADIOACTIVE  SEED LOCALIZED LUMPECTOMY WITH SENTINEL LYMPH NODE MAPPING;  Surgeon: Erroll Luna, MD;  Location: [facility];  Service: General;  Laterality: Left;   RE-EXCISION OF BREAST LUMPECTOMY Left 03/02/2014   Procedure: RE-EXCISION OF LEFT BREAST LUMPECTOMY;  Surgeon: Erroll Luna, MD;  Location: [facility];  Service: General;  Laterality: Left;   SALPINGOOPHORECTOMY  09/20/2013   Procedure: SALPINGO OOPHORECTOMY;  Surgeon: Osborne Oman, MD;  Location: [facility];  Service: Gynecology;;     OB History    Gravida  3   Para  3   Term  3   Preterm  0   AB  0   Living  3     SAB  0   TAB  0   Ectopic  0   Multiple  0   Live Births               Home Medications    Prior to Admission medications   Medication Sig Start Date End Date Taking? Authorizing Provider  acetaminophen (TYLENOL) 500 MG tablet Take 1,000 mg by mouth every 6 (six) hours as needed for headache (pain).   Yes [provider]  albuterol (VENTOLIN HFA) 108 (90 Base) MCG/ACT inhaler Inhale 2 puffs into the lungs every 6 (six) hours as needed for wheezing or shortness of breath. 07/27/18  Yes Ladell Pier, MD  anastrozole (ARIMIDEX) 1 MG tablet Take 1 tablet (1 mg total) by mouth daily. 03/09/18  Yes Truitt Merle, MD  Ascorbic Acid (VITAMIN C) 1000 MG tablet Take 1,000 mg by mouth daily.   Yes [provider]  aspirin EC 325 MG tablet Take 325 mg by mouth daily.   Yes [provider]  cloNIDine (CATAPRES) 0.1 MG tablet TAKE 1 TABLET (0.1 MG TOTAL) BY MOUTH 2 (TWO) TIMES DAILY. 08/14/18  Yes Ladell Pier, MD  [employer] SENNA PLUS 8.6-50 MG tablet TAKE 2 TABLETS BY MOUTH AT BEDTIME AS NEEDED FOR MILD CONSTIPATION Patient taking differently: Take 1 tablet by mouth at bedtime.  07/11/16  Yes Funches, Josalyn, MD  gabapentin (NEURONTIN) 300 MG capsule TAKE 2 CAPSULES (600 MG TOTAL) BY MOUTH 2 (TWO) TIMES DAILY. 09/22/18  Yes Ladell Pier, MD  hydrochlorothiazide (HYDRODIURIL) 25 MG tablet Take 1 tablet (25 mg total) by mouth at bedtime. 07/27/18  Yes Ladell Pier, MD  metFORMIN (GLUCOPHAGE) 500 MG tablet Take 1 tablet (500 mg total) by mouth 2 (two) times daily. 03/18/18  Yes Elsie Stain, MD  methocarbamol (ROBAXIN) 500 MG tablet TAKE 1 TABLET (500 MG TOTAL) BY MOUTH 4  (FOUR) TIMES DAILY. Patient taking differently: Take 500 mg by mouth 2 (two) times daily as needed for muscle spasms.  09/02/18  Yes Ladell Pier, MD  Multiple Vitamins-Minerals (MULTIVITAMIN WITH MINERALS) tablet Take 1 tablet by mouth daily. Reported on 03/13/2015   Yes [provider]  omeprazole (PRILOSEC) 20 MG capsule TAKE 1 CAPSULE BY MOUTH EVERY DAY Patient taking differently: Take 20 mg by mouth daily.  07/16/18  Yes Ladell Pier, MD  potassium chloride (K-DUR) 10 MEQ tablet Take 1 tablet (10 mEq total) by mouth daily. 03/18/18  Yes Elsie Stain, MD  pravastatin (PRAVACHOL) 20 MG tablet Take 1 tablet (20 mg total) by mouth daily. 03/18/18  Yes Elsie Stain, MD  traMADol (ULTRAM) 50 MG tablet Take 1 tablet (50 mg total) by mouth every 4 (four) hours as needed. 09/01/18  Yes Ladell Pier, MD  Blood Glucose Monitoring Suppl (ONE TOUCH ULTRA MINI) w/Device KIT USE AS DIRECTED ONCE DAILY DX CODE E11.9 11/10/17   Ladell Pier, MD  glucose blood (ONE TOUCH ULTRA TEST) test strip USE AS DIRECTED ONCE DAILY DX CODE E11.9 11/10/17   Ladell Pier, MD  [facility] DELICA LANCETS 81K MISC USE AS DIRECTED ONCE DAILY DX CODE E11.9 11/10/17   Ladell Pier, MD    Family History Family History  Problem Relation Age of Onset   Diabetes Mother    Multiple myeloma Mother 79   Hearing loss Mother    Diabetes Brother    Cancer Brother 39       prostate cancer    Diabetes Maternal Aunt    Leukemia Maternal Aunt        dx in her 32s   Alcoholism Brother    Heart attack Father    Diabetes Sister    Diabetes Son     Social History Social History   Tobacco Use   Smoking status: Never Smoker   Smokeless tobacco: Never Used  Substance Use Topics   Alcohol use: No   Drug use: No     Allergies   Patient has no known allergies.   Review of Systems Review of Systems  Constitutional: Negative for chills and fever.  HENT: Negative for  congestion.   Eyes: Negative for visual disturbance.  Respiratory: Negative for cough and shortness of breath.   Cardiovascular: Positive for chest pain. Negative for leg swelling.  Gastrointestinal: Positive for nausea and vomiting. Negative for constipation and diarrhea.  Genitourinary: Negative for difficulty urinating and frequency.  Musculoskeletal: Negative for myalgias.  Skin: Negative for rash.  Neurological: Negative for headaches.     Physical Exam Updated Vital Signs BP (!) 158/91    Pulse 99    Temp 98.6 F (37 C) (Oral)    Resp 20    Ht '5\' 7"'  (1.702 m)    Wt 97.5 kg    SpO2 99%    BMI 33.67 kg/m   Physical Exam Vitals signs and nursing note reviewed.  Constitutional:      Appearance: She is obese. She is not ill-appearing.  HENT:     Head: Normocephalic and atraumatic.  Eyes:     Conjunctiva/sclera: Conjunctivae normal.  Neck:     Musculoskeletal: Neck supple.  Cardiovascular:     Rate and Rhythm: Normal rate and regular rhythm.     Pulses:          Radial pulses are 2+ on the right side and 2+ on the left side.       Dorsalis pedis pulses are 2+ on the right side and 2+ on the left side.     Heart sounds: Normal heart sounds.  Pulmonary:     Effort: Pulmonary effort is normal.     Breath sounds: Normal breath sounds. No decreased breath sounds, wheezing, rhonchi or rales.  Chest:     Chest wall: Tenderness present.  Abdominal:     Palpations: Abdomen is soft.     Tenderness: There is no abdominal tenderness.  Musculoskeletal:     Right lower leg: No edema.     Left lower leg: No edema.  Skin:    General: Skin is warm and dry.  Neurological:     Mental Status: She is alert.      ED Treatments / Results  Labs (all labs ordered are listed, but only abnormal results are displayed) Labs Reviewed  BASIC METABOLIC PANEL - Abnormal; Notable for the following components:      Result Value   Glucose, Bld 127 (*)    Creatinine, Ser 1.07 (*)    GFR calc  non Af Amer 53 (*)    All other components within normal limits  D-DIMER, QUANTITATIVE (NOT AT [facility]) - Abnormal; Notable for the following components:   D-Dimer, Quant 0.78 (*)    All other components within normal limits  HEPATIC FUNCTION PANEL - Abnormal; Notable for the following components:   AST 48 (*)    All other components within normal limits  ACETAMINOPHEN LEVEL - Abnormal; Notable for the following components:   Acetaminophen (Tylenol), Serum <10 (*)    All other components within normal limits  CBC  LIPASE, BLOOD  SALICYLATE LEVEL  TROPONIN I (HIGH SENSITIVITY)  TROPONIN I (HIGH SENSITIVITY)    EKG EKG Interpretation  Date/Time:  Monday October 19 2018 11:16:46 EDT Ventricular Rate:  93 PR Interval:  200 QRS Duration: 72 QT Interval:  362 QTC Calculation: 450 R Axis:   56 Text Interpretation:  Normal sinus rhythm Non-specific ST-t changes Confirmed by Lajean Saver [id number]) on 10/19/2018 12:01:44 PM   Radiology Dg Chest 2 View  Result Date: 10/19/2018 CLINICAL DATA:  68 year old female with chest pain EXAM: CHEST - 2 VIEW COMPARISON:  Head chest radiograph dated 08/03/2018 FINDINGS: The lungs are clear. There is no pleural effusion or pneumothorax. The cardiac silhouette is within normal limits. Degenerative changes of the spine. No acute osseous pathology. IMPRESSION: No active cardiopulmonary disease. Electronically Signed   By: Anner Crete M.D.   On: 10/19/2018 11:34    Procedures Procedures (including critical care time)  Medications Ordered in ED Medications  sodium chloride flush (NS) 0.9 % injection 3 mL (3 mLs Intravenous Not Given 10/19/18 1226)     Initial Impression / Assessment and Plan / ED Course  I have reviewed the triage vital signs and the nursing notes.  Pertinent labs & imaging results that were available during my care of the patient were reviewed by me and considered in my medical decision making (see chart for details).  67  year old female who presents with complaints of left sided chest/breast pain x a couple of weeks. Hx of breast cancer currently. Last mammogram in Jan 2020. Called oncologist to be seen today but was sent to the ED for further evaluation. Pt states pain is pleuritic in nature. Given hx of active malignancy there is concern for PE/cannot PERC out. Will obtain D dimer today and rule out ACS. Acetaminophen level and salicylate ordered as pt admitted to "eating tylenol like candy" due to pain. Will check LFTs as well.   EKG with nonspec ST changes. CXR clear. Initial trop of 6, will repeat. D dimer elevated at 0.78. Cannot age adjust. Will obtain CTA at this time. Pt initially had not complained of abdominal pain but is tender diffusely on exam. Will add CT A/P at this time.  Remainder of bloodwork reassuring. LFTs without any abnormalities. No electrolyte abnormalities on BMP. Lipase negative. Repeat trop of 6. Awaiting scans at this time.   4:22 PM At shift change case signed out to Dr. Sedonia Small who will dispo patient accordingly. If CT scans negative pt can be discharged home with oncology follow up.   Clinical Course as of Oct 18 1628  Mon Oct 19, 2018  1214 D-Dimer, Quant(!): 0.78 [MV]  1504 Baseline  Creatinine(!): 1.07 [MV]    Clinical Course  User Index [MV] Eustaquio Maize, PA-C   This note was prepared using Dragon voice recognition software and may include unintentional dictation errors due to the inherent limitations of voice recognition software.        Final Clinical Impressions(s) / ED Diagnoses   Final diagnoses:  Nonspecific chest pain  Malignant neoplasm of left female breast, unspecified estrogen receptor status, unspecified site of breast [facility])    ED Discharge Orders    None       Eustaquio Maize, PA-C 10/19/18 1630    Lajean Saver, MD 10/20/18 0800

## 2018-10-19 NOTE — ED Provider Notes (Signed)
  Provider Note MRN:  [id number]  Arrival date & time: 10/19/18    ED Course and Medical Decision Making  Assumed care from provider Venter at shift change.  History of breast cancer, chest pain for 3 weeks, awaiting CTs of the chest and abdomen.  Reassessment thereafter, a candidate for discharge if nonacute.  6:15 PM update: Work-up is unrevealing, no acute pathology.  Patient continues to have tenderness to the left breast, favoring neuropathic pain.  On my exam, chaperoned by female nurse, no rash to suggest shingles.  Appropriate for outpatient management.  Final Clinical Impressions(s) / ED Diagnoses     ICD-10-CM   1. Nonspecific chest pain  R07.9   2. Malignant neoplasm of left female breast, unspecified estrogen receptor status, unspecified site of breast ([place])  C50.912     ED Discharge Orders    None        Discharge Instructions     Please follow up with oncologist regarding your pain.  Until then you can increase your gabapentin as we discussed.     Barth Kirks. Sedonia Small, [place] [email or web address]    Maudie Flakes, MD 10/19/18 [phone]

## 2018-10-19 NOTE — Telephone Encounter (Signed)
Spoke with patient regarding her chest pain and left arm pain, advised her to be seen at either [facility] or [facility] emergency department now.  She verbalized an understanding and stated she will go.

## 2018-10-19 NOTE — ED Notes (Signed)
Pt ambulatory to bathroom without any problems 

## 2018-10-19 NOTE — Telephone Encounter (Signed)
MA spoke with oncology triage who shared they spoke with the patient this morning and advised her to report to the ED due to the fact that she was screaming and crying during the triage call. MA was unable to have the patient scheduled for a sooner appointment and was only advised to reiterate the need for the patient to report to the ED.

## 2018-10-19 NOTE — Telephone Encounter (Signed)
Patient calls with complaint of severe left breast/chest and left arm pain now in the right side of her chest.  I informed the patient she needs to be evaluated at either [facility] or [facility] emergency department now as we cannot rule out a cardiovascular issue.  She verbalized an understanding and states she will go now.

## 2018-10-19 NOTE — ED Triage Notes (Signed)
Pt arrives via pov with reports of L sided CP radiating to the R. Worsened over the weekend. Hx breast cancer.

## 2018-10-19 NOTE — Telephone Encounter (Signed)
"  [facility] ([facility] [phone]).  Courtesy call for Rachel Herman who reports unable to reach your office.  Requests being seen in our office for left breast pain now experiencing also in right breast would be best served by one of her providers there."  Informed [facility] of collaborative nurse communication with patient today advising ED evaluation.  Patient communication demonstrates distress with report of new right breast/chest pain.

## 2018-10-19 NOTE — ED Notes (Signed)
Pt to CT at this time.

## 2018-10-20 ENCOUNTER — Telehealth: Payer: Self-pay | Admitting: Nurse Practitioner

## 2018-10-20 ENCOUNTER — Telehealth: Payer: Self-pay

## 2018-10-20 NOTE — Telephone Encounter (Signed)
Scheduled appt per 9/15 sch message - pt aware of appt date and time

## 2018-10-20 NOTE — Telephone Encounter (Signed)
I can see her this week in clinic, I'll send a schedule message.  Thanks, Regan Rakers

## 2018-10-20 NOTE — Telephone Encounter (Signed)
Rachel Herman with [facility] called stating that the patient was seen in the ED at [facility] for breast/chest pain and told to follow up with her oncologist.  Hurts to take a deep breath, sometimes can't eat or gets nauseated from the pain.   Patient's [phone]

## 2018-10-21 NOTE — Progress Notes (Signed)
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 Ultrasound of the left axilla is negative.      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    12/29/2013 Surgery   Left lumpectomy with close (<0.1cm) inferior margin   03/02/2014 Surgery   Reexcision for close margin, path negative for malignancy.   04/05/2014 - 05/20/2014 Radiation Therapy   adjuvant breast radiation    05/27/2014 Imaging   Bone density scan: normal    06/07/2014 -  Anti-estrogen oral therapy   Exemestane 25 mg daily, she stopped in Nov 2018 due to high copay. Change anastrozole on 05/09/2017    06/20/2015 Imaging   Bone scan 06/20/2015 IMPRESSION: No evidence of metastatic disease. Mild increased activity dorsal aspect right midfoot probable degenerative in nature. Clinical correlation is necessary.   02/29/2016 Mammogram   MM DIAG BREAST TOMO BIALTERAL 02/29/16 IMPRESSION: Stable left breast lumpectomy site. No mammographic evidence of  malignancy in the bilateral breasts.   03/03/2017 Mammogram   IMPRESSION: 1. No mammographic evidence of malignancy in either breast. 2. Stable left breast posttreatment changes.     CURRENT THERAPY: Exemestane '25mg'$  daily, started on 06/07/2014. She stopped in Nov 2018 due to high copay. Changed toanastrozole on 05/09/2017. Plan to complete at end of 2021.  INTERVAL HISTORY: Rachel Herman returns for ED f/u. She presented to ED on 10/19/18 for worsening left breast/chest pain that radiated to right chest. EKG with nonspecific T wave changes. Troponins normal. D-dimer elevated so CTA was obtained that was negative for PE. ABD tender on exam so CT AP was done, also unremarkable. She was discharged home with oncology f/u.   Today she is doing OK. She reports pain in the entire left breast and axilla. Pain began spontaneously 1 month ago and worsened over past several days. Denies precipitating event, fall, injury, strain. No encounter with airbag deployment. Denies new mass, rash, breast swelling or redness; no nipple discharge or inversion. Pain limits some activities such as clasping bra. She has occasional sternal pain and pain in lower right breast x1 month. Takes medication for GERD. Does not think this is anxiety or reflux. Gabapentin was increased to 900 mg BID and this has helped some. Takes tramadol for chronic back pain which has also helped breast pain.   Otherwise, she continues anastrozole. Hot flashes are stable. Intermittent cough is better with inhaler. Denies chest pain or dyspnea. Appetite is normal. Denies change in bowel habits.    MEDICAL HISTORY:  Past Medical History:  Diagnosis Date  .  Breast cancer ([place]) 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  . Diabetes mellitus ([place]) 09/16/13   Diagnosed on 09/16/13; HgA1C was 7.2/metformin  . Dizziness   . GERD (gastroesophageal reflux disease)   . Headache   . Hyperlipidemia   . Hypertension 2014  . Low back pain   .  Obesity, morbid, BMI 40.0-49.9 ([place])   . PONV (postoperative nausea and vomiting)   . S/P radiation therapy 04/05/14-05/20/14   left breast 60.4Gy totaldose    SURGICAL HISTORY: Past Surgical History:  Procedure Laterality Date  . ABDOMINAL HYSTERECTOMY N/A 09/20/2013   Procedure: HYSTERECTOMY ABDOMINAL;  Surgeon: Osborne Oman, MD;  Location: [facility];  Service: Gynecology;  Laterality: N/A;  . BREAST LUMPECTOMY Left 2015   radiation  . BREAST LUMPECTOMY WITH AXILLARY LYMPH NODE BIOPSY Left 12/29/13   left breast   . LAPAROSCOPIC ASSISTED VAGINAL HYSTERECTOMY  09/20/2013   Procedure: LAPAROSCOPIC ASSISTED VAGINAL HYSTERECTOMY;  Surgeon: Osborne Oman, MD;  Location: [facility];  Service: Gynecology;;  PT WAS EXAMINED WHILE UP IN [place] AND IT WAS DECIDED TO OPEN PT DUE TO LARGE MASS  . LUMBAR LAMINECTOMY/DECOMPRESSION MICRODISCECTOMY Right 12/14/2014   Procedure: Right Lumbar three-four microdiskectomy;  Surgeon: Newman Pies, MD;  Location: [facility];  Service: Neurosurgery;  Laterality: Right;  Right Lumbar three-four microdiskectomy  . RADIOACTIVE SEED GUIDED PARTIAL MASTECTOMY WITH AXILLARY SENTINEL LYMPH NODE BIOPSY Left 12/29/2013   Procedure: LEFT BREAST RADIOACTIVE SEED LOCALIZED LUMPECTOMY WITH SENTINEL LYMPH NODE MAPPING;  Surgeon: Erroll Luna, MD;  Location: [facility];  Service: General;  Laterality: Left;  . RE-EXCISION OF BREAST LUMPECTOMY Left 03/02/2014   Procedure: RE-EXCISION OF LEFT BREAST LUMPECTOMY;  Surgeon: Erroll Luna, MD;  Location: [facility];  Service: General;  Laterality: Left;  . SALPINGOOPHORECTOMY  09/20/2013   Procedure: SALPINGO OOPHORECTOMY;  Surgeon: Osborne Oman, MD;  Location: [facility];  Service: Gynecology;;    I have reviewed the social history and family history with the patient and they are unchanged from previous note.  ALLERGIES:  has No Known Allergies.  MEDICATIONS:  Current Outpatient Medications   Medication Sig Dispense Refill  . acetaminophen (TYLENOL) 500 MG tablet Take 1,000 mg by mouth every 6 (six) hours as needed for headache (pain).    Marland Kitchen albuterol (VENTOLIN HFA) 108 (90 Base) MCG/ACT inhaler Inhale 2 puffs into the lungs every 6 (six) hours as needed for wheezing or shortness of breath. 8 g 3  . anastrozole (ARIMIDEX) 1 MG tablet Take 1 tablet (1 mg total) by mouth daily. 90 tablet 3  . Ascorbic Acid (VITAMIN C) 1000 MG tablet Take 1,000 mg by mouth daily.    Marland Kitchen aspirin EC 325 MG tablet Take 325 mg by mouth daily.    . Blood Glucose Monitoring Suppl (ONE TOUCH ULTRA MINI) w/Device KIT USE AS DIRECTED ONCE DAILY DX CODE E11.9 1 each 0  . cloNIDine (CATAPRES) 0.1 MG tablet TAKE 1 TABLET (0.1 MG TOTAL) BY MOUTH 2 (TWO) TIMES DAILY. 180 tablet 0  . [employer] SENNA PLUS 8.6-50 MG tablet TAKE 2 TABLETS BY MOUTH AT BEDTIME AS NEEDED FOR MILD CONSTIPATION (Patient taking differently: Take 1 tablet by mouth at bedtime. ) 60 tablet 11  . gabapentin (NEURONTIN) 300 MG capsule TAKE 2 CAPSULES (600 MG TOTAL) BY MOUTH 2 (TWO) TIMES DAILY. 360 capsule 2  . glucose blood (ONE TOUCH ULTRA TEST) test strip USE AS DIRECTED ONCE DAILY DX CODE E11.9 100 each 12  .  hydrochlorothiazide (HYDRODIURIL) 25 MG tablet Take 1 tablet (25 mg total) by mouth at bedtime. 30 tablet 6  . metFORMIN (GLUCOPHAGE) 500 MG tablet Take 1 tablet (500 mg total) by mouth 2 (two) times daily. 180 tablet 3  . methocarbamol (ROBAXIN) 500 MG tablet TAKE 1 TABLET (500 MG TOTAL) BY MOUTH 4 (FOUR) TIMES DAILY. (Patient taking differently: Take 500 mg by mouth 2 (two) times daily as needed for muscle spasms. ) 60 tablet 2  . Multiple Vitamins-Minerals (MULTIVITAMIN WITH MINERALS) tablet Take 1 tablet by mouth daily. Reported on 03/13/2015    . omeprazole (PRILOSEC) 20 MG capsule TAKE 1 CAPSULE BY MOUTH EVERY DAY (Patient taking differently: Take 20 mg by mouth daily. ) 90 capsule 0  . ONETOUCH DELICA LANCETS 57Q MISC USE AS DIRECTED ONCE DAILY DX  CODE E11.9 100 each 12  . potassium chloride (K-DUR) 10 MEQ tablet Take 1 tablet (10 mEq total) by mouth daily. 90 tablet 2  . pravastatin (PRAVACHOL) 20 MG tablet Take 1 tablet (20 mg total) by mouth daily. 90 tablet 3  . traMADol (ULTRAM) 50 MG tablet Take 1 tablet (50 mg total) by mouth every 4 (four) hours as needed. 120 tablet 0   No current facility-administered medications for this visit.     PHYSICAL EXAMINATION:  Vitals:   10/22/18 0957  BP: 124/89  Pulse: 87  Resp: 18  Temp: 98.7 F (37.1 C)  SpO2: 100%   Filed Weights   10/22/18 0957  Weight: 220 lb 1.6 oz (99.8 kg)    GENERAL:alert, no distress and comfortable SKIN: no obvious rash  EYES: sclera clear LYMPH:  no palpable cervical or supraclavicular lymphadenopathy  LUNGS: clear to auscultation with normal breathing effort HEART: regular rate & rhythm, no lower extremity edema Musculoskeletal:no cyanosis of digits. Slightly limited LUE ROM  NEURO: alert & oriented x 3 with fluent speech Breast exam: s/p left lumpectomy and radiation. Incision has completely healed. No nodularity along the scar. Inferior to the surgical scar, there is mild thickening and tenderness to lower inner quadrant without discrete mass. No rash, erythema, or warmth. No nipple inversion or discharge. No other palpable mass in either breast or axilla that I could appreciate   LABORATORY DATA:  I have reviewed the data as listed CBC Latest Ref Rng & Units 10/19/2018 03/09/2018 09/05/2017  WBC 4.0 - 10.5 K/uL 8.9 7.4 7.0  Hemoglobin 12.0 - 15.0 g/dL 13.5 12.2 13.1  Hematocrit 36.0 - 46.0 % 43.6 38.8 39.3  Platelets 150 - 400 K/uL 306 255 236     CMP Latest Ref Rng & Units 10/19/2018 03/18/2018 03/09/2018  Glucose 70 - 99 mg/dL 127(H) 124(H) 142(H)  BUN 8 - 23 mg/dL _0 Creatinine 0.44 - 1.00 mg/dL 1.07(H) 1.11(H) 1.12(H)  Sodium 135 - 145 mmol/L 139 143 142  Potassium 3.5 - 5.1 mmol/L 3.9 4.0 3.6  Chloride 98 - 111 mmol/L 99 99 104  CO2  22 - 32 mmol/L _1 Calcium 8.9 - 10.3 mg/dL 9.3 10.0 9.4  Total Protein 6.5 - 8.1 g/dL 7.3 - 7.0  Total Bilirubin 0.3 - 1.2 mg/dL 0.5 - 0.4  Alkaline Phos 38 - 126 U/L 52 - 57  AST 15 - 41 U/L 48(H) - 28  ALT 0 - 44 U/L 44 - 29      RADIOGRAPHIC STUDIES: I have personally reviewed the radiological images as listed and agreed with the findings in the report. No results found.  ASSESSMENT & PLAN: 68 yo female with:  1. Acute left breast pain  -acute onset breast pain x1 month without precipitating event.  -no obvious etiology seen on CT CAP on 10/19/18  -Exam shows thickening and tenderness in the lower inner quadrant of left breast, inferior to the lumpectomy incision. No signs of infection or cellulitis.  -Differential includes scar tissue vs seroma vs mass.  -pain improved with increased gabapentin, suggesting neuropathic component -She is being referred for diagnostic mammogram and US of the left breast for further evaluation. I will call her with the result.   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 Grade 2, Ki-67 73%. -Diagnosed in 12/2013.S/p complete surgical resection with lumpectomy, 12/29/13 -Oncotype DX recurrence score is 23, with the predicted 10 year recurrence risk of 15% with tamoxifen alone. The benefit of chemotherapy is uncertain in this intermediate risk group.Adjuvant chemo was not recommended -She started adjuvant antiestrogen therapy withExemestanein 06/2014, but stopped in 12/2016 due to high copay. She started Anastrozolein 05/2017 and toleratesmoderatelywell with manageable joint painand hot flashes. plan to continue until end of 2021 for total 5 years  -mammogram in 02/2018 was negative -continue AI -routine f/u in 03/2019, or sooner if work up for breast pain is abnormal  3. Bone health -Continues calcium and vitamin D. Repeat DEXA 2021  4. HTN,  DM, HL, obesity, low pack pain -continue f/u with PCP   PLAN: -diagnostic left mammogram and [place] scheduled on 9/21 for acute left breast pain in the setting of left breast cancer in 2015 s/p lumpectomy, adjuvant RT and current AI therapy -f/u with results -routine f/u in 03/2019, sooner if work up is abnormal -she received flu vaccine in the right deltoid in clinic today  Orders Placed This Encounter  Procedures  . MM DIAG BREAST TOMO UNI LEFT    [facility] Pf-03/04/18_0  No needs/yes hx br ca/no implants or br red ec/office    Standing Status:   Future    Standing Expiration Date:   10/22/2019    Order Specific Question:   Reason for Exam (SYMPTOM  OR DIAGNOSIS REQUIRED)    Answer:   h.o left breast cancer, 1 month left breast/axillary pain, tenderness in lower inner quadrant    Order Specific Question:   Preferred imaging location?    Answer:   [facility]  . US BREAST LTD UNI LEFT INC AXILLA    [facility] Pf-03/04/18_1  No needs/yes hx br ca/no implants or br red ec/office    Standing Status:   Future    Standing Expiration Date:   12/22/2019    Order Specific Question:   Reason for Exam (SYMPTOM  OR DIAGNOSIS REQUIRED)    Answer:   h/o left breast cancer, 1 month left breast pain, tenderness in lower inner quadrant    Order Specific Question:   Preferred imaging location?    Answer:   [facility]   All questions were answered. The patient knows to call the clinic with any problems, questions or concerns. No barriers to learning was detected. I spent 20 minutes counseling the patient face to face. The total time spent in the appointment was 25 minutes and more than 50% was on counseling and review of test results     Alla Feeling, NP 10/22/18

## 2018-10-22 ENCOUNTER — Other Ambulatory Visit: Payer: Self-pay

## 2018-10-22 ENCOUNTER — Inpatient Hospital Stay: Payer: Medicare Other | Attending: Hematology | Admitting: Nurse Practitioner

## 2018-10-22 ENCOUNTER — Encounter: Payer: Self-pay | Admitting: Nurse Practitioner

## 2018-10-22 VITALS — BP 124/89 | HR 87 | Temp 98.7°F | Resp 18 | Ht 67.0 in | Wt 220.1 lb

## 2018-10-22 DIAGNOSIS — E785 Hyperlipidemia, unspecified: Secondary | ICD-10-CM | POA: Diagnosis not present

## 2018-10-22 DIAGNOSIS — R7989 Other specified abnormal findings of blood chemistry: Secondary | ICD-10-CM | POA: Insufficient documentation

## 2018-10-22 DIAGNOSIS — Z79899 Other long term (current) drug therapy: Secondary | ICD-10-CM | POA: Insufficient documentation

## 2018-10-22 DIAGNOSIS — K219 Gastro-esophageal reflux disease without esophagitis: Secondary | ICD-10-CM | POA: Insufficient documentation

## 2018-10-22 DIAGNOSIS — Z23 Encounter for immunization: Secondary | ICD-10-CM | POA: Insufficient documentation

## 2018-10-22 DIAGNOSIS — R232 Flushing: Secondary | ICD-10-CM | POA: Diagnosis not present

## 2018-10-22 DIAGNOSIS — Z923 Personal history of irradiation: Secondary | ICD-10-CM | POA: Diagnosis not present

## 2018-10-22 DIAGNOSIS — N644 Mastodynia: Secondary | ICD-10-CM | POA: Diagnosis not present

## 2018-10-22 DIAGNOSIS — M549 Dorsalgia, unspecified: Secondary | ICD-10-CM | POA: Insufficient documentation

## 2018-10-22 DIAGNOSIS — E119 Type 2 diabetes mellitus without complications: Secondary | ICD-10-CM | POA: Diagnosis not present

## 2018-10-22 DIAGNOSIS — G8929 Other chronic pain: Secondary | ICD-10-CM | POA: Insufficient documentation

## 2018-10-22 DIAGNOSIS — M545 Low back pain: Secondary | ICD-10-CM | POA: Insufficient documentation

## 2018-10-22 DIAGNOSIS — Z170 Estrogen receptor positive status [ER+]: Secondary | ICD-10-CM | POA: Diagnosis not present

## 2018-10-22 DIAGNOSIS — I10 Essential (primary) hypertension: Secondary | ICD-10-CM | POA: Diagnosis not present

## 2018-10-22 DIAGNOSIS — R079 Chest pain, unspecified: Secondary | ICD-10-CM | POA: Insufficient documentation

## 2018-10-22 DIAGNOSIS — R05 Cough: Secondary | ICD-10-CM | POA: Diagnosis not present

## 2018-10-22 DIAGNOSIS — Z79811 Long term (current) use of aromatase inhibitors: Secondary | ICD-10-CM | POA: Insufficient documentation

## 2018-10-22 DIAGNOSIS — C50212 Malignant neoplasm of upper-inner quadrant of left female breast: Secondary | ICD-10-CM | POA: Diagnosis not present

## 2018-10-22 MED ORDER — INFLUENZA VAC A&B SA ADJ QUAD 0.5 ML IM PRSY
0.5000 mL | PREFILLED_SYRINGE | Freq: Once | INTRAMUSCULAR | Status: AC
  Administered 2018-10-22: 0.5 mL via INTRAMUSCULAR

## 2018-10-22 MED ORDER — INFLUENZA VAC A&B SA ADJ QUAD 0.5 ML IM PRSY
PREFILLED_SYRINGE | INTRAMUSCULAR | Status: AC
  Filled 2018-10-22: qty 0.5

## 2018-10-23 ENCOUNTER — Telehealth: Payer: Self-pay | Admitting: Nurse Practitioner

## 2018-10-23 NOTE — Telephone Encounter (Signed)
No los per 9/17 °

## 2018-10-26 ENCOUNTER — Ambulatory Visit: Admission: RE | Admit: 2018-10-26 | Payer: Medicare Other | Source: Ambulatory Visit

## 2018-10-26 ENCOUNTER — Ambulatory Visit
Admission: RE | Admit: 2018-10-26 | Discharge: 2018-10-26 | Disposition: A | Discharge status: Home / Self Care | Payer: Medicare Other | Source: Ambulatory Visit | Attending: Nurse Practitioner | Admitting: Nurse Practitioner

## 2018-10-26 ENCOUNTER — Other Ambulatory Visit: Payer: Self-pay

## 2018-10-26 DIAGNOSIS — N644 Mastodynia: Secondary | ICD-10-CM

## 2018-10-26 DIAGNOSIS — R928 Other abnormal and inconclusive findings on diagnostic imaging of breast: Secondary | ICD-10-CM | POA: Diagnosis not present

## 2018-10-30 ENCOUNTER — Telehealth: Payer: Self-pay

## 2018-10-30 NOTE — Telephone Encounter (Signed)
TC to pt per Cira Rue NP to let her know that her mammogram is negative. No mass or abnormality. Also no clear cause for her left breast pain. Also let her knoe that Lacie plans to discuss this with Dr Burr Medico next week and they will let her know if we have further recommendations. Pt verbalized understanding. No further problems or concerns at this time.

## 2018-11-02 ENCOUNTER — Encounter: Payer: Self-pay | Admitting: Internal Medicine

## 2018-11-02 ENCOUNTER — Other Ambulatory Visit: Payer: Self-pay

## 2018-11-02 ENCOUNTER — Ambulatory Visit: Payer: Medicare Other | Attending: Internal Medicine | Admitting: Internal Medicine

## 2018-11-02 VITALS — BP 123/83 | HR 75 | Temp 98.6°F | Resp 18 | Ht 67.0 in | Wt 223.0 lb

## 2018-11-02 DIAGNOSIS — R053 Chronic cough: Secondary | ICD-10-CM

## 2018-11-02 DIAGNOSIS — Z0289 Encounter for other administrative examinations: Secondary | ICD-10-CM

## 2018-11-02 DIAGNOSIS — E119 Type 2 diabetes mellitus without complications: Secondary | ICD-10-CM | POA: Diagnosis not present

## 2018-11-02 DIAGNOSIS — R911 Solitary pulmonary nodule: Secondary | ICD-10-CM

## 2018-11-02 DIAGNOSIS — E669 Obesity, unspecified: Secondary | ICD-10-CM

## 2018-11-02 DIAGNOSIS — I10 Essential (primary) hypertension: Secondary | ICD-10-CM

## 2018-11-02 DIAGNOSIS — R05 Cough: Secondary | ICD-10-CM

## 2018-11-02 DIAGNOSIS — G8929 Other chronic pain: Secondary | ICD-10-CM

## 2018-11-02 DIAGNOSIS — N183 Chronic kidney disease, stage 3 unspecified: Secondary | ICD-10-CM

## 2018-11-02 LAB — POCT GLYCOSYLATED HEMOGLOBIN (HGB A1C): Hemoglobin A1C: 6 % — AB (ref 4.0–5.6)

## 2018-11-02 LAB — GLUCOSE, POCT (MANUAL RESULT ENTRY): POC Glucose: 128 mg/dl — AB (ref 70–99)

## 2018-11-02 MED ORDER — TRAMADOL HCL 50 MG PO TABS: 50.0000 mg | ORAL_TABLET | ORAL | 0 refills | Status: DC | PRN

## 2018-11-02 NOTE — Progress Notes (Signed)
Patient ID: Rachel Herman, female    DOB: [birth year]  MRN: [id number]  CC: Follow-up   Subjective: Rachel Herman is a 68 y.o. female who presents for chronic disease management Her concerns today include:  Pt with hx of HTN, DM, HL, DJD lumbar and spinal stenosiswith hx of laminectomy 2016, obesity, depression,DuctalCA LT s/plumpectomy/XRT (2015 and 2016, followed by Rachel Herman), vaginal prolapse, CKD 3.   Patient last evaluated 07/2018  Since last visit with me she saw Rachel Herman for chronic cough.  He has a history of cough to be due to upper airway irritation likely due to GERD.  He recommended that she continue omeprazole.  Continue albuterol as needed.  This is better.  She uses albuterol inhaler after she comes back from a daily walk.  Seen in the emergency room 10/19/2018 with left breast pain.  CT of abdomen and pelvis revealed incidental finding of 7 mm right middle lobe nodule that is stable in size.  Pain thought to be neuropathic.  Subsequently saw the oncology NP and had mammogram and ultrasound that were negative. Pt reports that pain has subsided  DIABETES TYPE 2 Last A1C:   Results for orders placed or performed in visit on 11/02/18  HgB A1c  Result Value Ref Range   Hemoglobin A1C 6.0 (A) 4.0 - 5.6 %   HbA1c POC (<> result, manual entry)     HbA1c, POC (prediabetic range)     HbA1c, POC (controlled diabetic range)    Glucose (CBG)  Result Value Ref Range   POC Glucose 128 (A) 70 - 99 mg/dl    Med Adherence:  '[x]'  Yes    '[]'  No Medication side effects:  '[]'  Yes    '[x]'  No Home Monitoring?  '[x]'  Yes  QOD Home glucose results range: about 100. 160 highest Diet Adherence: '[x]'  Yes    '[]'  No Exercise: '[x]'  Yes -walks her dog every daily for 3/4 miles Hypoglycemic episodes?: '[]'  Yes    '[x]'  No Numbness of the feet? '[]'  Yes    '[x]'  No Retinopathy hx? '[]'  Yes    '[x]'  No Last eye exam: reports having had eye exam by Rachel Herman about 1 mth ago. No retinopathy  HYPERTENSION  Currently taking: see medication list Med Adherence: '[x]'  Yes    '[]'  No Medication side effects: '[]'  Yes    '[x]'  No Adherence with salt restriction: '[x]'  Yes    '[]'  No Home Monitoring?: '[]'  Yes    '[x]'  No Monitoring Frequency: '[]'  Yes    '[]'  No Home BP results range: '[]'  Yes    '[]'  No SOB? '[]'  Yes    '[x]'  No Chest Pain?: '[]'  Yes    '[x]'  No Leg swelling?: '[]'  Yes    '[x]'  No Headaches?: '[]'  Yes    '[x]'  No Dizziness? '[]'  Yes    '[x]'  No Comments:    Obesity: wants to get to 200lb.  "I have tried everything, eating less and walking."  Declined referral to nutritionist -drinks 1 regular [facility] a day.  Uses sugar in coffee Does not eat much rice.    Back still bothers her.  Goes down RT leg at times especially when she does a lot of walking.   Tramadol helps but out of med.  No adverse S.E  CKD: eGFR has been in the stage 3 range for a while.  Admits that she takes Advil when she runs out of Tramadol  HM:  Had c-scope by Rachel Herman in July.  Had one polyp removed.  Told she needs repeat in 5 years.  Patient Active Problem List   Diagnosis Date Noted  . Chronic cough 08/03/2018  . Other intervertebral disc degeneration, lumbar region 03/18/2018  . 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  . 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 Lumbar pain with radiation down left and right leg 07/02/2016  . Thyroid nodule 06/09/2015  . Nodule of right lung 07/28/2014  . Diverticulosis of colon without hemorrhage 07/28/2014  . DJD (degenerative joint disease), lumbar 02/22/2014  . Lumbar pain with radiation down left leg 01/17/2014  . Breast cancer of upper-inner quadrant of left female breast ([place]) 01/05/2014  . Hyperlipidemia associated with type 2 diabetes mellitus ([place]) 09/28/2013  . S/P total hysterectomy and bilateral salpingo-oophorectomy 09/20/2013  . Morbid obesity with body mass index of 40.0-44.9 in adult ([place]) 09/17/2013  . Essential hypertension   . Diabetes mellitus ([place])  09/16/2013     Current Outpatient Medications on File Prior to Visit  Medication Sig Dispense Refill  . acetaminophen (TYLENOL) 500 MG tablet Take 1,000 mg by mouth every 6 (six) hours as needed for headache (pain).    Marland Kitchen albuterol (VENTOLIN HFA) 108 (90 Base) MCG/ACT inhaler Inhale 2 puffs into the lungs every 6 (six) hours as needed for wheezing or shortness of breath. 8 g 3  . anastrozole (ARIMIDEX) 1 MG tablet Take 1 tablet (1 mg total) by mouth daily. 90 tablet 3  . Ascorbic Acid (VITAMIN C) 1000 MG tablet Take 1,000 mg by mouth daily.    Marland Kitchen aspirin EC 325 MG tablet Take 325 mg by mouth daily.    . Blood Glucose Monitoring Suppl (ONE TOUCH ULTRA MINI) w/Device KIT USE AS DIRECTED ONCE DAILY DX CODE E11.9 1 each 0  . cloNIDine (CATAPRES) 0.1 MG tablet TAKE 1 TABLET (0.1 MG TOTAL) BY MOUTH 2 (TWO) TIMES DAILY. 180 tablet 0  . [facility] SENNA PLUS 8.6-50 MG tablet TAKE 2 TABLETS BY MOUTH AT BEDTIME AS NEEDED FOR MILD CONSTIPATION (Patient taking differently: Take 1 tablet by mouth at bedtime. ) 60 tablet 11  . gabapentin (NEURONTIN) 300 MG capsule TAKE 2 CAPSULES (600 MG TOTAL) BY MOUTH 2 (TWO) TIMES DAILY. 360 capsule 2  . glucose blood (ONE TOUCH ULTRA TEST) test strip USE AS DIRECTED ONCE DAILY DX CODE E11.9 100 each 12  . hydrochlorothiazide (HYDRODIURIL) 25 MG tablet Take 1 tablet (25 mg total) by mouth at bedtime. 30 tablet 6  . metFORMIN (GLUCOPHAGE) 500 MG tablet Take 1 tablet (500 mg total) by mouth 2 (two) times daily. 180 tablet 3  . methocarbamol (ROBAXIN) 500 MG tablet TAKE 1 TABLET (500 MG TOTAL) BY MOUTH 4 (FOUR) TIMES DAILY. (Patient taking differently: Take 500 mg by mouth 2 (two) times daily as needed for muscle spasms. ) 60 tablet 2  . Multiple Vitamins-Minerals (MULTIVITAMIN WITH MINERALS) tablet Take 1 tablet by mouth daily. Reported on 03/13/2015    . omeprazole (PRILOSEC) 20 MG capsule TAKE 1 CAPSULE BY MOUTH EVERY DAY (Patient taking differently: Take 20 mg by mouth daily. ) 90  capsule 0  . ONETOUCH DELICA LANCETS 03O MISC USE AS DIRECTED ONCE DAILY DX CODE E11.9 100 each 12  . potassium chloride (K-DUR) 10 MEQ tablet Take 1 tablet (10 mEq total) by mouth daily. 90 tablet 2  . pravastatin (PRAVACHOL) 20 MG tablet Take 1 tablet (20 mg total) by mouth daily. 90 tablet 3   No current facility-administered medications on file prior  to visit.     No Known Allergies  Social History   Socioeconomic History  . Marital status: Married    Spouse name: Lamiyah Schlotter   . Number of children: 3  . Years of education: 42  . Highest education level: Not on file  Occupational History    Employer: UNEMPLOYED  Social Needs  . Financial resource strain: Not on file  . Food insecurity    Worry: Not on file    Inability: Not on file  . Transportation needs    Medical: Not on file    Non-medical: Not on file  Tobacco Use  . Smoking status: Never Smoker  . Smokeless tobacco: Never Used  Substance and Sexual Activity  . Alcohol use: No  . Drug use: No  . Sexual activity: Not Currently    Birth control/protection: Post-menopausal  Lifestyle  . Physical activity    Days per week: Not on file    Minutes per session: Not on file  . Stress: Not on file  Relationships  . Social Herbalist on phone: Not on file    Gets together: Not on file    Attends religious service: Not on file    Active member of club or organization: Not on file    Attends meetings of clubs or organizations: Not on file    Relationship status: Not on file  . Intimate partner violence    Fear of current or ex partner: Not on file    Emotionally abused: Not on file    Physically abused: Not on file    Forced sexual activity: Not on file  Other Topics Concern  . Not on file  Social History Narrative   Married to [employer] in 1986.    Has 3 children from previous marriage, 1 in [place], 1 in [place], 1 in prison ([place]).    Lives with husband.    Right-handed.   1  cup caffeine per day.    Family History  Problem Relation Age of Onset  . Diabetes Mother   . Multiple myeloma Mother 17  . Hearing loss Mother   . Diabetes Brother   . Cancer Brother 40       prostate cancer   . Diabetes Maternal Aunt   . Leukemia Maternal Aunt        dx in her 35s  . Alcoholism Brother   . Heart attack Father   . Diabetes Sister   . Diabetes Son     Past Surgical History:  Procedure Laterality Date  . ABDOMINAL HYSTERECTOMY N/A 09/20/2013   Procedure: HYSTERECTOMY ABDOMINAL;  Surgeon: Osborne Oman, MD;  Location: [facility];  Service: Gynecology;  Laterality: N/A;  . BREAST LUMPECTOMY Left 2015   radiation  . BREAST LUMPECTOMY WITH AXILLARY LYMPH NODE BIOPSY Left 12/29/13   left breast   . LAPAROSCOPIC ASSISTED VAGINAL HYSTERECTOMY  09/20/2013   Procedure: LAPAROSCOPIC ASSISTED VAGINAL HYSTERECTOMY;  Surgeon: Osborne Oman, MD;  Location: [facility];  Service: Gynecology;;  PT WAS EXAMINED WHILE UP IN [place] AND IT WAS DECIDED TO OPEN PT DUE TO LARGE MASS  . LUMBAR LAMINECTOMY/DECOMPRESSION MICRODISCECTOMY Right 12/14/2014   Procedure: Right Lumbar three-four microdiskectomy;  Surgeon: Newman Pies, MD;  Location: [facility];  Service: Neurosurgery;  Laterality: Right;  Right Lumbar three-four microdiskectomy  . RADIOACTIVE SEED GUIDED PARTIAL MASTECTOMY WITH AXILLARY SENTINEL LYMPH NODE BIOPSY Left 12/29/2013   Procedure: LEFT BREAST RADIOACTIVE SEED LOCALIZED LUMPECTOMY WITH SENTINEL  LYMPH NODE MAPPING;  Surgeon: Erroll Luna, MD;  Location: [facility];  Service: General;  Laterality: Left;  . RE-EXCISION OF BREAST LUMPECTOMY Left 03/02/2014   Procedure: RE-EXCISION OF LEFT BREAST LUMPECTOMY;  Surgeon: Erroll Luna, MD;  Location: [facility];  Service: General;  Laterality: Left;  . SALPINGOOPHORECTOMY  09/20/2013   Procedure: SALPINGO OOPHORECTOMY;  Surgeon: Osborne Oman, MD;  Location: [facility];  Service: Gynecology;;     ROS: Review of Systems Negative except as stated above  PHYSICAL EXAM: BP 123/83 (BP Location: Left Arm, Patient Position: Sitting, Cuff Size: Large)   Pulse 75   Temp 98.6 F (37 C) (Oral)   Resp 18   Ht '5\' 7"'  (1.702 m)   Wt 223 lb (101.2 kg)   SpO2 97%   BMI 34.93 kg/m   Wt Readings from Last 3 Encounters:  11/02/18 223 lb (101.2 kg)  10/22/18 220 lb 1.6 oz (99.8 kg)  10/19/18 215 lb (97.5 kg)    Physical Exam  General appearance - alert, well appearing, obese older Caucasian female and in no distress Mental status - normal mood, behavior, speech, dress, motor activity, and thought processes Mouth - mucous membranes moist, pharynx normal without lesions Neck - supple, no significant adenopathy Chest - clear to auscultation, no wheezes, rales or rhonchi, symmetric air entry Heart - normal rate, regular rhythm, normal S1, S2, no murmurs, rubs, clicks or gallops Extremities - peripheral pulses normal, no pedal edema, no clubbing or cyanosis Diabetic Foot Exam - Simple   Simple Foot Form Visual Inspection Sensation Testing Intact to touch and monofilament testing bilaterally: Yes Pulse Check Posterior Tibialis and Dorsalis pulse intact bilaterally: Yes Comments      CMP Latest Ref Rng & Units 10/19/2018 03/18/2018 03/09/2018  Glucose 70 - 99 mg/dL 127(H) 124(H) 142(H)  BUN 8 - 23 mg/dL '21 18 20  ' Creatinine 0.44 - 1.00 mg/dL 1.07(H) 1.11(H) 1.12(H)  Sodium 135 - 145 mmol/L 139 143 142  Potassium 3.5 - 5.1 mmol/L 3.9 4.0 3.6  Chloride 98 - 111 mmol/L 99 99 104  CO2 22 - 32 mmol/L '28 22 30  ' Calcium 8.9 - 10.3 mg/dL 9.3 10.0 9.4  Total Protein 6.5 - 8.1 g/dL 7.3 - 7.0  Total Bilirubin 0.3 - 1.2 mg/dL 0.5 - 0.4  Alkaline Phos 38 - 126 U/L 52 - 57  AST 15 - 41 U/L 48(H) - 28  ALT 0 - 44 U/L 44 - 29   Lipid Panel     Component Value Date/Time   CHOL 190 01/22/2018 1201   TRIG 286 (H) 01/22/2018 1201   HDL 41 01/22/2018 1201   CHOLHDL 4.6 (H) 01/22/2018 1201    CHOLHDL 5.9 09/27/2013 1053   VLDL 48 (H) 09/27/2013 1053   LDLCALC 92 01/22/2018 1201    CBC    Component Value Date/Time   WBC 8.9 10/19/2018 1120   RBC 4.85 10/19/2018 1120   HGB 13.5 10/19/2018 1120   HGB 14.5 11/08/2016 0915   HCT 43.6 10/19/2018 1120   HCT 44.2 11/08/2016 0915   PLT 306 10/19/2018 1120   PLT 295 11/08/2016 0915   MCV 89.9 10/19/2018 1120   MCV 85.3 11/08/2016 0915   MCH 27.8 10/19/2018 1120   MCHC 31.0 10/19/2018 1120   RDW 14.2 10/19/2018 1120   RDW 15.1 (H) 11/08/2016 0915   LYMPHSABS 2.2 03/09/2018 0820   LYMPHSABS 1.7 11/08/2016 0915   MONOABS 0.6 03/09/2018 0820   MONOABS 0.7  11/08/2016 0915   EOSABS 0.2 03/09/2018 0820   EOSABS 0.1 11/08/2016 0915   BASOSABS 0.0 03/09/2018 0820   BASOSABS 0.0 11/08/2016 0915    ASSESSMENT AND PLAN:  1. Type 2 diabetes mellitus without complication, without long-term current use of insulin (HCC) At goal Continue Metformin and regular exercise Dietary counseling given.  Stop sugary drinks - HgB A1c - Glucose (CBG) - Microalbumin / creatinine urine ratio  2. Essential hypertension Close to goal.  Continue current meds and DASH  3. Obesity (BMI 30.0-34.9) See #1 above  4. Chronic radicular pain of lower back Pt doing well on Tramadol.  No significant S.E.  No aberrant behaviour Will update control substance prescribing agreement NCCSRS reviewed - traMADol (ULTRAM) 50 MG tablet; Take 1 tablet (50 mg total) by mouth every 4 (four) hours as needed.  Dispense: 120 tablet; Refill: 0 - traMADol (ULTRAM) 50 MG tablet; Take 1 tablet (50 mg total) by mouth every 4 (four) hours as needed. Fill on or after 12/02/2018  Dispense: 120 tablet; Refill: 0  5. Nodule of right lung Message sent to her oncologist to make sure that she is aware  6. Chronic cough Better on omeprazole  7. CKD (chronic kidney disease), stage III (HCC) Stable. Advised to stop NSAIDs Stressed importance of good blood pressure and  diabetes control  8. Pain management contract agreement - [id number] 11+Oxyco+Alc+Crt-Bund   Patient was given the opportunity to ask questions.  Patient verbalized understanding of the plan and was able to repeat key elements of the plan.   Orders Placed This Encounter  Procedures  . Microalbumin / creatinine urine ratio  . [id number] 11+Oxyco+Alc+Crt-Bund  . HgB A1c  . Glucose (CBG)     Requested Prescriptions   Signed Prescriptions Disp Refills  . traMADol (ULTRAM) 50 MG tablet 120 tablet 0    Sig: Take 1 tablet (50 mg total) by mouth every 4 (four) hours as needed.  . traMADol (ULTRAM) 50 MG tablet 120 tablet 0    Sig: Take 1 tablet (50 mg total) by mouth every 4 (four) hours as needed. Fill on or after 12/02/2018    No follow-ups on file.  Karle Plumber, MD, FACP

## 2018-11-03 ENCOUNTER — Ambulatory Visit: Payer: Medicare Other | Admitting: Internal Medicine

## 2018-11-03 ENCOUNTER — Telehealth: Payer: Self-pay | Admitting: Nurse Practitioner

## 2018-11-03 LAB — MICROALBUMIN / CREATININE URINE RATIO
Creatinine, Urine: 126.6 mg/dL
Microalb/Creat Ratio: 3 mg/g creat (ref 0–29)
Microalbumin, Urine: 3.4 ug/mL

## 2018-11-03 NOTE — Telephone Encounter (Signed)
I called patient to f/u on her breast pain and to inform her of negative mammogram. Her pain is much improved on increased dose of gabapentin, 3 capsules BID. She rates pain 5/10 which is "not bad." I discussed adding cymbalta for additional pain management and referral back to PT. She declined for now. She will monitor and call us if pain worsens. Next routine f/u in 03/2019, she is aware. Agrees to call sooner if she has any needs. She appreciates the call. Cira Rue, NP  11/03/2018

## 2018-11-05 LAB — DRUG SCREEN 764883 11+OXYCO+ALC+CRT-BUND
Amphetamines, Urine: NEGATIVE ng/mL
BENZODIAZ UR QL: NEGATIVE ng/mL
Barbiturate: NEGATIVE ng/mL
Cannabinoid Quant, Ur: NEGATIVE ng/mL
Cocaine (Metabolite): NEGATIVE ng/mL
Creatinine: 129.8 mg/dL (ref 20.0–300.0)
Ethanol: NEGATIVE %
Meperidine: NEGATIVE ng/mL
Methadone Screen, Urine: NEGATIVE ng/mL
OPIATE SCREEN URINE: NEGATIVE ng/mL
Oxycodone/Oxymorphone, Urine: NEGATIVE ng/mL
Phencyclidine: NEGATIVE ng/mL
Propoxyphene: NEGATIVE ng/mL
pH, Urine: 5.1 (ref 4.5–8.9)

## 2018-11-05 LAB — TRAMADOL GC/MS, URINE
Tramadol gc/ms Conf: 12665 ng/mL
Tramadol: POSITIVE — AB

## 2018-11-09 ENCOUNTER — Other Ambulatory Visit: Payer: Self-pay | Admitting: Internal Medicine

## 2018-11-09 DIAGNOSIS — K219 Gastro-esophageal reflux disease without esophagitis: Secondary | ICD-10-CM

## 2018-11-25 ENCOUNTER — Encounter: Payer: Self-pay | Admitting: Internal Medicine

## 2018-11-25 NOTE — Progress Notes (Signed)
Received c-scope report from Dr. Benson Norway, [facility].  Done 09/15/2018.  One 2 mm hyperplastic polyp removed.  Recommended repeat c-scope in yrs due to fhx of colon CA in pt's brother at age 69.

## 2018-11-26 ENCOUNTER — Other Ambulatory Visit: Payer: Self-pay | Admitting: Internal Medicine

## 2018-11-26 DIAGNOSIS — I152 Hypertension secondary to endocrine disorders: Secondary | ICD-10-CM

## 2018-12-01 ENCOUNTER — Telehealth: Payer: Self-pay | Admitting: Internal Medicine

## 2018-12-01 DIAGNOSIS — I152 Hypertension secondary to endocrine disorders: Secondary | ICD-10-CM

## 2018-12-01 NOTE — Telephone Encounter (Signed)
1) Medication(s) Requested (by name): Tramadol Clonidine (states they havn't recieved)  2) Pharmacy of Choice: [id number] - [place], [place]

## 2018-12-02 MED ORDER — CLONIDINE HCL 0.1 MG PO TABS: 0.1000 mg | ORAL_TABLET | Freq: Two times a day (BID) | ORAL | 1 refills | Status: DC

## 2018-12-02 NOTE — Telephone Encounter (Signed)
The pharmacy has the tramadol rx the patient may fill today. She needs to contact them and request it to be filled.

## 2018-12-02 NOTE — Telephone Encounter (Signed)
Luke could you look into this please  

## 2018-12-02 NOTE — Telephone Encounter (Signed)
Patient called to check on the status of her refill request for:  Tramadol Clonidine   Informed of 24-48 hour turn around time. Please follow up.

## 2019-01-09 ENCOUNTER — Other Ambulatory Visit: Payer: Self-pay | Admitting: Internal Medicine

## 2019-01-09 DIAGNOSIS — G8929 Other chronic pain: Secondary | ICD-10-CM

## 2019-02-05 ENCOUNTER — Other Ambulatory Visit: Payer: Self-pay | Admitting: Internal Medicine

## 2019-02-05 DIAGNOSIS — E119 Type 2 diabetes mellitus without complications: Secondary | ICD-10-CM

## 2019-02-05 DIAGNOSIS — K219 Gastro-esophageal reflux disease without esophagitis: Secondary | ICD-10-CM

## 2019-02-08 ENCOUNTER — Encounter: Payer: Self-pay | Admitting: Internal Medicine

## 2019-02-08 ENCOUNTER — Other Ambulatory Visit: Payer: Self-pay

## 2019-02-08 ENCOUNTER — Ambulatory Visit: Payer: Medicare Other | Attending: Internal Medicine | Admitting: Internal Medicine

## 2019-02-08 VITALS — BP 142/86 | HR 74 | Resp 16 | Wt 230.2 lb

## 2019-02-08 DIAGNOSIS — E1169 Type 2 diabetes mellitus with other specified complication: Secondary | ICD-10-CM | POA: Diagnosis not present

## 2019-02-08 DIAGNOSIS — G8929 Other chronic pain: Secondary | ICD-10-CM

## 2019-02-08 DIAGNOSIS — R079 Chest pain, unspecified: Secondary | ICD-10-CM | POA: Insufficient documentation

## 2019-02-08 DIAGNOSIS — N183 Chronic kidney disease, stage 3 unspecified: Secondary | ICD-10-CM | POA: Insufficient documentation

## 2019-02-08 DIAGNOSIS — Z923 Personal history of irradiation: Secondary | ICD-10-CM | POA: Insufficient documentation

## 2019-02-08 DIAGNOSIS — E785 Hyperlipidemia, unspecified: Secondary | ICD-10-CM

## 2019-02-08 DIAGNOSIS — Z79899 Other long term (current) drug therapy: Secondary | ICD-10-CM | POA: Insufficient documentation

## 2019-02-08 DIAGNOSIS — I129 Hypertensive chronic kidney disease with stage 1 through stage 4 chronic kidney disease, or unspecified chronic kidney disease: Secondary | ICD-10-CM | POA: Insufficient documentation

## 2019-02-08 DIAGNOSIS — R0789 Other chest pain: Secondary | ICD-10-CM

## 2019-02-08 DIAGNOSIS — E1122 Type 2 diabetes mellitus with diabetic chronic kidney disease: Secondary | ICD-10-CM | POA: Insufficient documentation

## 2019-02-08 DIAGNOSIS — Z8249 Family history of ischemic heart disease and other diseases of the circulatory system: Secondary | ICD-10-CM | POA: Insufficient documentation

## 2019-02-08 DIAGNOSIS — I10 Essential (primary) hypertension: Secondary | ICD-10-CM | POA: Diagnosis not present

## 2019-02-08 DIAGNOSIS — E119 Type 2 diabetes mellitus without complications: Secondary | ICD-10-CM

## 2019-02-08 DIAGNOSIS — Z79811 Long term (current) use of aromatase inhibitors: Secondary | ICD-10-CM | POA: Insufficient documentation

## 2019-02-08 DIAGNOSIS — E669 Obesity, unspecified: Secondary | ICD-10-CM

## 2019-02-08 DIAGNOSIS — Z853 Personal history of malignant neoplasm of breast: Secondary | ICD-10-CM | POA: Insufficient documentation

## 2019-02-08 DIAGNOSIS — G4489 Other headache syndrome: Secondary | ICD-10-CM

## 2019-02-08 DIAGNOSIS — M545 Low back pain: Secondary | ICD-10-CM | POA: Insufficient documentation

## 2019-02-08 DIAGNOSIS — M5416 Radiculopathy, lumbar region: Secondary | ICD-10-CM

## 2019-02-08 DIAGNOSIS — Z6836 Body mass index (BMI) 36.0-36.9, adult: Secondary | ICD-10-CM | POA: Insufficient documentation

## 2019-02-08 DIAGNOSIS — Z7982 Long term (current) use of aspirin: Secondary | ICD-10-CM | POA: Insufficient documentation

## 2019-02-08 DIAGNOSIS — Z833 Family history of diabetes mellitus: Secondary | ICD-10-CM | POA: Insufficient documentation

## 2019-02-08 DIAGNOSIS — Z7984 Long term (current) use of oral hypoglycemic drugs: Secondary | ICD-10-CM | POA: Insufficient documentation

## 2019-02-08 LAB — GLUCOSE, POCT (MANUAL RESULT ENTRY): POC Glucose: 75 mg/dl (ref 70–99)

## 2019-02-08 MED ORDER — METFORMIN HCL 500 MG PO TABS: 750.0000 mg | ORAL_TABLET | Freq: Two times a day (BID) | ORAL | 3 refills | Status: DC

## 2019-02-08 MED ORDER — CARVEDILOL 3.125 MG PO TABS: 3.1250 mg | ORAL_TABLET | Freq: Two times a day (BID) | ORAL | 3 refills | Status: DC

## 2019-02-08 MED ORDER — TRAMADOL HCL 50 MG PO TABS: 50.0000 mg | ORAL_TABLET | ORAL | 0 refills | Status: DC | PRN

## 2019-02-08 NOTE — Progress Notes (Signed)
Patient ID: Rachel Herman, female    DOB: 1950-07-30  MRN: [id number]  CC: Diabetes and Hypertension   Subjective: Rachel Herman is a 69 y.o. female who presents for chronic ds management Her concerns today include:  Pt with hx of HTN, DM, HL, DJD lumbar and spinal stenosiswith hx of laminectomy 2016, obesity, depression,DuctalCA LT s/plumpectomy/XRT (2015 and 2016, followed by Dr. Annamaria Herman), vaginal prolapse, CKD 3.  DM: Patient tells me that her blood sugar has been running high since the middle of November.  She checks blood sugars 3 times a day before meals and at bedtime.  Blood sugars before meals are 130-150 and at bedtime around 180.  She tells me that her blood sugars used to run 80-100.  She is not sure why the blood sugars are running higher than what is usual for her.  Reports no major changes in her eating habits.  She quit drinking sodas completely.  She walks 3 times a day with her dog for 20 to 30 minutes.  She has gained 7 pounds since last visit.  HTN:  checking BP once a wk at [employer].  Reports BP has been elev. Limits salt tin foods.  No SOB.  Some cough productive of white to green phlegm Some intermittent CP in center of chest and under LT breast since 12/2018. Occurs almost daily and can last 2 mins.  Occurs more at rest.  Never occurs when she is out walking her dog.  No radiation.  -endorses HA. "whenever my sugar goes 120 up and the higher it gets the worse my HA gets until my BS comes down to about 110."  Takes Tylenol and goes to sleep but when she wakes the HA is still there. No associated blurred vision, photophobia.  Some nausea. No previous HA like this Compliant with Clonidine and HCTZ and took them already today. -both parents had heart ds.  Fathter died MI age 29  HL:  On PRavacol.  Reports compliance with the medicine  Chronic LBP:  Taking Tramadol about Q 4 hrs as needed for back pain.  She reports good relief when she takes the medication  and it allows her to be functional.  She is almost due for a refill.  Patient Active Problem List   Diagnosis Date Noted  . Chronic cough 08/03/2018  . Other intervertebral disc degeneration, lumbar region 03/18/2018  . 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  . 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 Lumbar pain with radiation down left and right leg 07/02/2016  . Thyroid nodule 06/09/2015  . Nodule of right lung 07/28/2014  . Diverticulosis of colon without hemorrhage 07/28/2014  . DJD (degenerative joint disease), lumbar 02/22/2014  . Lumbar pain with radiation down left leg 01/17/2014  . Breast cancer of upper-inner quadrant of left female breast ([place]) 01/05/2014  . Hyperlipidemia associated with type 2 diabetes mellitus ([place]) 09/28/2013  . S/P total hysterectomy and bilateral salpingo-oophorectomy 09/20/2013  . Essential hypertension   . Diabetes mellitus ([place]) 09/16/2013     Current Outpatient Medications on File Prior to Visit  Medication Sig Dispense Refill  . tizanidine (ZANAFLEX) 2 MG capsule Take 2 mg by mouth every 6 (six) hours as needed for muscle spasms. Take 2 tablets every 6 hours prn    . acetaminophen (TYLENOL) 500 MG tablet Take 1,000 mg by mouth every 6 (six) hours as needed for headache (pain).    Marland Kitchen albuterol (VENTOLIN HFA) 108 (90 Base) MCG/ACT  inhaler Inhale 2 puffs into the lungs every 6 (six) hours as needed for wheezing or shortness of breath. 8 g 3  . anastrozole (ARIMIDEX) 1 MG tablet Take 1 tablet (1 mg total) by mouth daily. 90 tablet 3  . Ascorbic Acid (VITAMIN C) 1000 MG tablet Take 1,000 mg by mouth daily.    Marland Kitchen aspirin EC 325 MG tablet Take 325 mg by mouth daily.    . Blood Glucose Monitoring Suppl (ONE TOUCH ULTRA MINI) w/Device KIT USE AS DIRECTED ONCE DAILY DX CODE E11.9 1 each 0  . cloNIDine (CATAPRES) 0.1 MG tablet Take 1 tablet (0.1 mg total) by mouth 2 (two) times daily. 180 tablet 1  . [facility] SENNA PLUS 8.6-50 MG tablet  TAKE 2 TABLETS BY MOUTH AT BEDTIME AS NEEDED FOR MILD CONSTIPATION (Patient taking differently: Take 1 tablet by mouth at bedtime. ) 60 tablet 11  . gabapentin (NEURONTIN) 300 MG capsule TAKE 2 CAPSULES (600 MG TOTAL) BY MOUTH 2 (TWO) TIMES DAILY. 360 capsule 2  . glucose blood (ONE TOUCH ULTRA TEST) test strip USE AS DIRECTED ONCE DAILY DX CODE E11.9 100 each 12  . hydrochlorothiazide (HYDRODIURIL) 25 MG tablet Take 1 tablet (25 mg total) by mouth at bedtime. 30 tablet 6  . methocarbamol (ROBAXIN) 500 MG tablet TAKE 1 TABLET (500 MG TOTAL) BY MOUTH 4 (FOUR) TIMES DAILY. (Patient taking differently: Take 500 mg by mouth 2 (two) times daily as needed for muscle spasms. ) 60 tablet 2  . Multiple Vitamins-Minerals (MULTIVITAMIN WITH MINERALS) tablet Take 1 tablet by mouth daily. Reported on 03/13/2015    . omeprazole (PRILOSEC) 20 MG capsule TAKE 1 CAPSULE BY MOUTH EVERY DAY 90 capsule 0  . ONETOUCH DELICA LANCETS 02O MISC USE AS DIRECTED ONCE DAILY DX CODE E11.9 100 each 12  . potassium chloride (K-DUR) 10 MEQ tablet Take 1 tablet (10 mEq total) by mouth daily. 90 tablet 2  . pravastatin (PRAVACHOL) 20 MG tablet Take 1 tablet (20 mg total) by mouth daily. 90 tablet 3  . traMADol (ULTRAM) 50 MG tablet Take 1 tablet (50 mg total) by mouth every 4 (four) hours as needed. DX:  M51.36 120 tablet 0   No current facility-administered medications on file prior to visit.    No Known Allergies  Social History   Socioeconomic History  . Marital status: Married    Spouse name: Rachel Herman   . Number of children: 3  . Years of education: 18  . Highest education level: Not on file  Occupational History    Employer: UNEMPLOYED  Tobacco Use  . Smoking status: Never Smoker  . Smokeless tobacco: Never Used  Substance and Sexual Activity  . Alcohol use: No  . Drug use: No  . Sexual activity: Not Currently    Birth control/protection: Post-menopausal  Other Topics Concern  . Not on file  Social  History Narrative   Married to [employer] in 1986.    Has 3 children from previous marriage, 1 in [place], 1 in [place], 1 in prison ([place]).    Lives with husband.    Right-handed.   1 cup caffeine per day.   Social Determinants of Health   Financial Resource Strain:   . Difficulty of Paying Living Expenses: Not on file  Food Insecurity:   . Worried About Charity fundraiser in the Last Year: Not on file  . Ran Out of Food in the Last Year: Not on file  Transportation Needs:   .  Lack of Transportation (Medical): Not on file  . Lack of Transportation (Non-Medical): Not on file  Physical Activity:   . Days of Exercise per Week: Not on file  . Minutes of Exercise per Session: Not on file  Stress:   . Feeling of Stress : Not on file  Social Connections:   . Frequency of Communication with Friends and Family: Not on file  . Frequency of Social Gatherings with Friends and Family: Not on file  . Attends Religious Services: Not on file  . Active Member of Clubs or Organizations: Not on file  . Attends Archivist Meetings: Not on file  . Marital Status: Not on file  Intimate Partner Violence:   . Fear of Current or Ex-Partner: Not on file  . Emotionally Abused: Not on file  . Physically Abused: Not on file  . Sexually Abused: Not on file    Family History  Problem Relation Age of Onset  . Diabetes Mother   . Multiple myeloma Mother 24  . Hearing loss Mother   . Diabetes Brother   . Cancer Brother 40       prostate cancer   . Diabetes Maternal Aunt   . Leukemia Maternal Aunt        dx in her 91s  . Alcoholism Brother   . Heart attack Father   . Diabetes Sister   . Diabetes Son     Past Surgical History:  Procedure Laterality Date  . ABDOMINAL HYSTERECTOMY N/A 09/20/2013   Procedure: HYSTERECTOMY ABDOMINAL;  Surgeon: Osborne Oman, MD;  Location: [facility];  Service: Gynecology;  Laterality: N/A;  . BREAST LUMPECTOMY Left 2015   radiation  .  BREAST LUMPECTOMY WITH AXILLARY LYMPH NODE BIOPSY Left 12/29/13   left breast   . LAPAROSCOPIC ASSISTED VAGINAL HYSTERECTOMY  09/20/2013   Procedure: LAPAROSCOPIC ASSISTED VAGINAL HYSTERECTOMY;  Surgeon: Osborne Oman, MD;  Location: [facility];  Service: Gynecology;;  PT WAS EXAMINED WHILE UP IN [place] AND IT WAS DECIDED TO OPEN PT DUE TO LARGE MASS  . LUMBAR LAMINECTOMY/DECOMPRESSION MICRODISCECTOMY Right 12/14/2014   Procedure: Right Lumbar three-four microdiskectomy;  Surgeon: Newman Pies, MD;  Location: [facility];  Service: Neurosurgery;  Laterality: Right;  Right Lumbar three-four microdiskectomy  . RADIOACTIVE SEED GUIDED PARTIAL MASTECTOMY WITH AXILLARY SENTINEL LYMPH NODE BIOPSY Left 12/29/2013   Procedure: LEFT BREAST RADIOACTIVE SEED LOCALIZED LUMPECTOMY WITH SENTINEL LYMPH NODE MAPPING;  Surgeon: Erroll Luna, MD;  Location: [facility];  Service: General;  Laterality: Left;  . RE-EXCISION OF BREAST LUMPECTOMY Left 03/02/2014   Procedure: RE-EXCISION OF LEFT BREAST LUMPECTOMY;  Surgeon: Erroll Luna, MD;  Location: [facility];  Service: General;  Laterality: Left;  . SALPINGOOPHORECTOMY  09/20/2013   Procedure: SALPINGO OOPHORECTOMY;  Surgeon: Osborne Oman, MD;  Location: [facility];  Service: Gynecology;;    ROS: Review of Systems Negative except as stated above  PHYSICAL EXAM: BP (!) 142/86   Pulse 74   Resp 16   Wt 230 lb 3.2 oz (104.4 kg)   SpO2 96%   BMI 36.05 kg/m   Wt Readings from Last 3 Encounters:  02/08/19 230 lb 3.2 oz (104.4 kg)  11/02/18 223 lb (101.2 kg)  10/22/18 220 lb 1.6 oz (99.8 kg)   BP 150/90 Physical Exam General appearance - alert, well appearing, elderly Caucasian female and in no distress Mental status - normal mood, behavior, speech, dress, motor activity, and thought processes Mouth - mucous membranes  moist, pharynx normal without lesions Neck - supple, no significant adenopathy Chest - clear to  auscultation, no wheezes, rales or rhonchi, symmetric air entry Heart - normal rate, regular rhythm, normal S1, S2, no murmurs, rubs, clicks or gallops.  No reproducible tenderness on palpation of the chest wall Extremities -trace bilateral lower extremity edema Neurologic exam: Cranial nerves grossly intact.  Power 5/5 in both upper and lower extremities.  Gait is stable. CMP Latest Ref Rng & Units 10/19/2018 03/18/2018 03/09/2018  Glucose 70 - 99 mg/dL 127(H) 124(H) 142(H)  BUN 8 - 23 mg/dL _0 Creatinine 0.44 - 1.00 mg/dL 1.07(H) 1.11(H) 1.12(H)  Sodium 135 - 145 mmol/L 139 143 142  Potassium 3.5 - 5.1 mmol/L 3.9 4.0 3.6  Chloride 98 - 111 mmol/L 99 99 104  CO2 22 - 32 mmol/L _1 Calcium 8.9 - 10.3 mg/dL 9.3 10.0 9.4  Total Protein 6.5 - 8.1 g/dL 7.3 - 7.0  Total Bilirubin 0.3 - 1.2 mg/dL 0.5 - 0.4  Alkaline Phos 38 - 126 U/L 52 - 57  AST 15 - 41 U/L 48(H) - 28  ALT 0 - 44 U/L 44 - 29   Lipid Panel     Component Value Date/Time   CHOL 190 01/22/2018 1201   TRIG 286 (H) 01/22/2018 1201   HDL 41 01/22/2018 1201   CHOLHDL 4.6 (H) 01/22/2018 1201   CHOLHDL 5.9 09/27/2013 1053   VLDL 48 (H) 09/27/2013 1053   LDLCALC 92 01/22/2018 1201    CBC    Component Value Date/Time   WBC 8.9 10/19/2018 1120   RBC 4.85 10/19/2018 1120   HGB 13.5 10/19/2018 1120   HGB 14.5 11/08/2016 0915   HCT 43.6 10/19/2018 1120   HCT 44.2 11/08/2016 0915   PLT 306 10/19/2018 1120   PLT 295 11/08/2016 0915   MCV 89.9 10/19/2018 1120   MCV 85.3 11/08/2016 0915   MCH 27.8 10/19/2018 1120   MCHC 31.0 10/19/2018 1120   RDW 14.2 10/19/2018 1120   RDW 15.1 (H) 11/08/2016 0915   LYMPHSABS 2.2 03/09/2018 0820   LYMPHSABS 1.7 11/08/2016 0915   MONOABS 0.6 03/09/2018 0820   MONOABS 0.7 11/08/2016 0915   EOSABS 0.2 03/09/2018 0820   EOSABS 0.1 11/08/2016 0915   BASOSABS 0.0 03/09/2018 0820   BASOSABS 0.0 11/08/2016 0915   EKG: Normal sinus rhythm.  Normal EKG. ASSESSMENT AND PLAN:  1. Type  2 diabetes mellitus without complication, without long-term current use of insulin ([place]) Advised patient that blood sugars before meals are just slightly above goal of 90-130.  Blood sugar at bedtime is at goal of 180 or lower.  Patient still concerned that it is higher than what she normally gets.  She is on Metformin 500 mg twice a day.  We increased it slightly to 750 mg twice a day.  Continue to encourage healthy eating habits and regular exercise -Blood sugar was low today in the office.  Patient given some crackers to eat and advised to eat it before getting behind the wheel.  Continue to monitor blood sugars at home - Glucose (CBG) - metFORMIN (GLUCOPHAGE) 500 MG tablet; Take 1.5 tablets (750 mg total) by mouth 2 (two) times daily.  Dispense: 270 tablet; Refill: 3  2. Essential hypertension Not at goal.  Continue HCTZ and clonidine.  Start carvedilol. - carvedilol (COREG) 3.125 MG tablet; Take 1 tablet (3.125 mg total) by mouth 2 (two) times daily with a meal.  Dispense: 60  tablet; Refill: 3  3. Headache syndrome May be due to uncontrolled blood pressure.  We will bring her back in about 3 weeks to see whether the headache resolves with better control of blood pressure.  If it does not we will need to do CT or MRI of the head  4. Chest pain in adult Chest pain sounds atypical but she does have risk factors for heart disease.  I will refer her to cardiology.  EKG revealed no ischemic changes - EKG 12-Lead - Ambulatory referral to Cardiology  5. Hyperlipidemia associated with type 2 diabetes mellitus (HCC) Continue Pravachol  6. Obesity (BMI 35.0-39.9 without comorbidity) See #1 above  7. Chronic radicular pain of lower back Patient doing well on tramadol for chronic pain.  She is functional.  Her controlled substance prescribing agreement is up-to-date.  [place] controlled substance reporting system reviewed. - traMADol (ULTRAM) 50 MG tablet; Take 1 tablet (50 mg total) by  mouth every 4 (four) hours as needed. Fill on or after 02/12/2019  Dispense: 120 tablet; Refill: 0    Patient was given the opportunity to ask questions.  Patient verbalized understanding of the plan and was able to repeat key elements of the plan.   Orders Placed This Encounter  Procedures  . Ambulatory referral to Cardiology  . Glucose (CBG)  . EKG 12-Lead     Requested Prescriptions   Signed Prescriptions Disp Refills  . carvedilol (COREG) 3.125 MG tablet 60 tablet 3    Sig: Take 1 tablet (3.125 mg total) by mouth 2 (two) times daily with a meal.  . traMADol (ULTRAM) 50 MG tablet 120 tablet 0    Sig: Take 1 tablet (50 mg total) by mouth every 4 (four) hours as needed. Fill on or after 02/12/2019  . metFORMIN (GLUCOPHAGE) 500 MG tablet 270 tablet 3    Sig: Take 1.5 tablets (750 mg total) by mouth 2 (two) times daily.    Return in about 3 weeks (around 03/01/2019).  Karle Plumber, MD, FACP

## 2019-02-08 NOTE — Progress Notes (Signed)
Pt states her back pain radiates down to her b/l legs

## 2019-02-08 NOTE — Patient Instructions (Signed)
Your blood pressure is not controlled.  We have added a blood pressure medicine called carvedilol 3.125 mg to take twice a day.

## 2019-02-23 ENCOUNTER — Ambulatory Visit: Payer: Medicare Other | Admitting: Cardiology

## 2019-03-04 ENCOUNTER — Ambulatory Visit: Payer: Medicare Other | Attending: Internal Medicine | Admitting: Internal Medicine

## 2019-03-04 ENCOUNTER — Other Ambulatory Visit: Payer: Self-pay

## 2019-03-04 DIAGNOSIS — Z538 Procedure and treatment not carried out for other reasons: Secondary | ICD-10-CM

## 2019-03-04 NOTE — Progress Notes (Signed)
Phone call placed to patient to do her telephone visit.  Female had answered the phone stating that patient took her granddaughter to her doctor's appointment.  Patient had requested that I try to call her back after 2:00 to do the visit.  However we were not able to call back at 2.  I will have my CMA reschedule her appointment

## 2019-03-04 NOTE — Progress Notes (Signed)
Pt states her chest pain is doing better  Pt states she still gets headaches when her blood sugar gets higher than 130  Pt states her blood sugar this morning was 136 and she has a slight headache

## 2019-03-05 ENCOUNTER — Other Ambulatory Visit: Payer: Self-pay | Admitting: Critical Care Medicine

## 2019-03-05 ENCOUNTER — Other Ambulatory Visit: Payer: Self-pay | Admitting: Internal Medicine

## 2019-03-05 DIAGNOSIS — E876 Hypokalemia: Secondary | ICD-10-CM

## 2019-03-05 DIAGNOSIS — I10 Essential (primary) hypertension: Secondary | ICD-10-CM

## 2019-03-08 ENCOUNTER — Other Ambulatory Visit: Payer: Self-pay

## 2019-03-08 ENCOUNTER — Ambulatory Visit
Admission: RE | Admit: 2019-03-08 | Discharge: 2019-03-08 | Disposition: A | Discharge status: Home / Self Care | Payer: Medicare Other | Source: Ambulatory Visit | Attending: Hematology | Admitting: Hematology

## 2019-03-08 DIAGNOSIS — C50212 Malignant neoplasm of upper-inner quadrant of left female breast: Secondary | ICD-10-CM

## 2019-03-08 DIAGNOSIS — Z170 Estrogen receptor positive status [ER+]: Secondary | ICD-10-CM

## 2019-03-08 DIAGNOSIS — R928 Other abnormal and inconclusive findings on diagnostic imaging of breast: Secondary | ICD-10-CM | POA: Diagnosis not present

## 2019-03-10 ENCOUNTER — Inpatient Hospital Stay: Payer: Medicare Other | Attending: Hematology

## 2019-03-10 ENCOUNTER — Inpatient Hospital Stay: Payer: Medicare Other | Admitting: Hematology

## 2019-03-10 DIAGNOSIS — M79604 Pain in right leg: Secondary | ICD-10-CM | POA: Insufficient documentation

## 2019-03-10 DIAGNOSIS — Z79811 Long term (current) use of aromatase inhibitors: Secondary | ICD-10-CM | POA: Insufficient documentation

## 2019-03-10 DIAGNOSIS — M2550 Pain in unspecified joint: Secondary | ICD-10-CM | POA: Insufficient documentation

## 2019-03-10 DIAGNOSIS — Z79899 Other long term (current) drug therapy: Secondary | ICD-10-CM | POA: Insufficient documentation

## 2019-03-10 DIAGNOSIS — E119 Type 2 diabetes mellitus without complications: Secondary | ICD-10-CM | POA: Insufficient documentation

## 2019-03-10 DIAGNOSIS — R232 Flushing: Secondary | ICD-10-CM | POA: Insufficient documentation

## 2019-03-10 DIAGNOSIS — M545 Low back pain: Secondary | ICD-10-CM | POA: Insufficient documentation

## 2019-03-10 DIAGNOSIS — E785 Hyperlipidemia, unspecified: Secondary | ICD-10-CM | POA: Insufficient documentation

## 2019-03-10 DIAGNOSIS — M1990 Unspecified osteoarthritis, unspecified site: Secondary | ICD-10-CM | POA: Insufficient documentation

## 2019-03-10 DIAGNOSIS — Z7984 Long term (current) use of oral hypoglycemic drugs: Secondary | ICD-10-CM | POA: Insufficient documentation

## 2019-03-10 DIAGNOSIS — M549 Dorsalgia, unspecified: Secondary | ICD-10-CM | POA: Insufficient documentation

## 2019-03-10 DIAGNOSIS — M79605 Pain in left leg: Secondary | ICD-10-CM | POA: Insufficient documentation

## 2019-03-10 DIAGNOSIS — R079 Chest pain, unspecified: Secondary | ICD-10-CM | POA: Insufficient documentation

## 2019-03-10 DIAGNOSIS — R911 Solitary pulmonary nodule: Secondary | ICD-10-CM | POA: Insufficient documentation

## 2019-03-10 DIAGNOSIS — E041 Nontoxic single thyroid nodule: Secondary | ICD-10-CM | POA: Insufficient documentation

## 2019-03-10 DIAGNOSIS — G8929 Other chronic pain: Secondary | ICD-10-CM | POA: Insufficient documentation

## 2019-03-10 DIAGNOSIS — Z170 Estrogen receptor positive status [ER+]: Secondary | ICD-10-CM | POA: Insufficient documentation

## 2019-03-10 DIAGNOSIS — Z90721 Acquired absence of ovaries, unilateral: Secondary | ICD-10-CM | POA: Insufficient documentation

## 2019-03-10 DIAGNOSIS — I10 Essential (primary) hypertension: Secondary | ICD-10-CM | POA: Insufficient documentation

## 2019-03-10 DIAGNOSIS — C50212 Malignant neoplasm of upper-inner quadrant of left female breast: Secondary | ICD-10-CM | POA: Insufficient documentation

## 2019-03-12 ENCOUNTER — Telehealth: Payer: Self-pay | Admitting: Hematology

## 2019-03-12 NOTE — Progress Notes (Signed)
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 Ultrasound of the left axilla is negative.      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    12/29/2013 Surgery   Left lumpectomy with close (<0.1cm) inferior margin   03/02/2014 Surgery   Reexcision for close margin, path negative for malignancy.   04/05/2014 - 05/20/2014 Radiation Therapy   adjuvant breast radiation    05/27/2014 Imaging   Bone density scan: normal    06/07/2014 -  Anti-estrogen oral therapy   Exemestane 25 mg daily, she stopped in Nov 2018 due to high copay. Change anastrozole on 05/09/2017    06/20/2015 Imaging   Bone scan 06/20/2015 IMPRESSION: No evidence of metastatic disease. Mild increased activity dorsal aspect right midfoot probable degenerative in nature. Clinical correlation is necessary.   02/29/2016 Mammogram   MM DIAG BREAST TOMO BIALTERAL 02/29/16 IMPRESSION: Stable left breast lumpectomy site. No mammographic  evidence of malignancy in the bilateral breasts.   03/03/2017 Mammogram   IMPRESSION: 1. No mammographic evidence of malignancy in either breast. 2. Stable left breast posttreatment changes.      CURRENT THERAPY:  Exemestane 67m daily, started on 06/07/2014. She stopped in Nov 2018 due to high copay. Changed toanastrozole on 05/09/2017. Plan to complete at end of 2021.  INTERVAL HISTORY:  Rachel SCHEWEis here for a follow up of left breast cancer. She was last seen by me 6 months ago and seen by NP Rachel Herman in interim. She presents to the clinic alone. She notes she is doing well. She denies any new changes. She has been staying home. She notes she is waiting for her names to be called to receive the COVID19 vaccine. She is on Anastrozole and still having back pain with new b/l leg pain for the past 3-4 months. She notes her pain exacerbated with walking or laying on her sides. She notes she talked about this with her PCP and will f/u with them in 05/2019. She has had hip injections before which helped. She notes her A1c from yesterday with her PCP was 5.8. She notes her BP was elevated recently and was placed on another HTN medication and has improved since.     REVIEW OF SYSTEMS:   Constitutional: Denies fevers, chills or abnormal weight loss Eyes: Denies blurriness of vision Ears, nose, mouth, throat, and face: Denies mucositis or sore throat Respiratory: Denies cough, dyspnea or wheezes Cardiovascular: Denies palpitation, chest discomfort or lower extremity swelling Gastrointestinal:  Denies nausea, heartburn or change in bowel  habits Skin: Denies abnormal skin rashes MSK: (+) Joint pain of back and b/l legs  Lymphatics: Denies new lymphadenopathy or easy bruising Neurological:Denies numbness, tingling or new weaknesses Behavioral/Psych: Mood is stable, no new changes  All other systems were reviewed with the patient and are negative.  MEDICAL HISTORY:  Past Medical History:    Diagnosis Date  . Breast cancer ([place]) 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  . Diabetes mellitus ([place]) 09/16/13   Diagnosed on 09/16/13; HgA1C was 7.2/metformin  . Dizziness   . GERD (gastroesophageal reflux disease)   . Headache   . Hyperlipidemia   . Hypertension 2014  . Low back pain   . Obesity, morbid, BMI 40.0-49.9 ([place])   . PONV (postoperative nausea and vomiting)   . S/P radiation therapy 04/05/14-05/20/14   left breast 60.4Gy totaldose    SURGICAL HISTORY: Past Surgical History:  Procedure Laterality Date  . ABDOMINAL HYSTERECTOMY N/A 09/20/2013   Procedure: HYSTERECTOMY ABDOMINAL;  Surgeon: Osborne Oman, MD;  Location: [facility];  Service: Gynecology;  Laterality: N/A;  . BREAST LUMPECTOMY Left 2015   radiation  . BREAST LUMPECTOMY WITH AXILLARY LYMPH NODE BIOPSY Left 12/29/13   left breast   . LAPAROSCOPIC ASSISTED VAGINAL HYSTERECTOMY  09/20/2013   Procedure: LAPAROSCOPIC ASSISTED VAGINAL HYSTERECTOMY;  Surgeon: Osborne Oman, MD;  Location: [facility];  Service: Gynecology;;  PT WAS EXAMINED WHILE UP IN [place] AND IT WAS DECIDED TO OPEN PT DUE TO LARGE MASS  . LUMBAR LAMINECTOMY/DECOMPRESSION MICRODISCECTOMY Right 12/14/2014   Procedure: Right Lumbar three-four microdiskectomy;  Surgeon: Newman Pies, MD;  Location: [facility];  Service: Neurosurgery;  Laterality: Right;  Right Lumbar three-four microdiskectomy  . RADIOACTIVE SEED GUIDED PARTIAL MASTECTOMY WITH AXILLARY SENTINEL LYMPH NODE BIOPSY Left 12/29/2013   Procedure: LEFT BREAST RADIOACTIVE SEED LOCALIZED LUMPECTOMY WITH SENTINEL LYMPH NODE MAPPING;  Surgeon: Erroll Luna, MD;  Location: [facility];  Service: General;  Laterality: Left;  . RE-EXCISION OF BREAST LUMPECTOMY Left 03/02/2014   Procedure: RE-EXCISION OF LEFT BREAST LUMPECTOMY;  Surgeon: Erroll Luna, MD;  Location: [facility];  Service: General;  Laterality: Left;  . SALPINGOOPHORECTOMY  09/20/2013    Procedure: SALPINGO OOPHORECTOMY;  Surgeon: Osborne Oman, MD;  Location: [facility];  Service: Gynecology;;    I have reviewed the social history and family history with the patient and they are unchanged from previous note.  ALLERGIES:  has No Known Allergies.  MEDICATIONS:  Current Outpatient Medications  Medication Sig Dispense Refill  . gabapentin (NEURONTIN) 300 MG capsule Take 2 capsules by mouth as directed.    Marland Kitchen acetaminophen (TYLENOL) 500 MG tablet Take 1,000 mg by mouth every 6 (six) hours as needed for headache (pain).    Marland Kitchen albuterol (VENTOLIN HFA) 108 (90 Base) MCG/ACT inhaler Inhale 2 puffs into the lungs every 6 (six) hours as needed for wheezing or shortness of breath. 8 g 3  . anastrozole (ARIMIDEX) 1 MG tablet Take 1 tablet (1 mg total) by mouth daily. 90 tablet 3  . Ascorbic Acid (VITAMIN C) 1000 MG tablet Take 1,000 mg by mouth daily.    Marland Kitchen aspirin EC 325 MG tablet Take 325 mg by mouth daily.    . Blood Glucose Monitoring Suppl (ONE TOUCH ULTRA MINI) w/Device KIT USE AS DIRECTED ONCE DAILY DX CODE E11.9 1 each 0  . carvedilol (COREG) 3.125 MG tablet Take 1 tablet (3.125 mg total) by mouth 2 (two) times daily with a meal. 60  tablet 3  . cloNIDine (CATAPRES) 0.1 MG tablet Take 1 tablet (0.1 mg total) by mouth 2 (two) times daily. 180 tablet 1  . [facility] SENNA PLUS 8.6-50 MG tablet TAKE 2 TABLETS BY MOUTH AT BEDTIME AS NEEDED FOR MILD CONSTIPATION (Patient taking differently: Take 1 tablet by mouth at bedtime. ) 60 tablet 11  . hydrochlorothiazide (HYDRODIURIL) 25 MG tablet TAKE 1 TABLET BY MOUTH EVERYDAY AT BEDTIME 90 tablet 0  . metFORMIN (GLUCOPHAGE) 500 MG tablet Take 1.5 tablets (750 mg total) by mouth 2 (two) times daily. 270 tablet 3  . Multiple Vitamins-Minerals (MULTIVITAMIN WITH MINERALS) tablet Take 1 tablet by mouth daily. Reported on 03/13/2015    . omeprazole (PRILOSEC) 20 MG capsule TAKE 1 CAPSULE BY MOUTH EVERY DAY 90 capsule 0  . ONETOUCH DELICA LANCETS 54G MISC USE  AS DIRECTED ONCE DAILY DX CODE E11.9 100 each 12  . ONETOUCH ULTRA test strip TEST ONCE DAILY AS DIRECTED E11.9 100 strip 12  . potassium chloride (KLOR-CON) 10 MEQ tablet TAKE 1 TABLET BY MOUTH EVERY DAY 90 tablet 0  . pravastatin (PRAVACHOL) 20 MG tablet Take 1 tablet (20 mg total) by mouth daily. 90 tablet 3  . tizanidine (ZANAFLEX) 2 MG capsule Take 2 mg by mouth every 6 (six) hours as needed for muscle spasms. Take 2 tablets every 6 hours prn    . traMADol (ULTRAM) 50 MG tablet Take 1 tablet (50 mg total) by mouth every 4 (four) hours as needed. Fill on or after 02/12/2019 120 tablet 0   No current facility-administered medications for this visit.    PHYSICAL EXAMINATION: ECOG PERFORMANCE STATUS: 1 - Symptomatic but completely ambulatory  Vitals:   03/17/19 0927  BP: (!) 148/75  Pulse: 72  Resp: 17  Temp: 98.3 F (36.8 C)  SpO2: 98%   Filed Weights   03/17/19 0927  Weight: 230 lb 12.8 oz (104.7 kg)    GENERAL:alert, no distress and comfortable SKIN: skin color, texture, turgor are normal, no rashes or significant lesions EYES: normal, Conjunctiva are pink and non-injected, sclera clear  NECK: supple, thyroid normal size, non-tender, without nodularity LYMPH:  no palpable lymphadenopathy in the cervical, axillary  LUNGS: clear to auscultation and percussion with normal breathing effort HEART: regular rate & rhythm and no murmurs and no lower extremity edema ABDOMEN:abdomen soft, non-tender and normal bowel sounds (+) epigastric tenderness  Musculoskeletal:no cyanosis of digits and no clubbing  NEURO: alert & oriented x 3 with fluent speech, no focal motor/sensory deficits BREAST: S/p left lumpectomy and re-excision: Surgical incision healed well. No palpable mass, nodules or adenopathy bilaterally. Breast exam benign.   LABORATORY DATA:  I have reviewed the data as listed CBC Latest Ref Rng & Units 03/17/2019 10/19/2018 03/09/2018  WBC 4.0 - 10.5 K/uL 7.0 8.9 7.4  Hemoglobin  12.0 - 15.0 g/dL 12.2 13.5 12.2  Hematocrit 36.0 - 46.0 % 38.6 43.6 38.8  Platelets 150 - 400 K/uL 241 306 255     CMP Latest Ref Rng & Units 03/17/2019 10/19/2018 03/18/2018  Glucose 70 - 99 mg/dL 182(H) 127(H) 124(H)  BUN 8 - 23 mg/dL '21 21 18  ' Creatinine 0.44 - 1.00 mg/dL 1.07(H) 1.07(H) 1.11(H)  Sodium 135 - 145 mmol/L 143 139 143  Potassium 3.5 - 5.1 mmol/L 3.7 3.9 4.0  Chloride 98 - 111 mmol/L 104 99 99  CO2 22 - 32 mmol/L '28 28 22  ' Calcium 8.9 - 10.3 mg/dL 9.4 9.3 10.0  Total Protein 6.5 - 8.1  g/dL 7.2 7.3 -  Total Bilirubin 0.3 - 1.2 mg/dL 0.4 0.5 -  Alkaline Phos 38 - 126 U/L 51 52 -  AST 15 - 41 U/L 36 48(H) -  ALT 0 - 44 U/L 39 44 -      RADIOGRAPHIC STUDIES: I have personally reviewed the radiological images as listed and agreed with the findings in the report. No results found.   ASSESSMENT & PLAN:  Rachel Herman is a 69 y.o. 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 Grade 2, Ki-67 73%. -She was diagnosed in 12/2013.She is status post complete surgical resection with lumpectomy, 12/29/13 -I previously discussed the Oncotype DX result with her and her husband. The recurrence score is 23, with the predicted 10 year recurrence risk of 15% with tamoxifen alone. The benefit of chemotherapy is uncertain in this intermediate risk group.Adjuvant chemo was not recommended -She started adjuvant antiestrogen therapy withExemestanein 06/2014, but stopped in 12/2016 due to high copay. She started Anastrozolein 05/2017 and toleratesmoderatelywell with manageable joint painand hot flashes.  -She continues to have joint pain in back and b/l legs. I discussed this is likely related to oseoarthritis and although Anastrozole can contribute I encouraged her to continue until end of 2021 given its benefit. She should continue to f/u with PCP and orthopedist. She is  willing to continue for now.  -From a breast cancer standpoints she is clinically doing well. Lab reviewed, her CBC and CMP are within normal limits except BG 182. Her physical exam and her 03/2019 mammogram were unremarkable. There is no clinical concern for recurrence. -Continue surveillance. Next mammogram in 03/2020 -Continue Anastrozole  -F/u in 9 months then yearly.    2.Bone health -Her bone density scan was normal in 05/2014 -Normal bone density, T score -0.3 on 03/03/17 -She will continue calcium and vitamin D. -Will repeat on 04/01/19  3. Hypertension, diabetes, dyslipidemia, chronic low back pain, arthritis  -She will continue follow-up with her primary care physician. -HTN and DM well controlled.  -Chronic low back and b/l leg pain has worsened in the last few month per patient. I encouraged her to f/u with orthopedist for more injections in needed and her PCP.     4.Morbid obesity   -I discussed that Antiestrogen therapy can increase her appetite and weight. I encouraged her to reduce carbohydrates in her diet and try water exercise and walking due to her leg and back pain.  -I encouraged her to manage her weight.Weight is stable.   5.Right lung nodule, likely benign -She had a CT abdomen and pelvis in June 2016, which incidentally found a 4 mm nodule in the right lower lobe. I personally reviewed the image.  -She had CT chest on 08/18/2015 for chest pain, which showed stable 92m right lung base subpleural nodule, likely benign, no need to follow-up since she is a never smoker.  6. Thyroid Benign follicular nodule -Her CT scan revealed a 1.7 cm right thyroid nodule. -Ultrasound guided thyroid nodule biopsy on 05/09/2015 showed benign follicular nodule    PLAN: -She is clinically doing well and stable.  -Continue anastrozole until the end of 2021 if she can tolerated  -she will f/u with PCP for her arthralgia  -DEXA on 04/01/19 -Lab and f/u in 9 months   No  problem-specific Assessment & Plan notes found for this encounter.   No orders of the defined types were  placed in this encounter.  All questions were answered. The patient knows to call the clinic with any problems, questions or concerns. No barriers to learning was detected. The total time spent in the appointment was 30 minutes.     Truitt Merle, MD 03/17/2019   I, Joslyn Devon, am acting as scribe for Truitt Merle, MD.   I have reviewed the above documentation for accuracy and completeness, and I agree with the above.

## 2019-03-12 NOTE — Telephone Encounter (Signed)
Returned patient's phone call regarding missed 02/03 appointment, per patient's request appointment has moved to 02/10.

## 2019-03-15 ENCOUNTER — Ambulatory Visit: Payer: Medicare Other | Admitting: Pharmacist

## 2019-03-15 DIAGNOSIS — H2513 Age-related nuclear cataract, bilateral: Secondary | ICD-10-CM | POA: Diagnosis not present

## 2019-03-15 DIAGNOSIS — E119 Type 2 diabetes mellitus without complications: Secondary | ICD-10-CM | POA: Diagnosis not present

## 2019-03-15 DIAGNOSIS — H40013 Open angle with borderline findings, low risk, bilateral: Secondary | ICD-10-CM | POA: Diagnosis not present

## 2019-03-17 ENCOUNTER — Other Ambulatory Visit: Payer: Self-pay

## 2019-03-17 ENCOUNTER — Inpatient Hospital Stay (HOSPITAL_BASED_OUTPATIENT_CLINIC_OR_DEPARTMENT_OTHER): Payer: Medicare Other | Admitting: Hematology

## 2019-03-17 ENCOUNTER — Encounter: Payer: Self-pay | Admitting: Hematology

## 2019-03-17 ENCOUNTER — Inpatient Hospital Stay: Payer: Medicare Other

## 2019-03-17 VITALS — BP 148/75 | HR 72 | Temp 98.3°F | Resp 17 | Ht 67.0 in | Wt 230.8 lb

## 2019-03-17 DIAGNOSIS — I10 Essential (primary) hypertension: Secondary | ICD-10-CM

## 2019-03-17 DIAGNOSIS — R079 Chest pain, unspecified: Secondary | ICD-10-CM | POA: Diagnosis not present

## 2019-03-17 DIAGNOSIS — Z90721 Acquired absence of ovaries, unilateral: Secondary | ICD-10-CM | POA: Diagnosis not present

## 2019-03-17 DIAGNOSIS — M79605 Pain in left leg: Secondary | ICD-10-CM | POA: Diagnosis not present

## 2019-03-17 DIAGNOSIS — E041 Nontoxic single thyroid nodule: Secondary | ICD-10-CM | POA: Diagnosis not present

## 2019-03-17 DIAGNOSIS — C50212 Malignant neoplasm of upper-inner quadrant of left female breast: Secondary | ICD-10-CM

## 2019-03-17 DIAGNOSIS — Z79811 Long term (current) use of aromatase inhibitors: Secondary | ICD-10-CM | POA: Diagnosis not present

## 2019-03-17 DIAGNOSIS — E785 Hyperlipidemia, unspecified: Secondary | ICD-10-CM | POA: Diagnosis not present

## 2019-03-17 DIAGNOSIS — E119 Type 2 diabetes mellitus without complications: Secondary | ICD-10-CM | POA: Diagnosis not present

## 2019-03-17 DIAGNOSIS — M549 Dorsalgia, unspecified: Secondary | ICD-10-CM | POA: Diagnosis not present

## 2019-03-17 DIAGNOSIS — M2550 Pain in unspecified joint: Secondary | ICD-10-CM | POA: Diagnosis not present

## 2019-03-17 DIAGNOSIS — M545 Low back pain: Secondary | ICD-10-CM | POA: Diagnosis not present

## 2019-03-17 DIAGNOSIS — R911 Solitary pulmonary nodule: Secondary | ICD-10-CM | POA: Diagnosis not present

## 2019-03-17 DIAGNOSIS — Z170 Estrogen receptor positive status [ER+]: Secondary | ICD-10-CM

## 2019-03-17 DIAGNOSIS — R232 Flushing: Secondary | ICD-10-CM | POA: Diagnosis not present

## 2019-03-17 DIAGNOSIS — M1990 Unspecified osteoarthritis, unspecified site: Secondary | ICD-10-CM | POA: Diagnosis not present

## 2019-03-17 DIAGNOSIS — M79604 Pain in right leg: Secondary | ICD-10-CM | POA: Diagnosis not present

## 2019-03-17 DIAGNOSIS — Z79899 Other long term (current) drug therapy: Secondary | ICD-10-CM | POA: Diagnosis not present

## 2019-03-17 DIAGNOSIS — G8929 Other chronic pain: Secondary | ICD-10-CM | POA: Diagnosis not present

## 2019-03-17 DIAGNOSIS — Z7984 Long term (current) use of oral hypoglycemic drugs: Secondary | ICD-10-CM | POA: Diagnosis not present

## 2019-03-17 LAB — COMPREHENSIVE METABOLIC PANEL
ALT: 39 U/L (ref 0–44)
AST: 36 U/L (ref 15–41)
Albumin: 3.6 g/dL (ref 3.5–5.0)
Alkaline Phosphatase: 51 U/L (ref 38–126)
Anion gap: 11 (ref 5–15)
BUN: 21 mg/dL (ref 8–23)
CO2: 28 mmol/L (ref 22–32)
Calcium: 9.4 mg/dL (ref 8.9–10.3)
Chloride: 104 mmol/L (ref 98–111)
Creatinine, Ser: 1.07 mg/dL — ABNORMAL HIGH (ref 0.44–1.00)
GFR calc Af Amer: >60 mL/min (ref >60)
GFR calc non Af Amer: 53 mL/min — ABNORMAL LOW (ref >60)
Glucose, Bld: 182 mg/dL — ABNORMAL HIGH (ref 70–99)
Potassium: 3.7 mmol/L (ref 3.5–5.1)
Sodium: 143 mmol/L (ref 135–145)
Total Bilirubin: 0.4 mg/dL (ref 0.3–1.2)
Total Protein: 7.2 g/dL (ref 6.5–8.1)

## 2019-03-17 LAB — CBC WITH DIFFERENTIAL/PLATELET
Abs Immature Granulocytes: 0.02 10*3/uL (ref 0.00–0.07)
Basophils Absolute: 0 10*3/uL (ref 0.0–0.1)
Basophils Relative: 0 %
Eosinophils Absolute: 0.3 10*3/uL (ref 0.0–0.5)
Eosinophils Relative: 4 %
HCT: 38.6 % (ref 36.0–46.0)
Hemoglobin: 12.2 g/dL (ref 12.0–15.0)
Immature Granulocytes: 0 %
Lymphocytes Relative: 26 %
Lymphs Abs: 1.9 10*3/uL (ref 0.7–4.0)
MCH: 27.4 pg (ref 26.0–34.0)
MCHC: 31.6 g/dL (ref 30.0–36.0)
MCV: 86.7 fL (ref 80.0–100.0)
Monocytes Absolute: 0.5 10*3/uL (ref 0.1–1.0)
Monocytes Relative: 7 %
Neutro Abs: 4.4 10*3/uL (ref 1.7–7.7)
Neutrophils Relative %: 63 %
Platelets: 241 10*3/uL (ref 150–400)
RBC: 4.45 MIL/uL (ref 3.87–5.11)
RDW: 14.1 % (ref 11.5–15.5)
WBC: 7 10*3/uL (ref 4.0–10.5)
nRBC: 0 % (ref 0.0–0.2)

## 2019-03-18 ENCOUNTER — Telehealth: Payer: Self-pay | Admitting: Internal Medicine

## 2019-03-18 DIAGNOSIS — M5416 Radiculopathy, lumbar region: Secondary | ICD-10-CM

## 2019-03-18 DIAGNOSIS — G8929 Other chronic pain: Secondary | ICD-10-CM

## 2019-03-18 MED ORDER — TRAMADOL HCL 50 MG PO TABS: 50.0000 mg | ORAL_TABLET | ORAL | 0 refills | Status: DC | PRN

## 2019-03-18 NOTE — Telephone Encounter (Signed)
1) Medication(s) Requested (by name): traMADol (ULTRAM) 50 MG tablet [id number]  2) [id number] [place], [place]  [address], [place] [address]     Approved medications will be sent to pharmacy, we will reach out to you if there is an issue.  Requests made after 3pm may not be addressed until following business day!

## 2019-03-18 NOTE — Addendum Note (Signed)
Addended by: Karle Plumber B on: 03/18/2019 12:33 PM   Modules accepted: Orders

## 2019-03-27 ENCOUNTER — Other Ambulatory Visit: Payer: Self-pay | Admitting: Critical Care Medicine

## 2019-03-27 DIAGNOSIS — E1169 Type 2 diabetes mellitus with other specified complication: Secondary | ICD-10-CM

## 2019-04-01 ENCOUNTER — Other Ambulatory Visit: Payer: Self-pay

## 2019-04-01 ENCOUNTER — Ambulatory Visit
Admission: RE | Admit: 2019-04-01 | Discharge: 2019-04-01 | Disposition: A | Discharge status: Home / Self Care | Payer: Medicare Other | Source: Ambulatory Visit | Attending: Hematology | Admitting: Hematology

## 2019-04-01 DIAGNOSIS — Z780 Asymptomatic menopausal state: Secondary | ICD-10-CM | POA: Diagnosis not present

## 2019-04-01 DIAGNOSIS — E2839 Other primary ovarian failure: Secondary | ICD-10-CM

## 2019-04-05 ENCOUNTER — Telehealth: Payer: Self-pay

## 2019-04-05 ENCOUNTER — Ambulatory Visit: Payer: Medicare Other | Attending: Internal Medicine

## 2019-04-05 DIAGNOSIS — Z23 Encounter for immunization: Secondary | ICD-10-CM | POA: Insufficient documentation

## 2019-04-05 NOTE — Progress Notes (Signed)
   Covid-19 Vaccination Clinic  Name:  Rachel Herman    MRN: [id number] DOB: October 04, 1950  04/05/2019  [name] was observed post Covid-19 immunization for 15 minutes without incidence. She was provided with Vaccine Information Sheet and instruction to access the V-Safe system.   [name] was instructed to call 911 with any severe reactions post vaccine: Marland Kitchen Difficulty breathing  . Swelling of your face and throat  . A fast heartbeat  . A bad rash all over your body  . Dizziness and weakness    Immunizations Administered    Name Date Dose VIS Date Route   Pfizer COVID-19 Vaccine 04/05/2019 10:17 AM 0.3 mL 01/15/2019 Intramuscular   Manufacturer: [place]   Lot: [id number]   [place]: [id number]

## 2019-04-05 NOTE — Telephone Encounter (Signed)
-----   Message from Truitt Merle, MD sent at 04/04/2019 10:11 AM EST ----- Please let pt know her DEXA was normal, thanks   Truitt Merle  04/04/2019

## 2019-04-05 NOTE — Telephone Encounter (Signed)
Left message about normal dexa scan

## 2019-04-05 NOTE — Telephone Encounter (Signed)
Left message for pt to return call for results of Dexa scan.

## 2019-04-28 ENCOUNTER — Ambulatory Visit: Payer: Medicare Other | Attending: Internal Medicine

## 2019-04-28 DIAGNOSIS — Z23 Encounter for immunization: Secondary | ICD-10-CM

## 2019-04-28 NOTE — Progress Notes (Signed)
   Covid-19 Vaccination Clinic  Name:  Rachel Herman    MRN: [id number] DOB: [birth year]  04/28/2019  Rachel Herman was observed post Covid-19 immunization for 15 minutes without incident. She was provided with Vaccine Information Sheet and instruction to access the V-Safe system.   Rachel Herman was instructed to call 911 with any severe reactions post vaccine: Marland Kitchen Difficulty breathing  . Swelling of face and throat  . A fast heartbeat  . A bad rash all over body  . Dizziness and weakness   Immunizations Administered    Name Date Dose VIS Date Route   Pfizer COVID-19 Vaccine 04/28/2019 10:19 AM 0.3 mL 01/15/2019 Intramuscular   Manufacturer: [place]   Lot: [id number]   [place]: [id number]

## 2019-04-29 ENCOUNTER — Telehealth: Payer: Self-pay | Admitting: Internal Medicine

## 2019-04-29 ENCOUNTER — Other Ambulatory Visit: Payer: Self-pay | Admitting: Hematology

## 2019-04-29 DIAGNOSIS — G8929 Other chronic pain: Secondary | ICD-10-CM

## 2019-04-29 MED ORDER — TRAMADOL HCL 50 MG PO TABS: 50.0000 mg | ORAL_TABLET | ORAL | 0 refills | Status: DC | PRN

## 2019-04-29 NOTE — Telephone Encounter (Signed)
1) Medication(s) Requested (by name):traMADol (ULTRAM) 50 MG tablet [id number]   2) [id number] [place], [place] - [address]   3) Special Requests:   Approved medications will be sent to the pharmacy, we will reach out if there is an issue.  Requests made after 3pm may not be addressed until the following business day!  If a patient is unsure of the name of the medication(s) please note and ask patient to call back when they are able to provide all info, do not send to responsible party until all information is available!

## 2019-05-04 ENCOUNTER — Other Ambulatory Visit: Payer: Self-pay | Admitting: Internal Medicine

## 2019-05-04 DIAGNOSIS — I10 Essential (primary) hypertension: Secondary | ICD-10-CM

## 2019-05-28 ENCOUNTER — Other Ambulatory Visit: Payer: Self-pay | Admitting: Internal Medicine

## 2019-05-28 DIAGNOSIS — E876 Hypokalemia: Secondary | ICD-10-CM

## 2019-05-28 DIAGNOSIS — I10 Essential (primary) hypertension: Secondary | ICD-10-CM

## 2019-05-29 ENCOUNTER — Other Ambulatory Visit: Payer: Self-pay | Admitting: Internal Medicine

## 2019-05-29 DIAGNOSIS — I152 Hypertension secondary to endocrine disorders: Secondary | ICD-10-CM

## 2019-05-31 ENCOUNTER — Ambulatory Visit: Payer: Medicare Other | Admitting: Internal Medicine

## 2019-06-07 ENCOUNTER — Other Ambulatory Visit: Payer: Self-pay | Admitting: Internal Medicine

## 2019-06-07 DIAGNOSIS — I152 Hypertension secondary to endocrine disorders: Secondary | ICD-10-CM

## 2019-06-08 ENCOUNTER — Other Ambulatory Visit: Payer: Self-pay

## 2019-06-08 ENCOUNTER — Encounter: Payer: Self-pay | Admitting: Internal Medicine

## 2019-06-08 ENCOUNTER — Ambulatory Visit: Payer: Medicare Other | Attending: Internal Medicine | Admitting: Internal Medicine

## 2019-06-08 VITALS — BP 103/68 | HR 77 | Temp 95.0°F | Resp 16 | Wt 225.8 lb

## 2019-06-08 DIAGNOSIS — M5416 Radiculopathy, lumbar region: Secondary | ICD-10-CM

## 2019-06-08 DIAGNOSIS — E1169 Type 2 diabetes mellitus with other specified complication: Secondary | ICD-10-CM

## 2019-06-08 DIAGNOSIS — E669 Obesity, unspecified: Secondary | ICD-10-CM

## 2019-06-08 DIAGNOSIS — E785 Hyperlipidemia, unspecified: Secondary | ICD-10-CM

## 2019-06-08 DIAGNOSIS — I10 Essential (primary) hypertension: Secondary | ICD-10-CM

## 2019-06-08 DIAGNOSIS — N1831 Chronic kidney disease, stage 3a: Secondary | ICD-10-CM | POA: Diagnosis not present

## 2019-06-08 DIAGNOSIS — G8929 Other chronic pain: Secondary | ICD-10-CM

## 2019-06-08 LAB — POCT GLYCOSYLATED HEMOGLOBIN (HGB A1C): HbA1c, POC (prediabetic range): 5.9 % (ref 5.7–6.4)

## 2019-06-08 LAB — GLUCOSE, POCT (MANUAL RESULT ENTRY): POC Glucose: 121 mg/dl — AB (ref 70–99)

## 2019-06-08 MED ORDER — TRAMADOL HCL 50 MG PO TABS: 50.0000 mg | ORAL_TABLET | ORAL | 0 refills | Status: DC | PRN

## 2019-06-08 MED ORDER — CLONIDINE HCL 0.1 MG PO TABS: 0.1000 mg | ORAL_TABLET | Freq: Two times a day (BID) | ORAL | 6 refills | Status: DC

## 2019-06-08 NOTE — Progress Notes (Signed)
Patient ID: Rachel Herman, female    DOB: 18-Sep-1950  MRN: [id number]  CC: Diabetes and Hypertension   Subjective: Rachel Herman is a 69 y.o. female who presents for chronic ds management Her concerns today include:  Pt with hx of HTN, DM, HL, DJD lumbar and spinal stenosiswith hx of laminectomy 2016, obesity, depression,DuctalCA LT s/plumpectomy/XRT (2015 and 2016, followed by Dr. Annamaria Boots), vaginal prolapse, CKD 3.  Chronic Back Pain: Still bothered with back pain that radiates into the legs especially if she does too much.  Doing Okay on Tramadol and Gabapentin without major side effects..  Uses Tramadol Q 6 hrs  DIABETES TYPE 2 Last A1C:   Results for orders placed or performed in visit on 06/08/19  POCT glucose (manual entry)  Result Value Ref Range   POC Glucose 121 (A) 70 - 99 mg/dl  POCT glycosylated hemoglobin (Hb A1C)  Result Value Ref Range   Hemoglobin A1C     HbA1c POC (<> result, manual entry)     HbA1c, POC (prediabetic range) 5.9 5.7 - 6.4 %   HbA1c, POC (controlled diabetic range)      Med Adherence:  '[x]'  Yes    '[]'  No Medication side effects:  '[]'  Yes    '[x]'  No Home Monitoring?  '[x]'  Yes twice a day    '[]'  No Home glucose results range:highest 150. Diet Adherence: '[x]'  Yes    '[]'  No Exercise: '[x]'  Yes -works in her yard a lot   Hypoglycemic episodes?: '[x]'  Yes - occasional    '[]'  No Numbness of the feet? '[]'  Yes    '[x]'  No Retinopathy hx? '[]'  Yes    '[x]'  No Last eye exam:  Comments:   HYPERTENSION Currently taking: see medication list Med Adherence: '[x]'  Yes - Coreg added on last visit    '[]'  No Medication side effects: '[]'  Yes    '[x]'  No Adherence with salt restriction: '[x]'  Yes    '[]'  No Home Monitoring?: '[x]'  Yes    '[]'  No Monitoring Frequency: Q 3-4 days Home BP results range: less than 130/80 SOB? '[]'  Yes    '[x]'  No Chest Pain?: '[]'  Yes    '[x]'  No Leg swelling?: '[]'  Yes    '[x]'  No Headaches?: '[]'  Yes    '[x]'  No Dizziness? '[]'  Yes    '[x]'  No Comments:     Patient Active Problem List   Diagnosis Date Noted  . Chronic cough 08/03/2018  . Other intervertebral disc degeneration, lumbar region 03/18/2018  . 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  . 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 Lumbar pain with radiation down left and right leg 07/02/2016  . Thyroid nodule 06/09/2015  . Nodule of right lung 07/28/2014  . Diverticulosis of colon without hemorrhage 07/28/2014  . DJD (degenerative joint disease), lumbar 02/22/2014  . Lumbar pain with radiation down left leg 01/17/2014  . Breast cancer of upper-inner quadrant of left female breast ([place]) 01/05/2014  . Hyperlipidemia associated with type 2 diabetes mellitus ([place]) 09/28/2013  . S/P total hysterectomy and bilateral salpingo-oophorectomy 09/20/2013  . Essential hypertension   . Diabetes mellitus ([place]) 09/16/2013     Current Outpatient Medications on File Prior to Visit  Medication Sig Dispense Refill  . carvedilol (COREG) 3.125 MG tablet TAKE 1 TABLET BY MOUTH 2 TIMES DAILY WITH A MEAL. 180 tablet 0  . acetaminophen (TYLENOL) 500 MG tablet Take 1,000 mg by mouth every 6 (six) hours as needed for headache (pain).    Marland Kitchen  albuterol (VENTOLIN HFA) 108 (90 Base) MCG/ACT inhaler Inhale 2 puffs into the lungs every 6 (six) hours as needed for wheezing or shortness of breath. 8 g 3  . anastrozole (ARIMIDEX) 1 MG tablet TAKE 1 TABLET BY MOUTH EVERY DAY 90 tablet 3  . Ascorbic Acid (VITAMIN C) 1000 MG tablet Take 1,000 mg by mouth daily.    Marland Kitchen aspirin EC 325 MG tablet Take 325 mg by mouth daily.    . Blood Glucose Monitoring Suppl (ONE TOUCH ULTRA MINI) w/Device KIT USE AS DIRECTED ONCE DAILY DX CODE E11.9 1 each 0  . [facility] SENNA PLUS 8.6-50 MG tablet TAKE 2 TABLETS BY MOUTH AT BEDTIME AS NEEDED FOR MILD CONSTIPATION (Patient taking differently: Take 1 tablet by mouth at bedtime. ) 60 tablet 11  . gabapentin (NEURONTIN) 300 MG capsule Take 2 capsules by mouth as directed.     . hydrochlorothiazide (HYDRODIURIL) 25 MG tablet TAKE 1 TABLET BY MOUTH EVERYDAY AT BEDTIME 90 tablet 0  . metFORMIN (GLUCOPHAGE) 500 MG tablet Take 1.5 tablets (750 mg total) by mouth 2 (two) times daily. 270 tablet 3  . Multiple Vitamins-Minerals (MULTIVITAMIN WITH MINERALS) tablet Take 1 tablet by mouth daily. Reported on 03/13/2015    . omeprazole (PRILOSEC) 20 MG capsule TAKE 1 CAPSULE BY MOUTH EVERY DAY 90 capsule 0  . ONETOUCH DELICA LANCETS 20N MISC USE AS DIRECTED ONCE DAILY DX CODE E11.9 100 each 12  . ONETOUCH ULTRA test strip TEST ONCE DAILY AS DIRECTED E11.9 100 strip 12  . potassium chloride (KLOR-CON) 10 MEQ tablet TAKE 1 TABLET BY MOUTH EVERY DAY 90 tablet 0  . pravastatin (PRAVACHOL) 20 MG tablet TAKE 1 TABLET BY MOUTH EVERY DAY 90 tablet 0  . tizanidine (ZANAFLEX) 2 MG capsule Take 2 mg by mouth every 6 (six) hours as needed for muscle spasms. Take 2 tablets every 6 hours prn     No current facility-administered medications on file prior to visit.    No Known Allergies  Social History   Socioeconomic History  . Marital status: Married    Spouse name: Kemaria Dedic   . Number of children: 3  . Years of education: 57  . Highest education level: Not on file  Occupational History    Employer: UNEMPLOYED  Tobacco Use  . Smoking status: Never Smoker  . Smokeless tobacco: Never Used  Substance and Sexual Activity  . Alcohol use: No  . Drug use: No  . Sexual activity: Not Currently    Birth control/protection: Post-menopausal  Other Topics Concern  . Not on file  Social History Narrative   Married to [employer] in 1986.    Has 3 children from previous marriage, 1 in [place], 1 in [place], 1 in prison ([place]).    Lives with husband.    Right-handed.   1 cup caffeine per day.   Social Determinants of Health   Financial Resource Strain:   . Difficulty of Paying Living Expenses:   Food Insecurity:   . Worried About Charity fundraiser in the Last  Year:   . Arboriculturist in the Last Year:   Transportation Needs:   . Film/video editor (Medical):   Marland Kitchen Lack of Transportation (Non-Medical):   Physical Activity:   . Days of Exercise per Week:   . Minutes of Exercise per Session:   Stress:   . Feeling of Stress :   Social Connections:   . Frequency of Communication with Friends and Family:   .  Frequency of Social Gatherings with Friends and Family:   . Attends Religious Services:   . Active Member of Clubs or Organizations:   . Attends Archivist Meetings:   Marland Kitchen Marital Status:   Intimate Partner Violence:   . Fear of Current or Ex-Partner:   . Emotionally Abused:   Marland Kitchen Physically Abused:   . Sexually Abused:     Family History  Problem Relation Age of Onset  . Diabetes Mother   . Multiple myeloma Mother 9  . Hearing loss Mother   . Diabetes Brother   . Cancer Brother 40       prostate cancer   . Diabetes Maternal Aunt   . Leukemia Maternal Aunt        dx in her 46s  . Alcoholism Brother   . Heart attack Father   . Diabetes Sister   . Diabetes Son     Past Surgical History:  Procedure Laterality Date  . ABDOMINAL HYSTERECTOMY N/A 09/20/2013   Procedure: HYSTERECTOMY ABDOMINAL;  Surgeon: Osborne Oman, MD;  Location: [facility];  Service: Gynecology;  Laterality: N/A;  . BREAST LUMPECTOMY Left 2015   radiation  . BREAST LUMPECTOMY WITH AXILLARY LYMPH NODE BIOPSY Left 12/29/13   left breast   . LAPAROSCOPIC ASSISTED VAGINAL HYSTERECTOMY  09/20/2013   Procedure: LAPAROSCOPIC ASSISTED VAGINAL HYSTERECTOMY;  Surgeon: Osborne Oman, MD;  Location: [facility];  Service: Gynecology;;  PT WAS EXAMINED WHILE UP IN [place] AND IT WAS DECIDED TO OPEN PT DUE TO LARGE MASS  . LUMBAR LAMINECTOMY/DECOMPRESSION MICRODISCECTOMY Right 12/14/2014   Procedure: Right Lumbar three-four microdiskectomy;  Surgeon: Newman Pies, MD;  Location: [facility];  Service: Neurosurgery;  Laterality: Right;  Right Lumbar three-four  microdiskectomy  . RADIOACTIVE SEED GUIDED PARTIAL MASTECTOMY WITH AXILLARY SENTINEL LYMPH NODE BIOPSY Left 12/29/2013   Procedure: LEFT BREAST RADIOACTIVE SEED LOCALIZED LUMPECTOMY WITH SENTINEL LYMPH NODE MAPPING;  Surgeon: Erroll Luna, MD;  Location: [facility];  Service: General;  Laterality: Left;  . RE-EXCISION OF BREAST LUMPECTOMY Left 03/02/2014   Procedure: RE-EXCISION OF LEFT BREAST LUMPECTOMY;  Surgeon: Erroll Luna, MD;  Location: [facility];  Service: General;  Laterality: Left;  . SALPINGOOPHORECTOMY  09/20/2013   Procedure: SALPINGO OOPHORECTOMY;  Surgeon: Osborne Oman, MD;  Location: [facility];  Service: Gynecology;;    ROS: Review of Systems Negative except as stated above  PHYSICAL EXAM: BP 103/68   Pulse 77   Temp (!) 95 F (35 C)   Resp 16   Wt 225 lb 12.8 oz (102.4 kg)   SpO2 95%   BMI 35.37 kg/m   Wt Readings from Last 3 Encounters:  06/08/19 225 lb 12.8 oz (102.4 kg)  03/17/19 230 lb 12.8 oz (104.7 kg)  02/08/19 230 lb 3.2 oz (104.4 kg)    Physical Exam  General appearance - alert, well appearing, obese elderly Caucasian female and in no distress Mental status - normal mood, behavior, speech, dress, motor activity, and thought processes Neck - supple, no significant adenopathy Chest - clear to auscultation, no wheezes, rales or rhonchi, symmetric air entry Heart - normal rate, regular rhythm, normal S1, S2, no murmurs, rubs, clicks or gallops Extremities - peripheral pulses normal, no pedal edema, no clubbing or cyanosis Varicose veins on LE and feet Diabetic Foot Exam - Simple   Simple Foot Form Visual Inspection No deformities, no ulcerations, no other skin breakdown bilaterally: Yes Sensation Testing Intact to touch and monofilament testing  bilaterally: Yes Pulse Check Posterior Tibialis and Dorsalis pulse intact bilaterally: Yes Comments      CMP Latest Ref Rng & Units 03/17/2019 10/19/2018 03/18/2018    Glucose 70 - 99 mg/dL 182(H) 127(H) 124(H)  BUN 8 - 23 mg/dL '21 21 18  ' Creatinine 0.44 - 1.00 mg/dL 1.07(H) 1.07(H) 1.11(H)  Sodium 135 - 145 mmol/L 143 139 143  Potassium 3.5 - 5.1 mmol/L 3.7 3.9 4.0  Chloride 98 - 111 mmol/L 104 99 99  CO2 22 - 32 mmol/L '28 28 22  ' Calcium 8.9 - 10.3 mg/dL 9.4 9.3 10.0  Total Protein 6.5 - 8.1 g/dL 7.2 7.3 -  Total Bilirubin 0.3 - 1.2 mg/dL 0.4 0.5 -  Alkaline Phos 38 - 126 U/L 51 52 -  AST 15 - 41 U/L 36 48(H) -  ALT 0 - 44 U/L 39 44 -   Lipid Panel     Component Value Date/Time   CHOL 190 01/22/2018 1201   TRIG 286 (H) 01/22/2018 1201   HDL 41 01/22/2018 1201   CHOLHDL 4.6 (H) 01/22/2018 1201   CHOLHDL 5.9 09/27/2013 1053   VLDL 48 (H) 09/27/2013 1053   LDLCALC 92 01/22/2018 1201    CBC    Component Value Date/Time   WBC 7.0 03/17/2019 0904   RBC 4.45 03/17/2019 0904   HGB 12.2 03/17/2019 0904   HGB 14.5 11/08/2016 0915   HCT 38.6 03/17/2019 0904   HCT 44.2 11/08/2016 0915   PLT 241 03/17/2019 0904   PLT 295 11/08/2016 0915   MCV 86.7 03/17/2019 0904   MCV 85.3 11/08/2016 0915   MCH 27.4 03/17/2019 0904   MCHC 31.6 03/17/2019 0904   RDW 14.1 03/17/2019 0904   RDW 15.1 (H) 11/08/2016 0915   LYMPHSABS 1.9 03/17/2019 0904   LYMPHSABS 1.7 11/08/2016 0915   MONOABS 0.5 03/17/2019 0904   MONOABS 0.7 11/08/2016 0915   EOSABS 0.3 03/17/2019 0904   EOSABS 0.1 11/08/2016 0915   BASOSABS 0.0 03/17/2019 0904   BASOSABS 0.0 11/08/2016 0915    ASSESSMENT AND PLAN: 1. Type 2 diabetes mellitus with obesity (HCC) At goal.  Continue healthy eating habits and regular exercise as tolerated. Continue Metformin - POCT glucose (manual entry) - POCT glycosylated hemoglobin (Hb A1C)  2. Essential hypertension At goal.  Continue carvedilol, clonidine and HCTZ.  Continue low-salt diet - cloNIDine (CATAPRES) 0.1 MG tablet; Take 1 tablet (0.1 mg total) by mouth 2 (two) times daily.  Dispense: 60 tablet; Refill: 6  3. Hyperlipidemia associated  with type 2 diabetes mellitus (HCC) Continue pravastatin  4. Stage 3a chronic kidney disease Stable avoid NSAIDs  5. Chronic radicular pain of lower back Doing okay on tramadol and gabapentin.  No significant side effects.  She is up-to-date with controlled substance prescribing agreement and urine drug screen was done within the past year.  [place] controlled substance reporting system reviewed and is appropriate.  Refill given on tramadol - traMADol (ULTRAM) 50 MG tablet; Take 1 tablet (50 mg total) by mouth every 4 (four) hours as needed.  Dispense: 120 tablet; Refill: 0    Patient was given the opportunity to ask questions.  Patient verbalized understanding of the plan and was able to repeat key elements of the plan.   Orders Placed This Encounter  Procedures  . POCT glucose (manual entry)  . POCT glycosylated hemoglobin (Hb A1C)     Requested Prescriptions   Signed Prescriptions Disp Refills  . cloNIDine (CATAPRES) 0.1 MG tablet 60 tablet  6    Sig: Take 1 tablet (0.1 mg total) by mouth 2 (two) times daily.  . traMADol (ULTRAM) 50 MG tablet 120 tablet 0    Sig: Take 1 tablet (50 mg total) by mouth every 4 (four) hours as needed.    Return in about 4 months (around 10/09/2019).  Karle Plumber, MD, FACP

## 2019-06-16 ENCOUNTER — Other Ambulatory Visit: Payer: Self-pay | Admitting: Internal Medicine

## 2019-06-16 DIAGNOSIS — I10 Essential (primary) hypertension: Secondary | ICD-10-CM

## 2019-06-24 ENCOUNTER — Other Ambulatory Visit: Payer: Self-pay | Admitting: Internal Medicine

## 2019-06-24 DIAGNOSIS — E1169 Type 2 diabetes mellitus with other specified complication: Secondary | ICD-10-CM

## 2019-06-24 DIAGNOSIS — E785 Hyperlipidemia, unspecified: Secondary | ICD-10-CM

## 2019-06-30 DIAGNOSIS — M25552 Pain in left hip: Secondary | ICD-10-CM | POA: Diagnosis not present

## 2019-06-30 DIAGNOSIS — M549 Dorsalgia, unspecified: Secondary | ICD-10-CM | POA: Diagnosis not present

## 2019-06-30 DIAGNOSIS — M79605 Pain in left leg: Secondary | ICD-10-CM | POA: Diagnosis not present

## 2019-07-14 ENCOUNTER — Other Ambulatory Visit: Payer: Self-pay | Admitting: *Deleted

## 2019-07-14 NOTE — Patient Outreach (Signed)
[facility]) Care Management  07/14/2019  Rachel Herman Aug 15, 1950 [id number]   Successful telephone outreach call to patient for screening. Patient answered the phone and stated that she did receive a letter and a [facility] pamphlet in the mail last week. Patient states that she is not interested in participating with the [facility] program at this time.   Emelia Loron RN, BSN [place] [phone] [email or web address]

## 2019-07-15 ENCOUNTER — Telehealth: Payer: Self-pay | Admitting: Internal Medicine

## 2019-07-15 DIAGNOSIS — G8929 Other chronic pain: Secondary | ICD-10-CM

## 2019-07-15 NOTE — Telephone Encounter (Signed)
Will forward to covering provider.

## 2019-07-15 NOTE — Telephone Encounter (Signed)
Patient called in and requested for listed medication to be refilled.  traMADol (ULTRAM) 50 MG tablet [[id number]  [id number] [place], [place] - [address]

## 2019-07-15 NOTE — Telephone Encounter (Signed)
Patient called in to check on her mri Dr Zigmund Daniel sent a request to pcp regarding matter. Please follow up at your earliest convenience.

## 2019-07-16 MED ORDER — TRAMADOL HCL 50 MG PO TABS: 50.0000 mg | ORAL_TABLET | ORAL | 0 refills | Status: DC | PRN

## 2019-07-16 NOTE — Telephone Encounter (Signed)
Patient was called, verified by 2 patient identifiers, was informed of requested medication being refilled. Patient verbalized understanding and had no further questions or concerns at this time.

## 2019-07-16 NOTE — Telephone Encounter (Signed)
Refilled

## 2019-07-19 NOTE — Telephone Encounter (Signed)
Returned pt call and pt states that Dr. Zigmund Daniel suggested a MRI. Made pt  aware that If they are wanting her to do a MRI they will need to order it

## 2019-07-30 MED ORDER — TRIAMCINOLONE ACETONIDE 40 MG/ML IJ SUSP
80.0000 mg | INTRAMUSCULAR | Status: AC | PRN
  Administered 2018-04-08: 80 mg via INTRA_ARTICULAR

## 2019-07-30 MED ORDER — BUPIVACAINE HCL 0.5 % IJ SOLN
3.0000 mL | INTRAMUSCULAR | Status: AC | PRN
  Administered 2018-04-08: 3 mL via INTRA_ARTICULAR

## 2019-07-31 ENCOUNTER — Other Ambulatory Visit: Payer: Self-pay | Admitting: Internal Medicine

## 2019-07-31 DIAGNOSIS — I10 Essential (primary) hypertension: Secondary | ICD-10-CM

## 2019-08-03 ENCOUNTER — Telehealth: Payer: Self-pay | Admitting: Internal Medicine

## 2019-08-03 DIAGNOSIS — K219 Gastro-esophageal reflux disease without esophagitis: Secondary | ICD-10-CM

## 2019-08-03 DIAGNOSIS — E119 Type 2 diabetes mellitus without complications: Secondary | ICD-10-CM

## 2019-08-03 MED ORDER — METFORMIN HCL 500 MG PO TABS
750.0000 mg | ORAL_TABLET | Freq: Two times a day (BID) | ORAL | 0 refills | Status: DC
Start: 2019-08-03 — End: 2019-10-28

## 2019-08-03 MED ORDER — OMEPRAZOLE 20 MG PO CPDR: DELAYED_RELEASE_CAPSULE | ORAL | 0 refills | Status: DC

## 2019-08-03 NOTE — Telephone Encounter (Signed)
Pravastatin too soon. This was sent for a 90 day supply with 1 RF in May, 2021. I have sent orders in for omeprazole and metformin.

## 2019-08-03 NOTE — Telephone Encounter (Signed)
Pt needs refill of the following meds:  Omeprazole 20 mg  Pravastatin 20 mg  metFORMIN 500 mg   Would like to review all meds since her next appt is far out.

## 2019-08-16 ENCOUNTER — Other Ambulatory Visit: Payer: Self-pay | Admitting: Internal Medicine

## 2019-08-16 ENCOUNTER — Ambulatory Visit: Payer: Medicare Other | Attending: Internal Medicine | Admitting: Internal Medicine

## 2019-08-16 ENCOUNTER — Other Ambulatory Visit: Payer: Self-pay | Admitting: Family Medicine

## 2019-08-16 ENCOUNTER — Ambulatory Visit: Payer: Self-pay | Admitting: *Deleted

## 2019-08-16 ENCOUNTER — Other Ambulatory Visit: Payer: Self-pay

## 2019-08-16 DIAGNOSIS — E669 Obesity, unspecified: Secondary | ICD-10-CM

## 2019-08-16 DIAGNOSIS — M5416 Radiculopathy, lumbar region: Secondary | ICD-10-CM

## 2019-08-16 DIAGNOSIS — E1169 Type 2 diabetes mellitus with other specified complication: Secondary | ICD-10-CM

## 2019-08-16 DIAGNOSIS — M4726 Other spondylosis with radiculopathy, lumbar region: Secondary | ICD-10-CM

## 2019-08-16 DIAGNOSIS — I10 Essential (primary) hypertension: Secondary | ICD-10-CM | POA: Diagnosis not present

## 2019-08-16 DIAGNOSIS — G8929 Other chronic pain: Secondary | ICD-10-CM

## 2019-08-16 DIAGNOSIS — E876 Hypokalemia: Secondary | ICD-10-CM

## 2019-08-16 MED ORDER — TRAMADOL HCL 50 MG PO TABS: 50.0000 mg | ORAL_TABLET | ORAL | 0 refills | Status: DC | PRN

## 2019-08-16 NOTE — Telephone Encounter (Signed)
Requested medication (s) are due for refill today: yes  Requested medication (s) are on the active medication list: yes  Last refill:  07/16/19  Future visit scheduled: yes  Notes to clinic:  medication not delegated to NT to RF   Requested Prescriptions  Pending Prescriptions Disp Refills   traMADol (ULTRAM) 50 MG tablet [Pharmacy Med Name: TRAMADOL HCL 50 MG TABLET] 120 tablet 0    Sig: Take 1 tablet (50 mg total) by mouth every 4 (four) hours as needed.      Not Delegated - Analgesics:  Opioid Agonists Failed - 08/16/2019 11:33 AM      Failed - This refill cannot be delegated      Passed - Urine Drug Screen completed in last 360 days.      Passed - Valid encounter within last 6 months    Recent Outpatient Visits           2 months ago Type 2 diabetes mellitus with obesity ([place])   [place] Ladell Pier, MD   5 months ago Appointment canceled by hospital   [place] Karle Plumber B, MD   6 months ago Type 2 diabetes mellitus without complication, without long-term current use of insulin [facility])   [place], MD   9 months ago Type 2 diabetes mellitus without complication, without long-term current use of insulin [facility])   [place], MD   1 year ago Chronic cough   [place], MD       Future Appointments             In 1 month Wyn[place] Emery Dalbert Batman, MD [place]

## 2019-08-16 NOTE — Telephone Encounter (Signed)
C/o high blood sugars in the 239 range. Patient reports she is feeling bad. C/o nausea, dizziness at times but not now, pain in back and right leg. Reports she has increased metformin per MD orders. Patient reports ultram and tylenol no longer helping with pain management. Patient wants to know if referral for pain management can be made. Care advise given. Patient verbalized understanding of  Care advise and to seek help at urgent care if symptoms worsen or blood sugar remains elevated greater than 300.   Reason for Disposition . [1] Symptoms of high blood sugar (e.g., frequent urination, weak, weight loss) AND [2] not able to test blood glucose  Answer Assessment - Initial Assessment Questions 1. BLOOD GLUCOSE: "What is your blood glucose level?"      239 prior to going to bed. 0900 - 248. 2. ONSET: "When did you check the blood glucose?"     0900- 248. 3. USUAL RANGE: "What is your glucose level usually?" (e.g., usual fasting morning value, usual evening value)     Normal range 150- 250. 4. KETONES: "Do you check for ketones (urine or blood test strips)?" If yes, ask: "What does the test show now?"      N/a home health nurse checks in June. Reported no issues 5. TYPE 1 or 2:  "Do you know what type of diabetes you have?"  (e.g., Type 1, Type 2, Gestational; doesn't know)      Type 2 6. INSULIN: "Do you take insulin?" "What type of insulin(s) do you use? What is the mode of delivery? (syringe, pen; injection or pump)?"      n/a 7. DIABETES PILLS: "Do you take any pills for your diabetes?" If yes, ask: "Have you missed taking any pills recently?"     Metformin 750mg  BID. 8. OTHER SYMPTOMS: "Do you have any symptoms?" (e.g., fever, frequent urination, difficulty breathing, dizziness, weakness, vomiting)     Dizziness, some difficulty breathing using inhaler, weak and nausea 9. PREGNANCY: "Is there any chance you are pregnant?" "When was your last menstrual period?"     n/a  Protocols used:  DIABETES - HIGH BLOOD SUGAR-A-AH

## 2019-08-16 NOTE — Progress Notes (Signed)
Pt states she checked her sugar at 415pm at it was 147. Pt states she has drunk 2 1/2 bottles of water

## 2019-08-16 NOTE — Telephone Encounter (Signed)
Requested medication (s) are due for refill today: -  Requested medication (s) are on the active medication list: no  Last refill:  03/17/19  Future visit scheduled: yes  Notes to clinic:  historic med and provider   Requested Prescriptions  Pending Prescriptions Disp Refills   gabapentin (NEURONTIN) 300 MG capsule [Pharmacy Med Name: GABAPENTIN 300 MG CAPSULE] 360 capsule     Sig: TAKE 2 CAPSULES (600 MG) BY MOUTH 2 TIMES DAILY.      Neurology: Anticonvulsants - gabapentin Passed - 08/16/2019 11:34 AM      Passed - Valid encounter within last 12 months    Recent Outpatient Visits           2 months ago Type 2 diabetes mellitus with obesity ([place])   [place] Ladell Pier, MD   5 months ago Appointment canceled by hospital   [place] Ladell Pier, MD   6 months ago Type 2 diabetes mellitus without complication, without long-term current use of insulin ([place])   [place] Ladell Pier, MD   9 months ago Type 2 diabetes mellitus without complication, without long-term current use of insulin ([place])   [place] Ladell Pier, MD   1 year ago Chronic cough   [place], MD       Future Appointments             In 1 month Ladell Pier, MD [place]             Signed Prescriptions Disp Refills   potassium chloride (KLOR-CON) 10 MEQ tablet 90 tablet 0    Sig: TAKE 1 TABLET BY [place] DAY      Endocrinology:  Minerals - Potassium Supplementation Failed - 08/16/2019 11:34 AM      Failed - Cr in normal range and within 360 days    Creatinine  Date Value Ref Range Status  11/02/2018 129.8 20.0 - 300.0 mg/dL Final  11/08/2016 1.2 (H) 0.6 - 1.1 mg/dL Final   Creatinine, Ser  Date Value Ref Range Status  03/17/2019 1.07 (H) 0.44 - 1.00  mg/dL Final   Creatinine, POC  Date Value Ref Range Status  07/02/2016 100mg  mg/dL Final          Passed - K in normal range and within 360 days    Potassium  Date Value Ref Range Status  03/17/2019 3.7 3.5 - 5.1 mmol/L Final  11/08/2016 3.0 (LL) 3.5 - 5.1 mEq/L Final          Passed - Valid encounter within last 12 months    Recent Outpatient Visits           2 months ago Type 2 diabetes mellitus with obesity ([place])   [place] Ladell Pier, MD   5 months ago Appointment canceled by hospital   [place], Deborah B, MD   6 months ago Type 2 diabetes mellitus without complication, without long-term current use of insulin [facility])   [place] Karle Plumber B, MD   9 months ago Type 2 diabetes mellitus without complication, without long-term current use of insulin [facility])   [place] Ladell Pier, MD   1 year ago Chronic cough   [place], MD       Future Appointments             In 1 month Wynetta Emery Dalbert Batman, MD [place]

## 2019-08-16 NOTE — Telephone Encounter (Signed)
Pt was given a virtual visit with pcp 7/12

## 2019-08-16 NOTE — Telephone Encounter (Signed)
Marland Kitchen Requested Prescriptions  Pending Prescriptions Disp Refills  . potassium chloride (KLOR-CON) 10 MEQ tablet [Pharmacy Med Name: POTASSIUM CL ER 10 MEQ TABLET] 90 tablet 0    Sig: TAKE 1 TABLET BY [place] DAY     Endocrinology:  Minerals - Potassium Supplementation Failed - 08/16/2019 11:34 AM      Failed - Cr in normal range and within 360 days    Creatinine  Date Value Ref Range Status  11/02/2018 129.8 20.0 - 300.0 mg/dL Final  11/08/2016 1.2 (H) 0.6 - 1.1 mg/dL Final   Creatinine, Ser  Date Value Ref Range Status  03/17/2019 1.07 (H) 0.44 - 1.00 mg/dL Final   Creatinine, POC  Date Value Ref Range Status  07/02/2016 100mg  mg/dL Final         Passed - K in normal range and within 360 days    Potassium  Date Value Ref Range Status  03/17/2019 3.7 3.5 - 5.1 mmol/L Final  11/08/2016 3.0 (LL) 3.5 - 5.1 mEq/L Final         Passed - Valid encounter within last 12 months    Recent Outpatient Visits          2 months ago Type 2 diabetes mellitus with obesity ([place])   [place] Ladell Pier, MD   5 months ago Appointment canceled by hospital   [place] Karle Plumber B, MD   6 months ago Type 2 diabetes mellitus without complication, without long-term current use of insulin [facility])   [place] Karle Plumber B, MD   9 months ago Type 2 diabetes mellitus without complication, without long-term current use of insulin [facility])   [place] Ladell Pier, MD   1 year ago Chronic cough   [place], MD      Future Appointments            In 1 month Wynetta Emery Dalbert Batman, MD [place]           . gabapentin (NEURONTIN) 300 MG capsule [Pharmacy Med Name: GABAPENTIN 300 MG CAPSULE] 360 capsule     Sig: TAKE 2 CAPSULES (600 MG) BY MOUTH 2 TIMES DAILY.      Neurology: Anticonvulsants - gabapentin Passed - 08/16/2019 11:34 AM      Passed - Valid encounter within last 12 months    Recent Outpatient Visits          2 months ago Type 2 diabetes mellitus with obesity ([place])   [place] Ladell Pier, MD   5 months ago Appointment canceled by hospital   [place] Karle Plumber B, MD   6 months ago Type 2 diabetes mellitus without complication, without long-term current use of insulin [facility])   [place], MD   9 months ago Type 2 diabetes mellitus without complication, without long-term current use of insulin [facility])   [place], MD   1 year ago Chronic cough   [place], MD      Future Appointments            In 1 month Wynetta Emery Dalbert Batman, MD [place]

## 2019-08-16 NOTE — Progress Notes (Signed)
Virtual Visit via Telephone Note Due to current restrictions/limitations of in-office visits due to the COVID-19 pandemic, this scheduled clinical appointment was converted to a telehealth visit  I connected with Rachel Herman on 08/16/19 at 4:55 p.m by telephone and verified that I am speaking with the correct person using two identifiers. I am in my office.  The patient is at home.  Only the patient and myself participated in this encounter.  I discussed the limitations, risks, security and privacy concerns of performing an evaluation and management service by telephone and the availability of in person appointments. I also discussed with the patient that there may be a patient responsible charge related to this service. The patient expressed understanding and agreed to proceed.   History of Present Illness: Pt with hx of HTN, DM, HL, DJD lumbar and spinal stenosiswith hx of laminectomy 2016, obesity, depression,DuctalCA LT s/plumpectomy/XRT (2015 and 2016, followed by Dr. Annamaria Boots), vaginal prolapse, CKD 3.  Today's visit is urgent care for blood sugar concerns.  Pt reports BS running in the 140s in the past 2 wks. Checking BS 3 x a day sometimes before and after meals.  BF meals in the 140s and one hr after meals 150s.  She feels this is too high for her and may be causing her to feel tired and have headaches.  Her eating habits have not changed.  She also reports that her blood pressure recently has been running in the 160s over 90s.  She ran out of carvedilol around the 26th of last month.  I had sent in a refill but she states the pharmacy never told her that the refill was there. -Is been having some headaches with nausea.  Wants referral to Dr. Marlou Sa for her back pain.  She states that she sees them intermittently to have injections done.  She is needing refill on tramadol..   Outpatient Encounter Medications as of 08/16/2019  Medication Sig  . acetaminophen (TYLENOL) 500 MG tablet  Take 1,000 mg by mouth every 6 (six) hours as needed for headache (pain).  Marland Kitchen albuterol (VENTOLIN HFA) 108 (90 Base) MCG/ACT inhaler Inhale 2 puffs into the lungs every 6 (six) hours as needed for wheezing or shortness of breath.  . anastrozole (ARIMIDEX) 1 MG tablet TAKE 1 TABLET BY MOUTH EVERY DAY  . Ascorbic Acid (VITAMIN C) 1000 MG tablet Take 1,000 mg by mouth daily.  Marland Kitchen aspirin EC 325 MG tablet Take 325 mg by mouth daily.  . Blood Glucose Monitoring Suppl (ONE TOUCH ULTRA MINI) w/Device KIT USE AS DIRECTED ONCE DAILY DX CODE E11.9  . carvedilol (COREG) 3.125 MG tablet TAKE 1 TABLET BY MOUTH 2 TIMES DAILY WITH A MEAL.  . cloNIDine (CATAPRES) 0.1 MG tablet Take 1 tablet (0.1 mg total) by mouth 2 (two) times daily.  . [employer] SENNA PLUS 8.6-50 MG tablet TAKE 2 TABLETS BY MOUTH AT BEDTIME AS NEEDED FOR MILD CONSTIPATION (Patient taking differently: Take 1 tablet by mouth at bedtime. )  . gabapentin (NEURONTIN) 300 MG capsule Take 2 capsules by mouth as directed.  . hydrochlorothiazide (HYDRODIURIL) 25 MG tablet TAKE 1 TABLET BY MOUTH EVERYDAY AT BEDTIME  . metFORMIN (GLUCOPHAGE) 500 MG tablet Take 1.5 tablets (750 mg total) by mouth 2 (two) times daily.  . Multiple Vitamins-Minerals (MULTIVITAMIN WITH MINERALS) tablet Take 1 tablet by mouth daily. Reported on 03/13/2015  . omeprazole (PRILOSEC) 20 MG capsule TAKE 1 CAPSULE BY MOUTH EVERY DAY  . ONETOUCH DELICA LANCETS 63S MISC USE  AS DIRECTED ONCE DAILY DX CODE E11.9  . ONETOUCH ULTRA test strip TEST ONCE DAILY AS DIRECTED E11.9  . potassium chloride (KLOR-CON) 10 MEQ tablet TAKE 1 TABLET BY MOUTH EVERY DAY  . pravastatin (PRAVACHOL) 20 MG tablet TAKE 1 TABLET BY MOUTH EVERY DAY  . tizanidine (ZANAFLEX) 2 MG capsule Take 2 mg by mouth every 6 (six) hours as needed for muscle spasms. Take 2 tablets every 6 hours prn  . traMADol (ULTRAM) 50 MG tablet Take 1 tablet (50 mg total) by mouth every 4 (four) hours as needed.   No facility-administered  encounter medications on file as of 08/16/2019.      Observations/Objective: No direct observation done as this was a telephone encounter.  Assessment and Plan: 1. Type 2 diabetes mellitus with obesity (HCC) Advised patient that current blood sugar readings are not bad.  I think if we add another oral medication blood sugars may start bottoming out.  Blood sugars 2 hours after meals should be less than 180.  Our goal before meals is between 90-130 and she is just running slightly above that.  I recommend continue current dose of Metformin and healthy eating habits.  2. Essential hypertension Headaches may be due to elevated blood pressure.  She plans to pick up the refill on carvedilol today or tomorrow.  She will let me know if the headache does not abate  3. Chronic radicular pain of lower back I have submitted the referral to Dr. Dean per her request - traMADol (ULTRAM) 50 MG tablet; Take 1 tablet (50 mg total) by mouth every 4 (four) hours as needed.  Dispense: 120 tablet; Refill: 0   Follow Up Instructions: Keep your follow-up visit as previously scheduled.   I discussed the assessment and treatment plan with the patient. The patient was provided an opportunity to ask questions and all were answered. The patient agreed with the plan and demonstrated an understanding of the instructions.   The patient was advised to call back or seek an in-person evaluation if the symptoms worsen or if the condition fails to improve as anticipated.  I provided 10 minutes of non-face-to-face time during this encounter.   Deborah Johnson, MD   

## 2019-08-25 ENCOUNTER — Encounter: Payer: Self-pay | Admitting: Orthopedic Surgery

## 2019-08-25 ENCOUNTER — Ambulatory Visit: Payer: Medicare Other | Admitting: Orthopedic Surgery

## 2019-08-25 ENCOUNTER — Ambulatory Visit: Payer: Self-pay

## 2019-08-25 DIAGNOSIS — M25551 Pain in right hip: Secondary | ICD-10-CM

## 2019-08-25 DIAGNOSIS — M79605 Pain in left leg: Secondary | ICD-10-CM | POA: Diagnosis not present

## 2019-08-25 DIAGNOSIS — M79604 Pain in right leg: Secondary | ICD-10-CM | POA: Diagnosis not present

## 2019-08-25 NOTE — Progress Notes (Signed)
Office Visit Note   Patient: Rachel Herman           Date of Birth: Jan 22, 1951           MRN: [id number] Visit Date: 08/25/2019 Requested by: Ladell Pier, MD [place],  [place] [address] PCP: Ladell Pier, MD  Subjective: Chief Complaint  Patient presents with  . Lower Back - Pain  . Right Leg - Pain  . Left Leg - Pain    HPI: Rachel Herman is a 69 year old patient with mild back pain and bilateral leg pain right worse than left.  Reports some radiating pain down the front of the thigh.  The pain does wake her from sleep at night.  She also reports groin pain on the right less on the left.  Also describes some buttock pain.  She is using Ultram and Tylenol.  She does have a history of breast cancer.  She had a bone scan in February which by her report was normal.  She had MRI of the lumbar spine in 2020 also which showed only mild disc bulging and no compressive nerve pathologies.  Denies any recent fevers or chills.  Does have a lot of pain with ambulation.  She has had injections in the past with Dr. Ernestina Patches for her right hip and they have been successful.  Last 1 was over 2 years ago.              ROS: All systems reviewed are negative as they relate to the chief complaint within the history of present illness.  Patient denies  fevers or chills.   Assessment & Plan: Visit Diagnoses:  1. Pain in both lower extremities   2. Pain in right hip     Plan: Impression is symptomatic right hip arthritis.  Less likely that this is coming from her back based on her exam.  Recommend repeat intra-articular fluoroscopically guided right hip injection with Dr. Ernestina Patches.  No further work-up really indicated unless her symptoms progress or become more radicular in nature.  She may potentially have to consider hip replacement in the future but based on her activity level and symptoms I think she is not really in the ballpark for that yet.  Follow-Up Instructions: No follow-ups  on file.   Orders:  Orders Placed This Encounter  Procedures  . XR Lumbar Spine 2-3 Views  . XR HIPS BILAT W OR W/O PELVIS MIN 5 VIEWS  . Ambulatory referral to Physical Medicine Rehab   No orders of the defined types were placed in this encounter.     Procedures: No procedures performed   Clinical Data: No additional findings.  Objective: Vital Signs: There were no vitals taken for this visit.  Physical Exam:   Constitutional: Patient appears well-developed HEENT:  Head: Normocephalic Eyes:EOM are normal Neck: Normal range of motion Cardiovascular: Normal rate Pulmonary/chest: Effort normal Neurologic: Patient is alert Skin: Skin is warm Psychiatric: Patient has normal mood and affect    Ortho Exam: Ortho exam demonstrates Trendelenburg gait to the right.  She does have groin pain with internal extra rotation of the right hip and not the left.  No nerve root tension signs.  Palpable pedal pulses.  Ankle dorsiflexion plantarflexion quad hamstring strength 5+ out of 5.  She has mild pain with forward lateral bending.  No definite paresthesias L1 S1 bilaterally.  Reflexes symmetric 0 1+ out of 4 bilateral patella and Achilles.  Specialty Comments:  No specialty comments  available.  Imaging: XR HIPS BILAT W OR W/O PELVIS MIN 5 VIEWS  Result Date: 08/25/2019 AP pelvis lateral bilateral hips reviewed.  Moderate arthritis present in the right hip with inferior femoral head spurring.  Mild to moderate arthritis present in the left hip.  No acute fracture.  No other bony pelvic abnormality.  XR Lumbar Spine 2-3 Views  Result Date: 08/25/2019 AP lateral lumbar spine reviewed.  Moderate arthritis in the right hip is noted.  Mild to moderate arthritis in the left hip present.  No spondylolisthesis or compression fractures.  Disc spaces maintained.  Moderate facet arthritis present in the lower lumbar spine.  No scoliosis.    PMFS History: Patient Active Problem List    Diagnosis Date Noted  . Chronic cough 08/03/2018  . Other intervertebral disc degeneration, lumbar region 03/18/2018  . 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  . 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 Lumbar pain with radiation down left and right leg 07/02/2016  . Thyroid nodule 06/09/2015  . Nodule of right lung 07/28/2014  . Diverticulosis of colon without hemorrhage 07/28/2014  . DJD (degenerative joint disease), lumbar 02/22/2014  . Lumbar pain with radiation down left leg 01/17/2014  . Breast cancer of upper-inner quadrant of left female breast ([facility]) 01/05/2014  . Hyperlipidemia associated with type 2 diabetes mellitus ([facility]) 09/28/2013  . S/P total hysterectomy and bilateral salpingo-oophorectomy 09/20/2013  . Essential hypertension   . Diabetes mellitus ([facility]) 09/16/2013   Past Medical History:  Diagnosis Date  . Breast cancer ([facility]) 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  . Diabetes mellitus ([facility]) 09/16/13   Diagnosed on 09/16/13; HgA1C was 7.2/metformin  . Dizziness   . GERD (gastroesophageal reflux disease)   . Headache   . Hyperlipidemia   . Hypertension 2014  . Low back pain   . Obesity, morbid, BMI 40.0-49.9 ([facility])   . PONV (postoperative nausea and vomiting)   . S/P radiation therapy 04/05/14-05/20/14   left breast 60.4Gy totaldose    Family History  Problem Relation Age of Onset  . Diabetes Mother   . Multiple myeloma Mother 17  . Hearing loss Mother   . Diabetes Brother   . Cancer Brother 40       prostate cancer   . Diabetes Maternal Aunt   . Leukemia Maternal Aunt        dx in her 33s  . Alcoholism Brother   . Heart attack Father   . Diabetes Sister   . Diabetes Son     Past Surgical History:  Procedure Laterality Date  . ABDOMINAL HYSTERECTOMY N/A 09/20/2013   Procedure: HYSTERECTOMY ABDOMINAL;  Surgeon: Osborne Oman, MD;  Location: [facility];  Service: Gynecology;  Laterality: N/A;  . BREAST LUMPECTOMY Left 2015    radiation  . BREAST LUMPECTOMY WITH AXILLARY LYMPH NODE BIOPSY Left 12/29/13   left breast   . LAPAROSCOPIC ASSISTED VAGINAL HYSTERECTOMY  09/20/2013   Procedure: LAPAROSCOPIC ASSISTED VAGINAL HYSTERECTOMY;  Surgeon: Osborne Oman, MD;  Location: [facility];  Service: Gynecology;;  PT WAS EXAMINED WHILE UP IN [place] AND IT WAS DECIDED TO OPEN PT DUE TO LARGE MASS  . LUMBAR LAMINECTOMY/DECOMPRESSION MICRODISCECTOMY Right 12/14/2014   Procedure: Right Lumbar three-four microdiskectomy;  Surgeon: Newman Pies, MD;  Location: [facility];  Service: Neurosurgery;  Laterality: Right;  Right Lumbar three-four microdiskectomy  . RADIOACTIVE SEED GUIDED PARTIAL MASTECTOMY WITH AXILLARY SENTINEL LYMPH NODE BIOPSY Left 12/29/2013   Procedure: LEFT BREAST RADIOACTIVE  SEED LOCALIZED LUMPECTOMY WITH SENTINEL LYMPH NODE MAPPING;  Surgeon: Erroll Luna, MD;  Location: [facility];  Service: General;  Laterality: Left;  . RE-EXCISION OF BREAST LUMPECTOMY Left 03/02/2014   Procedure: RE-EXCISION OF LEFT BREAST LUMPECTOMY;  Surgeon: Erroll Luna, MD;  Location: [facility];  Service: General;  Laterality: Left;  . SALPINGOOPHORECTOMY  09/20/2013   Procedure: SALPINGO OOPHORECTOMY;  Surgeon: Osborne Oman, MD;  Location: [facility];  Service: Gynecology;;   Social History   Occupational History    Employer: UNEMPLOYED  Tobacco Use  . Smoking status: Never Smoker  . Smokeless tobacco: Never Used  Vaping Use  . Vaping Use: Never used  Substance and Sexual Activity  . Alcohol use: No  . Drug use: No  . Sexual activity: Not Currently    Birth control/protection: Post-menopausal

## 2019-08-30 ENCOUNTER — Ambulatory Visit: Payer: Self-pay

## 2019-08-30 ENCOUNTER — Ambulatory Visit (INDEPENDENT_AMBULATORY_CARE_PROVIDER_SITE_OTHER): Payer: Medicare Other | Admitting: Physical Medicine and Rehabilitation

## 2019-08-30 ENCOUNTER — Encounter: Payer: Self-pay | Admitting: Physical Medicine and Rehabilitation

## 2019-08-30 ENCOUNTER — Other Ambulatory Visit: Payer: Self-pay

## 2019-08-30 VITALS — BP 136/81 | HR 80

## 2019-08-30 DIAGNOSIS — M25551 Pain in right hip: Secondary | ICD-10-CM

## 2019-08-30 NOTE — Progress Notes (Signed)
   Rachel Herman - 69 y.o. female MRN [id number]  Date of birth: [birth year]  Office Visit Note: Visit Date: 08/30/2019 PCP: Ladell Pier, MD Referred by: Ladell Pier, MD  Subjective: Chief Complaint  Patient presents with  . Right Hip - Pain  . Right Leg - Pain   HPI:  Rachel Herman is a 69 y.o. female who comes in today at the request of Dr. Anderson Malta for planned Right anesthetic hip arthrogram with fluoroscopic guidance.  The patient has failed conservative care including home exercise, medications, time and activity modification.  This injection will be diagnostic and hopefully therapeutic.  Please see requesting physician notes for further details and justification.  Last injection was March 2020 and she is done well without injection for many months and has had a few months now of worsening hip pain once again.  ROS Otherwise per HPI.  Assessment & Plan: Visit Diagnoses:  1. Pain in right hip     Plan: No additional findings.   Meds & Orders: No orders of the defined types were placed in this encounter.   Orders Placed This Encounter  Procedures  . Large Joint Inj: R hip joint  . XR C-ARM NO REPORT    Follow-up: Return for visit to requesting physician as needed.   Procedures: Large Joint Inj: R hip joint on 08/30/2019 10:59 AM Indications: diagnostic evaluation and pain Details: 22 G 3.5 in needle, fluoroscopy-guided anterior approach  Arthrogram: No  Medications: 4 mL bupivacaine 0.25 %; 60 mg triamcinolone acetonide 40 MG/ML Outcome: tolerated well, no immediate complications  There was excellent flow of contrast producing a partial arthrogram of the hip. The patient did have relief of symptoms during the anesthetic phase of the injection. Procedure, treatment alternatives, risks and benefits explained, specific risks discussed. Consent was given by the patient. Immediately prior to procedure a time out was called to verify the correct  patient, procedure, equipment, support staff and site/side marked as required. Patient was prepped and draped in the usual sterile fashion.      No notes on file   Clinical History: 08/25/2019 Hip xray: AP pelvis lateral bilateral hips reviewed. 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 Mild to moderate  arthritis present in the left hip. No acute fracture. No other bony  pelvic abnormality.     Objective:  VS:  HT:    WT:   BMI:     BP:(!) 136/81  HR:80bpm  TEMP: ( )  RESP:  Physical Exam   Imaging: XR C-ARM NO REPORT  Result Date: 08/30/2019 Please see Notes tab for imaging impression.

## 2019-08-30 NOTE — Progress Notes (Signed)
Pt states right hip and leg pain. Pt states pain is always there. Pt states laying down for a little makes the pain better. Pt has hx of hip inj on 04/08/18 pt states it was working good for nine months.  Numeric Pain Rating Scale and Functional Assessment Average Pain 9   In the last MONTH (on 0-10 scale) has pain interfered with the following?  1. General activity like being  able to carry out your everyday physical activities such as walking, climbing stairs, carrying groceries, or moving a chair?  Rating(10)   +Driver, -BT, -Dye Allergies.

## 2019-08-31 ENCOUNTER — Encounter: Payer: Self-pay | Admitting: Physical Medicine and Rehabilitation

## 2019-08-31 MED ORDER — TRIAMCINOLONE ACETONIDE 40 MG/ML IJ SUSP
60.0000 mg | INTRAMUSCULAR | Status: AC | PRN
  Administered 2019-08-30: 60 mg via INTRA_ARTICULAR

## 2019-08-31 MED ORDER — BUPIVACAINE HCL 0.25 % IJ SOLN
4.0000 mL | INTRAMUSCULAR | Status: AC | PRN
  Administered 2019-08-30: 4 mL via INTRA_ARTICULAR

## 2019-09-13 DIAGNOSIS — E119 Type 2 diabetes mellitus without complications: Secondary | ICD-10-CM | POA: Diagnosis not present

## 2019-09-13 DIAGNOSIS — H40013 Open angle with borderline findings, low risk, bilateral: Secondary | ICD-10-CM | POA: Diagnosis not present

## 2019-09-13 DIAGNOSIS — H2513 Age-related nuclear cataract, bilateral: Secondary | ICD-10-CM | POA: Diagnosis not present

## 2019-09-14 ENCOUNTER — Ambulatory Visit: Payer: Medicare Other | Attending: Family | Admitting: Family

## 2019-09-14 ENCOUNTER — Other Ambulatory Visit: Payer: Self-pay

## 2019-09-14 ENCOUNTER — Encounter: Payer: Self-pay | Admitting: Family

## 2019-09-14 VITALS — BP 115/79 | HR 74 | Resp 16 | Ht 65.0 in | Wt 223.2 lb

## 2019-09-14 DIAGNOSIS — Z0000 Encounter for general adult medical examination without abnormal findings: Secondary | ICD-10-CM | POA: Diagnosis not present

## 2019-09-14 DIAGNOSIS — Z2821 Immunization not carried out because of patient refusal: Secondary | ICD-10-CM

## 2019-09-14 NOTE — Patient Instructions (Addendum)
  Rachel Herman , Thank you for taking time to come for your Medicare Wellness Visit. I appreciate your ongoing commitment to your health goals. Please review the following plan we discussed and let me know if I can assist you in the future.   [place] review information regarding Engineer, technical sales.  Patient given written packet also. She will look over it and consider executing a living will.  If she does, I requested that she brings a copy of it for our records.  These are the goals we discussed: Goals    . Blood Pressure < 140/90    . HEMOGLOBIN A1C < 7.0    . Weight < 200 lb (90.719 kg)       This is a list of the screening recommended for you and due dates:  Health Maintenance  Topic Date Due  . Pneumonia vaccines (1 of 2 - PCV13) Never done  . Flu Shot  09/05/2019  . Eye exam for diabetics  10/02/2019  . Hemoglobin A1C  12/09/2019  . Complete foot exam   06/07/2020  . Mammogram  03/07/2021  . Colon Cancer Screening  09/14/2021  . Tetanus Vaccine  03/30/2024  . DEXA scan (bone density measurement)  Completed  . COVID-19 Vaccine  Completed  .  Hepatitis C: One time screening is recommended by Center for Disease Control  (CDC) for  adults born from [birth year] through 1966.   Completed  . Stool Blood Test  Discontinued

## 2019-09-14 NOTE — Progress Notes (Signed)
Subjective:   Rachel Herman is a 69 y.o. female who presents for an Initial Medicare Annual Wellness Visit.  Review of Systems     Objective:    Today's Vitals   09/14/19 0855 09/14/19 0859  BP: 115/79   Pulse: 74   Resp: 16   SpO2: 96%   Weight: 223 lb 3.2 oz (101.2 kg)   Height: _0  (1.651 m)   PainSc:  9    Body mass index is 37.14 kg/m.  Advanced Directives 09/14/2019 03/17/2019 10/19/2018 03/15/2018 11/08/2016 11/01/2016 08/29/2016  Does Patient Have a Medical Advance Directive? _1  No No  Type of Advance Directive - - - - - - -  Does patient want to make changes to medical advance directive? - - - - - - -  Would patient like information on creating a medical advance directive? Yes (Inpatient - patient defers creating a medical advance directive at this time - Information given) - No - Patient declined No - Patient declined - Yes (MAU/Ambulatory/Procedural Areas - Information given) -   Discussed with patient what is a Soil scientist.  I went over her living will and [place].  Patient given written packet also. She will look over it and consider executing a living will.  If she does, I requested that she brings a copy of it for our records. Patient verbalized understanding.  Current Medications (verified) Outpatient Encounter Medications as of 09/14/2019  Medication Sig  . acetaminophen (TYLENOL) 500 MG tablet Take 1,000 mg by mouth every 6 (six) hours as needed for headache (pain).  Marland Kitchen albuterol (VENTOLIN HFA) 108 (90 Base) MCG/ACT inhaler Inhale 2 puffs into the lungs every 6 (six) hours as needed for wheezing or shortness of breath.  . anastrozole (ARIMIDEX) 1 MG tablet TAKE 1 TABLET BY MOUTH EVERY DAY  . Ascorbic Acid (VITAMIN C) 1000 MG tablet Take 1,000 mg by mouth daily.  Marland Kitchen aspirin EC 325 MG tablet Take 325 mg by mouth daily.  . Blood Glucose Monitoring Suppl (ONE TOUCH ULTRA MINI) w/Device KIT USE AS DIRECTED ONCE DAILY  DX CODE E11.9  . carvedilol (COREG) 3.125 MG tablet TAKE 1 TABLET BY MOUTH 2 TIMES DAILY WITH A MEAL.  . cloNIDine (CATAPRES) 0.1 MG tablet Take 1 tablet (0.1 mg total) by mouth 2 (two) times daily.  . [facility] SENNA PLUS 8.6-50 MG tablet TAKE 2 TABLETS BY MOUTH AT BEDTIME AS NEEDED FOR MILD CONSTIPATION (Patient taking differently: Take 1 tablet by mouth at bedtime. )  . gabapentin (NEURONTIN) 300 MG capsule TAKE 2 CAPSULES (600 MG) BY MOUTH 2 TIMES DAILY.  . hydrochlorothiazide (HYDRODIURIL) 25 MG tablet TAKE 1 TABLET BY MOUTH EVERYDAY AT BEDTIME  . metFORMIN (GLUCOPHAGE) 500 MG tablet Take 1.5 tablets (750 mg total) by mouth 2 (two) times daily.  . Multiple Vitamins-Minerals (MULTIVITAMIN WITH MINERALS) tablet Take 1 tablet by mouth daily. Reported on 03/13/2015  . omeprazole (PRILOSEC) 20 MG capsule TAKE 1 CAPSULE BY MOUTH EVERY DAY  . ONETOUCH DELICA LANCETS 70V MISC USE AS DIRECTED ONCE DAILY DX CODE E11.9  . ONETOUCH ULTRA test strip TEST ONCE DAILY AS DIRECTED E11.9  . potassium chloride (KLOR-CON) 10 MEQ tablet TAKE 1 TABLET BY MOUTH EVERY DAY  . pravastatin (PRAVACHOL) 20 MG tablet TAKE 1 TABLET BY MOUTH EVERY DAY  . tizanidine (ZANAFLEX) 2 MG capsule Take 2 mg by mouth every 6 (six) hours as needed for muscle spasms. Take 2 tablets  every 6 hours prn  . traMADol (ULTRAM) 50 MG tablet Take 1 tablet (50 mg total) by mouth every 4 (four) hours as needed.   No facility-administered encounter medications on file as of 09/14/2019.    Allergies (verified) Patient has no known allergies.   History: Past Medical History:  Diagnosis Date  . Breast cancer ([place]) 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  . Diabetes mellitus ([place]) 09/16/13   Diagnosed on 09/16/13; HgA1C was 7.2/metformin  . Dizziness   . GERD (gastroesophageal reflux disease)   . Headache   . Hyperlipidemia   . Hypertension 2014  . Low back pain   . Obesity, morbid, BMI 40.0-49.9 ([place])   . PONV (postoperative nausea and  vomiting)   . S/P radiation therapy 04/05/14-05/20/14   left breast 60.4Gy totaldose   Past Surgical History:  Procedure Laterality Date  . ABDOMINAL HYSTERECTOMY N/A 09/20/2013   Procedure: HYSTERECTOMY ABDOMINAL;  Surgeon: Osborne Oman, MD;  Location: [facility];  Service: Gynecology;  Laterality: N/A;  . BREAST LUMPECTOMY Left 2015   radiation  . BREAST LUMPECTOMY WITH AXILLARY LYMPH NODE BIOPSY Left 12/29/13   left breast   . LAPAROSCOPIC ASSISTED VAGINAL HYSTERECTOMY  09/20/2013   Procedure: LAPAROSCOPIC ASSISTED VAGINAL HYSTERECTOMY;  Surgeon: Osborne Oman, MD;  Location: [facility];  Service: Gynecology;;  PT WAS EXAMINED WHILE UP IN [place] AND IT WAS DECIDED TO OPEN PT DUE TO LARGE MASS  . LUMBAR LAMINECTOMY/DECOMPRESSION MICRODISCECTOMY Right 12/14/2014   Procedure: Right Lumbar three-four microdiskectomy;  Surgeon: Newman Pies, MD;  Location: [facility];  Service: Neurosurgery;  Laterality: Right;  Right Lumbar three-four microdiskectomy  . RADIOACTIVE SEED GUIDED PARTIAL MASTECTOMY WITH AXILLARY SENTINEL LYMPH NODE BIOPSY Left 12/29/2013   Procedure: LEFT BREAST RADIOACTIVE SEED LOCALIZED LUMPECTOMY WITH SENTINEL LYMPH NODE MAPPING;  Surgeon: Erroll Luna, MD;  Location: [facility];  Service: General;  Laterality: Left;  . RE-EXCISION OF BREAST LUMPECTOMY Left 03/02/2014   Procedure: RE-EXCISION OF LEFT BREAST LUMPECTOMY;  Surgeon: Erroll Luna, MD;  Location: [facility];  Service: General;  Laterality: Left;  . SALPINGOOPHORECTOMY  09/20/2013   Procedure: SALPINGO OOPHORECTOMY;  Surgeon: Osborne Oman, MD;  Location: [facility];  Service: Gynecology;;   Family History  Problem Relation Age of Onset  . Diabetes Mother   . Multiple myeloma Mother 24  . Hearing loss Mother   . Diabetes Brother   . Cancer Brother 40       prostate cancer   . Diabetes Maternal Aunt   . Leukemia Maternal Aunt        dx in her 57s  . Alcoholism Brother   .  Heart attack Father   . Diabetes Sister   . Diabetes Son    Social History   Socioeconomic History  . Marital status: Married    Spouse name: Habiba Treloar   . Number of children: 3  . Years of education: 64  . Highest education level: Not on file  Occupational History    Employer: UNEMPLOYED  Tobacco Use  . Smoking status: Never Smoker  . Smokeless tobacco: Never Used  Vaping Use  . Vaping Use: Never used  Substance and Sexual Activity  . Alcohol use: No  . Drug use: No  . Sexual activity: Not Currently    Birth control/protection: Post-menopausal  Other Topics Concern  . Not on file  Social History Narrative   Married to [employer] in 1986.    Has  3 children from previous marriage, 1 in [place], 1 in [place], 1 in prison ([place]).    Lives with husband.    Right-handed.   1 cup caffeine per day.   Social Determinants of Health   Financial Resource Strain:   . Difficulty of Paying Living Expenses:   Food Insecurity:   . Worried About Charity fundraiser in the Last Year:   . Arboriculturist in the Last Year:   Transportation Needs:   . Film/video editor (Medical):   Marland Kitchen Lack of Transportation (Non-Medical):   Physical Activity:   . Days of Exercise per Week:   . Minutes of Exercise per Session:   Stress:   . Feeling of Stress :   Social Connections:   . Frequency of Communication with Friends and Family:   . Frequency of Social Gatherings with Friends and Family:   . Attends Religious Services:   . Active Member of Clubs or Organizations:   . Attends Archivist Meetings:   Marland Kitchen Marital Status:     Tobacco Counseling Patient reports she has never smoked.  Clinical Intake: Pain : 0-10 Pain Score: 9  Pain Type: Chronic pain Pain Location: Back Pain Orientation: Lower  Diabetes: Yes CBG done?: No  Diabetic? Yes. Last visit 08/16/2019 with her primary physician Dr. Wynetta Emery. Patient to continue plan as prescribed and follow-up as  scheduled.  Activities of Daily Living In your present state of health, do you have any difficulty performing the following activities: 09/14/2019  Hearing? N  Vision? N  Difficulty concentrating or making decisions? Y  Walking or climbing stairs? N  Dressing or bathing? N  Doing errands, shopping? N  Preparing Food and eating ? N  Using the Toilet? N  In the past six months, have you accidently leaked urine? N  Do you have problems with loss of bowel control? N  Managing your Medications? N  Managing your Finances? N  Housekeeping or managing your Housekeeping? N  Some recent data might be hidden    Patient reports she does have some difficulty concentrating and making decisions. Patient cannot explain what the difficulty is related to. Reports sometimes her husband disagrees with her decisions and tells her it may be best to do it another way.  Patient Care Team: Ladell Pier, MD as PCP - General (Internal Medicine) Boykin Nearing, MD as Consulting Physician (Family Medicine) Erroll Luna, MD as Consulting Physician (General Surgery) Truitt Merle, MD as Consulting Physician (Hematology) Kyung Rudd, MD as Consulting Physician (Radiation Oncology) Marcene Duos, MD as Consulting Physician (Orthopedics)  Indicate any recent Medical Services you may have received from other than [facility] providers in the past year (date may be approximate). Denies     Assessment:   This is a routine wellness examination for Vannie.  Hearing/Vision screen  Hearing Screening   _0  _1  _2  _3  _4  _5  _6  _7  _8   Right ear:           Left ear:             Visual Acuity Screening   Right eye Left eye Both eyes  Without correction: 20 50 20 40 20 30  With correction:      Last eye examination on 09/13/2019. Reports she was told she has bilateral cataracts. Reports she is scheduled to have right eye cataract removed on 10/08/2019. Reports she will have left eye cataract  removed at a later date. Denies use of corrective lenses and/or  contact lenses.  Whisper Test is normal bilateral.  Dietary issues and exercise activities discussed:  Reports a typical breakfast consists of grits, bacon, and oatmeal.  Reports typical lunch consists of sandwich, crackers, and chips.   Reports typical dinner is large and consists of stew beef, fried okra, green beans, and corn.  Reports she does have pound cakes, cheesecake, oatmeal cookies, raisin cookies sometimes.  Reports her favorite beverages are Mountain dew and sweet tea. States she tried diet soda, unsweet tea, and water and that she does not like those at all. Denies alcohol   Reports she attempts to walk for exercise but lower back and legs hurt so she is unable to do much.   Goals    . Blood Pressure < 140/90    . HEMOGLOBIN A1C < 7.0    . Weight < 200 lb (90.719 kg)      Patient states "I would like to lose 30 to 40 pounds within the next year."  Depression Screen PHQ 2/9 Scores 09/14/2019 08/03/2018 04/23/2018 03/18/2018 01/22/2018 10/23/2017 07/01/2017  PHQ - 2 Score 0 0 0 3 0 0 1  PHQ- 9 Score - _0 - 3 -      Fall Risk Fall Risk  09/14/2019 03/04/2019 08/03/2018 03/18/2018 07/01/2017  Falls in the past year? 1 0 0 0 No  Number falls in past yr: 1 - - - -  Injury with Fall? 0 - - - -  Risk for fall due to : - - - - -    Any stairs in or around the home? Yes  outside 4 steps in front of house and 12 steps at back of house If so, are there any without handrails? Yes  Home free of loose throw rugs in walkways, pet beds, electrical cords, etc? Yes  Adequate lighting in your home to reduce risk of falls? Yes   ASSISTIVE DEVICES UTILIZED TO PREVENT FALLS:  Life alert? No  Use of a cane, walker or w/c? Yes  cane sometimes Grab bars in the bathroom? Yes  Shower chair or bench in shower? Yes  Elevated toilet seat or a handicapped toilet? Yes   TIMED UP AND GO: Was the test performed? Yes .    Length of time to ambulate 10 feet: 25 sec.  Reports she uses a cane sometimes.  Gait slow and steady without use of assistive device  Cognitive Function: MMSE - Mini Mental State Exam 09/14/2019  Orientation to time 5  Orientation to Place 5  Registration 3  Attention/ Calculation 5  Recall 3  Language- name 2 objects 2  Language- repeat 1  Language- follow 3 step command 3  Language- read & follow direction 1  Write a sentence 1  Copy design 0  Total score 29     Immunizations Immunization History  Administered Date(s) Administered  . Fluad Quad(high Dose 65+) 10/22/2018  . Influenza,inj,Quad PF,6+ Mos 12/26/2015, 11/01/2016, 10/23/2017  . PFIZER SARS-COV-2 Vaccination 04/05/2019, 04/28/2019  . Tdap 03/30/2014    TDAP status: Up to date Flu Vaccine status: Up to date Pneumococcal vaccine status: Declined,  Education has been provided regarding the importance of this vaccine but patient still declined. Advised may receive this vaccine at local pharmacy or Health Dept. Aware to provide a copy of the vaccination record if obtained from local pharmacy or Health Dept. Verbalized acceptance and understanding.  Patient declines pneumonia vaccine reporting she had one 10 years ago and subsequently got pneumonia and was hospitalized.  Covid-19 vaccine status: Completed vaccines Pfizer vaccine, first dose 04/05/2019 and second dose 04/28/2019  Qualifies for Shingles Vaccine? Yes   Zostavax completed No   Shingrix Completed?: No.    Education has been provided regarding the importance of this vaccine. Patient has been advised to call insurance company to determine out of pocket expense if they have not yet received this vaccine. Advised may also receive vaccine at local pharmacy or Health Dept. Verbalized acceptance and understanding.Reports her insurance doesn't cover Shingles Vaccine.  Screening Tests Health Maintenance  Topic Date Due  . PNA vac Low Risk Adult (1 of 2 - PCV13)  Never done  . INFLUENZA VACCINE  09/05/2019  . OPHTHALMOLOGY EXAM  10/02/2019  . HEMOGLOBIN A1C  12/09/2019  . FOOT EXAM  06/07/2020  . MAMMOGRAM  03/07/2021  . COLONOSCOPY  09/14/2021  . TETANUS/TDAP  03/30/2024  . DEXA SCAN  Completed  . COVID-19 Vaccine  Completed  . Hepatitis C Screening  Completed  . COLON CANCER SCREENING ANNUAL FOBT  Discontinued    Health Maintenance Health Maintenance Due  Topic Date Due  . PNA vac Low Risk Adult (1 of 2 - PCV13) Never done  . INFLUENZA VACCINE  09/05/2019    Colorectal cancer screening: Completed 2020. Repeat every 3 years Mammogram status: Completed 2021. Repeat every year Bone Density status: Completed 2021. Results reflect: Bone density results: NORMAL. Repeat every 2 years.  Lung Cancer Screening: (Low Dose CT Chest recommended if Age 48-80 years, 30 pack-year currently smoking OR have quit w/in 15years.) does not qualify.   Lung Cancer Screening Referral: not applicable  Additional Screening:  Hepatitis C Screening: does qualify; Completed 02/24/2015  Vision Screening: Recommended annual ophthalmology exams for early detection of glaucoma and other disorders of the eye. Is the patient up to date with their annual eye exam?  Yes  last on 09/13/2019. Who is the provider or what is the name of the office in which the patient attends annual eye exams? Dr. Katy Fitch  If pt is not established with a provider, would they like to be referred to a provider to establish care? Not applicable.  Dental Screening: Recommended annual dental exams for proper oral hygiene. Patient reports it has been several years since she went to the dentist related to financial reasons.  Community Resource Referral / Chronic Care Management: CRR required this visit?  No   CCM required this visit?  No    Plan:  1. Encounter for Medicare annual wellness exam: - Patient presents today for Medicare annual wellness exam. - Patient reports she has never smoked.   - Denies alcohol consumption. - Patient has chronic low back pain. Patient seeing [facility] for this. - Patient is diabetic. Last visit 08/16/2019 with her primary physician Dr. Wynetta Emery. Patient to continue plan as prescribed and follow-up as scheduled. - Mini Mental Status Examination score normal at 29/30. - Timed Up and Go Test longer not at goal at 25 seconds, goal < 12 seconds. Patient at increased risk of falls. Reports she ambulates with cane occasionally. Denies interest in Life Alert. - Colorectal cancer screening, mammogram, bone density screening, and hepatitis C screening all up-to-date. - Patient has completed Covid-19 vaccine series. - Last eye examination on 09/13/2019. Reports she was told she has bilateral cataracts. Reports she is scheduled to have right eye cataract removed on 10/08/2019. Reports she will have left eye cataract removed at a later date. Denies use of corrective lenses and/or contact lenses. - Reports she has  not seen dentist in many years related to financial reasons. - Discussed with patient what is a Soil scientist.  I went over her living will and [place].  Patient given written packet also. She will look over it and consider executing a living will.  If she does, I requested that she brings a copy of it for our records. Patient verbalized understanding.  2. Pneumococcal vaccination declined: - Patient declines pneumonia vaccine reporting she had one 10 years ago and subsequently got pneumonia and was hospitalized.   I have personally reviewed and noted the following in the patient's chart:   . Medical and social history . Use of alcohol, tobacco or illicit drugs  . Current medications and supplements . Functional ability and status . Nutritional status . Physical activity . Advanced directives . List of other physicians . Hospitalizations, surgeries, and ER visits in previous 12 months . Vitals . Screenings to include  cognitive, depression, and falls . Referrals and appointments  In addition, I have reviewed and discussed with patient certain preventive protocols, quality metrics, and best practice recommendations. A written personalized care plan for preventive services as well as general preventive health recommendations were provided to patient.  Camillia Herter, NP   09/14/2019

## 2019-09-17 ENCOUNTER — Other Ambulatory Visit: Payer: Self-pay | Admitting: Internal Medicine

## 2019-09-17 DIAGNOSIS — I10 Essential (primary) hypertension: Secondary | ICD-10-CM

## 2019-10-07 ENCOUNTER — Other Ambulatory Visit: Payer: Self-pay | Admitting: Internal Medicine

## 2019-10-07 DIAGNOSIS — G8929 Other chronic pain: Secondary | ICD-10-CM

## 2019-10-07 DIAGNOSIS — M5416 Radiculopathy, lumbar region: Secondary | ICD-10-CM

## 2019-10-07 MED ORDER — TRAMADOL HCL 50 MG PO TABS: 50.0000 mg | ORAL_TABLET | ORAL | 0 refills | Status: DC | PRN

## 2019-10-07 NOTE — Telephone Encounter (Signed)
Requested medication (s) are due for refill today: yes  Requested medication (s) are on the active medication list: yes  Last refill:  08/16/19 #120 0 refills  Future visit scheduled: yes  Notes to clinic:  not delegated per protocol     Requested Prescriptions  Pending Prescriptions Disp Refills   traMADol (ULTRAM) 50 MG tablet 120 tablet 0    Sig: Take 1 tablet (50 mg total) by mouth every 4 (four) hours as needed.      Not Delegated - Analgesics:  Opioid Agonists Failed - 10/07/2019  4:17 PM      Failed - This refill cannot be delegated      Passed - Urine Drug Screen completed in last 360 days.      Passed - Valid encounter within last 6 months    Recent Outpatient Visits           1 month ago Type 2 diabetes mellitus with obesity ([place])   [place] Karle Plumber B, MD   4 months ago Type 2 diabetes mellitus with obesity [facility])   [place] Ladell Pier, MD   7 months ago Appointment canceled by hospital   [place] Karle Plumber B, MD   8 months ago Type 2 diabetes mellitus without complication, without long-term current use of insulin [facility])   [place] Karle Plumber B, MD   11 months ago Type 2 diabetes mellitus without complication, without long-term current use of insulin [facility])   [place], [place]       Future Appointments             In 1 week Ladell Pier, MD [place]

## 2019-10-07 NOTE — Telephone Encounter (Signed)
Medication Refill - Medication: traMADol (ULTRAM) 50 MG tablet   Has the patient contacted their pharmacy? No. (Agent: If no, request that the patient contact the pharmacy for the refill.) (Agent: If yes, when and what did the pharmacy advise?)  Preferred Pharmacy (with phone number or street name):  [id number] [place], [place] - [address] Phone:  [phone]  Fax:  [phone]       Agent: Please be advised that RX refills may take up to 3 business days. We ask that you follow-up with your pharmacy.

## 2019-10-07 NOTE — Telephone Encounter (Signed)
Will forward to pcp

## 2019-10-08 DIAGNOSIS — H25811 Combined forms of age-related cataract, right eye: Secondary | ICD-10-CM | POA: Diagnosis not present

## 2019-10-14 ENCOUNTER — Other Ambulatory Visit: Payer: Self-pay

## 2019-10-14 ENCOUNTER — Ambulatory Visit: Payer: Medicare Other | Admitting: Internal Medicine

## 2019-10-18 ENCOUNTER — Encounter: Payer: Self-pay | Admitting: Internal Medicine

## 2019-10-18 ENCOUNTER — Ambulatory Visit (HOSPITAL_BASED_OUTPATIENT_CLINIC_OR_DEPARTMENT_OTHER): Payer: Medicare Other | Admitting: Pharmacist

## 2019-10-18 ENCOUNTER — Ambulatory Visit: Payer: Medicare Other | Attending: Internal Medicine | Admitting: Internal Medicine

## 2019-10-18 ENCOUNTER — Other Ambulatory Visit: Payer: Self-pay

## 2019-10-18 VITALS — BP 126/78 | HR 75 | Temp 98.2°F | Resp 16 | Wt 226.4 lb

## 2019-10-18 DIAGNOSIS — I10 Essential (primary) hypertension: Secondary | ICD-10-CM

## 2019-10-18 DIAGNOSIS — Z2821 Immunization not carried out because of patient refusal: Secondary | ICD-10-CM | POA: Insufficient documentation

## 2019-10-18 DIAGNOSIS — E1169 Type 2 diabetes mellitus with other specified complication: Secondary | ICD-10-CM | POA: Diagnosis not present

## 2019-10-18 DIAGNOSIS — Z9841 Cataract extraction status, right eye: Secondary | ICD-10-CM

## 2019-10-18 DIAGNOSIS — E669 Obesity, unspecified: Secondary | ICD-10-CM | POA: Diagnosis not present

## 2019-10-18 DIAGNOSIS — E785 Hyperlipidemia, unspecified: Secondary | ICD-10-CM | POA: Diagnosis not present

## 2019-10-18 DIAGNOSIS — M5416 Radiculopathy, lumbar region: Secondary | ICD-10-CM

## 2019-10-18 DIAGNOSIS — Z23 Encounter for immunization: Secondary | ICD-10-CM

## 2019-10-18 DIAGNOSIS — R252 Cramp and spasm: Secondary | ICD-10-CM | POA: Diagnosis not present

## 2019-10-18 DIAGNOSIS — G8929 Other chronic pain: Secondary | ICD-10-CM

## 2019-10-18 LAB — GLUCOSE, POCT (MANUAL RESULT ENTRY): POC Glucose: 119 mg/dl — AB (ref 70–99)

## 2019-10-18 NOTE — Progress Notes (Signed)
Patient presents for vaccination against influenza per orders of Dr. Johnson. Consent given. Counseling provided. No contraindications exists. Vaccine administered without incident.  ° °Luke Van Ausdall, PharmD, CPP °Clinical Pharmacist °[facility] °[phone] ° °

## 2019-10-18 NOTE — Progress Notes (Signed)
Patient ID: Rachel Herman, female    DOB: February 19, 1950  MRN: [id number]  CC: Diabetes and Hypertension   Subjective: Rachel Herman is a 69 y.o. female who presents for chronic ds management Her concerns today include: Pt with hx of HTN, DM, HL, DJD lumbar and spinal stenosiswith hx of laminectomy 2016, obesity, depression,DuctalCA LT s/plumpectomy/XRT (2015 and 2016, followed by Dr. Annamaria Boots), vaginal prolapse, CKD 3.   C/o getting cramps on both sides around rib cage x 4 mths. Occurring daily.  Having to take Zanaflex BID.  Does not cause drowsiness for her.  Was originally placed on it by GYN Dr. Zigmund Daniel in [place] after having surgery on her bladder.   DIABETES TYPE 2 Last A1C:   Results for orders placed or performed in visit on 10/18/19  POCT glucose (manual entry)  Result Value Ref Range   POC Glucose 119 (A) 70 - 99 mg/dl    Med Adherence:  '[x]'  Yes  - on Metformin Medication side effects:  '[]'  Yes    '[]'  No Home Monitoring?  '[x]'  Yes in the mornings and at nights about QOD.  Does not have log with her Home glucose results range:175-205 Diet Adherence: '[x]'  Yes    '[]'  No Exercise: Not walking as much as she was before because her back has been bothering her.  Also had cataract extraction RTeye little over 1 wk ago.  Told not to be out in the sun too much and no bending over for 4 wks post surgery Hypoglycemic episodes?: '[]'  Yes    '[x]'  No Numbness of the feet? '[]'  Yes    '[x]'  No Retinopathy hx? '[]'  Yes    '[x]'  No Last eye exam:  Followed by Dr. Schuyler Amor Comments:   HYPERTENSION Currently taking: see medication list Med Adherence: '[x]'  Yes    '[]'  No Medication side effects: '[]'  Yes    '[x]'  No Adherence with salt restriction: '[x]'  Yes    '[]'  No Home Monitoring?: '[x]'  Yes    '[]'  No Monitoring Frequency: '[]'  Yes    '[]'  No Home BP results range: '[]'  Yes    '[]'  No SOB? '[x]'  Yes when walking up an incline. Chest Pain?: '[]'  Yes    '[x]'  No Leg swelling?: '[]'  Yes    '[x]'  No Headaches?: '[]'  Yes     '[x]'  No Dizziness? '[]'  Yes    '[x]'  No Comments:   Had inj to RT hip by Dr. Ernestina Patches several wks ago.  Found it helpful Still takes tramadol as needed for her back.  She finds it helpful and it allows her to be functional at home during the day.  She denies any adverse side effects from the medication including drowsiness. Patient Active Problem List   Diagnosis Date Noted  . Chronic cough 08/03/2018  . Other intervertebral disc degeneration, lumbar region 03/18/2018  . 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  . 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 Lumbar pain with radiation down left and right leg 07/02/2016  . Thyroid nodule 06/09/2015  . Nodule of right lung 07/28/2014  . Diverticulosis of colon without hemorrhage 07/28/2014  . DJD (degenerative joint disease), lumbar 02/22/2014  . Lumbar pain with radiation down left leg 01/17/2014  . Breast cancer of upper-inner quadrant of left female breast ([place]) 01/05/2014  . Hyperlipidemia associated with type 2 diabetes mellitus ([place]) 09/28/2013  . S/P total hysterectomy and bilateral salpingo-oophorectomy 09/20/2013  . Essential hypertension   . Diabetes mellitus ([place]) 09/16/2013  Current Outpatient Medications on File Prior to Visit  Medication Sig Dispense Refill  . acetaminophen (TYLENOL) 500 MG tablet Take 1,000 mg by mouth every 6 (six) hours as needed for headache (pain).    Marland Kitchen albuterol (VENTOLIN HFA) 108 (90 Base) MCG/ACT inhaler Inhale 2 puffs into the lungs every 6 (six) hours as needed for wheezing or shortness of breath. 8 g 3  . anastrozole (ARIMIDEX) 1 MG tablet TAKE 1 TABLET BY MOUTH EVERY DAY 90 tablet 3  . Ascorbic Acid (VITAMIN C) 1000 MG tablet Take 1,000 mg by mouth daily.    Marland Kitchen aspirin EC 325 MG tablet Take 325 mg by mouth daily.    . Blood Glucose Monitoring Suppl (ONE TOUCH ULTRA MINI) w/Device KIT USE AS DIRECTED ONCE DAILY DX CODE E11.9 1 each 0  . carvedilol (COREG) 3.125 MG tablet TAKE 1  TABLET BY MOUTH 2 TIMES DAILY WITH A MEAL. 180 tablet 1  . cloNIDine (CATAPRES) 0.1 MG tablet Take 1 tablet (0.1 mg total) by mouth 2 (two) times daily. 60 tablet 6  . [facility] SENNA PLUS 8.6-50 MG tablet TAKE 2 TABLETS BY MOUTH AT BEDTIME AS NEEDED FOR MILD CONSTIPATION (Patient taking differently: Take 1 tablet by mouth at bedtime. ) 60 tablet 11  . gabapentin (NEURONTIN) 300 MG capsule TAKE 2 CAPSULES (600 MG) BY MOUTH 2 TIMES DAILY. 360 capsule 3  . hydrochlorothiazide (HYDRODIURIL) 25 MG tablet TAKE 1 TABLET BY MOUTH EVERYDAY AT BEDTIME 90 tablet 0  . metFORMIN (GLUCOPHAGE) 500 MG tablet Take 1.5 tablets (750 mg total) by mouth 2 (two) times daily. 270 tablet 0  . Multiple Vitamins-Minerals (MULTIVITAMIN WITH MINERALS) tablet Take 1 tablet by mouth daily. Reported on 03/13/2015    . omeprazole (PRILOSEC) 20 MG capsule TAKE 1 CAPSULE BY MOUTH EVERY DAY 90 capsule 0  . ONETOUCH DELICA LANCETS 62I MISC USE AS DIRECTED ONCE DAILY DX CODE E11.9 100 each 12  . ONETOUCH ULTRA test strip TEST ONCE DAILY AS DIRECTED E11.9 100 strip 12  . potassium chloride (KLOR-CON) 10 MEQ tablet TAKE 1 TABLET BY MOUTH EVERY DAY 90 tablet 0  . pravastatin (PRAVACHOL) 20 MG tablet TAKE 1 TABLET BY MOUTH EVERY DAY 90 tablet 1  . tizanidine (ZANAFLEX) 2 MG capsule Take 2 mg by mouth every 6 (six) hours as needed for muscle spasms. Take 2 tablets every 6 hours prn    . traMADol (ULTRAM) 50 MG tablet Take 1 tablet (50 mg total) by mouth every 4 (four) hours as needed. 120 tablet 0   No current facility-administered medications on file prior to visit.    No Known Allergies  Social History   Socioeconomic History  . Marital status: Married    Spouse name: Teghan Philbin   . Number of children: 3  . Years of education: 16  . Highest education level: Not on file  Occupational History    Employer: UNEMPLOYED  Tobacco Use  . Smoking status: Never Smoker  . Smokeless tobacco: Never Used  Vaping Use  . Vaping Use: Never  used  Substance and Sexual Activity  . Alcohol use: No  . Drug use: No  . Sexual activity: Not Currently    Birth control/protection: Post-menopausal  Other Topics Concern  . Not on file  Social History Narrative   Married to [employer] in 1986.    Has 3 children from previous marriage, 1 in [place], 1 in [place], 1 in prison ([place]).    Lives with husband.  Right-handed.   1 cup caffeine per day.   Social Determinants of Health   Financial Resource Strain:   . Difficulty of Paying Living Expenses: Not on file  Food Insecurity:   . Worried About Charity fundraiser in the Last Year: Not on file  . Ran Out of Food in the Last Year: Not on file  Transportation Needs:   . Lack of Transportation (Medical): Not on file  . Lack of Transportation (Non-Medical): Not on file  Physical Activity:   . Days of Exercise per Week: Not on file  . Minutes of Exercise per Session: Not on file  Stress:   . Feeling of Stress : Not on file  Social Connections:   . Frequency of Communication with Friends and Family: Not on file  . Frequency of Social Gatherings with Friends and Family: Not on file  . Attends Religious Services: Not on file  . Active Member of Clubs or Organizations: Not on file  . Attends Archivist Meetings: Not on file  . Marital Status: Not on file  Intimate Partner Violence:   . Fear of Current or Ex-Partner: Not on file  . Emotionally Abused: Not on file  . Physically Abused: Not on file  . Sexually Abused: Not on file    Family History  Problem Relation Age of Onset  . Diabetes Mother   . Multiple myeloma Mother 23  . Hearing loss Mother   . Diabetes Brother   . Cancer Brother 40       prostate cancer   . Diabetes Maternal Aunt   . Leukemia Maternal Aunt        dx in her 18s  . Alcoholism Brother   . Heart attack Father   . Diabetes Sister   . Diabetes Son     Past Surgical History:  Procedure Laterality Date  . ABDOMINAL  HYSTERECTOMY N/A 09/20/2013   Procedure: HYSTERECTOMY ABDOMINAL;  Surgeon: Osborne Oman, MD;  Location: [facility];  Service: Gynecology;  Laterality: N/A;  . BREAST LUMPECTOMY Left 2015   radiation  . BREAST LUMPECTOMY WITH AXILLARY LYMPH NODE BIOPSY Left 12/29/13   left breast   . LAPAROSCOPIC ASSISTED VAGINAL HYSTERECTOMY  09/20/2013   Procedure: LAPAROSCOPIC ASSISTED VAGINAL HYSTERECTOMY;  Surgeon: Osborne Oman, MD;  Location: [facility];  Service: Gynecology;;  PT WAS EXAMINED WHILE UP IN [place] AND IT WAS DECIDED TO OPEN PT DUE TO LARGE MASS  . LUMBAR LAMINECTOMY/DECOMPRESSION MICRODISCECTOMY Right 12/14/2014   Procedure: Right Lumbar three-four microdiskectomy;  Surgeon: Newman Pies, MD;  Location: [facility];  Service: Neurosurgery;  Laterality: Right;  Right Lumbar three-four microdiskectomy  . RADIOACTIVE SEED GUIDED PARTIAL MASTECTOMY WITH AXILLARY SENTINEL LYMPH NODE BIOPSY Left 12/29/2013   Procedure: LEFT BREAST RADIOACTIVE SEED LOCALIZED LUMPECTOMY WITH SENTINEL LYMPH NODE MAPPING;  Surgeon: Erroll Luna, MD;  Location: [facility];  Service: General;  Laterality: Left;  . RE-EXCISION OF BREAST LUMPECTOMY Left 03/02/2014   Procedure: RE-EXCISION OF LEFT BREAST LUMPECTOMY;  Surgeon: Erroll Luna, MD;  Location: [facility];  Service: General;  Laterality: Left;  . SALPINGOOPHORECTOMY  09/20/2013   Procedure: SALPINGO OOPHORECTOMY;  Surgeon: Osborne Oman, MD;  Location: [facility];  Service: Gynecology;;    ROS: Review of Systems Negative except as stated above  PHYSICAL EXAM: BP 126/78   Pulse 75   Temp 98.2 F (36.8 C)   Resp 16   Wt 226 lb 6.4 oz (102.7 kg)  SpO2 95%   BMI 37.67 kg/m   Wt Readings from Last 3 Encounters:  10/18/19 226 lb 6.4 oz (102.7 kg)  09/14/19 223 lb 3.2 oz (101.2 kg)  06/08/19 225 lb 12.8 oz (102.4 kg)    Physical Exam  General appearance - alert, well appearing, and in no distress Mental status -  normal mood, behavior, speech, dress, motor activity, and thought processes Eyes - pupils equal and reactive, extraocular eye movements intact Chest - clear to auscultation, no wheezes, rales or rhonchi, symmetric air entry Heart - normal rate, regular rhythm, normal S1, S2, no murmurs, rubs, clicks or gallops Extremities - peripheral pulses normal, no pedal edema, no clubbing or cyanosis  CMP Latest Ref Rng & Units 03/17/2019 10/19/2018 03/18/2018  Glucose 70 - 99 mg/dL 182(H) 127(H) 124(H)  BUN 8 - 23 mg/dL '21 21 18  ' Creatinine 0.44 - 1.00 mg/dL 1.07(H) 1.07(H) 1.11(H)  Sodium 135 - 145 mmol/L 143 139 143  Potassium 3.5 - 5.1 mmol/L 3.7 3.9 4.0  Chloride 98 - 111 mmol/L 104 99 99  CO2 22 - 32 mmol/L '28 28 22  ' Calcium 8.9 - 10.3 mg/dL 9.4 9.3 10.0  Total Protein 6.5 - 8.1 g/dL 7.2 7.3 -  Total Bilirubin 0.3 - 1.2 mg/dL 0.4 0.5 -  Alkaline Phos 38 - 126 U/L 51 52 -  AST 15 - 41 U/L 36 48(H) -  ALT 0 - 44 U/L 39 44 -   Lipid Panel     Component Value Date/Time   CHOL 190 01/22/2018 1201   TRIG 286 (H) 01/22/2018 1201   HDL 41 01/22/2018 1201   CHOLHDL 4.6 (H) 01/22/2018 1201   CHOLHDL 5.9 09/27/2013 1053   VLDL 48 (H) 09/27/2013 1053   LDLCALC 92 01/22/2018 1201    CBC    Component Value Date/Time   WBC 7.0 03/17/2019 0904   RBC 4.45 03/17/2019 0904   HGB 12.2 03/17/2019 0904   HGB 14.5 11/08/2016 0915   HCT 38.6 03/17/2019 0904   HCT 44.2 11/08/2016 0915   PLT 241 03/17/2019 0904   PLT 295 11/08/2016 0915   MCV 86.7 03/17/2019 0904   MCV 85.3 11/08/2016 0915   MCH 27.4 03/17/2019 0904   MCHC 31.6 03/17/2019 0904   RDW 14.1 03/17/2019 0904   RDW 15.1 (H) 11/08/2016 0915   LYMPHSABS 1.9 03/17/2019 0904   LYMPHSABS 1.7 11/08/2016 0915   MONOABS 0.5 03/17/2019 0904   MONOABS 0.7 11/08/2016 0915   EOSABS 0.3 03/17/2019 0904   EOSABS 0.1 11/08/2016 0915   BASOSABS 0.0 03/17/2019 0904   BASOSABS 0.0 11/08/2016 0915    ASSESSMENT AND PLAN: 1. Type 2 diabetes mellitus  with obesity ([place]) Reported blood sugars at goal.  Continue Metformin and healthy eating habits.  Try to move as much as she can. - POCT glucose (manual entry) - Hemoglobin A1c  2. Essential hypertension At goal.  Continue current medications and low-salt diet. - Basic Metabolic Panel  3. Need for influenza vaccination Given today.  4. Hyperlipidemia associated with type 2 diabetes mellitus ([place]) Continue Pravachol.  However I have recommended taking it every other day to see whether the cramps will decrease. - Lipid panel  5. Muscular cramp Check chemistry today to make sure her electrolytes are okay.  If they are okay, I told patient to try taking the Pravachol every other day to see whether the cramps decreased.  6. Pneumococcal vaccination declined This was offered and patient declined.  7. Status post  right cataract extraction   8. Chronic radicular pain of lower back Due for update of her controlled substance prescribing agreement which we did today.  She is agreeable to urine drug screen.  Last took tramadol last evening.  She reports good pain relief and increased functionality with the tramadol.  Denies any significant adverse effects of the medication.  [place] controlled substance reporting system is reviewed whenever medication is refilled. - [id number] 11+Oxyco+Alc+Crt-Bund    Patient was given the opportunity to ask questions.  Patient verbalized understanding of the plan and was able to repeat key elements of the plan.   Orders Placed This Encounter  Procedures  . Hemoglobin A1c  . Lipid panel  . Basic Metabolic Panel  . [id number] 11+Oxyco+Alc+Crt-Bund  . POCT glucose (manual entry)     Requested Prescriptions    No prescriptions requested or ordered in this encounter    Return in about 4 months (around 02/17/2020).  Karle Plumber, MD, FACP

## 2019-10-18 NOTE — Patient Instructions (Addendum)
We will check your electrolytes today when we do your blood test.  If your potassium and calcium level are normal.  Then I would recommend taking the Pravachol every other day to see whether the cramps decreased.  Influenza Virus Vaccine injection (Fluarix) What is this medicine? INFLUENZA VIRUS VACCINE (in floo EN zuh VAHY ruhs vak SEEN) helps to reduce the risk of getting influenza also known as the flu. This medicine may be used for other purposes; ask your health care provider or pharmacist if you have questions. COMMON BRAND NAME(S): Fluarix, Fluzone What should I tell my health care provider before I take this medicine? They need to know if you have any of these conditions:  bleeding disorder like hemophilia  fever or infection  Guillain-Barre syndrome or other neurological problems  immune system problems  infection with the human immunodeficiency virus (HIV) or AIDS  low blood platelet counts  multiple sclerosis  an unusual or allergic reaction to influenza virus vaccine, eggs, chicken proteins, latex, gentamicin, other medicines, foods, dyes or preservatives  pregnant or trying to get pregnant  breast-feeding How should I use this medicine? This vaccine is for injection into a muscle. It is given by a health care professional. A copy of Vaccine Information Statements will be given before each vaccination. Read this sheet carefully each time. The sheet may change frequently. Talk to your pediatrician regarding the use of this medicine in children. Special care may be needed. Overdosage: If you think you have taken too much of this medicine contact a poison control center or emergency room at once. NOTE: This medicine is only for you. Do not share this medicine with others. What if I miss a dose? This does not apply. What may interact with this medicine?  chemotherapy or radiation therapy  medicines that lower your immune system like etanercept, anakinra, infliximab,  and adalimumab  medicines that treat or prevent blood clots like warfarin  phenytoin  steroid medicines like prednisone or cortisone  theophylline  vaccines This list may not describe all possible interactions. Give your health care provider a list of all the medicines, herbs, non-prescription drugs, or dietary supplements you use. Also tell them if you smoke, drink alcohol, or use illegal drugs. Some items may interact with your medicine. What should I watch for while using this medicine? Report any side effects that do not go away within 3 days to your doctor or health care professional. Call your health care provider if any unusual symptoms occur within 6 weeks of receiving this vaccine. You may still catch the flu, but the illness is not usually as bad. You cannot get the flu from the vaccine. The vaccine will not protect against colds or other illnesses that may cause fever. The vaccine is needed every year. What side effects may I notice from receiving this medicine? Side effects that you should report to your doctor or health care professional as soon as possible:  allergic reactions like skin rash, itching or hives, swelling of the face, lips, or tongue Side effects that usually do not require medical attention (report to your doctor or health care professional if they continue or are bothersome):  fever  headache  muscle aches and pains  pain, tenderness, redness, or swelling at site where injected  weak or tired This list may not describe all possible side effects. Call your doctor for medical advice about side effects. You may report side effects to FDA at 1-800-FDA-1088. Where should I keep my medicine? This  vaccine is only given in a clinic, pharmacy, doctor's office, or other health care setting and will not be stored at home. NOTE: This sheet is a summary. It may not cover all possible information. If you have questions about this medicine, talk to your doctor,  pharmacist, or health care provider.  2020 Elsevier/Gold Standard (2007-08-19 09:30:40)

## 2019-10-19 LAB — BASIC METABOLIC PANEL
BUN/Creatinine Ratio: 29 — ABNORMAL HIGH (ref 12–28)
BUN: 24 mg/dL (ref 8–27)
CO2: 26 mmol/L (ref 20–29)
Calcium: 9.7 mg/dL (ref 8.7–10.3)
Chloride: 100 mmol/L (ref 96–106)
Creatinine, Ser: 0.83 mg/dL (ref 0.57–1.00)
GFR calc Af Amer: 83 mL/min/{1.73_m2} (ref >59)
GFR calc non Af Amer: 72 mL/min/{1.73_m2} (ref >59)
Glucose: 104 mg/dL — ABNORMAL HIGH (ref 65–99)
Potassium: 4.6 mmol/L (ref 3.5–5.2)
Sodium: 141 mmol/L (ref 134–144)

## 2019-10-19 LAB — LIPID PANEL
Chol/HDL Ratio: 4.3 ratio (ref 0.0–4.4)
Cholesterol, Total: 191 mg/dL (ref 100–199)
HDL: 44 mg/dL (ref >39)
LDL Chol Calc (NIH): 110 mg/dL — ABNORMAL HIGH (ref 0–99)
Triglycerides: 215 mg/dL — ABNORMAL HIGH (ref 0–149)
VLDL Cholesterol Cal: 37 mg/dL (ref 5–40)

## 2019-10-19 LAB — HEMOGLOBIN A1C
Est. average glucose Bld gHb Est-mCnc: 134 mg/dL
Hgb A1c MFr Bld: 6.3 % — ABNORMAL HIGH (ref 4.8–5.6)

## 2019-10-20 NOTE — Progress Notes (Signed)
Let patient know that her A1c is 6.3 with goal being less than 7.  This means that her diabetes is well controlled.  Her LDL cholesterol is 110 with goal being less than 70 in patients with diabetes.  Ideally, I would recommend increasing the Pravachol but will hold off on doing so since she has been experiencing muscle cramps that may be related to the Pravachol.  Her electrolytes including sodium and potassium levels are normal.  Kidney function is good.

## 2019-10-23 ENCOUNTER — Telehealth: Payer: Self-pay

## 2019-10-23 NOTE — Telephone Encounter (Signed)
Contacted pt to go over lab results pt is aware and doesn't have any questions or concerns 

## 2019-10-25 LAB — DRUG SCREEN 764883 11+OXYCO+ALC+CRT-BUND
Amphetamines, Urine: NEGATIVE ng/mL
BENZODIAZ UR QL: NEGATIVE ng/mL
Barbiturate: NEGATIVE ng/mL
Cannabinoid Quant, Ur: NEGATIVE ng/mL
Cocaine (Metabolite): NEGATIVE ng/mL
Creatinine: 97.4 mg/dL (ref 20.0–300.0)
Ethanol: NEGATIVE %
Meperidine: NEGATIVE ng/mL
Methadone Screen, Urine: NEGATIVE ng/mL
OPIATE SCREEN URINE: NEGATIVE ng/mL
Oxycodone/Oxymorphone, Urine: NEGATIVE ng/mL
Phencyclidine: NEGATIVE ng/mL
Propoxyphene: NEGATIVE ng/mL
pH, Urine: 6 (ref 4.5–8.9)

## 2019-10-25 LAB — TRAMADOL GC/MS, URINE
Tramadol gc/ms Conf: 17870 ng/mL
Tramadol: POSITIVE — AB

## 2019-10-28 ENCOUNTER — Other Ambulatory Visit: Payer: Self-pay | Admitting: Internal Medicine

## 2019-10-28 DIAGNOSIS — E119 Type 2 diabetes mellitus without complications: Secondary | ICD-10-CM

## 2019-11-03 DIAGNOSIS — H2512 Age-related nuclear cataract, left eye: Secondary | ICD-10-CM | POA: Diagnosis not present

## 2019-11-05 DIAGNOSIS — H25812 Combined forms of age-related cataract, left eye: Secondary | ICD-10-CM | POA: Diagnosis not present

## 2019-11-09 ENCOUNTER — Other Ambulatory Visit: Payer: Self-pay | Admitting: Internal Medicine

## 2019-11-09 DIAGNOSIS — E876 Hypokalemia: Secondary | ICD-10-CM

## 2019-11-09 NOTE — Telephone Encounter (Signed)
Requested Prescriptions  Pending Prescriptions Disp Refills  . potassium chloride (KLOR-CON) 10 MEQ tablet [Pharmacy Med Name: POTASSIUM CL ER 10 MEQ TABLET] 90 tablet 3    Sig: TAKE 1 TABLET BY MOUTH EVERY DAY     Endocrinology:  Minerals - Potassium Supplementation Passed - 11/09/2019  1:25 AM      Passed - K in normal range and within 360 days    Potassium  Date Value Ref Range Status  10/18/2019 4.6 3.5 - 5.2 mmol/L Final  11/08/2016 3.0 (LL) 3.5 - 5.1 mEq/L Final         Passed - Cr in normal range and within 360 days    Creatinine  Date Value Ref Range Status  10/18/2019 97.4 20.0 - 300.0 mg/dL Final  11/08/2016 1.2 (H) 0.6 - 1.1 mg/dL Final   Creatinine, Ser  Date Value Ref Range Status  10/18/2019 0.83 0.57 - 1.00 mg/dL Final   Creatinine, POC  Date Value Ref Range Status  07/02/2016 100mg  mg/dL Final         Passed - Valid encounter within last 12 months    Recent Outpatient Visits          3 weeks ago Need for influenza vaccination   [place], Jarome Matin, RPH-CPP   3 weeks ago Type 2 diabetes mellitus with obesity ([place])   [place] Karle Plumber B, MD   2 months ago Type 2 diabetes mellitus with obesity [facility])   [place] Ladell Pier, MD   5 months ago Type 2 diabetes mellitus with obesity [facility])   [place], MD   8 months ago Appointment canceled by hospital   [place], MD      Future Appointments            In 3 months Wynetta Emery Dalbert Batman, MD [place]

## 2019-11-15 ENCOUNTER — Other Ambulatory Visit: Payer: Self-pay | Admitting: Internal Medicine

## 2019-11-15 DIAGNOSIS — G8929 Other chronic pain: Secondary | ICD-10-CM

## 2019-11-15 MED ORDER — TRAMADOL HCL 50 MG PO TABS: 50.0000 mg | ORAL_TABLET | ORAL | 0 refills | Status: DC | PRN

## 2019-11-15 NOTE — Telephone Encounter (Signed)
Requested medication (s) are due for refill today -yes  Requested medication (s) are on the active medication list -yes  Future visit scheduled -yes  Last refill: 10/07/19  Notes to clinic: Request non delegated Rx  Requested Prescriptions  Pending Prescriptions Disp Refills   traMADol (ULTRAM) 50 MG tablet 120 tablet 0    Sig: Take 1 tablet (50 mg total) by mouth every 4 (four) hours as needed.      Not Delegated - Analgesics:  Opioid Agonists Failed - 11/15/2019  2:43 PM      Failed - This refill cannot be delegated      Passed - Urine Drug Screen completed in last 360 days.      Passed - Valid encounter within last 6 months    Recent Outpatient Visits           4 weeks ago Need for influenza vaccination   [place], Annie Main L, RPH-CPP   4 weeks ago Type 2 diabetes mellitus with obesity [facility])   [place] Karle Plumber B, MD   3 months ago Type 2 diabetes mellitus with obesity [facility])   [place] Karle Plumber B, MD   5 months ago Type 2 diabetes mellitus with obesity [facility])   [place] Ladell Pier, MD   8 months ago Appointment canceled by hospital   [place] Ladell Pier, MD       Future Appointments             In 3 months Ladell Pier, MD [place]                Requested Prescriptions  Pending Prescriptions Disp Refills   traMADol (ULTRAM) 50 MG tablet 120 tablet 0    Sig: Take 1 tablet (50 mg total) by mouth every 4 (four) hours as needed.      Not Delegated - Analgesics:  Opioid Agonists Failed - 11/15/2019  2:43 PM      Failed - This refill cannot be delegated      Passed - Urine Drug Screen completed in last 360 days.      Passed - Valid encounter within last 6 months    Recent Outpatient Visits           4 weeks ago  Need for influenza vaccination   [place], Annie Main L, RPH-CPP   4 weeks ago Type 2 diabetes mellitus with obesity [facility])   [place] Karle Plumber B, MD   3 months ago Type 2 diabetes mellitus with obesity [facility])   [place], MD   5 months ago Type 2 diabetes mellitus with obesity [facility])   [place], MD   8 months ago Appointment canceled by hospital   [place], MD       Future Appointments             In 3 months Wynetta Emery Dalbert Batman, MD [place]

## 2019-11-15 NOTE — Telephone Encounter (Signed)
Medication: traMADol (ULTRAM) 50 MG tablet [[id number]]   Has the patient contacted their pharmacy? YES (Agent: If no, request that the patient contact the pharmacy for the refill.) (Agent: If yes, when and what did the pharmacy advise?)  Preferred Pharmacy (with phone number or street name): [id number] - [place], [place] - [address] [place] [address] Phone: [phone] Fax: [phone] Hours: Not open 24 hours    Agent: Please be advised that RX refills may take up to 3 business days. We ask that you follow-up with your pharmacy.

## 2019-12-08 ENCOUNTER — Other Ambulatory Visit: Payer: Self-pay | Admitting: Internal Medicine

## 2019-12-08 DIAGNOSIS — I10 Essential (primary) hypertension: Secondary | ICD-10-CM

## 2019-12-08 NOTE — Telephone Encounter (Signed)
Requested Prescriptions  Pending Prescriptions Disp Refills  . cloNIDine (CATAPRES) 0.1 MG tablet [Pharmacy Med Name: CLONIDINE HCL 0.1 MG TABLET] 180 tablet 1    Sig: TAKE 1 TABLET (0.1 MG TOTAL) BY MOUTH 2 (TWO) TIMES DAILY.     Cardiovascular:  Alpha-2 Agonists Passed - 12/08/2019  1:23 AM      Passed - Last BP in normal range    BP Readings from Last 1 Encounters:  10/18/19 126/78         Passed - Last Heart Rate in normal range    Pulse Readings from Last 1 Encounters:  10/18/19 75         Passed - Valid encounter within last 6 months    Recent Outpatient Visits          1 month ago Need for influenza vaccination   [place], Jarome Matin, RPH-CPP   1 month ago Type 2 diabetes mellitus with obesity [facility])   [place] Karle Plumber B, MD   3 months ago Type 2 diabetes mellitus with obesity [facility])   [place], MD   6 months ago Type 2 diabetes mellitus with obesity [facility])   [place] Ladell Pier, MD   9 months ago Appointment canceled by hospital   [place], MD      Future Appointments            In 2 months Wynetta Emery Dalbert Batman, MD [place]

## 2019-12-10 NOTE — Progress Notes (Signed)
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 Ultrasound of the left axilla is negative.      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    12/29/2013 Surgery   Left lumpectomy with close (<0.1cm) inferior margin   03/02/2014 Surgery   Reexcision for close margin, path negative for malignancy.   04/05/2014 - 05/20/2014 Radiation Therapy   adjuvant breast radiation    05/27/2014 Imaging   Bone density scan: normal    06/07/2014 -  Anti-estrogen oral therapy   Exemestane 25 mg daily, she stopped in Nov 2018 due to high copay. Change anastrozole on 05/09/2017    06/20/2015 Imaging   Bone scan 06/20/2015 IMPRESSION: No evidence of metastatic disease. Mild increased activity dorsal aspect right midfoot probable degenerative in nature. Clinical correlation is necessary.   02/29/2016 Mammogram   MM DIAG BREAST TOMO BIALTERAL 02/29/16 IMPRESSION: Stable left breast lumpectomy site. No mammographic  evidence of malignancy in the bilateral breasts.   03/03/2017 Mammogram   IMPRESSION: 1. No mammographic evidence of malignancy in either breast. 2. Stable left breast posttreatment changes.      CURRENT THERAPY:  Exemestane 45m daily, started on 06/07/2014. She stopped in Nov 2018 due to high copay. Changed toanastrozole on 05/09/2017. Plan to complete at end of 2021.  INTERVAL HISTORY:  Rachel ROTHBAUERis here for a follow up of left breast cancer. She was last seen by me 9 months ago. She presents to the clinic alone. She notes in the end of August she started having sharp breast pain left and right breast but mostly left. She notes this has worsened and mor diffuse in her left breast and will last 2 minutes at a time intermittently several times a day. She notes it hurts to raise her left arm and to breath deep and quickly.  She also notes burning sensation in her right leg which she had that 2 weeks ago and is happening currently. She is seeing Dr DMarlou Safor this who gave her an injection. She is on Gabapentin 6047mBID and Tramadol q6hours for her back pain.     REVIEW OF SYSTEMS:   Constitutional: Denies fevers, chills or abnormal weight loss Eyes: Denies blurriness of vision Ears, nose, mouth, throat, and face: Denies mucositis or sore throat Respiratory: Denies cough, dyspnea or wheezes Cardiovascular: Denies palpitation, chest discomfort or lower extremity swelling Gastrointestinal:  Denies nausea, heartburn or change in bowel habits Skin: Denies abnormal skin rashes MSK: (+) back pain  Lymphatics: Denies new lymphadenopathy or easy  bruising Neurological:Denies numbness, tingling or new weaknesses (+) occasional burning sensation of right leg  Behavioral/Psych: Mood is stable, no new changes  Breast: (+) left breast pain radiating ot mid chest and mildly into right breast  All other systems were reviewed with the patient and are negative.  MEDICAL HISTORY:  Past Medical  History:  Diagnosis Date  . Breast cancer ([place]) 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  . Diabetes mellitus ([place]) 09/16/13   Diagnosed on 09/16/13; HgA1C was 7.2/metformin  . Dizziness   . GERD (gastroesophageal reflux disease)   . Headache   . Hyperlipidemia   . Hypertension 2014  . Low back pain   . Obesity, morbid, BMI 40.0-49.9 ([place])   . PONV (postoperative nausea and vomiting)   . S/P radiation therapy 04/05/14-05/20/14   left breast 60.4Gy totaldose    SURGICAL HISTORY: Past Surgical History:  Procedure Laterality Date  . ABDOMINAL HYSTERECTOMY N/A 09/20/2013   Procedure: HYSTERECTOMY ABDOMINAL;  Surgeon: Osborne Oman, MD;  Location: [facility];  Service: Gynecology;  Laterality: N/A;  . BREAST LUMPECTOMY Left 2015   radiation  . BREAST LUMPECTOMY WITH AXILLARY LYMPH NODE BIOPSY Left 12/29/13   left breast   . CATARACT EXTRACTION Right   . LAPAROSCOPIC ASSISTED VAGINAL HYSTERECTOMY  09/20/2013   Procedure: LAPAROSCOPIC ASSISTED VAGINAL HYSTERECTOMY;  Surgeon: Osborne Oman, MD;  Location: [facility];  Service: Gynecology;;  PT WAS EXAMINED WHILE UP IN [place] AND IT WAS DECIDED TO OPEN PT DUE TO LARGE MASS  . LUMBAR LAMINECTOMY/DECOMPRESSION MICRODISCECTOMY Right 12/14/2014   Procedure: Right Lumbar three-four microdiskectomy;  Surgeon: Newman Pies, MD;  Location: [facility];  Service: Neurosurgery;  Laterality: Right;  Right Lumbar three-four microdiskectomy  . RADIOACTIVE SEED GUIDED PARTIAL MASTECTOMY WITH AXILLARY SENTINEL LYMPH NODE BIOPSY Left 12/29/2013   Procedure: LEFT BREAST RADIOACTIVE SEED LOCALIZED LUMPECTOMY WITH SENTINEL LYMPH NODE MAPPING;  Surgeon: Erroll Luna, MD;  Location: [facility];  Service: General;  Laterality: Left;  . RE-EXCISION OF BREAST LUMPECTOMY Left 03/02/2014   Procedure: RE-EXCISION OF LEFT BREAST LUMPECTOMY;  Surgeon: Erroll Luna, MD;  Location: [facility];  Service: General;  Laterality: Left;  .  SALPINGOOPHORECTOMY  09/20/2013   Procedure: SALPINGO OOPHORECTOMY;  Surgeon: Osborne Oman, MD;  Location: [facility];  Service: Gynecology;;    I have reviewed the social history and family history with the patient and they are unchanged from previous note.  ALLERGIES:  has No Known Allergies.  MEDICATIONS:  Current Outpatient Medications  Medication Sig Dispense Refill  . acetaminophen (TYLENOL) 500 MG tablet Take 1,000 mg by mouth every 6 (six) hours as needed for headache (pain).    Marland Kitchen albuterol (VENTOLIN HFA) 108 (90 Base) MCG/ACT inhaler Inhale 2 puffs into the lungs every 6 (six) hours as needed for wheezing or shortness of breath. 8 g 3  . anastrozole (ARIMIDEX) 1 MG tablet TAKE 1 TABLET BY MOUTH EVERY DAY 90 tablet 3  . Ascorbic Acid (VITAMIN C) 1000 MG tablet Take 1,000 mg by mouth daily.    Marland Kitchen aspirin EC 325 MG tablet Take 325 mg by mouth daily.    . Blood Glucose Monitoring Suppl (ONE TOUCH ULTRA MINI) w/Device KIT USE AS DIRECTED ONCE DAILY DX CODE E11.9 1 each 0  . carvedilol (COREG) 3.125 MG tablet TAKE 1 TABLET BY MOUTH 2 TIMES DAILY WITH A MEAL. 180 tablet 1  . cloNIDine (CATAPRES) 0.1 MG tablet TAKE 1 TABLET (0.1 MG TOTAL) BY  MOUTH 2 (TWO) TIMES DAILY. 180 tablet 1  . [facility] SENNA PLUS 8.6-50 MG tablet TAKE 2 TABLETS BY MOUTH AT BEDTIME AS NEEDED FOR MILD CONSTIPATION (Patient taking differently: Take 1 tablet by mouth at bedtime. ) 60 tablet 11  . DULoxetine (CYMBALTA) 20 MG capsule Take 1 capsule (20 mg total) by mouth daily. 30 capsule 0  . gabapentin (NEURONTIN) 300 MG capsule TAKE 2 CAPSULES (600 MG) BY MOUTH 2 TIMES DAILY. 360 capsule 3  . hydrochlorothiazide (HYDRODIURIL) 25 MG tablet TAKE 1 TABLET BY MOUTH EVERYDAY AT BEDTIME 90 tablet 0  . metFORMIN (GLUCOPHAGE) 500 MG tablet TAKE 1 & 1/2 TABLETS (750 MG TOTAL) BY MOUTH 2 (TWO) TIMES DAILY. 270 tablet 0  . Multiple Vitamins-Minerals (MULTIVITAMIN WITH MINERALS) tablet Take 1 tablet by mouth daily. Reported on 03/13/2015      . omeprazole (PRILOSEC) 20 MG capsule TAKE 1 CAPSULE BY MOUTH EVERY DAY 90 capsule 0  . ONETOUCH DELICA LANCETS 09O MISC USE AS DIRECTED ONCE DAILY DX CODE E11.9 100 each 12  . ONETOUCH ULTRA test strip TEST ONCE DAILY AS DIRECTED E11.9 100 strip 12  . potassium chloride (KLOR-CON) 10 MEQ tablet TAKE 1 TABLET BY MOUTH EVERY DAY 90 tablet 3  . pravastatin (PRAVACHOL) 20 MG tablet TAKE 1 TABLET BY MOUTH EVERY DAY 90 tablet 1  . tizanidine (ZANAFLEX) 2 MG capsule Take 2 mg by mouth every 6 (six) hours as needed for muscle spasms. Take 2 tablets every 6 hours prn    . traMADol (ULTRAM) 50 MG tablet Take 1 tablet (50 mg total) by mouth every 4 (four) hours as needed. 120 tablet 0   No current facility-administered medications for this visit.    PHYSICAL EXAMINATION: ECOG PERFORMANCE STATUS: 2 - Symptomatic, <50% confined to bed  Vitals:   12/15/19 0946  BP: 127/63  Pulse: 90  Resp: 19  Temp: 97.9 F (36.6 C)  SpO2: 97%   Filed Weights   12/15/19 0946  Weight: 225 lb 14.4 oz (102.5 kg)    GENERAL:alert, no distress and comfortable SKIN: skin color, texture, turgor are normal, no rashes or significant lesions EYES: normal, Conjunctiva are pink and non-injected, sclera clear  NECK: supple, thyroid normal size, non-tender, without nodularity LYMPH:  no palpable lymphadenopathy in the cervical, axillary  LUNGS: clear to auscultation and percussion with normal breathing effort HEART: regular rate & rhythm and no murmurs and no lower extremity edema ABDOMEN:abdomen soft, non-tender and normal bowel sounds (+) Mild diffuse tenderness  Musculoskeletal:no cyanosis of digits and no clubbing  NEURO: alert & oriented x 3 with fluent speech, no focal motor/sensory deficits BREAST: (+) Left lumpectomy: Surgical Incision healed well (+) Diffuse left breast tenderness. Right Breast exam benign.   LABORATORY DATA:  I have reviewed the data as listed CBC Latest Ref Rng & Units 12/15/2019  03/17/2019 10/19/2018  WBC 4.0 - 10.5 K/uL 7.6 7.0 8.9  Hemoglobin 12.0 - 15.0 g/dL 13.0 12.2 13.5  Hematocrit 36 - 46 % 41.3 38.6 43.6  Platelets 150 - 400 K/uL 277 241 306     CMP Latest Ref Rng & Units 12/15/2019 10/18/2019 03/17/2019  Glucose 70 - 99 mg/dL 139(H) 104(H) 182(H)  BUN 8 - 23 mg/dL '22 24 21  ' Creatinine 0.44 - 1.00 mg/dL 1.00 0.83 1.07(H)  Sodium 135 - 145 mmol/L 141 141 143  Potassium 3.5 - 5.1 mmol/L 3.5 4.6 3.7  Chloride 98 - 111 mmol/L 102 100 104  CO2 22 - 32 mmol/L 28  26 28  Calcium 8.9 - 10.3 mg/dL 9.3 9.7 9.4  Total Protein 6.5 - 8.1 g/dL 7.3 - 7.2  Total Bilirubin 0.3 - 1.2 mg/dL 0.5 - 0.4  Alkaline Phos 38 - 126 U/L 48 - 51  AST 15 - 41 U/L 23 - 36  ALT 0 - 44 U/L 26 - 39      RADIOGRAPHIC STUDIES: I have personally reviewed the radiological images as listed and agreed with the findings in the report. No results found.   ASSESSMENT & PLAN:  SEE BEHARRY is a 69 y.o. 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 Grade 2, Ki-67 73%. -She was diagnosed in 12/2013.She is status post complete surgical resection with lumpectomy, 12/29/13 -I previously discussed the Oncotype DX result with her and her husband. The recurrence score is 23, with the predicted 10 year recurrence risk of 15% with tamoxifen alone. The benefit of chemotherapy is uncertain in this intermediate risk group.Adjuvant chemo was not recommended -She started adjuvant antiestrogen therapy withExemestanein 06/2014, but stopped in 12/2016 due to high copay. She started Anastrozolein 05/2017 and toleratesmoderatelywell  -She notes recent left breast pain but has otherwise been clinically doing well. Labs reviewed, CBC and CMP WNL. Physical exam shows no mass, but has diffuse left breast tenderness. Her 03/2019 mammogram was normal. Will order left mammogram and [place] to further  evaluate.  -Continue surveillance. Screening mammogram in 03/2020 -Continue Anastrozole. Plan to complete in 2 months  -Phone call in 2 months. OV in 4 months   2. Left breast pain  -Since the end of August 2021 she started having sharp breast pain left radiating to mid chest and mildly in right breast. She notes this has worsened and more diffuse in her left breast and will last 2 minutes at a time intermittently several times a day.  -She notes it hurts to raise her left arm and to breath deep and quickly.  -Her breast exam today showed no palpable mass, but did have diffuse left breast tenderness.  -I recommend Mammogram and [place]. If negative, I suspect this is related to her surgery.  -I will call in Cymbalta for her pain. I discussed this has drug interaction with her Tramadol so she should not take it at the same time. If she has significant drowsiness she should stop medication.   3.Bone health -Her bone density scan was normal in 05/2014, Normal bone density, T score -0.3 on 03/03/17 and normal with -0.5 lowest T-score on 04/01/19,  -She will continue calcium and vitamin D.  4. Hypertension, diabetes, dyslipidemia, chronic low back pain, arthritis, Morbid Obesity  -She will continue follow-up with her primary care physician and ortho Dr Marlou Sa -I encouraged her to manage her weight.Weight is stable.  -For recent recurrent burning sensation for her right leg she can f/u with Dr Marlou Sa for another injection.  -For her back pain, she is on Gabapentin 614m BID and Tramadol 2-3 times a day.    5.Right lung nodule, likely benign -She had a CT abdomen and pelvis in June 2016, which incidentally found a 4 mm nodule in the right lower lobe. I personally reviewed the image.  -She had CT chest on 08/18/2015 for chest pain, which showed stable 556mright lung base subpleural nodule, likely benign, no need to follow-up since she is a never smoker.  6. Thyroid Benign follicular nodule -Her CT scan  revealed a 1.7 cm right thyroid nodule. -Ultrasound guided thyroid nodule biopsy on 05/09/2015 showed benign follicular nodule    PLAN: -I called in Cymbalta today for her left breast pain  -Left breast diagnostic mammogram and [place] in 2 weeks  -Phone call in 2 months  -Lab and F/u in 4 months  -Screening b/l mammogram in 03/2020 -Continue anastrozole. Plan to complete on next visit in 2 months   No problem-specific Assessment & Plan notes found for this encounter.   Orders Placed This Encounter  Procedures  . MM DIAG BREAST TOMO UNI LEFT    Standing Status:   Future    Standing Expiration Date:   12/14/2020    Order Specific Question:   Reason for Exam (SYMPTOM  OR DIAGNOSIS REQUIRED)    Answer:   left breast pain    Order Specific Question:   Preferred imaging location?    Answer:   [facility]  . US BREAST LTD UNI LEFT INC AXILLA    Standing Status:   Future    Standing Expiration Date:   12/14/2020    Order Specific Question:   Reason for Exam (SYMPTOM  OR DIAGNOSIS REQUIRED)    Answer:   left breast pain    Order Specific Question:   Preferred imaging location?    Answer:   [facility]  . MM Digital Screening    Standing Status:   Future    Standing Expiration Date:   12/14/2020    Order Specific Question:   Reason for Exam (SYMPTOM  OR DIAGNOSIS REQUIRED)    Answer:   screening    Order Specific Question:   Preferred imaging location?    Answer:   [facility]   All questions were answered. The patient knows to call the clinic with any problems, questions or concerns. No barriers to learning was detected. The total time spent in the appointment was 30 minutes.     Truitt Merle, MD 12/15/2019   I, Joslyn Devon, am acting as scribe for Truitt Merle, MD.   I have reviewed the above documentation for accuracy and completeness, and I agree with the above.

## 2019-12-11 ENCOUNTER — Other Ambulatory Visit: Payer: Self-pay | Admitting: Internal Medicine

## 2019-12-11 DIAGNOSIS — I10 Essential (primary) hypertension: Secondary | ICD-10-CM

## 2019-12-14 DIAGNOSIS — Z961 Presence of intraocular lens: Secondary | ICD-10-CM | POA: Diagnosis not present

## 2019-12-15 ENCOUNTER — Inpatient Hospital Stay: Payer: Medicare Other | Attending: Hematology | Admitting: Hematology

## 2019-12-15 ENCOUNTER — Other Ambulatory Visit: Payer: Self-pay

## 2019-12-15 ENCOUNTER — Encounter: Payer: Self-pay | Admitting: Hematology

## 2019-12-15 ENCOUNTER — Inpatient Hospital Stay: Payer: Medicare Other

## 2019-12-15 VITALS — BP 127/63 | HR 90 | Temp 97.9°F | Resp 19 | Ht 65.0 in | Wt 225.9 lb

## 2019-12-15 DIAGNOSIS — Z1231 Encounter for screening mammogram for malignant neoplasm of breast: Secondary | ICD-10-CM | POA: Diagnosis not present

## 2019-12-15 DIAGNOSIS — I10 Essential (primary) hypertension: Secondary | ICD-10-CM | POA: Insufficient documentation

## 2019-12-15 DIAGNOSIS — Z79811 Long term (current) use of aromatase inhibitors: Secondary | ICD-10-CM | POA: Diagnosis not present

## 2019-12-15 DIAGNOSIS — G8929 Other chronic pain: Secondary | ICD-10-CM | POA: Insufficient documentation

## 2019-12-15 DIAGNOSIS — Z90721 Acquired absence of ovaries, unilateral: Secondary | ICD-10-CM | POA: Insufficient documentation

## 2019-12-15 DIAGNOSIS — Z79899 Other long term (current) drug therapy: Secondary | ICD-10-CM | POA: Insufficient documentation

## 2019-12-15 DIAGNOSIS — Z170 Estrogen receptor positive status [ER+]: Secondary | ICD-10-CM

## 2019-12-15 DIAGNOSIS — N644 Mastodynia: Secondary | ICD-10-CM | POA: Diagnosis not present

## 2019-12-15 DIAGNOSIS — R079 Chest pain, unspecified: Secondary | ICD-10-CM | POA: Diagnosis not present

## 2019-12-15 DIAGNOSIS — C50212 Malignant neoplasm of upper-inner quadrant of left female breast: Secondary | ICD-10-CM

## 2019-12-15 DIAGNOSIS — M549 Dorsalgia, unspecified: Secondary | ICD-10-CM | POA: Insufficient documentation

## 2019-12-15 DIAGNOSIS — E119 Type 2 diabetes mellitus without complications: Secondary | ICD-10-CM | POA: Insufficient documentation

## 2019-12-15 DIAGNOSIS — R911 Solitary pulmonary nodule: Secondary | ICD-10-CM | POA: Diagnosis not present

## 2019-12-15 DIAGNOSIS — E785 Hyperlipidemia, unspecified: Secondary | ICD-10-CM | POA: Insufficient documentation

## 2019-12-15 LAB — CBC WITH DIFFERENTIAL/PLATELET
Abs Immature Granulocytes: 0.02 10*3/uL (ref 0.00–0.07)
Basophils Absolute: 0.1 10*3/uL (ref 0.0–0.1)
Basophils Relative: 1 %
Eosinophils Absolute: 0.2 10*3/uL (ref 0.0–0.5)
Eosinophils Relative: 3 %
HCT: 41.3 % (ref 36.0–46.0)
Hemoglobin: 13 g/dL (ref 12.0–15.0)
Immature Granulocytes: 0 %
Lymphocytes Relative: 28 %
Lymphs Abs: 2.1 10*3/uL (ref 0.7–4.0)
MCH: 28.1 pg (ref 26.0–34.0)
MCHC: 31.5 g/dL (ref 30.0–36.0)
MCV: 89.2 fL (ref 80.0–100.0)
Monocytes Absolute: 0.5 10*3/uL (ref 0.1–1.0)
Monocytes Relative: 6 %
Neutro Abs: 4.7 10*3/uL (ref 1.7–7.7)
Neutrophils Relative %: 62 %
Platelets: 277 10*3/uL (ref 150–400)
RBC: 4.63 MIL/uL (ref 3.87–5.11)
RDW: 13.8 % (ref 11.5–15.5)
WBC: 7.6 10*3/uL (ref 4.0–10.5)
nRBC: 0 % (ref 0.0–0.2)

## 2019-12-15 LAB — COMPREHENSIVE METABOLIC PANEL
ALT: 26 U/L (ref 0–44)
AST: 23 U/L (ref 15–41)
Albumin: 3.6 g/dL (ref 3.5–5.0)
Alkaline Phosphatase: 48 U/L (ref 38–126)
Anion gap: 11 (ref 5–15)
BUN: 22 mg/dL (ref 8–23)
CO2: 28 mmol/L (ref 22–32)
Calcium: 9.3 mg/dL (ref 8.9–10.3)
Chloride: 102 mmol/L (ref 98–111)
Creatinine, Ser: 1 mg/dL (ref 0.44–1.00)
GFR, Estimated: >60 mL/min (ref >60)
Glucose, Bld: 139 mg/dL — ABNORMAL HIGH (ref 70–99)
Potassium: 3.5 mmol/L (ref 3.5–5.1)
Sodium: 141 mmol/L (ref 135–145)
Total Bilirubin: 0.5 mg/dL (ref 0.3–1.2)
Total Protein: 7.3 g/dL (ref 6.5–8.1)

## 2019-12-15 MED ORDER — DULOXETINE HCL 20 MG PO CPEP: 20.0000 mg | ORAL_CAPSULE | Freq: Every day | ORAL | 0 refills | Status: DC

## 2019-12-17 ENCOUNTER — Other Ambulatory Visit: Payer: Self-pay | Admitting: Internal Medicine

## 2019-12-17 DIAGNOSIS — G8929 Other chronic pain: Secondary | ICD-10-CM

## 2019-12-17 MED ORDER — TRAMADOL HCL 50 MG PO TABS: 50.0000 mg | ORAL_TABLET | ORAL | 0 refills | Status: DC | PRN

## 2019-12-17 NOTE — Telephone Encounter (Signed)
Please review for refill. Refill not delegated per protocol.  Last refill: 11/15/19 #120 Next OV 02/16/20.

## 2019-12-17 NOTE — Telephone Encounter (Signed)
Medication Refill - Medication: Tramadol  Has the patient contacted their pharmacy? No. (Agent: If no, request that the patient contact the pharmacy for the refill.) (Agent: If yes, when and what did the pharmacy advise?)  Preferred Pharmacy (with phone number or street name): [id number] [address], [place]: Please be advised that RX refills may take up to 3 business days. We ask that you follow-up with your pharmacy.

## 2020-01-06 ENCOUNTER — Other Ambulatory Visit: Payer: Self-pay | Admitting: Hematology

## 2020-01-06 ENCOUNTER — Ambulatory Visit: Payer: Medicare Other

## 2020-01-06 ENCOUNTER — Other Ambulatory Visit: Payer: Self-pay

## 2020-01-06 ENCOUNTER — Ambulatory Visit
Admission: RE | Admit: 2020-01-06 | Discharge: 2020-01-06 | Disposition: A | Discharge status: Home / Self Care | Payer: Medicare Other | Source: Ambulatory Visit | Attending: Hematology | Admitting: Hematology

## 2020-01-06 DIAGNOSIS — C50212 Malignant neoplasm of upper-inner quadrant of left female breast: Secondary | ICD-10-CM

## 2020-01-06 DIAGNOSIS — R928 Other abnormal and inconclusive findings on diagnostic imaging of breast: Secondary | ICD-10-CM | POA: Diagnosis not present

## 2020-01-12 ENCOUNTER — Other Ambulatory Visit: Payer: Self-pay | Admitting: Internal Medicine

## 2020-01-12 DIAGNOSIS — G8929 Other chronic pain: Secondary | ICD-10-CM

## 2020-01-12 NOTE — Telephone Encounter (Signed)
Requested medication (s) are due for refill today:yes  Requested medication (s) are on the active medication list: yes  Last refill: 12/17/19  #120  0 refills  Future visit scheduled  yes 02/17/20  Notes to clinic: not delegated  Requested Prescriptions  Pending Prescriptions Disp Refills   traMADol (ULTRAM) 50 MG tablet [Pharmacy Med Name: TRAMADOL HCL 50 MG TABLET] 120 tablet 0    Sig: TAKE 1 TABLET BY MOUTH EVERY 4 HOURS AS NEEDED.      Not Delegated - Analgesics:  Opioid Agonists Failed - 01/12/2020  1:29 AM      Failed - This refill cannot be delegated      Passed - Urine Drug Screen completed in last 360 days      Passed - Valid encounter within last 6 months    Recent Outpatient Visits           2 months ago Need for influenza vaccination   [place], Jarome Matin, RPH-CPP   2 months ago Type 2 diabetes mellitus with obesity [facility])   [place] Karle Plumber B, MD   4 months ago Type 2 diabetes mellitus with obesity [facility])   [place], MD   7 months ago Type 2 diabetes mellitus with obesity [facility])   [place] Ladell Pier, MD   10 months ago Appointment canceled by hospital   [place], MD       Future Appointments             In 1 month Wynetta Emery Dalbert Batman, MD [place]

## 2020-01-13 ENCOUNTER — Telehealth: Payer: Self-pay | Admitting: Physical Medicine and Rehabilitation

## 2020-01-13 NOTE — Telephone Encounter (Signed)
Scheduled for 12/15 at 1400.

## 2020-01-13 NOTE — Telephone Encounter (Signed)
Patient called needing to schedule another appointment for an injection in her right hip. The number to contact patient is [phone]

## 2020-01-13 NOTE — Telephone Encounter (Signed)
ok 

## 2020-01-13 NOTE — Telephone Encounter (Signed)
Right hip injection 08/30/2019. Ok to repeat?

## 2020-01-17 ENCOUNTER — Telehealth: Payer: Self-pay | Admitting: Internal Medicine

## 2020-01-17 NOTE — Telephone Encounter (Signed)
Pt request refill  traMADol (ULTRAM) 50 MG tablet  [id number] [address] [place], [place] - [address] Phone:  [phone]  Fax:  [phone]

## 2020-01-19 ENCOUNTER — Ambulatory Visit (INDEPENDENT_AMBULATORY_CARE_PROVIDER_SITE_OTHER): Payer: Medicare Other | Admitting: Physical Medicine and Rehabilitation

## 2020-01-19 ENCOUNTER — Other Ambulatory Visit: Payer: Self-pay

## 2020-01-19 ENCOUNTER — Encounter: Payer: Self-pay | Admitting: Physical Medicine and Rehabilitation

## 2020-01-19 ENCOUNTER — Ambulatory Visit: Payer: Self-pay

## 2020-01-19 DIAGNOSIS — M25551 Pain in right hip: Secondary | ICD-10-CM

## 2020-01-19 MED ORDER — BUPIVACAINE HCL 0.25 % IJ SOLN
4.0000 mL | INTRAMUSCULAR | Status: AC | PRN
  Administered 2020-01-19: 4 mL via INTRA_ARTICULAR

## 2020-01-19 MED ORDER — TRIAMCINOLONE ACETONIDE 40 MG/ML IJ SUSP
60.0000 mg | INTRAMUSCULAR | Status: AC | PRN
  Administered 2020-01-19: 60 mg via INTRA_ARTICULAR

## 2020-01-19 NOTE — Telephone Encounter (Signed)
Contacted pt and left a detailed vm informing her that rx was sent on 12/8 and she will need to contact the pharmacy and if she has any questions or concerns to give [place] a call

## 2020-01-19 NOTE — Progress Notes (Signed)
Pt state lower back pain that travels down her right leg. Pt state bending makes the pain worse. Pt state taking pain meds help ease the pain. Pt has hx of inj on 08/30/19 pt state it was good until a month ago.  Numeric Pain Rating Scale and Functional Assessment Average Pain 10   In the last MONTH (on 0-10 scale) has pain interfered with the following?  1. General activity like being  able to carry out your everyday physical activities such as walking, climbing stairs, carrying groceries, or moving a chair?  Rating(10)

## 2020-01-19 NOTE — Progress Notes (Signed)
   [name] - 69 y.o. female MRN [id number]  Date of birth: Jul 30, 1950  Office Visit Note: Visit Date: 01/19/2020 PCP: Ladell Pier, MD Referred by: Ladell Pier, MD  Subjective: Chief Complaint  Patient presents with  . Lower Back - Pain  . Right Hip - Pain  . Right Leg - Pain   HPI:  Rachel Herman is a 69 y.o. female who comes in today for planned repeat Right anesthetic hip arthrogram with fluoroscopic guidance.  The patient has failed conservative care including home exercise, medications, time and activity modification. Prior injection gave more than 50% relief for several months. This injection will be diagnostic and hopefully therapeutic.  Please see requesting physician notes for further details and justification.  Referring: Dr. Landry Dyke Dean   ROS Otherwise per HPI.  Assessment & Plan: Visit Diagnoses: No diagnosis found.  Plan: No additional findings.   Meds & Orders: No orders of the defined types were placed in this encounter.   Orders Placed This Encounter  Procedures  . Large Joint Inj    Follow-up: Return for visit to requesting physician as needed.   Procedures: Large Joint Inj: R hip joint on 01/19/2020 2:01 PM Indications: diagnostic evaluation and pain Details: 22 G 3.5 in needle, fluoroscopy-guided anterior approach  Arthrogram: No  Medications: 4 mL bupivacaine 0.25 %; 60 mg triamcinolone acetonide 40 MG/ML Outcome: tolerated well, no immediate complications  There was excellent flow of contrast producing a partial arthrogram of the hip. The patient did have relief of symptoms during the anesthetic phase of the injection. Procedure, treatment alternatives, risks and benefits explained, specific risks discussed. Consent was given by the patient. Immediately prior to procedure a time out was called to verify the correct patient, procedure, equipment, support staff and site/side marked as required. Patient was prepped and draped  in the usual sterile fashion.          Clinical History: 08/25/2019 Hip xray: AP pelvis lateral bilateral hips reviewed. 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 Mild to moderate  arthritis present in the left hip. No acute fracture. No other bony  pelvic abnormality.     Objective:  VS:  HT:    WT:   BMI:     BP:   HR: bpm  TEMP: ( )  RESP:  Physical Exam   Imaging: No results found.

## 2020-01-19 NOTE — Addendum Note (Signed)
Addended by: Raymondo Band on: 01/19/2020 02:08 PM   Modules accepted: Orders

## 2020-01-20 ENCOUNTER — Other Ambulatory Visit: Payer: Self-pay | Admitting: Internal Medicine

## 2020-01-20 DIAGNOSIS — E119 Type 2 diabetes mellitus without complications: Secondary | ICD-10-CM

## 2020-02-03 ENCOUNTER — Other Ambulatory Visit: Payer: Self-pay | Admitting: Internal Medicine

## 2020-02-03 DIAGNOSIS — E1169 Type 2 diabetes mellitus with other specified complication: Secondary | ICD-10-CM

## 2020-02-03 DIAGNOSIS — E785 Hyperlipidemia, unspecified: Secondary | ICD-10-CM

## 2020-02-09 ENCOUNTER — Other Ambulatory Visit: Payer: Self-pay | Admitting: Internal Medicine

## 2020-02-09 DIAGNOSIS — I10 Essential (primary) hypertension: Secondary | ICD-10-CM

## 2020-02-12 ENCOUNTER — Other Ambulatory Visit: Payer: Self-pay | Admitting: Internal Medicine

## 2020-02-12 DIAGNOSIS — E119 Type 2 diabetes mellitus without complications: Secondary | ICD-10-CM

## 2020-02-14 NOTE — Progress Notes (Signed)
[place]   [phone] [phone]   Clinic Follow up Note   Patient Care Team: Ladell Pier, MD as PCP - General (Internal Medicine) Boykin Nearing, MD as Consulting Physician (Family Medicine) Erroll Luna, MD as Consulting Physician (General Surgery) Truitt Merle, MD as Consulting Physician (Hematology) Kyung Rudd, MD as Consulting Physician (Radiation Oncology)   I connected with [name] on 02/16/2020 at 10:00 AM EST by telephone visit and verified that I am speaking with the correct person using two identifiers.  I discussed the limitations, risks, security and privacy concerns of performing an evaluation and management service by telephone and the availability of in person appointments. I also discussed with the patient that there may be a patient responsible charge related to this service. The patient expressed understanding and agreed to proceed.   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 Ultrasound of the left axilla is negative.      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    12/29/2013 Surgery   Left lumpectomy with close (<0.1cm) inferior margin   03/02/2014 Surgery   Reexcision for close margin, path negative  for malignancy.   04/05/2014 - 05/20/2014 Radiation Therapy   adjuvant breast radiation    05/27/2014 Imaging   Bone density scan: normal    06/07/2014 -  Anti-estrogen oral therapy   Exemestane 25 mg daily, she stopped in Nov 2018 due to high copay. Change anastrozole on 05/09/2017    06/20/2015 Imaging   Bone scan 06/20/2015 IMPRESSION: No evidence of metastatic disease. Mild increased activity dorsal aspect right midfoot probable degenerative in nature. Clinical correlation is necessary.   02/29/2016 Mammogram   MM DIAG BREAST TOMO BIALTERAL 02/29/16 IMPRESSION: Stable left breast lumpectomy site. No mammographic evidence of malignancy in the bilateral breasts.   03/03/2017 Mammogram   IMPRESSION: 1. No mammographic evidence of malignancy in either breast. 2. Stable left breast posttreatment changes.      CURRENT THERAPY:  Exemestane 55m daily, started on 06/07/2014. She stopped in Nov 2018 due to high copay. Changed toanastrozole on 05/09/2017.   INTERVAL HISTORY:  DROBIN PETRAKISis here for a follow up. They identified themselves by birth date. She notes she started Cymbalta but still has some pain under her left breast. She notes this is exacerbated by lifting her left arm.    REVIEW OF SYSTEMS:   Constitutional: Denies fevers, chills or abnormal weight loss Eyes: Denies blurriness of vision Ears, nose, mouth, throat, and face: Denies mucositis or sore throat Respiratory: Denies cough, dyspnea or wheezes Cardiovascular: Denies palpitation, chest discomfort or lower extremity swelling Gastrointestinal:  Denies nausea, heartburn or change in bowel habits Skin: Denies abnormal skin rashes Lymphatics: Denies new lymphadenopathy or easy bruising Neurological:Denies  numbness, tingling or new weaknesses Behavioral/Psych: Mood is stable, no new changes  Breast: (+) Left breast pain All other systems were reviewed with the patient and are negative.  MEDICAL HISTORY:  Past  Medical History:  Diagnosis Date  . Breast cancer ([place]) 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  . Diabetes mellitus ([place]) 09/16/13   Diagnosed on 09/16/13; HgA1C was 7.2/metformin  . Dizziness   . GERD (gastroesophageal reflux disease)   . Headache   . Hyperlipidemia   . Hypertension 2014  . Low back pain   . Obesity, morbid, BMI 40.0-49.9 ([place])   . PONV (postoperative nausea and vomiting)   . S/P radiation therapy 04/05/14-05/20/14   left breast 60.4Gy totaldose    SURGICAL HISTORY: Past Surgical History:  Procedure Laterality Date  . ABDOMINAL HYSTERECTOMY N/A 09/20/2013   Procedure: HYSTERECTOMY ABDOMINAL;  Surgeon: Osborne Oman, MD;  Location: [facility];  Service: Gynecology;  Laterality: N/A;  . BREAST LUMPECTOMY Left 2015   radiation  . BREAST LUMPECTOMY WITH AXILLARY LYMPH NODE BIOPSY Left 12/29/13   left breast   . CATARACT EXTRACTION Right   . LAPAROSCOPIC ASSISTED VAGINAL HYSTERECTOMY  09/20/2013   Procedure: LAPAROSCOPIC ASSISTED VAGINAL HYSTERECTOMY;  Surgeon: Osborne Oman, MD;  Location: [facility];  Service: Gynecology;;  PT WAS EXAMINED WHILE UP IN [place] AND IT WAS DECIDED TO OPEN PT DUE TO LARGE MASS  . LUMBAR LAMINECTOMY/DECOMPRESSION MICRODISCECTOMY Right 12/14/2014   Procedure: Right Lumbar three-four microdiskectomy;  Surgeon: Newman Pies, MD;  Location: [facility];  Service: Neurosurgery;  Laterality: Right;  Right Lumbar three-four microdiskectomy  . RADIOACTIVE SEED GUIDED PARTIAL MASTECTOMY WITH AXILLARY SENTINEL LYMPH NODE BIOPSY Left 12/29/2013   Procedure: LEFT BREAST RADIOACTIVE SEED LOCALIZED LUMPECTOMY WITH SENTINEL LYMPH NODE MAPPING;  Surgeon: Erroll Luna, MD;  Location: [facility];  Service: General;  Laterality: Left;  . RE-EXCISION OF BREAST LUMPECTOMY Left 03/02/2014   Procedure: RE-EXCISION OF LEFT BREAST LUMPECTOMY;  Surgeon: Erroll Luna, MD;  Location: [facility];  Service: General;  Laterality:  Left;  . SALPINGOOPHORECTOMY  09/20/2013   Procedure: SALPINGO OOPHORECTOMY;  Surgeon: Osborne Oman, MD;  Location: [facility];  Service: Gynecology;;    I have reviewed the social history and family history with the patient and they are unchanged from previous note.  ALLERGIES:  has No Known Allergies.  MEDICATIONS:  Current Outpatient Medications  Medication Sig Dispense Refill  . acetaminophen (TYLENOL) 500 MG tablet Take 1,000 mg by mouth every 6 (six) hours as needed for headache (pain).    Marland Kitchen albuterol (VENTOLIN HFA) 108 (90 Base) MCG/ACT inhaler Inhale 2 puffs into the lungs every 6 (six) hours as needed for wheezing or shortness of breath. 8 g 3  . anastrozole (ARIMIDEX) 1 MG tablet TAKE 1 TABLET BY MOUTH EVERY DAY 90 tablet 3  . Ascorbic Acid (VITAMIN C) 1000 MG tablet Take 1,000 mg by mouth daily.    Marland Kitchen aspirin EC 325 MG tablet Take 325 mg by mouth daily.    . Blood Glucose Monitoring Suppl (ONE TOUCH ULTRA MINI) w/Device KIT USE AS DIRECTED ONCE DAILY DX CODE E11.9 1 each 0  . carvedilol (COREG) 3.125 MG tablet TAKE 1 TABLET BY MOUTH 2 TIMES DAILY WITH A MEAL. 180 tablet 0  . cloNIDine (CATAPRES) 0.1 MG tablet TAKE 1 TABLET (0.1 MG TOTAL) BY MOUTH 2 (TWO) TIMES DAILY. 180 tablet 1  . CVS SENNA PLUS 8.6-50 MG tablet TAKE 2 TABLETS BY  MOUTH AT BEDTIME AS NEEDED FOR MILD CONSTIPATION (Patient taking differently: Take 1 tablet by mouth at bedtime.) 60 tablet 11  . DULoxetine (CYMBALTA) 20 MG capsule TAKE 1 CAPSULE BY MOUTH EVERY DAY 90 capsule 1  . gabapentin (NEURONTIN) 300 MG capsule TAKE 2 CAPSULES (600 MG) BY MOUTH 2 TIMES DAILY. 360 capsule 3  . hydrochlorothiazide (HYDRODIURIL) 25 MG tablet TAKE 1 TABLET BY MOUTH EVERYDAY AT BEDTIME 90 tablet 0  . metFORMIN (GLUCOPHAGE) 500 MG tablet TAKE 1 & 1/2 TABLETS (750 MG TOTAL) BY MOUTH 2 (TWO) TIMES DAILY. 270 tablet 0  . Multiple Vitamins-Minerals (MULTIVITAMIN WITH MINERALS) tablet Take 1 tablet by mouth daily. Reported on 03/13/2015    .  omeprazole (PRILOSEC) 20 MG capsule TAKE 1 CAPSULE BY MOUTH EVERY DAY 90 capsule 0  . ONETOUCH DELICA LANCETS 40H MISC USE AS DIRECTED ONCE DAILY DX CODE E11.9 100 each 12  . ONETOUCH ULTRA test strip TEST ONCE DAILY AS DIRECTED E11.9 100 strip 12  . potassium chloride (KLOR-CON) 10 MEQ tablet TAKE 1 TABLET BY MOUTH EVERY DAY 90 tablet 3  . pravastatin (PRAVACHOL) 20 MG tablet TAKE 1 TABLET BY MOUTH EVERY DAY 90 tablet 0  . tizanidine (ZANAFLEX) 2 MG capsule Take 2 mg by mouth every 6 (six) hours as needed for muscle spasms. Take 2 tablets every 6 hours prn    . traMADol (ULTRAM) 50 MG tablet Take 1 tablet (50 mg total) by mouth every 4 (four) hours as needed. To fill on or after 01/15/2020. 120 tablet 0   No current facility-administered medications for this visit.    PHYSICAL EXAMINATION: ECOG PERFORMANCE STATUS: 1 - Symptomatic but completely ambulatory  No vitals taken today, Exam not performed today   LABORATORY DATA:  I have reviewed the data as listed CBC Latest Ref Rng & Units 12/15/2019 03/17/2019 10/19/2018  WBC 4.0 - 10.5 K/uL 7.6 7.0 8.9  Hemoglobin 12.0 - 15.0 g/dL 13.0 12.2 13.5  Hematocrit 36.0 - 46.0 % 41.3 38.6 43.6  Platelets 150 - 400 K/uL 277 241 306     CMP Latest Ref Rng & Units 12/15/2019 10/18/2019 03/17/2019  Glucose 70 - 99 mg/dL 139(H) 104(H) 182(H)  BUN 8 - 23 mg/dL _0 Creatinine 0.44 - 1.00 mg/dL 1.00 0.83 1.07(H)  Sodium 135 - 145 mmol/L 141 141 143  Potassium 3.5 - 5.1 mmol/L 3.5 4.6 3.7  Chloride 98 - 111 mmol/L 102 100 104  CO2 22 - 32 mmol/L _1 Calcium 8.9 - 10.3 mg/dL 9.3 9.7 9.4  Total Protein 6.5 - 8.1 g/dL 7.3 - 7.2  Total Bilirubin 0.3 - 1.2 mg/dL 0.5 - 0.4  Alkaline Phos 38 - 126 U/L 48 - 51  AST 15 - 41 U/L 23 - 36  ALT 0 - 44 U/L 26 - 39      RADIOGRAPHIC STUDIES: I have personally reviewed the radiological images as listed and agreed with the findings in the report. No results found.   ASSESSMENT & PLAN:  Rachel Herman is a 70 y.o. 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 Grade 2, Ki-67 73%. -She was diagnosed in 12/2013.She is status post complete surgical resection with lumpectomy, 12/29/13 -I previously discussed the Oncotype DX result with her and her husband. The recurrence score is 23, with the predicted 10 year recurrence risk of 15% with tamoxifen alone. The  benefit of chemotherapy is uncertain in this intermediate risk group.Adjuvant chemo was not recommended -She started adjuvant antiestrogen therapy withExemestanein 06/2014, but stopped in 12/2016 due to high copay. She started Anastrozolein 05/2017 and toleratesmoderatelywell. -Continue surveillance. Screening mammogram in 03/2020 -Continue Anastrozole. Plan to complete in 2 months  -F/u in 2 months   2. Left breast pain  -Since the end of August 2021 she started having sharp breast pain left radiating to mid chest and mildly in right breast. She notes this has worsened and more diffuse in her left breast and will last 2 minutes at a time intermittently several times a day.  -She notes it hurts to raise her left arm and to breath deep and quickly. There is no palpable mass on 12/2019 exam. I suspect this is related to prior breast surgery. Will proceed with mammogram on 03/2019.  -She has tried Cymbalta, her pain improved but still persists. She is willing to continue Cymbalta until next follow up.   3.Bone health -Her bone density scan was normal in 05/2014, Normal bone density, T score -0.3 on 03/03/17 and normal with -0.5 lowest T-score on 04/01/19,  -She will continue calcium and vitamin D.  4. Hypertension, diabetes, dyslipidemia, chronic low back pain, arthritis, Morbid Obesity  -She will continue follow-up with her primary care physician and ortho Dr Marlou Sa -I encouraged her to manage her weight.Weight is  stable. -For recent recurrent burning sensation for her right leg she can f/u with Dr Marlou Sa for another injection.  -For her back pain, she is on Gabapentin 661m BID and Tramadol 2-3 times a day.    5.Right lung nodule, likely benign -She had a CT abdomen and pelvis in June 2016, which incidentally found a 4 mm nodule in the right lower lobe. I personally reviewed the image.  -She had CT chest on 08/18/2015 for chest pain, which showed stable 539mright lung base subpleural nodule, likely benign, no need to follow-up since she is a never smoker.  6. Thyroid Benign follicular nodule -Her CT scan revealed a 1.7 cm right thyroid nodule. -Ultrasound guided thyroid nodule biopsy on 05/09/2015 showed benign follicular nodule    PLAN: -Lab and F/u in 2 months  -Screening b/l mammogram in 03/2020 -Continue anastrozole. Plan to complete on next visit in 2 months    No problem-specific Assessment & Plan notes found for this encounter.   No orders of the defined types were placed in this encounter.  I discussed the assessment and treatment plan with the patient. The patient was provided an opportunity to ask questions and all were answered. The patient agreed with the plan and demonstrated an understanding of the instructions.  The patient was advised to call back or seek an in-person evaluation if the symptoms worsen or if the condition fails to improve as anticipated.  The total time spent in the appointment was 12 minutes.    YaTruitt MerleMD 02/16/2020   I, AmJoslyn Devonam acting as scribe for YaTruitt MerleMD.   I have reviewed the above documentation for accuracy and completeness, and I agree with the above.

## 2020-02-16 ENCOUNTER — Inpatient Hospital Stay: Payer: Medicare Other | Attending: Hematology | Admitting: Hematology

## 2020-02-16 ENCOUNTER — Encounter: Payer: Self-pay | Admitting: Hematology

## 2020-02-16 DIAGNOSIS — C50212 Malignant neoplasm of upper-inner quadrant of left female breast: Secondary | ICD-10-CM | POA: Diagnosis not present

## 2020-02-16 DIAGNOSIS — I10 Essential (primary) hypertension: Secondary | ICD-10-CM | POA: Diagnosis not present

## 2020-02-16 DIAGNOSIS — Z170 Estrogen receptor positive status [ER+]: Secondary | ICD-10-CM

## 2020-02-17 ENCOUNTER — Other Ambulatory Visit: Payer: Self-pay

## 2020-02-17 ENCOUNTER — Ambulatory Visit: Payer: Medicare Other | Attending: Internal Medicine | Admitting: Internal Medicine

## 2020-02-17 DIAGNOSIS — E1169 Type 2 diabetes mellitus with other specified complication: Secondary | ICD-10-CM | POA: Diagnosis not present

## 2020-02-17 DIAGNOSIS — G8929 Other chronic pain: Secondary | ICD-10-CM

## 2020-02-17 DIAGNOSIS — Z853 Personal history of malignant neoplasm of breast: Secondary | ICD-10-CM | POA: Diagnosis not present

## 2020-02-17 DIAGNOSIS — I10 Essential (primary) hypertension: Secondary | ICD-10-CM

## 2020-02-17 DIAGNOSIS — M5416 Radiculopathy, lumbar region: Secondary | ICD-10-CM | POA: Diagnosis not present

## 2020-02-17 DIAGNOSIS — E669 Obesity, unspecified: Secondary | ICD-10-CM

## 2020-02-17 MED ORDER — TRAMADOL HCL 50 MG PO TABS
50.0000 mg | ORAL_TABLET | ORAL | 0 refills | Status: DC | PRN
Start: 2020-02-17 — End: 2020-03-22

## 2020-02-17 MED ORDER — CARVEDILOL 6.25 MG PO TABS
ORAL_TABLET | ORAL | 5 refills | Status: DC
Start: 2020-02-17 — End: 2020-08-16

## 2020-02-17 NOTE — Progress Notes (Signed)
Virtual Visit via Telephone Note  I connected with Rachel Herman on 02/17/20 at 10:47 a.m by telephone and verified that I am speaking with the correct person using two identifiers.  Location: Patient: home Provider: office  The patient, my CMA Ms. Rachel Herman and myself participated in this encounter. I discussed the limitations, risks, security and privacy concerns of performing an evaluation and management service by telephone and the availability of in person appointments. I also discussed with the patient that there may be a patient responsible charge related to this service. The patient expressed understanding and agreed to proceed.   History of Present Illness: Pt with hx of HTN, DM, HL, DJD lumbar and spinal stenosiswith hx of laminectomy 2016, obesity, depression,DuctalCA LT s/plumpectomy/XRT (2015 and 2016, followed by Dr. Annamaria Boots), vaginal prolapse, CKD 3. Patient last evaluated 10/18/2019.  Today's visit is for chronic disease management.  Chronic LBP:  Some pain in legs: LT from thigh to knee and on RT side from thigh down. Had inj to RT hip jt 01/2020 by Dr. Ernestina Patches.  Did not help as much as it did in past. Still taking Tramadol which she finds helpful.  Pain goes from 10/10 to 6/10.  Still independent in ADLS. No falls.   DM:  Checks BS daily.  Highest was 167.  Most of the times readings in 115-120.  Checks 1-2 x a day -more picky with eating habits.  Decrease appetite.  When she bends over, she gets hurting in her stomach with nausea that can last 3-4 days. Compliant with Metformin.  HTN: checks BP QOD  After taking meds her most recent readings were 148/86, 134/84, 150/88, 142/80, pulse in 80s Compliant with Coreg, HCTZ and Clonidine -limits salt in foods.  No CP, some SOB if she exerts self.  No LE edema  Recently saw her oncologist Dr. Annamaria Boots for follow-up on history of breast cancer.  She was having some pain in the left breast.  She will be getting a mammogram next  month.  Received COVID Booster on 02/15/2020  Last Medicare Wellness 09/14/19 Outpatient Encounter Medications as of 02/17/2020  Medication Sig  . acetaminophen (TYLENOL) 500 MG tablet Take 1,000 mg by mouth every 6 (six) hours as needed for headache (pain).  Marland Kitchen albuterol (VENTOLIN HFA) 108 (90 Base) MCG/ACT inhaler Inhale 2 puffs into the lungs every 6 (six) hours as needed for wheezing or shortness of breath.  . anastrozole (ARIMIDEX) 1 MG tablet TAKE 1 TABLET BY MOUTH EVERY DAY  . Ascorbic Acid (VITAMIN C) 1000 MG tablet Take 1,000 mg by mouth daily.  Marland Kitchen aspirin EC 325 MG tablet Take 325 mg by mouth daily.  . Blood Glucose Monitoring Suppl (ONE TOUCH ULTRA MINI) w/Device KIT USE AS DIRECTED ONCE DAILY DX CODE E11.9  . carvedilol (COREG) 3.125 MG tablet TAKE 1 TABLET BY MOUTH 2 TIMES DAILY WITH A MEAL.  . cloNIDine (CATAPRES) 0.1 MG tablet TAKE 1 TABLET (0.1 MG TOTAL) BY MOUTH 2 (TWO) TIMES DAILY.  . CVS SENNA PLUS 8.6-50 MG tablet TAKE 2 TABLETS BY MOUTH AT BEDTIME AS NEEDED FOR MILD CONSTIPATION (Patient taking differently: Take 1 tablet by mouth at bedtime.)  . DULoxetine (CYMBALTA) 20 MG capsule TAKE 1 CAPSULE BY MOUTH EVERY DAY  . gabapentin (NEURONTIN) 300 MG capsule TAKE 2 CAPSULES (600 MG) BY MOUTH 2 TIMES DAILY.  . hydrochlorothiazide (HYDRODIURIL) 25 MG tablet TAKE 1 TABLET BY MOUTH EVERYDAY AT BEDTIME  . metFORMIN (GLUCOPHAGE) 500 MG tablet TAKE 1 & 1/2  TABLETS (750 MG TOTAL) BY MOUTH 2 (TWO) TIMES DAILY.  . Multiple Vitamins-Minerals (MULTIVITAMIN WITH MINERALS) tablet Take 1 tablet by mouth daily. Reported on 03/13/2015  . omeprazole (PRILOSEC) 20 MG capsule TAKE 1 CAPSULE BY MOUTH EVERY DAY  . ONETOUCH DELICA LANCETS 84O MISC USE AS DIRECTED ONCE DAILY DX CODE E11.9  . ONETOUCH ULTRA test strip TEST ONCE DAILY AS DIRECTED E11.9  . potassium chloride (KLOR-CON) 10 MEQ tablet TAKE 1 TABLET BY MOUTH EVERY DAY  . pravastatin (PRAVACHOL) 20 MG tablet TAKE 1 TABLET BY MOUTH EVERY DAY  .  tizanidine (ZANAFLEX) 2 MG capsule Take 2 mg by mouth every 6 (six) hours as needed for muscle spasms. Take 2 tablets every 6 hours prn  . traMADol (ULTRAM) 50 MG tablet Take 1 tablet (50 mg total) by mouth every 4 (four) hours as needed. To fill on or after 01/15/2020.   No facility-administered encounter medications on file as of 02/17/2020.      Observations/Objective: No direct observation done as this was a telephone encounter.  Assessment and Plan: 1. Type 2 diabetes mellitus with obesity ([place]) Reported blood sugars are at goal.  Continue metformin.  Healthy eating habits encouraged.  Encourage her to move as much as her back will allow. She will follow-up with me if she continues to have pain in the epigastric region of the abdomen when she bends over to be evaluated and ruled out for hernia. - Microalbumin / creatinine urine ratio; Future  2. Essential hypertension Readings not at goal.  I recommend increasing the carvedilol to 6.25 mg twice a day.  Continue to check blood pressure.  Follow-up with clinical pharmacist in 2 weeks for repeat blood pressure check.  Bring blood pressure log with her. - carvedilol (COREG) 6.25 MG tablet; TAKE 1 TABLET BY MOUTH 2 TIMES DAILY WITH A MEAL.  Dispense: 60 tablet; Refill: 5  3. Chronic radicular pain of lower back Patient reports some relief from pain with use of tramadol.  She has had other measures in the past including injections to the back.  She had seen neurosurgeon in follow-up I think sometime last year and no additional surgery recommended.  She is tolerating tramadol without significant side effects.  [place] controlled substance reporting system reviewed and is appropriate.  Controlled substance prescribing agreement was updated 10/2019.  Last urine drug screen was 10/2019. - traMADol (ULTRAM) 50 MG tablet; Take 1 tablet (50 mg total) by mouth every 4 (four) hours as needed. To fill on or after 01/15/2020.  Dispense: 120 tablet;  Refill: 0  4. History of left breast cancer Followed by oncology   Follow Up Instructions: 3-4 mths F/u with clinical pharmacist in 2 weeks for repeat blood pressure check.   I discussed the assessment and treatment plan with the patient. The patient was provided an opportunity to ask questions and all were answered. The patient agreed with the plan and demonstrated an understanding of the instructions.   The patient was advised to call back or seek an in-person evaluation if the symptoms worsen or if the condition fails to improve as anticipated.  I provided 14 minutes of non-face-to-face time during this encounter.   Karle Plumber, MD

## 2020-02-18 ENCOUNTER — Telehealth: Payer: Self-pay | Admitting: Internal Medicine

## 2020-02-18 ENCOUNTER — Other Ambulatory Visit: Payer: Self-pay | Admitting: Internal Medicine

## 2020-02-18 DIAGNOSIS — E119 Type 2 diabetes mellitus without complications: Secondary | ICD-10-CM

## 2020-02-18 DIAGNOSIS — I10 Essential (primary) hypertension: Secondary | ICD-10-CM

## 2020-02-18 DIAGNOSIS — E1169 Type 2 diabetes mellitus with other specified complication: Secondary | ICD-10-CM

## 2020-02-18 DIAGNOSIS — K219 Gastro-esophageal reflux disease without esophagitis: Secondary | ICD-10-CM

## 2020-02-18 NOTE — Telephone Encounter (Signed)
Called patient and LVM letting her know I was calling from [facility] to schedule a few follow up appointments from her recent virtual visit. Advised patient to call back and schedule [phone].   Patient needs a BP check with CPP around 03/02/2020.  Patient needs a 4 month follow up with Dr. Wynetta Emery around 06/16/2020.

## 2020-03-02 ENCOUNTER — Other Ambulatory Visit: Payer: Self-pay | Admitting: Internal Medicine

## 2020-03-02 DIAGNOSIS — E119 Type 2 diabetes mellitus without complications: Secondary | ICD-10-CM

## 2020-03-15 ENCOUNTER — Other Ambulatory Visit: Payer: Self-pay | Admitting: Internal Medicine

## 2020-03-15 DIAGNOSIS — E1169 Type 2 diabetes mellitus with other specified complication: Secondary | ICD-10-CM

## 2020-03-15 DIAGNOSIS — I10 Essential (primary) hypertension: Secondary | ICD-10-CM

## 2020-03-15 DIAGNOSIS — E785 Hyperlipidemia, unspecified: Secondary | ICD-10-CM

## 2020-03-22 ENCOUNTER — Other Ambulatory Visit: Payer: Self-pay | Admitting: Internal Medicine

## 2020-03-22 ENCOUNTER — Telehealth: Payer: Self-pay | Admitting: Internal Medicine

## 2020-03-22 DIAGNOSIS — G8929 Other chronic pain: Secondary | ICD-10-CM

## 2020-03-22 DIAGNOSIS — M5416 Radiculopathy, lumbar region: Secondary | ICD-10-CM

## 2020-03-22 MED ORDER — TRAMADOL HCL 50 MG PO TABS
50.0000 mg | ORAL_TABLET | ORAL | 0 refills | Status: DC | PRN
Start: 2020-03-22 — End: 2020-04-18

## 2020-03-22 NOTE — Telephone Encounter (Signed)
Copied from [place] [phone]. Topic: Quick Communication - Rx Refill/Question >> Mar 22, 2020 11:37 AM Antonieta Iba C wrote: Medication: traMADol (ULTRAM) 50 MG tablet  Has the patient contacted their pharmacy? yes (Agent: If no, request that the patient contact the pharmacy for the refill.) (Agent: If yes, when and what did the pharmacy advise?) pt was advised to contact PCP  Preferred Pharmacy (with phone number or street name): [id number] [place], [place] - [address]  Phone:  [phone] Fax:  [phone]     Agent: Please be advised that RX refills may take up to 3 business days. We ask that you follow-up with your pharmacy.

## 2020-03-22 NOTE — Telephone Encounter (Signed)
Requested medication (s) are due for refill today - yes  Requested medication (s) are on the active medication list -yes  Future visit scheduled -no  Last refill: 02/17/20  Notes to clinic: Request RF of non delegated Rx  Requested Prescriptions  Pending Prescriptions Disp Refills   traMADol (ULTRAM) 50 MG tablet 120 tablet 0    Sig: Take 1 tablet (50 mg total) by mouth every 4 (four) hours as needed. To fill on or after 01/15/2020.      Not Delegated - Analgesics:  Opioid Agonists Failed - 03/22/2020 11:44 AM      Failed - This refill cannot be delegated      Passed - Urine Drug Screen completed in last 360 days      Passed - Valid encounter within last 6 months    Recent Outpatient Visits           1 month ago Type 2 diabetes mellitus with obesity ([place])   [place] Ladell Pier, MD   5 months ago Need for influenza vaccination   Hod[place], Annie Main L, RPH-CPP   5 months ago Type 2 diabetes mellitus with obesity [facility])   [place] Karle Plumber B, MD   7 months ago Type 2 diabetes mellitus with obesity [facility])   [place] Karle Plumber B, MD   9 months ago Type 2 diabetes mellitus with obesity [facility])   [place] Ladell Pier, MD       Future Appointments             In 2 months Ladell Pier, MD [place]                 Requested Prescriptions  Pending Prescriptions Disp Refills   traMADol (ULTRAM) 50 MG tablet 120 tablet 0    Sig: Take 1 tablet (50 mg total) by mouth every 4 (four) hours as needed. To fill on or after 01/15/2020.      Not Delegated - Analgesics:  Opioid Agonists Failed - 03/22/2020 11:44 AM      Failed - This refill cannot be delegated      Passed - Urine Drug Screen completed in last 360 days      Passed - Valid  encounter within last 6 months    Recent Outpatient Visits           1 month ago Type 2 diabetes mellitus with obesity ([place])   [place] Ladell Pier, MD   5 months ago Need for influenza vaccination   [place], Annie Main L, RPH-CPP   5 months ago Type 2 diabetes mellitus with obesity [facility])   [place] Karle Plumber B, MD   7 months ago Type 2 diabetes mellitus with obesity [facility])   [place] Karle Plumber B, MD   9 months ago Type 2 diabetes mellitus with obesity [facility])   [place], MD       Future Appointments             In 2 months Wynetta Emery Dalbert Batman, MD [place]

## 2020-03-27 ENCOUNTER — Other Ambulatory Visit: Payer: Self-pay | Admitting: Hematology

## 2020-03-27 DIAGNOSIS — Z1231 Encounter for screening mammogram for malignant neoplasm of breast: Secondary | ICD-10-CM

## 2020-03-28 ENCOUNTER — Ambulatory Visit
Admission: RE | Admit: 2020-03-28 | Discharge: 2020-03-28 | Disposition: A | Discharge status: Home / Self Care | Payer: Medicare Other | Source: Ambulatory Visit | Attending: Hematology | Admitting: Hematology

## 2020-03-28 ENCOUNTER — Other Ambulatory Visit: Payer: Self-pay

## 2020-03-28 DIAGNOSIS — Z1231 Encounter for screening mammogram for malignant neoplasm of breast: Secondary | ICD-10-CM

## 2020-04-07 NOTE — Progress Notes (Signed)
[place]   [phone] [phone]   Clinic Follow up Note   Patient Care Team: Ladell Pier, MD as PCP - General (Internal Medicine) Boykin Nearing, MD as Consulting Physician (Family Medicine) Erroll Luna, MD as Consulting Physician (General Surgery) Truitt Merle, MD as Consulting Physician (Hematology) Kyung Rudd, MD as Consulting Physician (Radiation Oncology)  Date of Service:  04/12/2020  CHIEF COMPLAINT: Follow upleftbreast cancer  SUMMARY OF ONCOLOGIC HISTORY: Oncology History Overview Note  Malignant neoplasm of upper inner quadrant of female breast   Staging form: Breast, AJCC 7th Edition     Clinical: Stage IA (T1c, N0, M0) - Unsigned     Pathologic: No stage assigned - Unsigned     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 Ultrasound of the left axilla is negative.      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    12/29/2013 Surgery   Left lumpectomy with close (<0.1cm) inferior margin   03/02/2014 Surgery   Reexcision for close margin, path negative for malignancy.   04/05/2014 - 05/20/2014 Radiation Therapy   adjuvant breast radiation    05/27/2014 Imaging   Bone density scan: normal    06/07/2014 - 03/2020 Anti-estrogen oral therapy   Exemestane $RemoveBef'25mg'cfUPhJXoSh$  daily, started on 06/07/2014. She stopped in Nov 2018 due to high copay. Changed to anastrozole on 05/09/2017. Completed in 03/2020.    06/20/2015 Imaging   Bone scan 06/20/2015 IMPRESSION: No evidence of metastatic disease. Mild increased activity dorsal aspect right midfoot probable degenerative in nature. Clinical correlation is necessary.   02/29/2016 Mammogram   MM DIAG BREAST TOMO BIALTERAL  02/29/16 IMPRESSION: Stable left breast lumpectomy site. No mammographic evidence of malignancy in the bilateral breasts.   03/03/2017 Mammogram   IMPRESSION: 1. No mammographic evidence of malignancy in either breast. 2. Stable left breast posttreatment changes.      CURRENT THERAPY:  Surveillance   INTERVAL HISTORY:  Rachel Herman is here for a follow up of left breast cancer. She presents to the clinic alone. She notes she is doing fairly well. She notes she still has left breast pain. She has this pain every 2-3 days and will last a few seconds at a time. She has Cymbalta for this, since 3-4 months ago. This has helped her pain and wants to continue. She is also on Gabapentin $RemoveBefor'600mg'ZqjfaFPPUXXv$  in the AM and $Remo'900mg'eOIUc$  at night. She notes known right leg pain for the past 2 years, stable. She has received injection before without help. She denies any new pain. She declined PT. She is able to ambulate without cane.  She notes she stopped Anastrozole in 03/2020. She notes her hot flashes have resolved since stopping. She notes her DM and HTN is controlled.     REVIEW OF SYSTEMS:   Constitutional: Denies fevers, chills or abnormal weight loss  Eyes: Denies blurriness of vision Ears, nose, mouth, throat, and face: Denies mucositis or sore throat Respiratory: Denies cough, dyspnea or wheezes Cardiovascular: Denies palpitation, chest discomfort or lower extremity swelling Gastrointestinal:  Denies nausea, heartburn or change in bowel habits Skin: Denies abnormal skin rashes MSK: (+) Right leg pain Lymphatics: Denies new lymphadenopathy or easy bruising Neurological:Denies numbness, tingling or new weaknesses Behavioral/Psych: Mood is stable,  no new changes  Breast: (+) Left breast shooting pain s/p surgery  All other systems were reviewed with the patient and are negative.  MEDICAL HISTORY:  Past Medical History:  Diagnosis Date  . 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/09/13   left breast Invasive Ductal  Carcinoma  . Diabetes mellitus (HCC) 09/16/13   Diagnosed on 09/16/13; HgA1C was 7.2/metformin  . Dizziness   . GERD (gastroesophageal reflux disease)   . Headache   . Hyperlipidemia   . Hypertension 2014  . Low back pain   . Obesity, morbid, BMI 40.0-49.9 (HCC)   . PONV (postoperative nausea and vomiting)   . S/P radiation therapy 04/05/14-05/20/14   left breast 60.4Gy totaldose    SURGICAL HISTORY: Past Surgical History:  Procedure Laterality Date  . ABDOMINAL HYSTERECTOMY N/A 09/20/2013   Procedure: HYSTERECTOMY ABDOMINAL;  Surgeon: Tereso Newcomer, MD;  Location: [facility];  Service: Gynecology;  Laterality: N/A;  . BREAST LUMPECTOMY Left 2015   radiation  . BREAST LUMPECTOMY WITH AXILLARY LYMPH NODE BIOPSY Left 12/29/13   left breast   . CATARACT EXTRACTION Right   . LAPAROSCOPIC ASSISTED VAGINAL HYSTERECTOMY  09/20/2013   Procedure: LAPAROSCOPIC ASSISTED VAGINAL HYSTERECTOMY;  Surgeon: Tereso Newcomer, MD;  Location: [facility];  Service: Gynecology;;  PT WAS EXAMINED WHILE UP IN [place] AND IT WAS DECIDED TO OPEN PT DUE TO LARGE MASS  . LUMBAR LAMINECTOMY/DECOMPRESSION MICRODISCECTOMY Right 12/14/2014   Procedure: Right Lumbar three-four microdiskectomy;  Surgeon: Tressie Stalker, MD;  Location: [facility];  Service: Neurosurgery;  Laterality: Right;  Right Lumbar three-four microdiskectomy  . RADIOACTIVE SEED GUIDED PARTIAL MASTECTOMY WITH AXILLARY SENTINEL LYMPH NODE BIOPSY Left 12/29/2013   Procedure: LEFT BREAST RADIOACTIVE SEED LOCALIZED LUMPECTOMY WITH SENTINEL LYMPH NODE MAPPING;  Surgeon: Harriette Bouillon, MD;  Location: [facility];  Service: General;  Laterality: Left;  . RE-EXCISION OF BREAST LUMPECTOMY Left 03/02/2014   Procedure: RE-EXCISION OF LEFT BREAST LUMPECTOMY;  Surgeon: Harriette Bouillon, MD;  Location: [facility];  Service: General;  Laterality: Left;  . SALPINGOOPHORECTOMY  09/20/2013   Procedure: SALPINGO OOPHORECTOMY;  Surgeon: Tereso Newcomer, MD;  Location: [facility];  Service: Gynecology;;    I have reviewed the social history and family history with the patient and they are unchanged from previous note.  ALLERGIES:  has No Known Allergies.  MEDICATIONS:  Current Outpatient Medications  Medication Sig Dispense Refill  . acetaminophen (TYLENOL) 500 MG tablet Take 1,000 mg by mouth every 6 (six) hours as needed for headache (pain).    Marland Kitchen albuterol (VENTOLIN HFA) 108 (90 Base) MCG/ACT inhaler Inhale 2 puffs into the lungs every 6 (six) hours as needed for wheezing or shortness of breath. 8 g 3  . anastrozole (ARIMIDEX) 1 MG tablet TAKE 1 TABLET BY MOUTH EVERY DAY 90 tablet 3  . Ascorbic Acid (VITAMIN C) 1000 MG tablet Take 1,000 mg by mouth daily.    Marland Kitchen aspirin EC 325 MG tablet Take 325 mg by mouth daily.    . Blood Glucose Monitoring Suppl (ONE TOUCH ULTRA MINI) w/Device KIT USE AS DIRECTED ONCE DAILY DX CODE E11.9 1 each 0  . carvedilol (COREG) 6.25 MG tablet TAKE 1 TABLET BY MOUTH 2 TIMES DAILY WITH A MEAL. 60 tablet 5  . cloNIDine (CATAPRES) 0.1 MG tablet TAKE 1 TABLET (0.1 MG TOTAL) BY MOUTH 2 (TWO) TIMES DAILY. 180 tablet 1  . [facility]S SENNA PLUS 8.6-50 MG tablet TAKE 2 TABLETS BY MOUTH AT BEDTIME AS NEEDED  FOR MILD CONSTIPATION (Patient taking differently: Take 1 tablet by mouth at bedtime.) 60 tablet 11  . DULoxetine (CYMBALTA) 20 MG capsule TAKE 1 CAPSULE BY MOUTH EVERY DAY 90 capsule 1  . gabapentin (NEURONTIN) 300 MG capsule TAKE 2 CAPSULES (600 MG) BY MOUTH 2 TIMES DAILY. 360 capsule 3  . hydrochlorothiazide (HYDRODIURIL) 25 MG tablet TAKE 1 TABLET BY MOUTH EVERYDAY AT BEDTIME 90 tablet 1  . metFORMIN (GLUCOPHAGE) 500 MG tablet TAKE 1 & 1/2 TABLETS (750 MG TOTAL) BY MOUTH 2 (TWO) TIMES DAILY. 270 tablet 0  . Multiple Vitamins-Minerals (MULTIVITAMIN WITH MINERALS) tablet Take 1 tablet by mouth daily. Reported on 03/13/2015    . omeprazole (PRILOSEC) 20 MG capsule TAKE 1 CAPSULE BY MOUTH EVERY DAY 90 capsule 1  . ONETOUCH  DELICA LANCETS 27P MISC USE AS DIRECTED ONCE DAILY DX CODE E11.9 100 each 12  . ONETOUCH ULTRA test strip TEST ONCE DAILY AS DIRECTED E11.9 100 strip 12  . potassium chloride (KLOR-CON) 10 MEQ tablet TAKE 1 TABLET BY MOUTH EVERY DAY 90 tablet 3  . pravastatin (PRAVACHOL) 20 MG tablet TAKE 1 TABLET BY MOUTH EVERY DAY 90 tablet 1  . tizanidine (ZANAFLEX) 2 MG capsule Take 2 mg by mouth every 6 (six) hours as needed for muscle spasms. Take 2 tablets every 6 hours prn    . traMADol (ULTRAM) 50 MG tablet Take 1 tablet (50 mg total) by mouth every 4 (four) hours as needed. To fill on or after 01/15/2020. 120 tablet 0   No current facility-administered medications for this visit.    PHYSICAL EXAMINATION: ECOG PERFORMANCE STATUS: 0 - Asymptomatic  Vitals:   04/12/20 1035  BP: 112/65  Pulse: 78  Resp: 18  Temp: 98.3 F (36.8 C)  SpO2: 97%   Filed Weights   04/12/20 1035  Weight: 222 lb 11.2 oz (101 kg)    GENERAL:alert, no distress and comfortable SKIN: skin color, texture, turgor are normal, no rashes or significant lesions EYES: normal, Conjunctiva are pink and non-injected, sclera clear  NECK: supple, non-tender (+) Mild palpable nodule on thyroid, benign LYMPH:  no palpable lymphadenopathy in the cervical, axillary  LUNGS: clear to auscultation and percussion with normal breathing effort HEART: regular rate & rhythm and no murmurs and no lower extremity edema ABDOMEN:abdomen soft, non-tender and normal bowel sounds (+) Mild abdominal hernia  Musculoskeletal:no cyanosis of digits and no clubbing  NEURO: alert & oriented x 3 with fluent speech, no focal motor/sensory deficits BREAST: S/p left lumpectomy: Surgical incision healed well (+) Palpable nodule around incision, likely scar tissue (+) Left breast mildly smaller than right with firm breast tissue.   LABORATORY DATA:  I have reviewed the data as listed CBC Latest Ref Rng & Units 04/12/2020 12/15/2019 03/17/2019  WBC 4.0 - 10.5  K/uL 7.6 7.6 7.0  Hemoglobin 12.0 - 15.0 g/dL 12.1 13.0 12.2  Hematocrit 36.0 - 46.0 % 39.0 41.3 38.6  Platelets 150 - 400 K/uL 253 277 241     CMP Latest Ref Rng & Units 04/12/2020 12/15/2019 10/18/2019  Glucose 70 - 99 mg/dL 123(H) 139(H) 104(H)  BUN 8 - 23 mg/dL $Remove'18 22 24  'BbuYoHR$ Creatinine 0.44 - 1.00 mg/dL 0.90 1.00 0.83  Sodium 135 - 145 mmol/L 142 141 141  Potassium 3.5 - 5.1 mmol/L 4.0 3.5 4.6  Chloride 98 - 111 mmol/L 103 102 100  CO2 22 - 32 mmol/L $RemoveB'28 28 26  'DFfIzRsB$ Calcium 8.9 - 10.3 mg/dL 9.2 9.3 9.7  Total Protein 6.5 -  8.1 g/dL 7.2 7.3 -  Total Bilirubin 0.3 - 1.2 mg/dL 0.4 0.5 -  Alkaline Phos 38 - 126 U/L 48 48 -  AST 15 - 41 U/L 26 23 -  ALT 0 - 44 U/L 31 26 -      RADIOGRAPHIC STUDIES: I have personally reviewed the radiological images as listed and agreed with the findings in the report. No results found.   ASSESSMENT & PLAN:  Rachel Herman is a 70 y.o. 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 Grade 2, Ki-67 73%. -She was diagnosed in 12/2013.She is status post complete surgical resection with lumpectomy, 12/29/13 -I previously discussed the Oncotype DX result with her and her husband. The recurrence score is 23, with the predicted 10 year recurrence risk of 15% with tamoxifen alone. The benefit of chemotherapy is uncertain in this intermediate risk group.Adjuvant chemo was not recommended -She started adjuvant antiestrogen therapy withExemestanein 06/2014, but stopped in 12/2016 due to high copay. She started Anastrozolein 05/2017 and toleratedmoderatelywell. She has recently completed 5 years therapy  -She is clinically doing well. Lab reviewed, her CBC and CMP are within normal limits. Her physical exam and her 03/2020 mammogram were unremarkable. There is no clinical concern for recurrence. -Continue surveillance.Screeningmammogram in 04/2021. I reviewed  what concerning symptoms to watch for.  -F/u in 1 year with NP Lacie.   2. Left breast pain  -Since the end of August 2021 she started having sharp breast pain left radiating -Has improved and intermittent every 2-3 days. Manageable on Gabapentin $RemoveBefor'600mg'DDxqsLgoEBRd$  in the AM, $Remov'900mg'gbUjTV$  in the PM and Cymbalta $RemoveBefo'20mg'fyfFVvzutTN$ . Will continue.   3.Bone health -Her bone density scan was normal in 05/2014,Normal bone density, T score -0.3 on 1/28/19and normal with -0.5 lowest T-score on 04/01/19.  -She will continue calcium and vitamin D.  4. Hypertension, diabetes, dyslipidemia, chronic low back pain, arthritis, Morbid Obesity -She will continue follow-up with her primary care physicianand ortho Dr Marlou Sa -For Right leg pain since 2020 she can f/u with Dr Marlou Sa for injection as needed. Pain stable.  -For her back pain, she is on Gabapentin and Tramadol 2-3 times a day.  5.4-34mm Right lung nodule, likely benign (initially on June 2016 CT scan), Stable.   6. Thyroid Benign follicular nodule -Her CT scan revealed a 1.7 cm right thyroid nodule. -Ultrasound guided thyroid nodule biopsy on 05/09/2015 showed benign follicular nodule  -Stable on exam    PLAN: -she has completed adjuvant AI -Lab and F/u with NP Lacie in 1 year  -Mammogram in 03/2021   No problem-specific Assessment & Plan notes found for this encounter.   Orders Placed This Encounter  Procedures  . MM Digital Screening    Standing Status:   Future    Standing Expiration Date:   04/12/2021    Order Specific Question:   Reason for Exam (SYMPTOM  OR DIAGNOSIS REQUIRED)    Answer:   screening    Order Specific Question:   Preferred imaging location?    Answer:   [facility]   All questions were answered. The patient knows to call the clinic with any problems, questions or concerns. No barriers to learning was detected. The total time spent in the appointment was 25 minutes.     Truitt Merle, MD 04/12/2020   I, Joslyn Devon, am acting as  scribe for Truitt Merle, MD.   I have reviewed the  above documentation for accuracy and completeness, and I agree with the above.

## 2020-04-12 ENCOUNTER — Inpatient Hospital Stay: Payer: Medicare Other | Admitting: Hematology

## 2020-04-12 ENCOUNTER — Encounter: Payer: Self-pay | Admitting: Hematology

## 2020-04-12 ENCOUNTER — Other Ambulatory Visit: Payer: Self-pay

## 2020-04-12 ENCOUNTER — Inpatient Hospital Stay: Payer: Medicare Other | Attending: Hematology

## 2020-04-12 VITALS — BP 112/65 | HR 78 | Temp 98.3°F | Resp 18 | Wt 222.7 lb

## 2020-04-12 DIAGNOSIS — N644 Mastodynia: Secondary | ICD-10-CM | POA: Diagnosis not present

## 2020-04-12 DIAGNOSIS — Z79899 Other long term (current) drug therapy: Secondary | ICD-10-CM | POA: Diagnosis not present

## 2020-04-12 DIAGNOSIS — G8929 Other chronic pain: Secondary | ICD-10-CM | POA: Insufficient documentation

## 2020-04-12 DIAGNOSIS — Z170 Estrogen receptor positive status [ER+]: Secondary | ICD-10-CM

## 2020-04-12 DIAGNOSIS — E119 Type 2 diabetes mellitus without complications: Secondary | ICD-10-CM | POA: Diagnosis not present

## 2020-04-12 DIAGNOSIS — C50212 Malignant neoplasm of upper-inner quadrant of left female breast: Secondary | ICD-10-CM | POA: Insufficient documentation

## 2020-04-12 DIAGNOSIS — R232 Flushing: Secondary | ICD-10-CM | POA: Diagnosis not present

## 2020-04-12 DIAGNOSIS — Z90721 Acquired absence of ovaries, unilateral: Secondary | ICD-10-CM | POA: Insufficient documentation

## 2020-04-12 DIAGNOSIS — M79604 Pain in right leg: Secondary | ICD-10-CM | POA: Diagnosis not present

## 2020-04-12 DIAGNOSIS — Z923 Personal history of irradiation: Secondary | ICD-10-CM | POA: Diagnosis not present

## 2020-04-12 DIAGNOSIS — E785 Hyperlipidemia, unspecified: Secondary | ICD-10-CM | POA: Diagnosis not present

## 2020-04-12 DIAGNOSIS — E041 Nontoxic single thyroid nodule: Secondary | ICD-10-CM | POA: Diagnosis not present

## 2020-04-12 DIAGNOSIS — Z1231 Encounter for screening mammogram for malignant neoplasm of breast: Secondary | ICD-10-CM

## 2020-04-12 DIAGNOSIS — R911 Solitary pulmonary nodule: Secondary | ICD-10-CM | POA: Insufficient documentation

## 2020-04-12 DIAGNOSIS — I10 Essential (primary) hypertension: Secondary | ICD-10-CM | POA: Insufficient documentation

## 2020-04-12 LAB — COMPREHENSIVE METABOLIC PANEL
ALT: 31 U/L (ref 0–44)
AST: 26 U/L (ref 15–41)
Albumin: 3.6 g/dL (ref 3.5–5.0)
Alkaline Phosphatase: 48 U/L (ref 38–126)
Anion gap: 11 (ref 5–15)
BUN: 18 mg/dL (ref 8–23)
CO2: 28 mmol/L (ref 22–32)
Calcium: 9.2 mg/dL (ref 8.9–10.3)
Chloride: 103 mmol/L (ref 98–111)
Creatinine, Ser: 0.9 mg/dL (ref 0.44–1.00)
GFR, Estimated: >60 mL/min (ref >60)
Glucose, Bld: 123 mg/dL — ABNORMAL HIGH (ref 70–99)
Potassium: 4 mmol/L (ref 3.5–5.1)
Sodium: 142 mmol/L (ref 135–145)
Total Bilirubin: 0.4 mg/dL (ref 0.3–1.2)
Total Protein: 7.2 g/dL (ref 6.5–8.1)

## 2020-04-12 LAB — CBC WITH DIFFERENTIAL/PLATELET
Abs Immature Granulocytes: 0.02 10*3/uL (ref 0.00–0.07)
Basophils Absolute: 0 10*3/uL (ref 0.0–0.1)
Basophils Relative: 1 %
Eosinophils Absolute: 0.3 10*3/uL (ref 0.0–0.5)
Eosinophils Relative: 4 %
HCT: 39 % (ref 36.0–46.0)
Hemoglobin: 12.1 g/dL (ref 12.0–15.0)
Immature Granulocytes: 0 %
Lymphocytes Relative: 29 %
Lymphs Abs: 2.2 10*3/uL (ref 0.7–4.0)
MCH: 27.4 pg (ref 26.0–34.0)
MCHC: 31 g/dL (ref 30.0–36.0)
MCV: 88.4 fL (ref 80.0–100.0)
Monocytes Absolute: 0.5 10*3/uL (ref 0.1–1.0)
Monocytes Relative: 7 %
Neutro Abs: 4.5 10*3/uL (ref 1.7–7.7)
Neutrophils Relative %: 59 %
Platelets: 253 10*3/uL (ref 150–400)
RBC: 4.41 MIL/uL (ref 3.87–5.11)
RDW: 14.2 % (ref 11.5–15.5)
WBC: 7.6 10*3/uL (ref 4.0–10.5)
nRBC: 0 % (ref 0.0–0.2)

## 2020-04-18 ENCOUNTER — Other Ambulatory Visit: Payer: Self-pay | Admitting: Internal Medicine

## 2020-04-18 DIAGNOSIS — G8929 Other chronic pain: Secondary | ICD-10-CM

## 2020-04-18 NOTE — Telephone Encounter (Signed)
Medication Refill - Medication:   traMADol (ULTRAM) 50 MG tablet    Has the patient contacted their pharmacy? Yes.  contact pcp.  (Agent: If no, request that the patient contact the pharmacy for the refill.) (Agent: If yes, when and what did the pharmacy advise?)  Preferred Pharmacy (with phone number or street name):   [id number] [place], [place] - [address]  [address] [place] [address]  Phone: [phone] Fax: [phone]     Agent: Please be advised that RX refills may take up to 3 business days. We ask that you follow-up with your pharmacy.

## 2020-04-18 NOTE — Telephone Encounter (Signed)
Requested medication (s) are due for refill today: yes  Requested medication (s) are on the active medication list:  yes  Last refill:  03/22/2020  Future visit scheduled:  yes  Notes to clinic:  this refill cannot be delegated    Requested Prescriptions  Pending Prescriptions Disp Refills   traMADol (ULTRAM) 50 MG tablet 120 tablet 0    Sig: Take 1 tablet (50 mg total) by mouth every 4 (four) hours as needed. To fill on or after 01/15/2020.      Not Delegated - Analgesics:  Opioid Agonists Failed - 04/18/2020 11:29 AM      Failed - This refill cannot be delegated      Passed - Urine Drug Screen completed in last 360 days      Passed - Valid encounter within last 6 months    Recent Outpatient Visits           2 months ago Type 2 diabetes mellitus with obesity ([place])   [place] Ladell Pier, MD   6 months ago Need for influenza vaccination   [place], Annie Main L, RPH-CPP   6 months ago Type 2 diabetes mellitus with obesity [facility])   [place] Karle Plumber B, MD   8 months ago Type 2 diabetes mellitus with obesity [facility])   [place], MD   10 months ago Type 2 diabetes mellitus with obesity [facility])   [place], MD       Future Appointments             In 1 month Wynetta Emery, Dalbert Batman, MD [place]

## 2020-04-21 ENCOUNTER — Other Ambulatory Visit: Payer: Self-pay | Admitting: Internal Medicine

## 2020-04-21 DIAGNOSIS — E119 Type 2 diabetes mellitus without complications: Secondary | ICD-10-CM

## 2020-04-21 DIAGNOSIS — G8929 Other chronic pain: Secondary | ICD-10-CM

## 2020-04-21 NOTE — Telephone Encounter (Signed)
Requested medication (s) are due for refill today:   Provider to review  Requested medication (s) are on the active medication list:   Yes  Future visit scheduled:   Yes   Last ordered: 03/22/2020 #120, 0 refills  Returned because this is a non delegated refill   Requested Prescriptions  Pending Prescriptions Disp Refills   traMADol (ULTRAM) 50 MG tablet [Pharmacy Med Name: TRAMADOL HCL 50 MG TABLET] 120 tablet 0    Sig: TAKE 1 TABLET (50 MG TOTAL) BY MOUTH EVERY 4 (FOUR) HOURS AS NEEDED.      Not Delegated - Analgesics:  Opioid Agonists Failed - 04/21/2020 12:57 PM      Failed - This refill cannot be delegated      Passed - Urine Drug Screen completed in last 360 days      Passed - Valid encounter within last 6 months    Recent Outpatient Visits           2 months ago Type 2 diabetes mellitus with obesity ([place])   [place] Ladell Pier, MD   6 months ago Need for influenza vaccination   [place], Annie Main L, RPH-CPP   6 months ago Type 2 diabetes mellitus with obesity [facility])   [place] Karle Plumber B, MD   8 months ago Type 2 diabetes mellitus with obesity [facility])   [place], MD   10 months ago Type 2 diabetes mellitus with obesity [facility])   [place], MD       Future Appointments             In 1 month Wynetta Emery, Dalbert Batman, MD [place]

## 2020-04-25 MED ORDER — TRAMADOL HCL 50 MG PO TABS: 50.0000 mg | ORAL_TABLET | ORAL | 0 refills | Status: DC | PRN

## 2020-04-25 NOTE — Telephone Encounter (Signed)
Pt wanted a call back regarding the status of this prescription. Please call back.

## 2020-05-23 ENCOUNTER — Other Ambulatory Visit: Payer: Self-pay | Admitting: Internal Medicine

## 2020-05-23 DIAGNOSIS — M5416 Radiculopathy, lumbar region: Secondary | ICD-10-CM

## 2020-05-23 DIAGNOSIS — G8929 Other chronic pain: Secondary | ICD-10-CM

## 2020-05-23 NOTE — Telephone Encounter (Signed)
Requested medication (s) are due for refill today - yes  Requested medication (s) are on the active medication list -yes  Future visit scheduled -yes  Last refill: 04/25/20 #120  Notes to clinic:Request non delegated Rx  Requested Prescriptions  Pending Prescriptions Disp Refills   traMADol (ULTRAM) 50 MG tablet 120 tablet 0    Sig: Take 1 tablet (50 mg total) by mouth every 4 (four) hours as needed. To fill on or after 01/15/2020.      Not Delegated - Analgesics:  Opioid Agonists Failed - 05/23/2020 12:40 PM      Failed - This refill cannot be delegated      Passed - Urine Drug Screen completed in last 360 days      Passed - Valid encounter within last 6 months    Recent Outpatient Visits           3 months ago Type 2 diabetes mellitus with obesity ([place])   [place] Ladell Pier, MD   7 months ago Need for influenza vaccination   [place], Annie Main L, RPH-CPP   7 months ago Type 2 diabetes mellitus with obesity [facility])   [place] Karle Plumber B, MD   9 months ago Type 2 diabetes mellitus with obesity [facility])   [place] Karle Plumber B, MD   11 months ago Type 2 diabetes mellitus with obesity [facility])   [place] Ladell Pier, MD       Future Appointments             In 3 weeks Ladell Pier, MD [place]                 Requested Prescriptions  Pending Prescriptions Disp Refills   traMADol (ULTRAM) 50 MG tablet 120 tablet 0    Sig: Take 1 tablet (50 mg total) by mouth every 4 (four) hours as needed. To fill on or after 01/15/2020.      Not Delegated - Analgesics:  Opioid Agonists Failed - 05/23/2020 12:40 PM      Failed - This refill cannot be delegated      Passed - Urine Drug Screen completed in last 360 days      Passed - Valid  encounter within last 6 months    Recent Outpatient Visits           3 months ago Type 2 diabetes mellitus with obesity ([place])   [place] Ladell Pier, MD   7 months ago Need for influenza vaccination   [place], Stephen L, RPH-CPP   7 months ago Type 2 diabetes mellitus with obesity [facility])   [place] Karle Plumber B, MD   9 months ago Type 2 diabetes mellitus with obesity [facility])   [place], MD   11 months ago Type 2 diabetes mellitus with obesity [facility])   [place], MD       Future Appointments             In 3 weeks Wynetta Emery Dalbert Batman, MD [place]

## 2020-05-23 NOTE — Telephone Encounter (Signed)
Pt called in for assistance with a refill on her traMADol (ULTRAM) 50 MG tablet. Pt says that she was told to request refill from provider.     Pharmacy:  [id number] - [place], [place] - [address] [place] Phone:  [phone]  Fax:  [phone]       Please assist further

## 2020-05-25 MED ORDER — TRAMADOL HCL 50 MG PO TABS: 50.0000 mg | ORAL_TABLET | ORAL | 0 refills | Status: DC | PRN

## 2020-06-08 ENCOUNTER — Other Ambulatory Visit: Payer: Self-pay | Admitting: Internal Medicine

## 2020-06-08 DIAGNOSIS — I10 Essential (primary) hypertension: Secondary | ICD-10-CM

## 2020-06-13 ENCOUNTER — Telehealth: Payer: Self-pay | Admitting: Physical Medicine and Rehabilitation

## 2020-06-13 NOTE — Telephone Encounter (Signed)
Patient called. She would like an appointment with Dr. Newton.  

## 2020-06-13 NOTE — Telephone Encounter (Signed)
yes

## 2020-06-13 NOTE — Telephone Encounter (Signed)
Scheduled for 5/16 at 1345.

## 2020-06-13 NOTE — Telephone Encounter (Signed)
History of right hip injections. Last was 01/19/2020. Ok to repeat if helped, same problem/side, and no new injury?

## 2020-06-16 ENCOUNTER — Ambulatory Visit: Payer: Medicare Other | Attending: Internal Medicine | Admitting: Internal Medicine

## 2020-06-16 ENCOUNTER — Encounter: Payer: Self-pay | Admitting: Internal Medicine

## 2020-06-16 ENCOUNTER — Other Ambulatory Visit: Payer: Self-pay

## 2020-06-16 VITALS — BP 105/70 | HR 72 | Resp 16 | Wt 219.4 lb

## 2020-06-16 DIAGNOSIS — I152 Hypertension secondary to endocrine disorders: Secondary | ICD-10-CM

## 2020-06-16 DIAGNOSIS — M5431 Sciatica, right side: Secondary | ICD-10-CM

## 2020-06-16 DIAGNOSIS — E1169 Type 2 diabetes mellitus with other specified complication: Secondary | ICD-10-CM

## 2020-06-16 DIAGNOSIS — E1159 Type 2 diabetes mellitus with other circulatory complications: Secondary | ICD-10-CM

## 2020-06-16 DIAGNOSIS — K219 Gastro-esophageal reflux disease without esophagitis: Secondary | ICD-10-CM

## 2020-06-16 DIAGNOSIS — E669 Obesity, unspecified: Secondary | ICD-10-CM | POA: Diagnosis not present

## 2020-06-16 LAB — POCT GLYCOSYLATED HEMOGLOBIN (HGB A1C): HbA1c, POC (controlled diabetic range): 6 % (ref 0.0–7.0)

## 2020-06-16 LAB — GLUCOSE, POCT (MANUAL RESULT ENTRY): POC Glucose: 91 mg/dl (ref 70–99)

## 2020-06-16 MED ORDER — HYDROCODONE-ACETAMINOPHEN 5-325 MG PO TABS: 1.0000 | ORAL_TABLET | Freq: Three times a day (TID) | ORAL | 0 refills | Status: DC | PRN

## 2020-06-16 MED ORDER — OMEPRAZOLE 20 MG PO CPDR: 20.0000 mg | DELAYED_RELEASE_CAPSULE | Freq: Two times a day (BID) | ORAL | 5 refills | Status: DC

## 2020-06-16 NOTE — Progress Notes (Signed)
Patient ID: Rachel Herman, female    DOB: November 12, 1950  MRN: [id number]  CC: Diabetes and Hypertension   Subjective: Rachel Herman is a 70 y.o. female who presents for chronic ds management. Her concerns today include:  Pt with hx of HTN, DM, HL, DJD lumbar and spinal stenosiswith hx of laminectomy 2016, obesity, depression,DuctalCA LT s/plumpectomy/XRT (2015 and 2016, followed by Dr. Annamaria Boots), vaginal prolapse, CKD 3.  Main concern today is pain in the right lower back radiating to the right leg.  She had a flareup of acute on chronic back pain x2 weeks.  No initiating factors.  No loss of bowel or bladder function.  Reports she has had some near falls.  She contacted Dr. Ernestina Patches and she is scheduled to have injections to the back this coming Monday.  Pain is so severe that she had to double up on the tramadol at times and ran out early.  Since then she has been taking Tylenol and Aleve several times a day.   GERD:  Some inc GERD symptoms times 2-3 wks.  Started before taking Aleve.   Avoids foods that cause flare of acid reflux.  She is on omeprazole 20 mg once a day with some days she had to take it twice a day.Marland Kitchen    HTN: Checks blood pressure regularly.  Reports blood pressure has been good at home.  She is compliant with her medications carvedilol and hydrochlorothiazide and low-salt diet.  No chest pains or shortness of breath.    DM:  BS 90-100.  Checks once a day Compliant with metformin. Doing well with eating habits. Results for orders placed or performed in visit on 06/16/20  POCT glucose (manual entry)  Result Value Ref Range   POC Glucose 91 70 - 99 mg/dl  POCT glycosylated hemoglobin (Hb A1C)  Result Value Ref Range   Hemoglobin A1C     HbA1c POC (<> result, manual entry)     HbA1c, POC (prediabetic range)     HbA1c, POC (controlled diabetic range) 6.0 0.0 - 7.0 %    HM:  Had AWV 09/14/19  Patient Active Problem List   Diagnosis Date Noted  . Pneumococcal  vaccination declined 10/18/2019  . Chronic cough 08/03/2018  . Other intervertebral disc degeneration, lumbar region 03/18/2018  . 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  . 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 Lumbar pain with radiation down left and right leg 07/02/2016  . Thyroid nodule 06/09/2015  . Nodule of right lung 07/28/2014  . Diverticulosis of colon without hemorrhage 07/28/2014  . DJD (degenerative joint disease), lumbar 02/22/2014  . Lumbar pain with radiation down left leg 01/17/2014  . Breast cancer of upper-inner quadrant of left female breast ([place]) 01/05/2014  . Hyperlipidemia associated with type 2 diabetes mellitus ([place]) 09/28/2013  . S/P total hysterectomy and bilateral salpingo-oophorectomy 09/20/2013  . Essential hypertension   . Diabetes mellitus ([place]) 09/16/2013     Current Outpatient Medications on File Prior to Visit  Medication Sig Dispense Refill  . acetaminophen (TYLENOL) 500 MG tablet Take 1,000 mg by mouth every 6 (six) hours as needed for headache (pain).    Marland Kitchen albuterol (VENTOLIN HFA) 108 (90 Base) MCG/ACT inhaler Inhale 2 puffs into the lungs every 6 (six) hours as needed for wheezing or shortness of breath. 8 g 3  . anastrozole (ARIMIDEX) 1 MG tablet TAKE 1 TABLET BY MOUTH EVERY DAY (Patient not taking: Reported on 06/16/2020) 90 tablet 3  .  Ascorbic Acid (VITAMIN C) 1000 MG tablet Take 1,000 mg by mouth daily.    Marland Kitchen aspirin EC 325 MG tablet Take 325 mg by mouth daily.    . Blood Glucose Monitoring Suppl (ONE TOUCH ULTRA MINI) w/Device KIT USE AS DIRECTED ONCE DAILY DX CODE E11.9 1 each 0  . carvedilol (COREG) 6.25 MG tablet TAKE 1 TABLET BY MOUTH 2 TIMES DAILY WITH A MEAL. 60 tablet 5  . cloNIDine (CATAPRES) 0.1 MG tablet TAKE 1 TABLET (0.1 MG TOTAL) BY MOUTH 2 (TWO) TIMES DAILY. 180 tablet 1  . [facility] SENNA PLUS 8.6-50 MG tablet TAKE 2 TABLETS BY MOUTH AT BEDTIME AS NEEDED FOR MILD CONSTIPATION (Patient taking differently: Take 1  tablet by mouth at bedtime.) 60 tablet 11  . DULoxetine (CYMBALTA) 20 MG capsule TAKE 1 CAPSULE BY MOUTH EVERY DAY 90 capsule 1  . gabapentin (NEURONTIN) 300 MG capsule TAKE 2 CAPSULES (600 MG) BY MOUTH 2 TIMES DAILY. 360 capsule 3  . hydrochlorothiazide (HYDRODIURIL) 25 MG tablet TAKE 1 TABLET BY MOUTH EVERYDAY AT BEDTIME 90 tablet 1  . metFORMIN (GLUCOPHAGE) 500 MG tablet TAKE 1 & 1/2 TABLETS (750 MG) BY MOUTH 2 TIMES DAILY. 270 tablet 0  . Multiple Vitamins-Minerals (MULTIVITAMIN WITH MINERALS) tablet Take 1 tablet by mouth daily. Reported on 03/13/2015    . omeprazole (PRILOSEC) 20 MG capsule TAKE 1 CAPSULE BY MOUTH EVERY DAY 90 capsule 1  . ONETOUCH DELICA LANCETS 99B MISC USE AS DIRECTED ONCE DAILY DX CODE E11.9 100 each 12  . ONETOUCH ULTRA test strip TEST ONCE DAILY AS DIRECTED E11.9 100 strip 12  . potassium chloride (KLOR-CON) 10 MEQ tablet TAKE 1 TABLET BY MOUTH EVERY DAY 90 tablet 3  . pravastatin (PRAVACHOL) 20 MG tablet TAKE 1 TABLET BY MOUTH EVERY DAY 90 tablet 1  . tizanidine (ZANAFLEX) 2 MG capsule Take 2 mg by mouth every 6 (six) hours as needed for muscle spasms. Take 2 tablets every 6 hours prn    . traMADol (ULTRAM) 50 MG tablet Take 1 tablet (50 mg total) by mouth every 4 (four) hours as needed. To fill on or after 01/15/2020. 120 tablet 0   No current facility-administered medications on file prior to visit.    No Known Allergies  Social History   Socioeconomic History  . Marital status: Married    Spouse name: Zenita Kister   . Number of children: 3  . Years of education: 64  . Highest education level: Not on file  Occupational History    Employer: UNEMPLOYED  Tobacco Use  . Smoking status: Never Smoker  . Smokeless tobacco: Never Used  Vaping Use  . Vaping Use: Never used  Substance and Sexual Activity  . Alcohol use: No  . Drug use: No  . Sexual activity: Not Currently    Birth control/protection: Post-menopausal  Other Topics Concern  . Not on file   Social History Narrative   Married to [employer] in 1986.    Has 3 children from previous marriage, 1 in [place], 1 in [place], 1 in prison ([place]).    Lives with husband.    Right-handed.   1 cup caffeine per day.   Social Determinants of Health   Financial Resource Strain: Not on file  Food Insecurity: Not on file  Transportation Needs: Not on file  Physical Activity: Not on file  Stress: Not on file  Social Connections: Not on file  Intimate Partner Violence: Not on file    Family History  Problem Relation Age of Onset  . Diabetes Mother   . Multiple myeloma Mother 39  . Hearing loss Mother   . Diabetes Brother   . Cancer Brother 40       prostate cancer   . Diabetes Maternal Aunt   . Leukemia Maternal Aunt        dx in her 56s  . Alcoholism Brother   . Heart attack Father   . Diabetes Sister   . Diabetes Son     Past Surgical History:  Procedure Laterality Date  . ABDOMINAL HYSTERECTOMY N/A 09/20/2013   Procedure: HYSTERECTOMY ABDOMINAL;  Surgeon: Osborne Oman, MD;  Location: [facility];  Service: Gynecology;  Laterality: N/A;  . BREAST LUMPECTOMY Left 2015   radiation  . BREAST LUMPECTOMY WITH AXILLARY LYMPH NODE BIOPSY Left 12/29/13   left breast   . CATARACT EXTRACTION Right   . LAPAROSCOPIC ASSISTED VAGINAL HYSTERECTOMY  09/20/2013   Procedure: LAPAROSCOPIC ASSISTED VAGINAL HYSTERECTOMY;  Surgeon: Osborne Oman, MD;  Location: [facility];  Service: Gynecology;;  PT WAS EXAMINED WHILE UP IN [place] AND IT WAS DECIDED TO OPEN PT DUE TO LARGE MASS  . LUMBAR LAMINECTOMY/DECOMPRESSION MICRODISCECTOMY Right 12/14/2014   Procedure: Right Lumbar three-four microdiskectomy;  Surgeon: Newman Pies, MD;  Location: [facility];  Service: Neurosurgery;  Laterality: Right;  Right Lumbar three-four microdiskectomy  . RADIOACTIVE SEED GUIDED PARTIAL MASTECTOMY WITH AXILLARY SENTINEL LYMPH NODE BIOPSY Left 12/29/2013   Procedure: LEFT BREAST RADIOACTIVE SEED  LOCALIZED LUMPECTOMY WITH SENTINEL LYMPH NODE MAPPING;  Surgeon: Erroll Luna, MD;  Location: [facility];  Service: General;  Laterality: Left;  . RE-EXCISION OF BREAST LUMPECTOMY Left 03/02/2014   Procedure: RE-EXCISION OF LEFT BREAST LUMPECTOMY;  Surgeon: Erroll Luna, MD;  Location: [facility];  Service: General;  Laterality: Left;  . SALPINGOOPHORECTOMY  09/20/2013   Procedure: SALPINGO OOPHORECTOMY;  Surgeon: Osborne Oman, MD;  Location: [facility];  Service: Gynecology;;    ROS: Review of Systems Negative except as stated above  PHYSICAL EXAM: BP 105/70   Pulse 72   Resp 16   Wt 219 lb 6.4 oz (99.5 kg)   SpO2 95%   BMI 36.51 kg/m   Physical Exam  General appearance - alert, well appearing, elderly female and in no distress.  She has a cane with her today.  She is sitting in the chair and request to be examined in the chair because she feels she would have difficulty getting on the exam table due to back pain. Mental status - normal mood, behavior, speech, dress, motor activity, and thought processes Chest - clear to auscultation, no wheezes, rales or rhonchi, symmetric air entry Heart - normal rate, regular rhythm, normal S1, S2, no murmurs, rubs, clicks or gallops Neurological -power in the left lower extremity 5/5 proximally and distally.  Power in the right lower extremity 3-4/5.  She gives to pain.   Extremities -positive varicosities in the lower extremities and feet.  No lower extremity edema.  CMP Latest Ref Rng & Units 04/12/2020 12/15/2019 10/18/2019  Glucose 70 - 99 mg/dL 123(H) 139(H) 104(H)  BUN 8 - 23 mg/dL '18 22 24  ' Creatinine 0.44 - 1.00 mg/dL 0.90 1.00 0.83  Sodium 135 - 145 mmol/L 142 141 141  Potassium 3.5 - 5.1 mmol/L 4.0 3.5 4.6  Chloride 98 - 111 mmol/L 103 102 100  CO2 22 - 32 mmol/L '28 28 26  ' Calcium 8.9 - 10.3 mg/dL 9.2 9.3 9.7  Total Protein 6.5 - 8.1 g/dL 7.2 7.3 -  Total Bilirubin 0.3 - 1.2 mg/dL 0.4 0.5 -  Alkaline  Phos 38 - 126 U/L 48 48 -  AST 15 - 41 U/L 26 23 -  ALT 0 - 44 U/L 31 26 -   Lipid Panel     Component Value Date/Time   CHOL 191 10/18/2019 1200   TRIG 215 (H) 10/18/2019 1200   HDL 44 10/18/2019 1200   CHOLHDL 4.3 10/18/2019 1200   CHOLHDL 5.9 09/27/2013 1053   VLDL 48 (H) 09/27/2013 1053   LDLCALC 110 (H) 10/18/2019 1200    CBC    Component Value Date/Time   WBC 7.6 04/12/2020 1012   RBC 4.41 04/12/2020 1012   HGB 12.1 04/12/2020 1012   HGB 14.5 11/08/2016 0915   HCT 39.0 04/12/2020 1012   HCT 44.2 11/08/2016 0915   PLT 253 04/12/2020 1012   PLT 295 11/08/2016 0915   MCV 88.4 04/12/2020 1012   MCV 85.3 11/08/2016 0915   MCH 27.4 04/12/2020 1012   MCHC 31.0 04/12/2020 1012   RDW 14.2 04/12/2020 1012   RDW 15.1 (H) 11/08/2016 0915   LYMPHSABS 2.2 04/12/2020 1012   LYMPHSABS 1.7 11/08/2016 0915   MONOABS 0.5 04/12/2020 1012   MONOABS 0.7 11/08/2016 0915   EOSABS 0.3 04/12/2020 1012   EOSABS 0.1 11/08/2016 0915   BASOSABS 0.0 04/12/2020 1012   BASOSABS 0.0 11/08/2016 0915    ASSESSMENT AND PLAN: 1. Type 2 diabetes mellitus with obesity (HCC) At goal.  Continue metformin and healthy eating habits. - POCT glucose (manual entry) - POCT glycosylated hemoglobin (Hb A1C)  2. Sciatica of right side Patient has flareup of her lower back pain with significant pain.  She is scheduled for epidural injection on Monday.  I have given a limited supply of Norco to use as needed.  She tells me that she has taken it in the past.  I have warned that the medicine can cause drowsiness and to take it only when needed and as prescribed.  I will hold off on refilling tramadol until she has completed the Norco and has had her epidural injection. [place] controlled substance reporting system reviewed. - HYDROcodone-acetaminophen (NORCO) 5-325 MG tablet; Take 1 tablet by mouth every 8 (eight) hours as needed for moderate pain.  Dispense: 15 tablet; Refill: 0  3. Hypertension  associated with diabetes ([place]) At goal.  Continue current medications and low-salt diet  4. Gastroesophageal reflux disease without esophagitis GERD precautions discussed and encouraged.  She may have had flare because of recent frequent use of Aleve.  Advised to stop the Aleve.  Increase omeprazole to twice daily dosing. - omeprazole (PRILOSEC) 20 MG capsule; Take 1 capsule (20 mg total) by mouth 2 (two) times daily before a meal.  Dispense: 60 capsule; Refill: 5    Patient was given the opportunity to ask questions.  Patient verbalized understanding of the plan and was able to repeat key elements of the plan.   Orders Placed This Encounter  Procedures  . POCT glucose (manual entry)  . POCT glycosylated hemoglobin (Hb A1C)     Requested Prescriptions    No prescriptions requested or ordered in this encounter    No follow-ups on file.  Karle Plumber, MD, FACP

## 2020-06-16 NOTE — Patient Instructions (Signed)
I have sent a prescription for a limited supply of hydrocodone to your pharmacy.  Take this as needed for the next several days to help with the pain until you get into see Dr. Ernestina Patches.  This medication has Tylenol in it so you should not take Tylenol while on the hydrocodone.  Stop the Aleve.  We have increased the omeprazole to twice a day dosing.

## 2020-06-16 NOTE — Progress Notes (Signed)
Pt states she is having pain in right leg

## 2020-06-19 ENCOUNTER — Ambulatory Visit: Payer: Self-pay

## 2020-06-19 ENCOUNTER — Other Ambulatory Visit: Payer: Self-pay

## 2020-06-19 ENCOUNTER — Ambulatory Visit: Payer: Medicare Other | Admitting: Physical Medicine and Rehabilitation

## 2020-06-19 ENCOUNTER — Encounter: Payer: Self-pay | Admitting: Physical Medicine and Rehabilitation

## 2020-06-19 DIAGNOSIS — M25551 Pain in right hip: Secondary | ICD-10-CM | POA: Diagnosis not present

## 2020-06-19 MED ORDER — BUPIVACAINE HCL 0.25 % IJ SOLN
4.0000 mL | INTRAMUSCULAR | Status: AC | PRN
  Administered 2020-06-19: 4 mL via INTRA_ARTICULAR

## 2020-06-19 MED ORDER — TRIAMCINOLONE ACETONIDE 40 MG/ML IJ SUSP
60.0000 mg | INTRAMUSCULAR | Status: AC | PRN
  Administered 2020-06-19: 60 mg via INTRA_ARTICULAR

## 2020-06-19 NOTE — Progress Notes (Signed)
Pt state right hip and leg pain. Pt state walking and laying down makes the pain worse. Pt state she take pain meds to help ease her pain.   Numeric Pain Rating Scale and Functional Assessment Average Pain 8   In the last MONTH (on 0-10 scale) has pain interfered with the following?  1. General activity like being  able to carry out your everyday physical activities such as walking, climbing stairs, carrying groceries, or moving a chair?  Rating(9)    -BT, -Dye Allergies.

## 2020-06-19 NOTE — Progress Notes (Signed)
   Rachel Herman - 70 y.o. female MRN [id number]  Date of birth: 12/23/1950  Office Visit Note: Visit Date: 06/19/2020 PCP: Ladell Pier, MD Referred by: Ladell Pier, MD  Subjective: Chief Complaint  Patient presents with  . Right Hip - Pain  . Left Leg - Pain   HPI:  Rachel Herman is a 70 y.o. female who comes in today for planned repeat Right anesthetic hip arthrogram with fluoroscopic guidance.  The patient has failed conservative care including home exercise, medications, time and activity modification. Prior injection gave more than 50% relief for several months. This injection will be diagnostic and hopefully therapeutic.  Please see requesting physician notes for further details and justification.  She tells me today she hopes that this injection "works ".  She said the last injection did help for about 5 months and I did let her know that that was working at least in our definition.  Referring: Dr. Landry Dyke Dean    ROS Otherwise per HPI.  Assessment & Plan: Visit Diagnoses:    ICD-10-CM   1. Pain in right hip  M25.551 Large Joint Inj: R hip joint    XR C-ARM NO REPORT    Plan: No additional findings.   Meds & Orders: No orders of the defined types were placed in this encounter.   Orders Placed This Encounter  Procedures  . Large Joint Inj: R hip joint  . XR C-ARM NO REPORT    Follow-up: Return for  Anderson Malta, MD.   Procedures: Large Joint Inj: R hip joint on 06/19/2020 1:59 PM Indications: diagnostic evaluation and pain Details: 22 G 3.5 in needle, fluoroscopy-guided anterior approach  Arthrogram: No  Medications: 4 mL bupivacaine 0.25 %; 60 mg triamcinolone acetonide 40 MG/ML Outcome: tolerated well, no immediate complications  There was excellent flow of contrast producing a partial arthrogram of the hip. The patient had minimal relief of symptoms during the anesthetic phase of the injection. Procedure, treatment alternatives, risks  and benefits explained, specific risks discussed. Consent was given by the patient. Immediately prior to procedure a time out was called to verify the correct patient, procedure, equipment, support staff and site/side marked as required. Patient was prepped and draped in the usual sterile fashion.          Clinical History: 08/25/2019 Hip xray: AP pelvis lateral bilateral hips reviewed. 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 Mild to moderate  arthritis present in the left hip. No acute fracture. No other bony  pelvic abnormality.     Objective:  VS:  HT:    WT:   BMI:     BP:   HR: bpm  TEMP: ( )  RESP:  Physical Exam Constitutional:      Appearance: She is obese.  Musculoskeletal:     Comments: Patient ambulates with an antalgic gait to the right with the use of a cane.  Not really Trendelenburg gait.  Painful range of motion with internal rotation.      Imaging: XR C-ARM NO REPORT  Result Date: 06/19/2020 Please see Notes tab for imaging impression.

## 2020-06-26 ENCOUNTER — Other Ambulatory Visit: Payer: Self-pay | Admitting: Internal Medicine

## 2020-06-26 DIAGNOSIS — M5416 Radiculopathy, lumbar region: Secondary | ICD-10-CM

## 2020-06-26 DIAGNOSIS — G8929 Other chronic pain: Secondary | ICD-10-CM

## 2020-06-26 NOTE — Telephone Encounter (Signed)
Medication: traMADol (ULTRAM) 50 MG tablet [[id number]]   Has the patient contacted their pharmacy? YES  (Agent: If no, request that the patient contact the pharmacy for the refill.) (Agent: If yes, when and what did the pharmacy advise?)  Preferred Pharmacy (with phone number or street name): [id number] - [place], [place] - [address] [place] [address] Phone: [phone] Fax: [phone] Hours: Not open 24 hours    Agent: Please be advised that RX refills may take up to 3 business days. We ask that you follow-up with your pharmacy.

## 2020-06-26 NOTE — Telephone Encounter (Signed)
Requested medications are due for refill today.  yes  Requested medications are on the active medications list.  yes  Last refill. 05/26/2019  Future visit scheduled.   yes  Notes to clinic.  Medication not delegated.

## 2020-06-28 MED ORDER — TRAMADOL HCL 50 MG PO TABS: 50.0000 mg | ORAL_TABLET | ORAL | 0 refills | Status: DC | PRN

## 2020-07-05 ENCOUNTER — Other Ambulatory Visit: Payer: Self-pay | Admitting: Hematology

## 2020-07-06 ENCOUNTER — Ambulatory Visit: Payer: Self-pay

## 2020-07-06 NOTE — Telephone Encounter (Signed)
Patient called and she says she's been having leg cramps, right greater than left. She says the cramps happen at night, but at times during the day. She says it goes from her hip all the way to her toes. She says the pain now is a 6-7 to her right leg. She says she's been walking which is not new. She denies any other symptoms. She says she wonders if it's her potassium. I advised no available appointments with PCP until August and no other provider is available within the next few weeks. She says she would like Dr. Wynetta Emery to let her know if there is something she can do about the cramps. Care advice given, advised UC, Mobile bus as alternative options. She verbalized understanding.  Reason for Disposition . Leg pain or muscle cramp is a chronic symptom (recurrent or ongoing AND present > 4 weeks)  Answer Assessment - Initial Assessment Questions 1. ONSET: "When did the pain start?"      Friday or Saturday 2. LOCATION: "Where is the pain located?"      Right leg from hip to toes, left not as bad as the right one 3. PAIN: "How bad is the pain?"    (Scale 1-10; or mild, moderate, severe)   -  MILD (1-3): doesn't interfere with normal activities    -  MODERATE (4-7): interferes with normal activities (e.g., work or school) or awakens from sleep, limping    -  SEVERE (8-10): excruciating pain, unable to do any normal activities, unable to walk     6-7 4. WORK OR EXERCISE: "Has there been any recent work or exercise that involved this part of the body?"      Just walking as excersise 5. CAUSE: "What do you think is causing the leg pain?"     No idea, wonders if it's the potassium 6. OTHER SYMPTOMS: "Do you have any other symptoms?" (e.g., chest pain, back pain, breathing difficulty, swelling, rash, fever, numbness, weakness)     No other symptoms just the cramping in legs 7. PREGNANCY: "Is there any chance you are pregnant?" "When was your last menstrual period?"     No  Protocols used: LEG  PAIN-A-AH

## 2020-07-07 NOTE — Telephone Encounter (Signed)
Please contact pt and schedule a 810 or 130 virtual appt for next week for leg cramps

## 2020-07-10 ENCOUNTER — Other Ambulatory Visit: Payer: Self-pay

## 2020-07-10 ENCOUNTER — Ambulatory Visit: Payer: Medicare Other | Attending: Internal Medicine | Admitting: Internal Medicine

## 2020-07-10 DIAGNOSIS — R252 Cramp and spasm: Secondary | ICD-10-CM | POA: Diagnosis not present

## 2020-07-10 NOTE — Telephone Encounter (Signed)
Pt is scheduled a virtual appt with provider for 6/6 at 150pm

## 2020-07-10 NOTE — Progress Notes (Signed)
Virtual Visit via Telephone Note  I connected with Rachel Herman on 07/10/2020 at 2:05 PM by telephone and verified that I am speaking with the correct person using two identifiers  Location: Patient: home Provider: office  Participants: Myself Patient CMA: Rachel Herman interpreter:   I discussed the limitations, risks, security and privacy concerns of performing an evaluation and management service by telephone and the availability of in person appointments. I also discussed with the patient that there may be a patient responsible charge related to this service. The patient expressed understanding and agreed to proceed.   History of Present Illness: Pt with hx of HTN, DM, HL, DJD lumbar and spinal stenosiswith hx of laminectomy 2016, obesity, depression,DuctalCA LT s/plumpectomy/XRT (2015 and 2016, followed by Rachel Herman), vaginal prolapse, CKD 3. Last seen 06/16/2020.  This is an urgent care visit for muscle cramps.  Pt c/o cramps in the legs.  That started yesterday. Patient states she was doing a lot of running up and down at church yesterday.  By the afternoon she started having some cramps in the left leg.  It went away.  She woke up this morning with cramps in the right leg.  She got up and had to walk off the cramps.  Now she just has a feeling of soreness in the legs from the knees up.  Reports having intermittent cramps in the legs for a while but yesterday was the worst it had been.  Cramps go away or get better when she gets up and moves around.  She is on pravastatin 20 mg daily.  She wonders whether her potassium may be low.  She is on potassium supplement.  Outpatient Encounter Medications as of 07/10/2020  Medication Sig  . albuterol (VENTOLIN HFA) 108 (90 Base) MCG/ACT inhaler Inhale 2 puffs into the lungs every 6 (six) hours as needed for wheezing or shortness of breath.  . anastrozole (ARIMIDEX) 1 MG tablet TAKE 1 TABLET BY MOUTH EVERY DAY  . Ascorbic Acid  (VITAMIN C) 1000 MG tablet Take 1,000 mg by mouth daily.  Marland Kitchen aspirin EC 325 MG tablet Take 325 mg by mouth daily.  . Blood Glucose Monitoring Suppl (ONE TOUCH ULTRA MINI) w/Device KIT USE AS DIRECTED ONCE DAILY DX CODE E11.9  . carvedilol (COREG) 6.25 MG tablet TAKE 1 TABLET BY MOUTH 2 TIMES DAILY WITH A MEAL.  . cloNIDine (CATAPRES) 0.1 MG tablet TAKE 1 TABLET (0.1 MG TOTAL) BY MOUTH 2 (TWO) TIMES DAILY.  . [facility] SENNA PLUS 8.6-50 MG tablet TAKE 2 TABLETS BY MOUTH AT BEDTIME AS NEEDED FOR MILD CONSTIPATION (Patient taking differently: Take 1 tablet by mouth at bedtime.)  . DULoxetine (CYMBALTA) 20 MG capsule TAKE 1 CAPSULE BY MOUTH EVERY DAY  . gabapentin (NEURONTIN) 300 MG capsule TAKE 2 CAPSULES (600 MG) BY MOUTH 2 TIMES DAILY.  . hydrochlorothiazide (HYDRODIURIL) 25 MG tablet TAKE 1 TABLET BY MOUTH EVERYDAY AT BEDTIME  . metFORMIN (GLUCOPHAGE) 500 MG tablet TAKE 1 & 1/2 TABLETS (750 MG) BY MOUTH 2 TIMES DAILY.  . Multiple Vitamins-Minerals (MULTIVITAMIN WITH MINERALS) tablet Take 1 tablet by mouth daily. Reported on 03/13/2015  . omeprazole (PRILOSEC) 20 MG capsule Take 1 capsule (20 mg total) by mouth 2 (two) times daily before a meal.  . ONETOUCH DELICA LANCETS 20E MISC USE AS DIRECTED ONCE DAILY DX CODE E11.9  . ONETOUCH ULTRA test strip TEST ONCE DAILY AS DIRECTED E11.9  . potassium chloride (KLOR-CON) 10 MEQ tablet TAKE 1 TABLET BY MOUTH  EVERY DAY  . pravastatin (PRAVACHOL) 20 MG tablet TAKE 1 TABLET BY MOUTH EVERY DAY  . tizanidine (ZANAFLEX) 2 MG capsule Take 2 mg by mouth every 6 (six) hours as needed for muscle spasms. Take 2 tablets every 6 hours prn  . traMADol (ULTRAM) 50 MG tablet Take 1 tablet (50 mg total) by mouth every 4 (four) hours as needed (Each prescription to last 1 month). To fill on or after 01/15/2020.   No facility-administered encounter medications on file as of 07/10/2020.      Observations/Objective: No direct observation done as this was a telephone  encounter.  Assessment and Plan: 1. Muscular cramp Cramps most likely due to statin therapy.  I have advised her to try taking the Pravachol every other day instead of daily to see whether the cramps decreased.  She will also come to the lab to have chemistry done. - Basic Metabolic Panel; Future   Follow Up Instructions: As needed if no improvement.  Otherwise she will keep her regular follow-up visit with me.   I discussed the assessment and treatment plan with the patient. The patient was provided an opportunity to ask questions and all were answered. The patient agreed with the plan and demonstrated an understanding of the instructions.   The patient was advised to call back or seek an in-person evaluation if the symptoms worsen or if the condition fails to improve as anticipated.  I  Spent 10 minutes on this telephone encounter  Karle Plumber, MD

## 2020-07-13 ENCOUNTER — Other Ambulatory Visit: Payer: Self-pay | Admitting: Internal Medicine

## 2020-07-13 DIAGNOSIS — R252 Cramp and spasm: Secondary | ICD-10-CM | POA: Diagnosis not present

## 2020-07-13 DIAGNOSIS — E119 Type 2 diabetes mellitus without complications: Secondary | ICD-10-CM

## 2020-07-13 NOTE — Telephone Encounter (Signed)
Future OV 10/17/20. Approved per protocol.  Requested Prescriptions  Pending Prescriptions Disp Refills  . metFORMIN (GLUCOPHAGE) 500 MG tablet [Pharmacy Med Name: METFORMIN HCL 500 MG TABLET] 270 tablet 0    Sig: TAKE 1 & 1/2 TABLETS (750 MG) BY MOUTH 2 TIMES DAILY.     Endocrinology:  Diabetes - Biguanides Passed - 07/13/2020  2:31 PM      Passed - Cr in normal range and within 360 days    Creatinine  Date Value Ref Range Status  10/18/2019 97.4 20.0 - 300.0 mg/dL Final  11/08/2016 1.2 (H) 0.6 - 1.1 mg/dL Final   Creatinine, Ser  Date Value Ref Range Status  04/12/2020 0.90 0.44 - 1.00 mg/dL Final   Creatinine, POC  Date Value Ref Range Status  07/02/2016 183m mg/dL Final         Passed - HBA1C is between 0 and 7.9 and within 180 days    HbA1c, POC (prediabetic range)  Date Value Ref Range Status  06/08/2019 5.9 5.7 - 6.4 % Final   HbA1c, POC (controlled diabetic range)  Date Value Ref Range Status  06/16/2020 6.0 0.0 - 7.0 % Final         Passed - eGFR in normal range and within 360 days    GFR calc Af Amer  Date Value Ref Range Status  10/18/2019 83 >59 mL/min/1.73 Final    Comment:    **[facility] currently reports eGFR in compliance with the current**   recommendations of the [employer] [facility] will   update reporting as new guidelines are published from the NKF-ASN   Task force.    GFR, Estimated  Date Value Ref Range Status  04/12/2020 >60 >60 mL/min Final    Comment:    (NOTE) Calculated using the CKD-EPI Creatinine Equation (2021)    EGFR  Date Value Ref Range Status  11/08/2016 49 (L) >90 ml/min/1.73 m2 Final    Comment:    eGFR is calculated using the CKD-EPI Creatinine Equation (2009)         Passed - Valid encounter within last 6 months    Recent Outpatient Visits          3 days ago Muscular cramp   [place], [place]   3 weeks ago Type 2 diabetes mellitus with obesity ([facility]    [place] Plumber[place], MD   4 months ago Type 2 diabetes mellitus with obesity ([facility]   CT[place] MD   8 months ago Need for influenza vaccination   [place] SAnnie MainL, RPH-CPP   8 months ago Type 2 diabetes mellitus with obesity ([facility]   [place] [facility] J[facility]adell Pier [facility]

## 2020-07-13 NOTE — Addendum Note (Signed)
Addended bySteffanie Dunn on: 07/13/2020 09:14 AM   Modules accepted: Orders

## 2020-07-14 ENCOUNTER — Telehealth: Payer: Self-pay

## 2020-07-14 ENCOUNTER — Ambulatory Visit: Payer: Self-pay | Admitting: *Deleted

## 2020-07-14 LAB — BASIC METABOLIC PANEL
BUN/Creatinine Ratio: 30 — ABNORMAL HIGH (ref 12–28)
BUN: 25 mg/dL (ref 8–27)
CO2: 27 mmol/L (ref 20–29)
Calcium: 9.7 mg/dL (ref 8.7–10.3)
Chloride: 100 mmol/L (ref 96–106)
Creatinine, Ser: 0.84 mg/dL (ref 0.57–1.00)
Glucose: 122 mg/dL — ABNORMAL HIGH (ref 65–99)
Potassium: 4.3 mmol/L (ref 3.5–5.2)
Sodium: 142 mmol/L (ref 134–144)
eGFR: 75 mL/min/{1.73_m2} (ref >59)

## 2020-07-14 NOTE — Telephone Encounter (Signed)
Lab results addressed in result note.

## 2020-07-14 NOTE — Telephone Encounter (Signed)
Contacted pt to go over lab results pt didn't answer lvm  

## 2020-07-28 ENCOUNTER — Other Ambulatory Visit: Payer: Self-pay | Admitting: Internal Medicine

## 2020-07-28 DIAGNOSIS — M5416 Radiculopathy, lumbar region: Secondary | ICD-10-CM

## 2020-07-28 DIAGNOSIS — G8929 Other chronic pain: Secondary | ICD-10-CM

## 2020-07-28 NOTE — Telephone Encounter (Signed)
Copied from [place] [id number]. Topic: Quick Communication - Rx Refill/Question >> Jul 28, 2020 10:20 AM Yvette Rack wrote: Medication: traMADol (ULTRAM) 50 MG tablet  Has the patient contacted their pharmacy? No. (Agent: If no, request that the patient contact the pharmacy for the refill.) (Agent: If yes, when and what did the pharmacy advise?)  Preferred Pharmacy (with phone number or street name): [id number] [place], [place] - [address]  Phone: [phone]   Fax: [phone]  Agent: Please be advised that RX refills may take up to 3 business days. We ask that you follow-up with your pharmacy.

## 2020-07-28 NOTE — Telephone Encounter (Signed)
Requested medication (s) are due for refill today: yes   Requested medication (s) are on the active medication list: yes   Last refill: 06/26/2020  Future visit scheduled: yes   Notes to clinic: this refill cannot be delegated    Requested Prescriptions  Pending Prescriptions Disp Refills   traMADol (ULTRAM) 50 MG tablet 120 tablet 0    Sig: Take 1 tablet (50 mg total) by mouth every 4 (four) hours as needed (Each prescription to last 1 month). To fill on or after 01/15/2020.      Not Delegated - Analgesics:  Opioid Agonists Failed - 07/28/2020 10:24 AM      Failed - This refill cannot be delegated      Passed - Urine Drug Screen completed in last 360 days      Passed - Valid encounter within last 6 months    Recent Outpatient Visits           2 weeks ago Muscular cramp   [place], MD   1 month ago Type 2 diabetes mellitus with obesity [facility])   [place] Karle Plumber B, MD   5 months ago Type 2 diabetes mellitus with obesity [facility])   [place], MD   9 months ago Need for influenza vaccination   [place], Jarome Matin, RPH-CPP   9 months ago Type 2 diabetes mellitus with obesity [facility])   [place] [facility] Ladell Pier, MD

## 2020-07-29 MED ORDER — TRAMADOL HCL 50 MG PO TABS: 50.0000 mg | ORAL_TABLET | ORAL | 0 refills | Status: DC | PRN

## 2020-08-16 ENCOUNTER — Other Ambulatory Visit: Payer: Self-pay | Admitting: Internal Medicine

## 2020-08-16 DIAGNOSIS — I10 Essential (primary) hypertension: Secondary | ICD-10-CM

## 2020-08-16 NOTE — Telephone Encounter (Signed)
Future OV 10/17/20. Approved per protocol.  Requested Prescriptions  Pending Prescriptions Disp Refills  . carvedilol (COREG) 6.25 MG tablet [Pharmacy Med Name: CARVEDILOL 6.25 MG TABLET] 180 tablet 1    Sig: TAKE 1 TABLET BY MOUTH 2 TIMES DAILY WITH A MEAL.     Cardiovascular:  Beta Blockers Passed - 08/16/2020  3:51 PM      Passed - Last BP in normal range    BP Readings from Last 1 Encounters:  06/16/20 105/70         Passed - Last Heart Rate in normal range    Pulse Readings from Last 1 Encounters:  06/16/20 72         Passed - Valid encounter within last 6 months    Recent Outpatient Visits          1 month ago Muscular cramp   [place], Neoma Laming B, MD   2 months ago Type 2 diabetes mellitus with obesity [facility])   [place] Karle Plumber B, MD   6 months ago Type 2 diabetes mellitus with obesity [facility])   [place], MD   10 months ago Need for influenza vaccination   [place], Annie Main L, RPH-CPP   10 months ago Type 2 diabetes mellitus with obesity [facility])   [place] [facility] Ladell Pier, MD

## 2020-08-23 DIAGNOSIS — E119 Type 2 diabetes mellitus without complications: Secondary | ICD-10-CM | POA: Diagnosis not present

## 2020-08-23 DIAGNOSIS — Z961 Presence of intraocular lens: Secondary | ICD-10-CM | POA: Diagnosis not present

## 2020-08-23 DIAGNOSIS — H40013 Open angle with borderline findings, low risk, bilateral: Secondary | ICD-10-CM | POA: Diagnosis not present

## 2020-08-23 LAB — HM DIABETES EYE EXAM

## 2020-08-28 ENCOUNTER — Other Ambulatory Visit: Payer: Self-pay | Admitting: Internal Medicine

## 2020-08-28 DIAGNOSIS — G8929 Other chronic pain: Secondary | ICD-10-CM

## 2020-08-28 NOTE — Telephone Encounter (Signed)
Requested medication (s) are due for refill today: yes  Requested medication (s) are on the active medication list: yes  Last refill:  07/29/20  Future visit scheduled: yes  Notes to clinic:  med not delegated to NT to RF   Requested Prescriptions  Pending Prescriptions Disp Refills   traMADol (ULTRAM) 50 MG tablet 120 tablet 0    Sig: Take 1 tablet (50 mg total) by mouth every 4 (four) hours as needed (Each prescription to last 1 month).      Not Delegated - Analgesics:  Opioid Agonists Failed - 08/28/2020  4:25 PM      Failed - This refill cannot be delegated      Passed - Urine Drug Screen completed in last 360 days      Passed - Valid encounter within last 6 months    Recent Outpatient Visits           1 month ago Muscular cramp   [place] Karle Plumber B, MD   2 months ago Type 2 diabetes mellitus with obesity [facility])   [place] Karle Plumber B, MD   6 months ago Type 2 diabetes mellitus with obesity [facility])   [place], MD   10 months ago Need for influenza vaccination   [place], Annie Main L, RPH-CPP   10 months ago Type 2 diabetes mellitus with obesity [facility])   [place] [facility] Ladell Pier, MD

## 2020-08-28 NOTE — Telephone Encounter (Signed)
Copied from [place] [id number]. Topic: Quick Communication - Rx Refill/Question >> Aug 28, 2020  4:18 PM Yvette Rack wrote: Medication: traMADol (ULTRAM) 50 MG tablet  Has the patient contacted their pharmacy? No. (Agent: If no, request that the patient contact the pharmacy for the refill.) (Agent: If yes, when and what did the pharmacy advise?)  Preferred Pharmacy (with phone number or street name[id number] [address] Phone: [phone]  Fax: [phone] Agent: Please be advised that RX refills may take up to 3 business days. We ask that you follow-up with your pharmacy.

## 2020-08-30 MED ORDER — TRAMADOL HCL 50 MG PO TABS: 50.0000 mg | ORAL_TABLET | ORAL | 0 refills | Status: DC | PRN

## 2020-09-05 ENCOUNTER — Other Ambulatory Visit: Payer: Self-pay | Admitting: Internal Medicine

## 2020-09-05 DIAGNOSIS — I10 Essential (primary) hypertension: Secondary | ICD-10-CM

## 2020-09-08 ENCOUNTER — Other Ambulatory Visit: Payer: Self-pay | Admitting: Internal Medicine

## 2020-09-08 DIAGNOSIS — M5416 Radiculopathy, lumbar region: Secondary | ICD-10-CM

## 2020-09-08 MED ORDER — GABAPENTIN 300 MG PO CAPS: ORAL_CAPSULE | ORAL | 3 refills | Status: DC

## 2020-09-08 NOTE — Telephone Encounter (Signed)
Medication: gabapentin (NEURONTIN) 300 MG capsule  Has the pt contacted their pharmacy? Yes but she has not heard anything. She states she is completely out of this.  [id number] [place], [place] - [address] Please be advised refills may take up to 3 business days.  We ask that you follow up with your pharmacy.

## 2020-09-29 ENCOUNTER — Other Ambulatory Visit: Payer: Self-pay | Admitting: Internal Medicine

## 2020-09-29 ENCOUNTER — Ambulatory Visit: Payer: Self-pay | Admitting: *Deleted

## 2020-09-29 DIAGNOSIS — G8929 Other chronic pain: Secondary | ICD-10-CM

## 2020-09-29 MED ORDER — TRAMADOL HCL 50 MG PO TABS: 50.0000 mg | ORAL_TABLET | ORAL | 0 refills | Status: DC | PRN

## 2020-09-29 NOTE — Telephone Encounter (Signed)
Requested medication (s) are due for refill today: yes  Requested medication (s) are on the active medication list: yes  Last refill:  08/30/20 #120 0 refills  Future visit scheduled: medicare wellness visit scheduled for 10/17/20  Notes to clinic:  not delegated per protocol. Please advise if patient needs another appt to review right leg pain      Requested Prescriptions  Pending Prescriptions Disp Refills   traMADol (ULTRAM) 50 MG tablet 120 tablet 0    Sig: Take 1 tablet (50 mg total) by mouth every 4 (four) hours as needed (Each prescription to last 1 month).     Not Delegated - Analgesics:  Opioid Agonists Failed - 09/29/2020 11:38 AM      Failed - This refill cannot be delegated      Passed - Urine Drug Screen completed in last 360 days      Passed - Valid encounter within last 6 months    Recent Outpatient Visits           2 months ago Muscular cramp   [place] Karle Plumber B, MD   3 months ago Type 2 diabetes mellitus with obesity [facility])   [place] Karle Plumber B, MD   7 months ago Type 2 diabetes mellitus with obesity [facility])   [place], MD   11 months ago Need for influenza vaccination   [place], Stephen L, RPH-CPP   11 months ago Type 2 diabetes mellitus with obesity [facility])   [place] [facility] Ladell Pier, MD

## 2020-09-29 NOTE — Telephone Encounter (Signed)
Reason for Disposition  [1] MODERATE pain (e.g., interferes with normal activities, limping) AND [2] present > 3 days  Answer Assessment - Initial Assessment Questions 1. ONSET: "When did the pain start?"      Couple of weeks ago getting worse 2. LOCATION: "Where is the pain located?"      Right leg from back to inner thigh to knee to outer leg .  3. PAIN: "How bad is the pain?"    (Scale 1-10; or mild, moderate, severe)   -  MILD (1-3): doesn't interfere with normal activities    -  MODERATE (4-7): interferes with normal activities (e.g., work or school) or awakens from sleep, limping    -  SEVERE (8-10): excruciating pain, unable to do any normal activities, unable to walk     severe 4. WORK OR EXERCISE: "Has there been any recent work or exercise that involved this part of the body?"      Na  5. CAUSE: "What do you think is causing the leg pain?"     Not sure  6. OTHER SYMPTOMS: "Do you have any other symptoms?" (e.g., chest pain, back pain, breathing difficulty, swelling, rash, fever, numbness, weakness)     Back pain ,weakness of right leg . Now using walker . 7. PREGNANCY: "Is there any chance you are pregnant?" "When was your last menstrual period?"     na  Protocols used: Leg Pain-A-AH

## 2020-09-29 NOTE — Telephone Encounter (Signed)
C/o increasing pain right leg. Difficulty walking on right leg using walker now. Pain starts in back to "butt cheek" to inner thigh to knee to outer area of right leg. Pain worse past couple of weeks now affecting walking. Denies swelling of right leg , no redness or calf pain. Pain severe and is eased with tramadol but does not last long. Denies chest pain , difficulty breathing, no numbness or tingling reported. Weakness when trying to put weight on right leg. Reports hx "veins" in legs. Possible varicose veins "larger than they used to be" . Patient requesting for advise or additional pain medication to help with right leg pain until she can be seen by PCP 10/17/20 on scheduled appt. Patient does not want to make additional appt. Care advise given. Patient verbalized understanding of care advise and to call back or go to [facility] or ED if symptoms worsen. Requesting for refill of ultram sent to PCP.

## 2020-09-29 NOTE — Telephone Encounter (Signed)
Medication Refill - Medication: traMADol (ULTRAM) 50 MG tablet  Has the patient contacted their pharmacy? No. (Agent: If no, request that the patient contact the pharmacy for the refill.) (Agent: If yes, when and what did the pharmacy advise?)  Preferred Pharmacy (with phone number or street name): [id number] [id number] Phone:  [phone] Fax:  [phone]  Agent: Please be advised that RX refills may take up to 3 business days. We ask that you follow-up with your pharmacy.

## 2020-10-02 NOTE — Telephone Encounter (Signed)
Returned pt call and went over provider response pt is aware and doesn't have any questions or concerns

## 2020-10-07 ENCOUNTER — Other Ambulatory Visit: Payer: Self-pay | Admitting: Internal Medicine

## 2020-10-07 DIAGNOSIS — E1169 Type 2 diabetes mellitus with other specified complication: Secondary | ICD-10-CM

## 2020-10-08 ENCOUNTER — Other Ambulatory Visit: Payer: Self-pay | Admitting: Internal Medicine

## 2020-10-08 DIAGNOSIS — E119 Type 2 diabetes mellitus without complications: Secondary | ICD-10-CM

## 2020-10-17 ENCOUNTER — Encounter: Payer: Self-pay | Admitting: Internal Medicine

## 2020-10-17 ENCOUNTER — Ambulatory Visit: Payer: Medicare Other | Attending: Internal Medicine | Admitting: Internal Medicine

## 2020-10-17 ENCOUNTER — Other Ambulatory Visit: Payer: Self-pay

## 2020-10-17 ENCOUNTER — Other Ambulatory Visit: Payer: Self-pay | Admitting: Internal Medicine

## 2020-10-17 ENCOUNTER — Ambulatory Visit (HOSPITAL_COMMUNITY)
Admission: RE | Admit: 2020-10-17 | Discharge: 2020-10-17 | Disposition: A | Discharge status: Home / Self Care | Payer: Medicare Other | Source: Ambulatory Visit | Attending: Internal Medicine | Admitting: Internal Medicine

## 2020-10-17 VITALS — BP 141/81 | HR 70 | Resp 16 | Ht 64.5 in | Wt 220.0 lb

## 2020-10-17 DIAGNOSIS — M160 Bilateral primary osteoarthritis of hip: Secondary | ICD-10-CM | POA: Diagnosis not present

## 2020-10-17 DIAGNOSIS — M25551 Pain in right hip: Secondary | ICD-10-CM

## 2020-10-17 DIAGNOSIS — I83891 Varicose veins of right lower extremities with other complications: Secondary | ICD-10-CM

## 2020-10-17 DIAGNOSIS — E1169 Type 2 diabetes mellitus with other specified complication: Secondary | ICD-10-CM | POA: Diagnosis not present

## 2020-10-17 DIAGNOSIS — E669 Obesity, unspecified: Secondary | ICD-10-CM

## 2020-10-17 DIAGNOSIS — I10 Essential (primary) hypertension: Secondary | ICD-10-CM | POA: Diagnosis not present

## 2020-10-17 DIAGNOSIS — Z23 Encounter for immunization: Secondary | ICD-10-CM | POA: Diagnosis not present

## 2020-10-17 DIAGNOSIS — Z2821 Immunization not carried out because of patient refusal: Secondary | ICD-10-CM | POA: Diagnosis not present

## 2020-10-17 DIAGNOSIS — Z0000 Encounter for general adult medical examination without abnormal findings: Secondary | ICD-10-CM

## 2020-10-17 DIAGNOSIS — M79604 Pain in right leg: Secondary | ICD-10-CM

## 2020-10-17 MED ORDER — CARVEDILOL 6.25 MG PO TABS: 9.3750 mg | ORAL_TABLET | Freq: Two times a day (BID) | ORAL | 2 refills | Status: DC

## 2020-10-17 NOTE — Progress Notes (Signed)
Subjective:   Rachel Herman is a 70 y.o. female who presents for Medicare Annual (Subsequent) preventive examination. Pt with hx of HTN, DM, HL, DJD lumbar and spinal stenosis with hx of laminectomy 2016, obesity, depression, Ductal  CA LT s/p lumpectomy/XRT (2015 and 2016, followed by Dr. Annamaria Boots), vaginal prolapse, CKD 3, RT lung nodule (CTA 10/2018 - stable).  Review of Systems    C/o pain over RT shin and in thigh x 1 mth.  Veins prominent in RT leg but not tender to touch unless "you press hard on them.  Patient reports that the vein in the right leg has always been more pronounced than in the left but more prominent x 6 mths.  Pain in RT groin with movement and when she lays on RT side. Some flare in back but not as bad as earlier this yr.  No numbness or tingling in leg.  No shooting pain down the leg.   No falls.  Using cane more recently. No swelling in leg.  She had called about 2 weeks ago to report pain in the right leg.  I had recommended that she touch base with Dr. Ernestina Patches to see whether she would benefit from injection to the lower back again.  Patient states that she has been too busy to call.  She reports good relief of the pain with the tramadol.  CVD:  BP elev this a.m. Taking meds as prescribed and took already for the morning.  Stops by fire station intermittently to check BP and levels have been good..  Last was 136/82.  Endo:  BS this a.m was 110.  Checks BS every day in mornings.  Range 80-115.  Doind well with eating habits but some days, no appetite.  Can find with taking metformin       Objective:    Today's Vitals   10/17/20 1002  BP: (!) 141/81  Pulse: 70  Resp: 16  SpO2: 97%  Weight: 220 lb (99.8 kg)  Height: 5' 4.5" (1.638 m)  PainSc: 10-Worst pain ever   Body mass index is 37.18 kg/m. Repeat BP 141/96 Gen: Elderly Caucasian female in NAD. Neck: No lymphadenopathy.  No thyromegaly. Mouth: Oral mucosa is moist. Chest: Clear to auscultation  bilaterally. CVS: Regular rate and rhythm.  No gallops or murmurs. Abdomen: Normal bowel sounds.  Nondistended.  She has a compressible umbilical hernia. Extremities: Patient varicose veins in both lower legs and spider veins on the feet right greater than left.  Torturous serpiginous vein noted in the right lower leg anteriorly extending from the midfoot over the upper edge of the knee.  It does not appear inflamed.  It is not tender to touch. Legs are warm to touch.  Dorsalis pedis and posterior tibialis pulses 3+ bilaterally.  Popliteal pulses 2+ bilaterally.  Femoral pulses 3+ bilaterally. MSK: She has a 4 pronged cane with her that she uses intermittently.  Gait is slowed. Knees: Mild joint enlargement.  Good passive range of motion.  No crepitus appreciated. Right hip: Moderate discomfort with active and attempted passive range of motion especially rotation.  Mild tenderness on palpation over the right hip/trochanteric area. No hernia noted in the right groin. Neuro: Power in the lower extremities 5/5 bilaterally. Advanced Directives 10/17/2020 09/14/2019 03/17/2019 10/19/2018 03/15/2018 11/08/2016 11/01/2016  Does Patient Have a Medical Advance Directive? No No No No No No No  Type of Advance Directive - - - - - - -  Does patient want to make changes  to medical advance directive? - - - - - - -  Would patient like information on creating a medical advance directive? Yes (Inpatient - patient defers creating a medical advance directive at this time - Information given) Yes (Inpatient - patient defers creating a medical advance directive at this time - Information given) - No - Patient declined No - Patient declined - Yes (MAU/Ambulatory/Procedural Areas - Information given)    Current Medications (verified) Outpatient Encounter Medications as of 10/17/2020  Medication Sig   albuterol (VENTOLIN HFA) 108 (90 Base) MCG/ACT inhaler Inhale 2 puffs into the lungs every 6 (six) hours as needed for wheezing  or shortness of breath.   anastrozole (ARIMIDEX) 1 MG tablet TAKE 1 TABLET BY MOUTH EVERY DAY (Patient not taking: Reported on 10/17/2020)   Ascorbic Acid (VITAMIN C) 1000 MG tablet Take 1,000 mg by mouth daily.   aspirin EC 325 MG tablet Take 325 mg by mouth daily.   Blood Glucose Monitoring Suppl (ONE TOUCH ULTRA MINI) w/Device KIT USE AS DIRECTED ONCE DAILY DX CODE E11.9   carvedilol (COREG) 6.25 MG tablet TAKE 1 TABLET BY MOUTH 2 TIMES DAILY WITH A MEAL.   cloNIDine (CATAPRES) 0.1 MG tablet TAKE 1 TABLET (0.1 MG TOTAL) BY MOUTH 2 (TWO) TIMES DAILY.   [facility] SENNA PLUS 8.6-50 MG tablet TAKE 2 TABLETS BY MOUTH AT BEDTIME AS NEEDED FOR MILD CONSTIPATION (Patient taking differently: Take 1 tablet by mouth at bedtime.)   DULoxetine (CYMBALTA) 20 MG capsule TAKE 1 CAPSULE BY MOUTH EVERY DAY   gabapentin (NEURONTIN) 300 MG capsule TAKE 2 CAPSULES (600 MG) BY MOUTH 2 TIMES DAILY.   hydrochlorothiazide (HYDRODIURIL) 25 MG tablet TAKE 1 TABLET BY MOUTH EVERYDAY AT BEDTIME   metFORMIN (GLUCOPHAGE) 500 MG tablet TAKE 1 & 1/2 TABLETS (750 MG) BY MOUTH 2 TIMES DAILY.   Multiple Vitamins-Minerals (MULTIVITAMIN WITH MINERALS) tablet Take 1 tablet by mouth daily. Reported on 03/13/2015   omeprazole (PRILOSEC) 20 MG capsule Take 1 capsule (20 mg total) by mouth 2 (two) times daily before a meal.   ONETOUCH DELICA LANCETS 16X MISC USE AS DIRECTED ONCE DAILY DX CODE E11.9   ONETOUCH ULTRA test strip TEST ONCE DAILY AS DIRECTED E11.9   potassium chloride (KLOR-CON) 10 MEQ tablet TAKE 1 TABLET BY MOUTH EVERY DAY   pravastatin (PRAVACHOL) 20 MG tablet TAKE 1 TABLET BY MOUTH EVERY DAY   traMADol (ULTRAM) 50 MG tablet Take 1 tablet (50 mg total) by mouth every 4 (four) hours as needed (Each prescription to last 1 month).   No facility-administered encounter medications on file as of 10/17/2020.    Allergies (verified) Patient has no known allergies.   History: Past Medical History:  Diagnosis Date   Breast cancer  ([place]) 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   Diabetes mellitus ([place]) 09/16/13   Diagnosed on 09/16/13; HgA1C was 7.2/metformin   Dizziness    GERD (gastroesophageal reflux disease)    Headache    Hyperlipidemia    Hypertension 2014   Low back pain    Obesity, morbid, BMI 40.0-49.9 (HCC)    PONV (postoperative nausea and vomiting)    S/P radiation therapy 04/05/14-05/20/14   left breast 60.4Gy totaldose   Past Surgical History:  Procedure Laterality Date   ABDOMINAL HYSTERECTOMY N/A 09/20/2013   Procedure: HYSTERECTOMY ABDOMINAL;  Surgeon: Osborne Oman, MD;  Location: [facility];  Service: Gynecology;  Laterality: N/A;   BREAST LUMPECTOMY Left 2015   radiation   BREAST LUMPECTOMY  WITH AXILLARY LYMPH NODE BIOPSY Left 12/29/13   left breast    CATARACT EXTRACTION Right    LAPAROSCOPIC ASSISTED VAGINAL HYSTERECTOMY  09/20/2013   Procedure: LAPAROSCOPIC ASSISTED VAGINAL HYSTERECTOMY;  Surgeon: Osborne Oman, MD;  Location: [facility];  Service: Gynecology;;  PT WAS EXAMINED WHILE UP IN [place] AND IT WAS DECIDED TO OPEN PT DUE TO LARGE MASS   LUMBAR LAMINECTOMY/DECOMPRESSION MICRODISCECTOMY Right 12/14/2014   Procedure: Right Lumbar three-four microdiskectomy;  Surgeon: Newman Pies, MD;  Location: [facility];  Service: Neurosurgery;  Laterality: Right;  Right Lumbar three-four microdiskectomy   RADIOACTIVE SEED GUIDED PARTIAL MASTECTOMY WITH AXILLARY SENTINEL LYMPH NODE BIOPSY Left 12/29/2013   Procedure: LEFT BREAST RADIOACTIVE SEED LOCALIZED LUMPECTOMY WITH SENTINEL LYMPH NODE MAPPING;  Surgeon: Erroll Luna, MD;  Location: [facility];  Service: General;  Laterality: Left;   RE-EXCISION OF BREAST LUMPECTOMY Left 03/02/2014   Procedure: RE-EXCISION OF LEFT BREAST LUMPECTOMY;  Surgeon: Erroll Luna, MD;  Location: [facility];  Service: General;  Laterality: Left;   SALPINGOOPHORECTOMY  09/20/2013   Procedure: SALPINGO OOPHORECTOMY;  Surgeon: Osborne Oman, MD;  Location: [facility];  Service: Gynecology;;   Family History  Problem Relation Age of Onset   Diabetes Mother    Multiple myeloma Mother 11   Hearing loss Mother    Diabetes Brother    Cancer Brother 54       prostate cancer    Diabetes Maternal Aunt    Leukemia Maternal Aunt        dx in her 48s   Alcoholism Brother    Heart attack Father    Diabetes Sister    Diabetes Son    Social History   Socioeconomic History   Marital status: Married    Spouse name: Katherin Ramey    Number of children: 3   Years of education: 12   Highest education level: Not on file  Occupational History    Employer: UNEMPLOYED  Tobacco Use   Smoking status: Never   Smokeless tobacco: Never  Vaping Use   Vaping Use: Never used  Substance and Sexual Activity   Alcohol use: No   Drug use: No   Sexual activity: Not Currently    Birth control/protection: Post-menopausal  Other Topics Concern   Not on file  Social History Narrative   Married to [employer] in 1986.    Has 3 children from previous marriage, 1 in [place], 1 in [place], 1 in prison ([place]).    Lives with husband.    Right-handed.   1 cup caffeine per day.   Social Determinants of Health   Financial Resource Strain: Not on file  Food Insecurity: Not on file  Transportation Needs: Not on file  Physical Activity: Not on file  Stress: Not on file  Social Connections: Not on file    Tobacco Counseling Patient is a non-smoker.  Clinical Intake:  Pain : 0-10 Pain Score: 10-Worst pain ever Pain Location: Leg Pain Orientation: Right     Diabetes: Yes CBG done?: No See discussion above.  Activities of Daily Living In your present state of health, do you have any difficulty performing the following activities: 10/17/2020  Hearing? N  Vision? N  Difficulty concentrating or making decisions? N  Walking or climbing stairs? Y  Dressing or bathing? N  Doing errands, shopping? N  Preparing Food  and eating ? N  Using the Toilet? N  In the past six months, have you  accidently leaked urine? N  Do you have problems with loss of bowel control? N  Managing your Medications? N  Managing your Finances? N  Housekeeping or managing your Housekeeping? N  Some recent data might be hidden   Patient care team: Ladell Pier, MD as PCP - General (Internal Medicine) Erroll Luna, MD as Consulting Physician (General Surgery) Truitt Merle, MD as Consulting Physician (Hematology) Kyung Rudd, MD as Consulting Physician (Radiation Oncology) Dr. Arnoldo Morale - [facility] - Back surgeon  Dr. Marlou Sa - [facility] - OA hips  Dr.Richard Grout- Ophthalmologist  Indicate any recent Medical Services you may have received from other than [facility] providers in the past year (date may be approximate).     Assessment:   This is a routine wellness examination for Geraldene.  Hearing/Vision screen Vision Screening   Right eye Left eye Both eyes  Without correction '20 30 20 30 20 30  ' With correction      Does wear glasses.  Did not not bring them today  Dietary issues and exercise activities discussed: See discussion above under review of systems.   Goals Addressed   None   Depression Screen PHQ 2/9 Scores 10/18/2019 09/14/2019 08/03/2018 04/23/2018 03/18/2018 01/22/2018 10/23/2017  PHQ - 2 Score 0 0 0 0 3 0 0  PHQ- 9 Score - - '2 3 12 ' - 3    Fall Risk Fall Risk  10/17/2020 06/16/2020 09/14/2019 03/04/2019 08/03/2018  Falls in the past year? 0 0 1 0 0  Number falls in past yr: 0 0 1 - -  Injury with Fall? 0 0 0 - -  Risk for fall due to : No Fall Risks No Fall Risks - - -  No major issues with depression  FALL RISK PREVENTION PERTAINING TO THE HOME:  Any stairs in or around the home? Yes - porch front and back If so, are there any without handrails? No   Home free of loose throw rugs in walkways, pet beds, electrical cords, etc? Yes  Adequate lighting in your home to reduce risk of falls? Yes    ASSISTIVE DEVICES UTILIZED TO PREVENT FALLS:  Life alert? No  no.  Has an alarm system Use of a cane, walker or w/c? Yes   Grab bars in the bathroom? Yes  Shower chair or bench in shower? Yes  Elevated toilet seat or a handicapped toilet? Yes  yes  TIMED UP AND GO:  Was the test performed? Yes .  Length of time to ambulate 10 feet: 14 sec.  Gait is slow with low foot to floor clearance.  Patient did not use her cane when ambulating even though she had it with her.  Cognitive Function: MMSE - Mini Mental State Exam 10/17/2020 09/14/2019  Orientation to time 5 5  Orientation to Place 5 5  Registration 3 3  Attention/ Calculation 5 5  Recall 3 3  Language- name 2 objects 2 2  Language- repeat 0 1  Language- follow 3 step command 3 3  Language- read & follow direction 1 1  Write a sentence 1 1  Copy design 0 0  Total score 28 29        Immunizations Immunization History  Administered Date(s) Administered   Fluad Quad(high Dose 65+) 10/22/2018   Influenza, High Dose Seasonal PF 10/22/2018   Influenza,inj,Quad PF,6+ Mos 12/26/2015, 11/01/2016, 10/23/2017, 10/18/2019   Influenza-Unspecified 12/26/2015, 11/01/2016, 10/23/2017   PFIZER(Purple Top)SARS-COV-2 Vaccination 04/05/2019, 04/28/2019, 02/15/2020   Tdap 03/30/2014  TDAP status: Up to date  Flu Vaccine status: Completed at today's visit  Pneumococcal vaccine status: Declined,  Education has been provided regarding the importance of this vaccine but patient still declined. Advised may receive this vaccine at local pharmacy or Health Dept. Aware to provide a copy of the vaccination record if obtained from local pharmacy or Health Dept. Verbalized acceptance and understanding.   Covid-19 vaccine status: Completed vaccines.  Will be getting a booster this fall.  Qualifies for Shingles Vaccine? Yes   Zostavax completed No   Shingrix Completed?: No.    Education has been provided regarding the importance of this  vaccine. Patient has been advised to call insurance company to determine out of pocket expense if they have not yet received this vaccine. Advised may also receive vaccine at local pharmacy or Health Dept. Verbalized acceptance and understanding. Does not want.  Not cover Screening Tests Health Maintenance  Topic Date Due   Zoster Vaccines- Shingrix (1 of 2) Never done   COVID-19 Vaccine (4 - Booster for Pfizer series) 05/09/2020   FOOT EXAM  06/07/2020   INFLUENZA VACCINE  09/04/2020   PNA vac Low Risk Adult (1 of 2 - PCV13) 10/17/2020 (Originally 12/30/2014)   HEMOGLOBIN A1C  12/17/2020   OPHTHALMOLOGY EXAM  08/23/2021   COLONOSCOPY (Pts 45-38yr Insurance coverage will need to be confirmed)  09/14/2021   MAMMOGRAM  03/28/2022   TETANUS/TDAP  03/30/2024   DEXA SCAN  Completed   Hepatitis C Screening  Completed   HPV VACCINES  Aged Out   COLON CANCER SCREENING ANNUAL FOBT  Discontinued    Health Maintenance  Health Maintenance Due  Topic Date Due   Zoster Vaccines- Shingrix (1 of 2) Never done   COVID-19 Vaccine (4 - Booster for Pfizer series) 05/09/2020   FOOT EXAM  06/07/2020   INFLUENZA VACCINE  09/04/2020    Colorectal cancer screening: Type of screening: Colonoscopy. Completed 09/2018. Repeat every 09/2021 years  Mammogram status: Completed 03/2020. Repeat every year  Bone Density status: Completed 03/2019. Results reflect: Bone density results: NORMAL. Repeat every 3-5 years.  Lung Cancer Screening: (Low Dose CT Chest recommended if Age 521-80years, 30 pack-year currently smoking OR have quit w/in 15years.) does not qualify.   Lung Cancer Screening Referral: NA  Additional Screening:  Hepatitis C Screening: does qualify; Completed 02/2015  Vision Screening: Recommended annual ophthalmology exams for early detection of glaucoma and other disorders of the eye. Is the patient up to date with their annual eye exam?  Yes  Who is the provider or what is the name of the  office in which the patient attends annual eye exams? Dr. GSchuyler AmorIf pt is not established with a provider, would they like to be referred to a provider to establish care?  NA .   Dental Screening: Recommended annual dental exams for proper oral hygiene. Currently does not have dentist.  Last seen 3 yrs ago  Community Resource Referral / Chronic Care Management: CRR required this visit?  No   CCM required this visit?  No      Plan:   1. Encounter for Medicare annual wellness exam Discussed advanced directive with patient.  She was given a packet that she can read and that has forms to allow her to execute a living will or healthcare power of attorney.  I request that she make a copy for our office should she decide to execute 1 or both of these documents.  2. Essential hypertension Not  at goal being 130/80 or lower.  I recommend increasing the carvedilol to 1-1/2 tablets twice a day. - carvedilol (COREG) 6.25 MG tablet; Take 1.5 tablets (9.375 mg total) by mouth 2 (two) times daily with a meal.  Dispense: 270 tablet; Refill: 2  3. Type 2 diabetes mellitus with obesity ([place]) Reported blood sugar readings at goal.  She will continue metformin.  She has defered on having A1c checked today.  4. Hip pain, right History and exam suggest that she may have significant osteoarthritis of the right hip.  She is agreeable to doing x-rays.  Further management will be based on results.  She will continue the tramadol.  5. Pain of right lower extremity Will do a Doppler ultrasound on the right lower extremity to screen for DVT or superficial venous thrombosis. - VAS [place] LOWER EXTREMITY VENOUS (DVT); Future  6. Symptomatic varicose veins, right Recommend purchasing a pair of below the knee compression socks and wearing it daily when she is up and mobile.  7. Need for influenza vaccination Given today by CMA Pollock  8. Pneumococcal vaccination declined Recommended.  Pt declined.   I have  personally reviewed and noted the following in the patient's chart:   Medical and social history Use of alcohol, tobacco or illicit drugs  Current medications and supplements including opioid prescriptions.  Functional ability and status Nutritional status Physical activity Advanced directives List of other physicians Hospitalizations, surgeries, and ER visits in previous 12 months Vitals Screenings to include cognitive, depression, and falls Referrals and appointments  In addition, I have reviewed and discussed with patient certain preventive protocols, quality metrics, and best practice recommendations. A written personalized care plan for preventive services as well as general preventive health recommendations were provided to patient.     Karle Plumber, MD   10/17/2020

## 2020-10-17 NOTE — Progress Notes (Signed)
Lower extremity venous has been completed.   Preliminary results in CV Proc.   Rachel Herman 10/17/2020 11:39 AM

## 2020-10-17 NOTE — Patient Instructions (Signed)
I have referred you for Doppler ultrasound of your right leg.  When you go to have this done, you should get the x-ray of your right hip done at the same time.  Purchase and use a pair of compression socks that falls below the knees.  Your blood pressure is not at goal.  We have increased the carvedilol to 1-1/2 tablets twice a day.  Continue to check your blood pressure at least once a week with goal being 130/80 or lower.  If you do execute a living will or healthcare power of attorney, please bring a copy for our records.

## 2020-10-18 ENCOUNTER — Other Ambulatory Visit: Payer: Self-pay | Admitting: Internal Medicine

## 2020-10-18 ENCOUNTER — Telehealth: Payer: Self-pay | Admitting: Internal Medicine

## 2020-10-18 DIAGNOSIS — M1611 Unilateral primary osteoarthritis, right hip: Secondary | ICD-10-CM

## 2020-10-18 NOTE — Telephone Encounter (Signed)
Phone call placed to patient this morning.  Patient informed that the Doppler ultrasound was negative for blood clots in the legs.  Patient expressed understanding of the results.  Results of the x-ray of the right hip is not back as yet.  I told her we will let her know once we get that report.

## 2020-10-19 ENCOUNTER — Telehealth: Payer: Self-pay

## 2020-10-19 NOTE — Telephone Encounter (Signed)
Contacted pt to go over lab results pt didn't answer lvm asking pt to give me a call at her/his earliest convenience   Sent a CRM and forward labs to NT to give pt labs when they call back

## 2020-10-31 ENCOUNTER — Other Ambulatory Visit: Payer: Self-pay | Admitting: Nurse Practitioner

## 2020-10-31 ENCOUNTER — Telehealth: Payer: Self-pay | Admitting: Internal Medicine

## 2020-10-31 DIAGNOSIS — G8929 Other chronic pain: Secondary | ICD-10-CM

## 2020-10-31 MED ORDER — TRAMADOL HCL 50 MG PO TABS: 50.0000 mg | ORAL_TABLET | ORAL | 0 refills | Status: DC | PRN

## 2020-10-31 NOTE — Telephone Encounter (Signed)
Will forward to covering provider.

## 2020-10-31 NOTE — Telephone Encounter (Signed)
Medication Refill - Medication: traMADol (ULTRAM) 50 MG tablet  Pt took her last pill this am.  Has the patient contacted their pharmacy? Yes.   (Agent: If no, request that the patient contact the pharmacy for the refill.) (Agent: If yes, when and what did the pharmacy advise?)  Preferred Pharmacy (with phone number or street name):  [id number] [place], [place] - [address]  [address] [place] [address]  Phone: [phone] Fax: [phone]   Has the patient been seen for an appointment in the last year OR does the patient have an upcoming appointment? Yes.    Agent: Please be advised that RX refills may take up to 3 business days. We ask that you follow-up with your pharmacy.

## 2020-11-06 ENCOUNTER — Ambulatory Visit (INDEPENDENT_AMBULATORY_CARE_PROVIDER_SITE_OTHER): Payer: Medicare Other | Admitting: Orthopedic Surgery

## 2020-11-06 ENCOUNTER — Encounter: Payer: Self-pay | Admitting: Orthopedic Surgery

## 2020-11-06 ENCOUNTER — Other Ambulatory Visit: Payer: Self-pay

## 2020-11-06 DIAGNOSIS — M25551 Pain in right hip: Secondary | ICD-10-CM

## 2020-11-06 NOTE — Progress Notes (Signed)
Office Visit Note   Patient: Rachel Herman           Date of Birth: 12/25/1950           MRN: [id number] Visit Date: 11/06/2020 Requested by: Ladell Pier, MD [place],  [place] [address] PCP: Ladell Pier, MD  Subjective: Chief Complaint  Patient presents with   Right Hip - Pain    HPI: Rachel Herman is a patient with right hip pain.  She has had pain for several months.  Has known radiographic right hip arthritis.  Prior injections by Dr. Ernestina Patches gave her relief initially for a year and then the most recent one for only 2 months.  The pain wakes her from sleep at night.  She does use a cane.  Reports groin pain.              ROS: All systems reviewed are negative as they relate to the chief complaint within the history of present illness.  Patient denies  fevers or chills.   Assessment & Plan: Visit Diagnoses:  1. Pain in right hip     Plan: Impression is right hip arthritis.  We talked about the risk and benefits of total hip replacement today.  We also talked about another hip injection.  She is not really ready to face hip replacement yet from an emotional and physical standpoint.  If we do do the hip injection this week then that is going to set her clot back about 3 months from getting the hip replacement.  She wants to try the hip injection for now and then we will see her back as needed when the pain recurs.  Does not look like it is coming from the back based on examination and radiographs today.  Follow-Up Instructions: Return if symptoms worsen or fail to improve.   Orders:  Orders Placed This Encounter  Procedures   Ambulatory referral to Physical Medicine Rehab   No orders of the defined types were placed in this encounter.     Procedures: No procedures performed   Clinical Data: No additional findings.  Objective: Vital Signs: There were no vitals taken for this visit.  Physical Exam:   Constitutional: Patient appears  well-developed HEENT:  Head: Normocephalic Eyes:EOM are normal Neck: Normal range of motion Cardiovascular: Normal rate Pulmonary/chest: Effort normal Neurologic: Patient is alert Skin: Skin is warm Psychiatric: Patient has normal mood and affect   Ortho Exam: Ortho exam demonstrates full active and passive range of motion of the knees with no nerve root tension signs.  Patient has diminished internal rotation on the right which is painful.  Not the case on the left-hand side.  Hip flexion abduction adduction strength intact and pedal pulses intact with good ankle dorsiflexion plantarflexion strength.  Does have slight antalgic gait to the right which may be an early Trendelenburg type gait.  Specialty Comments:  No specialty comments available.  Imaging: No results found.   PMFS History: Patient Active Problem List   Diagnosis Date Noted   Pneumococcal vaccination declined 10/18/2019   Chronic cough 08/03/2018   Other intervertebral disc degeneration, lumbar region 03/18/2018   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/21/2016   Lumbar pain with radiation down left and right leg 07/02/2016   Thyroid nodule 06/09/2015   Nodule of right lung 07/28/2014   Diverticulosis of colon without hemorrhage 07/28/2014   DJD (degenerative joint disease), lumbar 02/22/2014  Lumbar pain with radiation down left leg 01/17/2014   Breast cancer of upper-inner quadrant of left female breast ([facility]) 01/05/2014   Hyperlipidemia associated with type 2 diabetes mellitus ([facility]) 09/28/2013   S/P total hysterectomy and bilateral salpingo-oophorectomy 09/20/2013   Essential hypertension    Diabetes mellitus ([facility]) 09/16/2013   Past Medical History:  Diagnosis Date   Breast cancer ([facility]) 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   Diabetes mellitus ([facility]) 09/16/13   Diagnosed on 09/16/13; HgA1C was 7.2/metformin   Dizziness    GERD  (gastroesophageal reflux disease)    Headache    Hyperlipidemia    Hypertension 2014   Low back pain    Obesity, morbid, BMI 40.0-49.9 (HCC)    PONV (postoperative nausea and vomiting)    S/P radiation therapy 04/05/14-05/20/14   left breast 60.4Gy totaldose    Family History  Problem Relation Age of Onset   Diabetes Mother    Multiple myeloma Mother 26   Hearing loss Mother    Diabetes Brother    Cancer Brother 60       prostate cancer    Diabetes Maternal Aunt    Leukemia Maternal Aunt        dx in her 31s   Alcoholism Brother    Heart attack Father    Diabetes Sister    Diabetes Son     Past Surgical History:  Procedure Laterality Date   ABDOMINAL HYSTERECTOMY N/A 09/20/2013   Procedure: HYSTERECTOMY ABDOMINAL;  Surgeon: Osborne Oman, MD;  Location: [facility];  Service: Gynecology;  Laterality: N/A;   BREAST LUMPECTOMY Left 2015   radiation   BREAST LUMPECTOMY WITH AXILLARY LYMPH NODE BIOPSY Left 12/29/13   left breast    CATARACT EXTRACTION Right    LAPAROSCOPIC ASSISTED VAGINAL HYSTERECTOMY  09/20/2013   Procedure: LAPAROSCOPIC ASSISTED VAGINAL HYSTERECTOMY;  Surgeon: Osborne Oman, MD;  Location: [facility];  Service: Gynecology;;  PT WAS EXAMINED WHILE UP IN [place] AND IT WAS DECIDED TO OPEN PT DUE TO LARGE MASS   LUMBAR LAMINECTOMY/DECOMPRESSION MICRODISCECTOMY Right 12/14/2014   Procedure: Right Lumbar three-four microdiskectomy;  Surgeon: Newman Pies, MD;  Location: [facility];  Service: Neurosurgery;  Laterality: Right;  Right Lumbar three-four microdiskectomy   RADIOACTIVE SEED GUIDED PARTIAL MASTECTOMY WITH AXILLARY SENTINEL LYMPH NODE BIOPSY Left 12/29/2013   Procedure: LEFT BREAST RADIOACTIVE SEED LOCALIZED LUMPECTOMY WITH SENTINEL LYMPH NODE MAPPING;  Surgeon: Erroll Luna, MD;  Location: [facility];  Service: General;  Laterality: Left;   RE-EXCISION OF BREAST LUMPECTOMY Left 03/02/2014   Procedure: RE-EXCISION OF LEFT BREAST LUMPECTOMY;   Surgeon: Erroll Luna, MD;  Location: [facility];  Service: General;  Laterality: Left;   SALPINGOOPHORECTOMY  09/20/2013   Procedure: SALPINGO OOPHORECTOMY;  Surgeon: Osborne Oman, MD;  Location: [facility];  Service: Gynecology;;   Social History   Occupational History    Employer: UNEMPLOYED  Tobacco Use   Smoking status: Never   Smokeless tobacco: Never  Vaping Use   Vaping Use: Never used  Substance and Sexual Activity   Alcohol use: No   Drug use: No   Sexual activity: Not Currently    Birth control/protection: Post-menopausal

## 2020-11-08 ENCOUNTER — Ambulatory Visit: Payer: Self-pay

## 2020-11-08 ENCOUNTER — Encounter: Payer: Self-pay | Admitting: Physical Medicine and Rehabilitation

## 2020-11-08 ENCOUNTER — Ambulatory Visit (INDEPENDENT_AMBULATORY_CARE_PROVIDER_SITE_OTHER): Payer: Medicare Other | Admitting: Physical Medicine and Rehabilitation

## 2020-11-08 ENCOUNTER — Other Ambulatory Visit: Payer: Self-pay

## 2020-11-08 DIAGNOSIS — M25551 Pain in right hip: Secondary | ICD-10-CM | POA: Diagnosis not present

## 2020-11-08 NOTE — Progress Notes (Signed)
Pt state right hip pain. Pt state bending and walking makes the pain worse. Pt state she takes pain meds to help ease her pain.  Numeric Pain Rating Scale and Functional Assessment Average Pain 10   In the last MONTH (on 0-10 scale) has pain interfered with the following?  1. General activity like being  able to carry out your everyday physical activities such as walking, climbing stairs, carrying groceries, or moving a chair?  Rating(10)   +Driver, -BT, -Dye Allergies.

## 2020-11-12 MED ORDER — BUPIVACAINE HCL 0.25 % IJ SOLN
4.0000 mL | INTRAMUSCULAR | Status: AC | PRN
  Administered 2020-11-08: 4 mL via INTRA_ARTICULAR

## 2020-11-12 MED ORDER — TRIAMCINOLONE ACETONIDE 40 MG/ML IJ SUSP
60.0000 mg | INTRAMUSCULAR | Status: AC | PRN
Start: 2020-11-08 — End: 2020-11-08
  Administered 2020-11-08: 60 mg via INTRA_ARTICULAR

## 2020-11-12 NOTE — Progress Notes (Signed)
   Rachel Herman - 70 y.o. female MRN [id number]  Date of birth: 08-Aug-1950  Office Visit Note: Visit Date: 11/08/2020 PCP: Ladell Pier, MD Referred by: Ladell Pier, MD  Subjective: Chief Complaint  Patient presents with   Right Hip - Pain   HPI:  Rachel Herman is a 70 y.o. female who comes in today at the request of Dr. Anderson Malta for planned Right anesthetic hip arthrogram with fluoroscopic guidance.  The patient has failed conservative care including home exercise, medications, time and activity modification.  This injection will be diagnostic and hopefully therapeutic.  Please see requesting physician notes for further details and justification.  She also has an extensive lumbar spine history status post fusion.  She does get both anterior hip pain and posterior hip pain.  She will monitor relief of symptoms today during the anesthetic phase and follow-up with Dr. Marlou Sa if all of the symptoms or not improved.   ROS Otherwise per HPI.  Assessment & Plan: Visit Diagnoses:    ICD-10-CM   1. Pain in right hip  M25.551 XR C-ARM NO REPORT      Plan: No additional findings.   Meds & Orders: No orders of the defined types were placed in this encounter.   Orders Placed This Encounter  Procedures   Large Joint Inj   XR C-ARM NO REPORT    Follow-up: Return for visit to requesting physician as needed.   Procedures: Large Joint Inj: R hip joint on 11/08/2020 2:15 PM Indications: diagnostic evaluation and pain Details: 22 G 3.5 in needle, fluoroscopy-guided anterior approach  Arthrogram: No  Medications: 4 mL bupivacaine 0.25 %; 60 mg triamcinolone acetonide 40 MG/ML Outcome: tolerated well, no immediate complications  There was excellent flow of contrast producing a partial arthrogram of the hip. The patient did have relief of anterior symptoms during the anesthetic phase of the injection. Procedure, treatment alternatives, risks and benefits explained,  specific risks discussed. Consent was given by the patient. Immediately prior to procedure a time out was called to verify the correct patient, procedure, equipment, support staff and site/side marked as required. Patient was prepped and draped in the usual sterile fashion.         Clinical History: 08/25/2019 Hip xray: AP pelvis lateral bilateral hips reviewed.  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  Mild to moderate  arthritis present in the left hip.  No acute fracture.  No other bony  pelvic abnormality.     Objective:  VS:  HT:    WT:   BMI:     BP:   HR: bpm  TEMP: ( )  RESP:  Physical Exam   Imaging: No results found.

## 2020-11-29 ENCOUNTER — Other Ambulatory Visit: Payer: Self-pay | Admitting: Internal Medicine

## 2020-11-29 DIAGNOSIS — G8929 Other chronic pain: Secondary | ICD-10-CM

## 2020-11-29 NOTE — Telephone Encounter (Signed)
Medication Refill - Medication: Tramadol   Has the patient contacted their pharmacy? No. Pt states that "She only gets a 30 day supply". Please advise.  (Agent: If no, request that the patient contact the pharmacy for the refill. If patient does not wish to contact the pharmacy document the reason why and proceed with request.) (Agent: If yes, when and what did the pharmacy advise?)  Preferred Pharmacy (with phone number or street name):  [id number] - [place], [place] - [address]  [address] [place] [address]  Phone: [phone] Fax: [phone]  Hours: Not open 24 hours   Has the patient been seen for an appointment in the last year OR does the patient have an upcoming appointment? Yes.    Agent: Please be advised that RX refills may take up to 3 business days. We ask that you follow-up with your pharmacy.

## 2020-11-29 NOTE — Telephone Encounter (Signed)
Requested medications are due for refill today.  yes  Requested medications are on the active medications list.  yes  Last refill. 10/31/2020  Future visit scheduled.   yes  Notes to clinic.  Medication not delegated.

## 2020-12-01 ENCOUNTER — Telehealth: Payer: Self-pay | Admitting: Internal Medicine

## 2020-12-01 MED ORDER — TRAMADOL HCL 50 MG PO TABS: 50.0000 mg | ORAL_TABLET | ORAL | 1 refills | Status: DC | PRN

## 2020-12-01 NOTE — Telephone Encounter (Signed)
Copied from [place] [id number]. Topic: General - Inquiry >> Nov 28, 2020  3:34 PM Greggory Keen D wrote: Reason for CRM: Pt called saying she got a recording saying it was time for her A1c.  She does not have an appt intil Jan with Dr. Wynetta Emery  CB#  [phone]

## 2020-12-03 ENCOUNTER — Other Ambulatory Visit: Payer: Self-pay | Admitting: Internal Medicine

## 2020-12-03 DIAGNOSIS — I10 Essential (primary) hypertension: Secondary | ICD-10-CM

## 2020-12-03 NOTE — Telephone Encounter (Signed)
Requested Prescriptions  Pending Prescriptions Disp Refills  . cloNIDine (CATAPRES) 0.1 MG tablet [Pharmacy Med Name: CLONIDINE HCL 0.1 MG TABLET] 180 tablet 0    Sig: TAKE 1 TABLET BY MOUTH 2 TIMES DAILY.     Cardiovascular:  Alpha-2 Agonists Failed - 12/03/2020 12:46 AM      Failed - Last BP in normal range    BP Readings from Last 1 Encounters:  10/17/20 (!) 141/81         Passed - Last Heart Rate in normal range    Pulse Readings from Last 1 Encounters:  10/17/20 70         Passed - Valid encounter within last 6 months    Recent Outpatient Visits          1 month ago Encounter for [employer] annual wellness exam   [place] Ladell Pier, MD   4 months ago Muscular cramp   [place], MD   5 months ago Type 2 diabetes mellitus with obesity [facility])   [place], MD   9 months ago Type 2 diabetes mellitus with obesity [facility])   [place] Ladell Pier, MD   1 year ago Need for influenza vaccination   [place], RPH-CPP      Future Appointments            In 2 months Wynetta Emery Dalbert Batman, MD [place]

## 2020-12-04 NOTE — Telephone Encounter (Signed)
Returned pt call and made that she can get A1C done at her appointment in January

## 2021-01-01 ENCOUNTER — Other Ambulatory Visit: Payer: Self-pay | Admitting: Internal Medicine

## 2021-01-01 DIAGNOSIS — E119 Type 2 diabetes mellitus without complications: Secondary | ICD-10-CM

## 2021-01-03 ENCOUNTER — Ambulatory Visit: Payer: Self-pay

## 2021-01-03 ENCOUNTER — Other Ambulatory Visit: Payer: Self-pay | Admitting: Internal Medicine

## 2021-01-03 DIAGNOSIS — G8929 Other chronic pain: Secondary | ICD-10-CM

## 2021-01-03 NOTE — Telephone Encounter (Signed)
Pt has a terrible cough, sinus congestion,  no sob.pt is wanting the dr to call in antibiotic, last seen September, willing to do a phone call, can not do MyChart or computer. Wants medication today. No appt available [phone]   Pt. Started having symptoms Sunday - productive cough, runny nose. Requesting a phone visit. Asking for an antibiotic.Cannot do virtual visit. Please advise pt.    Answer Assessment - Initial Assessment Questions 1. ONSET: "When did the cough begin?"      Sunday 2. SEVERITY: "How bad is the cough today?"      Severe 3. SPUTUM: "Describe the color of your sputum" (none, dry cough; clear, white, yellow, green)     Light green 4. HEMOPTYSIS: "Are you coughing up any blood?" If so ask: "How much?" (flecks, streaks, tablespoons, etc.)     No 5. DIFFICULTY BREATHING: "Are you having difficulty breathing?" If Yes, ask: "How bad is it?" (e.g., mild, moderate, severe)    - MILD: No SOB at rest, mild SOB with walking, speaks normally in sentences, can lie down, no retractions, pulse < 100.    - MODERATE: SOB at rest, SOB with minimal exertion and prefers to sit, cannot lie down flat, speaks in phrases, mild retractions, audible wheezing, pulse 100-120.    - SEVERE: Very SOB at rest, speaks in single words, struggling to breathe, sitting hunched forward, retractions, pulse > 120      Mild 6. FEVER: "Do you have a fever?" If Yes, ask: "What is your temperature, how was it measured, and when did it start?"     No 7. CARDIAC HISTORY: "Do you have any history of heart disease?" (e.g., heart attack, congestive heart failure)      No 8. LUNG HISTORY: "Do you have any history of lung disease?"  (e.g., pulmonary embolus, asthma, emphysema)     No 9. PE RISK FACTORS: "Do you have a history of blood clots?" (or: recent major surgery, recent prolonged travel, bedridden)     No 10. OTHER SYMPTOMS: "Do you have any other symptoms?" (e.g., runny nose, wheezing, chest pain)        Runny nose 11. PREGNANCY: "Is there any chance you are pregnant?" "When was your last menstrual period?"       No 12. TRAVEL: "Have you traveled out of the country in the last month?" (e.g., travel history, exposures)       No  Protocols used: Cough - Acute Productive-A-AH

## 2021-01-03 NOTE — Telephone Encounter (Signed)
Copied from [place] [id number]. Topic: Quick Communication - Rx Refill/Question >> Jan 03, 2021 10:58 AM Loma Boston wrote: Medication: traMADol (ULTRAM) 50 MG tablet 120 tablet 1 12/01/2020   Sig - Route: Take 1 tablet (50 mg total) by mouth every 4 (four) hours as needed (Each prescription to last 1 month). - Oral  Sent to pharmacy as: traMADol (ULTRAM) 50 MG tablet  Notes to Pharmacy: Not to exceed 5 additional fills before 06/14/2020  E-Prescribing Status: Receipt confirmed by pharmacy (12/01/2020 5:56 AM EDT)     Has the patient contacted their pharmacy? Yes.   (Agent: If no, request that the patient contact the pharmacy for the refill. If patient does not wish to contact the pharmacy document the reason why and proceed with request.) (Agent: If yes, when and what did the pharmacy advise?) Call dr  Preferred Pharmacy (with phone number or street name): [id number] - [place], [place] - [address] [place] [address] Phone: [phone] Fax: [phone] Hours: Not open 24 hours   Has the patient been seen for an appointment in the last year OR does the patient have an upcoming appointment? Yes.    Agent: Please be advised that RX refills may take up to 3 business days. We ask that you follow-up with your pharmacy.

## 2021-01-04 ENCOUNTER — Other Ambulatory Visit: Payer: Self-pay

## 2021-01-04 ENCOUNTER — Emergency Department
Admission: EM | Admit: 2021-01-04 | Discharge: 2021-01-04 | Disposition: A | Discharge status: Home / Self Care | Payer: Medicare Other | Attending: Emergency Medicine | Admitting: Emergency Medicine

## 2021-01-04 DIAGNOSIS — E119 Type 2 diabetes mellitus without complications: Secondary | ICD-10-CM | POA: Insufficient documentation

## 2021-01-04 DIAGNOSIS — Z7982 Long term (current) use of aspirin: Secondary | ICD-10-CM | POA: Insufficient documentation

## 2021-01-04 DIAGNOSIS — Z853 Personal history of malignant neoplasm of breast: Secondary | ICD-10-CM | POA: Insufficient documentation

## 2021-01-04 DIAGNOSIS — Z79899 Other long term (current) drug therapy: Secondary | ICD-10-CM | POA: Diagnosis not present

## 2021-01-04 DIAGNOSIS — I10 Essential (primary) hypertension: Secondary | ICD-10-CM | POA: Insufficient documentation

## 2021-01-04 DIAGNOSIS — Z7984 Long term (current) use of oral hypoglycemic drugs: Secondary | ICD-10-CM | POA: Diagnosis not present

## 2021-01-04 DIAGNOSIS — R059 Cough, unspecified: Secondary | ICD-10-CM | POA: Diagnosis present

## 2021-01-04 DIAGNOSIS — U071 COVID-19: Secondary | ICD-10-CM | POA: Insufficient documentation

## 2021-01-04 LAB — RESP PANEL BY RT-PCR (FLU A&B, COVID) ARPGX2
Influenza A by PCR: NEGATIVE
Influenza B by PCR: NEGATIVE
SARS Coronavirus 2 by RT PCR: POSITIVE — AB

## 2021-01-04 NOTE — ED Notes (Signed)
Orders discontinued due to EDP placing orders

## 2021-01-04 NOTE — ED Provider Notes (Signed)
[facility] Emergency Department Provider Note    ____________________________________________   Event Date/Time   First MD Initiated Contact with Patient 01/04/21 1729     (approximate)  I have reviewed the triage vital signs and the nursing notes.   HISTORY  Chief Complaint Cough   HPI Rachel Herman is a 70 y.o. female, history of diabetes and breast cancer, presents the emergency department for evaluation of cough and runny nose.  Patient states that her husband is currently being seen today for severe respiratory symptoms and he was diagnosed with COVID.  While here, patient states that she figured she might as well be evaluated in the emergency department as well and get tested.  Denies fever/chills, chest pain, SOB, abdominal pain, or urinary symptoms.  History limited by: No limitations  Past Medical History:  Diagnosis Date   Breast cancer ([place]) 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   Diabetes mellitus ([place]) 09/16/13   Diagnosed on 09/16/13; HgA1C was 7.2/metformin   Dizziness    GERD (gastroesophageal reflux disease)    Headache    Hyperlipidemia    Hypertension 2014   Low back pain    Obesity, morbid, BMI 40.0-49.9 (HCC)    PONV (postoperative nausea and vomiting)    S/P radiation therapy 04/05/14-05/20/14   left breast 60.4Gy totaldose    Patient Active Problem List   Diagnosis Date Noted   Pneumococcal vaccination declined 10/18/2019   Chronic cough 08/03/2018   Other intervertebral disc degeneration, lumbar region 03/18/2018   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/21/2016   Lumbar pain with radiation down left and right leg 07/02/2016   Thyroid nodule 06/09/2015   Nodule of right lung 07/28/2014   Diverticulosis of colon without hemorrhage 07/28/2014   DJD (degenerative joint disease), lumbar 02/22/2014   Lumbar pain with radiation down left leg 01/17/2014    Breast cancer of upper-inner quadrant of left female breast ([place]) 01/05/2014   Hyperlipidemia associated with type 2 diabetes mellitus ([place]) 09/28/2013   S/P total hysterectomy and bilateral salpingo-oophorectomy 09/20/2013   Essential hypertension    Diabetes mellitus ([place]) 09/16/2013    Past Surgical History:  Procedure Laterality Date   ABDOMINAL HYSTERECTOMY N/A 09/20/2013   Procedure: HYSTERECTOMY ABDOMINAL;  Surgeon: Osborne Oman, MD;  Location: [place] [facility];  Service: Gynecology;  Laterality: N/A;   BREAST LUMPECTOMY Left 2015   radiation   BREAST LUMPECTOMY WITH AXILLARY LYMPH NODE BIOPSY Left 12/29/13   left breast    CATARACT EXTRACTION Right    LAPAROSCOPIC ASSISTED VAGINAL HYSTERECTOMY  09/20/2013   Procedure: LAPAROSCOPIC ASSISTED VAGINAL HYSTERECTOMY;  Surgeon: Osborne Oman, MD;  Location: [place] [facility];  Service: Gynecology;;  PT WAS EXAMINED WHILE UP IN [place] AND IT WAS DECIDED TO OPEN PT DUE TO LARGE MASS   LUMBAR LAMINECTOMY/DECOMPRESSION MICRODISCECTOMY Right 12/14/2014   Procedure: Right Lumbar three-four microdiskectomy;  Surgeon: Newman Pies, MD;  Location: [place] [facility];  Service: Neurosurgery;  Laterality: Right;  Right Lumbar three-four microdiskectomy   RADIOACTIVE SEED GUIDED PARTIAL MASTECTOMY WITH AXILLARY SENTINEL LYMPH NODE BIOPSY Left 12/29/2013   Procedure: LEFT BREAST RADIOACTIVE SEED LOCALIZED LUMPECTOMY WITH SENTINEL LYMPH NODE MAPPING;  Surgeon: Erroll Luna, MD;  Location: [place];  Service: General;  Laterality: Left;   RE-EXCISION OF BREAST LUMPECTOMY Left 03/02/2014   Procedure: RE-EXCISION OF LEFT BREAST LUMPECTOMY;  Surgeon: Erroll Luna, MD;  Location: [place];  Service: General;  Laterality: Left;   SALPINGOOPHORECTOMY  09/20/2013   Procedure: SALPINGO OOPHORECTOMY;  Surgeon: Osborne Oman, MD;  Location: [facility];  Service: Gynecology;;    Prior to Admission medications   Medication Sig Start Date End Date  Taking? Authorizing Provider  albuterol (VENTOLIN HFA) 108 (90 Base) MCG/ACT inhaler Inhale 2 puffs into the lungs every 6 (six) hours as needed for wheezing or shortness of breath. 07/27/18   Ladell Pier, MD  anastrozole (ARIMIDEX) 1 MG tablet TAKE 1 TABLET BY MOUTH EVERY DAY 04/29/19   Truitt Merle, MD  Ascorbic Acid (VITAMIN C) 1000 MG tablet Take 1,000 mg by mouth daily.    [provider]  aspirin EC 325 MG tablet Take 325 mg by mouth daily.    [provider]  Blood Glucose Monitoring Suppl (ONE TOUCH ULTRA MINI) w/Device KIT USE AS DIRECTED ONCE DAILY DX CODE E11.9 11/10/17   Ladell Pier, MD  carvedilol (COREG) 6.25 MG tablet Take 1.5 tablets (9.375 mg total) by mouth 2 (two) times daily with a meal. 10/17/20   Ladell Pier, MD  cloNIDine (CATAPRES) 0.1 MG tablet TAKE 1 TABLET BY MOUTH 2 TIMES DAILY. 12/03/20   Ladell Pier, MD  [employer] SENNA PLUS 8.6-50 MG tablet TAKE 2 TABLETS BY MOUTH AT BEDTIME AS NEEDED FOR MILD CONSTIPATION Patient taking differently: Take 1 tablet by mouth at bedtime. 07/11/16   Funches, Adriana Mccallum, MD  DULoxetine (CYMBALTA) 20 MG capsule TAKE 1 CAPSULE BY MOUTH EVERY DAY 01/07/20   Truitt Merle, MD  gabapentin (NEURONTIN) 300 MG capsule TAKE 2 CAPSULES (600 MG) BY MOUTH 2 TIMES DAILY. 09/08/20   Ladell Pier, MD  hydrochlorothiazide (HYDRODIURIL) 25 MG tablet TAKE 1 TABLET BY MOUTH EVERYDAY AT BEDTIME 09/05/20   Ladell Pier, MD  metFORMIN (GLUCOPHAGE) 500 MG tablet TAKE 1 & 1/2 TABLETS (750 MG) BY MOUTH 2 TIMES DAILY. 01/01/21   Ladell Pier, MD  Multiple Vitamins-Minerals (MULTIVITAMIN WITH MINERALS) tablet Take 1 tablet by mouth daily. Reported on 03/13/2015    [provider]  omeprazole (PRILOSEC) 20 MG capsule Take 1 capsule (20 mg total) by mouth 2 (two) times daily before a meal. 06/16/20   Ladell Pier, MD  [facility] DELICA LANCETS 36P MISC USE AS DIRECTED ONCE DAILY DX CODE E11.9 11/10/17   Ladell Pier,  MD  [facility] ULTRA test strip TEST ONCE DAILY AS DIRECTED E11.9 02/12/20   Ladell Pier, MD  potassium chloride (KLOR-CON) 10 MEQ tablet TAKE 1 TABLET BY MOUTH EVERY DAY 11/09/19   Ladell Pier, MD  pravastatin (PRAVACHOL) 20 MG tablet TAKE 1 TABLET BY MOUTH EVERY DAY 10/10/20   Ladell Pier, MD  traMADol (ULTRAM) 50 MG tablet Take 1 tablet (50 mg total) by mouth every 4 (four) hours as needed (Each prescription to last 1 month). 12/01/20   Ladell Pier, MD    Allergies Patient has no known allergies.  Family History  Problem Relation Age of Onset   Diabetes Mother    Multiple myeloma Mother 23   Hearing loss Mother    Diabetes Brother    Cancer Brother 34       prostate cancer    Diabetes Maternal Aunt    Leukemia Maternal Aunt        dx in her 56s   Alcoholism Brother    Heart attack Father    Diabetes Sister    Diabetes Son     Social History Social  History   Tobacco Use   Smoking status: Never   Smokeless tobacco: Never  Vaping Use   Vaping Use: Never used  Substance Use Topics   Alcohol use: No   Drug use: No    Review of Systems  Constitutional: Negative for fever/chills, weight loss, or fatigue.  Eyes: Negative for visual changes or discharge.  ENT: Positive for cough and sinus congestion.  Gastrointestinal: Negative for abdominal pain, nausea/vomiting, or diarrhea.  Genitourinary: Negative for dysuria or hematuria.  Musculoskeletal: Negative for back pain or joint pain.  Skin: Negative for rashes or lesions.  Neurological: Negative for headache, syncope, dizziness, tremors, or numbness/tingling.   10-point ROS otherwise negative. ____________________________________________   PHYSICAL EXAM:  VITAL SIGNS: ED Triage Vitals  Enc Vitals Group     BP 01/04/21 1717 (!) 160/106     Pulse Rate 01/04/21 1717 (!) 102     Resp 01/04/21 1717 20     Temp 01/04/21 1717 98 F (36.7 C)     Temp Source 01/04/21 2115 Oral     SpO2 01/04/21  1717 94 %     Weight 01/04/21 1718 216 lb (98 kg)     Height 01/04/21 1718 '5\' 5"'  (1.651 m)     Head Circumference --      Peak Flow --      Pain Score 01/04/21 1718 0     Pain Loc --      Pain Edu? --      Excl. in [place]? --     Physical Exam Constitutional:      Appearance: Normal appearance.  HENT:     Head: Normocephalic and atraumatic.     Nose: Nose normal.     Mouth/Throat:     Mouth: Mucous membranes are moist.     Pharynx: Oropharynx is clear. No oropharyngeal exudate or posterior oropharyngeal erythema.  Eyes:     Extraocular Movements: Extraocular movements intact.     Pupils: Pupils are equal, round, and reactive to light.  Cardiovascular:     Rate and Rhythm: Normal rate and regular rhythm.  Pulmonary:     Effort: Pulmonary effort is normal. No respiratory distress.     Breath sounds: Normal breath sounds. No wheezing or rhonchi.  Abdominal:     General: Abdomen is flat.     Palpations: Abdomen is soft.  Musculoskeletal:        General: Normal range of motion.     Cervical back: Normal range of motion and neck supple.  Skin:    General: Skin is warm and dry.     Capillary Refill: Capillary refill takes less than 2 seconds.  Neurological:     Mental Status: She is alert and oriented to person, place, and time.  Psychiatric:        Mood and Affect: Mood normal.        Behavior: Behavior normal.        Thought Content: Thought content normal.        Judgment: Judgment normal.     ____________________________________________    LABS  (all labs ordered are listed, but only abnormal results are displayed)  Labs Reviewed  RESP PANEL BY RT-PCR (FLU A&B, COVID) [id number] - Abnormal; Notable for the following components:      Result Value   SARS Coronavirus 2 by RT PCR POSITIVE (*)    All other components within normal limits     ____________________________________________   EKG None.   ____________________________________________  RADIOLOGY I  personally viewed and evaluated these images as part of my medical decision making, as well as reviewing the written report by the radiologist.  ED Provider Interpretation: Not applicable.  No results found.  ____________________________________________   PROCEDURES  Procedures   Medications - No data to display  Critical Care performed: No  ____________________________________________   INITIAL IMPRESSION / ASSESSMENT AND PLAN / ED COURSE  Pertinent labs & imaging results that were available during my care of the patient were reviewed by me and considered in my medical decision making (see chart for details).       Rachel Herman is a 70 y.o. female, history of diabetes,  presents the emergency department for evaluation of cough and runny nose.  Patient states that her husband is currently being seen today for severe respiratory symptoms and he was diagnosed with COVID.  While here, patient states that she figured she might as well be evaluated in the emergency department as well and get tested.  Denies fever/chills, chest pain, SOB, abdominal pain, or urinary symptoms.  Upon presentation, patient appears well.  She is sitting upright in the bed, comfortably.  NAD.  Physical exam is overall unremarkable.  Lung sounds are clear bilaterally.  Throat is nonerythematous, no exudates present.  Her vital signs are within normal limits.  Respiratory panel shows positive for COVID-19.  Discussed the results with the patient.  Patient states that other than cough and runny nose, she otherwise feels fine.  Given the patient's history and physical exam, I do not believe any further work-up or treatment is indicated at this time in the emergency department.  We will plan to discharge the patient with follow-up, anticipatory guidance, and strict return precautions.      ____________________________________________   FINAL CLINICAL IMPRESSION(S) / ED DIAGNOSES  Final diagnoses:   COVID-19     NEW MEDICATIONS STARTED DURING THIS VISIT:  ED Discharge Orders     None        Note:  This document was prepared using Dragon voice recognition software and may include unintentional dictation errors.    Teodoro Spray, [place] 01/04/21 2250    Nance Pear, MD 01/06/21 [phone]

## 2021-01-04 NOTE — ED Provider Notes (Signed)
  Emergency Medicine Provider Triage Evaluation Note  Rachel Herman , a 70 y.o.female,  was evaluated in triage.  Pt complains of cough and runny nose for the past 2 days.  Denies fever.  Patient states that her husband is here for similar symptoms and states that she.  She might as well get checked out to.  Patient endorses some shortness of breath, but appears well.  She is not tachypneic or hypoxic. Denies chest pain, abdominal pain, back pain, or urinary symptoms   Review of Systems  Positive: Cough and runny nose Negative: Denies fever, chest pain, vomiting  Physical Exam   Vitals:   01/04/21 1717  BP: (!) 160/106  Pulse: (!) 102  Resp: 20  Temp: 98 F (36.7 C)  SpO2: 94%   Gen:   Awake, no distress   Resp:  Normal effort  MSK:   Moves extremities without difficulty  Other:    Medical Decision Making  Given the patient's initial medical screening exam, the following diagnostic evaluation has been ordered. The patient will be placed in the appropriate treatment space, once one is available, to complete the evaluation and treatment. I have discussed the plan of care with the patient and I have advised the patient that an ED physician or mid-level practitioner will reevaluate their condition after the test results have been received, as the results may give them additional insight into the type of treatment they may need.    Diagnostics: Respiratory panel  Treatments: none immediately   Teodoro Spray, PA 01/04/21 1729    Nance Pear, MD 01/04/21 Carollee Massed

## 2021-01-04 NOTE — Telephone Encounter (Signed)
Requested medications are due for refill today.  yes  Requested medications are on the active medications list.  yes  Last refill. 12/01/2020  Future visit scheduled.   yes  Notes to clinic.  Medication not delegated.

## 2021-01-04 NOTE — ED Triage Notes (Signed)
Pt states symptoms of cough, runny nose, but denies fever. Pt states hx of breast CA. Pt also c/o SOB. Pt states husband has similar symptoms and is already in the ER/hospital. Pt able to walk to triage, no SOB noted on exertion.

## 2021-01-05 ENCOUNTER — Other Ambulatory Visit: Payer: Self-pay | Admitting: Internal Medicine

## 2021-01-05 DIAGNOSIS — K219 Gastro-esophageal reflux disease without esophagitis: Secondary | ICD-10-CM

## 2021-01-05 DIAGNOSIS — E1169 Type 2 diabetes mellitus with other specified complication: Secondary | ICD-10-CM

## 2021-01-05 NOTE — Telephone Encounter (Signed)
Called pt unable to reach or leave VM .

## 2021-01-06 MED ORDER — TRAMADOL HCL 50 MG PO TABS: 50.0000 mg | ORAL_TABLET | ORAL | 1 refills | Status: DC | PRN

## 2021-01-13 ENCOUNTER — Other Ambulatory Visit: Payer: Self-pay | Admitting: Internal Medicine

## 2021-01-13 DIAGNOSIS — E876 Hypokalemia: Secondary | ICD-10-CM

## 2021-01-13 NOTE — Telephone Encounter (Signed)
Requested Prescriptions  Pending Prescriptions Disp Refills  . potassium chloride (KLOR-CON) 10 MEQ tablet [Pharmacy Med Name: POTASSIUM CL ER 10 MEQ TABLET] 90 tablet 1    Sig: TAKE 1 TABLET BY [place] DAY     Endocrinology:  Minerals - Potassium Supplementation Passed - 01/13/2021 12:50 AM      Passed - K in normal range and within 360 days    Potassium  Date Value Ref Range Status  07/13/2020 4.3 3.5 - 5.2 mmol/L Final  11/08/2016 3.0 (LL) 3.5 - 5.1 mEq/L Final         Passed - Cr in normal range and within 360 days    Creatinine  Date Value Ref Range Status  10/18/2019 97.4 20.0 - 300.0 mg/dL Final  11/08/2016 1.2 (H) 0.6 - 1.1 mg/dL Final   Creatinine, Ser  Date Value Ref Range Status  07/13/2020 0.84 0.57 - 1.00 mg/dL Final   Creatinine, POC  Date Value Ref Range Status  07/02/2016 100mg  mg/dL Final         Passed - Valid encounter within last 12 months    Recent Outpatient Visits          2 months ago Encounter for Medicare annual wellness exam   [place] Ladell Pier, MD   6 months ago Muscular cramp   [place], MD   7 months ago Type 2 diabetes mellitus with obesity [facility])   [place] Karle Plumber B, MD   11 months ago Type 2 diabetes mellitus with obesity [facility])   [place] Ladell Pier, MD   1 year ago Need for influenza vaccination   [place], RPH-CPP      Future Appointments            In 1 month Wynetta Emery, Dalbert Batman, MD [place]

## 2021-02-16 ENCOUNTER — Encounter: Payer: Self-pay | Admitting: Internal Medicine

## 2021-02-16 ENCOUNTER — Ambulatory Visit: Payer: Medicare Other | Attending: Internal Medicine | Admitting: Internal Medicine

## 2021-02-16 DIAGNOSIS — E1169 Type 2 diabetes mellitus with other specified complication: Secondary | ICD-10-CM

## 2021-02-16 DIAGNOSIS — Z6379 Other stressful life events affecting family and household: Secondary | ICD-10-CM | POA: Diagnosis not present

## 2021-02-16 DIAGNOSIS — I10 Essential (primary) hypertension: Secondary | ICD-10-CM | POA: Diagnosis not present

## 2021-02-16 DIAGNOSIS — E785 Hyperlipidemia, unspecified: Secondary | ICD-10-CM

## 2021-02-16 DIAGNOSIS — G8929 Other chronic pain: Secondary | ICD-10-CM | POA: Diagnosis not present

## 2021-02-16 DIAGNOSIS — M5416 Radiculopathy, lumbar region: Secondary | ICD-10-CM

## 2021-02-16 DIAGNOSIS — E669 Obesity, unspecified: Secondary | ICD-10-CM

## 2021-02-16 MED ORDER — CLONIDINE HCL 0.1 MG PO TABS: 0.1000 mg | ORAL_TABLET | Freq: Two times a day (BID) | ORAL | 2 refills | Status: DC

## 2021-02-16 MED ORDER — PRAVASTATIN SODIUM 20 MG PO TABS: 20.0000 mg | ORAL_TABLET | Freq: Every day | ORAL | 2 refills | Status: DC

## 2021-02-16 MED ORDER — METFORMIN HCL 500 MG PO TABS: ORAL_TABLET | ORAL | 2 refills | Status: DC

## 2021-02-16 NOTE — Progress Notes (Signed)
Patient ID: Rachel Herman, female   DOB: 02/22/1950, 71 y.o.   MRN: [id number] Virtual Visit via Telephone Note  I connected with Rachel Herman on 02/16/2021 at 9:42 AM by telephone and verified that I am speaking with the correct person using two identifiers  Location: Patient: home Provider: office  Participants: Myself Patient   I discussed the limitations, risks, security and privacy concerns of performing an evaluation and management service by telephone and the availability of in person appointments. I also discussed with the patient that there may be a patient responsible charge related to this service. The patient expressed understanding and agreed to proceed.   History of Present Illness: Pt with hx of HTN, DM, HL, DJD lumbar and spinal stenosis with hx of laminectomy 2016, obesity, depression, Ductal  CA LT s/p lumpectomy/XRT (2015 and 2016, followed by Dr. Annamaria Boots), vaginal prolapse, CKD 3, RT lung nodule (CTA 10/2018 - stable).  Spouse in hosp at [place] in [place] since 01/21/2021 with CVA.  She reports he is not doing too well.  She has been going to [facility] and sleeping there at the hospital 2 to 3 days at a time.  Reports good family support.  DM: checking BS QOD.  BS this a.m was 110.  Gives range 100-142. Compliant with Metformin 500 mg 1.5 BID Lately she skips meals when she is in [place].  They do have a cafeteria at the [place] but she states the food is too expensive.  HTN:  reports BP readings have been good.  Checks Q2-3 days. Limits salt No CP/SOB/dizziness/lower extremity edema Reports compliance with her medications including carvedilol, HCTZ and clonidine.  HL:  taking and tolerating Pravachol.  Last LDL 110.  She is overdue for recheck.  Chronic lower back pain and RT hip pain: Had an injection to the right hip by Dr. Ernestina Patches in October of last year.  Patient states the effect wore off by Thanksgiving.  She reports decrease in her pain with tramadol that allows her  to be functional.  She denies any adverse side effects from the tramadol.  She last filled her prescription 01/25/2021.  [place] controlled substance reporting system reviewed.  She has not had any aberrant behaviors.  Will be due for update of controlled substance prescribing agreement on her next in person visit. Outpatient Encounter Medications as of 02/16/2021  Medication Sig   albuterol (VENTOLIN HFA) 108 (90 Base) MCG/ACT inhaler Inhale 2 puffs into the lungs every 6 (six) hours as needed for wheezing or shortness of breath.   anastrozole (ARIMIDEX) 1 MG tablet TAKE 1 TABLET BY MOUTH EVERY DAY   Ascorbic Acid (VITAMIN C) 1000 MG tablet Take 1,000 mg by mouth daily.   aspirin EC 325 MG tablet Take 325 mg by mouth daily.   Blood Glucose Monitoring Suppl (ONE TOUCH ULTRA MINI) w/Device KIT USE AS DIRECTED ONCE DAILY DX CODE E11.9   carvedilol (COREG) 6.25 MG tablet Take 1.5 tablets (9.375 mg total) by mouth 2 (two) times daily with a meal.   cloNIDine (CATAPRES) 0.1 MG tablet TAKE 1 TABLET BY MOUTH 2 TIMES DAILY.   [employer] SENNA PLUS 8.6-50 MG tablet TAKE 2 TABLETS BY MOUTH AT BEDTIME AS NEEDED FOR MILD CONSTIPATION (Patient taking differently: Take 1 tablet by mouth at bedtime.)   DULoxetine (CYMBALTA) 20 MG capsule TAKE 1 CAPSULE BY MOUTH EVERY DAY   gabapentin (NEURONTIN) 300 MG capsule TAKE 2 CAPSULES (600 MG) BY MOUTH 2 TIMES DAILY.   hydrochlorothiazide (HYDRODIURIL)  25 MG tablet TAKE 1 TABLET BY MOUTH EVERYDAY AT BEDTIME   metFORMIN (GLUCOPHAGE) 500 MG tablet TAKE 1 & 1/2 TABLETS (750 MG) BY MOUTH 2 TIMES DAILY.   Multiple Vitamins-Minerals (MULTIVITAMIN WITH MINERALS) tablet Take 1 tablet by mouth daily. Reported on 03/13/2015   omeprazole (PRILOSEC) 20 MG capsule TAKE 1 CAPSULE (20 MG TOTAL) BY MOUTH 2 (TWO) TIMES DAILY BEFORE A MEAL.   ONETOUCH DELICA LANCETS 40J MISC USE AS DIRECTED ONCE DAILY DX CODE E11.9   ONETOUCH ULTRA test strip TEST ONCE DAILY AS DIRECTED E11.9   potassium  chloride (KLOR-CON) 10 MEQ tablet TAKE 1 TABLET BY MOUTH EVERY DAY   pravastatin (PRAVACHOL) 20 MG tablet TAKE 1 TABLET BY MOUTH EVERY DAY   traMADol (ULTRAM) 50 MG tablet Take 1 tablet (50 mg total) by mouth every 4 (four) hours as needed (Each prescription to last 1 month).   No facility-administered encounter medications on file as of 02/16/2021.      Observations/Objective: Lab Results  Component Value Date   WBC 7.6 04/12/2020   HGB 12.1 04/12/2020   HCT 39.0 04/12/2020   MCV 88.4 04/12/2020   PLT 253 04/12/2020     Chemistry      Component Value Date/Time   NA 142 07/13/2020 0916   NA 143 11/08/2016 0915   K 4.3 07/13/2020 0916   K 3.0 (LL) 11/08/2016 0915   CL 100 07/13/2020 0916   CO2 27 07/13/2020 0916   CO2 30 (H) 11/08/2016 0915   BUN 25 07/13/2020 0916   BUN 18.9 11/08/2016 0915   CREATININE 0.84 07/13/2020 0916   CREATININE 1.2 (H) 11/08/2016 0915      Component Value Date/Time   CALCIUM 9.7 07/13/2020 0916   CALCIUM 10.2 11/08/2016 0915   ALKPHOS 48 04/12/2020 1012   ALKPHOS 68 11/08/2016 0915   AST 26 04/12/2020 1012   AST 24 11/08/2016 0915   ALT 31 04/12/2020 1012   ALT 23 11/08/2016 0915   BILITOT 0.4 04/12/2020 1012   BILITOT 0.57 11/08/2016 0915     Lab Results  Component Value Date   CHOL 191 10/18/2019   HDL 44 10/18/2019   LDLCALC 110 (H) 10/18/2019   TRIG 215 (H) 10/18/2019   CHOLHDL 4.3 10/18/2019    Assessment and Plan: 1. Type 2 diabetes mellitus with obesity ([place]) Reported blood sugar readings are at goal.  She will continue metformin.  Recommend that she try to pack her lunch and healthy snacks like fruits and [place] and take it with her when she has to go to [facility] so that she is not skipping meals. - Microalbumin / creatinine urine ratio; Future - Hemoglobin A1c; Future  2. Essential hypertension Patient does not recall her blood pressure readings or has a reading to give me at this time but she reports that her numbers have  been good.  I reminded her that our goal is 130/80 or lower.  She will continue to monitor blood pressure.  She will continue current blood pressure medications as listed above.  3. Hyperlipidemia associated with type 2 diabetes mellitus (HCC) Continue pravastatin.  We will check lipid profile.  4. Chronic radicular pain of lower back Patient reports doing well on tramadol.  No adverse side effects.  She has not had any aberrant behaviors.  We will plan to update controlled substance prescribing agreement on next in person visit.  5. Stressful life event affecting family Patient reports good support from her sons.   Follow Up  Instructions: 4 mths   I discussed the assessment and treatment plan with the patient. The patient was provided an opportunity to ask questions and all were answered. The patient agreed with the plan and demonstrated an understanding of the instructions.   The patient was advised to call back or seek an in-person evaluation if the symptoms worsen or if the condition fails to improve as anticipated.  I  Spent 10 minutes on this telephone encounter  Karle Plumber, MD

## 2021-02-18 ENCOUNTER — Other Ambulatory Visit: Payer: Self-pay | Admitting: Internal Medicine

## 2021-02-18 DIAGNOSIS — E119 Type 2 diabetes mellitus without complications: Secondary | ICD-10-CM

## 2021-02-18 NOTE — Telephone Encounter (Signed)
Requested Prescriptions  Pending Prescriptions Disp Refills   ONETOUCH ULTRA test strip [Pharmacy Med Name: [place] TEST STRP] 100 strip 12    Sig: TEST ONCE DAILY AS DIRECTED E11.9     Endocrinology: Diabetes - Testing Supplies Passed - 02/18/2021  3:29 PM      Passed - Valid encounter within last 12 months    Recent Outpatient Visits          2 days ago Type 2 diabetes mellitus with obesity ([place])   [place] Ladell Pier, MD   4 months ago Encounter for [employer] annual wellness exam   [place] Ladell Pier, MD   7 months ago Muscular cramp   [place] Karle Plumber B, MD   8 months ago Type 2 diabetes mellitus with obesity [facility])   [place] [facility] Ladell Pier, MD   1 year ago Type 2 diabetes mellitus with obesity [facility])   [place] [facility] Ladell Pier, MD

## 2021-02-19 ENCOUNTER — Telehealth: Payer: Self-pay

## 2021-02-19 NOTE — Telephone Encounter (Signed)
-----   Message from Ladell Pier, MD sent at 02/16/2021  9:56 AM EST ----- Give follow-up appointment in 4 months with me.

## 2021-02-19 NOTE — Telephone Encounter (Signed)
Pt has been scheduled and reminder has been mailed.  

## 2021-02-21 ENCOUNTER — Other Ambulatory Visit: Payer: Self-pay

## 2021-02-21 ENCOUNTER — Ambulatory Visit: Payer: Medicare Other | Attending: Internal Medicine

## 2021-02-21 DIAGNOSIS — E785 Hyperlipidemia, unspecified: Secondary | ICD-10-CM | POA: Diagnosis not present

## 2021-02-21 DIAGNOSIS — E1169 Type 2 diabetes mellitus with other specified complication: Secondary | ICD-10-CM | POA: Diagnosis not present

## 2021-02-21 DIAGNOSIS — E669 Obesity, unspecified: Secondary | ICD-10-CM

## 2021-02-22 ENCOUNTER — Other Ambulatory Visit: Payer: Self-pay | Admitting: Internal Medicine

## 2021-02-22 LAB — HEPATIC FUNCTION PANEL
ALT: 21 IU/L (ref 0–32)
AST: 25 IU/L (ref 0–40)
Albumin: 4.1 g/dL (ref 3.7–4.7)
Alkaline Phosphatase: 53 IU/L (ref 44–121)
Bilirubin Total: 0.3 mg/dL (ref 0.0–1.2)
Bilirubin, Direct: <0.1 mg/dL (ref 0.00–0.40)
Total Protein: 6.9 g/dL (ref 6.0–8.5)

## 2021-02-22 LAB — CBC
Hematocrit: 41 % (ref 34.0–46.6)
Hemoglobin: 13.4 g/dL (ref 11.1–15.9)
MCH: 28.6 pg (ref 26.6–33.0)
MCHC: 32.7 g/dL (ref 31.5–35.7)
MCV: 88 fL (ref 79–97)
Platelets: 287 10*3/uL (ref 150–450)
RBC: 4.68 x10E6/uL (ref 3.77–5.28)
RDW: 13.7 % (ref 11.7–15.4)
WBC: 8.8 10*3/uL (ref 3.4–10.8)

## 2021-02-22 LAB — LIPID PANEL
Chol/HDL Ratio: 5.2 ratio — ABNORMAL HIGH (ref 0.0–4.4)
Cholesterol, Total: 198 mg/dL (ref 100–199)
HDL: 38 mg/dL — ABNORMAL LOW (ref >39)
LDL Chol Calc (NIH): 101 mg/dL — ABNORMAL HIGH (ref 0–99)
Triglycerides: 349 mg/dL — ABNORMAL HIGH (ref 0–149)
VLDL Cholesterol Cal: 59 mg/dL — ABNORMAL HIGH (ref 5–40)

## 2021-02-22 LAB — HEMOGLOBIN A1C
Est. average glucose Bld gHb Est-mCnc: 126 mg/dL
Hgb A1c MFr Bld: 6 % — ABNORMAL HIGH (ref 4.8–5.6)

## 2021-02-22 LAB — MICROALBUMIN / CREATININE URINE RATIO
Creatinine, Urine: 105.2 mg/dL
Microalb/Creat Ratio: <3 mg/g creat (ref 0–29)
Microalbumin, Urine: <3 ug/mL

## 2021-02-22 MED ORDER — ATORVASTATIN CALCIUM 20 MG PO TABS: 20.0000 mg | ORAL_TABLET | Freq: Every day | ORAL | 3 refills | Status: DC

## 2021-02-22 NOTE — Progress Notes (Signed)
Let patient know that her cholesterol and triglyceride levels are elevated.  I recommend changing the Pravachol to a different cholesterol medicine called atorvastatin for better cholesterol lowering effect.  I will send this prescription to her pharmacy.  Stop the Pravachol.  A1c is 6 which means her diabetes is under good control.  Cholesterol levels are good.  Blood cell counts normal.

## 2021-02-23 ENCOUNTER — Other Ambulatory Visit: Payer: Self-pay | Admitting: Internal Medicine

## 2021-02-23 ENCOUNTER — Telehealth: Payer: Self-pay

## 2021-02-23 DIAGNOSIS — G8929 Other chronic pain: Secondary | ICD-10-CM

## 2021-02-23 NOTE — Telephone Encounter (Signed)
Contacted pt to go over lab results pt is aware and doesn't have any questions or concerns   Pt is requesting a refill on Tramadol

## 2021-02-26 NOTE — Telephone Encounter (Signed)
Contacted pt and made aware  

## 2021-03-05 ENCOUNTER — Other Ambulatory Visit: Payer: Self-pay | Admitting: Internal Medicine

## 2021-03-05 DIAGNOSIS — I10 Essential (primary) hypertension: Secondary | ICD-10-CM

## 2021-03-05 NOTE — Telephone Encounter (Signed)
Requested Prescriptions  Pending Prescriptions Disp Refills   hydrochlorothiazide (HYDRODIURIL) 25 MG tablet [Pharmacy Med Name: HYDROCHLOROTHIAZIDE 25 MG TAB] 90 tablet 1    Sig: TAKE 1 TABLET BY MOUTH EVERYDAY AT BEDTIME     Cardiovascular: Diuretics - Thiazide Failed - 03/05/2021  1:49 AM      Failed - Last BP in normal range    BP Readings from Last 1 Encounters:  01/04/21 (!) 157/87         Passed - Ca in normal range and within 360 days    Calcium  Date Value Ref Range Status  07/13/2020 9.7 8.7 - 10.3 mg/dL Final  11/08/2016 10.2 8.4 - 10.4 mg/dL Final         Passed - Cr in normal range and within 360 days    Creatinine  Date Value Ref Range Status  10/18/2019 97.4 20.0 - 300.0 mg/dL Final  11/08/2016 1.2 (H) 0.6 - 1.1 mg/dL Final   Creatinine, Ser  Date Value Ref Range Status  07/13/2020 0.84 0.57 - 1.00 mg/dL Final   Creatinine, POC  Date Value Ref Range Status  07/02/2016 100mg  mg/dL Final         Passed - K in normal range and within 360 days    Potassium  Date Value Ref Range Status  07/13/2020 4.3 3.5 - 5.2 mmol/L Final  11/08/2016 3.0 (LL) 3.5 - 5.1 mEq/L Final         Passed - Na in normal range and within 360 days    Sodium  Date Value Ref Range Status  07/13/2020 142 134 - 144 mmol/L Final  11/08/2016 143 136 - 145 mEq/L Final         Passed - Valid encounter within last 6 months    Recent Outpatient Visits          2 weeks ago Type 2 diabetes mellitus with obesity ([place])   [place] Ladell Pier, MD   4 months ago Encounter for [employer] annual wellness exam   [place], Deborah B, MD   7 months ago Muscular cramp   [place], Deborah B, MD   8 months ago Type 2 diabetes mellitus with obesity [facility])   [place], Deborah B, MD   1 year ago Type 2 diabetes mellitus with obesity [facility])    [place], MD      Future Appointments            In 3 months Wynetta Emery, Dalbert Batman, MD [place]

## 2021-03-12 ENCOUNTER — Other Ambulatory Visit: Payer: Self-pay | Admitting: Hematology

## 2021-03-12 DIAGNOSIS — Z1231 Encounter for screening mammogram for malignant neoplasm of breast: Secondary | ICD-10-CM

## 2021-03-29 ENCOUNTER — Ambulatory Visit
Admission: RE | Admit: 2021-03-29 | Discharge: 2021-03-29 | Disposition: A | Discharge status: Home / Self Care | Payer: Medicare Other | Source: Ambulatory Visit | Attending: Hematology | Admitting: Hematology

## 2021-03-29 DIAGNOSIS — Z1231 Encounter for screening mammogram for malignant neoplasm of breast: Secondary | ICD-10-CM

## 2021-04-01 ENCOUNTER — Other Ambulatory Visit: Payer: Self-pay | Admitting: Internal Medicine

## 2021-04-01 DIAGNOSIS — K219 Gastro-esophageal reflux disease without esophagitis: Secondary | ICD-10-CM

## 2021-04-02 NOTE — Telephone Encounter (Signed)
Requested Prescriptions  Pending Prescriptions Disp Refills   omeprazole (PRILOSEC) 20 MG capsule [Pharmacy Med Name: OMEPRAZOLE DR 20 MG CAPSULE] 180 capsule 0    Sig: TAKE 1 CAPSULE (20 MG TOTAL) BY MOUTH 2 (TWO) TIMES DAILY BEFORE A MEAL.     Gastroenterology: Proton Pump Inhibitors Passed - 04/01/2021  1:11 AM      Passed - Valid encounter within last 12 months    Recent Outpatient Visits          1 month ago Type 2 diabetes mellitus with obesity ([place])   [place] Ladell Pier, MD   5 months ago Encounter for [employer] annual wellness exam   [place] Ladell Pier, MD   8 months ago Muscular cramp   [place], MD   9 months ago Type 2 diabetes mellitus with obesity [facility])   [place], Deborah B, MD   1 year ago Type 2 diabetes mellitus with obesity [facility])   [place], MD      Future Appointments            In 2 months Wynetta Emery Dalbert Batman, MD [place]

## 2021-04-09 NOTE — Progress Notes (Signed)
Rachel [facility]   [phone] [phone]   Clinic Follow up Note   Patient Care Team: Ladell Pier, MD as PCP - General (Internal Medicine) Boykin Nearing, MD as Consulting Physician (Family Medicine) Erroll Luna, MD as Consulting Physician (General Surgery) Truitt Merle, MD as Consulting Physician (Hematology) Kyung Rudd, MD as Consulting Physician (Radiation Oncology) 04/10/2021  CHIEF COMPLAINT: Follow-up left breast cancer  SUMMARY OF ONCOLOGIC HISTORY: Oncology History Overview Note  Malignant neoplasm of upper inner quadrant of female breast   Staging form: Breast, AJCC 7th Edition     Clinical: Stage IA (T1c, N0, M0) - Unsigned     Pathologic: No stage assigned - Unsigned     Breast cancer of upper-inner quadrant of left female breast ([place])  11/23/2013 Imaging   Ultrasound shows angulated hypoechoic mass at the left breast 10 o'clock 18 cm from nipple measuring 1.82 x 0.71 x 0.88 cm. Ultrasound of the left axilla is negative.      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    12/29/2013 Surgery   Left lumpectomy with close (<0.1cm) inferior margin   03/02/2014 Surgery   Reexcision for close margin, path negative for malignancy.   04/05/2014 - 05/20/2014 Radiation Therapy   adjuvant breast radiation    05/27/2014 Imaging   Bone density scan: normal    06/07/2014 - 03/2020 Anti-estrogen oral therapy   Exemestane 71m daily, started on 06/07/2014. She stopped in Nov 2018 due to high copay. Changed to anastrozole on 05/09/2017. Completed in 03/2020.    06/20/2015 Imaging   Bone scan 06/20/2015 IMPRESSION: No evidence of metastatic disease. Mild increased activity dorsal aspect right midfoot probable degenerative in nature. Clinical correlation is necessary.   02/29/2016 Mammogram   MM DIAG BREAST TOMO BIALTERAL 02/29/16 IMPRESSION: Stable left breast  lumpectomy site. No mammographic evidence of malignancy in the bilateral breasts.   03/03/2017 Mammogram   IMPRESSION: 1. No mammographic evidence of malignancy in either breast. 2. Stable left breast posttreatment changes.     CURRENT THERAPY: Surveillance  INTERVAL HISTORY: Ms. WStreiffreturns for follow-up as scheduled, last seen by Dr. FBurr Medico3/10/2020. She is grieving the recent loss of her husband in January and right after that her daughter in law was diagnosed with stage IV lung cancer, managed here. She is managing her grief except not eating well. For a year she has occasional pain in the left breast with turning/positioning. This has not changed. Denies new lump/mass, nipple discharge, inversion, or skin change.  Denies new bone/joint pain, extreme fatigue, abdominal pain/bloating, change in bowel habits, or any other new complaints.   MEDICAL HISTORY:  Past Medical History:  Diagnosis Date   Breast cancer ([place] 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   Diabetes mellitus ([place] 09/16/13   Diagnosed on 09/16/13; HgA1C was 7.2/metformin   Dizziness    GERD (gastroesophageal reflux disease)    Headache    Hyperlipidemia    Hypertension 2014   Low back pain    Obesity, morbid, BMI 40.0-49.9 (HCC)    PONV (postoperative nausea and vomiting)    S/P radiation therapy 04/05/14-05/20/14   left breast 60.4Gy totaldose    SURGICAL HISTORY: Past Surgical History:  Procedure Laterality Date   ABDOMINAL HYSTERECTOMY N/A 09/20/2013   Procedure: HYSTERECTOMY ABDOMINAL;  Surgeon: UOsborne Oman MD;  [facility];  Service: Gynecology;  Laterality: N/A;   BREAST LUMPECTOMY Left 2015  radiation   BREAST LUMPECTOMY WITH AXILLARY LYMPH NODE BIOPSY Left 12/29/13   left breast    CATARACT EXTRACTION Right    LAPAROSCOPIC ASSISTED VAGINAL HYSTERECTOMY  09/20/2013   Procedure: LAPAROSCOPIC ASSISTED VAGINAL HYSTERECTOMY;  Surgeon: Osborne Oman, MD;  Location: [facility];   Service: Gynecology;;  PT WAS EXAMINED WHILE UP IN [place] AND IT WAS DECIDED TO OPEN PT DUE TO LARGE MASS   LUMBAR LAMINECTOMY/DECOMPRESSION MICRODISCECTOMY Right 12/14/2014   Procedure: Right Lumbar three-four microdiskectomy;  Surgeon: Newman Pies, MD;  Location: [place] [facility];  Service: Neurosurgery;  Laterality: Right;  Right Lumbar three-four microdiskectomy   RADIOACTIVE SEED GUIDED PARTIAL MASTECTOMY WITH AXILLARY SENTINEL LYMPH NODE BIOPSY Left 12/29/2013   Procedure: LEFT BREAST RADIOACTIVE SEED LOCALIZED LUMPECTOMY WITH SENTINEL LYMPH NODE MAPPING;  Surgeon: Erroll Luna, MD;  Location: [facility];  Service: General;  Laterality: Left;   RE-EXCISION OF BREAST LUMPECTOMY Left 03/02/2014   Procedure: RE-EXCISION OF LEFT BREAST LUMPECTOMY;  Surgeon: Erroll Luna, MD;  Location: [facility];  Service: General;  Laterality: Left;   SALPINGOOPHORECTOMY  09/20/2013   Procedure: SALPINGO OOPHORECTOMY;  Surgeon: Osborne Oman, MD;  Location: [facility];  Service: Gynecology;;    I have reviewed the social history and family history with the patient and they are unchanged from previous note.  ALLERGIES:  has No Known Allergies.  MEDICATIONS:  Current Outpatient Medications  Medication Sig Dispense Refill   albuterol (VENTOLIN HFA) 108 (90 Base) MCG/ACT inhaler Inhale 2 puffs into the lungs every 6 (six) hours as needed for wheezing or shortness of breath. 8 g 3   Ascorbic Acid (VITAMIN C) 1000 MG tablet Take 1,000 mg by mouth daily.     aspirin EC 325 MG tablet Take 325 mg by mouth daily.     atorvastatin (LIPITOR) 20 MG tablet Take 1 tablet (20 mg total) by mouth daily. STOP PRAVASTATIN 90 tablet 3   Blood Glucose Monitoring Suppl (ONE TOUCH ULTRA MINI) w/Device KIT USE AS DIRECTED ONCE DAILY DX CODE E11.9 1 each 0   carvedilol (COREG) 6.25 MG tablet Take 1.5 tablets (9.375 mg total) by mouth 2 (two) times daily with a meal. 270 tablet 2   cloNIDine  (CATAPRES) 0.1 MG tablet Take 1 tablet (0.1 mg total) by mouth 2 (two) times daily. 180 tablet 2   [facility] SENNA PLUS 8.6-50 MG tablet TAKE 2 TABLETS BY MOUTH AT BEDTIME AS NEEDED FOR MILD CONSTIPATION (Patient taking differently: Take 1 tablet by mouth at bedtime.) 60 tablet 11   DULoxetine (CYMBALTA) 20 MG capsule TAKE 1 CAPSULE BY MOUTH EVERY DAY 90 capsule 1   gabapentin (NEURONTIN) 300 MG capsule TAKE 2 CAPSULES (600 MG) BY MOUTH 2 TIMES DAILY. 360 capsule 3   hydrochlorothiazide (HYDRODIURIL) 25 MG tablet TAKE 1 TABLET BY MOUTH EVERYDAY AT BEDTIME 90 tablet 0   metFORMIN (GLUCOPHAGE) 500 MG tablet TAKE 1 & 1/2 TABLETS (750 MG) BY MOUTH 2 TIMES DAILY. 270 tablet 2   Multiple Vitamins-Minerals (MULTIVITAMIN WITH MINERALS) tablet Take 1 tablet by mouth daily. Reported on 03/13/2015     omeprazole (PRILOSEC) 20 MG capsule TAKE 1 CAPSULE (20 MG TOTAL) BY MOUTH 2 (TWO) TIMES DAILY BEFORE A MEAL. 180 capsule 0   ONETOUCH DELICA LANCETS 48N MISC USE AS DIRECTED ONCE DAILY DX CODE E11.9 100 each 12   ONETOUCH ULTRA test strip TEST ONCE DAILY AS DIRECTED E11.9 100 strip 12   potassium chloride (KLOR-CON) 10 MEQ tablet TAKE 1  TABLET BY MOUTH EVERY DAY 90 tablet 1   traMADol (ULTRAM) 50 MG tablet Take 1 tablet (50 mg total) by mouth every 4 (four) hours as needed (Each prescription to last 1 month). 120 tablet 1   No current facility-administered medications for this visit.    PHYSICAL EXAMINATION:  Vitals:   04/10/21 1039  BP: 134/74  Pulse: 71  Resp: 17  Temp: 98.2 F (36.8 C)  SpO2: 97%   Filed Weights   04/10/21 1039  Weight: 212 lb 12.8 oz (96.5 kg)    GENERAL:alert, no distress and comfortable SKIN: No rash EYES: sclera clear NECK: Without mass LYMPH:  no palpable cervical or supraclavicular lymphadenopathy  LUNGS: normal breathing effort HEART: no lower extremity edema ABDOMEN:abdomen soft, non-tender and normal bowel sounds Musculoskeletal: No focal spinal tenderness NEURO: alert  & oriented x 3 with fluent speech, no focal motor deficits Breast exam: Breasts are symmetrical without nipple discharge or inversion.  S/p left lumpectomy, incisions completely healed.  No palpable mass or nodularity in either breast or axilla that I could appreciate.  LABORATORY DATA:  I have reviewed the data as listed CBC Latest Ref Rng & Units 04/10/2021 02/21/2021 04/12/2020  WBC 4.0 - 10.5 K/uL 8.1 8.8 7.6  Hemoglobin 12.0 - 15.0 g/dL 11.8(L) 13.4 12.1  Hematocrit 36.0 - 46.0 % 37.8 41.0 39.0  Platelets 150 - 400 K/uL 279 287 253     CMP Latest Ref Rng & Units 04/10/2021 02/21/2021 07/13/2020  Glucose 70 - 99 mg/dL 139(H) - 122(H)  BUN 8 - 23 mg/dL 21 - 25  Creatinine 0.44 - 1.00 mg/dL 0.94 - 0.84  Sodium 135 - 145 mmol/L 143 - 142  Potassium 3.5 - 5.1 mmol/L 4.1 - 4.3  Chloride 98 - 111 mmol/L 105 - 100  CO2 22 - 32 mmol/L 31 - 27  Calcium 8.9 - 10.3 mg/dL 9.1 - 9.7  Total Protein 6.5 - 8.1 g/dL 6.9 6.9 -  Total Bilirubin 0.3 - 1.2 mg/dL 0.4 0.3 -  Alkaline Phos 38 - 126 U/L 47 53 -  AST 15 - 41 U/L 20 25 -  ALT 0 - 44 U/L 22 21 -      RADIOGRAPHIC STUDIES: I have personally reviewed the radiological images as listed and agreed with the findings in the report. No results found.   ASSESSMENT & PLAN: 71 year old female   1. 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 Grade 2, Ki-67 73%. -Diagnosed in 12/2013. S/p lumpectomy 12/29/2013 and adjuvant radiation 04/05/2014 - 05/20/2014.  -Oncotype DX recurrence score is 23, with the predicted 10 year recurrence risk of 15% with tamoxifen alone. The benefit of chemotherapy is uncertain in this intermediate risk group. Adjuvant chemo was not recommended -She completed adjuvant antiestrogen therapy with Exemestane in 06/2014, but stopped in 12/2016 due to high copay. She went on to complete anastrozole from 05/2017 to early 2022. -Ms. Kinchen is  clinically doing well.  Breast exam is benign, labs show Hgb 11.8 and BG 139, otherwise normal.  Mammogram 2/23 is negative. -Overall there is no clinical concern for breast cancer recurrence, continue surveillance. -She prefers to continue follow-up in our clinic, I will see her back in 1 year   2. Bone health -DEXA 04/01/2019 showed lowest T score -0.5, normal  -Continue calcium, vitamin D, and weightbearing exercise  -Repeat in 2024   3. HTN, DM, HL, obesity, low pack pain -continue  f/u with PCP  -I encouraged her to continue healthy active lifestyle and stay up-to-date on age-appropriate health maintenance  Plan: -Labs, mammogram reviewed, no clinical concern for recurrence -Continue breast cancer surveillance -Screening mammogram and DEXA 03/2022 -Follow-up with me in 1 year, or sooner if needed   Orders Placed This Encounter  Procedures   MM 3D SCREEN BREAST BILATERAL    Standing Status:   Future    Standing Expiration Date:   04/11/2022    Order Specific Question:   Reason for Exam (SYMPTOM  OR DIAGNOSIS REQUIRED)    Answer:   h/o L breast cancer 2015    Order Specific Question:   Preferred imaging location?    Answer:   [facility]   DG Bone Density    Standing Status:   Future    Standing Expiration Date:   04/10/2022    Order Specific Question:   Reason for Exam (SYMPTOM  OR DIAGNOSIS REQUIRED)    Answer:   estrogen deficiency    Order Specific Question:   Preferred imaging location?    Answer:   [facility]   All questions were answered. The patient knows to call the clinic with any problems, questions or concerns. No barriers to learning were detected.     Rachel Feeling, NP 04/10/21

## 2021-04-10 ENCOUNTER — Inpatient Hospital Stay: Payer: Medicare Other | Attending: Hematology

## 2021-04-10 ENCOUNTER — Other Ambulatory Visit: Payer: Self-pay

## 2021-04-10 ENCOUNTER — Inpatient Hospital Stay: Payer: Medicare Other | Admitting: Nurse Practitioner

## 2021-04-10 ENCOUNTER — Encounter: Payer: Self-pay | Admitting: Nurse Practitioner

## 2021-04-10 VITALS — BP 134/74 | HR 71 | Temp 98.2°F | Resp 17 | Wt 212.8 lb

## 2021-04-10 DIAGNOSIS — E119 Type 2 diabetes mellitus without complications: Secondary | ICD-10-CM | POA: Insufficient documentation

## 2021-04-10 DIAGNOSIS — Z90721 Acquired absence of ovaries, unilateral: Secondary | ICD-10-CM | POA: Insufficient documentation

## 2021-04-10 DIAGNOSIS — Z853 Personal history of malignant neoplasm of breast: Secondary | ICD-10-CM | POA: Diagnosis not present

## 2021-04-10 DIAGNOSIS — Z923 Personal history of irradiation: Secondary | ICD-10-CM | POA: Diagnosis not present

## 2021-04-10 DIAGNOSIS — Z170 Estrogen receptor positive status [ER+]: Secondary | ICD-10-CM

## 2021-04-10 DIAGNOSIS — Z79899 Other long term (current) drug therapy: Secondary | ICD-10-CM | POA: Insufficient documentation

## 2021-04-10 DIAGNOSIS — C50212 Malignant neoplasm of upper-inner quadrant of left female breast: Secondary | ICD-10-CM

## 2021-04-10 DIAGNOSIS — E2839 Other primary ovarian failure: Secondary | ICD-10-CM | POA: Diagnosis not present

## 2021-04-10 LAB — COMPREHENSIVE METABOLIC PANEL
ALT: 22 U/L (ref 0–44)
AST: 20 U/L (ref 15–41)
Albumin: 3.8 g/dL (ref 3.5–5.0)
Alkaline Phosphatase: 47 U/L (ref 38–126)
Anion gap: 7 (ref 5–15)
BUN: 21 mg/dL (ref 8–23)
CO2: 31 mmol/L (ref 22–32)
Calcium: 9.1 mg/dL (ref 8.9–10.3)
Chloride: 105 mmol/L (ref 98–111)
Creatinine, Ser: 0.94 mg/dL (ref 0.44–1.00)
GFR, Estimated: >60 mL/min (ref >60)
Glucose, Bld: 139 mg/dL — ABNORMAL HIGH (ref 70–99)
Potassium: 4.1 mmol/L (ref 3.5–5.1)
Sodium: 143 mmol/L (ref 135–145)
Total Bilirubin: 0.4 mg/dL (ref 0.3–1.2)
Total Protein: 6.9 g/dL (ref 6.5–8.1)

## 2021-04-10 LAB — CBC WITH DIFFERENTIAL/PLATELET
Abs Immature Granulocytes: 0.01 10*3/uL (ref 0.00–0.07)
Basophils Absolute: 0.1 10*3/uL (ref 0.0–0.1)
Basophils Relative: 1 %
Eosinophils Absolute: 0.2 10*3/uL (ref 0.0–0.5)
Eosinophils Relative: 3 %
HCT: 37.8 % (ref 36.0–46.0)
Hemoglobin: 11.8 g/dL — ABNORMAL LOW (ref 12.0–15.0)
Immature Granulocytes: 0 %
Lymphocytes Relative: 23 %
Lymphs Abs: 1.9 10*3/uL (ref 0.7–4.0)
MCH: 27.3 pg (ref 26.0–34.0)
MCHC: 31.2 g/dL (ref 30.0–36.0)
MCV: 87.3 fL (ref 80.0–100.0)
Monocytes Absolute: 0.4 10*3/uL (ref 0.1–1.0)
Monocytes Relative: 5 %
Neutro Abs: 5.5 10*3/uL (ref 1.7–7.7)
Neutrophils Relative %: 68 %
Platelets: 279 10*3/uL (ref 150–400)
RBC: 4.33 MIL/uL (ref 3.87–5.11)
RDW: 14.3 % (ref 11.5–15.5)
WBC: 8.1 10*3/uL (ref 4.0–10.5)
nRBC: 0 % (ref 0.0–0.2)

## 2021-04-11 ENCOUNTER — Telehealth: Payer: Self-pay | Admitting: Hematology

## 2021-04-11 NOTE — Telephone Encounter (Signed)
Scheduled follow-up appointment per 3/7 los. Patient is aware. ?

## 2021-04-12 ENCOUNTER — Other Ambulatory Visit: Payer: Self-pay | Admitting: Internal Medicine

## 2021-04-12 DIAGNOSIS — G8929 Other chronic pain: Secondary | ICD-10-CM

## 2021-04-12 NOTE — Telephone Encounter (Signed)
Medication Refill - Medication:traMADol (ULTRAM) 50 MG tablet  ? ?Has the patient contacted their pharmacy? yes ?(Agent: If no, request that the patient contact the pharmacy for the refill. If patient does not wish to contact the pharmacy document the reason why and proceed with request.) ?(Agent: If yes, when and what did the pharmacy advise?)contact pcp ? ?Preferred Pharmacy (with phone number or street name): ?[id number] [address] AT [address]:  [phone] ?Fax:  [phone] ?  ? ?Has the patient been seen for an appointment in the last year OR does the patient have an upcoming appointment? yes ? ?Agent: Please be advised that RX refills may take up to 3 business days. We ask that you follow-up with your pharmacy.  ?

## 2021-04-12 NOTE — Telephone Encounter (Signed)
Requested medication (s) are due for refill today: yes ? ?Requested medication (s) are on the active medication list: yes ? ?Last refill:  01/06/21 #120/1  ? ?Future visit scheduled: yes ? ?Notes to clinic:  Unable to refill per protocol, cannot delegate. ? ? ? ?  ?Requested Prescriptions  ?Pending Prescriptions Disp Refills  ? traMADol (ULTRAM) 50 MG tablet 120 tablet 1  ?  Sig: Take 1 tablet (50 mg total) by mouth every 4 (four) hours as needed (Each prescription to last 1 month).  ?  ? Not Delegated - Analgesics:  Opioid Agonists Failed - 04/12/2021  4:43 PM  ?  ?  Failed - This refill cannot be delegated  ?  ?  Failed - Urine Drug Screen completed in last 360 days  ?  ?  Passed - Valid encounter within last 3 months  ?  Recent Outpatient Visits   ? ?      ? 1 month ago Type 2 diabetes mellitus with obesity ([place])  ? [place] Ladell Pier, MD  ? 5 months ago Encounter for [employer] annual wellness exam  ? [place], MD  ? 9 months ago Muscular cramp  ? [place] Ladell Pier, MD  ? 10 months ago Type 2 diabetes mellitus with obesity ([place])  ? [place] Karle Plumber B, MD  ? 1 year ago Type 2 diabetes mellitus with obesity ([place])  ? [place] Ladell Pier, MD  ? ?  ?  ?Future Appointments   ? ?        ? In 2 months Ladell Pier, MD [place]  ? ?  ? ?  ?  ?  ? ?

## 2021-04-17 ENCOUNTER — Other Ambulatory Visit: Payer: Self-pay | Admitting: Internal Medicine

## 2021-04-17 DIAGNOSIS — G8929 Other chronic pain: Secondary | ICD-10-CM

## 2021-04-17 NOTE — Telephone Encounter (Signed)
Requested medication (s) are due for refill today - yes ? ?Requested medication (s) are on the active medication list -yes ? ?Future visit scheduled -yes ? ?Last refill: 01/06/21 #120 1RF ? ?Notes to clinic: Request RF: Possible duplicate request- non delegated Rx ? ?Requested Prescriptions  ?Pending Prescriptions Disp Refills  ? traMADol (ULTRAM) 50 MG tablet [Pharmacy Med Name: TRAMADOL HCL 50 MG TABLET] 120 tablet 1  ?  Sig: TAKE 1 TABLET EVERY 4 (FOUR) HOURS AS NEEDED (EACH PRESCRIPTION TO LAST 1 MONTH).  ?  ? Not Delegated - Analgesics:  Opioid Agonists Failed - 04/17/2021 11:58 AM  ?  ?  Failed - This refill cannot be delegated  ?  ?  Failed - Urine Drug Screen completed in last 360 days  ?  ?  Passed - Valid encounter within last 3 months  ?  Recent Outpatient Visits   ? ?      ? 2 months ago Type 2 diabetes mellitus with obesity ([place])  ? [place] Ladell Pier, MD  ? 6 months ago Encounter for [employer] annual wellness exam  ? [place], MD  ? 9 months ago Muscular cramp  ? [place] Ladell Pier, MD  ? 10 months ago Type 2 diabetes mellitus with obesity ([place])  ? [place] Karle Plumber B, MD  ? 1 year ago Type 2 diabetes mellitus with obesity ([place])  ? [place] Ladell Pier, MD  ? ?  ?  ?Future Appointments   ? ?        ? In 2 months Ladell Pier, MD [place]  ? ?  ? ?  ?  ?  ? ? ? ?Requested Prescriptions  ?Pending Prescriptions Disp Refills  ? traMADol (ULTRAM) 50 MG tablet [Pharmacy Med Name: TRAMADOL HCL 50 MG TABLET] 120 tablet 1  ?  Sig: TAKE 1 TABLET EVERY 4 (FOUR) HOURS AS NEEDED (EACH PRESCRIPTION TO LAST 1 MONTH).  ?  ? Not Delegated - Analgesics:  Opioid Agonists Failed - 04/17/2021 11:58 AM  ?  ?  Failed - This refill cannot be delegated  ?  ?  Failed - Urine Drug  Screen completed in last 360 days  ?  ?  Passed - Valid encounter within last 3 months  ?  Recent Outpatient Visits   ? ?      ? 2 months ago Type 2 diabetes mellitus with obesity ([place])  ? [place] Ladell Pier, MD  ? 6 months ago Encounter for [employer] annual wellness exam  ? [place], MD  ? 9 months ago Muscular cramp  ? [place] Ladell Pier, MD  ? 10 months ago Type 2 diabetes mellitus with obesity ([place])  ? [place] Karle Plumber B, MD  ? 1 year ago Type 2 diabetes mellitus with obesity ([place])  ? [place] Ladell Pier, MD  ? ?  ?  ?Future Appointments   ? ?        ? In 2 months Ladell Pier, MD [place]  ? ?  ? ?  ?  ?  ? ? ? ?

## 2021-04-18 ENCOUNTER — Telehealth: Payer: Self-pay | Admitting: Internal Medicine

## 2021-04-19 NOTE — Telephone Encounter (Signed)
[facility] [address] [facility] spoke [facility]. Pt picked up rx on 04/17/21. Per pharmacist please disregard request  ?

## 2021-05-23 ENCOUNTER — Telehealth: Payer: Self-pay | Admitting: Internal Medicine

## 2021-05-23 ENCOUNTER — Other Ambulatory Visit: Payer: Self-pay | Admitting: Internal Medicine

## 2021-05-23 DIAGNOSIS — G8929 Other chronic pain: Secondary | ICD-10-CM

## 2021-05-23 NOTE — Telephone Encounter (Signed)
Will forward to covering provider.

## 2021-05-23 NOTE — Telephone Encounter (Signed)
Copied from [place] [id number]. Topic: General - Other ?>> May 23, 2021 12:08 PM Tessa Lerner A wrote: ?Reason for CRM: The patient would like a paper stating their inability to climb stairs due to their back, hip and leg concerns  ? ?Please contact further when possible pt has an appt scheduled 5/22. ?

## 2021-05-23 NOTE — Telephone Encounter (Signed)
Copied from [place] [id number]. Topic: General - Other ?>> May 23, 2021 12:07 PM Tessa Lerner A wrote: ?Reason for CRM: Medication Refill - Medication: traMADol (ULTRAM) 50 MG tablet [[id number]]  ? ?Has the patient contacted their pharmacy? No. ?(Agent: If no, request that the patient contact the pharmacy for the refill. If patient does not wish to contact the pharmacy document the reason why and proceed with request.) ?(Agent: If yes, when and what did the pharmacy advise?) ? ?Preferred Pharmacy (with phone number or street name): [id number] [place] [address]?Phone: [phone]: [phone]?Hours: Not open 24 hours ? ?Has the patient been seen for an appointment in the last year OR does the patient have an upcoming appointment? Yes.   ? ?Agent: Please be advised that RX refills may take up to 3 business days. We ask that you follow-up with your pharmacy. ?

## 2021-05-23 NOTE — Telephone Encounter (Signed)
Requested medication (s) are due for refill today:   Provider to review ? ?Requested medication (s) are on the active medication list:   Yes ? ?Future visit scheduled:   Yes ? ? ?Last ordered: 01/06/2021 #120, 1 refill ? ?Non delegated refill reason returned  ? ?Requested Prescriptions  ?Pending Prescriptions Disp Refills  ? traMADol (ULTRAM) 50 MG tablet 120 tablet 1  ?  Sig: Take 1 tablet (50 mg total) by mouth every 4 (four) hours as needed (Each prescription to last 1 month).  ?  ? Not Delegated - Analgesics:  Opioid Agonists Failed - 05/23/2021 12:20 PM  ?  ?  Failed - This refill cannot be delegated  ?  ?  Failed - Urine Drug Screen completed in last 360 days  ?  ?  Failed - Valid encounter within last 3 months  ?  Recent Outpatient Visits   ? ?      ? 3 months ago Type 2 diabetes mellitus with obesity ([place])  ? [place] Ladell Pier, MD  ? 7 months ago Encounter for [employer] annual wellness exam  ? [place], MD  ? 10 months ago Muscular cramp  ? [place] Ladell Pier, MD  ? 11 months ago Type 2 diabetes mellitus with obesity ([place])  ? [place] Karle Plumber B, MD  ? 1 year ago Type 2 diabetes mellitus with obesity ([place])  ? [place] Ladell Pier, MD  ? ?  ?  ?Future Appointments   ? ?        ? In 1 month Ladell Pier, MD [place]  ? ?  ? ? ?  ?  ?  ? ?

## 2021-05-24 MED ORDER — TRAMADOL HCL 50 MG PO TABS: 50.0000 mg | ORAL_TABLET | ORAL | 0 refills | Status: DC | PRN

## 2021-05-24 NOTE — Telephone Encounter (Signed)
Contacted pt and made aware that letter is ready for pick up pt states she will be by tomorrow to pick it up  ?

## 2021-05-24 NOTE — Telephone Encounter (Signed)
Letter is ready for pick-up.

## 2021-06-04 ENCOUNTER — Ambulatory Visit: Payer: Self-pay | Admitting: *Deleted

## 2021-06-04 NOTE — Telephone Encounter (Signed)
Called received by Berton Lan, NP from [facility] insurance secure line [phone] to report concerns regarding depression. Np reports she visited patient to day and paitent lost husband in January. Completed depression screen PHQ9 score 16. Eating only 1 meal a day. Not sleeping only 3-4 hours. Denies suicidal ideations. Requesting to start patient on antidepressant. Paitent is a part of support group through the [place] once a month. Offered support for 8 weeks through insurance and patient declined/ not interested. Next appt 06/25/21. Do you want earlier appt for patient? Please advise. ? ?Reason for Disposition ? [1] Caller requesting NON-URGENT health information AND [2] PCP's office is the best resource ? ?Answer Assessment - Initial Assessment Questions ?1. REASON FOR CALL or QUESTION: "What is your reason for calling today?" or "How can I best help you?" or "What question do you have that I can help answer?" ?    Berton Lan, NP from [facility] requesting antidepressant medication for patient . ? ?Protocols used: Information Only Call - No Triage-A-AH ? ?

## 2021-06-04 NOTE — Telephone Encounter (Signed)
Pt has an appt with provider on 5/22. Will forward to provider  ?

## 2021-06-06 NOTE — Telephone Encounter (Signed)
Contacted pt to schedule a virtual appt pt didn't answer lvm  ?

## 2021-06-07 ENCOUNTER — Ambulatory Visit: Payer: Medicare Other | Attending: Internal Medicine | Admitting: Internal Medicine

## 2021-06-07 DIAGNOSIS — F321 Major depressive disorder, single episode, moderate: Secondary | ICD-10-CM | POA: Diagnosis not present

## 2021-06-07 MED ORDER — SERTRALINE HCL 25 MG PO TABS: 25.0000 mg | ORAL_TABLET | Freq: Every day | ORAL | 3 refills | Status: DC

## 2021-06-07 NOTE — Telephone Encounter (Signed)
Contacted pt to schedule a virtual appt with provider 5/4 at 130pm  ?

## 2021-06-07 NOTE — Progress Notes (Signed)
Patient ID: Rachel Herman, female   DOB: 12/03/1950, 71 y.o.   MRN: [id number] ?Virtual Visit via Telephone Note ? ?I connected with [name] on 06/07/2021 at 1:33 PM by telephone and verified that I am speaking with the correct person using two identifiers ? ?Location: ?Patient: home ?Provider: office ? ?Participants: ?Myself ?Patient ?  ?I discussed the limitations, risks, security and privacy concerns of performing an evaluation and management service by telephone and the availability of in person appointments. I also discussed with the patient that there may be a patient responsible charge related to this service. The patient expressed understanding and agreed to proceed. ? ? ?History of Present Illness: ?Pt with hx of HTN, DM, HL, DJD lumbar and spinal stenosis with hx of laminectomy 2016, obesity, depression, Ductal  CA LT s/p lumpectomy/XRT (2015 and 2016, followed by Dr. Annamaria Boots), vaginal prolapse, CKD 3, RT lung nodule (CTA 10/2018 - stable).  This is an urgent care visit for depression. ? ?Received phone call message from NP from [facility] insurance reporting that she had visited patient on 06/04/2021 and that patient was very depressed since losing her husband in January of this year.  Her PHQ-9 score was 16.  Caller reported that patient was eating only 1 meal a day and not sleeping well.   ?Today patient confirms that history.  Normally in the spring she likes to get out and work in her garden but states she has not been doing that.  She just lacks motivation to do anything anymore.  Endorses decreased appetite.  Her youngest son has moved in with her but he works from 6 AM until after 4 PM.  She states that 4 days after her husband died they found out that her daughter-in-law has stage IV breast cancer and then her son was involved in a major motor vehicle accident where he sustained injury to his spine.  She feels that too much came out her at once.  Denies any suicidal ideation.  She feels she would  benefit from being on an antidepressant.  I see Cymbalta on her med list that was last prescribed in 2021 by Dr. Annamaria Boots.  Patient states she is not on this medication.   ?She had spoken with hospice about 7-8 times since her husband died.  She does not find the sessions helpful. ? ?Outpatient Encounter Medications as of 06/07/2021  ?Medication Sig  ? albuterol (VENTOLIN HFA) 108 (90 Base) MCG/ACT inhaler Inhale 2 puffs into the lungs every 6 (six) hours as needed for wheezing or shortness of breath.  ? Ascorbic Acid (VITAMIN C) 1000 MG tablet Take 1,000 mg by mouth daily.  ? aspirin EC 325 MG tablet Take 325 mg by mouth daily.  ? atorvastatin (LIPITOR) 20 MG tablet Take 1 tablet (20 mg total) by mouth daily. STOP PRAVASTATIN  ? Blood Glucose Monitoring Suppl (ONE TOUCH ULTRA MINI) w/Device KIT USE AS DIRECTED ONCE DAILY DX CODE E11.9  ? carvedilol (COREG) 6.25 MG tablet Take 1.5 tablets (9.375 mg total) by mouth 2 (two) times daily with a meal.  ? cloNIDine (CATAPRES) 0.1 MG tablet Take 1 tablet (0.1 mg total) by mouth 2 (two) times daily.  ? [facility] SENNA PLUS 8.6-50 MG tablet TAKE 2 TABLETS BY MOUTH AT BEDTIME AS NEEDED FOR MILD CONSTIPATION (Patient taking differently: Take 1 tablet by mouth at bedtime.)  ? DULoxetine (CYMBALTA) 20 MG capsule TAKE 1 CAPSULE BY MOUTH EVERY DAY  ? gabapentin (NEURONTIN) 300 MG capsule TAKE 2  CAPSULES (600 MG) BY MOUTH 2 TIMES DAILY.  ? hydrochlorothiazide (HYDRODIURIL) 25 MG tablet TAKE 1 TABLET BY MOUTH EVERYDAY AT BEDTIME  ? metFORMIN (GLUCOPHAGE) 500 MG tablet TAKE 1 & 1/2 TABLETS (750 MG) BY MOUTH 2 TIMES DAILY.  ? Multiple Vitamins-Minerals (MULTIVITAMIN WITH MINERALS) tablet Take 1 tablet by mouth daily. Reported on 03/13/2015  ? omeprazole (PRILOSEC) 20 MG capsule TAKE 1 CAPSULE (20 MG TOTAL) BY MOUTH 2 (TWO) TIMES DAILY BEFORE A MEAL.  ? ONETOUCH DELICA LANCETS 06C MISC USE AS DIRECTED ONCE DAILY DX CODE E11.9  ? ONETOUCH ULTRA test strip TEST ONCE DAILY AS DIRECTED E11.9  ?  potassium chloride (KLOR-CON) 10 MEQ tablet TAKE 1 TABLET BY MOUTH EVERY DAY  ? traMADol (ULTRAM) 50 MG tablet Take 1 tablet (50 mg total) by mouth every 4 (four) hours as needed (Each prescription to last 1 month).  ? ?No facility-administered encounter medications on file as of 06/07/2021.  ? ? ?  ?Observations/Objective: ?No direct observation done as this was a telephone visit. ? ?Assessment and Plan: ?1. Moderate major depression ([place]) ?We discuss management of depression.  She does not wish to do any formal counseling session.  She would like to try an antidepressant.  I told her that we will put her on a low-dose of Zoloft 25 mg to take daily.  She has an appointment with me later this month.  We will reassess progress at that time.  If she experiences any worsening depression or suicidal thoughts she should give me a call. ?- sertraline (ZOLOFT) 25 MG tablet; Take 1 tablet (25 mg total) by mouth daily.  Dispense: 30 tablet; Refill: 3 ? ? ?Follow Up Instructions: ?Keep follow-up appointment later this month. ?  ?I discussed the assessment and treatment plan with the patient. The patient was provided an opportunity to ask questions and all were answered. The patient agreed with the plan and demonstrated an understanding of the instructions. ?  ?The patient was advised to call back or seek an in-person evaluation if the symptoms worsen or if the condition fails to improve as anticipated. ? ?I  Spent 8 minutes on this telephone encounter ? ?This note has been created with Surveyor, quantity. Any transcriptional errors are unintentional. ? ?Karle Plumber, MD ? ?

## 2021-06-25 ENCOUNTER — Encounter: Payer: Self-pay | Admitting: Internal Medicine

## 2021-06-25 ENCOUNTER — Ambulatory Visit: Payer: Medicare Other | Attending: Internal Medicine | Admitting: Internal Medicine

## 2021-06-25 VITALS — BP 114/77 | HR 72 | Ht 65.0 in | Wt 205.0 lb

## 2021-06-25 DIAGNOSIS — D649 Anemia, unspecified: Secondary | ICD-10-CM

## 2021-06-25 DIAGNOSIS — F321 Major depressive disorder, single episode, moderate: Secondary | ICD-10-CM

## 2021-06-25 DIAGNOSIS — E785 Hyperlipidemia, unspecified: Secondary | ICD-10-CM | POA: Diagnosis not present

## 2021-06-25 DIAGNOSIS — M25511 Pain in right shoulder: Secondary | ICD-10-CM | POA: Diagnosis not present

## 2021-06-25 DIAGNOSIS — E1169 Type 2 diabetes mellitus with other specified complication: Secondary | ICD-10-CM | POA: Diagnosis not present

## 2021-06-25 DIAGNOSIS — E1159 Type 2 diabetes mellitus with other circulatory complications: Secondary | ICD-10-CM | POA: Diagnosis not present

## 2021-06-25 DIAGNOSIS — I152 Hypertension secondary to endocrine disorders: Secondary | ICD-10-CM

## 2021-06-25 DIAGNOSIS — E669 Obesity, unspecified: Secondary | ICD-10-CM

## 2021-06-25 DIAGNOSIS — G8929 Other chronic pain: Secondary | ICD-10-CM | POA: Diagnosis not present

## 2021-06-25 DIAGNOSIS — M5416 Radiculopathy, lumbar region: Secondary | ICD-10-CM

## 2021-06-25 LAB — POCT GLYCOSYLATED HEMOGLOBIN (HGB A1C): HbA1c, POC (controlled diabetic range): 6.3 % (ref 0.0–7.0)

## 2021-06-25 MED ORDER — HYDROCHLOROTHIAZIDE 25 MG PO TABS: ORAL_TABLET | ORAL | 1 refills | Status: DC

## 2021-06-25 MED ORDER — TRAMADOL HCL 50 MG PO TABS: 50.0000 mg | ORAL_TABLET | ORAL | 1 refills | Status: DC | PRN

## 2021-06-25 NOTE — Progress Notes (Signed)
Having pain in back and hips Having pain in right shoulder.
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
## 2021-06-26 LAB — CBC
Hematocrit: 39.9 % (ref 34.0–46.6)
Hemoglobin: 12.8 g/dL (ref 11.1–15.9)
MCH: 27.7 pg (ref 26.6–33.0)
MCHC: 32.1 g/dL (ref 31.5–35.7)
MCV: 86 fL (ref 79–97)
Platelets: 307 10*3/uL (ref 150–450)
RBC: 4.62 x10E6/uL (ref 3.77–5.28)
RDW: 14.3 % (ref 11.7–15.4)
WBC: 10.7 10*3/uL (ref 3.4–10.8)

## 2021-06-26 LAB — IRON,TIBC AND FERRITIN PANEL
Ferritin: 42 ng/mL (ref 15–150)
Iron Saturation: 19 % (ref 15–55)
Iron: 66 ug/dL (ref 27–139)
Total Iron Binding Capacity: 356 ug/dL (ref 250–450)
UIBC: 290 ug/dL (ref 118–369)

## 2021-06-26 NOTE — Progress Notes (Signed)
Let patient know that her blood cell count has improved from previous.  Iron level is in the low normal range.  I recommend taking iron supplement twice a week.  This can be purchased over-the-counter as Ferrous Sulfate.

## 2021-07-02 ENCOUNTER — Other Ambulatory Visit: Payer: Self-pay | Admitting: Internal Medicine

## 2021-07-02 DIAGNOSIS — K219 Gastro-esophageal reflux disease without esophagitis: Secondary | ICD-10-CM

## 2021-07-09 ENCOUNTER — Telehealth: Payer: Self-pay

## 2021-07-09 ENCOUNTER — Ambulatory Visit: Payer: Self-pay
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
## 2021-07-09 NOTE — Telephone Encounter (Signed)
Called pt unable to reach pt at this time

## 2021-07-09 NOTE — Telephone Encounter (Signed)
Pt given lab results per notes of Dr. Maricela Bo on 07/09/21. Pt verbalized understanding.

## 2021-07-11 NOTE — Telephone Encounter (Signed)
Called pt unable to reach left VM to call back

## 2021-07-17 NOTE — Telephone Encounter (Signed)
Called pt unable to reach left VM to call back

## 2021-07-23 ENCOUNTER — Other Ambulatory Visit: Payer: Self-pay | Admitting: Internal Medicine

## 2021-07-23 DIAGNOSIS — E876 Hypokalemia: Secondary | ICD-10-CM

## 2021-07-23 DIAGNOSIS — I10 Essential (primary) hypertension: Secondary | ICD-10-CM

## 2021-07-23 NOTE — Telephone Encounter (Signed)
Requested Prescriptions  Pending Prescriptions Disp Refills  . potassium chloride (KLOR-CON) 10 MEQ tablet [Pharmacy Med Name: POTASSIUM CL ER 10 MEQ TABLET] 90 tablet 1    Sig: TAKE 1 TABLET BY [place] DAY     Endocrinology:  Minerals - Potassium Supplementation Passed - 07/23/2021  2:13 AM      Passed - K in normal range and within 360 days    Potassium  Date Value Ref Range Status  04/10/2021 4.1 3.5 - 5.1 mmol/L Final  11/08/2016 3.0 (LL) 3.5 - 5.1 mEq/L Final         Passed - Cr in normal range and within 360 days    Creatinine  Date Value Ref Range Status  10/18/2019 97.4 20.0 - 300.0 mg/dL Final  11/08/2016 1.2 (H) 0.6 - 1.1 mg/dL Final   Creatinine, Ser  Date Value Ref Range Status  04/10/2021 0.94 0.44 - 1.00 mg/dL Final   Creatinine, POC  Date Value Ref Range Status  07/02/2016 '100mg'$  mg/dL Final         Passed - Valid encounter within last 12 months    Recent Outpatient Visits          4 weeks ago Type 2 diabetes mellitus with obesity ([place])   [place] Ladell Pier, MD   1 month ago Moderate major depression [facility])   [place] Karle Plumber B, MD   5 months ago Type 2 diabetes mellitus with obesity [facility])   [place] Ladell Pier, MD   9 months ago Encounter for [employer] annual wellness exam   [place] Ladell Pier, MD   1 year ago Muscular cramp   [place], Deborah B, MD             . carvedilol (COREG) 6.25 MG tablet [Pharmacy Med Name: CARVEDILOL 6.25 MG TABLET] 270 tablet 2    Sig: TAKE 1.5 TABLETS (9.375 MG TOTAL) BY MOUTH 2 (TWO) TIMES DAILY WITH A MEAL.     Cardiovascular: Beta Blockers 3 Passed - 07/23/2021  2:13 AM      Passed - Cr in normal range and within 360 days    Creatinine  Date Value Ref Range Status  10/18/2019 97.4 20.0 - 300.0 mg/dL Final   11/08/2016 1.2 (H) 0.6 - 1.1 mg/dL Final   Creatinine, Ser  Date Value Ref Range Status  04/10/2021 0.94 0.44 - 1.00 mg/dL Final   Creatinine, POC  Date Value Ref Range Status  07/02/2016 '100mg'$  mg/dL Final         Passed - AST in normal range and within 360 days    AST  Date Value Ref Range Status  04/10/2021 20 15 - 41 U/L Final  11/08/2016 24 5 - 34 U/L Final         Passed - ALT in normal range and within 360 days    ALT  Date Value Ref Range Status  04/10/2021 22 0 - 44 U/L Final  11/08/2016 23 0 - 55 U/L Final         Passed - Last BP in normal range    BP Readings from Last 1 Encounters:  06/25/21 114/77         Passed - Last Heart Rate in normal range    Pulse Readings from Last 1 Encounters:  06/25/21 72  Passed - Valid encounter within last 6 months    Recent Outpatient Visits          4 weeks ago Type 2 diabetes mellitus with obesity [facility])   [place] Ladell Pier, MD   1 month ago Moderate major depression [facility])   [place] Karle Plumber B, MD   5 months ago Type 2 diabetes mellitus with obesity [facility])   [place] Ladell Pier, MD   9 months ago Encounter for [employer] annual wellness exam   [place] Ladell Pier, MD   1 year ago Muscular cramp   [place] Ladell Pier, MD

## 2021-07-24 NOTE — Telephone Encounter (Addendum)
Patient aware of message from Dr. Wynetta Emery.

## 2021-08-06 ENCOUNTER — Other Ambulatory Visit: Payer: Self-pay | Admitting: Internal Medicine

## 2021-08-06 DIAGNOSIS — G8929 Other chronic pain: Secondary | ICD-10-CM

## 2021-08-06 NOTE — Telephone Encounter (Signed)
Medication Refill - Medication: traMADol (ULTRAM) 50 MG tablet  Has the patient contacted their pharmacy? Yes.   (Agent: If no, request that the patient contact the pharmacy for the refill. If patient does not wish to contact the pharmacy document the reason why and proceed with request.) (Agent: If yes, when and what did the pharmacy advise?)  Preferred Pharmacy (with phone number or street name):  [id number] [id number] Phone: [phone]: [phone]  Has the patient been seen for an appointment in the last year OR does the patient have an upcoming appointment? Yes.    Agent: Please be advised that RX refills may take up to 3 business days. We ask that you follow-up with your pharmacy.

## 2021-08-08 NOTE — Telephone Encounter (Signed)
Requested medication (s) are due for refill today: yes  Requested medication (s) are on the active medication list:yes  Last refill:  06/25/21  Future visit scheduled:yes  Notes to clinic:  Unable to refill per protocol, cannot delegate.      Requested Prescriptions  Pending Prescriptions Disp Refills   traMADol (ULTRAM) 50 MG tablet 120 tablet 1    Sig: Take 1 tablet (50 mg total) by mouth every 4 (four) hours as needed (Each prescription to last 1 month).     Not Delegated - Analgesics:  Opioid Agonists Failed - 08/06/2021  4:40 PM      Failed - This refill cannot be delegated      Failed - Urine Drug Screen completed in last 360 days      Passed - Valid encounter within last 3 months    Recent Outpatient Visits           1 month ago Type 2 diabetes mellitus with obesity ([place])   [place] Ladell Pier, MD   2 months ago Moderate major depression [facility])   [place] Karle Plumber B, MD   5 months ago Type 2 diabetes mellitus with obesity [facility])   [place] Ladell Pier, MD   9 months ago Encounter for [employer] annual wellness exam   [place] Ladell Pier, MD   1 year ago Muscular cramp   [place] Ladell Pier, MD

## 2021-08-14 NOTE — Addendum Note (Signed)
Addended by: Matilde Sprang on: 08/14/2021 04:53 PM   Modules accepted: Orders

## 2021-08-14 NOTE — Telephone Encounter (Signed)
Requested medication (s) are due for refill today:Yes  Requested medication (s) are on the active medication list: Yes  Last refill:  06/25/21  Future visit scheduled: Yes  Notes to clinic:  Unable to refill per protocol, cannot delegate.      Requested Prescriptions  Pending Prescriptions Disp Refills   traMADol (ULTRAM) 50 MG tablet 120 tablet 1    Sig: Take 1 tablet (50 mg total) by mouth every 4 (four) hours as needed (Each prescription to last 1 month).     Not Delegated - Analgesics:  Opioid Agonists Failed - 08/14/2021  4:53 PM      Failed - This refill cannot be delegated      Failed - Urine Drug Screen completed in last 360 days      Passed - Valid encounter within last 3 months    Recent Outpatient Visits           1 month ago Type 2 diabetes mellitus with obesity ([place])   [place] Ladell Pier, MD   2 months ago Moderate major depression [facility])   [place] Karle Plumber B, MD   5 months ago Type 2 diabetes mellitus with obesity [facility])   [place] Ladell Pier, MD   10 months ago Encounter for [employer] annual wellness exam   [place], Neoma Laming B, MD   1 year ago Muscular cramp   [place], MD              Refused Prescriptions Disp Refills   traMADol (ULTRAM) 50 MG tablet 120 tablet 1    Sig: Take 1 tablet (50 mg total) by mouth every 4 (four) hours as needed (Each prescription to last 1 month).     Not Delegated - Analgesics:  Opioid Agonists Failed - 08/14/2021  4:53 PM      Failed - This refill cannot be delegated      Failed - Urine Drug Screen completed in last 360 days      Passed - Valid encounter within last 3 months    Recent Outpatient Visits           1 month ago Type 2 diabetes mellitus with obesity ([place])   [place] Ladell Pier, MD   2 months ago Moderate major depression [facility])   [place] Karle Plumber B, MD   5 months ago Type 2 diabetes mellitus with obesity [facility])   [place] Ladell Pier, MD   10 months ago Encounter for [employer] annual wellness exam   [place] Ladell Pier, MD   1 year ago Muscular cramp   [place] Ladell Pier, MD

## 2021-08-14 NOTE — Telephone Encounter (Signed)
Medication Refill - Medication: traMADol (ULTRAM) 50 MG tablet Patient states she doesn't have any of this med left Has the patient contacted their pharmacy? yes (Agent: If no, request that the patient contact the pharmacy for the refill. If patient does not wish to contact the pharmacy document the reason why and proceed with request.) (Agent: If yes, when and what did the pharmacy advise?)contact pcp  Preferred Pharmacy (with phone number or street name): [id number] [place] [place] [id number]: [phone]: [phone] Has the patient been seen for an appointment in the last year OR does the patient have an upcoming appointment? yes  Agent: Please be advised that RX refills may take up to 3 business days. We ask that you follow-up with your pharmacy.

## 2021-08-27 DIAGNOSIS — Z961 Presence of intraocular lens: Secondary | ICD-10-CM | POA: Diagnosis not present

## 2021-08-27 DIAGNOSIS — H40013 Open angle with borderline findings, low risk, bilateral: Secondary | ICD-10-CM | POA: Diagnosis not present

## 2021-08-27 DIAGNOSIS — E119 Type 2 diabetes mellitus without complications: Secondary | ICD-10-CM | POA: Diagnosis not present

## 2021-08-27 DIAGNOSIS — H33302 Unspecified retinal break, left eye: Secondary | ICD-10-CM | POA: Diagnosis not present

## 2021-08-27 LAB — HM DIABETES EYE EXAM

## 2021-09-20 ENCOUNTER — Ambulatory Visit
Admission: RE | Admit: 2021-09-20 | Discharge: 2021-09-20 | Disposition: A | Discharge status: Home / Self Care | Payer: Medicare Other | Source: Ambulatory Visit | Attending: Nurse Practitioner | Admitting: Nurse Practitioner

## 2021-09-20 DIAGNOSIS — Z780 Asymptomatic menopausal state: Secondary | ICD-10-CM | POA: Diagnosis not present

## 2021-09-20 DIAGNOSIS — E2839 Other primary ovarian failure: Secondary | ICD-10-CM

## 2021-09-26 ENCOUNTER — Telehealth: Payer: Self-pay

## 2021-09-26 NOTE — Telephone Encounter (Signed)
This nurse spoke with patient and made aware of Bone scan results and provider recommendations of the provider.  Patient acknowledged understanding and has no further questions or concerns and knows to call clinic if the need should arise.

## 2021-09-26 NOTE — Telephone Encounter (Signed)
-----   Message from Alla Feeling, NP sent at 09/21/2021 11:22 AM EDT ----- Please call pt, bone density is normal, great news. Continue calcium/vitamin D and weight bearing exercise such as walking to continue strengthening her bones.  Thanks Regan Rakers, NP

## 2021-09-26 NOTE — Telephone Encounter (Signed)
This nurse left a message for patient to return call to clinic related to scan results and provider recommendations mentioned below. No further concerns at this time.

## 2021-09-30 ENCOUNTER — Other Ambulatory Visit: Payer: Self-pay | Admitting: Internal Medicine

## 2021-09-30 DIAGNOSIS — K219 Gastro-esophageal reflux disease without esophagitis: Secondary | ICD-10-CM

## 2021-10-17 ENCOUNTER — Other Ambulatory Visit: Payer: Self-pay | Admitting: Internal Medicine

## 2021-10-17 DIAGNOSIS — G8929 Other chronic pain: Secondary | ICD-10-CM

## 2021-10-17 NOTE — Telephone Encounter (Signed)
Medication Refill - Medication: traMADol (ULTRAM) 50 MG tablet  Has the patient contacted their pharmacy? Yes.     Preferred Pharmacy (with phone number or street name):  [id number] [place] [place] [address]:  [phone] Fax:  [phone]    Has the patient been seen for an appointment in the last year OR does the patient have an upcoming appointment? Yes.

## 2021-10-18 MED ORDER — TRAMADOL HCL 50 MG PO TABS: 50.0000 mg | ORAL_TABLET | ORAL | 1 refills | Status: DC | PRN

## 2021-10-18 NOTE — Telephone Encounter (Signed)
Requested medication (s) are due for refill today: yes  Requested medication (s) are on the active medication list:yes  Last refill:  06/25/21  Future visit scheduled:yes  Notes to clinic:  Unable to refill per protocol, cannot delegate.      Requested Prescriptions  Pending Prescriptions Disp Refills   traMADol (ULTRAM) 50 MG tablet 120 tablet 1    Sig: Take 1 tablet (50 mg total) by mouth every 4 (four) hours as needed (Each prescription to last 1 month).     Not Delegated - Analgesics:  Opioid Agonists Failed - 10/17/2021  5:29 PM      Failed - This refill cannot be delegated      Failed - Urine Drug Screen completed in last 360 days      Failed - Valid encounter within last 3 months    Recent Outpatient Visits           3 months ago Type 2 diabetes mellitus with obesity ([place])   [place] Ladell Pier, MD   4 months ago Moderate major depression [facility])   [place] Karle Plumber B, MD   8 months ago Type 2 diabetes mellitus with obesity [facility])   [place] [facility] Ladell Pier, MD   1 year ago Encounter for [employer] annual wellness exam   [place] Ladell Pier, MD   1 year ago Muscular cramp   [place] Ladell Pier, MD

## 2021-10-29 ENCOUNTER — Encounter: Payer: Self-pay | Admitting: Internal Medicine

## 2021-10-29 ENCOUNTER — Ambulatory Visit: Payer: Medicare Other | Attending: Internal Medicine | Admitting: Internal Medicine

## 2021-10-29 ENCOUNTER — Other Ambulatory Visit: Payer: Self-pay | Admitting: Internal Medicine

## 2021-10-29 VITALS — BP 107/74 | HR 64 | Temp 98.1°F | Ht 65.0 in | Wt 204.4 lb

## 2021-10-29 DIAGNOSIS — K219 Gastro-esophageal reflux disease without esophagitis: Secondary | ICD-10-CM

## 2021-10-29 DIAGNOSIS — M5416 Radiculopathy, lumbar region: Secondary | ICD-10-CM | POA: Diagnosis not present

## 2021-10-29 DIAGNOSIS — Z23 Encounter for immunization: Secondary | ICD-10-CM

## 2021-10-29 DIAGNOSIS — E1169 Type 2 diabetes mellitus with other specified complication: Secondary | ICD-10-CM | POA: Diagnosis not present

## 2021-10-29 DIAGNOSIS — Z0000 Encounter for general adult medical examination without abnormal findings: Secondary | ICD-10-CM

## 2021-10-29 DIAGNOSIS — Z7189 Other specified counseling: Secondary | ICD-10-CM | POA: Diagnosis not present

## 2021-10-29 DIAGNOSIS — Z1211 Encounter for screening for malignant neoplasm of colon: Secondary | ICD-10-CM

## 2021-10-29 DIAGNOSIS — E669 Obesity, unspecified: Secondary | ICD-10-CM

## 2021-10-29 DIAGNOSIS — G8929 Other chronic pain: Secondary | ICD-10-CM

## 2021-10-29 DIAGNOSIS — M25511 Pain in right shoulder: Secondary | ICD-10-CM | POA: Diagnosis not present

## 2021-10-29 DIAGNOSIS — Z86010 Personal history of colonic polyps: Secondary | ICD-10-CM

## 2021-10-29 MED ORDER — DICLOFENAC SODIUM 1 % EX GEL: 2.0000 g | Freq: Two times a day (BID) | CUTANEOUS | 1 refills | Status: DC | PRN
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
## 2021-10-30 NOTE — Telephone Encounter (Signed)
Requested Prescriptions  Pending Prescriptions Disp Refills  . omeprazole (PRILOSEC) 20 MG capsule [Pharmacy Med Name: OMEPRAZOLE DR 20 MG CAPSULE] 180 capsule 0    Sig: TAKE 1 CAPSULE (20 MG TOTAL) BY MOUTH 2 (TWO) TIMES DAILY BEFORE A MEAL.     Gastroenterology: Proton Pump Inhibitors Passed - 10/29/2021 12:31 PM      Passed - Valid encounter within last 12 months    Recent Outpatient Visits          Yesterday Encounter for Medicare annual wellness exam   [place] Karle Plumber B, MD   4 months ago Type 2 diabetes mellitus with obesity [facility])   [place] Ladell Pier, MD   4 months ago Moderate major depression [facility])   [place], MD   8 months ago Type 2 diabetes mellitus with obesity [facility])   [place] Ladell Pier, MD   1 year ago Encounter for [employer] annual wellness exam   [place], MD      Future Appointments            In 4 months Wynetta Emery Dalbert Batman, MD [place]

## 2021-11-07 ENCOUNTER — Other Ambulatory Visit: Payer: Self-pay | Admitting: Internal Medicine

## 2021-11-07 DIAGNOSIS — M5416 Radiculopathy, lumbar region: Secondary | ICD-10-CM

## 2021-11-07 NOTE — Telephone Encounter (Signed)
Requested Prescriptions  Pending Prescriptions Disp Refills  . gabapentin (NEURONTIN) 300 MG capsule [Pharmacy Med Name: GABAPENTIN 300 MG CAPSULE] 360 capsule 0    Sig: TAKE 2 CAPSULES BY MOUTH 2 TIMES DAILY     Neurology: Anticonvulsants - gabapentin Passed - 11/07/2021 10:23 AM      Passed - Cr in normal range and within 360 days    Creatinine  Date Value Ref Range Status  10/18/2019 97.4 20.0 - 300.0 mg/dL Final  11/08/2016 1.2 (H) 0.6 - 1.1 mg/dL Final   Creatinine, Ser  Date Value Ref Range Status  04/10/2021 0.94 0.44 - 1.00 mg/dL Final   Creatinine, POC  Date Value Ref Range Status  07/02/2016 '100mg'$  mg/dL Final         Passed - Completed PHQ-2 or PHQ-9 in the last 360 days      Passed - Valid encounter within last 12 months    Recent Outpatient Visits          1 week ago Encounter for [employer] annual wellness exam   [place], Neoma Laming B, MD   4 months ago Type 2 diabetes mellitus with obesity ([place])   [place] Ladell Pier, MD   5 months ago Moderate major depression [facility])   [place] Karle Plumber B, MD   8 months ago Type 2 diabetes mellitus with obesity [facility])   [place] Ladell Pier, MD   1 year ago Encounter for [employer] annual wellness exam   [place], MD      Future Appointments            In 3 months Wynetta Emery Dalbert Batman, MD [place]

## 2021-11-11 ENCOUNTER — Other Ambulatory Visit: Payer: Self-pay | Admitting: Internal Medicine

## 2021-11-11 DIAGNOSIS — I10 Essential (primary) hypertension: Secondary | ICD-10-CM

## 2021-12-01 ENCOUNTER — Encounter: Payer: Self-pay | Admitting: Emergency Medicine

## 2021-12-01 ENCOUNTER — Other Ambulatory Visit: Payer: Self-pay

## 2021-12-01 ENCOUNTER — Emergency Department: Payer: Medicare Other

## 2021-12-01 DIAGNOSIS — R55 Syncope and collapse: Secondary | ICD-10-CM | POA: Insufficient documentation

## 2021-12-01 DIAGNOSIS — S0990XA Unspecified injury of head, initial encounter: Secondary | ICD-10-CM | POA: Diagnosis not present

## 2021-12-01 DIAGNOSIS — E119 Type 2 diabetes mellitus without complications: Secondary | ICD-10-CM | POA: Diagnosis not present

## 2021-12-01 DIAGNOSIS — Z743 Need for continuous supervision: Secondary | ICD-10-CM | POA: Diagnosis not present

## 2021-12-01 DIAGNOSIS — R739 Hyperglycemia, unspecified: Secondary | ICD-10-CM | POA: Diagnosis not present

## 2021-12-01 DIAGNOSIS — Y92009 Unspecified place in unspecified non-institutional (private) residence as the place of occurrence of the external cause: Secondary | ICD-10-CM | POA: Insufficient documentation

## 2021-12-01 DIAGNOSIS — R6889 Other general symptoms and signs: Secondary | ICD-10-CM | POA: Diagnosis not present

## 2021-12-01 DIAGNOSIS — W01198A Fall on same level from slipping, tripping and stumbling with subsequent striking against other object, initial encounter: Secondary | ICD-10-CM | POA: Insufficient documentation

## 2021-12-01 DIAGNOSIS — R404 Transient alteration of awareness: Secondary | ICD-10-CM | POA: Diagnosis not present

## 2021-12-01 NOTE — ED Triage Notes (Signed)
Pt to ED via EMS from home. Pt's reports got dizzy and fell. Pt's family member giving history at this time. Pt with small laceration to L cheek. Pt reports + LOC at this time. Pt's son reports pt is undergoing cancer treatment for a tumor in her stomach.
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
## 2021-12-02 ENCOUNTER — Emergency Department
Admission: EM | Admit: 2021-12-02 | Discharge: 2021-12-02 | Disposition: A | Discharge status: Home / Self Care | Payer: Medicare Other | Attending: Emergency Medicine | Admitting: Emergency Medicine

## 2021-12-02 ENCOUNTER — Emergency Department: Payer: Medicare Other

## 2021-12-02 DIAGNOSIS — R55 Syncope and collapse: Secondary | ICD-10-CM

## 2021-12-02 DIAGNOSIS — S0990XA Unspecified injury of head, initial encounter: Secondary | ICD-10-CM

## 2021-12-02 DIAGNOSIS — W19XXXA Unspecified fall, initial encounter: Secondary | ICD-10-CM

## 2021-12-02 LAB — URINALYSIS, ROUTINE W REFLEX MICROSCOPIC
Bacteria, UA: NONE SEEN
Bilirubin Urine: NEGATIVE
Glucose, UA: NEGATIVE mg/dL
Hgb urine dipstick: NEGATIVE
Ketones, ur: NEGATIVE mg/dL
Nitrite: NEGATIVE
Protein, ur: NEGATIVE mg/dL
Specific Gravity, Urine: 1.023 (ref 1.005–1.030)
pH: 5 (ref 5.0–8.0)

## 2021-12-02 LAB — COMPREHENSIVE METABOLIC PANEL
ALT: 20 U/L (ref 0–44)
AST: 30 U/L (ref 15–41)
Albumin: 3.7 g/dL (ref 3.5–5.0)
Alkaline Phosphatase: 53 U/L (ref 38–126)
Anion gap: 14 (ref 5–15)
BUN: 27 mg/dL — ABNORMAL HIGH (ref 8–23)
CO2: 22 mmol/L (ref 22–32)
Calcium: 9.6 mg/dL (ref 8.9–10.3)
Chloride: 105 mmol/L (ref 98–111)
Creatinine, Ser: 1.06 mg/dL — ABNORMAL HIGH (ref 0.44–1.00)
GFR, Estimated: 56 mL/min — ABNORMAL LOW (ref >60)
Glucose, Bld: 118 mg/dL — ABNORMAL HIGH (ref 70–99)
Potassium: 3.7 mmol/L (ref 3.5–5.1)
Sodium: 141 mmol/L (ref 135–145)
Total Bilirubin: 0.5 mg/dL (ref 0.3–1.2)
Total Protein: 7.4 g/dL (ref 6.5–8.1)

## 2021-12-02 LAB — CBC
HCT: 39.1 % (ref 36.0–46.0)
Hemoglobin: 12 g/dL (ref 12.0–15.0)
MCH: 27.6 pg (ref 26.0–34.0)
MCHC: 30.7 g/dL (ref 30.0–36.0)
MCV: 90.1 fL (ref 80.0–100.0)
Platelets: 236 10*3/uL (ref 150–400)
RBC: 4.34 MIL/uL (ref 3.87–5.11)
RDW: 14.6 % (ref 11.5–15.5)
WBC: 10.5 10*3/uL (ref 4.0–10.5)
nRBC: 0 % (ref 0.0–0.2)

## 2021-12-02 LAB — CBG MONITORING, ED: Glucose-Capillary: 102 mg/dL — ABNORMAL HIGH (ref 70–99)

## 2021-12-02 LAB — TROPONIN I (HIGH SENSITIVITY)
Troponin I (High Sensitivity): 4 ng/L (ref <18)
Troponin I (High Sensitivity): 4 ng/L (ref <18)

## 2021-12-02 NOTE — ED Provider Notes (Signed)
[facility] Provider Note    Event Date/Time   First MD Initiated Contact with Patient 12/02/21 [phone]     (approximate)   History   Loss of Consciousness   HPI  Rachel Herman is a 71 y.o. female with past medical history significant for obesity, diabetes, presents to the emergency department with syncopal episode.  Patient states that she had a syncopal episode at home.  States that she was having episodes of dizziness yesterday and believes that she got dizzy and fell.  Did have head injury.  Mild pain to the left side of her face.  Denies any anticoagulation.  States that she is not feeling dizzy at this time.  Denies any nausea or vomiting.  Denies chest pain or shortness of breath.  Denies any dysuria, urinary urgency or frequency.  No recent changes to home medications.  States that she has been ambulating to the bathroom back-and-forth last night without any issues.      Physical Exam   Triage Vital Signs: ED Triage Vitals  Enc Vitals Group     BP 12/01/21 2347 98/75     Pulse Rate 12/01/21 2347 70     Resp 12/01/21 2347 18     Temp 12/01/21 2347 98.7 F (37.1 C)     Temp Source 12/01/21 2347 Oral     SpO2 12/01/21 2347 98 %     Weight 12/01/21 2346 204 lb 5.9 oz (92.7 kg)     Height 12/01/21 2346 '5\' 5"'$  (1.651 m)     Head Circumference --      Peak Flow --      Pain Score 12/01/21 2346 8     Pain Loc --      Pain Edu? --      Excl. in [place]? --     Most recent vital signs: Vitals:   12/02/21 0945 12/02/21 1030  BP: (!) 154/68 (!) 145/49  Pulse: 79 68  Resp: 19 17  Temp:    SpO2: 100% 99%    Physical Exam Constitutional:      Appearance: She is well-developed.  HENT:     Head: Atraumatic.  Eyes:     Conjunctiva/sclera: Conjunctivae normal.  Cardiovascular:     Rate and Rhythm: Regular rhythm.  Pulmonary:     Effort: No respiratory distress.  Abdominal:     General: There is no distension.  Musculoskeletal:         General: Normal range of motion.     Cervical back: Normal range of motion.  Skin:    General: Skin is warm.     Capillary Refill: Capillary refill takes less than 2 seconds.  Neurological:     Mental Status: She is alert. Mental status is at baseline.          IMPRESSION / MDM / ASSESSMENT AND PLAN / ED COURSE  I reviewed the triage vital signs and the nursing notes.  71 year old female presents to the emergency department following a dizziness episode with fall.  On arrival afebrile, hemodynamically stable.  Differential diagnosis including intracranial hemorrhage, ACS, dehydration, urinary tract infection, peripheral vertigo, orthostatic hypotension, PE  Lab work reviewed with no signs of significant anemia.  Creatinine at baseline with no significant electrolyte abnormalities.  UA without signs of urinary tract infection.  CT scan of the head without signs of intracranial hemorrhage or infarction.    EKG  My interpretation of the EKG -normal sinus rhythm.  Normal intervals.  No chamber enlargement.  No significant ST elevation or depression.  No signs of acute ischemia or dysrhythmia.  No tachycardic or bradycardic dysrhythmias while on cardiac telemetry.  RADIOLOGY I independently reviewed imaging, my interpretation of imaging: CT scan of the head showed no signs of intracranial hemorrhage or infarction   Clinical Course as of 12/02/21 1054  Sun Dec 02, 2021  0705 CT HEAD WO CONTRAST [MF]    Clinical Course User Index [MF] Vanessa [place], MD    ED Results / Procedures / Treatments   Labs (all labs ordered are listed, but only abnormal results are displayed) Labs interpreted as -   Lab work reassuring with no significant electrolyte abnormalities.  Normal glucose.  No signs of urinary tract infection.  Troponin is negative, have a low suspicion for ACS.  No chest pain at this time.  Do not feel that serial troponins are necessary at this time.  No signs of an  infectious process.  No signs of a traumatic injury.  No signs of entrapment.  Orthostatic blood pressures are negative.  Able to ambulate in the emergency department without any further episodes of dizziness.  Have low suspicion for posterior CVA.  Patient tolerating p.o.  Will follow-up closely with primary care provider.  Given return precautions.  Labs Reviewed  URINALYSIS, ROUTINE W REFLEX MICROSCOPIC - Abnormal; Notable for the following components:      Result Value   Color, Urine YELLOW (*)    APPearance HAZY (*)    Leukocytes,Ua SMALL (*)    All other components within normal limits  COMPREHENSIVE METABOLIC PANEL - Abnormal; Notable for the following components:   Glucose, Bld 118 (*)    BUN 27 (*)    Creatinine, Ser 1.06 (*)    GFR, Estimated 56 (*)    All other components within normal limits  CBG MONITORING, ED - Abnormal; Notable for the following components:   Glucose-Capillary 102 (*)    All other components within normal limits  CBC  TROPONIN I (HIGH SENSITIVITY)  TROPONIN I (HIGH SENSITIVITY)       PROCEDURES:  Critical Care performed: No  Procedures  Patient's presentation is most consistent with acute presentation with potential threat to life or bodily function.   MEDICATIONS ORDERED IN ED: Medications - No data to display  FINAL CLINICAL IMPRESSION(S) / ED DIAGNOSES   Final diagnoses:  Syncope, unspecified syncope type  Fall, initial encounter  Injury of head, initial encounter     Rx / DC Orders   ED Discharge Orders     None        Note:  This document was prepared using Dragon voice recognition software and may include unintentional dictation errors.   Nathaniel Man, MD 12/02/21 1054

## 2021-12-02 NOTE — Discharge Instructions (Signed)
You are seen in the emergency department following a fall.  You had a CT scan of your head that showed no signs of internal bleeding or broken bones.  Your lab work was normal.  Your troponin (heart enzyme) was normal and your EKG was normal, do not believe that you had a heart attack.  Return to the emergency department if you have any worsening symptoms.  Stay hydrated and drink plenty of fluids.  Apply Neosporin to the wound on your face.

## 2021-12-02 NOTE — ED Notes (Signed)
Orthostatic VS  Lying: HR 63, BP 140/69 Sitting: HR 83, BP 143/84 Standing: HR 91, 154/68

## 2021-12-02 NOTE — ED Notes (Addendum)
Pt via POV from home. Pt states that she had intermittently been dizzy all yesterday. Pt states she did have a syncopal episode yesterday. Pt has a laceration to L cheek. Denies blood thinners. Pt is alert and oriented at this time.

## 2021-12-02 NOTE — ED Notes (Addendum)
Pt able to stand and pivot to the commode without any assistance.

## 2021-12-26 ENCOUNTER — Other Ambulatory Visit: Payer: Self-pay | Admitting: Internal Medicine

## 2021-12-26 DIAGNOSIS — G8929 Other chronic pain: Secondary | ICD-10-CM

## 2021-12-26 DIAGNOSIS — E876 Hypokalemia: Secondary | ICD-10-CM

## 2021-12-26 DIAGNOSIS — E1159 Type 2 diabetes mellitus with other circulatory complications: Secondary | ICD-10-CM

## 2022-01-25 ENCOUNTER — Other Ambulatory Visit: Payer: Self-pay | Admitting: Internal Medicine

## 2022-01-25 DIAGNOSIS — G8929 Other chronic pain: Secondary | ICD-10-CM

## 2022-01-25 NOTE — Telephone Encounter (Signed)
Requested medication (s) are due for refill today: yes  Requested medication (s) are on the active medication list: yes  Last refill:  10/18/21 #120/1  Future visit scheduled: yes  Notes to clinic:  Unable to refill per protocol, cannot delegate.    Requested Prescriptions  Pending Prescriptions Disp Refills   traMADol (ULTRAM) 50 MG tablet [Pharmacy Med Name: TRAMADOL HCL 50 MG TABLET] 120 tablet 1    Sig: TAKE 1 TABLET (50 MG TOTAL) BY MOUTH EVERY 4 HOURS AS NEEDED (EACH PRESCRIPTION TO LAST 1 MONTH)     Not Delegated - Analgesics:  Opioid Agonists Failed - 01/25/2022 12:21 PM      Failed - This refill cannot be delegated      Failed - Urine Drug Screen completed in last 360 days      Passed - Valid encounter within last 3 months    Recent Outpatient Visits           2 months ago Encounter for [employer] annual wellness exam   [place], Neoma Laming B, MD   7 months ago Type 2 diabetes mellitus with obesity [facility])   [place] Ladell Pier, MD   7 months ago Moderate major depression [facility])   [place], MD   11 months ago Type 2 diabetes mellitus with obesity [facility])   [place] Ladell Pier, MD   1 year ago Encounter for [employer] annual wellness exam   [place], MD       Future Appointments             In 1 month Wynetta Emery, Dalbert Batman, MD [place]

## 2022-01-29 NOTE — Telephone Encounter (Signed)
Requesting refill

## 2022-02-17 ENCOUNTER — Other Ambulatory Visit: Payer: Self-pay | Admitting: Internal Medicine

## 2022-02-17 DIAGNOSIS — E876 Hypokalemia: Secondary | ICD-10-CM

## 2022-02-19 ENCOUNTER — Encounter (HOSPITAL_COMMUNITY): Payer: Self-pay | Admitting: Emergency Medicine

## 2022-02-19 ENCOUNTER — Other Ambulatory Visit: Payer: Self-pay

## 2022-02-19 ENCOUNTER — Emergency Department (HOSPITAL_COMMUNITY): Payer: Medicare Other

## 2022-02-19 ENCOUNTER — Emergency Department (HOSPITAL_COMMUNITY)
Admission: EM | Admit: 2022-02-19 | Discharge: 2022-02-20 | Disposition: A | Discharge status: Home / Self Care | Payer: Medicare Other | Attending: Emergency Medicine | Admitting: Emergency Medicine

## 2022-02-19 DIAGNOSIS — Z7982 Long term (current) use of aspirin: Secondary | ICD-10-CM | POA: Diagnosis not present

## 2022-02-19 DIAGNOSIS — Z1152 Encounter for screening for COVID-19: Secondary | ICD-10-CM | POA: Diagnosis not present

## 2022-02-19 DIAGNOSIS — E860 Dehydration: Secondary | ICD-10-CM

## 2022-02-19 DIAGNOSIS — K8020 Calculus of gallbladder without cholecystitis without obstruction: Secondary | ICD-10-CM | POA: Diagnosis not present

## 2022-02-19 DIAGNOSIS — R1084 Generalized abdominal pain: Secondary | ICD-10-CM

## 2022-02-19 DIAGNOSIS — R0602 Shortness of breath: Secondary | ICD-10-CM | POA: Diagnosis not present

## 2022-02-19 DIAGNOSIS — J101 Influenza due to other identified influenza virus with other respiratory manifestations: Secondary | ICD-10-CM | POA: Diagnosis not present

## 2022-02-19 DIAGNOSIS — K5730 Diverticulosis of large intestine without perforation or abscess without bleeding: Secondary | ICD-10-CM | POA: Diagnosis not present

## 2022-02-19 DIAGNOSIS — R1030 Lower abdominal pain, unspecified: Secondary | ICD-10-CM | POA: Diagnosis present

## 2022-02-19 LAB — CBC WITH DIFFERENTIAL/PLATELET
Abs Immature Granulocytes: 0.04 10*3/uL (ref 0.00–0.07)
Basophils Absolute: 0 10*3/uL (ref 0.0–0.1)
Basophils Relative: 0 %
Eosinophils Absolute: 0.2 10*3/uL (ref 0.0–0.5)
Eosinophils Relative: 2 %
HCT: 44.9 % (ref 36.0–46.0)
Hemoglobin: 15.1 g/dL — ABNORMAL HIGH (ref 12.0–15.0)
Immature Granulocytes: 0 %
Lymphocytes Relative: 26 %
Lymphs Abs: 2.8 10*3/uL (ref 0.7–4.0)
MCH: 28.7 pg (ref 26.0–34.0)
MCHC: 33.6 g/dL (ref 30.0–36.0)
MCV: 85.2 fL (ref 80.0–100.0)
Monocytes Absolute: 0.7 10*3/uL (ref 0.1–1.0)
Monocytes Relative: 6 %
Neutro Abs: 7 10*3/uL (ref 1.7–7.7)
Neutrophils Relative %: 66 %
Platelets: 343 10*3/uL (ref 150–400)
RBC: 5.27 MIL/uL — ABNORMAL HIGH (ref 3.87–5.11)
RDW: 14.2 % (ref 11.5–15.5)
WBC: 10.7 10*3/uL — ABNORMAL HIGH (ref 4.0–10.5)
nRBC: 0 % (ref 0.0–0.2)

## 2022-02-19 LAB — LIPASE, BLOOD: Lipase: 35 U/L (ref 11–51)

## 2022-02-19 LAB — COMPREHENSIVE METABOLIC PANEL
ALT: 23 U/L (ref 0–44)
AST: 31 U/L (ref 15–41)
Albumin: 3.7 g/dL (ref 3.5–5.0)
Alkaline Phosphatase: 51 U/L (ref 38–126)
Anion gap: 16 — ABNORMAL HIGH (ref 5–15)
BUN: 19 mg/dL (ref 8–23)
CO2: 25 mmol/L (ref 22–32)
Calcium: 9.4 mg/dL (ref 8.9–10.3)
Chloride: 100 mmol/L (ref 98–111)
Creatinine, Ser: 1.05 mg/dL — ABNORMAL HIGH (ref 0.44–1.00)
GFR, Estimated: 56 mL/min — ABNORMAL LOW (ref >60)
Glucose, Bld: 129 mg/dL — ABNORMAL HIGH (ref 70–99)
Potassium: 3.1 mmol/L — ABNORMAL LOW (ref 3.5–5.1)
Sodium: 141 mmol/L (ref 135–145)
Total Bilirubin: 0.6 mg/dL (ref 0.3–1.2)
Total Protein: 7.9 g/dL (ref 6.5–8.1)

## 2022-02-19 LAB — RESP PANEL BY RT-PCR (RSV, FLU A&B, COVID)  RVPGX2
Influenza A by PCR: POSITIVE — AB
Influenza B by PCR: NEGATIVE
Resp Syncytial Virus by PCR: NEGATIVE
SARS Coronavirus 2 by RT PCR: NEGATIVE

## 2022-02-19 MED ORDER — IOHEXOL 350 MG/ML SOLN
75.0000 mL | Freq: Once | INTRAVENOUS | Status: AC | PRN
  Administered 2022-02-19: 75 mL via INTRAVENOUS

## 2022-02-19 MED ORDER — ONDANSETRON HCL 4 MG/2ML IJ SOLN
4.0000 mg | Freq: Once | INTRAMUSCULAR | Status: AC
  Administered 2022-02-19: 4 mg via INTRAVENOUS
  Filled 2022-02-19: qty 2

## 2022-02-19 NOTE — ED Triage Notes (Signed)
Pt c/o lower abdominal pain starting yesterday. Pt states she has been having n/v/d since Friday with inability to keep any food/fluids down. Denies fevers at home.

## 2022-02-19 NOTE — ED Provider Triage Note (Signed)
Emergency Medicine Provider Triage Evaluation Note  Rachel Herman , a 72 y.o. female  was evaluated in triage.  Pt complains of lower abdominal pain since yesterday with nausea, vomiting, and diarrhea for the last 4 days. She denies fever, chest pain, back pain, dysuria, headache, hematochezia, hematemesis, or melena. States some DOE. Hx COPD. Denies wheezing.   Review of Systems  Positive: See HPI Negative: See HPI  Physical Exam  BP 135/87   Pulse (!) 105   Temp 97.9 F (36.6 C) (Oral)   Resp 16   Ht '5\' 5"'$  (1.651 m)   Wt 86.2 kg   SpO2 97%   BMI 31.62 kg/m  Gen:   Awake, no distress   Resp:  Normal effort LCTA MSK:   Moves extremities without difficulty  Other:  Mildly tachycardic, dry mucus membranes, moderate LLQ tenderness, umbilical hernia with moderate tenderness but soft and no overlying skin changes  Medical Decision Making  Medically screening exam initiated at 8:13 PM.  Appropriate orders placed.  [name] was informed that the remainder of the evaluation will be completed by another provider, this initial triage assessment does not replace that evaluation, and the importance of remaining in the ED until their evaluation is complete.     Suzzette Righter, PA-C 02/19/22 2015

## 2022-02-20 ENCOUNTER — Emergency Department (HOSPITAL_COMMUNITY): Payer: Medicare Other

## 2022-02-20 DIAGNOSIS — K8020 Calculus of gallbladder without cholecystitis without obstruction: Secondary | ICD-10-CM | POA: Diagnosis not present

## 2022-02-20 LAB — URINALYSIS, MICROSCOPIC (REFLEX)

## 2022-02-20 LAB — URINALYSIS, ROUTINE W REFLEX MICROSCOPIC
Glucose, UA: NEGATIVE mg/dL
Ketones, ur: 40 mg/dL — AB
Leukocytes,Ua: NEGATIVE
Nitrite: NEGATIVE
Protein, ur: 30 mg/dL — AB
Specific Gravity, Urine: 1.02 (ref 1.005–1.030)
pH: 5.5 (ref 5.0–8.0)

## 2022-02-20 MED ORDER — FENTANYL CITRATE PF 50 MCG/ML IJ SOSY
12.5000 ug | PREFILLED_SYRINGE | Freq: Once | INTRAMUSCULAR | Status: AC
  Administered 2022-02-20: 12.5 ug via INTRAVENOUS
  Filled 2022-02-20: qty 1

## 2022-02-20 MED ORDER — ONDANSETRON 4 MG PO TBDP: 4.0000 mg | ORAL_TABLET | Freq: Three times a day (TID) | ORAL | 0 refills | Status: DC | PRN

## 2022-02-20 MED ORDER — LACTATED RINGERS IV BOLUS
1000.0000 mL | Freq: Once | INTRAVENOUS | Status: AC
  Administered 2022-02-20: 1000 mL via INTRAVENOUS

## 2022-02-20 MED ORDER — HYDROCODONE-ACETAMINOPHEN 5-325 MG PO TABS: 1.0000 | ORAL_TABLET | Freq: Four times a day (QID) | ORAL | 0 refills | Status: DC | PRN

## 2022-02-20 MED ORDER — ONDANSETRON HCL 4 MG/2ML IJ SOLN
4.0000 mg | Freq: Once | INTRAMUSCULAR | Status: AC
  Administered 2022-02-20: 4 mg via INTRAVENOUS
  Filled 2022-02-20: qty 2

## 2022-02-20 MED ORDER — MORPHINE SULFATE (PF) 4 MG/ML IV SOLN
4.0000 mg | Freq: Once | INTRAVENOUS | Status: AC
  Administered 2022-02-20: 4 mg via INTRAVENOUS
  Filled 2022-02-20: qty 1

## 2022-02-20 NOTE — Discharge Instructions (Signed)
As discussed, today's evaluation has been generally reassuring. Your CAT scan of your abdomen did not show acute new findings.  Your labs demonstrated mild dehydration, consistent with recovering from a viral illness.  Please stay well-hydrated, and monitor your condition carefully.  Follow-up with your physician via telephone tomorrow.  Return here for concerning changes in your condition.

## 2022-02-20 NOTE — ED Provider Notes (Signed)
[facility] EMERGENCY DEPARTMENT Provider Note   CSN: [id number] Arrival date & time: 02/19/22  1933     History  Chief Complaint  Patient presents with   Abdominal Pain    Rachel Herman is a 72 y.o. female.  HPI Patient presents with concern of abdominal pain nausea, vomiting.  Onset was 4 days ago, since that time she has had minimal p.o. intake, has had persistent nausea. Abdominal pain is lower, not improved with anything. History is notable for recent episode of influenza, and she tested positive at urgent care earlier this month.    Home Medications Prior to Admission medications   Medication Sig Start Date End Date Taking? Authorizing Provider  ondansetron (ZOFRAN-ODT) 4 MG disintegrating tablet Take 1 tablet (4 mg total) by mouth every 8 (eight) hours as needed for nausea or vomiting. 02/20/22  Yes Carmin Muskrat, MD  albuterol (VENTOLIN HFA) 108 (90 Base) MCG/ACT inhaler Inhale 2 puffs into the lungs every 6 (six) hours as needed for wheezing or shortness of breath. 07/27/18   Ladell Pier, MD  Ascorbic Acid (VITAMIN C) 1000 MG tablet Take 1,000 mg by mouth daily.    [provider]  aspirin EC 325 MG tablet Take 325 mg by mouth daily.    [provider]  atorvastatin (LIPITOR) 20 MG tablet TAKE 1 TABLET BY MOUTH DAILY. STOP PRAVASTATIN 12/26/21   Ladell Pier, MD  Blood Glucose Monitoring Suppl (ONE TOUCH ULTRA MINI) w/Device KIT USE AS DIRECTED ONCE DAILY DX CODE E11.9 11/10/17   Ladell Pier, MD  carvedilol (COREG) 6.25 MG tablet TAKE 1.5 TABLETS (9.375 MG TOTAL) BY MOUTH 2 (TWO) TIMES DAILY WITH A MEAL. 07/23/21   Ladell Pier, MD  cloNIDine (CATAPRES) 0.1 MG tablet TAKE 1 TABLET BY MOUTH 2 TIMES DAILY. 11/12/21   Ladell Pier, MD  [employer] SENNA PLUS 8.6-50 MG tablet TAKE 2 TABLETS BY MOUTH AT BEDTIME AS NEEDED FOR MILD CONSTIPATION Patient taking differently: Take 1 tablet by mouth at bedtime. 07/11/16    Funches, Adriana Mccallum, MD  diclofenac Sodium (VOLTAREN) 1 % GEL APPLY 2 G TOPICALLY 2 (TWO) TIMES DAILY AS NEEDED. 12/26/21   Ladell Pier, MD  gabapentin (NEURONTIN) 300 MG capsule TAKE 2 CAPSULES BY MOUTH 2 TIMES DAILY 11/07/21   Ladell Pier, MD  hydrochlorothiazide (HYDRODIURIL) 25 MG tablet TAKE 1 TABLET BY MOUTH EVERYDAY AT BEDTIME 12/26/21   Ladell Pier, MD  metFORMIN (GLUCOPHAGE) 500 MG tablet TAKE 1 & 1/2 TABLETS (750 MG) BY MOUTH 2 TIMES DAILY. 02/16/21   Ladell Pier, MD  Multiple Vitamins-Minerals (MULTIVITAMIN WITH MINERALS) tablet Take 1 tablet by mouth daily. Reported on 03/13/2015    [provider]  omeprazole (PRILOSEC) 20 MG capsule TAKE 1 CAPSULE (20 MG TOTAL) BY MOUTH 2 (TWO) TIMES DAILY BEFORE A MEAL. 10/30/21   Ladell Pier, MD  [facility] DELICA LANCETS 27N MISC USE AS DIRECTED ONCE DAILY DX CODE E11.9 11/10/17   Ladell Pier, MD  [facility] ULTRA test strip TEST ONCE DAILY AS DIRECTED E11.9 02/18/21   Ladell Pier, MD  potassium chloride (KLOR-CON) 10 MEQ tablet TAKE 1 TABLET BY MOUTH EVERY DAY 02/18/22   Ladell Pier, MD  traMADol (ULTRAM) 50 MG tablet Take 1 tablet (50 mg total) by mouth every 4 (four) hours as needed (Each prescription to last 1 month). 01/29/22   Ladell Pier, MD      Allergies  Patient has no known allergies.    Review of Systems   Review of Systems  All other systems reviewed and are negative.   Physical Exam Updated Vital Signs BP 138/78 (BP Location: Right Arm)   Pulse 85   Temp 97.8 F (36.6 C) (Oral)   Resp 15   Ht '5\' 5"'$  (1.651 m)   Wt 86.2 kg   SpO2 99%   BMI 31.62 kg/m  Physical Exam Vitals and nursing note reviewed.  Constitutional:      General: She is not in acute distress.    Appearance: She is well-developed.  HENT:     Head: Normocephalic and atraumatic.  Eyes:     Conjunctiva/sclera: Conjunctivae normal.  Cardiovascular:     Rate and Rhythm: Normal rate and regular  rhythm.  Pulmonary:     Effort: Pulmonary effort is normal. No respiratory distress.     Breath sounds: Normal breath sounds. No stridor.  Abdominal:     General: There is no distension.     Tenderness: There is generalized abdominal tenderness. There is no guarding or rebound.  Skin:    General: Skin is warm and dry.  Neurological:     Mental Status: She is alert and oriented to person, place, and time.     Cranial Nerves: No cranial nerve deficit.  Psychiatric:        Mood and Affect: Mood normal.     ED Results / Procedures / Treatments   Labs (all labs ordered are listed, but only abnormal results are displayed) Labs Reviewed  RESP PANEL BY RT-PCR (RSV, FLU A&B, COVID)  RVPGX2 - Abnormal; Notable for the following components:      Result Value   Influenza A by PCR POSITIVE (*)    All other components within normal limits  CBC WITH DIFFERENTIAL/PLATELET - Abnormal; Notable for the following components:   WBC 10.7 (*)    RBC 5.27 (*)    Hemoglobin 15.1 (*)    All other components within normal limits  COMPREHENSIVE METABOLIC PANEL - Abnormal; Notable for the following components:   Potassium 3.1 (*)    Glucose, Bld 129 (*)    Creatinine, Ser 1.05 (*)    GFR, Estimated 56 (*)    Anion gap 16 (*)    All other components within normal limits  URINALYSIS, ROUTINE W REFLEX MICROSCOPIC - Abnormal; Notable for the following components:   Hgb urine dipstick TRACE (*)    Bilirubin Urine MODERATE (*)    Ketones, ur 40 (*)    Protein, ur 30 (*)    All other components within normal limits  URINALYSIS, MICROSCOPIC (REFLEX) - Abnormal; Notable for the following components:   Bacteria, UA FEW (*)    All other components within normal limits  LIPASE, BLOOD    Radiology US Abdomen Limited RUQ (LIVER/GB)  Result Date: 02/20/2022 CLINICAL DATA:  Several day history of abdominal pain EXAM: ULTRASOUND ABDOMEN LIMITED RIGHT UPPER QUADRANT COMPARISON:  CT abdomen and pelvis dated  02/19/2022, ultrasound abdomen dated 09/07/2015 FINDINGS: Gallbladder: Cholelithiasis. No wall thickening visualized. No sonographic Murphy sign noted by sonographer. Common bile duct: Diameter: 2 mm Liver: Ill-defined hypoechoic focus along the gallbladder fossa measures 2.4 x 1.6 x 1.0 cm without associated vascularity on color or power doppler. When compared to prior ultrasound examination on 09/07/2015, there is suggestion of similar area along the gallbladder fossa. Mildly increased in echogenicity. Portal vein is patent on color Doppler imaging with normal direction of blood flow  towards the liver. Other: None. IMPRESSION: 1. Cholelithiasis without sonographic evidence of acute cholecystitis. 2. Ill-defined hypoechoic focus along the gallbladder fossa is favored to represent focal fatty sparing. When compared to prior ultrasound on 09/07/2015, there is suggestion of similar area along the gallbladder fossa. Electronically Signed   By: Darrin Nipper M.D.   On: 02/20/2022 10:17   CT Abdomen Pelvis W Contrast  Result Date: 02/19/2022 CLINICAL DATA:  Left lower quadrant abdominal pain EXAM: CT ABDOMEN AND PELVIS WITH CONTRAST TECHNIQUE: Multidetector CT imaging of the abdomen and pelvis was performed using the standard protocol following bolus administration of intravenous contrast. RADIATION DOSE REDUCTION: This exam was performed according to the departmental dose-optimization program which includes automated exposure control, adjustment of the mA and/or kV according to patient size and/or use of iterative reconstruction technique. CONTRAST:  68m OMNIPAQUE IOHEXOL 350 MG/ML SOLN COMPARISON:  10/19/2018 FINDINGS: Lower chest: Several small nodules demonstrated in the lung bases. Largest is in the right middle lung measuring 6 mm diameter. No change since prior study. Long-term stability suggests benign etiology. Hepatobiliary: Suggestion of tiny layering stones in the gallbladder. No wall thickening or bile duct  dilatation. Liver is unremarkable. Pancreas: Unremarkable. No pancreatic ductal dilatation or surrounding inflammatory changes. Spleen: Normal in size without focal abnormality. Adrenals/Urinary Tract: Adrenal glands are unremarkable. Kidneys are normal, without renal calculi, focal lesion, or hydronephrosis. Bladder is unremarkable. Stomach/Bowel: Stomach, small bowel, and colon are not abnormally distended. No wall thickening or inflammatory changes. Colonic diverticulosis without evidence of acute diverticulitis. Appendix is normal. Vascular/Lymphatic: Aortic atherosclerosis. No enlarged abdominal or pelvic lymph nodes. Reproductive: Status post hysterectomy. No adnexal masses. Other: No free air or free fluid in the abdomen. Moderate-sized periumbilical hernia containing fat. Musculoskeletal: Degenerative changes in the spine and hips. Asymmetrically severe degenerative changes in the right hip. Right hip degenerative changes have progressed since the previous study. IMPRESSION: 1. Diverticulosis of the sigmoid colon without evidence of acute diverticulitis. No evidence of bowel obstruction or inflammation. 2. Cholelithiasis without evidence of acute cholecystitis. 3. Scattered pulmonary nodules, largest on the right measuring 6 mm diameter. No change since prior study suggesting benign etiology. No imaging follow-up is indicated. 4. Aortic atherosclerosis. Electronically Signed   By: WLucienne CapersM.D.   On: 02/19/2022 22:02   DG Chest 2 View  Result Date: 02/19/2022 CLINICAL DATA:  Shortness of breath. EXAM: CHEST - 2 VIEW COMPARISON:  10/19/2018. FINDINGS: The heart size and mediastinal contours are within normal limits. Both lungs are clear. No acute osseous abnormality. IMPRESSION: No active cardiopulmonary disease. Electronically Signed   By: LBrett FairyM.D.   On: 02/19/2022 20:48    Procedures Procedures    Medications Ordered in ED Medications  ondansetron (ZOFRAN) injection 4 mg (4 mg  Intravenous Given 02/19/22 2019)  iohexol (OMNIPAQUE) 350 MG/ML injection 75 mL (75 mLs Intravenous Contrast Given 02/19/22 2154)  lactated ringers bolus 1,000 mL (0 mLs Intravenous Stopped 02/20/22 1314)  fentaNYL (SUBLIMAZE) injection 12.5 mcg (12.5 mcg Intravenous Given 02/20/22 0949)  ondansetron (ZOFRAN) injection 4 mg (4 mg Intravenous Given 02/20/22 0948)  morphine (PF) 4 MG/ML injection 4 mg (4 mg Intravenous Given 02/20/22 1315)    ED Course/ Medical Decision Making/ A&P                             Medical Decision Making Patient with multiple medical problems including obesity, diabetes, hypertension, presents with abdominal pain, nausea, vomiting  in the context of recent influenza infection.  Differential including intra-abdominal infection, bacteremia, sepsis, persistent influenza symptoms, urinary tract infection considered.  On monitor patient has cardiac rhythm 90 sinus normal Pulse ox 100% room air normal   Amount and/or Complexity of Data Reviewed External Data Reviewed: notes. Radiology: ordered. Decision-making details documented in ED Course. ECG/medicine tests:  Decision-making details documented in ED Course.  Risk Prescription drug management. Decision regarding hospitalization.   3:04 PM Patient calm, awake, alert, after additional pain medication is more comfortable. Pulse ox 100% room air normal Cardiac 85 sinus normal  This adult female presents with diffuse abdominal discomfort, in context of recent influenza infection.  Here she is awake, alert, speaking clearly.  Patient has a nonperitoneal, though tender abdomen, CT scan reassuring, however  Labs most consistent with dehydration, consistent with ongoing viral illness. No evidence of bacteremia, sepsis, hospitalization was a consideration given her ongoing pain, but given her improvement here she was discharged in stable condition.  Final Clinical Impression(s) / ED Diagnoses Final diagnoses:   Generalized abdominal pain  Dehydration  Influenza A    Rx / DC Orders ED Discharge Orders          Ordered    ondansetron (ZOFRAN-ODT) 4 MG disintegrating tablet  Every 8 hours PRN        02/20/22 1503              Carmin Muskrat, MD 02/20/22 1507

## 2022-02-20 NOTE — ED Notes (Signed)
Patient Alert and oriented to baseline. Stable and ambulatory to baseline. Patient verbalized understanding of the discharge instructions.  Patient belongings were taken by the patient.   

## 2022-02-25 ENCOUNTER — Telehealth: Payer: Self-pay

## 2022-02-25 NOTE — Patient Outreach (Signed)
  Care Coordination TOC Note Transition Care Management Unsuccessful Follow-up Telephone Call  Date of discharge and from where:  02/20/22-[place]  Attempts:  1st Attempt  Reason for unsuccessful TCM follow-up call:  Left voice message     Enzo Montgomery, RN,BSN,CCM [place] Management Telephonic Care Management Coordinator Direct Phone: [phone] Toll Free: [phone] Fax: [phone]

## 2022-02-26 ENCOUNTER — Telehealth: Payer: Self-pay

## 2022-02-26 NOTE — Patient Outreach (Signed)
  Care Coordination TOC Note Transition Care Management Unsuccessful Follow-up Telephone Call  Date of discharge and from where:  02/20/22-[place]  Attempts:  2nd Attempt  Reason for unsuccessful TCM follow-up call:  No answer/busy    Enzo Montgomery, RN,BSN,CCM [place] Management Telephonic Care Management Coordinator Direct Phone: [phone] Toll Free: [phone] Fax: [phone]

## 2022-02-26 NOTE — Patient Outreach (Signed)
  Care Coordination TOC Note Transition Care Management Follow-up Telephone Call Date of discharge and from where: 02/20/22-[facility]  Dx: "generalized abd pain" Red on EMMI-ED Discharge Alert Reason: "Lost interest in things they used to enjoy? Yes"  "Sad, hopeless, anxious or empty? Yes" Red Alert Date: 02/23/22 How have you been since you were released from the hospital? Patient states that she is still not back to feeling much better. She continues to have nausea at times. She is taking Zofran as ordered with some relief. She was able to eat small portion of oatmeal this morning and baked potato and fish for lunch. Encouraged [employer]. She denies any vomiting or diarrhea. States LBM was yesterday and normal. She is drinking water and Gatorade to stay hydrated. Blood sugars have remained within normal range 78-98 per patient report. She voices some abd cramping at times. She is hopeful she will get further answers about what's wrong with her when she goes to see PCP in a few days.  Reviewed and addressed red alerts. Patient states she has been tired and sleeping a lot-not able to do her normal activities. She voices that she does not normally get sick so this has been hard for her to deal with at times. Support given. She voices family is assisting her. Denies any RN CM needs or concerns at this time. Any questions or concerns? No  Items Reviewed: Did the pt receive and understand the discharge instructions provided? Yes  Medications obtained and verified? Yes  Other? No  Any new allergies since your discharge? No  Dietary orders reviewed? Yes Do you have support at home? Yes -son  [place] and Equipment/Supplies: Were home health services ordered? not applicable If so, what is the name of the agency? NA  Has the agency set up a time to come to the patient's home? not applicable Were any new equipment or medical supplies ordered?  No What is the name of the medical supply agency? N/A Were you  able to get the supplies/equipment? not applicable Do you have any questions related to the use of the equipment or supplies? No  Functional Questionnaire: (I = Independent and D = Dependent) ADLs: I  Bathing/Dressing- I  Meal Prep- I  Eating- I  Maintaining continence- I  Transferring/Ambulation- I  Managing Meds- I  Follow up appointments reviewed:  PCP Hospital f/u appt confirmed?  Scheduled to see Dr. Wynetta Emery on 03/01/22 @ 9:10 am. [facility] f/u appt confirmed?  N/A   Are transportation arrangements needed? No  If their condition worsens, is the pt aware to call PCP or go to the Emergency Dept.? Yes Was the patient provided with contact information for the PCP's office or ED? Yes Was to pt encouraged to call back with questions or concerns? Yes  SDOH assessments and interventions completed:   Yes SDOH Interventions Today    Flowsheet Row Most Recent Value  SDOH Interventions   Food Insecurity Interventions Intervention Not Indicated  Transportation Interventions Intervention Not Indicated       Care Coordination Interventions:  Education provided on GI sx mgmt    Encounter Outcome:  Pt. Visit Completed    Enzo Montgomery, RN,BSN,CCM [place] Management Telephonic Care Management Coordinator Direct Phone: [phone] Toll Free: [phone] Fax: [phone]

## 2022-03-01 ENCOUNTER — Encounter: Payer: Self-pay | Admitting: Internal Medicine

## 2022-03-01 ENCOUNTER — Ambulatory Visit: Payer: Medicare Other | Attending: Internal Medicine | Admitting: Internal Medicine

## 2022-03-01 VITALS — BP 103/72 | HR 75 | Temp 98.2°F | Ht 65.0 in | Wt 187.0 lb

## 2022-03-01 DIAGNOSIS — E669 Obesity, unspecified: Secondary | ICD-10-CM | POA: Diagnosis not present

## 2022-03-01 DIAGNOSIS — E1159 Type 2 diabetes mellitus with other circulatory complications: Secondary | ICD-10-CM | POA: Diagnosis not present

## 2022-03-01 DIAGNOSIS — I152 Hypertension secondary to endocrine disorders: Secondary | ICD-10-CM

## 2022-03-01 DIAGNOSIS — E876 Hypokalemia: Secondary | ICD-10-CM

## 2022-03-01 DIAGNOSIS — N1831 Chronic kidney disease, stage 3a: Secondary | ICD-10-CM | POA: Diagnosis not present

## 2022-03-01 DIAGNOSIS — F321 Major depressive disorder, single episode, moderate: Secondary | ICD-10-CM | POA: Diagnosis not present

## 2022-03-01 DIAGNOSIS — Z1231 Encounter for screening mammogram for malignant neoplasm of breast: Secondary | ICD-10-CM | POA: Diagnosis not present

## 2022-03-01 DIAGNOSIS — K219 Gastro-esophageal reflux disease without esophagitis: Secondary | ICD-10-CM

## 2022-03-01 DIAGNOSIS — E1169 Type 2 diabetes mellitus with other specified complication: Secondary | ICD-10-CM

## 2022-03-01 DIAGNOSIS — M5416 Radiculopathy, lumbar region: Secondary | ICD-10-CM

## 2022-03-01 DIAGNOSIS — E785 Hyperlipidemia, unspecified: Secondary | ICD-10-CM

## 2022-03-01 DIAGNOSIS — G8929 Other chronic pain: Secondary | ICD-10-CM

## 2022-03-01 DIAGNOSIS — Z2821 Immunization not carried out because of patient refusal: Secondary | ICD-10-CM | POA: Diagnosis not present

## 2022-03-01 DIAGNOSIS — Z853 Personal history of malignant neoplasm of breast: Secondary | ICD-10-CM | POA: Diagnosis not present

## 2022-03-01 DIAGNOSIS — R634 Abnormal weight loss: Secondary | ICD-10-CM | POA: Diagnosis not present

## 2022-03-01 LAB — GLUCOSE, POCT (MANUAL RESULT ENTRY): POC Glucose: 119 mg/dl — AB (ref 70–99)

## 2022-03-01 LAB — POCT GLYCOSYLATED HEMOGLOBIN (HGB A1C): HbA1c, POC (controlled diabetic range): 6.1 % (ref 0.0–7.0)

## 2022-03-01 MED ORDER — POTASSIUM CHLORIDE ER 10 MEQ PO TBCR: 10.0000 meq | EXTENDED_RELEASE_TABLET | Freq: Every day | ORAL | 1 refills | Status: DC

## 2022-03-01 MED ORDER — HYDROCHLOROTHIAZIDE 25 MG PO TABS: ORAL_TABLET | ORAL | 1 refills | Status: DC

## 2022-03-01 MED ORDER — TRAMADOL HCL 50 MG PO TABS: 50.0000 mg | ORAL_TABLET | Freq: Four times a day (QID) | ORAL | 1 refills | Status: DC | PRN

## 2022-03-01 MED ORDER — ATORVASTATIN CALCIUM 20 MG PO TABS: ORAL_TABLET | ORAL | 1 refills | Status: DC

## 2022-03-01 MED ORDER — OMEPRAZOLE 20 MG PO CPDR: 20.0000 mg | DELAYED_RELEASE_CAPSULE | Freq: Two times a day (BID) | ORAL | 1 refills | Status: DC

## 2022-03-01 NOTE — Progress Notes (Signed)
Patient ID: Rachel Herman, female    DOB: 23-Aug-1950  MRN: [id number]  CC: Diabetes Management Plan (Dm f/u. Med refills./Er visit on 02/19/2022. Pain on hip, lower back & legs X/Already received flu vax. )   Subjective: Rachel Herman is a 72 y.o. 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 Annamaria Herman), vaginal prolapse, CKD 3, RT lung nodule (CTA 10/2018 - stable).    Had COVID earlier this mth.  Seen in ER 02/20/2022 with abdominal pain, nausea and vomiting.  Assessed to be due to her being on the tail end of COVID infection.  She was given fluids for dehydration.  Potassium level was low at 3.1.  CT of the abdomen revealed diverticulosis without acute diverticulitis, gallstones without signs of acute cholecystitis and incidental finding of scattered small lung nodules unchanged.  She was discharged home with prescription for Vicodin and Zofran.  Today: Patient reports that the abdominal pain and vomiting have stopped.  Requests refill on omeprazole for acid reflux.  DM: Results for orders placed or performed in visit on 03/01/22  POCT glucose (manual entry)  Result Value Ref Range   POC Glucose 119 (A) 70 - 99 mg/dl  POCT glycosylated hemoglobin (Hb A1C)  Result Value Ref Range   Hemoglobin A1C     HbA1c POC (<> result, manual entry)     HbA1c, POC (prediabetic range)     HbA1c, POC (controlled diabetic range) 6.1 0.0 - 7.0 %  Compliant with Metformin 750 mg BID.   Glucometer no longer works.  Does not wish to have new one Poor appetite.  Wgh down 17 lbs since last visit 10/2022.  Dealing with some depression and anxiety from several deaths in 13-May-2021.  Husband died 1 yr ago.  Her daughter-in-law died 2022-02-06 from lung cancer, her best friend died 2021/08/13 and her youngest son's  fianc died tragically in a car crash last month.  She denies any suicidal ideation.  Moving her bowels okay.  No chronic cough.  No blood in his stools or urine.  No fever or night sweats.  No feeling of heart racing.  HTN:  compliant with meds and salt restriction.  Medications include clonidine 0.1 mg twice a day, carvedilol 6.25 mg 1-1/2 tablets twice a day, hydrochlorothiazide 25 mg daily No dizziness, CP/SOB Kidney function has remained stable with GFR in the upper 50s.  HL:  taking and tolerating Lipitor.  Chronic Back Pain:  back still bothersome.  Tramadol helpful.  No falls.  Ambulates with cane today Given Vicodin on recent ER visit.  Filled the rxn but did not take because it made her very nauseated. Took last Tramadol last evening.  She is due for refill.  Also due for updated controlled substance prescribing agreement and urine drug screen which she is agreeable to doing today.  Hx of breast CA: receive letter for [facility].  She plans to call to schedule her appointment.   HM:  declines COVID booster, PCV 20 and RSV vaccines  Patient Active Problem List   Diagnosis Date Noted   Moderate major depression ([place]) 06/07/2021   Pneumococcal vaccination declined 10/18/2019   Chronic cough 08/03/2018   Other intervertebral disc degeneration, lumbar region 03/18/2018   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   Prolapse of vaginal vault  after hysterectomy 08/21/2016   Lumbar pain with radiation down left and right leg 07/02/2016   Thyroid nodule 06/09/2015   Nodule of right lung 07/28/2014   Diverticulosis of colon without hemorrhage 07/28/2014   DJD (degenerative joint disease), lumbar 02/22/2014   Lumbar pain with radiation down left leg 01/17/2014   Breast cancer of upper-inner quadrant of left female breast ([facility]) 01/05/2014   Hyperlipidemia associated with type 2 diabetes mellitus ([place]) 09/28/2013   S/P total hysterectomy and bilateral salpingo-oophorectomy 09/20/2013    Essential hypertension    Diabetes mellitus ([place]) 09/16/2013     Current Outpatient Medications on File Prior to Visit  Medication Sig Dispense Refill   albuterol (VENTOLIN HFA) 108 (90 Base) MCG/ACT inhaler Inhale 2 puffs into the lungs every 6 (six) hours as needed for wheezing or shortness of breath. 8 g 3   Ascorbic Acid (VITAMIN C) 1000 MG tablet Take 1,000 mg by mouth daily.     aspirin EC 325 MG tablet Take 325 mg by mouth daily.     atorvastatin (LIPITOR) 20 MG tablet TAKE 1 TABLET BY MOUTH DAILY. STOP PRAVASTATIN 30 tablet 0   Blood Glucose Monitoring Suppl (ONE TOUCH ULTRA MINI) w/Device KIT USE AS DIRECTED ONCE DAILY DX CODE E11.9 1 each 0   carvedilol (COREG) 6.25 MG tablet TAKE 1.5 TABLETS (9.375 MG TOTAL) BY MOUTH 2 (TWO) TIMES DAILY WITH A MEAL. 270 tablet 2   cloNIDine (CATAPRES) 0.1 MG tablet TAKE 1 TABLET BY MOUTH 2 TIMES DAILY. 180 tablet 1   [facility] SENNA PLUS 8.6-50 MG tablet TAKE 2 TABLETS BY MOUTH AT BEDTIME AS NEEDED FOR MILD CONSTIPATION (Patient taking differently: Take 1 tablet by mouth at bedtime.) 60 tablet 11   diclofenac Sodium (VOLTAREN) 1 % GEL APPLY 2 G TOPICALLY 2 (TWO) TIMES DAILY AS NEEDED. 100 g 0   gabapentin (NEURONTIN) 300 MG capsule TAKE 2 CAPSULES BY MOUTH 2 TIMES DAILY 360 capsule 1   hydrochlorothiazide (HYDRODIURIL) 25 MG tablet TAKE 1 TABLET BY MOUTH EVERYDAY AT BEDTIME 30 tablet 0   metFORMIN (GLUCOPHAGE) 500 MG tablet TAKE 1 & 1/2 TABLETS (750 MG) BY MOUTH 2 TIMES DAILY. 270 tablet 2   Multiple Vitamins-Minerals (MULTIVITAMIN WITH MINERALS) tablet Take 1 tablet by mouth daily. Reported on 03/13/2015     omeprazole (PRILOSEC) 20 MG capsule TAKE 1 CAPSULE (20 MG TOTAL) BY MOUTH 2 (TWO) TIMES DAILY BEFORE A MEAL. 180 capsule 0   ONETOUCH DELICA LANCETS 29B MISC USE AS DIRECTED ONCE DAILY DX CODE E11.9 100 each 12   ONETOUCH ULTRA test strip TEST ONCE DAILY AS DIRECTED E11.9 100 strip 12   potassium chloride (KLOR-CON) 10 MEQ tablet TAKE 1 TABLET BY MOUTH  EVERY DAY 30 tablet 0   HYDROcodone-acetaminophen (NORCO/VICODIN) 5-325 MG tablet Take 1 tablet by mouth every 6 (six) hours as needed for severe pain. (Patient not taking: Reported on 03/01/2022) 15 tablet 0   ondansetron (ZOFRAN-ODT) 4 MG disintegrating tablet Take 1 tablet (4 mg total) by mouth every 8 (eight) hours as needed for nausea or vomiting. (Patient not taking: Reported on 03/01/2022) 20 tablet 0   No current facility-administered medications on file prior to visit.    No Known Allergies  Social History   Socioeconomic History   Marital status: Married    Spouse name: Tanicia Wolaver    Number of children: 3   Years of education: 12   Highest education level: Not on file  Occupational History    Employer: UNEMPLOYED  Tobacco Use   Smoking status: Never   Smokeless tobacco: Never  Vaping Use   Vaping Use: Never used  Substance and Sexual Activity   Alcohol use: No   Drug use: No   Sexual activity: Not Currently    Birth control/protection: Post-menopausal  Other Topics Concern   Not on file  Social History Narrative   Married to [employer] in 1986.    Has 3 children from previous marriage, 1 in [place], 1 in [place], 1 in prison ([place]).    Lives with husband.    Right-handed.   1 cup caffeine per day.   Social Determinants of Health   Financial Resource Strain: Not on file  Food Insecurity: No Food Insecurity (02/26/2022)   Hunger Vital Sign    Worried About Running Out of Food in the Last Year: Never true    Ran Out of Food in the Last Year: Never true  Transportation Needs: No Transportation Needs (02/26/2022)   PRAPARE - Hydrologist (Medical): No    Lack of Transportation (Non-Medical): No  Physical Activity: Not on file  Stress: Not on file  Social Connections: Not on file  Intimate Partner Violence: Not on file    Family History  Problem Relation Age of Onset   Diabetes Mother    Multiple myeloma Mother  30   Hearing loss Mother    Diabetes Brother    Cancer Brother 33       prostate cancer    Diabetes Maternal Aunt    Leukemia Maternal Aunt        dx in her 51s   Alcoholism Brother    Heart attack Father    Diabetes Sister    Diabetes Son     Past Surgical History:  Procedure Laterality Date   ABDOMINAL HYSTERECTOMY N/A 09/20/2013   Procedure: HYSTERECTOMY ABDOMINAL;  Surgeon: Osborne Oman, MD;  Location: [facility];  Service: Gynecology;  Laterality: N/A;   BREAST LUMPECTOMY Left 2015   radiation   BREAST LUMPECTOMY WITH AXILLARY LYMPH NODE BIOPSY Left 12/29/13   left breast    CATARACT EXTRACTION Right    LAPAROSCOPIC ASSISTED VAGINAL HYSTERECTOMY  09/20/2013   Procedure: LAPAROSCOPIC ASSISTED VAGINAL HYSTERECTOMY;  Surgeon: Osborne Oman, MD;  Location: [facility];  Service: Gynecology;;  PT WAS EXAMINED WHILE UP IN [place] AND IT WAS DECIDED TO OPEN PT DUE TO LARGE MASS   LUMBAR LAMINECTOMY/DECOMPRESSION MICRODISCECTOMY Right 12/14/2014   Procedure: Right Lumbar three-four microdiskectomy;  Surgeon: Newman Pies, MD;  Location: [facility];  Service: Neurosurgery;  Laterality: Right;  Right Lumbar three-four microdiskectomy   RADIOACTIVE SEED GUIDED PARTIAL MASTECTOMY WITH AXILLARY SENTINEL LYMPH NODE BIOPSY Left 12/29/2013   Procedure: LEFT BREAST RADIOACTIVE SEED LOCALIZED LUMPECTOMY WITH SENTINEL LYMPH NODE MAPPING;  Surgeon: Erroll Luna, MD;  Location: [facility];  Service: General;  Laterality: Left;   RE-EXCISION OF BREAST LUMPECTOMY Left 03/02/2014   Procedure: RE-EXCISION OF LEFT BREAST LUMPECTOMY;  Surgeon: Erroll Luna, MD;  Location: [facility];  Service: General;  Laterality: Left;   SALPINGOOPHORECTOMY  09/20/2013   Procedure: SALPINGO OOPHORECTOMY;  Surgeon: Osborne Oman, MD;  Location: [facility];  Service: Gynecology;;    ROS: Review of Systems Negative except as stated above  PHYSICAL EXAM: BP 103/72 (BP Location: Left  Arm, Patient Position: Sitting, Cuff Size: Large)   Pulse 75   Temp 98.2 F (36.8 C) (Oral)   Ht '5\' 5"'$  (1.651  m)   Wt 187 lb (84.8 kg)   SpO2 99%   BMI 31.12 kg/m   Wt Readings from Last 3 Encounters:  03/01/22 187 lb (84.8 kg)  02/19/22 190 lb (86.2 kg)  12/01/21 204 lb 5.9 oz (92.7 kg)    Physical Exam  General appearance -older Caucasian female in NAD.  She has noted weight loss.  She ambulates with a cane and appears frail. Mental status - normal mood, behavior, speech, dress, motor activity, and thought processes Mouth - mucous membranes moist, pharynx normal without lesions Neck - supple, no significant adenopathy.  No thyroid enlargement or nodules appreciated. Chest - clear to auscultation, no wheezes, rales or rhonchi, symmetric air entry Heart - normal rate, regular rhythm, normal S1, S2, no murmurs, rubs, clicks or gallops Extremities - peripheral pulses normal, no pedal edema, no clubbing or cyanosis    03/01/2022    9:13 AM 10/29/2021   10:35 AM 06/25/2021   11:07 AM  Depression screen PHQ 2/9  Decreased Interest 2 0 1  Down, Depressed, Hopeless 2 0 2  PHQ - 2 Score 4 0 3  Altered sleeping 2  2  Tired, decreased energy 3  3  Change in appetite 3  2  Feeling bad or failure about yourself  0  1  Trouble concentrating 0  0  Moving slowly or fidgety/restless 0  0  Suicidal thoughts 0  0  PHQ-9 Score 12  11      03/01/2022    9:13 AM 06/25/2021   11:07 AM 10/17/2020   11:01 AM 10/18/2019   11:16 AM  GAD 7 : Generalized Anxiety Score  Nervous, Anxious, on Edge 2 0 0 0  Control/stop worrying 2 3 0 0  Worry too much - different things 1 3 0 0  Trouble relaxing '3 1 1 '$ 0  Restless 2 1 0 0  Easily annoyed or irritable 2 1 0 0  Afraid - awful might happen 2 3 0 0  Total GAD 7 Score '14 12 1 '$ 0         Latest Ref Rng & Units 02/19/2022    8:20 PM 12/01/2021   11:48 PM 04/10/2021   10:10 AM  CMP  Glucose 70 - 99 mg/dL 129  118  139   BUN 8 - 23 mg/dL '19  27  21    '$ Creatinine 0.44 - 1.00 mg/dL 1.05  1.06  0.94   Sodium 135 - 145 mmol/L 141  141  143   Potassium 3.5 - 5.1 mmol/L 3.1  3.7  4.1   Chloride 98 - 111 mmol/L 100  105  105   CO2 22 - 32 mmol/L '25  22  31   '$ Calcium 8.9 - 10.3 mg/dL 9.4  9.6  9.1   Total Protein 6.5 - 8.1 g/dL 7.9  7.4  6.9   Total Bilirubin 0.3 - 1.2 mg/dL 0.6  0.5  0.4   Alkaline Phos 38 - 126 U/L 51  53  47   AST 15 - 41 U/L '31  30  20   '$ ALT 0 - 44 U/L '23  20  22    '$ Lipid Panel     Component Value Date/Time   CHOL 198 02/21/2021 1148   TRIG 349 (H) 02/21/2021 1148   HDL 38 (L) 02/21/2021 1148   CHOLHDL 5.2 (H) 02/21/2021 1148   CHOLHDL 5.9 09/27/2013 1053   VLDL 48 (H) 09/27/2013 1053   LDLCALC 101 (H) 02/21/2021 1148  CBC    Component Value Date/Time   WBC 10.7 (H) 02/19/2022 2020   RBC 5.27 (H) 02/19/2022 2020   HGB 15.1 (H) 02/19/2022 2020   HGB 12.8 06/25/2021 1154   HGB 14.5 11/08/2016 0915   HCT 44.9 02/19/2022 2020   HCT 39.9 06/25/2021 1154   HCT 44.2 11/08/2016 0915   PLT 343 02/19/2022 2020   PLT 307 06/25/2021 1154   MCV 85.2 02/19/2022 2020   MCV 86 06/25/2021 1154   MCV 85.3 11/08/2016 0915   MCH 28.7 02/19/2022 2020   MCHC 33.6 02/19/2022 2020   RDW 14.2 02/19/2022 2020   RDW 14.3 06/25/2021 1154   RDW 15.1 (H) 11/08/2016 0915   LYMPHSABS 2.8 02/19/2022 2020   LYMPHSABS 1.7 11/08/2016 0915   MONOABS 0.7 02/19/2022 2020   MONOABS 0.7 11/08/2016 0915   EOSABS 0.2 02/19/2022 2020   EOSABS 0.1 11/08/2016 0915   BASOSABS 0.0 02/19/2022 2020   BASOSABS 0.0 11/08/2016 0915    ASSESSMENT AND PLAN: 1. Type 2 diabetes mellitus with obesity (HCC) At goal.  She will continue metformin.  Encourage healthy eating habits - POCT glucose (manual entry) - POCT glycosylated hemoglobin (Hb A1C) - Microalbumin / creatinine urine ratio  2. Moderate major depression ([place]) We discussed treatment of her depression and anxiety with medication +/- CBT.  Patient declined both.  She states that she  talks with her pastor regularly for counseling.  3. Weight loss, non-intentional Most likely related to depression. We will make sure she is up-to-date with age-appropriate cancer screenings.  I have submitted referral for her mammogram.  Recent CAT scan of the abdomen showed several small nodules in the lung bases largest in the right middle lung of 6 mm stable in size suggesting benign etiology Recommend eating smaller but more frequent meals - TSH+T4F+T3Free  4. Hypertension associated with diabetes ([place]) At goal.  Continue current medications listed above - hydrochlorothiazide (HYDRODIURIL) 25 MG tablet; 1 tab PO daily  Dispense: 90 tablet; Refill: 1  5. Hyperlipidemia associated with type 2 diabetes mellitus (HCC) - atorvastatin (LIPITOR) 20 MG tablet; TAKE 1 TABLET BY MOUTH DAILY. STOP PRAVASTATIN  Dispense: 90 tablet; Refill: 1  6. Chronic radicular pain of lower back We updated her controlled substance prescribing agreement today.  I went over this with her.  She is agreeable to urine drug screen.  [place] controlled substance reporting system reviewed.  Advised patient to get rid of the Vicodin since she did not tolerate them.  Advised against taking Vicodin with tramadol to prevent unintentional overdose or respiratory suppression.  She expressed understanding. - traMADol (ULTRAM) 50 MG tablet; Take 1 tablet (50 mg total) by mouth every 6 (six) hours as needed. Each prescription to last a mth  Dispense: 120 tablet; Refill: 1 - [id number] 11+Oxyco+Alc+Crt-Bund  7. Gastroesophageal reflux disease without esophagitis - omeprazole (PRILOSEC) 20 MG capsule; Take 1 capsule (20 mg total) by mouth 2 (two) times daily before a meal.  Dispense: 180 capsule; Refill: 1  8. Hypokalemia - potassium chloride (KLOR-CON) 10 MEQ tablet; Take 1 tablet (10 mEq total) by mouth daily.  Dispense: 90 tablet; Refill: 1 - Basic Metabolic Panel  9. Stage 3a chronic kidney disease (HCC) Stable.  10.  Encounter for screening mammogram for malignant neoplasm of breast - MM Digital Screening; Future  11. History of left breast cancer - MM Digital Screening; Future  12. Pneumococcal vaccination declined Recommended.  Patient declined.     Patient was given the opportunity  to ask questions.  Patient verbalized understanding of the plan and was able to repeat key elements of the plan.   This documentation was completed using Radio producer.  Any transcriptional errors are unintentional.  Orders Placed This Encounter  Procedures   POCT glucose (manual entry)   POCT glycosylated hemoglobin (Hb A1C)     Requested Prescriptions    No prescriptions requested or ordered in this encounter    No follow-ups on file.  Karle Plumber, MD, FACP

## 2022-03-04 LAB — BASIC METABOLIC PANEL
BUN/Creatinine Ratio: 22 (ref 12–28)
BUN: 20 mg/dL (ref 8–27)
CO2: 24 mmol/L (ref 20–29)
Calcium: 10.2 mg/dL (ref 8.7–10.3)
Chloride: 99 mmol/L (ref 96–106)
Creatinine, Ser: 0.93 mg/dL (ref 0.57–1.00)
Glucose: 102 mg/dL — ABNORMAL HIGH (ref 70–99)
Potassium: 4.3 mmol/L (ref 3.5–5.2)
Sodium: 143 mmol/L (ref 134–144)
eGFR: 65 mL/min/{1.73_m2} (ref >59)

## 2022-03-04 LAB — TSH+T4F+T3FREE
Free T4: 1.19 ng/dL (ref 0.82–1.77)
T3, Free: 3.3 pg/mL (ref 2.0–4.4)
TSH: 2.29 u[IU]/mL (ref 0.450–4.500)

## 2022-03-04 LAB — MICROALBUMIN / CREATININE URINE RATIO
Creatinine, Urine: 178.2 mg/dL
Microalb/Creat Ratio: 8 mg/g creat (ref 0–29)
Microalbumin, Urine: 14.6 ug/mL

## 2022-04-03 ENCOUNTER — Telehealth: Payer: Self-pay | Admitting: Nurse Practitioner

## 2022-04-03 NOTE — Telephone Encounter (Signed)
Per 2/28 IB reached out to reschedule patients appointment, patient having mammogram on the 21st and wants to wait itll after that to see Lacie.

## 2022-04-11 ENCOUNTER — Inpatient Hospital Stay: Payer: Medicare Other

## 2022-04-11 ENCOUNTER — Inpatient Hospital Stay: Payer: Medicare Other | Admitting: Nurse Practitioner

## 2022-04-22 ENCOUNTER — Other Ambulatory Visit: Payer: Self-pay | Admitting: Internal Medicine

## 2022-04-22 DIAGNOSIS — I10 Essential (primary) hypertension: Secondary | ICD-10-CM

## 2022-04-25 ENCOUNTER — Other Ambulatory Visit: Payer: Self-pay | Admitting: Internal Medicine

## 2022-04-25 ENCOUNTER — Ambulatory Visit
Admission: RE | Admit: 2022-04-25 | Discharge: 2022-04-25 | Disposition: A | Discharge status: Home / Self Care | Payer: Medicare Other | Source: Ambulatory Visit | Attending: Internal Medicine | Admitting: Internal Medicine

## 2022-04-25 DIAGNOSIS — Z853 Personal history of malignant neoplasm of breast: Secondary | ICD-10-CM

## 2022-04-25 DIAGNOSIS — E669 Obesity, unspecified: Secondary | ICD-10-CM

## 2022-04-25 DIAGNOSIS — G8929 Other chronic pain: Secondary | ICD-10-CM

## 2022-04-25 DIAGNOSIS — K219 Gastro-esophageal reflux disease without esophagitis: Secondary | ICD-10-CM

## 2022-04-25 DIAGNOSIS — E876 Hypokalemia: Secondary | ICD-10-CM

## 2022-04-25 DIAGNOSIS — Z2821 Immunization not carried out because of patient refusal: Secondary | ICD-10-CM

## 2022-04-25 DIAGNOSIS — E1159 Type 2 diabetes mellitus with other circulatory complications: Secondary | ICD-10-CM

## 2022-04-25 DIAGNOSIS — Z1231 Encounter for screening mammogram for malignant neoplasm of breast: Secondary | ICD-10-CM

## 2022-04-25 DIAGNOSIS — R634 Abnormal weight loss: Secondary | ICD-10-CM

## 2022-04-25 DIAGNOSIS — N1831 Chronic kidney disease, stage 3a: Secondary | ICD-10-CM

## 2022-04-25 DIAGNOSIS — F321 Major depressive disorder, single episode, moderate: Secondary | ICD-10-CM

## 2022-04-25 DIAGNOSIS — E1169 Type 2 diabetes mellitus with other specified complication: Secondary | ICD-10-CM

## 2022-05-01 ENCOUNTER — Other Ambulatory Visit: Payer: Self-pay | Admitting: Nurse Practitioner

## 2022-05-01 DIAGNOSIS — Z170 Estrogen receptor positive status [ER+]: Secondary | ICD-10-CM

## 2022-05-01 NOTE — Progress Notes (Unsigned)
Patient Care Team: Ladell Pier, MD as PCP - General (Internal Medicine) Boykin Nearing, MD as Consulting Physician (Family Medicine) Erroll Luna, MD as Consulting Physician (General Surgery) Truitt Merle, MD as Consulting Physician (Hematology) Kyung Rudd, MD as Consulting Physician (Radiation Oncology)   CHIEF COMPLAINT: Follow up left breast cancer   Oncology History Overview Note  Malignant neoplasm of upper inner quadrant of female breast   Staging form: Breast, AJCC 7th Edition     Clinical: Stage IA (T1c, N0, M0) - Unsigned     Pathologic: No stage assigned - Unsigned     Breast cancer of upper-inner quadrant of left female breast ([place])  11/23/2013 Imaging   Ultrasound shows angulated hypoechoic mass at the left breast 10 o'clock 18 cm from nipple measuring 1.82 x 0.71 x 0.88 cm. Ultrasound of the left axilla is negative.      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    12/29/2013 Surgery   Left lumpectomy with close (<0.1cm) inferior margin   03/02/2014 Surgery   Reexcision for close margin, path negative for malignancy.   04/05/2014 - 05/20/2014 Radiation Therapy   adjuvant breast radiation    05/27/2014 Imaging   Bone density scan: normal    06/07/2014 - 03/2020 Anti-estrogen oral therapy   Exemestane 25mg  daily, started on 06/07/2014. She stopped in Nov 2018 due to high copay. Changed to anastrozole on 05/09/2017. Completed in 03/2020.    06/20/2015 Imaging   Bone scan 06/20/2015 IMPRESSION: No evidence of metastatic disease. Mild increased activity dorsal aspect right midfoot probable degenerative in nature. Clinical correlation is necessary.   02/29/2016 Mammogram   MM DIAG BREAST TOMO BIALTERAL 02/29/16 IMPRESSION: Stable left breast lumpectomy site. No mammographic evidence of malignancy in the bilateral breasts.   03/03/2017 Mammogram   IMPRESSION: 1. No  mammographic evidence of malignancy in either breast. 2. Stable left breast posttreatment changes.      CURRENT THERAPY: Surveillance   INTERVAL HISTORY Ms. Metzel returns for follow up as scheduled. Last seen by me 04/10/21.  Mammogram last week was benign.  She has chronic right hip and leg pain that has worsened in the past 6 months.  May be time for another injection.  She has been dealing with GI issues which began as cramping, now with constipation.  She was seen in ED 02/2022 with abdominal pain, nausea/vomiting after the flu.  Symptoms have improved but not resolved.  She has also lost weight unintentionally since December after losing 2 daughters in law unexpectedly, 1 to cancer and 1 to a car accident.  She felt depressed, medication did not help.  Denies any other new specific complaints.  ROS  All other systems reviewed and negative  Past Medical History:  Diagnosis Date   Breast cancer ([place]) 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   Diabetes mellitus ([place]) 09/16/13   Diagnosed on 09/16/13; HgA1C was 7.2/metformin   Dizziness    GERD (gastroesophageal reflux disease)    Headache    Hyperlipidemia    Hypertension 2014   Low back pain    Obesity, morbid, BMI 40.0-49.9 (HCC)    PONV (postoperative nausea and vomiting)    S/P radiation therapy 04/05/14-05/20/14   left breast 60.4Gy totaldose     Past Surgical History:  Procedure Laterality Date   ABDOMINAL HYSTERECTOMY N/A 09/20/2013   Procedure: HYSTERECTOMY ABDOMINAL;  Surgeon: Osborne Oman, MD;  Location: [facility]  ORS;  Service: Gynecology;  Laterality: N/A;   BREAST LUMPECTOMY Left 2015   radiation   BREAST LUMPECTOMY WITH AXILLARY LYMPH NODE BIOPSY Left 12/29/13   left breast    CATARACT EXTRACTION Right    LAPAROSCOPIC ASSISTED VAGINAL HYSTERECTOMY  09/20/2013   Procedure: LAPAROSCOPIC ASSISTED VAGINAL HYSTERECTOMY;  Surgeon: Osborne Oman, MD;  Location: [place] [facility]S;  Service: Gynecology;;  PT WAS EXAMINED WHILE  UP IN STIRRUPS AND IT WAS DECIDED TO OPEN PT DUE TO LARGE MASS   LUMBAR LAMINECTOMY/DECOMPRESSION MICRODISCECTOMY Right 12/14/2014   Procedure: Right Lumbar three-four microdiskectomy;  Surgeon: Newman Pies, MD;  Location: [place] [facility];  Service: Neurosurgery;  Laterality: Right;  Right Lumbar three-four microdiskectomy   RADIOACTIVE SEED GUIDED PARTIAL MASTECTOMY WITH AXILLARY SENTINEL LYMPH NODE BIOPSY Left 12/29/2013   Procedure: LEFT BREAST RADIOACTIVE SEED LOCALIZED LUMPECTOMY WITH SENTINEL LYMPH NODE MAPPING;  Surgeon: Erroll Luna, MD;  Location: [place];  Service: General;  Laterality: Left;   RE-EXCISION OF BREAST LUMPECTOMY Left 03/02/2014   Procedure: RE-EXCISION OF LEFT BREAST LUMPECTOMY;  Surgeon: Erroll Luna, MD;  Location: [place];  Service: General;  Laterality: Left;   SALPINGOOPHORECTOMY  09/20/2013   Procedure: SALPINGO OOPHORECTOMY;  Surgeon: Osborne Oman, MD;  Location: [facility];  Service: Gynecology;;     Outpatient Encounter Medications as of 05/02/2022  Medication Sig   albuterol (VENTOLIN HFA) 108 (90 Base) MCG/ACT inhaler Inhale 2 puffs into the lungs every 6 (six) hours as needed for wheezing or shortness of breath.   Ascorbic Acid (VITAMIN C) 1000 MG tablet Take 1,000 mg by mouth daily.   aspirin EC 325 MG tablet Take 325 mg by mouth daily.   atorvastatin (LIPITOR) 20 MG tablet TAKE 1 TABLET BY MOUTH DAILY. STOP PRAVASTATIN   Blood Glucose Monitoring Suppl (ONE TOUCH ULTRA MINI) w/Device KIT USE AS DIRECTED ONCE DAILY DX CODE E11.9   carvedilol (COREG) 6.25 MG tablet TAKE 1.5 TABLETS (9.375 MG TOTAL) BY MOUTH 2 (TWO) TIMES DAILY WITH A MEAL.   cloNIDine (CATAPRES) 0.1 MG tablet TAKE 1 TABLET BY MOUTH 2 TIMES DAILY.   [employer] SENNA PLUS 8.6-50 MG tablet TAKE 2 TABLETS BY MOUTH AT BEDTIME AS NEEDED FOR MILD CONSTIPATION (Patient taking differently: Take 1 tablet by mouth at bedtime.)   diclofenac Sodium (VOLTAREN) 1 % GEL APPLY 2 G  TOPICALLY 2 (TWO) TIMES DAILY AS NEEDED.   gabapentin (NEURONTIN) 300 MG capsule TAKE 2 CAPSULES BY MOUTH 2 TIMES DAILY   hydrochlorothiazide (HYDRODIURIL) 25 MG tablet 1 tab PO daily   metFORMIN (GLUCOPHAGE) 500 MG tablet TAKE 1 & 1/2 TABLETS (750 MG) BY MOUTH 2 TIMES DAILY.   Multiple Vitamins-Minerals (MULTIVITAMIN WITH MINERALS) tablet Take 1 tablet by mouth daily. Reported on 03/13/2015   omeprazole (PRILOSEC) 20 MG capsule Take 1 capsule (20 mg total) by mouth 2 (two) times daily before a meal.   ONETOUCH DELICA LANCETS 99991111 MISC USE AS DIRECTED ONCE DAILY DX CODE E11.9   ONETOUCH ULTRA test strip TEST ONCE DAILY AS DIRECTED E11.9   potassium chloride (KLOR-CON) 10 MEQ tablet Take 1 tablet (10 mEq total) by mouth daily.   traMADol (ULTRAM) 50 MG tablet Take 1 tablet (50 mg total) by mouth every 6 (six) hours as needed. Each prescription to last a mth   No facility-administered encounter medications on file as of 05/02/2022.     Today's Vitals   05/02/22 1129  BP: 130/67  Pulse: 73  Resp: 16  Temp:  98.4 F (36.9 C)  TempSrc: Temporal  SpO2: 98%  Weight: 178 lb 8 oz (81 kg)   Body mass index is 29.7 kg/m.   PHYSICAL EXAM GENERAL:alert, no distress and comfortable SKIN: no rash  EYES: sclera clear NECK: without mass LYMPH:  no palpable cervical or supraclavicular lymphadenopathy  LUNGS:  normal breathing effort HEART: no lower extremity edema ABDOMEN: abdomen soft with normal bowel sounds. TTP in the middle abdomen NEURO: alert & oriented x 3 with fluent speech Breast exam: No nipple discharge or inversion.  S/p left lumpectomy, incisions completely healed.  No palpable mass or nodularity in either breast or axilla that I could appreciate.   CBC    Component Value Date/Time   WBC 9.1 05/02/2022 1041   WBC 10.7 (H) 02/19/2022 2020   RBC 4.60 05/02/2022 1041   HGB 13.1 05/02/2022 1041   HGB 12.8 06/25/2021 1154   HGB 14.5 11/08/2016 0915   HCT 40.9 05/02/2022 1041    HCT 39.9 06/25/2021 1154   HCT 44.2 11/08/2016 0915   PLT 282 05/02/2022 1041   PLT 307 06/25/2021 1154   MCV 88.9 05/02/2022 1041   MCV 86 06/25/2021 1154   MCV 85.3 11/08/2016 0915   MCH 28.5 05/02/2022 1041   MCHC 32.0 05/02/2022 1041   RDW 14.2 05/02/2022 1041   RDW 14.3 06/25/2021 1154   RDW 15.1 (H) 11/08/2016 0915   LYMPHSABS 2.4 05/02/2022 1041   LYMPHSABS 1.7 11/08/2016 0915   MONOABS 0.6 05/02/2022 1041   MONOABS 0.7 11/08/2016 0915   EOSABS 0.3 05/02/2022 1041   EOSABS 0.1 11/08/2016 0915   BASOSABS 0.1 05/02/2022 1041   BASOSABS 0.0 11/08/2016 0915     CMP     Component Value Date/Time   NA 142 05/02/2022 1041   NA 143 03/01/2022 1010   NA 143 11/08/2016 0915   K 3.6 05/02/2022 1041   K 3.0 (LL) 11/08/2016 0915   CL 101 05/02/2022 1041   CO2 31 05/02/2022 1041   CO2 30 (H) 11/08/2016 0915   GLUCOSE 101 (H) 05/02/2022 1041   GLUCOSE 114 11/08/2016 0915   BUN 19 05/02/2022 1041   BUN 20 03/01/2022 1010   BUN 18.9 11/08/2016 0915   CREATININE 1.03 (H) 05/02/2022 1041   CREATININE 1.2 (H) 11/08/2016 0915   CALCIUM 9.1 05/02/2022 1041   CALCIUM 10.2 11/08/2016 0915   PROT 7.2 05/02/2022 1041   PROT 6.9 02/21/2021 1148   PROT 7.9 11/08/2016 0915   ALBUMIN 3.9 05/02/2022 1041   ALBUMIN 4.1 02/21/2021 1148   ALBUMIN 3.9 11/08/2016 0915   AST 14 (L) 05/02/2022 1041   AST 24 11/08/2016 0915   ALT 11 05/02/2022 1041   ALT 23 11/08/2016 0915   ALKPHOS 48 05/02/2022 1041   ALKPHOS 68 11/08/2016 0915   BILITOT 0.6 05/02/2022 1041   BILITOT 0.57 11/08/2016 0915   GFRNONAA 58 (L) 05/02/2022 1041   GFRAA 83 10/18/2019 1200     ASSESSMENT & PLAN:72 year old female    1. 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 Grade 2, Ki-67 73%. -Diagnosed in 12/2013. S/p lumpectomy 12/29/2013 and adjuvant radiation 04/05/2014 - 05/20/2014.  -Oncotype DX recurrence score is 23,  intermediate risk; adjuvant chemo was not recommended -She completed adjuvant antiestrogen therapy with Exemestane in 06/2014 - 12/2016 then anastrozole 05/2017 to early 2022. -Ms Daily is clinically doing well from a breast cancer standpoint.  Breast exam is  benign, labs are unremarkable, mammogram 04/25/22 was benign.  Overall no clinical concern for recurrence -Prefers to continue annual follow-up  2.  Unintentional weight loss, abdominal cramping, constipation -Ms. Leviton has lost weight unintentionally since the tragic passing of 2 daughters in law since December. PCP attributes to depression -She got the flu in 02/2202, went to ED for pain/N/V. Imaging showed sigmoid diverticulosis, cholelithiasis without cholecystitis, small pulmonary nodules which were stable from prior, and an ill-defined hypoechoic focus along the gallbladder fossa -On today's exam she is TTP in the mid/low abdomen, without discrete mass or palpable abnormality -Given that her symptoms are ongoing over 2 months I recommend she follow-up with her GI Dr. [facility] Norway, she agrees and will call his office to set up an appointment   3. Bone health -DEXA 04/01/2019 showed lowest T score -0.5, normal  -Continue calcium, vitamin D, and weightbearing exercise  -Repeat in 2024   4. HTN, DM, HL, obesity, low back, R hip/leg pain -Receiving injections -continue f/u with PCP  -I encouraged her to continue healthy active lifestyle and stay up-to-date on age-appropriate health maintenance   PLAN: -Recent ED course, mammogram, and today's labs reviewed -Continue annual surveillance in our clinic -F/up Dr. [facility] Norway for unintentional weight loss and GI symptoms    All questions were answered. The patient knows to call the clinic with any problems, questions or concerns. No barriers to learning were detected. I spent 20 minutes counseling the patient face to face. The total time spent in the appointment was 30 minutes and more than 50%  was on counseling, review of test results, and coordination of care.   Cira Rue, NP-C 05/02/2022

## 2022-05-01 NOTE — Progress Notes (Unsigned)
Patient Care Team: Ladell Pier, MD as PCP - General (Internal Medicine) Boykin Nearing, MD as Consulting Physician (Family Medicine) Erroll Luna, MD as Consulting Physician (General Surgery) Truitt Merle, MD as Consulting Physician (Hematology) Kyung Rudd, MD as Consulting Physician (Radiation Oncology)   CHIEF COMPLAINT:   Oncology History Overview Note  Malignant neoplasm of upper inner quadrant of female breast   Staging form: Breast, AJCC 7th Edition     Clinical: Stage IA (T1c, N0, M0) - Unsigned     Pathologic: No stage assigned - Unsigned     Breast cancer of upper-inner quadrant of left female breast ([place])  11/23/2013 Imaging   Ultrasound shows angulated hypoechoic mass at the left breast 10 o'clock 18 cm from nipple measuring 1.82 x 0.71 x 0.88 cm. Ultrasound of the left axilla is negative.      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    12/29/2013 Surgery   Left lumpectomy with close (<0.1cm) inferior margin   03/02/2014 Surgery   Reexcision for close margin, path negative for malignancy.   04/05/2014 - 05/20/2014 Radiation Therapy   adjuvant breast radiation    05/27/2014 Imaging   Bone density scan: normal    06/07/2014 - 03/2020 Anti-estrogen oral therapy   Exemestane 25mg  daily, started on 06/07/2014. She stopped in Nov 2018 due to high copay. Changed to anastrozole on 05/09/2017. Completed in 03/2020.    06/20/2015 Imaging   Bone scan 06/20/2015 IMPRESSION: No evidence of metastatic disease. Mild increased activity dorsal aspect right midfoot probable degenerative in nature. Clinical correlation is necessary.   02/29/2016 Mammogram   MM DIAG BREAST TOMO BIALTERAL 02/29/16 IMPRESSION: Stable left breast lumpectomy site. No mammographic evidence of malignancy in the bilateral breasts.   03/03/2017 Mammogram   IMPRESSION: 1. No mammographic evidence of malignancy in  either breast. 2. Stable left breast posttreatment changes.      CURRENT THERAPY:   INTERVAL HISTORY   ROS   Past Medical History:  Diagnosis Date   Breast cancer ([place]) 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   Diabetes mellitus ([place]) 09/16/13   Diagnosed on 09/16/13; HgA1C was 7.2/metformin   Dizziness    GERD (gastroesophageal reflux disease)    Headache    Hyperlipidemia    Hypertension 2014   Low back pain    Obesity, morbid, BMI 40.0-49.9 (HCC)    PONV (postoperative nausea and vomiting)    S/P radiation therapy 04/05/14-05/20/14   left breast 60.4Gy totaldose     Past Surgical History:  Procedure Laterality Date   ABDOMINAL HYSTERECTOMY N/A 09/20/2013   Procedure: HYSTERECTOMY ABDOMINAL;  Surgeon: Osborne Oman, MD;  Location: [facility];  Service: Gynecology;  Laterality: N/A;   BREAST LUMPECTOMY Left 2015   radiation   BREAST LUMPECTOMY WITH AXILLARY LYMPH NODE BIOPSY Left 12/29/13   left breast    CATARACT EXTRACTION Right    LAPAROSCOPIC ASSISTED VAGINAL HYSTERECTOMY  09/20/2013   Procedure: LAPAROSCOPIC ASSISTED VAGINAL HYSTERECTOMY;  Surgeon: Osborne Oman, MD;  Location: [facility] [facility];  Service: Gynecology;;  PT WAS EXAMINED WHILE UP IN STIRRUPS AND IT WAS DECIDED TO OPEN PT DUE TO LARGE MASS   LUMBAR LAMINECTOMY/DECOMPRESSION MICRODISCECTOMY Right 12/14/2014   Procedure: Right Lumbar three-four microdiskectomy;  Surgeon: Newman Pies, MD;  Location: [place] [facility];  Service: Neurosurgery;  Laterality: Right;  Right Lumbar three-four microdiskectomy   RADIOACTIVE SEED GUIDED PARTIAL MASTECTOMY WITH AXILLARY SENTINEL  LYMPH NODE BIOPSY Left 12/29/2013   Procedure: LEFT BREAST RADIOACTIVE SEED LOCALIZED LUMPECTOMY WITH SENTINEL LYMPH NODE MAPPING;  Surgeon: Erroll Luna, MD;  Location: [place];  Service: General;  Laterality: Left;   RE-EXCISION OF BREAST LUMPECTOMY Left 03/02/2014   Procedure: RE-EXCISION OF LEFT BREAST LUMPECTOMY;  Surgeon:  Erroll Luna, MD;  Location: [facility];  Service: General;  Laterality: Left;   SALPINGOOPHORECTOMY  09/20/2013   Procedure: SALPINGO OOPHORECTOMY;  Surgeon: Osborne Oman, MD;  Location: [facility];  Service: Gynecology;;     Outpatient Encounter Medications as of 05/01/2022  Medication Sig   albuterol (VENTOLIN HFA) 108 (90 Base) MCG/ACT inhaler Inhale 2 puffs into the lungs every 6 (six) hours as needed for wheezing or shortness of breath.   Ascorbic Acid (VITAMIN C) 1000 MG tablet Take 1,000 mg by mouth daily.   aspirin EC 325 MG tablet Take 325 mg by mouth daily.   atorvastatin (LIPITOR) 20 MG tablet TAKE 1 TABLET BY MOUTH DAILY. STOP PRAVASTATIN   Blood Glucose Monitoring Suppl (ONE TOUCH ULTRA MINI) w/Device KIT USE AS DIRECTED ONCE DAILY DX CODE E11.9   carvedilol (COREG) 6.25 MG tablet TAKE 1.5 TABLETS (9.375 MG TOTAL) BY MOUTH 2 (TWO) TIMES DAILY WITH A MEAL.   cloNIDine (CATAPRES) 0.1 MG tablet TAKE 1 TABLET BY MOUTH 2 TIMES DAILY.   [employer] SENNA PLUS 8.6-50 MG tablet TAKE 2 TABLETS BY MOUTH AT BEDTIME AS NEEDED FOR MILD CONSTIPATION (Patient taking differently: Take 1 tablet by mouth at bedtime.)   diclofenac Sodium (VOLTAREN) 1 % GEL APPLY 2 G TOPICALLY 2 (TWO) TIMES DAILY AS NEEDED.   gabapentin (NEURONTIN) 300 MG capsule TAKE 2 CAPSULES BY MOUTH 2 TIMES DAILY   hydrochlorothiazide (HYDRODIURIL) 25 MG tablet 1 tab PO daily   metFORMIN (GLUCOPHAGE) 500 MG tablet TAKE 1 & 1/2 TABLETS (750 MG) BY MOUTH 2 TIMES DAILY.   Multiple Vitamins-Minerals (MULTIVITAMIN WITH MINERALS) tablet Take 1 tablet by mouth daily. Reported on 03/13/2015   omeprazole (PRILOSEC) 20 MG capsule Take 1 capsule (20 mg total) by mouth 2 (two) times daily before a meal.   ONETOUCH DELICA LANCETS 99991111 MISC USE AS DIRECTED ONCE DAILY DX CODE E11.9   ONETOUCH ULTRA test strip TEST ONCE DAILY AS DIRECTED E11.9   potassium chloride (KLOR-CON) 10 MEQ tablet Take 1 tablet (10 mEq total) by mouth daily.    traMADol (ULTRAM) 50 MG tablet Take 1 tablet (50 mg total) by mouth every 6 (six) hours as needed. Each prescription to last a mth   No facility-administered encounter medications on file as of 05/01/2022.     There were no vitals filed for this visit. There is no height or weight on file to calculate BMI.   PHYSICAL EXAM GENERAL:alert, no distress and comfortable SKIN: no rash  EYES: sclera clear NECK: without mass LYMPH:  no palpable cervical or supraclavicular lymphadenopathy  LUNGS: clear with normal breathing effort HEART: regular rate & rhythm, no lower extremity edema ABDOMEN: abdomen soft, non-tender and normal bowel sounds NEURO: alert & oriented x 3 with fluent speech, no focal motor/sensory deficits Breast exam:  PAC without erythema    CBC    Component Value Date/Time   WBC 10.7 (H) 02/19/2022 2020   RBC 5.27 (H) 02/19/2022 2020   HGB 15.1 (H) 02/19/2022 2020   HGB 12.8 06/25/2021 1154   HGB 14.5 11/08/2016 0915   HCT 44.9 02/19/2022 2020   HCT 39.9 06/25/2021 1154   HCT  44.2 11/08/2016 0915   PLT 343 02/19/2022 2020   PLT 307 06/25/2021 1154   MCV 85.2 02/19/2022 2020   MCV 86 06/25/2021 1154   MCV 85.3 11/08/2016 0915   MCH 28.7 02/19/2022 2020   MCHC 33.6 02/19/2022 2020   RDW 14.2 02/19/2022 2020   RDW 14.3 06/25/2021 1154   RDW 15.1 (H) 11/08/2016 0915   LYMPHSABS 2.8 02/19/2022 2020   LYMPHSABS 1.7 11/08/2016 0915   MONOABS 0.7 02/19/2022 2020   MONOABS 0.7 11/08/2016 0915   EOSABS 0.2 02/19/2022 2020   EOSABS 0.1 11/08/2016 0915   BASOSABS 0.0 02/19/2022 2020   BASOSABS 0.0 11/08/2016 0915     CMP     Component Value Date/Time   NA 143 03/01/2022 1010   NA 143 11/08/2016 0915   K 4.3 03/01/2022 1010   K 3.0 (LL) 11/08/2016 0915   CL 99 03/01/2022 1010   CO2 24 03/01/2022 1010   CO2 30 (H) 11/08/2016 0915   GLUCOSE 102 (H) 03/01/2022 1010   GLUCOSE 129 (H) 02/19/2022 2020   GLUCOSE 114 11/08/2016 0915   BUN 20 03/01/2022 1010   BUN  18.9 11/08/2016 0915   CREATININE 0.93 03/01/2022 1010   CREATININE 1.2 (H) 11/08/2016 0915   CALCIUM 10.2 03/01/2022 1010   CALCIUM 10.2 11/08/2016 0915   PROT 7.9 02/19/2022 2020   PROT 6.9 02/21/2021 1148   PROT 7.9 11/08/2016 0915   ALBUMIN 3.7 02/19/2022 2020   ALBUMIN 4.1 02/21/2021 1148   ALBUMIN 3.9 11/08/2016 0915   AST 31 02/19/2022 2020   AST 24 11/08/2016 0915   ALT 23 02/19/2022 2020   ALT 23 11/08/2016 0915   ALKPHOS 51 02/19/2022 2020   ALKPHOS 68 11/08/2016 0915   BILITOT 0.6 02/19/2022 2020   BILITOT 0.3 02/21/2021 1148   BILITOT 0.57 11/08/2016 0915   GFRNONAA 56 (L) 02/19/2022 2020   GFRAA 83 10/18/2019 1200     ASSESSMENT & PLAN:  PLAN:  No orders of the defined types were placed in this encounter.     All questions were answered. The patient knows to call the clinic with any problems, questions or concerns. No barriers to learning were detected. I spent *** counseling the patient face to face. The total time spent in the appointment was *** and more than 50% was on counseling, review of test results, and coordination of care.   Cira Rue, NP-C @DATE @

## 2022-05-02 ENCOUNTER — Inpatient Hospital Stay: Payer: Medicare Other | Admitting: Nurse Practitioner

## 2022-05-02 ENCOUNTER — Inpatient Hospital Stay: Payer: Medicare Other | Attending: Nurse Practitioner

## 2022-05-02 ENCOUNTER — Encounter: Payer: Self-pay | Admitting: Nurse Practitioner

## 2022-05-02 ENCOUNTER — Other Ambulatory Visit: Payer: Self-pay

## 2022-05-02 VITALS — BP 130/67 | HR 73 | Temp 98.4°F | Resp 16 | Wt 178.5 lb

## 2022-05-02 DIAGNOSIS — C50212 Malignant neoplasm of upper-inner quadrant of left female breast: Secondary | ICD-10-CM

## 2022-05-02 DIAGNOSIS — Z90721 Acquired absence of ovaries, unilateral: Secondary | ICD-10-CM | POA: Diagnosis not present

## 2022-05-02 DIAGNOSIS — G8929 Other chronic pain: Secondary | ICD-10-CM | POA: Insufficient documentation

## 2022-05-02 DIAGNOSIS — K5900 Constipation, unspecified: Secondary | ICD-10-CM | POA: Insufficient documentation

## 2022-05-02 DIAGNOSIS — K5730 Diverticulosis of large intestine without perforation or abscess without bleeding: Secondary | ICD-10-CM | POA: Diagnosis not present

## 2022-05-02 DIAGNOSIS — R112 Nausea with vomiting, unspecified: Secondary | ICD-10-CM | POA: Diagnosis not present

## 2022-05-02 DIAGNOSIS — Z170 Estrogen receptor positive status [ER+]: Secondary | ICD-10-CM | POA: Diagnosis not present

## 2022-05-02 DIAGNOSIS — Z90710 Acquired absence of both cervix and uterus: Secondary | ICD-10-CM | POA: Diagnosis not present

## 2022-05-02 DIAGNOSIS — R109 Unspecified abdominal pain: Secondary | ICD-10-CM | POA: Insufficient documentation

## 2022-05-02 DIAGNOSIS — Z79899 Other long term (current) drug therapy: Secondary | ICD-10-CM | POA: Insufficient documentation

## 2022-05-02 DIAGNOSIS — F32A Depression, unspecified: Secondary | ICD-10-CM | POA: Diagnosis not present

## 2022-05-02 DIAGNOSIS — Z923 Personal history of irradiation: Secondary | ICD-10-CM | POA: Diagnosis not present

## 2022-05-02 DIAGNOSIS — E785 Hyperlipidemia, unspecified: Secondary | ICD-10-CM | POA: Insufficient documentation

## 2022-05-02 DIAGNOSIS — Z79811 Long term (current) use of aromatase inhibitors: Secondary | ICD-10-CM | POA: Insufficient documentation

## 2022-05-02 DIAGNOSIS — E119 Type 2 diabetes mellitus without complications: Secondary | ICD-10-CM | POA: Diagnosis not present

## 2022-05-02 DIAGNOSIS — I10 Essential (primary) hypertension: Secondary | ICD-10-CM | POA: Insufficient documentation

## 2022-05-02 DIAGNOSIS — E669 Obesity, unspecified: Secondary | ICD-10-CM | POA: Diagnosis not present

## 2022-05-02 DIAGNOSIS — R634 Abnormal weight loss: Secondary | ICD-10-CM | POA: Diagnosis not present

## 2022-05-02 LAB — CBC WITH DIFFERENTIAL (CANCER CENTER ONLY)
Abs Immature Granulocytes: 0.02 10*3/uL (ref 0.00–0.07)
Basophils Absolute: 0.1 10*3/uL (ref 0.0–0.1)
Basophils Relative: 1 %
Eosinophils Absolute: 0.3 10*3/uL (ref 0.0–0.5)
Eosinophils Relative: 3 %
HCT: 40.9 % (ref 36.0–46.0)
Hemoglobin: 13.1 g/dL (ref 12.0–15.0)
Immature Granulocytes: 0 %
Lymphocytes Relative: 26 %
Lymphs Abs: 2.4 10*3/uL (ref 0.7–4.0)
MCH: 28.5 pg (ref 26.0–34.0)
MCHC: 32 g/dL (ref 30.0–36.0)
MCV: 88.9 fL (ref 80.0–100.0)
Monocytes Absolute: 0.6 10*3/uL (ref 0.1–1.0)
Monocytes Relative: 6 %
Neutro Abs: 5.8 10*3/uL (ref 1.7–7.7)
Neutrophils Relative %: 64 %
Platelet Count: 282 10*3/uL (ref 150–400)
RBC: 4.6 MIL/uL (ref 3.87–5.11)
RDW: 14.2 % (ref 11.5–15.5)
WBC Count: 9.1 10*3/uL (ref 4.0–10.5)
nRBC: 0 % (ref 0.0–0.2)

## 2022-05-02 LAB — CMP (CANCER CENTER ONLY)
ALT: 11 U/L (ref 0–44)
AST: 14 U/L — ABNORMAL LOW (ref 15–41)
Albumin: 3.9 g/dL (ref 3.5–5.0)
Alkaline Phosphatase: 48 U/L (ref 38–126)
Anion gap: 10 (ref 5–15)
BUN: 19 mg/dL (ref 8–23)
CO2: 31 mmol/L (ref 22–32)
Calcium: 9.1 mg/dL (ref 8.9–10.3)
Chloride: 101 mmol/L (ref 98–111)
Creatinine: 1.03 mg/dL — ABNORMAL HIGH (ref 0.44–1.00)
GFR, Estimated: 58 mL/min — ABNORMAL LOW (ref >60)
Glucose, Bld: 101 mg/dL — ABNORMAL HIGH (ref 70–99)
Potassium: 3.6 mmol/L (ref 3.5–5.1)
Sodium: 142 mmol/L (ref 135–145)
Total Bilirubin: 0.6 mg/dL (ref 0.3–1.2)
Total Protein: 7.2 g/dL (ref 6.5–8.1)

## 2022-05-15 ENCOUNTER — Other Ambulatory Visit: Payer: Self-pay | Admitting: Internal Medicine

## 2022-05-15 DIAGNOSIS — I10 Essential (primary) hypertension: Secondary | ICD-10-CM

## 2022-05-15 NOTE — Telephone Encounter (Signed)
Requested Prescriptions  Pending Prescriptions Disp Refills   cloNIDine (CATAPRES) 0.1 MG tablet [Pharmacy Med Name: CLONIDINE HCL 0.1 MG TABLET] 180 tablet 0    Sig: TAKE 1 TABLET BY MOUTH TWICE A DAY     Cardiovascular:  Alpha-2 Agonists Passed - 05/15/2022  2:28 AM      Passed - Last BP in normal range    BP Readings from Last 1 Encounters:  05/02/22 130/67         Passed - Last Heart Rate in normal range    Pulse Readings from Last 1 Encounters:  05/02/22 73         Passed - Valid encounter within last 6 months    Recent Outpatient Visits           2 months ago Type 2 diabetes mellitus with obesity (HCC)   [place] [facility] Marcine Matar, MD   6 months ago Encounter for [employer] annual wellness exam   [place] [facility] Jonah Blue B, MD   10 months ago Type 2 diabetes mellitus with obesity [facility])   [place] [facility] & [facility] Marcine Matar, MD   11 months ago 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

## 2022-06-11 ENCOUNTER — Other Ambulatory Visit: Payer: Self-pay | Admitting: Internal Medicine

## 2022-06-11 DIAGNOSIS — G8929 Other chronic pain: Secondary | ICD-10-CM

## 2022-06-11 NOTE — Telephone Encounter (Signed)
Requested medication (s) are due for refill today: yes  Requested medication (s) are on the active medication list: yes  Last refill:  03/01/22 #120 1 RF  Future visit scheduled: yes  Notes to clinic:  med not delegated to NT to RF   Requested Prescriptions  Pending Prescriptions Disp Refills   traMADol (ULTRAM) 50 MG tablet [Pharmacy Med Name: TRAMADOL HCL 50 MG TABLET] 120 tablet 1    Sig: TAKE 1 TAB EVERY 4 (FOUR) HOURS AS NEEDED (EACH PRESCRIPTION TO LAST 1 MONTH).     Not Delegated - Analgesics:  Opioid Agonists Failed - 06/11/2022 10:42 AM      Failed - This refill cannot be delegated      Failed - Urine Drug Screen completed in last 360 days      Failed - Valid encounter within last 3 months    Recent Outpatient Visits           3 months ago Type 2 diabetes mellitus with obesity (HCC)   [place] [facility] Marcine Matar, MD   7 months ago Encounter for [employer] annual wellness exam   [place] [facility] Jonah Blue B, MD   11 months ago Type 2 diabetes mellitus with obesity [facility])   [place] [facility] Marcine Matar, MD   1 year ago 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

## 2022-07-03 ENCOUNTER — Encounter: Payer: Self-pay | Admitting: Physician Assistant

## 2022-07-03 ENCOUNTER — Ambulatory Visit: Payer: Medicare Other | Attending: Physician Assistant | Admitting: Physician Assistant

## 2022-07-03 VITALS — BP 111/78 | HR 63 | Wt 177.2 lb

## 2022-07-03 DIAGNOSIS — E1169 Type 2 diabetes mellitus with other specified complication: Secondary | ICD-10-CM | POA: Diagnosis not present

## 2022-07-03 DIAGNOSIS — R053 Chronic cough: Secondary | ICD-10-CM

## 2022-07-03 DIAGNOSIS — Z7984 Long term (current) use of oral hypoglycemic drugs: Secondary | ICD-10-CM | POA: Diagnosis not present

## 2022-07-03 DIAGNOSIS — M1611 Unilateral primary osteoarthritis, right hip: Secondary | ICD-10-CM

## 2022-07-03 DIAGNOSIS — G8929 Other chronic pain: Secondary | ICD-10-CM | POA: Diagnosis not present

## 2022-07-03 DIAGNOSIS — E1159 Type 2 diabetes mellitus with other circulatory complications: Secondary | ICD-10-CM | POA: Diagnosis not present

## 2022-07-03 DIAGNOSIS — E785 Hyperlipidemia, unspecified: Secondary | ICD-10-CM | POA: Diagnosis not present

## 2022-07-03 DIAGNOSIS — I10 Essential (primary) hypertension: Secondary | ICD-10-CM

## 2022-07-03 DIAGNOSIS — E669 Obesity, unspecified: Secondary | ICD-10-CM

## 2022-07-03 DIAGNOSIS — M5416 Radiculopathy, lumbar region: Secondary | ICD-10-CM

## 2022-07-03 DIAGNOSIS — I83891 Varicose veins of right lower extremities with other complications: Secondary | ICD-10-CM | POA: Diagnosis not present

## 2022-07-03 DIAGNOSIS — E876 Hypokalemia: Secondary | ICD-10-CM

## 2022-07-03 DIAGNOSIS — I152 Hypertension secondary to endocrine disorders: Secondary | ICD-10-CM

## 2022-07-03 MED ORDER — ATORVASTATIN CALCIUM 20 MG PO TABS: ORAL_TABLET | ORAL | 1 refills | Status: DC

## 2022-07-03 MED ORDER — POTASSIUM CHLORIDE ER 10 MEQ PO TBCR: 10.0000 meq | EXTENDED_RELEASE_TABLET | Freq: Every day | ORAL | 1 refills | Status: DC

## 2022-07-03 MED ORDER — CLONIDINE HCL 0.1 MG PO TABS: 0.1000 mg | ORAL_TABLET | Freq: Two times a day (BID) | ORAL | 0 refills | Status: DC

## 2022-07-03 MED ORDER — TRAMADOL HCL 50 MG PO TABS: ORAL_TABLET | ORAL | 1 refills | Status: DC

## 2022-07-03 MED ORDER — HYDROCHLOROTHIAZIDE 25 MG PO TABS: ORAL_TABLET | ORAL | 1 refills | Status: DC

## 2022-07-03 MED ORDER — METFORMIN HCL 500 MG PO TABS: ORAL_TABLET | ORAL | 2 refills | Status: DC

## 2022-07-03 MED ORDER — ALBUTEROL SULFATE HFA 108 (90 BASE) MCG/ACT IN AERS: 2.0000 | INHALATION_SPRAY | Freq: Four times a day (QID) | RESPIRATORY_TRACT | 3 refills | Status: AC | PRN

## 2022-07-03 MED ORDER — GABAPENTIN 300 MG PO CAPS: ORAL_CAPSULE | ORAL | 1 refills | Status: DC

## 2022-07-03 MED ORDER — CARVEDILOL 6.25 MG PO TABS: 9.3750 mg | ORAL_TABLET | Freq: Two times a day (BID) | ORAL | 0 refills | Status: DC

## 2022-07-03 NOTE — Progress Notes (Signed)
Patient ID: Rachel Herman, female   DOB: [birth year],   Alece Graeter, is a 72 y.o. female  ZOX:[id number]  [id number]  DOB - 10/12/1950  Chief Complaint  Patient presents with   Diabetes   Medication Refill       Subjective:   Jannie Eccleston is a 72 y.o. female here today for med RF and referrals.  Has pain mngmnt contract with Dr Laural Benes for tramadol.  She has been told she needs R hip replacement for osteoarthritis. It has been more than a year since she says she has seen ortho and is requesting referral.  She is alos c/o pain related to varicose veins in her R leg and wants referral for that as well  No problems updated.  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 Authorizing Provider  Ascorbic Acid (VITAMIN C) 1000 MG tablet Take 1,000 mg by mouth daily.   Yes [provider]  aspirin EC 325 MG tablet Take 325 mg by mouth daily.   Yes [provider]  Blood Glucose Monitoring Suppl (ONE TOUCH ULTRA MINI) w/Device KIT USE AS DIRECTED ONCE DAILY DX CODE E11.9 11/10/17  Yes Marcine Matar, MD  [facility] SENNA PLUS 8.6-50 MG tablet TAKE 2 TABLETS BY MOUTH AT BEDTIME AS NEEDED FOR MILD CONSTIPATION Patient taking differently: Take 1 tablet by mouth at bedtime. 07/11/16  Yes Funches, Josalyn, MD  diclofenac Sodium (VOLTAREN) 1 % GEL APPLY 2 G TOPICALLY 2 (TWO) TIMES DAILY AS  NEEDED. 12/26/21  Yes Marcine Matar, MD  Multiple Vitamins-Minerals (MULTIVITAMIN WITH MINERALS) tablet Take 1 tablet by mouth daily. Reported on 03/13/2015   Yes [provider]  omeprazole (PRILOSEC) 20 MG capsule Take 1 capsule (20 mg total) by mouth 2 (two) times daily before a meal. 03/01/22  Yes Marcine Matar, MD  [facility] DELICA LANCETS 33G MISC USE AS DIRECTED ONCE DAILY DX CODE E11.9 11/10/17  Yes Marcine Matar, MD  [facility] ULTRA test strip TEST ONCE DAILY AS DIRECTED E11.9 02/18/21  Yes Marcine Matar, MD  albuterol (VENTOLIN HFA) 108 (90 Base) MCG/ACT inhaler Inhale 2 puffs into the lungs every 6 (six) hours as needed for wheezing or shortness of breath. 07/03/22   Anders Simmonds, PA-C  atorvastatin (LIPITOR) 20 MG tablet TAKE 1 TABLET BY MOUTH DAILY. STOP PRAVASTATIN 07/03/22   Anders Simmonds, PA-C  carvedilol (COREG) 6.25 MG tablet Take 1.5 tablets (9.375 mg total) by mouth 2 (two) times daily with a meal. 07/03/22   Idora Brosious, Marzella Schlein, PA-C  cloNIDine (CATAPRES) 0.1 MG tablet Take 1 tablet (0.1 mg total) by mouth 2 (two) times daily. 07/03/22   Anders Simmonds, PA-C  gabapentin (NEURONTIN) 300 MG capsule TAKE 2 CAPSULES BY MOUTH 2 TIMES DAILY 07/03/22   Georgian Co M, PA-C  hydrochlorothiazide (HYDRODIURIL) 25 MG tablet 1 tab PO  daily 07/03/22   Anders Simmonds, PA-C  metFORMIN (GLUCOPHAGE) 500 MG tablet TAKE 1 & 1/2 TABLETS (750 MG) BY MOUTH 2 TIMES DAILY. 07/03/22   Anders Simmonds, PA-C  potassium chloride (KLOR-CON) 10 MEQ tablet Take 1 tablet (10 mEq total) by mouth daily. 07/03/22   Anders Simmonds, PA-C  traMADol (ULTRAM) 50 MG tablet TAKE 1 TAB EVERY 4 (FOUR) HOURS AS NEEDED (EACH PRESCRIPTION TO LAST 1 MONTH). 07/03/22   Payden Docter, Marzella Schlein, PA-C    ROS: Neg HEENT Neg resp Neg cardiac Neg GI Neg GU Neg psych Neg neuro  Objective:   Vitals:   07/03/22 0845  BP: 111/78  Pulse: 63  SpO2: 99%  Weight: 177 lb 3.2 oz (80.4 kg)    Exam General appearance : Awake, alert, not in any distress. Speech Clear. Not toxic looking HEENT: Atraumatic and Normocephalic, poor dentition, teeth missing Neck: Supple, no JVD. No cervical lymphadenopathy.  Chest: Good air entry bilaterally, CTAB.  No rales/rhonchi/wheezing CVS: S1 S2 regular, no murmurs.  Extremities: B/L Lower Ext shows no edema, both legs are warm to touch;  B legs with superficial varicosities-R>L Neurology: Awake alert, and oriented X 3, CN II-XII intact, Non focal Skin: No Rash  Data Review Lab Results  Component Value Date   HGBA1C 6.1 03/01/2022   HGBA1C 6.3 06/25/2021   HGBA1C 6.0 (H) 02/21/2021    Assessment & Plan   1. Type 2 diabetes mellitus with obesity (HCC) - metFORMIN (GLUCOPHAGE) 500 MG tablet; TAKE 1 & 1/2 TABLETS (750 MG) BY MOUTH 2 TIMES DAILY.  Dispense: 270 tablet; Refill: 2 - Hemoglobin A1c - Lipid panel  2. Hyperlipidemia associated with type 2 diabetes mellitus (HCC) - atorvastatin (LIPITOR) 20 MG tablet; TAKE 1 TABLET BY MOUTH DAILY. STOP PRAVASTATIN  Dispense: 90 tablet; Refill: 1 - Hemoglobin A1c - Lipid panel  3. Radiculopathy, lumbar region - gabapentin (NEURONTIN) 300 MG capsule; TAKE 2 CAPSULES BY MOUTH 2 TIMES DAILY  Dispense: 360 capsule; Refill: 1  4. Hypertension associated with diabetes (HCC) - hydrochlorothiazide (HYDRODIURIL) 25 MG tablet; 1 tab PO daily  Dispense: 90 tablet; Refill: 1  5. Hypokalemia - potassium chloride (KLOR-CON) 10 MEQ tablet; Take 1 tablet (10 mEq total) by mouth daily.  Dispense: 90 tablet; Refill: 1  6. Chronic radicular pain of lower back - traMADol (ULTRAM) 50 MG tablet; TAKE 1 TAB EVERY 4 (FOUR) HOURS AS NEEDED (EACH PRESCRIPTION TO LAST 1 MONTH).  Dispense: 120 tablet; Refill: 1  7. Essential hypertension - carvedilol (COREG) 6.25 MG tablet; Take 1.5 tablets (9.375 mg total) by mouth 2 (two) times daily with a meal.  Dispense: 270 tablet; Refill: 0 - cloNIDine (CATAPRES) 0.1 MG  tablet; Take 1 tablet (0.1 mg total) by mouth 2 (two) times daily.  Dispense: 180 tablet; Refill: 0  8. Chronic cough - albuterol (VENTOLIN HFA) 108 (90 Base) MCG/ACT inhaler; Inhale 2 puffs into the lungs every 6 (six) hours as needed for wheezing or shortness of breath.  Dispense: 8 g; Refill: 3  9. Primary osteoarthritis of right hip - Ambulatory referral to Orthopedic Surgery  10. Symptomatic varicose veins, right - Ambulatory referral to Vascular Surgery    Return in about 4 months (around 11/03/2022) for PCP for chronic conditions.  The patient was given clear instructions to go to ER or return to medical center if symptoms don't improve, worsen or new problems develop. The patient verbalized understanding. The patient was told to call to get lab results if  they haven't heard anything in the next week.      Georgian Co, PA-C [facility] [place], [place] [phone]   07/03/2022, 8:59 AM   MRN: [id number]

## 2022-07-04 LAB — LIPID PANEL
Chol/HDL Ratio: 3.4 ratio (ref 0.0–4.4)
Cholesterol, Total: 135 mg/dL (ref 100–199)
HDL: 40 mg/dL (ref >39)
LDL Chol Calc (NIH): 57 mg/dL (ref 0–99)
Triglycerides: 236 mg/dL — ABNORMAL HIGH (ref 0–149)
VLDL Cholesterol Cal: 38 mg/dL (ref 5–40)

## 2022-07-04 LAB — HEMOGLOBIN A1C
Est. average glucose Bld gHb Est-mCnc: 131 mg/dL
Hgb A1c MFr Bld: 6.2 % — ABNORMAL HIGH (ref 4.8–5.6)

## 2022-07-09 ENCOUNTER — Telehealth: Payer: Self-pay

## 2022-07-09 NOTE — Telephone Encounter (Signed)
Pt given lab results per notes of A. McClung PA on 07/09/22. Pt verbalized understanding.

## 2022-07-11 ENCOUNTER — Ambulatory Visit: Payer: Medicare Other | Admitting: Orthopedic Surgery

## 2022-07-11 ENCOUNTER — Encounter: Payer: Self-pay | Admitting: Orthopedic Surgery

## 2022-07-11 ENCOUNTER — Other Ambulatory Visit (INDEPENDENT_AMBULATORY_CARE_PROVIDER_SITE_OTHER): Payer: Medicare Other

## 2022-07-11 ENCOUNTER — Other Ambulatory Visit: Payer: Self-pay

## 2022-07-11 DIAGNOSIS — M25551 Pain in right hip: Secondary | ICD-10-CM | POA: Diagnosis not present

## 2022-07-11 DIAGNOSIS — M1611 Unilateral primary osteoarthritis, right hip: Secondary | ICD-10-CM | POA: Diagnosis not present

## 2022-07-11 MED ORDER — LIDOCAINE HCL 1 % IJ SOLN
5.0000 mL | INTRAMUSCULAR | Status: AC | PRN
  Administered 2022-07-11: 5 mL

## 2022-07-11 MED ORDER — BUPIVACAINE HCL 0.25 % IJ SOLN
4.0000 mL | INTRAMUSCULAR | Status: AC | PRN
  Administered 2022-07-11: 4 mL via INTRA_ARTICULAR

## 2022-07-11 MED ORDER — METHYLPREDNISOLONE ACETATE 80 MG/ML IJ SUSP
80.0000 mg | INTRAMUSCULAR | Status: AC | PRN
  Administered 2022-07-11: 80 mg via INTRA_ARTICULAR

## 2022-07-11 NOTE — Progress Notes (Signed)
Office Visit Note   Patient: Rachel Herman           Date of Birth: 09/24/1950           MRN: [id number] Visit Date: 07/11/2022 Requested by: Anders Simmonds, PA-C [address] [place] [address] [place],  [place] [address] PCP: Marcine Matar, MD  Subjective: Chief Complaint  Patient presents with   Right Hip - Pain    HPI: Rachel Herman is a 72 y.o. female who presents to the office reporting right hip pain of 6 months duration worse over the past several months.  She is using a cane.  Reports constant pain with range of motion.  She states she is not sleeping well due to the pain.  Hurts her to walk.  She last had an injection in the joint over a year ago.  Tramadol has not been helpful.  Aleve not helpful.  She does have a son at home.  Her walking endurance is around 40 to 50 feet.  She states that most of her teeth are broken.  Cannot afford to go to the dentist..                ROS: All systems reviewed are negative as they relate to the chief complaint within the history of present illness.  Patient denies fevers or chills.  Assessment & Plan: Visit Diagnoses:  1. Pain in right hip     Plan: Impression is severe end-stage right hip arthritis.  I think she would be a good candidate for total hip replacement but there is the issue of her broken teeth.  I think that really needs to be addressed by dentist prior to any consideration of elective joint replacement.  She requests right hip injection today which is done under ultrasound guidance.  Follow-up after dental evaluation.  Follow-Up Instructions: No follow-ups on file.   Orders:  Orders Placed This Encounter  Procedures   XR HIP UNILAT W OR W/O PELVIS 2-3 VIEWS RIGHT   US Guided Needle Placement - No Linked Charges   No orders of the defined types were placed in this encounter.     Procedures: Large Joint Inj: R hip joint on 07/11/2022 8:11 PM Indications: pain and diagnostic evaluation Details: 18 G 3.5 in  needle, ultrasound-guided lateral approach  Arthrogram: No  Medications: 5 mL lidocaine 1 %; 80 mg methylPREDNISolone acetate 80 MG/ML; 4 mL bupivacaine 0.25 % Outcome: tolerated well, no immediate complications Procedure, treatment alternatives, risks and benefits explained, specific risks discussed. Consent was given by the patient. Immediately prior to procedure a time out was called to verify the correct patient, procedure, equipment, support staff and site/side marked as required. Patient was prepped and draped in the usual sterile fashion.       Clinical Data: No additional findings.  Objective: Vital Signs: There were no vitals taken for this visit.  Physical Exam:  Constitutional: Patient appears well-developed HEENT:  Head: Normocephalic Eyes:EOM are normal Neck: Normal range of motion Cardiovascular: Normal rate Pulmonary/chest: Effort normal Neurologic: Patient is alert Skin: Skin is warm Psychiatric: Patient has normal mood and affect  Ortho Exam: Ortho exam demonstrates diminished range of motion on the right-hand side with internal extra rotation of the hip.  Left hip is less painful with range of motion.  Hip flexion abduction adduction strength is intact.  Pedal pulses intact.  No effusion in either knee.  Antalgic Trendelenburg type gait to the right.  Leg lengths  equal.  Specialty Comments:  No specialty comments available.  Imaging: No results found.   PMFS History: Patient Active Problem List   Diagnosis Date Noted   Stage 3a chronic kidney disease (HCC) 03/01/2022   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   Pneumococcal vaccination declined 10/18/2019   Chronic cough 08/03/2018   Other intervertebral disc degeneration, lumbar region 03/18/2018   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/21/2016   Lumbar pain with radiation down left and right leg 07/02/2016   Thyroid nodule 06/09/2015    Nodule of right lung 07/28/2014   Diverticulosis of colon without hemorrhage 07/28/2014   DJD (degenerative joint disease), lumbar 02/22/2014   Lumbar pain with radiation down left leg 01/17/2014   Breast cancer of upper-inner quadrant of left female breast (HCC) 01/05/2014   Hyperlipidemia associated with type 2 diabetes mellitus (HCC) 09/28/2013   S/P total hysterectomy and bilateral salpingo-oophorectomy 09/20/2013   Essential hypertension    Diabetes mellitus (HCC) 09/16/2013   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   Diabetes mellitus (HCC) 09/16/13   Diagnosed on 09/16/13; HgA1C was 7.2/metformin   Dizziness    GERD (gastroesophageal reflux disease)    Headache    Hyperlipidemia    Hypertension 2014   Low back pain    Obesity, morbid, BMI 40.0-49.9 (HCC)    PONV (postoperative nausea and vomiting)    S/P radiation therapy 04/05/14-05/20/14   left breast 60.4Gy totaldose    Family History  Problem Relation Age of Onset   Diabetes Mother    Multiple myeloma Mother 2   Hearing loss Mother    Diabetes Brother    Cancer Brother 40       prostate cancer    Diabetes Maternal Aunt    Leukemia Maternal Aunt        dx in her 34s   Alcoholism Brother    Heart attack Father    Diabetes Sister    Diabetes Son     Past Surgical History:  Procedure Laterality Date   ABDOMINAL HYSTERECTOMY N/A 09/20/2013   Procedure: HYSTERECTOMY ABDOMINAL;  Surgeon: Tereso Newcomer, MD;  Location: [facility];  Service: Gynecology;  Laterality: N/A;   BREAST LUMPECTOMY Left 2015   radiation   BREAST LUMPECTOMY WITH AXILLARY LYMPH NODE BIOPSY Left 12/29/13   left breast    CATARACT EXTRACTION Right    LAPAROSCOPIC ASSISTED VAGINAL HYSTERECTOMY  09/20/2013   Procedure: LAPAROSCOPIC ASSISTED VAGINAL HYSTERECTOMY;  Surgeon: Tereso Newcomer, MD;  Location: [facility];  Service: Gynecology;;  PT WAS EXAMINED WHILE UP IN [place] AND IT WAS  DECIDED TO OPEN PT DUE TO LARGE MASS   LUMBAR LAMINECTOMY/DECOMPRESSION MICRODISCECTOMY Right 12/14/2014   Procedure: Right Lumbar three-four microdiskectomy;  Surgeon: Tressie Stalker, MD;  Location: [facility];  Service: Neurosurgery;  Laterality: Right;  Right Lumbar three-four microdiskectomy   RADIOACTIVE SEED GUIDED PARTIAL MASTECTOMY WITH AXILLARY SENTINEL LYMPH NODE BIOPSY Left 12/29/2013   Procedure: LEFT BREAST RADIOACTIVE SEED LOCALIZED LUMPECTOMY WITH SENTINEL LYMPH NODE MAPPING;  Surgeon: Harriette Bouillon, MD;  Location: [facility];  Service: General;  Laterality: Left;   RE-EXCISION OF BREAST LUMPECTOMY Left 03/02/2014   Procedure: RE-EXCISION OF LEFT BREAST LUMPECTOMY;  Surgeon: Harriette Bouillon, MD;  Location: [facility];  Service: General;  Laterality: Left;   SALPINGOOPHORECTOMY  09/20/2013   Procedure: SALPINGO OOPHORECTOMY;  Surgeon: Jethro Bastos  Anyanwu, MD;  Location: [facility];  Service: Gynecology;;   Social History   Occupational History    Employer: UNEMPLOYED  Tobacco Use   Smoking status: Never   Smokeless tobacco: Never  Vaping Use   Vaping Use: Never used  Substance and Sexual Activity   Alcohol use: No   Drug use: No   Sexual activity: Not Currently    Birth control/protection: Post-menopausal

## 2022-07-15 ENCOUNTER — Other Ambulatory Visit: Payer: Self-pay | Admitting: Internal Medicine

## 2022-07-15 DIAGNOSIS — K219 Gastro-esophageal reflux disease without esophagitis: Secondary | ICD-10-CM

## 2022-08-14 ENCOUNTER — Other Ambulatory Visit: Payer: Self-pay | Admitting: Internal Medicine

## 2022-08-14 DIAGNOSIS — I10 Essential (primary) hypertension: Secondary | ICD-10-CM

## 2022-08-14 DIAGNOSIS — G8929 Other chronic pain: Secondary | ICD-10-CM

## 2022-08-14 NOTE — Telephone Encounter (Signed)
Medication Refill - Medication: traMADol (ULTRAM) 50 MG tablet (pt states that she will be taking her last pill today)  carvedilol (COREG) 6.25 MG tablet   Has the patient contacted their pharmacy? Yes.     Preferred Pharmacy (with phone number or street name): [id number] [place], [place] - [address] Phone: [phone] Fax: [phone]  Has the patient been seen for an appointment in the last year OR does the patient have an upcoming appointment? Yes.    Agent: Please be advised that RX refills may take up to 3 business days. We ask that you follow-up with your pharmacy.

## 2022-08-15 MED ORDER — TRAMADOL HCL 50 MG PO TABS: ORAL_TABLET | ORAL | 1 refills | Status: DC

## 2022-08-15 NOTE — Telephone Encounter (Signed)
Requested medications are due for refill today.  unsure  Requested medications are on the active medications list.  yes  Last refill. 07/03/2022 #120 3 rf  Future visit scheduled.   yes  Notes to clinic.  Refill not delegated.    Requested Prescriptions  Pending Prescriptions Disp Refills   traMADol (ULTRAM) 50 MG tablet 120 tablet 1    Sig: TAKE 1 TAB EVERY 4 (FOUR) HOURS AS NEEDED (EACH PRESCRIPTION TO LAST 1 MONTH).     Not Delegated - Analgesics:  Opioid Agonists Failed - 08/14/2022 11:34 AM      Failed - This refill cannot be delegated      Failed - Urine Drug Screen completed in last 360 days      Passed - Valid encounter within last 3 months    Recent Outpatient Visits           1 month ago Primary osteoarthritis of right hip   [place] [facility] [place], [place], [place]   5 months ago Type 2 diabetes mellitus with obesity [facility])   [place] [facility] Jonah Blue B, MD   9 months ago Encounter for [employer] annual wellness exam   [place] [facility] Jonah Blue B, MD   1 year ago Type 2 diabetes mellitus with obesity [facility])   [place] [facility] Jonah Blue B, MD   1 year ago Moderate major depression [facility])   [place] [facility] Marcine Matar, MD       Future Appointments             In 2 months Marcine Matar, MD [facility]            Refused Prescriptions Disp Refills   carvedilol (COREG) 6.25 MG tablet 270 tablet 0    Sig: Take 1.5 tablets (9.375 mg total) by mouth 2 (two) times daily with a meal.     Cardiovascular: Beta Blockers 3 Failed - 08/14/2022 11:34 AM      Failed - Cr in normal range and within 360 days    Creatinine  Date Value Ref Range Status  05/02/2022 1.03 (H) 0.44 - 1.00 mg/dL Final  16/11/9602 54.0 20.0 - 300.0 mg/dL Final  98/12/9145 1.2 (H)  0.6 - 1.1 mg/dL Final   Creatinine, POC  Date Value Ref Range Status  07/02/2016 100mg  mg/dL Final         Failed - AST in normal range and within 360 days    AST  Date Value Ref Range Status  05/02/2022 14 (L) 15 - 41 U/L Final  11/08/2016 24 5 - 34 U/L Final         Passed - ALT in normal range and within 360 days    ALT  Date Value Ref Range Status  05/02/2022 11 0 - 44 U/L Final  11/08/2016 23 0 - 55 U/L Final         Passed - Last BP in normal range    BP Readings from Last 1 Encounters:  07/03/22 111/78         Passed - Last Heart Rate in normal range    Pulse Readings from Last 1 Encounters:  07/03/22 63         Passed - Valid encounter within last 6 months    Recent Outpatient Visits  1 month ago Primary osteoarthritis of right hip   [place] [facility] [place], [place], [place]   5 months ago Type 2 diabetes mellitus with obesity [facility])   [place] [facility] Marcine Matar, MD   9 months ago Encounter for [employer] annual wellness exam   [place] [facility] Jonah Blue B, MD   1 year ago Type 2 diabetes mellitus with obesity [facility])   [facility] & [facility] Marcine Matar, MD   1 year ago Moderate major depression [place])   [place] [facility] Marcine Matar, MD       Future Appointments             In 2 months Laural Benes, Binnie Rail, MD [facility]

## 2022-08-15 NOTE — Telephone Encounter (Signed)
Requested Prescriptions  Pending Prescriptions Disp Refills   carvedilol (COREG) 6.25 MG tablet 270 tablet 0    Sig: Take 1.5 tablets (9.375 mg total) by mouth 2 (two) times daily with a meal.     Cardiovascular: Beta Blockers 3 Failed - 08/14/2022 11:34 AM      Failed - Cr in normal range and within 360 days    Creatinine  Date Value Ref Range Status  05/02/2022 1.03 (H) 0.44 - 1.00 mg/dL Final  16/11/9602 54.0 20.0 - 300.0 mg/dL Final  98/12/9145 1.2 (H) 0.6 - 1.1 mg/dL Final   Creatinine, POC  Date Value Ref Range Status  07/02/2016 100mg  mg/dL Final         Failed - AST in normal range and within 360 days    AST  Date Value Ref Range Status  05/02/2022 14 (L) 15 - 41 U/L Final  11/08/2016 24 5 - 34 U/L Final         Passed - ALT in normal range and within 360 days    ALT  Date Value Ref Range Status  05/02/2022 11 0 - 44 U/L Final  11/08/2016 23 0 - 55 U/L Final         Passed - Last BP in normal range    BP Readings from Last 1 Encounters:  07/03/22 111/78         Passed - Last Heart Rate in normal range    Pulse Readings from Last 1 Encounters:  07/03/22 63         Passed - Valid encounter within last 6 months    Recent Outpatient Visits           1 month ago Primary osteoarthritis of right hip   [place] [facility] [place], [place], [place]   5 months ago Type 2 diabetes mellitus with obesity [facility])   [place] [facility] Marcine Matar, MD   9 months ago Encounter for [employer] annual wellness exam   [place] [facility] Jonah Blue B, MD   1 year ago Type 2 diabetes mellitus with obesity [facility])   [place] [facility] Marcine Matar, MD   1 year ago Moderate major depression [facility])   [place] [facility] Marcine Matar, MD       Future Appointments             In 2 months Marcine Matar, MD [place]  [facility]             traMADol (ULTRAM) 50 MG tablet 120 tablet 1    Sig: TAKE 1 TAB EVERY 4 (FOUR) HOURS AS NEEDED (EACH PRESCRIPTION TO LAST 1 MONTH).     Not Delegated - Analgesics:  Opioid Agonists Failed - 08/14/2022 11:34 AM      Failed - This refill cannot be delegated      Failed - Urine Drug Screen completed in last 360 days      Passed - Valid encounter within last 3 months    Recent Outpatient Visits           1 month ago Primary osteoarthritis of right hip   [facility] [place], [place], [place]   5 months ago Type 2 diabetes mellitus with obesity [facility])    [facility] Marcine Matar, MD  9 months ago Encounter for [employer] annual wellness exam   [place] [facility] & [facility] Jonah Blue B, MD   1 year ago Type 2 diabetes mellitus with obesity [facility])   [place] [facility] Marcine Matar, MD   1 year ago Moderate major depression [facility])    [facility] Marcine Matar, MD       Future Appointments             In 2 months Laural Benes, Binnie Rail, MD [facility]

## 2022-09-24 ENCOUNTER — Other Ambulatory Visit: Payer: Self-pay | Admitting: Physician Assistant

## 2022-09-24 DIAGNOSIS — I10 Essential (primary) hypertension: Secondary | ICD-10-CM

## 2022-11-07 ENCOUNTER — Ambulatory Visit: Payer: Medicare Other | Attending: Internal Medicine | Admitting: Internal Medicine

## 2022-11-07 ENCOUNTER — Encounter: Payer: Self-pay | Admitting: Internal Medicine

## 2022-11-07 VITALS — BP 131/84 | HR 99 | Temp 97.9°F | Ht 65.0 in | Wt 190.0 lb

## 2022-11-07 DIAGNOSIS — Z2821 Immunization not carried out because of patient refusal: Secondary | ICD-10-CM

## 2022-11-07 DIAGNOSIS — I83891 Varicose veins of right lower extremities with other complications: Secondary | ICD-10-CM

## 2022-11-07 DIAGNOSIS — M1611 Unilateral primary osteoarthritis, right hip: Secondary | ICD-10-CM | POA: Diagnosis not present

## 2022-11-07 DIAGNOSIS — E669 Obesity, unspecified: Secondary | ICD-10-CM

## 2022-11-07 DIAGNOSIS — E785 Hyperlipidemia, unspecified: Secondary | ICD-10-CM

## 2022-11-07 DIAGNOSIS — Z23 Encounter for immunization: Secondary | ICD-10-CM | POA: Diagnosis not present

## 2022-11-07 DIAGNOSIS — Z7984 Long term (current) use of oral hypoglycemic drugs: Secondary | ICD-10-CM

## 2022-11-07 DIAGNOSIS — E119 Type 2 diabetes mellitus without complications: Secondary | ICD-10-CM

## 2022-11-07 DIAGNOSIS — M5416 Radiculopathy, lumbar region: Secondary | ICD-10-CM | POA: Diagnosis not present

## 2022-11-07 DIAGNOSIS — I152 Hypertension secondary to endocrine disorders: Secondary | ICD-10-CM | POA: Diagnosis not present

## 2022-11-07 DIAGNOSIS — G8929 Other chronic pain: Secondary | ICD-10-CM

## 2022-11-07 DIAGNOSIS — E1159 Type 2 diabetes mellitus with other circulatory complications: Secondary | ICD-10-CM | POA: Diagnosis not present

## 2022-11-07 DIAGNOSIS — E1169 Type 2 diabetes mellitus with other specified complication: Secondary | ICD-10-CM | POA: Diagnosis not present

## 2022-11-07 LAB — POCT GLYCOSYLATED HEMOGLOBIN (HGB A1C): HbA1c, POC (controlled diabetic range): 5.9 % (ref 0.0–7.0)

## 2022-11-07 LAB — GLUCOSE, POCT (MANUAL RESULT ENTRY): POC Glucose: 122 mg/dL — AB (ref 70–99)

## 2022-11-07 MED ORDER — METFORMIN HCL 500 MG PO TABS
500.0000 mg | ORAL_TABLET | Freq: Two times a day (BID) | ORAL | 3 refills | Status: DC
Start: 2022-11-07 — End: 2023-05-12

## 2022-11-07 MED ORDER — TRAMADOL HCL 50 MG PO TABS: ORAL_TABLET | ORAL | 1 refills | Status: DC

## 2022-11-07 MED ORDER — CARVEDILOL 12.5 MG PO TABS
12.5000 mg | ORAL_TABLET | Freq: Two times a day (BID) | ORAL | 2 refills | Status: DC
Start: 2022-11-07 — End: 2023-08-04

## 2022-11-07 NOTE — Progress Notes (Signed)
Patient ID: Rachel Herman, female    DOB: 1950-11-02  MRN: [id number]  CC: Diabetes (DM f/u. Med refill - tramadol /Reports that BS meter/Concern about varicose veins causing pain - using trixaicin/No to shingles vax)   Subjective: Rachel Herman is a 72 y.o. female who presents for chronic ds management. Her concerns today include:  Pt with hx of HTN, DM, HL, DJD lumbar and spinal stenosis with hx of laminectomy 2016, obesity, depression, Ductal  CA LT s/p lumpectomy/XRT (2015 and 2016, followed by Dr. Parke Poisson), vaginal prolapse, CKD 3, RT lung nodule (CTA 10/2018 - stable).    DM: Results for orders placed or performed in visit on 11/07/22  POCT glycosylated hemoglobin (Hb A1C)  Result Value Ref Range   Hemoglobin A1C     HbA1c POC (<> result, manual entry)     HbA1c, POC (prediabetic range)     HbA1c, POC (controlled diabetic range) 5.9 0.0 - 7.0 %  POCT glucose (manual entry)  Result Value Ref Range   POC Glucose 122 (A) 70 - 99 mg/dl   Metformin written as 750 mg BID but reports taking only 1 tab twice/day. Uses her son's meter to check BS occasionally, #s have been good Up 13 lbs since last visit in May.  Not over eating but not moving as much.  RT hip has been bothering her.  Saw Dr. August Saucer 07/2022; had inj which help for several wks.  Needs THR but needs to have some dental work done first. -has eye appt later this mth with [facility]  HTN:  compliant with meds and salt restriction.  Should be on  clonidine 0.1 mg twice a day, carvedilol 6.25 mg 1-1/2 tablets twice a day (taking only 1 BID as pills too small), and Hydrochlorothiazide 25 mg daily Checks BP 2x/wk.  Reports it has been running a little high No dizziness, CP/SOB  HL: Taking and tolerating Lipitor 20 mg daily.  Last LDL was 57.  Chronic LBP: Up to date with controlled substance prescribing agreement and UDS both of which were done 02/2022. On tramadol and finds it helpful. No significant S.E.  Last dose was  2 days ago; ran out.  Last RF was end August.  [place] controlled substance reporting system reviewed by me today.    Complains of stinging on burning and varicose veins in the right leg.  She has not had any swelling in the legs.  Veins make her legs feel heavy.  Feeling a little stress because one of her cousin who has mental illness has been missing x 6 wks.    HM:  yes to flu vaccine.  Declines RSV, Shingrix and COVID vaccines  Patient Active Problem List   Diagnosis Date Noted   Stage 3a chronic kidney disease (HCC) 03/01/2022   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   Pneumococcal vaccination declined 10/18/2019   Chronic cough 08/03/2018   Other intervertebral disc degeneration, lumbar region 03/18/2018   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/21/2016   Lumbar pain with radiation down left and right leg 07/02/2016   Thyroid nodule 06/09/2015   Nodule of right lung 07/28/2014   Diverticulosis of colon without hemorrhage 07/28/2014   DJD (degenerative joint disease), lumbar 02/22/2014   Lumbar pain with radiation down left leg 01/17/2014   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 8 g 3   Ascorbic Acid (VITAMIN C) 1000 MG tablet Take 1,000 mg by mouth daily.     aspirin EC 325 MG tablet Take 325 mg by mouth daily.     atorvastatin (LIPITOR) 20 MG tablet TAKE 1 TABLET BY MOUTH DAILY. STOP PRAVASTATIN 90 tablet 1   Blood Glucose  Monitoring Suppl (ONE TOUCH ULTRA MINI) w/Device KIT USE AS DIRECTED ONCE DAILY DX CODE E11.9 1 each 0   carvedilol (COREG) 6.25 MG tablet TAKE ONE AND A HALF TABLETS BY MOUTH 2 TIMES DAILY WITH A MEAL. 270 tablet 0   cloNIDine (CATAPRES) 0.1 MG tablet TAKE 1 TABLET BY MOUTH 2 TIMES DAILY. 180 tablet 0   [facility] SENNA PLUS 8.6-50 MG tablet TAKE 2 TABLETS BY MOUTH AT BEDTIME AS NEEDED FOR MILD CONSTIPATION (Patient taking differently: Take 1 tablet by mouth at bedtime.) 60 tablet 11   gabapentin (NEURONTIN) 300 MG capsule TAKE 2 CAPSULES BY MOUTH 2 TIMES DAILY 360 capsule 1   hydrochlorothiazide (HYDRODIURIL) 25 MG tablet 1 tab PO daily 90 tablet 1   metFORMIN (GLUCOPHAGE) 500 MG tablet TAKE 1 & 1/2 TABLETS (750 MG) BY MOUTH 2 TIMES DAILY. 270 tablet 2   Multiple Vitamins-Minerals (MULTIVITAMIN WITH MINERALS) tablet Take 1 tablet by mouth daily. Reported on 03/13/2015     omeprazole (PRILOSEC) 20 MG capsule TAKE 1 CAPSULE (20 MG TOTAL) BY MOUTH 2 (TWO) TIMES DAILY BEFORE A MEAL. 180 capsule 1   ONETOUCH DELICA LANCETS 33G MISC USE AS DIRECTED ONCE DAILY DX CODE E11.9 100 each 12   ONETOUCH ULTRA test strip TEST ONCE DAILY AS DIRECTED E11.9 100 strip 12   potassium chloride (KLOR-CON) 10 MEQ tablet Take 1 tablet (10 mEq total) by mouth daily. 90 tablet 1   traMADol (ULTRAM) 50 MG tablet TAKE 1 TAB EVERY 4 (FOUR) HOURS AS NEEDED (EACH PRESCRIPTION TO LAST 1 MONTH). 120 tablet 1   diclofenac Sodium (VOLTAREN) 1 % GEL APPLY 2 G TOPICALLY 2 (TWO) TIMES DAILY AS NEEDED. (Patient not taking: Reported on 11/07/2022) 100 g 0   No current facility-administered medications on file prior to visit.    No Known Allergies  Social History   Socioeconomic History   Marital status: Married    Spouse name: Lidiana Saffran    Number of children: 3   Years of education: 12   Highest education level: Not on file  Occupational History    Employer: UNEMPLOYED  Tobacco Use   Smoking status: Never   Smokeless  tobacco: Never  Vaping Use   Vaping status: Never Used  Substance and Sexual Activity   Alcohol use: No   Drug use: No   Sexual activity: Not Currently    Birth control/protection: Post-menopausal  Other Topics Concern   Not on file  Social History Narrative   Married to [employer] in 1986.    Has 3 children from previous marriage, 1 in [place], 1 in [place], 1 in prison ([place]).    Lives with husband.    Right-handed.   1 cup caffeine per day.   Social Determinants of Corporate investment banker  Strain: Not on file  Food Insecurity: No Food Insecurity (02/26/2022)   Hunger Vital Sign    Worried About Running Out of Food in the Last Year: Never true    Ran Out of Food in the Last Year: Never true  Transportation Needs: No Transportation Needs (02/26/2022)   PRAPARE - Administrator, Civil Service (Medical): No    Lack of Transportation (Non-Medical): No  Physical Activity: Not on file  Stress: Not on file  Social Connections: Not on file  Intimate Partner Violence: Not on file    Family History  Problem Relation Age of Onset   Diabetes Mother    Multiple myeloma Mother 41   Hearing loss Mother    Diabetes Brother    Cancer Brother 46       prostate cancer    Diabetes Maternal Aunt    Leukemia Maternal Aunt        dx in her 59s   Alcoholism Brother    Heart attack Father    Diabetes Sister    Diabetes Son     Past Surgical History:  Procedure Laterality Date   ABDOMINAL HYSTERECTOMY N/A 09/20/2013   Procedure: HYSTERECTOMY ABDOMINAL;  Surgeon: Tereso Newcomer, MD;  Location: [facility];  Service: Gynecology;  Laterality: N/A;   BREAST LUMPECTOMY Left 2015   radiation   BREAST LUMPECTOMY WITH AXILLARY LYMPH NODE BIOPSY Left 12/29/13   left breast    CATARACT EXTRACTION Right    LAPAROSCOPIC ASSISTED VAGINAL HYSTERECTOMY  09/20/2013   Procedure: LAPAROSCOPIC ASSISTED VAGINAL HYSTERECTOMY;  Surgeon: Tereso Newcomer, MD;  Location: [facility];   Service: Gynecology;;  PT WAS EXAMINED WHILE UP IN [place] AND IT WAS DECIDED TO OPEN PT DUE TO LARGE MASS   LUMBAR LAMINECTOMY/DECOMPRESSION MICRODISCECTOMY Right 12/14/2014   Procedure: Right Lumbar three-four microdiskectomy;  Surgeon: Tressie Stalker, MD;  Location: [facility];  Service: Neurosurgery;  Laterality: Right;  Right Lumbar three-four microdiskectomy   RADIOACTIVE SEED GUIDED PARTIAL MASTECTOMY WITH AXILLARY SENTINEL LYMPH NODE BIOPSY Left 12/29/2013   Procedure: LEFT BREAST RADIOACTIVE SEED LOCALIZED LUMPECTOMY WITH SENTINEL LYMPH NODE MAPPING;  Surgeon: Harriette Bouillon, MD;  Location: [facility];  Service: General;  Laterality: Left;   RE-EXCISION OF BREAST LUMPECTOMY Left 03/02/2014   Procedure: RE-EXCISION OF LEFT BREAST LUMPECTOMY;  Surgeon: Harriette Bouillon, MD;  Location: [facility];  Service: General;  Laterality: Left;   SALPINGOOPHORECTOMY  09/20/2013   Procedure: SALPINGO OOPHORECTOMY;  Surgeon: Tereso Newcomer, MD;  Location: [facility];  Service: Gynecology;;    ROS: Review of Systems Negative except as stated above  PHYSICAL EXAM: BP (!) 145/85 (BP Location: Left Arm, Patient Position: Sitting, Cuff Size: Large)   Pulse 99   Temp 97.9 F (36.6 C) (Oral)   Ht 5\' 5"  (1.651 m)   Wt 190 lb (86.2 kg)   SpO2 (!) 74%   BMI 31.62 kg/m   Wt Readings from Last 3 Encounters:  11/07/22 190 lb (86.2 kg)  07/03/22 177 lb 3.2 oz (80.4 kg)  05/02/22 178 lb 8 oz (81 kg)    Physical Exam  General appearance - alert, well appearing, older Caucasian female and in no distress Mental status - normal mood, behavior, speech, dress, motor activity, and thought processes Neck - supple, no significant adenopathy Chest - clear to auscultation, no wheezes, rales or rhonchi, symmetric air entry Heart - normal rate, regular rhythm, normal S1, S2, no murmurs, rubs, clicks or gallops Extremities -  no lower extremity edema.  She has moderate varicose veins on  the right leg including the thigh, lower leg and feet.  Small amount of varicose veins on the left lower leg mainly below the knee and the feet.  Diabetic Foot Exam - Simple   Simple Foot Form Diabetic Foot exam was performed with the following findings: Yes 11/07/2022 10:16 AM  Visual Inspection No deformities, no ulcerations, no other skin breakdown bilaterally: Yes Sensation Testing Intact to touch and monofilament testing bilaterally: Yes Pulse Check Posterior Tibialis and Dorsalis pulse intact bilaterally: Yes Comments        11/07/2022    9:12 AM 07/03/2022    8:48 AM 03/01/2022    9:13 AM  Depression screen PHQ 2/9  Decreased Interest 0 1 2  Down, Depressed, Hopeless 1 0 2  PHQ - 2 Score 1 1 4   Altered sleeping 1 3 2   Tired, decreased energy 1 1 3   Change in appetite 0 1 3  Feeling bad or failure about yourself  0 0 0  Trouble concentrating 0 0 0  Moving slowly or fidgety/restless 0 3 0  Suicidal thoughts 0 0 0  PHQ-9 Score 3 9 12   Difficult doing work/chores Not difficult at all         Latest Ref Rng & Units 05/02/2022   10:41 AM 03/01/2022   10:10 AM 02/19/2022    8:20 PM  CMP  Glucose 70 - 99 mg/dL 829  562  130   BUN 8 - 23 mg/dL 19  20  19    Creatinine 0.44 - 1.00 mg/dL 8.65  7.84  6.96   Sodium 135 - 145 mmol/L 142  143  141   Potassium 3.5 - 5.1 mmol/L 3.6  4.3  3.1   Chloride 98 - 111 mmol/L 101  99  100   CO2 22 - 32 mmol/L 31  24  25    Calcium 8.9 - 10.3 mg/dL 9.1  29.5  9.4   Total Protein 6.5 - 8.1 g/dL 7.2   7.9   Total Bilirubin 0.3 - 1.2 mg/dL 0.6   0.6   Alkaline Phos 38 - 126 U/L 48   51   AST 15 - 41 U/L 14   31   ALT 0 - 44 U/L 11   23    Lipid Panel     Component Value Date/Time   CHOL 135 07/03/2022 0922   TRIG 236 (H) 07/03/2022 0922   HDL 40 07/03/2022 0922   CHOLHDL 3.4 07/03/2022 0922   CHOLHDL 5.9 09/27/2013 1053   VLDL 48 (H) 09/27/2013 1053   LDLCALC 57 07/03/2022 0922    CBC    Component Value Date/Time   WBC 9.1  05/02/2022 1041   WBC 10.7 (H) 02/19/2022 2020   RBC 4.60 05/02/2022 1041   HGB 13.1 05/02/2022 1041   HGB 12.8 06/25/2021 1154   HGB 14.5 11/08/2016 0915   HCT 40.9 05/02/2022 1041   HCT 39.9 06/25/2021 1154   HCT 44.2 11/08/2016 0915   PLT 282 05/02/2022 1041   PLT 307 06/25/2021 1154   MCV 88.9 05/02/2022 1041   MCV 86 06/25/2021 1154   MCV 85.3 11/08/2016 0915   MCH 28.5 05/02/2022 1041   MCHC 32.0 05/02/2022 1041   RDW 14.2 05/02/2022 1041   RDW 14.3 06/25/2021 1154   RDW 15.1 (H) 11/08/2016 0915   LYMPHSABS 2.4 05/02/2022 1041   LYMPHSABS 1.7 11/08/2016 0915   MONOABS 0.6 05/02/2022 1041  MONOABS 0.7 11/08/2016 0915   EOSABS 0.3 05/02/2022 1041   EOSABS 0.1 11/08/2016 0915   BASOSABS 0.1 05/02/2022 1041   BASOSABS 0.0 11/08/2016 0915    ASSESSMENT AND PLAN: 1. Type 2 diabetes mellitus with obesity (HCC) At goal. Continue healthy eating habits. Change metformin to reflect which she is taking which is 500 mg twice a day. - POCT glycosylated hemoglobin (Hb A1C) - POCT glucose (manual entry) - metFORMIN (GLUCOPHAGE) 500 MG tablet; Take 1 tablet (500 mg total) by mouth 2 (two) times daily with a meal.  Dispense: 180 tablet; Refill: 3  2. Diabetes mellitus treated with oral medication (HCC) See #1 above  3. Hypertension associated with diabetes (HCC) Repeat blood pressure today closer to goal.  Continue clonidine and HCTZ at current dosages.  Increase carvedilol to 12.5 mg twice a day. - carvedilol (COREG) 12.5 MG tablet; Take 1 tablet (12.5 mg total) by mouth 2 (two) times daily with a meal.  Dispense: 180 tablet; Refill: 2  4. Hyperlipidemia associated with type 2 diabetes mellitus (HCC) Continue atorvastatin.  5. Chronic radicular pain of lower back Patient stable on tramadol and reports good pain relief.  No significant side effects from the medication.  [place] controlled substance reviewed.  She has not displayed any aberrant behaviors. - traMADol  (ULTRAM) 50 MG tablet; TAKE 1 TAB EVERY 4 (FOUR) HOURS AS NEEDED (EACH PRESCRIPTION TO LAST 1 MONTH).  Dispense: 120 tablet; Refill: 1  6. Symptomatic varicose veins, right Recommend referral to vein and vascular to see if she would benefit from laser treatment.  In the meantime I have recommended purchasing and wearing compression socks below the knee. - Ambulatory referral to Vascular Surgery  7. Primary osteoarthritis of right hip Needs hip replacement but needs to have dental work done first.  She states that right now she is unable to afford to see the dentist but will try to save towards it.  8. Need for influenza vaccination Given today.  9. COVID-19 vaccination declined Recommended.  Patient declined.     Patient was given the opportunity to ask questions.  Patient verbalized understanding of the plan and was able to repeat key elements of the plan.   This documentation was completed using Paediatric nurse.  Any transcriptional errors are unintentional.  Orders Placed This Encounter  Procedures   POCT glycosylated hemoglobin (Hb A1C)   POCT glucose (manual entry)     Requested Prescriptions    No prescriptions requested or ordered in this encounter    No follow-ups on file.  Jonah Blue, MD, FACP

## 2022-11-07 NOTE — Patient Instructions (Signed)
We have referred you to the vein and vascular specialist for the symptomatic varicose veins.  You should purchase a pair of compression socks that stop below the knee and wear them daily.  Your blood pressure is not controlled.  We have increased the carvedilol to 12.5 mg twice a day.  Do not share diabetes testing supplies.  We will send prescription to your pharmacy for testing supplies.

## 2022-11-22 ENCOUNTER — Other Ambulatory Visit: Payer: Self-pay | Admitting: Internal Medicine

## 2022-11-22 DIAGNOSIS — I10 Essential (primary) hypertension: Secondary | ICD-10-CM

## 2022-11-27 ENCOUNTER — Other Ambulatory Visit: Payer: Self-pay | Admitting: Internal Medicine

## 2022-11-27 DIAGNOSIS — K219 Gastro-esophageal reflux disease without esophagitis: Secondary | ICD-10-CM

## 2022-11-27 DIAGNOSIS — I10 Essential (primary) hypertension: Secondary | ICD-10-CM

## 2022-11-27 NOTE — Telephone Encounter (Signed)
Medication Refill - Medication:  omeprazole (PRILOSEC) 20 MG capsule  *completely out    Has the patient contacted their pharmacy? Yes, advised to contact PCP. States the pharmacy was supposed to send a refill request over.  Preferred Pharmacy (with phone number or street name):  [id number] [place], [place] - [address] Phone: [phone]  Fax: [phone]    Has the patient been seen for an appointment in the last year OR does the patient have an upcoming appointment? Yes.

## 2022-12-06 ENCOUNTER — Other Ambulatory Visit: Payer: Self-pay | Admitting: *Deleted

## 2022-12-06 DIAGNOSIS — I8393 Asymptomatic varicose veins of bilateral lower extremities: Secondary | ICD-10-CM

## 2022-12-08 ENCOUNTER — Other Ambulatory Visit: Payer: Self-pay | Admitting: Internal Medicine

## 2022-12-08 ENCOUNTER — Other Ambulatory Visit: Payer: Self-pay | Admitting: Physician Assistant

## 2022-12-08 DIAGNOSIS — M5416 Radiculopathy, lumbar region: Secondary | ICD-10-CM

## 2022-12-08 DIAGNOSIS — K219 Gastro-esophageal reflux disease without esophagitis: Secondary | ICD-10-CM

## 2022-12-09 NOTE — Telephone Encounter (Signed)
Requested by interface surescripts. Future visit in 3 months.  Requested Prescriptions  Pending Prescriptions Disp Refills   gabapentin (NEURONTIN) 300 MG capsule [Pharmacy Med Name: GABAPENTIN 300 MG CAPSULE] 360 capsule 0    Sig: TAKE 2 CAPSULES BY MOUTH TWICE A DAY     Neurology: Anticonvulsants - gabapentin Failed - 12/08/2022 10:43 AM      Failed - Cr in normal range and within 360 days    Creatinine  Date Value Ref Range Status  05/02/2022 1.03 (H) 0.44 - 1.00 mg/dL Final  95/28/4132 44.0 20.0 - 300.0 mg/dL Final  12/01/2534 1.2 (H) 0.6 - 1.1 mg/dL Final   Creatinine, POC  Date Value Ref Range Status  07/02/2016 100mg  mg/dL Final         Passed - Completed PHQ-2 or PHQ-9 in the last 360 days      Passed - Valid encounter within last 12 months    Recent Outpatient Visits           1 month ago Type 2 diabetes mellitus with obesity (HCC)   [facility] [place]. [facility] Marcine Matar, MD   5 months ago Primary osteoarthritis of right hip   [facility] [place], Marylene Land M, [place]   9 months ago Type 2 diabetes mellitus with obesity [facility])   [facility] Of [place]. [facility] Marcine Matar, MD   1 year ago Encounter for [employer] annual wellness exam   [facility] - A Dept Of [place]. [facility] Marcine Matar, MD   1 year ago Type 2 diabetes mellitus with obesity [facility])   [facility] - A Dept Of [place]. [facility] Marcine Matar, MD       Future Appointments             In 3 months Laural Benes Binnie Rail, MD [facility] - A Dept Of [place]. [facility]

## 2022-12-11 NOTE — Progress Notes (Addendum)
Patient ID: Rachel Herman, female   DOB: 06-Jul-1950, 72 y.o.   MRN: [id number]  Reason for Consult: New Patient (Initial Visit)   Referred by Marcine Matar, MD  Subjective:     HPI  Rachel Herman is a 72 y.o. 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   Diabetes mellitus (HCC) 09/16/13   Diagnosed on 09/16/13; HgA1C was 7.2/metformin   Dizziness    GERD (gastroesophageal reflux disease)    Headache    Hyperlipidemia    Hypertension 2014   Low back pain    Obesity, morbid, BMI 40.0-49.9 (HCC)    PONV (postoperative nausea and vomiting)    S/P radiation therapy 04/05/14-05/20/14   left breast 60.4Gy totaldose   Family History  Problem Relation Age of Onset   Diabetes Mother    Multiple myeloma Mother 66   Hearing loss Mother    Diabetes Brother    Cancer Brother 40       prostate cancer    Diabetes Maternal Aunt    Leukemia Maternal Aunt        dx in her 17s   Alcoholism Brother    Heart attack Father    Diabetes Sister    Diabetes Son    Past Surgical History:  Procedure Laterality Date   ABDOMINAL HYSTERECTOMY N/A 09/20/2013   Procedure: HYSTERECTOMY ABDOMINAL;  Surgeon: Tereso Newcomer, MD;  Location: [facility];  Service: Gynecology;  Laterality: N/A;   BREAST LUMPECTOMY Left 2015   radiation   BREAST LUMPECTOMY WITH AXILLARY LYMPH NODE BIOPSY Left 12/29/13   left breast    CATARACT EXTRACTION Right    LAPAROSCOPIC ASSISTED VAGINAL HYSTERECTOMY  09/20/2013   Procedure: LAPAROSCOPIC ASSISTED VAGINAL HYSTERECTOMY;  Surgeon: Tereso Newcomer, MD;  Location: [facility];  Service: Gynecology;;  PT WAS EXAMINED WHILE UP IN STIRRUPS  AND IT WAS DECIDED TO OPEN PT DUE TO LARGE MASS   LUMBAR LAMINECTOMY/DECOMPRESSION MICRODISCECTOMY Right 12/14/2014   Procedure: Right Lumbar three-four microdiskectomy;  Surgeon: Tressie Stalker, MD;  Location: [facility];  Service: Neurosurgery;  Laterality: Right;  Right Lumbar three-four microdiskectomy   RADIOACTIVE SEED GUIDED PARTIAL MASTECTOMY WITH AXILLARY SENTINEL LYMPH NODE BIOPSY Left 12/29/2013   Procedure: LEFT BREAST RADIOACTIVE SEED LOCALIZED LUMPECTOMY WITH SENTINEL LYMPH NODE MAPPING;  Surgeon: Harriette Bouillon, MD;  Location: [facility];  Service: General;  Laterality: Left;   RE-EXCISION OF BREAST LUMPECTOMY Left 03/02/2014   Procedure: RE-EXCISION OF LEFT BREAST LUMPECTOMY;  Surgeon: Harriette Bouillon, MD;  Location: [facility];  Service: General;  Laterality: Left;   SALPINGOOPHORECTOMY  09/20/2013   Procedure: SALPINGO OOPHORECTOMY;  Surgeon: Tereso Newcomer, MD;  Location: [facility];  Service: Gynecology;;    Short Social History:  Social History   Tobacco Use   Smoking status: Never   Smokeless tobacco: Never  Substance Use Topics   Alcohol use: No    No Known Allergies  Current Outpatient Medications  Medication Sig Dispense Refill   albuterol (VENTOLIN HFA) 108 (90 Base) MCG/ACT inhaler Inhale 2 puffs into the lungs every 6 (six) hours as needed for wheezing or shortness of breath. 8 g  3   Ascorbic Acid (VITAMIN C) 1000 MG tablet Take 1,000 mg by mouth daily.     aspirin EC 325 MG tablet Take 325 mg by mouth daily.     atorvastatin (LIPITOR) 20 MG tablet TAKE 1 TABLET BY MOUTH DAILY. STOP PRAVASTATIN 90 tablet 1   Blood Glucose Monitoring Suppl (ONE TOUCH ULTRA MINI) w/Device KIT USE AS DIRECTED ONCE DAILY DX CODE E11.9 1 each 0   carvedilol (COREG) 12.5 MG tablet Take 1 tablet (12.5 mg total) by mouth 2 (two) times daily with a meal. 180 tablet 2   cloNIDine (CATAPRES) 0.1 MG tablet TAKE 1 TABLET BY MOUTH TWICE A DAY 180 tablet 0   [facility]  SENNA PLUS 8.6-50 MG tablet TAKE 2 TABLETS BY MOUTH AT BEDTIME AS NEEDED FOR MILD CONSTIPATION (Patient taking differently: Take 1 tablet by mouth at bedtime.) 60 tablet 11   diclofenac Sodium (VOLTAREN) 1 % GEL APPLY 2 G TOPICALLY 2 (TWO) TIMES DAILY AS NEEDED. 100 g 0   gabapentin (NEURONTIN) 300 MG capsule TAKE 2 CAPSULES BY MOUTH TWICE A DAY 360 capsule 0   hydrochlorothiazide (HYDRODIURIL) 25 MG tablet 1 tab PO daily 90 tablet 1   metFORMIN (GLUCOPHAGE) 500 MG tablet Take 1 tablet (500 mg total) by mouth 2 (two) times daily with a meal. 180 tablet 3   Multiple Vitamins-Minerals (MULTIVITAMIN WITH MINERALS) tablet Take 1 tablet by mouth daily. Reported on 03/13/2015     omeprazole (PRILOSEC) 20 MG capsule TAKE 1 CAPSULE (20 MG TOTAL) BY MOUTH 2 (TWO) TIMES DAILY BEFORE A MEAL. 180 capsule 1   ONETOUCH DELICA LANCETS 33G MISC USE AS DIRECTED ONCE DAILY DX CODE E11.9 100 each 12   ONETOUCH ULTRA test strip TEST ONCE DAILY AS DIRECTED E11.9 100 strip 12   potassium chloride (KLOR-CON) 10 MEQ tablet Take 1 tablet (10 mEq total) by mouth daily. 90 tablet 1   traMADol (ULTRAM) 50 MG tablet TAKE 1 TAB EVERY 4 (FOUR) HOURS AS NEEDED (EACH PRESCRIPTION TO LAST 1 MONTH). 120 tablet 1   No current facility-administered medications for this visit.    REVIEW OF SYSTEMS  Negative other than noted in HPI     Objective:  Objective   Vitals:   12/13/22 1138  BP: 138/82  Pulse: 63  Resp: 20  Temp: 97.7 F (36.5 C)  SpO2: 97%  Weight: 193 lb (87.5 kg)  Height: 5\' 5"  (1.651 m)   Body mass index is 32.12 kg/m.  Physical Exam General: no acute distress Cardiac: hemodynamically stable Pulm: normal work of breathing Neuro: alert, no focal deficit Extremities: Right leg with ropey varicosity on knee and anterior shin, minimal edema bilaterally, venous congestion present right medial ankle Vascular:   Right: palpable DP  Left: palpable DP   Data: Reflux study Venous Reflux Times   +--------------+---------+------+-----------+------------+-------------+  RIGHT        Reflux NoRefluxReflux TimeDiameter cmsComments                               Yes                                        +--------------+---------+------+-----------+------------+-------------+  CFV                    yes   >1 second                            +--------------+---------+------+-----------+------------+-------------+  FV mid        no                                                   +--------------+---------+------+-----------+------------+-------------+  Popliteal    no                                                   +--------------+---------+------+-----------+------------+-------------+  GSV at SFJ              yes    >500 ms      .381                   +--------------+---------+------+-----------+------------+-------------+  GSV prox thighno                            .212                   +--------------+---------+------+-----------+------------+-------------+  GSV mid thigh no                            .202                   +--------------+---------+------+-----------+------------+-------------+  GSV dist thighno                            .175    out of fascia  +--------------+---------+------+-----------+------------+-------------+  GSV at knee   no                            .128                   +--------------+---------+------+-----------+------------+-------------+  GSV prox calf no                            .096                   +--------------+---------+------+-----------+------------+-------------+  SSV Pop Fossa no                            .11                    +--------------+---------+------+-----------+------------+-------------+  SSV prox calf no                            .12                    +--------------+---------+------+-----------+------------+-------------+        Assessment/Plan:     Rachel Herman is a 72 y.o. female with chronic venous insufficiency with C2 disease and reflux noted in common femoral vein and SFJ I explained the foundation of CVI treatment of compression and elevation I recommended medical grade graduated compression stockings and intermittent leg elevation We also discussed that many patients find symptom improvement with exercise and if they have access to a pool should attempt  water aerobics.  I explained that I think most of her leg pain is likely stemming from her lumbar back issues.  Follow-up as needed    Daria Pastures MD [facility]

## 2022-12-13 ENCOUNTER — Ambulatory Visit (HOSPITAL_COMMUNITY)
Admission: RE | Admit: 2022-12-13 | Discharge: 2022-12-13 | Disposition: A | Discharge status: Home / Self Care | Payer: Medicare Other | Source: Ambulatory Visit | Attending: Vascular Surgery | Admitting: Vascular Surgery

## 2022-12-13 ENCOUNTER — Encounter: Payer: Self-pay | Admitting: Vascular Surgery

## 2022-12-13 ENCOUNTER — Ambulatory Visit: Payer: Medicare Other | Admitting: Vascular Surgery

## 2022-12-13 VITALS — BP 138/82 | HR 63 | Temp 97.7°F | Resp 20 | Ht 65.0 in | Wt 193.0 lb

## 2022-12-13 DIAGNOSIS — I8393 Asymptomatic varicose veins of bilateral lower extremities: Secondary | ICD-10-CM | POA: Insufficient documentation

## 2022-12-13 DIAGNOSIS — I872 Venous insufficiency (chronic) (peripheral): Secondary | ICD-10-CM | POA: Diagnosis not present

## 2022-12-17 DIAGNOSIS — E119 Type 2 diabetes mellitus without complications: Secondary | ICD-10-CM | POA: Diagnosis not present

## 2022-12-17 DIAGNOSIS — H33302 Unspecified retinal break, left eye: Secondary | ICD-10-CM | POA: Diagnosis not present

## 2022-12-17 DIAGNOSIS — H40013 Open angle with borderline findings, low risk, bilateral: Secondary | ICD-10-CM | POA: Diagnosis not present

## 2022-12-17 LAB — HM DIABETES EYE EXAM

## 2022-12-25 ENCOUNTER — Telehealth: Payer: Self-pay | Admitting: Internal Medicine

## 2022-12-25 NOTE — Telephone Encounter (Signed)
Eustace Pen:  I received copy of DM eye exam note from [facility] in my in-basket box CC Chart.  She had her DM eye exam done 12/17/22.  Abstraction was not done even after signing off on the note.  Who do I need to contact to address this? Had several of these today.

## 2022-12-31 ENCOUNTER — Other Ambulatory Visit: Payer: Self-pay | Admitting: Physician Assistant

## 2022-12-31 DIAGNOSIS — I152 Hypertension secondary to endocrine disorders: Secondary | ICD-10-CM

## 2022-12-31 DIAGNOSIS — E876 Hypokalemia: Secondary | ICD-10-CM

## 2022-12-31 DIAGNOSIS — E1169 Type 2 diabetes mellitus with other specified complication: Secondary | ICD-10-CM

## 2023-01-06 ENCOUNTER — Telehealth: Payer: Self-pay | Admitting: Physical Medicine and Rehabilitation

## 2023-01-06 NOTE — Telephone Encounter (Signed)
Patient called wanting to set an appointment for hip inj. CB#[phone]

## 2023-01-08 ENCOUNTER — Other Ambulatory Visit: Payer: Self-pay | Admitting: Internal Medicine

## 2023-01-08 DIAGNOSIS — G8929 Other chronic pain: Secondary | ICD-10-CM

## 2023-01-10 ENCOUNTER — Telehealth: Payer: Self-pay | Admitting: Physical Medicine and Rehabilitation

## 2023-01-10 NOTE — Telephone Encounter (Signed)
Patient called and said she has been waiting for someone to call her to make an appointment for her hip injections. [phone]

## 2023-01-31 NOTE — Telephone Encounter (Signed)
I left voicemail for patient requesting return call. She had previous right hip injection with Dr. August Saucer on 07/11/2022.  Prior to that, right hip injection with Dr. Alvester Morin on 11/08/2020.  Please find out which hip and if previous relief from injections. Patient can be scheduled with Franky Macho for injection if patient does not have preference of Dr. Alvester Morin.

## 2023-01-31 NOTE — Telephone Encounter (Signed)
Duplicate message in chart.  

## 2023-02-14 ENCOUNTER — Other Ambulatory Visit: Payer: Self-pay | Admitting: Internal Medicine

## 2023-02-14 DIAGNOSIS — I10 Essential (primary) hypertension: Secondary | ICD-10-CM

## 2023-02-21 ENCOUNTER — Other Ambulatory Visit: Payer: Self-pay | Admitting: Internal Medicine

## 2023-02-21 DIAGNOSIS — I10 Essential (primary) hypertension: Secondary | ICD-10-CM

## 2023-03-10 ENCOUNTER — Encounter: Payer: Self-pay | Admitting: Internal Medicine

## 2023-03-10 ENCOUNTER — Ambulatory Visit: Payer: Medicare Other | Attending: Internal Medicine | Admitting: Internal Medicine

## 2023-03-10 VITALS — BP 104/71 | HR 80 | Temp 98.0°F | Ht 65.0 in | Wt 189.0 lb

## 2023-03-10 DIAGNOSIS — E1159 Type 2 diabetes mellitus with other circulatory complications: Secondary | ICD-10-CM | POA: Diagnosis not present

## 2023-03-10 DIAGNOSIS — Z5986 Financial insecurity: Secondary | ICD-10-CM | POA: Diagnosis not present

## 2023-03-10 DIAGNOSIS — Z7984 Long term (current) use of oral hypoglycemic drugs: Secondary | ICD-10-CM | POA: Diagnosis not present

## 2023-03-10 DIAGNOSIS — G8929 Other chronic pain: Secondary | ICD-10-CM

## 2023-03-10 DIAGNOSIS — Z6831 Body mass index (BMI) 31.0-31.9, adult: Secondary | ICD-10-CM

## 2023-03-10 DIAGNOSIS — E785 Hyperlipidemia, unspecified: Secondary | ICD-10-CM | POA: Diagnosis not present

## 2023-03-10 DIAGNOSIS — E1169 Type 2 diabetes mellitus with other specified complication: Secondary | ICD-10-CM

## 2023-03-10 DIAGNOSIS — Z2821 Immunization not carried out because of patient refusal: Secondary | ICD-10-CM | POA: Diagnosis not present

## 2023-03-10 DIAGNOSIS — Z79899 Other long term (current) drug therapy: Secondary | ICD-10-CM | POA: Diagnosis not present

## 2023-03-10 DIAGNOSIS — I152 Hypertension secondary to endocrine disorders: Secondary | ICD-10-CM

## 2023-03-10 DIAGNOSIS — M5416 Radiculopathy, lumbar region: Secondary | ICD-10-CM | POA: Diagnosis not present

## 2023-03-10 DIAGNOSIS — F1190 Opioid use, unspecified, uncomplicated: Secondary | ICD-10-CM

## 2023-03-10 DIAGNOSIS — E669 Obesity, unspecified: Secondary | ICD-10-CM

## 2023-03-10 DIAGNOSIS — E119 Type 2 diabetes mellitus without complications: Secondary | ICD-10-CM

## 2023-03-10 LAB — POCT GLYCOSYLATED HEMOGLOBIN (HGB A1C): HbA1c, POC (controlled diabetic range): 6 % (ref 0.0–7.0)

## 2023-03-10 LAB — GLUCOSE, POCT (MANUAL RESULT ENTRY): POC Glucose: 117 mg/dL — AB (ref 70–99)

## 2023-03-10 MED ORDER — ACCU-CHEK SOFTCLIX LANCETS MISC: 12 refills | Status: AC

## 2023-03-10 MED ORDER — ACCU-CHEK GUIDE TEST VI STRP: ORAL_STRIP | 12 refills | Status: AC

## 2023-03-10 MED ORDER — ACCU-CHEK GUIDE W/DEVICE KIT: PACK | 0 refills | Status: DC

## 2023-03-10 NOTE — Progress Notes (Signed)
Patient ID: CHAUNTAE HULTS, female    DOB: 1950/10/22  MRN: [id number]  CC: Diabetes (DM f/u./Requesting a new BP & BS meter)   Subjective: Durinda Buzzelli is a 73 y.o. female who presents for chronic ds management. Her concerns today include:  Pt with hx of HTN, DM, HL, DJD lumbar and spinal stenosis with hx of laminectomy 2016, obesity, depression, Ductal  CA LT s/p lumpectomy/XRT (2015 and 2016, followed by Dr. Parke Poisson), vaginal prolapse, CKD 3, RT lung nodule (CTA 10/2018 - stable).    DM: Results for orders placed or performed in visit on 03/10/23  POCT glucose (manual entry)   Collection Time: 03/10/23 10:41 AM  Result Value Ref Range   POC Glucose 117 (A) 70 - 99 mg/dl  POCT glycosylated hemoglobin (Hb A1C)   Collection Time: 03/10/23 11:07 AM  Result Value Ref Range   Hemoglobin A1C     HbA1c POC (<> result, manual entry)     HbA1c, POC (prediabetic range)     HbA1c, POC (controlled diabetic range) 6.0 0.0 - 7.0 %  On Metformin 500 mg BID Checks BS every other day. Range 85-120.  Having to use someone else's glucometer.  Pt states I was suppose to send rxn for new device for her on last visit but forgot to do so Eating healthy.  Wgh stable since last visit.   HTN: On clonidine 0.1 mg twice a day, carvedilol 12.5 mg tablets twice a day and Hydrochlorothiazide 25 mg daily [place] BP [place] other [place] [employer] [place].  #s have been good No CP/SOB/HA  HL:  Taking and tolerating Lipitor 20 mg daily   Chronic LBP:  "my back and hip hurts 24/7. Hard to get around but I still walk" with my cane.  No falls. Tramadol helpful.  No drowsiness or severe constipation.  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   Pneumococcal vaccination declined 10/18/2019   Chronic cough 08/03/2018   Other intervertebral disc degeneration, lumbar region  03/18/2018   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/21/2016   Lumbar pain with radiation down left and right leg 07/02/2016   Thyroid nodule 06/09/2015   Nodule of right lung 07/28/2014   Diverticulosis of colon without hemorrhage 07/28/2014   DJD (degenerative joint disease), lumbar 02/22/2014   Lumbar pain with radiation down left leg 01/17/2014   Breast cancer of upper-inner quadrant of left female breast (HCC) 01/05/2014   Hyperlipidemia associated with type 2 diabetes mellitus (HCC) 09/28/2013   S/P total hysterectomy and bilateral salpingo-oophorectomy 09/20/2013   Essential hypertension    Diabetes mellitus (HCC) 09/16/2013     Current Outpatient Medications on File Prior to Visit  Medication Sig Dispense Refill   albuterol (VENTOLIN HFA) 108 (90 Base) MCG/ACT inhaler Inhale 2 puffs into the lungs every 6 (six) hours as needed for wheezing or shortness of breath. 8 g 3   Ascorbic Acid (VITAMIN C) 1000 MG tablet Take 1,000 mg by mouth daily.     aspirin EC 325 MG tablet Take 325 mg by mouth daily.     atorvastatin (LIPITOR) 20 MG tablet TAKE 1 TABLET BY MOUTH DAILY. STOP PRAVASTATIN 90 tablet 1   carvedilol (COREG) 12.5 MG tablet Take 1 tablet (12.5 mg total) by mouth 2 (two) times daily with a meal.  180 tablet 2   cloNIDine (CATAPRES) 0.1 MG tablet TAKE 1 TABLET BY MOUTH TWICE A DAY 180 tablet 0   [facility] SENNA PLUS 8.6-50 MG tablet TAKE 2 TABLETS BY MOUTH AT BEDTIME AS NEEDED FOR MILD CONSTIPATION (Patient taking differently: Take 1 tablet by mouth at bedtime.) 60 tablet 11   diclofenac Sodium (VOLTAREN) 1 % GEL APPLY 2 G TOPICALLY 2 (TWO) TIMES DAILY AS NEEDED. 100 g 0   gabapentin (NEURONTIN) 300 MG capsule TAKE 2 CAPSULES BY MOUTH TWICE A DAY 360 capsule 0   hydrochlorothiazide (HYDRODIURIL) 25 MG tablet TAKE 1 TABLET BY MOUTH EVERY DAY 90 tablet 1   metFORMIN (GLUCOPHAGE) 500 MG tablet Take 1 tablet (500 mg total) by mouth 2  (two) times daily with a meal. 180 tablet 3   Multiple Vitamins-Minerals (MULTIVITAMIN WITH MINERALS) tablet Take 1 tablet by mouth daily. Reported on 03/13/2015     omeprazole (PRILOSEC) 20 MG capsule TAKE 1 CAPSULE (20 MG TOTAL) BY MOUTH 2 (TWO) TIMES DAILY BEFORE A MEAL. 180 capsule 1   potassium chloride (KLOR-CON) 10 MEQ tablet TAKE 1 TABLET BY MOUTH EVERY DAY 90 tablet 1   traMADol (ULTRAM) 50 MG tablet TAKE 1 TAB EVERY 4 (FOUR) HOURS AS NEEDED (EACH PRESCRIPTION TO LAST 1 MONTH). 120 tablet 1   No current facility-administered medications on file prior to visit.    No Known Allergies  Social History   Socioeconomic History   Marital status: Married    Spouse name: Willo Yoon    Number of children: 3   Years of education: 12   Highest education level: Not on file  Occupational History    Employer: UNEMPLOYED  Tobacco Use   Smoking status: Never   Smokeless tobacco: Never  Vaping Use   Vaping status: Never Used  Substance and Sexual Activity   Alcohol use: No   Drug use: No   Sexual activity: Not Currently    Birth control/protection: Post-menopausal  Other Topics Concern   Not on file  Social History Narrative   Married to [employer] in 1986.    Has 3 children from previous marriage, 1 in [place], 1 in [place], 1 in prison ([place]).    Lives with husband.    Right-handed.   1 cup caffeine per day.   Social Drivers of Health   Financial Resource Strain: Medium Risk (03/10/2023)   Overall Financial Resource Strain (CARDIA)    Difficulty of Paying Living Expenses: Somewhat hard  Food Insecurity: Food Insecurity Present (03/10/2023)   Hunger Vital Sign    Worried About Running Out of Food in the Last Year: Sometimes true    Ran Out of Food in the Last Year: Sometimes true  Transportation Needs: No Transportation Needs (03/10/2023)   PRAPARE - Administrator, Civil Service (Medical): No    Lack of Transportation (Non-Medical): No  Physical  Activity: Sufficiently Active (03/10/2023)   Exercise Vital Sign    Days of Exercise per Week: 5 days    Minutes of Exercise per Session: 30 min  Stress: Stress Concern Present (03/10/2023)   [employer] of Occupational Health - Occupational Stress Questionnaire    Feeling of Stress : To some extent  Social Connections: Moderately Integrated (03/10/2023)   Social Connection and Isolation Panel [NHANES]    Frequency of Communication with Friends and Family: More than three times a week    Frequency of Social Gatherings with Friends and Family: Once a week    Attends  Religious Services: More than 4 times per year    Active Member of Clubs or Organizations: Yes    Attends Banker Meetings: More than 4 times per year    Marital Status: Widowed  Intimate Partner Violence: Not At Risk (03/10/2023)   Humiliation, Afraid, Rape, and Kick questionnaire    Fear of Current or Ex-Partner: No    Emotionally Abused: No    Physically Abused: No    Sexually Abused: No    Family History  Problem Relation Age of Onset   Diabetes Mother    Multiple myeloma Mother 23   Hearing loss Mother    Diabetes Brother    Cancer Brother 74       prostate cancer    Diabetes Maternal Aunt    Leukemia Maternal Aunt        dx in her 50s   Alcoholism Brother    Heart attack Father    Diabetes Sister    Diabetes Son     Past Surgical History:  Procedure Laterality Date   ABDOMINAL HYSTERECTOMY N/A 09/20/2013   Procedure: HYSTERECTOMY ABDOMINAL;  Surgeon: Tereso Newcomer, MD;  Location: [facility];  Service: Gynecology;  Laterality: N/A;   BREAST LUMPECTOMY Left 2015   radiation   BREAST LUMPECTOMY WITH AXILLARY LYMPH NODE BIOPSY Left 12/29/13   left breast    CATARACT EXTRACTION Right    LAPAROSCOPIC ASSISTED VAGINAL HYSTERECTOMY  09/20/2013   Procedure: LAPAROSCOPIC ASSISTED VAGINAL HYSTERECTOMY;  Surgeon: Tereso Newcomer, MD;  Location: [facility];  Service: Gynecology;;  PT WAS EXAMINED WHILE UP IN  STIRRUPS AND IT WAS DECIDED TO OPEN PT DUE TO LARGE MASS   LUMBAR LAMINECTOMY/DECOMPRESSION MICRODISCECTOMY Right 12/14/2014   Procedure: Right Lumbar three-four microdiskectomy;  Surgeon: Tressie Stalker, MD;  Location: [facility];  Service: Neurosurgery;  Laterality: Right;  Right Lumbar three-four microdiskectomy   RADIOACTIVE SEED GUIDED PARTIAL MASTECTOMY WITH AXILLARY SENTINEL LYMPH NODE BIOPSY Left 12/29/2013   Procedure: LEFT BREAST RADIOACTIVE SEED LOCALIZED LUMPECTOMY WITH SENTINEL LYMPH NODE MAPPING;  Surgeon: Harriette Bouillon, MD;  Location: [facility];  Service: General;  Laterality: Left;   RE-EXCISION OF BREAST LUMPECTOMY Left 03/02/2014   Procedure: RE-EXCISION OF LEFT BREAST LUMPECTOMY;  Surgeon: Harriette Bouillon, MD;  Location: [facility];  Service: General;  Laterality: Left;   SALPINGOOPHORECTOMY  09/20/2013   Procedure: SALPINGO OOPHORECTOMY;  Surgeon: Tereso Newcomer, MD;  Location: [facility];  Service: Gynecology;;    ROS: Review of Systems Negative except as stated above  PHYSICAL EXAM: BP 104/71 (BP Location: Left Arm, Patient Position: Sitting, Cuff Size: Normal)   Pulse 80   Temp 98 F (36.7 C) (Oral)   Ht 5\' 5"  (1.651 m)   Wt 189 lb (85.7 kg)   SpO2 98%   BMI 31.45 kg/m   Wt Readings from Last 3 Encounters:  03/10/23 189 lb (85.7 kg)  12/13/22 193 lb (87.5 kg)  11/07/22 190 lb (86.2 kg)    Physical Exam  General appearance - alert, well appearing, elderly Caucasian female and in no distress Mental status - normal mood, behavior, speech, dress, motor activity, and thought processes Neck - supple, no significant adenopathy Chest - clear to auscultation, no wheezes, rales or rhonchi, symmetric air entry Heart - normal rate, regular rhythm, normal S1, S2, no murmurs, rubs, clicks or gallops Extremities - peripheral pulses normal, no pedal edema, no clubbing or cyanosis.  Patient ambulates with 4 prong cane. Gait is slow.  Latest Ref Rng & Units 05/02/2022   10:41 AM 03/01/2022   10:10 AM 02/19/2022    8:20 PM  CMP  Glucose 70 - 99 mg/dL 956  213  086   BUN 8 - 23 mg/dL 19  20  19    Creatinine 0.44 - 1.00 mg/dL 5.78  4.69  6.29   Sodium 135 - 145 mmol/L 142  143  141   Potassium 3.5 - 5.1 mmol/L 3.6  4.3  3.1   Chloride 98 - 111 mmol/L 101  99  100   CO2 22 - 32 mmol/L 31  24  25    Calcium 8.9 - 10.3 mg/dL 9.1  52.8  9.4   Total Protein 6.5 - 8.1 g/dL 7.2   7.9   Total Bilirubin 0.3 - 1.2 mg/dL 0.6   0.6   Alkaline Phos 38 - 126 U/L 48   51   AST 15 - 41 U/L 14   31   ALT 0 - 44 U/L 11   23    Lipid Panel     Component Value Date/Time   CHOL 135 07/03/2022 0922   TRIG 236 (H) 07/03/2022 0922   HDL 40 07/03/2022 0922   CHOLHDL 3.4 07/03/2022 0922   CHOLHDL 5.9 09/27/2013 1053   VLDL 48 (H) 09/27/2013 1053   LDLCALC 57 07/03/2022 0922    CBC    Component Value Date/Time   WBC 9.1 05/02/2022 1041   WBC 10.7 (H) 02/19/2022 2020   RBC 4.60 05/02/2022 1041   HGB 13.1 05/02/2022 1041   HGB 12.8 06/25/2021 1154   HGB 14.5 11/08/2016 0915   HCT 40.9 05/02/2022 1041   HCT 39.9 06/25/2021 1154   HCT 44.2 11/08/2016 0915   PLT 282 05/02/2022 1041   PLT 307 06/25/2021 1154   MCV 88.9 05/02/2022 1041   MCV 86 06/25/2021 1154   MCV 85.3 11/08/2016 0915   MCH 28.5 05/02/2022 1041   MCHC 32.0 05/02/2022 1041   RDW 14.2 05/02/2022 1041   RDW 14.3 06/25/2021 1154   RDW 15.1 (H) 11/08/2016 0915   LYMPHSABS 2.4 05/02/2022 1041   LYMPHSABS 1.7 11/08/2016 0915   MONOABS 0.6 05/02/2022 1041   MONOABS 0.7 11/08/2016 0915   EOSABS 0.3 05/02/2022 1041   EOSABS 0.1 11/08/2016 0915   BASOSABS 0.1 05/02/2022 1041   BASOSABS 0.0 11/08/2016 0915    ASSESSMENT AND PLAN: 1. Type 2 diabetes mellitus with obesity (HCC) (Primary) At goal.  Continue metformin and healthy eating habits.  Prescription sent for diabetic testing supplies - POCT glycosylated hemoglobin (Hb A1C) - POCT glucose (manual entry) -  Accu-Chek Softclix Lancets lancets; Use as instructed  Dispense: 100 each; Refill: 12 - Blood Glucose Monitoring Suppl (ACCU-CHEK GUIDE) w/Device KIT; UAD to check BS once a day  Dispense: 1 kit; Refill: 0 - glucose blood (ACCU-CHEK GUIDE TEST) test strip; Use as instructed to check BS once a day  Dispense: 100 each; Refill: 12 - Microalbumin / creatinine urine ratio  2. Diabetes mellitus treated with oral medication (HCC) See #1 above  3. Hypertension associated with diabetes (HCC) At goal. Continue clonidine 0.1 mg twice a day, carvedilol 12.5 mg tablets twice a day, and Hydrochlorothiazide 25 mg daily  4. Hyperlipidemia associated with type 2 diabetes mellitus (HCC) Continue atorvastatin 20 mg daily  5. Chronic radicular pain of lower back Patient taking and tolerating tramadol without significant side effects.  I went over controlled substance prescribing agreement with her.  She was given  the opportunity to read it and sign.  She is agreeable to urine drug screen today.  [place] controlled substance reporting system reviewed.  She has not had any aberrant behaviors.  She benefits from the tramadol in that it helps with pain control and allows her to remain functional  - [id number] 11+Oxyco+Alc+Crt-Bund  6. Controlled substance agreement signed - [id number] 11+Oxyco+Alc+Crt-Bund  7. Chronic narcotic use - [id number] 11+Oxyco+Alc+Crt-Bund  8. Herpes zoster vaccination declined  9. Tetanus, diphtheria, and acellular pertussis (Tdap) vaccination declined    Patient was given the opportunity to ask questions.  Patient verbalized understanding of the plan and was able to repeat key elements of the plan.   This documentation was completed using Paediatric nurse.  Any transcriptional errors are unintentional.  Orders Placed This Encounter  Procedures   Microalbumin / creatinine urine ratio   [id number] 11+Oxyco+Alc+Crt-Bund   POCT glycosylated hemoglobin (Hb A1C)    POCT glucose (manual entry)     Requested Prescriptions   Signed Prescriptions Disp Refills   Accu-Chek Softclix Lancets lancets 100 each 12    Sig: Use as instructed   Blood Glucose Monitoring Suppl (ACCU-CHEK GUIDE) w/Device KIT 1 kit 0    Sig: UAD to check BS once a day   glucose blood (ACCU-CHEK GUIDE TEST) test strip 100 each 12    Sig: Use as instructed to check BS once a day    Return in about 4 months (around 07/08/2023) for Medicare Wellness Visit in 1-2   wks with CMA.  Jonah Blue, MD, FACP

## 2023-03-11 LAB — MICROALBUMIN / CREATININE URINE RATIO
Creatinine, Urine: 161.4 mg/dL
Microalb/Creat Ratio: 3 mg/g{creat} (ref 0–29)
Microalbumin, Urine: 5.1 ug/mL

## 2023-03-14 LAB — DRUG SCREEN 764883 11+OXYCO+ALC+CRT-BUND
Amphetamines, Urine: NEGATIVE ng/mL
BENZODIAZ UR QL: NEGATIVE ng/mL
Barbiturate: NEGATIVE ng/mL
Cannabinoid Quant, Ur: NEGATIVE ng/mL
Cocaine (Metabolite): NEGATIVE ng/mL
Creatinine: 145.4 mg/dL (ref 20.0–300.0)
Ethanol: NEGATIVE %
Meperidine: NEGATIVE ng/mL
Methadone Screen, Urine: NEGATIVE ng/mL
OPIATE SCREEN URINE: NEGATIVE ng/mL
Oxycodone/Oxymorphone, Urine: NEGATIVE ng/mL
Phencyclidine: NEGATIVE ng/mL
Propoxyphene: NEGATIVE ng/mL
pH, Urine: 5 (ref 4.5–8.9)

## 2023-03-14 LAB — TRAMADOL GC/MS, URINE
Tramadol gc/ms Conf: >10000 ng/mL
Tramadol: POSITIVE — AB

## 2023-03-21 ENCOUNTER — Other Ambulatory Visit: Payer: Self-pay | Admitting: Physician Assistant

## 2023-03-21 ENCOUNTER — Other Ambulatory Visit: Payer: Self-pay | Admitting: Internal Medicine

## 2023-03-21 DIAGNOSIS — M5416 Radiculopathy, lumbar region: Secondary | ICD-10-CM

## 2023-03-21 DIAGNOSIS — I10 Essential (primary) hypertension: Secondary | ICD-10-CM

## 2023-03-21 DIAGNOSIS — G8929 Other chronic pain: Secondary | ICD-10-CM

## 2023-03-21 NOTE — Telephone Encounter (Signed)
Requested by interface surescripts. Future visit in 3 months .  Requested Prescriptions  Pending Prescriptions Disp Refills   traMADol (ULTRAM) 50 MG tablet [Pharmacy Med Name: TRAMADOL HCL 50 MG TABLET] 120 tablet 1    Sig: TAKE 1 TAB EVERY 4 (FOUR) HOURS AS NEEDED (EACH PRESCRIPTION TO LAST 1 MONTH).     Not Delegated - Analgesics:  Opioid Agonists Failed - 03/21/2023 10:34 AM      Failed - This refill cannot be delegated      Passed - Urine Drug Screen completed in last 360 days      Passed - Valid encounter within last 3 months    Recent Outpatient Visits           1 week ago Type 2 diabetes mellitus with obesity (HCC)   [place] [facility] - A Dept Of [place]. [facility] Jonah Blue B, MD   4 months ago Type 2 diabetes mellitus with obesity [facility])   [place] [facility] - A Dept Of [place]. [facility] Marcine Matar, MD   8 months ago Primary osteoarthritis of right hip   [place] [facility] [place] - A Dept Of [place]. [facility] [place], [place], [place]   1 year ago Type 2 diabetes mellitus with obesity [facility])   [place] [facility] - A Dept Of [place]. [facility] Marcine Matar, MD   1 year ago Encounter for [employer] annual wellness exam   [place] [facility] [place] - A Dept Of [place]. [facility] Marcine Matar, MD       Future Appointments             In 3 months Laural Benes Binnie Rail, MD [facility] - A Dept Of Eligha Bridegroom. [facility]             carvedilol (COREG) 6.25 MG tablet [Pharmacy Med Name: CARVEDILOL 6.25 MG TABLET] 270 tablet 0    Sig: TAKE ONE AND A HALF TABLETS BY MOUTH 2 TIMES DAILY WITH A MEAL.     Cardiovascular: Beta Blockers 3 Failed - 03/21/2023 10:34 AM      Failed - AST in normal range and within 360 days    AST  Date Value Ref Range Status  05/02/2022 14 (L) 15 - 41 U/L Final  11/08/2016 24 5 - 34  U/L Final         Passed - Cr in normal range and within 360 days    Creatinine  Date Value Ref Range Status  03/10/2023 145.4 20.0 - 300.0 mg/dL Final  40/98/1191 4.78 (H) 0.44 - 1.00 mg/dL Final  29/56/2130 1.2 (H) 0.6 - 1.1 mg/dL Final   Creatinine, POC  Date Value Ref Range Status  07/02/2016 100mg  mg/dL Final         Passed - ALT in normal range and within 360 days    ALT  Date Value Ref Range Status  05/02/2022 11 0 - 44 U/L Final  11/08/2016 23 0 - 55 U/L Final         Passed - Last BP in normal range    BP Readings from Last 1 Encounters:  03/10/23 104/71         Passed - Last Heart Rate in normal range    Pulse Readings from Last 1 Encounters:  03/10/23 80  Passed - Valid encounter within last 6 months    Recent Outpatient Visits           1 week ago Type 2 diabetes mellitus with obesity (HCC)   [place] [facility] - A Dept Of [place]. [facility] Jonah Blue B, MD   4 months ago Type 2 diabetes mellitus with obesity [facility])   [place] [facility] - A Dept Of [place]. [facility] Marcine Matar, MD   8 months ago Primary osteoarthritis of right hip   [place] [facility] [place] - A Dept Of [place]. [facility] [place], [place], [place]   1 year ago Type 2 diabetes mellitus with obesity [facility])   [place] [facility] - A Dept Of [place]. [facility] Marcine Matar, MD   1 year ago Encounter for [employer] annual wellness exam   [place] [facility] [place] - A Dept Of [place]. [facility] Marcine Matar, MD       Future Appointments             In 3 months Laural Benes Binnie Rail, MD [facility] - A Dept Of Eligha Bridegroom. [facility]

## 2023-03-21 NOTE — Telephone Encounter (Signed)
Requested medication (s) are due for refill today: yes   Requested medication (s) are on the active medication list: yes   Last refill:  01/09/23 #120 1 refills  Future visit scheduled: yes in 3 months   Notes to clinic:  not delegated per protocol. Do you want to refill Rx?     Requested Prescriptions  Pending Prescriptions Disp Refills   traMADol (ULTRAM) 50 MG tablet [Pharmacy Med Name: TRAMADOL HCL 50 MG TABLET] 120 tablet 1    Sig: TAKE 1 TAB EVERY 4 (FOUR) HOURS AS NEEDED (EACH PRESCRIPTION TO LAST 1 MONTH).     Not Delegated - Analgesics:  Opioid Agonists Failed - 03/21/2023 10:34 AM      Failed - This refill cannot be delegated      Passed - Urine Drug Screen completed in last 360 days      Passed - Valid encounter within last 3 months    Recent Outpatient Visits           1 week ago Type 2 diabetes mellitus with obesity (HCC)   [place] [facility] - A Dept Of [place]. [facility] Jonah Blue B, MD   4 months ago Type 2 diabetes mellitus with obesity [facility])   [place] [facility] - A Dept Of [place]. [facility] Marcine Matar, MD   8 months ago Primary osteoarthritis of right hip   [place] [facility] [place] - A Dept Of [place]. [facility] [place], [place], [place]   1 year ago Type 2 diabetes mellitus with obesity [facility])   [place] [facility] - A Dept Of [place]. [facility] Marcine Matar, MD   1 year ago Encounter for [employer] annual wellness exam   [place] [facility] [place] - A Dept Of [place]. [facility] Marcine Matar, MD       Future Appointments             In 3 months Laural Benes Binnie Rail, MD [facility] - A Dept Of Eligha Bridegroom. [facility]            Signed Prescriptions Disp Refills   carvedilol (COREG) 6.25 MG tablet 270 tablet 0    Sig: TAKE ONE AND A HALF TABLETS BY MOUTH 2 TIMES DAILY WITH A MEAL.      Cardiovascular: Beta Blockers 3 Failed - 03/21/2023 10:34 AM      Failed - AST in normal range and within 360 days    AST  Date Value Ref Range Status  05/02/2022 14 (L) 15 - 41 U/L Final  11/08/2016 24 5 - 34 U/L Final         Passed - Cr in normal range and within 360 days    Creatinine  Date Value Ref Range Status  03/10/2023 145.4 20.0 - 300.0 mg/dL Final  16/11/9602 5.40 (H) 0.44 - 1.00 mg/dL Final  98/12/9145 1.2 (H) 0.6 - 1.1 mg/dL Final   Creatinine, POC  Date Value Ref Range Status  07/02/2016 100mg  mg/dL Final         Passed - ALT in normal range and within 360 days    ALT  Date Value Ref Range Status  05/02/2022 11 0 - 44 U/L Final  11/08/2016 23 0 - 55 U/L Final         Passed - Last BP in normal range  BP Readings from Last 1 Encounters:  03/10/23 104/71         Passed - Last Heart Rate in normal range    Pulse Readings from Last 1 Encounters:  03/10/23 80         Passed - Valid encounter within last 6 months    Recent Outpatient Visits           1 week ago Type 2 diabetes mellitus with obesity (HCC)   [place] [facility] [place] - A Dept Of [place]. [facility] Jonah Blue B, MD   4 months ago Type 2 diabetes mellitus with obesity [facility])   [place] [facility] - A Dept Of [place]. [facility] Marcine Matar, MD   8 months ago Primary osteoarthritis of right hip   [place] [facility] [place] - A Dept Of [place]. [facility] [place], [place], [place]   1 year ago Type 2 diabetes mellitus with obesity [facility])   [place] [facility] - A Dept Of [place]. [facility] Marcine Matar, MD   1 year ago Encounter for [employer] annual wellness exam   [place] [facility] [place] - A Dept Of [place]. [facility] Marcine Matar, MD       Future Appointments             In 3 months Laural Benes Binnie Rail, MD [facility] - A Dept Of Eligha Bridegroom. [facility]

## 2023-03-21 NOTE — Telephone Encounter (Signed)
Requested medication (s) are due for refill today: yes   Requested medication (s) are on the active medication list: yes   Last refill:  12/09/22 #360 0 refills   Future visit scheduled: yes in 3 months   Notes to clinic:  no refills remain. Do you want to refill Rx?     Requested Prescriptions  Pending Prescriptions Disp Refills   gabapentin (NEURONTIN) 300 MG capsule [Pharmacy Med Name: GABAPENTIN 300 MG CAPSULE] 360 capsule 0    Sig: TAKE 2 CAPSULES BY MOUTH TWICE A DAY     Neurology: Anticonvulsants - gabapentin Passed - 03/21/2023  9:56 AM      Passed - Cr in normal range and within 360 days    Creatinine  Date Value Ref Range Status  03/10/2023 145.4 20.0 - 300.0 mg/dL Final  40/98/1191 4.78 (H) 0.44 - 1.00 mg/dL Final  29/56/2130 1.2 (H) 0.6 - 1.1 mg/dL Final   Creatinine, POC  Date Value Ref Range Status  07/02/2016 100mg  mg/dL Final         Passed - Completed PHQ-2 or PHQ-9 in the last 360 days      Passed - Valid encounter within last 12 months    Recent Outpatient Visits           1 week ago Type 2 diabetes mellitus with obesity (HCC)   [place] [facility] - A Dept Of [place]. [facility] Jonah Blue B, MD   4 months ago Type 2 diabetes mellitus with obesity [facility])   [place] [facility] - A Dept Of [place]. [facility] Marcine Matar, MD   8 months ago Primary osteoarthritis of right hip   [place] [facility] [place] - A Dept Of [place]. [facility] [place], [place], [place]   1 year ago Type 2 diabetes mellitus with obesity [facility])   [place] [facility] - A Dept Of [place]. [facility] Marcine Matar, MD   1 year ago Encounter for [employer] annual wellness exam    Comm [facility] [place] - A Dept Of . [facility] Marcine Matar, MD       Future Appointments             In 3 months Laural Benes Binnie Rail, MD [facility] -  A Dept Of Eligha Bridegroom. [facility]

## 2023-04-09 ENCOUNTER — Other Ambulatory Visit: Payer: Self-pay | Admitting: Internal Medicine

## 2023-04-09 DIAGNOSIS — Z1231 Encounter for screening mammogram for malignant neoplasm of breast: Secondary | ICD-10-CM

## 2023-04-29 ENCOUNTER — Telehealth: Payer: Self-pay | Admitting: Internal Medicine

## 2023-04-29 ENCOUNTER — Ambulatory Visit: Payer: Medicare Other | Attending: Internal Medicine

## 2023-04-29 VITALS — Ht 65.0 in | Wt 176.0 lb

## 2023-04-29 DIAGNOSIS — Z0000 Encounter for general adult medical examination without abnormal findings: Secondary | ICD-10-CM | POA: Diagnosis not present

## 2023-04-29 DIAGNOSIS — Z5912 Inadequate housing utilities: Secondary | ICD-10-CM | POA: Diagnosis not present

## 2023-04-29 DIAGNOSIS — Z599 Problem related to housing and economic circumstances, unspecified: Secondary | ICD-10-CM

## 2023-04-29 DIAGNOSIS — Z5987 Material hardship due to limited financial resources, not elsewhere classified: Secondary | ICD-10-CM

## 2023-04-29 NOTE — Telephone Encounter (Unsigned)
 Copied from CRM [id number]. Topic: Appointments - Scheduling Inquiry for Clinic >> Apr 29, 2023  1:27 PM Gildardo Pounds wrote: Reason for CRM: Patient needs to reschedule her AWV appointment. Callback number is [phone]

## 2023-04-29 NOTE — Progress Notes (Signed)
 Because this visit was a virtual/telehealth visit,  certain criteria was not obtained, such a blood pressure, CBG if applicable, and timed get up and go. Any medications not marked as "taking" were not mentioned during the medication reconciliation part of the visit. Any vitals not documented were not able to be obtained due to this being a telehealth visit or patient was unable to self-report a recent blood pressure reading due to a lack of equipment at home via telehealth. Vitals that have been documented are verbally provided by the patient.   Subjective:   Rachel Herman is a 73 y.o. who presents for a Medicare Wellness preventive visit.  Visit Complete: Virtual I connected with  Rachel Herman on 04/29/23 by a audio enabled telemedicine application and verified that I am speaking with the correct person using two identifiers.  Patient Location: Home  Provider Location: Office/Clinic  I discussed the limitations of evaluation and management by telemedicine. The patient expressed understanding and agreed to proceed.  Vital Signs: Because this visit was a virtual/telehealth visit, some criteria may be missing or patient reported. Any vitals not documented were not able to be obtained and vitals that have been documented are patient reported.  VideoDeclined- This patient declined Librarian, academic. Therefore the visit was completed with audio only.  Persons Participating in Visit: Patient.  AWV Questionnaire: No: Patient Medicare AWV questionnaire was not completed prior to this visit.  Cardiac Risk Factors include: advanced age (>60men, >63 women);diabetes mellitus;dyslipidemia;family history of premature cardiovascular disease;hypertension     Objective:    Today's Vitals   04/29/23 1611  Weight: 176 lb (79.8 kg)  Height: 5\' 5"  (1.651 m)  PainSc: 10-Worst pain ever  PainLoc: Back   Body mass index is 29.29 kg/m.     04/29/2023    4:13  PM 02/19/2022    8:03 PM 10/29/2021   10:35 AM 01/04/2021    5:21 PM 10/17/2020   10:03 AM 09/14/2019    9:00 AM 03/17/2019    9:30 AM  Advanced Directives  Does Patient Have a Medical Advance Directive? No No No No No No No  Would patient like information on creating a medical advance directive? No - Patient declined  Yes (ED - Information included in AVS) No - Patient declined Yes (Inpatient - patient defers creating a medical advance directive at this time - Information given) Yes (Inpatient - patient defers creating a medical advance directive at this time - Information given)     Current Medications (verified) Outpatient Encounter Medications as of 04/29/2023  Medication Sig   Accu-Chek Softclix Lancets lancets Use as instructed   albuterol (VENTOLIN HFA) 108 (90 Base) MCG/ACT inhaler Inhale 2 puffs into the lungs every 6 (six) hours as needed for wheezing or shortness of breath.   Ascorbic Acid (VITAMIN C) 1000 MG tablet Take 1,000 mg by mouth daily.   aspirin EC 325 MG tablet Take 325 mg by mouth daily.   atorvastatin (LIPITOR) 20 MG tablet TAKE 1 TABLET BY MOUTH DAILY. STOP PRAVASTATIN   Blood Glucose Monitoring Suppl (ACCU-CHEK GUIDE) w/Device KIT UAD to check BS once a day   carvedilol (COREG) 12.5 MG tablet Take 1 tablet (12.5 mg total) by mouth 2 (two) times daily with a meal.   carvedilol (COREG) 6.25 MG tablet TAKE ONE AND A HALF TABLETS BY MOUTH 2 TIMES DAILY WITH A MEAL.   cloNIDine (CATAPRES) 0.1 MG tablet TAKE 1 TABLET BY MOUTH TWICE A DAY  [facility] SENNA PLUS 8.6-50 MG tablet TAKE 2 TABLETS BY MOUTH AT BEDTIME AS NEEDED FOR MILD CONSTIPATION (Patient taking differently: Take 1 tablet by mouth at bedtime.)   diclofenac Sodium (VOLTAREN) 1 % GEL APPLY 2 G TOPICALLY 2 (TWO) TIMES DAILY AS NEEDED.   gabapentin (NEURONTIN) 300 MG capsule TAKE 2 CAPSULES BY MOUTH TWICE A DAY   glucose blood (ACCU-CHEK GUIDE TEST) test strip Use as instructed to check BS once a day   hydrochlorothiazide  (HYDRODIURIL) 25 MG tablet TAKE 1 TABLET BY MOUTH EVERY DAY   metFORMIN (GLUCOPHAGE) 500 MG tablet Take 1 tablet (500 mg total) by mouth 2 (two) times daily with a meal.   Multiple Vitamins-Minerals (MULTIVITAMIN WITH MINERALS) tablet Take 1 tablet by mouth daily. Reported on 03/13/2015   omeprazole (PRILOSEC) 20 MG capsule TAKE 1 CAPSULE (20 MG TOTAL) BY MOUTH 2 (TWO) TIMES DAILY BEFORE A MEAL.   potassium chloride (KLOR-CON) 10 MEQ tablet TAKE 1 TABLET BY MOUTH EVERY DAY   traMADol (ULTRAM) 50 MG tablet TAKE 1 TAB EVERY 4 (FOUR) HOURS AS NEEDED (EACH PRESCRIPTION TO LAST 1 MONTH).   No facility-administered encounter medications on file as of 04/29/2023.    Allergies (verified) Patient has no known allergies.   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   Diabetes mellitus (HCC) 09/16/13   Diagnosed on 09/16/13; HgA1C was 7.2/metformin   Dizziness    GERD (gastroesophageal reflux disease)    Headache    Hyperlipidemia    Hypertension 2014   Low back pain    Obesity, morbid, BMI 40.0-49.9 (HCC)    PONV (postoperative nausea and vomiting)    S/P radiation therapy 04/05/14-05/20/14   left breast 60.4Gy totaldose   Past Surgical History:  Procedure Laterality Date   ABDOMINAL HYSTERECTOMY N/A 09/20/2013   Procedure: HYSTERECTOMY ABDOMINAL;  Surgeon: Tereso Newcomer, MD;  Location: [facility];  Service: Gynecology;  Laterality: N/A;   BREAST LUMPECTOMY Left 2015   radiation   BREAST LUMPECTOMY WITH AXILLARY LYMPH NODE BIOPSY Left 12/29/13   left breast    CATARACT EXTRACTION Right    LAPAROSCOPIC ASSISTED VAGINAL HYSTERECTOMY  09/20/2013   Procedure: LAPAROSCOPIC ASSISTED VAGINAL HYSTERECTOMY;  Surgeon: Tereso Newcomer, MD;  Location: [facility];  Service: Gynecology;;  PT WAS EXAMINED WHILE UP IN [place] AND IT WAS DECIDED TO OPEN PT DUE TO LARGE MASS   LUMBAR LAMINECTOMY/DECOMPRESSION MICRODISCECTOMY Right 12/14/2014    Procedure: Right Lumbar three-four microdiskectomy;  Surgeon: Tressie Stalker, MD;  Location: [facility];  Service: Neurosurgery;  Laterality: Right;  Right Lumbar three-four microdiskectomy   RADIOACTIVE SEED GUIDED PARTIAL MASTECTOMY WITH AXILLARY SENTINEL LYMPH NODE BIOPSY Left 12/29/2013   Procedure: LEFT BREAST RADIOACTIVE SEED LOCALIZED LUMPECTOMY WITH SENTINEL LYMPH NODE MAPPING;  Surgeon: Harriette Bouillon, MD;  Location: [facility];  Service: General;  Laterality: Left;   RE-EXCISION OF BREAST LUMPECTOMY Left 03/02/2014   Procedure: RE-EXCISION OF LEFT BREAST LUMPECTOMY;  Surgeon: Harriette Bouillon, MD;  Location: [facility];  Service: General;  Laterality: Left;   SALPINGOOPHORECTOMY  09/20/2013   Procedure: SALPINGO OOPHORECTOMY;  Surgeon: Tereso Newcomer, MD;  Location: [facility];  Service: Gynecology;;   Family History  Problem Relation Age of Onset   Diabetes Mother    Multiple myeloma Mother 81   Hearing loss Mother    Diabetes Brother    Cancer Brother 32  prostate cancer    Diabetes Maternal Aunt    Leukemia Maternal Aunt        dx in her 6s   Alcoholism Brother    Heart attack Father    Diabetes Sister    Diabetes Son    Social History   Socioeconomic History   Marital status: Married    Spouse name: Laurene Melendrez    Number of children: 3   Years of education: 12   Highest education level: Not on file  Occupational History    Employer: UNEMPLOYED  Tobacco Use   Smoking status: Never   Smokeless tobacco: Never  Vaping Use   Vaping status: Never Used  Substance and Sexual Activity   Alcohol use: No   Drug use: No   Sexual activity: Not Currently    Birth control/protection: Post-menopausal  Other Topics Concern   Not on file  Social History Narrative   Married to [employer] in 1986.    Has 3 children from previous marriage, 1 in [place], 1 in [place], 1 in prison ([place]).    Lives with husband.     Right-handed.   1 cup caffeine per day.   Social Drivers of Corporate investment banker Strain: High Risk (04/29/2023)   Overall Financial Resource Strain (CARDIA)    Difficulty of Paying Living Expenses: Very hard  Food Insecurity: No Food Insecurity (04/29/2023)   Hunger Vital Sign    Worried About Running Out of Food in the Last Year: Never true    Ran Out of Food in the Last Year: Never true  Recent Concern: Food Insecurity - Food Insecurity Present (03/10/2023)   Hunger Vital Sign    Worried About Running Out of Food in the Last Year: Sometimes true    Ran Out of Food in the Last Year: Sometimes true  Transportation Needs: No Transportation Needs (04/29/2023)   PRAPARE - Administrator, Civil Service (Medical): No    Lack of Transportation (Non-Medical): No  Physical Activity: Sufficiently Active (04/29/2023)   Exercise Vital Sign    Days of Exercise per Week: 5 days    Minutes of Exercise per Session: 30 min  Stress: Stress Concern Present (04/29/2023)   [employer] of Occupational Health - Occupational Stress Questionnaire    Feeling of Stress : To some extent  Social Connections: Moderately Integrated (04/29/2023)   Social Connection and Isolation Panel [NHANES]    Frequency of Communication with Friends and Family: More than three times a week    Frequency of Social Gatherings with Friends and Family: Once a week    Attends Religious Services: More than 4 times per year    Active Member of [employer] or Organizations: Yes    Attends Banker Meetings: More than 4 times per year    Marital Status: Widowed    Tobacco Counseling Counseling given: Not Answered    Clinical Intake:  Pre-visit preparation completed: Yes  Pain : 0-10 Pain Score: 10-Worst pain ever (TRAMADOL) Pain Type: Chronic pain Pain Location: Back     BMI - recorded: 29.29 Nutritional Status: BMI 25 -29 Overweight Nutritional Risks: None Diabetes: Yes CBG done?: No Did  pt. bring in CBG monitor from home?: No  Lab Results  Component Value Date   HGBA1C 6.0 03/10/2023   HGBA1C 5.9 11/07/2022   HGBA1C 6.2 (H) 07/03/2022     How often do you need to have someone help you when you read instructions, pamphlets, or other  written materials from your doctor or pharmacy?: 1 - Never What is the last grade level you completed in school?: HSG  Interpreter Needed?: No  Information entered by :: Susie Cassette, LPN.   Activities of Daily Living     04/29/2023    4:15 PM  In your present state of health, do you have any difficulty performing the following activities:  Hearing? 0  Vision? 0  Difficulty concentrating or making decisions? 0  Walking or climbing stairs? 0  Dressing or bathing? 0  Doing errands, shopping? 0  Preparing Food and eating ? N  Using the Toilet? N  In the past six months, have you accidently leaked urine? N  Do you have problems with loss of bowel control? N  Managing your Medications? N  Managing your Finances? N  Housekeeping or managing your Housekeeping? N    Patient Care Team: Marcine Matar, MD as PCP - General (Internal Medicine) Dessa Phi, MD as Consulting Physician (Family Medicine) Harriette Bouillon, MD as Consulting Physician (General Surgery) Malachy Mood, MD as Consulting Physician (Hematology) Dorothy Puffer, MD as Consulting Physician (Radiation Oncology) Olivia Canter, MD as Consulting Physician (Ophthalmology)  Indicate any recent Medical Services you may have received from other than [facility] providers in the past year (date may be approximate).     Assessment:   This is a routine wellness examination for Rachel Herman.  Hearing/Vision screen Hearing Screening - Comments:: Denies hearing difficulties.   Vision Screening - Comments:: Wears rx glasses - up to date with routine eye exams with R. Fabian Sharp, MD.    Goals Addressed             This Visit's Progress    Client understands the  importance of follow-up with providers by attending scheduled visits         Depression Screen     04/29/2023    4:14 PM 03/10/2023   10:44 AM 11/07/2022    9:12 AM 07/03/2022    8:48 AM 03/01/2022    9:13 AM 10/29/2021   10:35 AM 06/25/2021   11:07 AM  PHQ 2/9 Scores  PHQ - 2 Score 0 0 1 1 4  0 3  PHQ- 9 Score 8 4 3 9 12  11     Fall Risk     04/29/2023    4:14 PM 07/03/2022    8:48 AM 10/29/2021   10:35 AM 06/25/2021   11:08 AM 10/17/2020   10:03 AM  Fall Risk   Falls in the past year? 0 0 0 0 0  Number falls in past yr: 0 0 0 0 0  Injury with Fall? 0 0 0 0 0  Risk for fall due to : No Fall Risks No Fall Risks No Fall Risks  No Fall Risks  Follow up Falls prevention discussed;Falls evaluation completed        MEDICARE RISK AT HOME:  Medicare Risk at Home Any stairs in or around the home?: No If so, are there any without handrails?: No Home free of loose throw rugs in walkways, pet beds, electrical cords, etc?: Yes Adequate lighting in your home to reduce risk of falls?: Yes Life alert?: No Use of a cane, walker or w/c?: Yes Grab bars in the bathroom?: No Shower chair or bench in shower?: Yes Elevated toilet seat or a handicapped toilet?: Yes  TIMED UP AND GO:  Was the test performed?  No  Cognitive Function: 6CIT completed    04/29/2023    4:16  PM 10/17/2020   10:04 AM 09/14/2019    9:02 AM  MMSE - Mini Mental State Exam  Not completed: Unable to complete    Orientation to time  5 5  Orientation to Place  5 5  Registration  3 3  Attention/ Calculation  5 5  Recall  3 3  Language- name 2 objects  2 2  Language- repeat  0 1  Language- follow 3 step command  3 3  Language- read & follow direction  1 1  Write a sentence  1 1  Copy design  0 0  Total score  28 29        04/29/2023    4:16 PM  6CIT Screen  What Year? 0 points  What month? 0 points  What time? 0 points  Count back from 20 0 points  Months in reverse 0 points  Repeat phrase 0 points  Total  Score 0 points    Immunizations Immunization History  Administered Date(s) Administered   Fluad Quad(high Dose 65+) 10/22/2018, 10/29/2021   Fluad Trivalent(High Dose 65+) 11/07/2022   Influenza, High Dose Seasonal PF 10/22/2018   Influenza,inj,Quad PF,6+ Mos 12/26/2015, 11/01/2016, 10/23/2017, 10/18/2019, 10/17/2020   Influenza-Unspecified 12/26/2015, 11/01/2016, 10/23/2017   PFIZER(Purple Top)SARS-COV-2 Vaccination 04/05/2019, 04/28/2019, 02/15/2020   Tdap 03/30/2014    Screening Tests Health Maintenance  Topic Date Due   COVID-19 Vaccine (4 - 2024-25 season) 10/06/2022   Diabetic kidney evaluation - eGFR measurement  05/02/2023   Zoster Vaccines- Shingrix (1 of 2) 06/07/2023 (Originally 12/29/1969)   Pneumonia Vaccine 37+ Years old (1 of 2 - PCV) 03/09/2024 (Originally 12/30/1956)   HEMOGLOBIN A1C  09/07/2023   Colonoscopy  09/15/2023   FOOT EXAM  11/07/2023   OPHTHALMOLOGY EXAM  12/17/2023   Diabetic kidney evaluation - Urine ACR  03/09/2024   DTaP/Tdap/Td (2 - Td or Tdap) 03/30/2024   MAMMOGRAM  04/24/2024   Medicare Annual Wellness (AWV)  04/28/2024   INFLUENZA VACCINE  Completed   DEXA SCAN  Completed   Hepatitis C Screening  Completed   HPV VACCINES  Aged Out   COLON CANCER SCREENING ANNUAL FOBT  Discontinued    Health Maintenance  Health Maintenance Due  Topic Date Due   COVID-19 Vaccine (4 - 2024-25 season) 10/06/2022   Diabetic kidney evaluation - eGFR measurement  05/02/2023   Health Maintenance Items Addressed: Yes  Additional Screening:  Vision Screening: Recommended annual ophthalmology exams for early detection of glaucoma and other disorders of the eye.  Dental Screening: Recommended annual dental exams for proper oral hygiene  Community Resource Referral / Chronic Care Management: CRR required this visit?  No   CCM required this visit?  No     Plan:     I have personally reviewed and noted the following in the patient's chart:    Medical and social history Use of alcohol, tobacco or illicit drugs  Current medications and supplements including opioid prescriptions. Patient is not currently taking opioid prescriptions. Functional ability and status Nutritional status Physical activity Advanced directives List of other physicians Hospitalizations, surgeries, and ER visits in previous 12 months Vitals Screenings to include cognitive, depression, and falls Referrals and appointments  In addition, I have reviewed and discussed with patient certain preventive protocols, quality metrics, and best practice recommendations. A written personalized care plan for preventive services as well as general preventive health recommendations were provided to patient.     Mickeal Needy, LPN   [id number]   After Visit  Summary: (MyChart) Due to this being a telephonic visit, the after visit summary with patients personalized plan was offered to patient via MyChart   Notes: Nothing significant to report at this time.

## 2023-04-29 NOTE — Patient Instructions (Addendum)
 Rachel Herman , Thank you for taking time to come for your Medicare Wellness Visit. I appreciate your ongoing commitment to your health goals. Please review the following plan we discussed and let me know if I can assist you in the future.   Referrals/Orders/Follow-Ups/Clinician Recommendations: Keep maintaining your health by keeping your appointments with Dr. Laural Benes and any specialists that you may see.  Call us if you need anything.  Have a great year!!!!  This is a list of the screening recommended for you and due dates:  Health Maintenance  Topic Date Due   COVID-19 Vaccine (4 - 2024-25 season) 10/06/2022   Yearly kidney function blood test for diabetes  05/02/2023   Zoster (Shingles) Vaccine (1 of 2) 06/07/2023*   Pneumonia Vaccine (1 of 2 - PCV) 03/09/2024*   Hemoglobin A1C  09/07/2023   Colon Cancer Screening  09/15/2023   Complete foot exam   11/07/2023   Eye exam for diabetics  12/17/2023   Yearly kidney health urinalysis for diabetes  03/09/2024   DTaP/Tdap/Td vaccine (2 - Td or Tdap) 03/30/2024   Mammogram  04/24/2024   Medicare Annual Wellness Visit  04/28/2024   Flu Shot  Completed   DEXA scan (bone density measurement)  Completed   Hepatitis C Screening  Completed   HPV Vaccine  Aged Out   Stool Blood Test  Discontinued  *Topic was postponed. The date shown is not the original due date.    Advanced directives: (Declined) Advance directive discussed with you today. Even though you declined this today, please call our office should you change your mind, and we can give you the proper paperwork for you to fill out.  Next Medicare Annual Wellness Visit scheduled for next year: Yes

## 2023-04-30 ENCOUNTER — Ambulatory Visit
Admission: RE | Admit: 2023-04-30 | Discharge: 2023-04-30 | Disposition: A | Discharge status: Home / Self Care | Source: Ambulatory Visit | Attending: Internal Medicine | Admitting: Internal Medicine

## 2023-04-30 DIAGNOSIS — Z1231 Encounter for screening mammogram for malignant neoplasm of breast: Secondary | ICD-10-CM | POA: Diagnosis not present

## 2023-05-01 ENCOUNTER — Telehealth: Payer: Self-pay | Admitting: *Deleted

## 2023-05-01 NOTE — Progress Notes (Signed)
 Complex Care Management Note Care Guide Note  05/01/2023 Name: Rachel Herman MRN: [id number] DOB: 06-18-1950   Complex Care Management Outreach Attempts: An unsuccessful telephone outreach was attempted today to offer the patient information about available complex care management services.  Follow Up Plan:  Additional outreach attempts will be made to offer the patient complex care management information and services.   Encounter Outcome:  No Answer  Gwenevere Ghazi  [facility]  [facility], [facility] Guide  Direct Dial: [phone]  Fax [phone]

## 2023-05-02 ENCOUNTER — Other Ambulatory Visit: Payer: Self-pay | Admitting: Internal Medicine

## 2023-05-02 ENCOUNTER — Other Ambulatory Visit: Payer: Self-pay

## 2023-05-02 DIAGNOSIS — I10 Essential (primary) hypertension: Secondary | ICD-10-CM

## 2023-05-02 DIAGNOSIS — C50212 Malignant neoplasm of upper-inner quadrant of left female breast: Secondary | ICD-10-CM

## 2023-05-03 NOTE — Progress Notes (Unsigned)
 Patient Care Team: Marcine Matar, MD as PCP - General (Internal Medicine) Dessa Phi, MD as Consulting Physician (Family Medicine) Harriette Bouillon, MD as Consulting Physician (General Surgery) Malachy Mood, MD as Consulting Physician (Hematology) Dorothy Puffer, MD as Consulting Physician (Radiation Oncology) Olivia Canter, MD as Consulting Physician (Ophthalmology)   CHIEF COMPLAINT: Follow up left breast cancer   Oncology History Overview Note  Malignant neoplasm of upper inner quadrant of female breast   Staging form: Breast, AJCC 7th Edition     Clinical: Stage IA (T1c, N0, M0) - Unsigned     Pathologic: No stage assigned - Unsigned     Breast cancer of upper-inner quadrant of left female breast (HCC)  11/23/2013 Imaging   Ultrasound shows angulated hypoechoic mass at the left breast 10 o'clock 18 cm from nipple measuring 1.82 x 0.71 x 0.88 cm. Ultrasound of the left axilla is negative.      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    12/29/2013 Surgery   Left lumpectomy with close (<0.1cm) inferior margin   03/02/2014 Surgery   Reexcision for close margin, path negative for malignancy.   04/05/2014 - 05/20/2014 Radiation Therapy   adjuvant breast radiation    05/27/2014 Imaging   Bone density scan: normal    06/07/2014 - 03/2020 Anti-estrogen oral therapy   Exemestane 25mg  daily, started on 06/07/2014. She stopped in Nov 2018 due to high copay. Changed to anastrozole on 05/09/2017. Completed in 03/2020.    06/20/2015 Imaging   Bone scan 06/20/2015 IMPRESSION: No evidence of metastatic disease. Mild increased activity dorsal aspect right midfoot probable degenerative in nature. Clinical correlation is necessary.   02/29/2016 Mammogram   MM DIAG BREAST TOMO BIALTERAL 02/29/16 IMPRESSION: Stable left breast lumpectomy site. No mammographic evidence of malignancy in the bilateral  breasts.   03/03/2017 Mammogram   IMPRESSION: 1. No mammographic evidence of malignancy in either breast. 2. Stable left breast posttreatment changes.      CURRENT THERAPY: Surveillance   INTERVAL HISTORY Ms. Linson returns for follow up as scheduled. Last seen by me 05/02/22  ROS   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   Diabetes mellitus (HCC) 09/16/13   Diagnosed on 09/16/13; HgA1C was 7.2/metformin   Dizziness    GERD (gastroesophageal reflux disease)    Headache    Hyperlipidemia    Hypertension 2014   Low back pain    Obesity, morbid, BMI 40.0-49.9 (HCC)    PONV (postoperative nausea and vomiting)    S/P radiation therapy 04/05/14-05/20/14   left breast 60.4Gy totaldose     Past Surgical History:  Procedure Laterality Date   ABDOMINAL HYSTERECTOMY N/A 09/20/2013   Procedure: HYSTERECTOMY ABDOMINAL;  Surgeon: Tereso Newcomer, MD;  Location: [facility];  Service: Gynecology;  Laterality: N/A;   BREAST LUMPECTOMY Left 2015   radiation   BREAST LUMPECTOMY WITH AXILLARY LYMPH NODE BIOPSY Left 12/29/13   left breast    CATARACT EXTRACTION Right    LAPAROSCOPIC ASSISTED VAGINAL HYSTERECTOMY  09/20/2013   Procedure: LAPAROSCOPIC ASSISTED VAGINAL HYSTERECTOMY;  Surgeon: Tereso Newcomer, MD;  Location: [facility];  Service: Gynecology;;  PT WAS EXAMINED WHILE UP IN [place] AND IT WAS DECIDED TO OPEN PT DUE TO LARGE MASS   LUMBAR LAMINECTOMY/DECOMPRESSION MICRODISCECTOMY Right 12/14/2014   Procedure: Right Lumbar three-four microdiskectomy;  Surgeon: Tressie Stalker, MD;  Location: [facility];  Service: Neurosurgery;  Laterality: Right;  Right Lumbar three-four microdiskectomy   RADIOACTIVE SEED GUIDED PARTIAL MASTECTOMY WITH AXILLARY SENTINEL LYMPH NODE BIOPSY Left 12/29/2013   Procedure: LEFT BREAST RADIOACTIVE SEED LOCALIZED LUMPECTOMY WITH SENTINEL LYMPH NODE MAPPING;  Surgeon: Harriette Bouillon, MD;  Location:  [facility];  Service: General;  Laterality: Left;   RE-EXCISION OF BREAST LUMPECTOMY Left 03/02/2014   Procedure: RE-EXCISION OF LEFT BREAST LUMPECTOMY;  Surgeon: Harriette Bouillon, MD;  Location: [facility];  Service: General;  Laterality: Left;   SALPINGOOPHORECTOMY  09/20/2013   Procedure: SALPINGO OOPHORECTOMY;  Surgeon: Tereso Newcomer, MD;  Location: [facility];  Service: Gynecology;;     Outpatient Encounter Medications as of 05/05/2023  Medication Sig   Accu-Chek Softclix Lancets lancets Use as instructed   albuterol (VENTOLIN HFA) 108 (90 Base) MCG/ACT inhaler Inhale 2 puffs into the lungs every 6 (six) hours as needed for wheezing or shortness of breath.   Ascorbic Acid (VITAMIN C) 1000 MG tablet Take 1,000 mg by mouth daily.   aspirin EC 325 MG tablet Take 325 mg by mouth daily.   atorvastatin (LIPITOR) 20 MG tablet TAKE 1 TABLET BY MOUTH DAILY. STOP PRAVASTATIN   Blood Glucose Monitoring Suppl (ACCU-CHEK GUIDE) w/Device KIT UAD to check BS once a day   carvedilol (COREG) 12.5 MG tablet Take 1 tablet (12.5 mg total) by mouth 2 (two) times daily with a meal.   carvedilol (COREG) 6.25 MG tablet TAKE ONE AND A HALF TABLETS BY MOUTH 2 TIMES DAILY WITH A MEAL.   cloNIDine (CATAPRES) 0.1 MG tablet TAKE 1 TABLET BY MOUTH TWICE A DAY   [facility] SENNA PLUS 8.6-50 MG tablet TAKE 2 TABLETS BY MOUTH AT BEDTIME AS NEEDED FOR MILD CONSTIPATION (Patient taking differently: Take 1 tablet by mouth at bedtime.)   diclofenac Sodium (VOLTAREN) 1 % GEL APPLY 2 G TOPICALLY 2 (TWO) TIMES DAILY AS NEEDED.   gabapentin (NEURONTIN) 300 MG capsule TAKE 2 CAPSULES BY MOUTH TWICE A DAY   glucose blood (ACCU-CHEK GUIDE TEST) test strip Use as instructed to check BS once a day   hydrochlorothiazide (HYDRODIURIL) 25 MG tablet TAKE 1 TABLET BY MOUTH EVERY DAY   metFORMIN (GLUCOPHAGE) 500 MG tablet Take 1 tablet (500 mg total) by mouth 2 (two) times daily with a meal.   Multiple Vitamins-Minerals  (MULTIVITAMIN WITH MINERALS) tablet Take 1 tablet by mouth daily. Reported on 03/13/2015   omeprazole (PRILOSEC) 20 MG capsule TAKE 1 CAPSULE (20 MG TOTAL) BY MOUTH 2 (TWO) TIMES DAILY BEFORE A MEAL.   potassium chloride (KLOR-CON) 10 MEQ tablet TAKE 1 TABLET BY MOUTH EVERY DAY   traMADol (ULTRAM) 50 MG tablet TAKE 1 TAB EVERY 4 (FOUR) HOURS AS NEEDED (EACH PRESCRIPTION TO LAST 1 MONTH).   No facility-administered encounter medications on file as of 05/05/2023.     There were no vitals filed for this visit. There is no height or weight on file to calculate BMI.   PHYSICAL EXAM GENERAL:alert, no distress and comfortable SKIN: no rash  EYES: sclera clear NECK: without mass LYMPH:  no palpable cervical or supraclavicular lymphadenopathy  LUNGS: clear with normal breathing effort HEART: regular rate & rhythm, no lower extremity edema ABDOMEN: abdomen soft, non-tender and normal bowel sounds NEURO: alert & oriented x 3 with fluent speech, no focal motor/sensory deficits Breast exam:  PAC without erythema    CBC    Component Value Date/Time   WBC 9.1 05/02/2022 1041  WBC 10.7 (H) 02/19/2022 2020   RBC 4.60 05/02/2022 1041   HGB 13.1 05/02/2022 1041   HGB 12.8 06/25/2021 1154   HGB 14.5 11/08/2016 0915   HCT 40.9 05/02/2022 1041   HCT 39.9 06/25/2021 1154   HCT 44.2 11/08/2016 0915   PLT 282 05/02/2022 1041   PLT 307 06/25/2021 1154   MCV 88.9 05/02/2022 1041   MCV 86 06/25/2021 1154   MCV 85.3 11/08/2016 0915   MCH 28.5 05/02/2022 1041   MCHC 32.0 05/02/2022 1041   RDW 14.2 05/02/2022 1041   RDW 14.3 06/25/2021 1154   RDW 15.1 (H) 11/08/2016 0915   LYMPHSABS 2.4 05/02/2022 1041   LYMPHSABS 1.7 11/08/2016 0915   MONOABS 0.6 05/02/2022 1041   MONOABS 0.7 11/08/2016 0915   EOSABS 0.3 05/02/2022 1041   EOSABS 0.1 11/08/2016 0915   BASOSABS 0.1 05/02/2022 1041   BASOSABS 0.0 11/08/2016 0915     CMP     Component Value Date/Time   NA 142 05/02/2022 1041   NA 143  03/01/2022 1010   NA 143 11/08/2016 0915   K 3.6 05/02/2022 1041   K 3.0 (LL) 11/08/2016 0915   CL 101 05/02/2022 1041   CO2 31 05/02/2022 1041   CO2 30 (H) 11/08/2016 0915   GLUCOSE 101 (H) 05/02/2022 1041   GLUCOSE 114 11/08/2016 0915   BUN 19 05/02/2022 1041   BUN 20 03/01/2022 1010   BUN 18.9 11/08/2016 0915   CREATININE 1.03 (H) 05/02/2022 1041   CREATININE 1.2 (H) 11/08/2016 0915   CALCIUM 9.1 05/02/2022 1041   CALCIUM 10.2 11/08/2016 0915   PROT 7.2 05/02/2022 1041   PROT 6.9 02/21/2021 1148   PROT 7.9 11/08/2016 0915   ALBUMIN 3.9 05/02/2022 1041   ALBUMIN 4.1 02/21/2021 1148   ALBUMIN 3.9 11/08/2016 0915   AST 14 (L) 05/02/2022 1041   AST 24 11/08/2016 0915   ALT 11 05/02/2022 1041   ALT 23 11/08/2016 0915   ALKPHOS 48 05/02/2022 1041   ALKPHOS 68 11/08/2016 0915   BILITOT 0.6 05/02/2022 1041   BILITOT 0.57 11/08/2016 0915   GFRNONAA 58 (L) 05/02/2022 1041   GFRAA 83 10/18/2019 1200     ASSESSMENT & PLAN:73 year old female    1. 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 Grade 2, Ki-67 73%. -Diagnosed in 12/2013. S/p lumpectomy 12/29/2013 and adjuvant radiation 04/05/2014 - 05/20/2014.  -Oncotype DX recurrence score is 23, intermediate risk; adjuvant chemo was not recommended -She completed adjuvant antiestrogen therapy with Exemestane in 06/2014 - 12/2016 then anastrozole 05/2017 to early 2022. -Mammogram 04/30/23 is benign.    2.  Unintentional weight loss, abdominal cramping, constipation -Ms. Brickhouse has lost weight unintentionally since the tragic passing of 2 daughters in law since December. PCP attributes to depression -She got the flu in 02/2202, went to ED for pain/N/V. Imaging showed sigmoid diverticulosis, cholelithiasis without cholecystitis, small pulmonary nodules which were stable from prior, and an ill-defined hypoechoic focus along the gallbladder fossa -On  today's exam she is TTP in the mid/low abdomen, without discrete mass or palpable abnormality -Given that her symptoms are ongoing over 2 months I recommend she follow-up with her GI Dr. Elnoria Howard, she agrees and will call his office to set up an appointment   3. Bone health -DEXA 04/01/2019 showed lowest T score -0.5, normal  -Continue calcium, vitamin D, and weightbearing exercise  -Repeat in 2024   4. HTN,  DM, HL, obesity, low back, R hip/leg pain -Receiving injections -continue f/u with PCP  -I encouraged her to continue healthy active lifestyle and stay up-to-date on age-appropriate health maintenance         PLAN:  No orders of the defined types were placed in this encounter.     All questions were answered. The patient knows to call the clinic with any problems, questions or concerns. No barriers to learning were detected. I spent *** counseling the patient face to face. The total time spent in the appointment was *** and more than 50% was on counseling, review of test results, and coordination of care.   Santiago Glad, NP-C @DATE @

## 2023-05-05 ENCOUNTER — Inpatient Hospital Stay (HOSPITAL_BASED_OUTPATIENT_CLINIC_OR_DEPARTMENT_OTHER): Payer: Medicare Other | Admitting: Physician Assistant

## 2023-05-05 ENCOUNTER — Inpatient Hospital Stay: Payer: Medicare Other | Attending: Nurse Practitioner

## 2023-05-05 ENCOUNTER — Telehealth: Payer: Self-pay

## 2023-05-05 VITALS — BP 115/64 | HR 71 | Temp 98.6°F | Resp 18 | Ht 65.0 in | Wt 189.1 lb

## 2023-05-05 DIAGNOSIS — E119 Type 2 diabetes mellitus without complications: Secondary | ICD-10-CM | POA: Insufficient documentation

## 2023-05-05 DIAGNOSIS — B370 Candidal stomatitis: Secondary | ICD-10-CM | POA: Diagnosis not present

## 2023-05-05 DIAGNOSIS — C50212 Malignant neoplasm of upper-inner quadrant of left female breast: Secondary | ICD-10-CM

## 2023-05-05 DIAGNOSIS — Z90721 Acquired absence of ovaries, unilateral: Secondary | ICD-10-CM | POA: Insufficient documentation

## 2023-05-05 DIAGNOSIS — Z79899 Other long term (current) drug therapy: Secondary | ICD-10-CM | POA: Insufficient documentation

## 2023-05-05 DIAGNOSIS — Z170 Estrogen receptor positive status [ER+]: Secondary | ICD-10-CM | POA: Insufficient documentation

## 2023-05-05 DIAGNOSIS — I10 Essential (primary) hypertension: Secondary | ICD-10-CM | POA: Diagnosis not present

## 2023-05-05 DIAGNOSIS — E785 Hyperlipidemia, unspecified: Secondary | ICD-10-CM | POA: Diagnosis not present

## 2023-05-05 DIAGNOSIS — Z79811 Long term (current) use of aromatase inhibitors: Secondary | ICD-10-CM | POA: Insufficient documentation

## 2023-05-05 DIAGNOSIS — Z923 Personal history of irradiation: Secondary | ICD-10-CM | POA: Diagnosis not present

## 2023-05-05 DIAGNOSIS — Z1721 Progesterone receptor positive status: Secondary | ICD-10-CM | POA: Diagnosis not present

## 2023-05-05 DIAGNOSIS — Z9012 Acquired absence of left breast and nipple: Secondary | ICD-10-CM | POA: Insufficient documentation

## 2023-05-05 DIAGNOSIS — Z90710 Acquired absence of both cervix and uterus: Secondary | ICD-10-CM | POA: Diagnosis not present

## 2023-05-05 LAB — CBC WITH DIFFERENTIAL (CANCER CENTER ONLY)
Abs Immature Granulocytes: 0.01 10*3/uL (ref 0.00–0.07)
Basophils Absolute: 0.1 10*3/uL (ref 0.0–0.1)
Basophils Relative: 1 %
Eosinophils Absolute: 0.3 10*3/uL (ref 0.0–0.5)
Eosinophils Relative: 4 %
HCT: 39.7 % (ref 36.0–46.0)
Hemoglobin: 12.4 g/dL (ref 12.0–15.0)
Immature Granulocytes: 0 %
Lymphocytes Relative: 39 %
Lymphs Abs: 3.2 10*3/uL (ref 0.7–4.0)
MCH: 28.3 pg (ref 26.0–34.0)
MCHC: 31.2 g/dL (ref 30.0–36.0)
MCV: 90.6 fL (ref 80.0–100.0)
Monocytes Absolute: 0.6 10*3/uL (ref 0.1–1.0)
Monocytes Relative: 7 %
Neutro Abs: 4.2 10*3/uL (ref 1.7–7.7)
Neutrophils Relative %: 49 %
Platelet Count: 264 10*3/uL (ref 150–400)
RBC: 4.38 MIL/uL (ref 3.87–5.11)
RDW: 14.3 % (ref 11.5–15.5)
WBC Count: 8.4 10*3/uL (ref 4.0–10.5)
nRBC: 0 % (ref 0.0–0.2)

## 2023-05-05 LAB — CMP (CANCER CENTER ONLY)
ALT: 10 U/L (ref 0–44)
AST: 14 U/L — ABNORMAL LOW (ref 15–41)
Albumin: 3.8 g/dL (ref 3.5–5.0)
Alkaline Phosphatase: 44 U/L (ref 38–126)
Anion gap: 7 (ref 5–15)
BUN: 24 mg/dL — ABNORMAL HIGH (ref 8–23)
CO2: 31 mmol/L (ref 22–32)
Calcium: 9.5 mg/dL (ref 8.9–10.3)
Chloride: 104 mmol/L (ref 98–111)
Creatinine: 1.17 mg/dL — ABNORMAL HIGH (ref 0.44–1.00)
GFR, Estimated: 49 mL/min — ABNORMAL LOW (ref >60)
Glucose, Bld: 103 mg/dL — ABNORMAL HIGH (ref 70–99)
Potassium: 4.7 mmol/L (ref 3.5–5.1)
Sodium: 142 mmol/L (ref 135–145)
Total Bilirubin: 0.3 mg/dL (ref 0.0–1.2)
Total Protein: 6.9 g/dL (ref 6.5–8.1)

## 2023-05-05 MED ORDER — NYSTATIN 100000 UNIT/ML MT SUSP: 5.0000 mL | Freq: Four times a day (QID) | OROMUCOSAL | 0 refills | Status: DC

## 2023-05-05 NOTE — Telephone Encounter (Signed)
 I attempted to call patient to reschedule  her because her provider is out sick. Her voicemail was full so I was unable to leave a voicemail

## 2023-05-08 NOTE — Progress Notes (Unsigned)
 Complex Care Management Note Care Guide Note  05/08/2023 Name: Rachel Herman MRN: [id number] DOB: Sep 19, 1950   Complex Care Management Outreach Attempts: A second unsuccessful outreach was attempted today to offer the patient with information about available complex care management services.  Follow Up Plan:  Additional outreach attempts will be made to offer the patient complex care management information and services.   Encounter Outcome:  No Answer  Gwenevere Ghazi  [facility]  [facility] Guide  Direct Dial: [phone]  Fax [phone]

## 2023-05-09 NOTE — Progress Notes (Signed)
 Complex Care Management Note Care Guide Note  05/09/2023 Name: Rachel Herman MRN: [id number] DOB: 15-May-1950   Complex Care Management Outreach Attempts: A third unsuccessful outreach was attempted today to offer the patient with information about available complex care management services.  Follow Up Plan:  No further outreach attempts will be made at this time. We have been unable to contact the patient to offer or enroll patient in complex care management services.  Encounter Outcome:  No Answer  Gwenevere Ghazi  [facility]  [facility] Guide  Direct Dial: [phone]  Fax [phone]

## 2023-05-11 ENCOUNTER — Other Ambulatory Visit: Payer: Self-pay | Admitting: Physician Assistant

## 2023-05-11 DIAGNOSIS — E1169 Type 2 diabetes mellitus with other specified complication: Secondary | ICD-10-CM

## 2023-05-12 NOTE — Telephone Encounter (Signed)
 Requested medications are due for refill today.  no  Requested medications are on the active medications list.  yes  Last refill. 11/07/2022 #180 3 rf  Future visit scheduled.   yes  Notes to clinic.  Sig on request does not match sig on med list.    Requested Prescriptions  Pending Prescriptions Disp Refills   metFORMIN (GLUCOPHAGE) 500 MG tablet [Pharmacy Med Name: METFORMIN HCL 500 MG TABLET] 270 tablet 2    Sig: TAKE 1 & 1/2 TABLETS (750 MG) BY MOUTH 2 TIMES DAILY.     Endocrinology:  Diabetes - Biguanides Failed - 05/12/2023  5:09 PM      Failed - Cr in normal range and within 360 days    Creatinine  Date Value Ref Range Status  05/05/2023 1.17 (H) 0.44 - 1.00 mg/dL Final  16/11/9602 540.9 20.0 - 300.0 mg/dL Final  81/19/1478 1.2 (H) 0.6 - 1.1 mg/dL Final   Creatinine, POC  Date Value Ref Range Status  07/02/2016 100mg  mg/dL Final         Failed - eGFR in normal range and within 360 days    GFR calc Af Amer  Date Value Ref Range Status  10/18/2019 83 >59 mL/min/1.73 Final    Comment:    **[facility] currently reports eGFR in compliance with the current**   recommendations of the [employer]. [facility] will   update reporting as new guidelines are published from the NKF-ASN   Task force.    GFR, Estimated  Date Value Ref Range Status  05/05/2023 49 (L) >60 mL/min Final    Comment:    (NOTE) Calculated using the CKD-EPI Creatinine Equation (2021)    eGFR  Date Value Ref Range Status  03/01/2022 65 >59 mL/min/1.73 Final         Failed - B12 Level in normal range and within 720 days    No results found for: "VITAMINB12"       Failed - CBC within normal limits and completed in the last 12 months    WBC  Date Value Ref Range Status  02/19/2022 10.7 (H) 4.0 - 10.5 K/uL Final   WBC Count  Date Value Ref Range Status  05/05/2023 8.4 4.0 - 10.5 K/uL Final   RBC  Date Value Ref Range Status  05/05/2023 4.38 3.87 - 5.11 MIL/uL Final   Hemoglobin   Date Value Ref Range Status  05/05/2023 12.4 12.0 - 15.0 g/dL Final  29/56/2130 86.5 11.1 - 15.9 g/dL Final   HGB  Date Value Ref Range Status  11/08/2016 14.5 11.6 - 15.9 g/dL Final   HCT  Date Value Ref Range Status  05/05/2023 39.7 36.0 - 46.0 % Final  11/08/2016 44.2 34.8 - 46.6 % Final   Hematocrit  Date Value Ref Range Status  06/25/2021 39.9 34.0 - 46.6 % Final   MCHC  Date Value Ref Range Status  05/05/2023 31.2 30.0 - 36.0 g/dL Final   [facility]  Date Value Ref Range Status  05/05/2023 28.3 26.0 - 34.0 pg Final   MCV  Date Value Ref Range Status  05/05/2023 90.6 80.0 - 100.0 fL Final  06/25/2021 86 79 - 97 fL Final  11/08/2016 85.3 79.5 - 101.0 fL Final   No results found for: "PLTCOUNTKUC", "LABPLAT", "POCPLA" RDW  Date Value Ref Range Status  05/05/2023 14.3 11.5 - 15.5 % Final  06/25/2021 14.3 11.7 - 15.4 % Final  11/08/2016 15.1 (H) 11.2 - 14.5 % Final  Passed - HBA1C is between 0 and 7.9 and within 180 days    HbA1c, POC (prediabetic range)  Date Value Ref Range Status  06/08/2019 5.9 5.7 - 6.4 % Final   HbA1c, POC (controlled diabetic range)  Date Value Ref Range Status  03/10/2023 6.0 0.0 - 7.0 % Final         Passed - Valid encounter within last 6 months    Recent Outpatient Visits           2 months ago Type 2 diabetes mellitus with obesity (HCC)   [facility] [facility] Of [place]. [facility] Jonah Blue B, MD   6 months ago Type 2 diabetes mellitus with obesity [facility])   [place] [facility] - A Dept Of [place]. [facility] Marcine Matar, MD   10 months ago Primary osteoarthritis of right hip   [place] [facility] - [facility] Dept Of [place]. [facility] [place], [place], [place]   1 year ago Type 2 diabetes mellitus with obesity [facility])   [place] [facility] - A Dept Of [place]. [facility] Marcine Matar, MD   1 year ago Encounter for  [employer] annual wellness exam   [place] [facility] [place] - A Dept Of . [facility] Marcine Matar, MD       Future Appointments             In 1 month Laural Benes Binnie Rail, MD [facility] [place] - A Dept Of [place]igha Bridegroom. [facility]

## 2023-06-03 ENCOUNTER — Other Ambulatory Visit: Payer: Self-pay | Admitting: Internal Medicine

## 2023-06-03 DIAGNOSIS — G8929 Other chronic pain: Secondary | ICD-10-CM

## 2023-07-08 ENCOUNTER — Ambulatory Visit: Payer: Medicare Other | Admitting: Internal Medicine

## 2023-08-02 ENCOUNTER — Other Ambulatory Visit: Payer: Self-pay | Admitting: Internal Medicine

## 2023-08-02 DIAGNOSIS — E785 Hyperlipidemia, unspecified: Secondary | ICD-10-CM

## 2023-08-02 DIAGNOSIS — E876 Hypokalemia: Secondary | ICD-10-CM

## 2023-08-02 DIAGNOSIS — G8929 Other chronic pain: Secondary | ICD-10-CM

## 2023-08-02 DIAGNOSIS — I152 Hypertension secondary to endocrine disorders: Secondary | ICD-10-CM

## 2023-08-02 DIAGNOSIS — K219 Gastro-esophageal reflux disease without esophagitis: Secondary | ICD-10-CM

## 2023-08-02 DIAGNOSIS — M5416 Radiculopathy, lumbar region: Secondary | ICD-10-CM

## 2023-08-02 DIAGNOSIS — I10 Essential (primary) hypertension: Secondary | ICD-10-CM

## 2023-08-06 ENCOUNTER — Other Ambulatory Visit: Payer: Self-pay | Admitting: Internal Medicine

## 2023-08-06 DIAGNOSIS — G8929 Other chronic pain: Secondary | ICD-10-CM

## 2023-08-06 NOTE — Telephone Encounter (Signed)
 Copied from CRM [id number]. Topic: Clinical - Medication Refill >> Aug 06, 2023 10:13 AM Larissa S wrote: Medication: traMADol  (ULTRAM ) 50 MG tablet  Has the patient contacted their pharmacy? Yes, states medication was not sent.  (Agent: If no, request that the patient contact the pharmacy for the refill. If patient does not wish to contact the pharmacy document the reason why and proceed with request.) (Agent: If yes, when and what did the pharmacy advise?)  This is the patient's preferred pharmacy:  [id number] [place], [place] - [address] [place] [address] Phone: [phone] Fax: [phone]  Is this the correct pharmacy for this prescription? Yes If no, delete pharmacy and type the correct one.   Has the prescription been filled recently? Yes  Is the patient out of the medication? Yes  Has the patient been seen for an appointment in the last year OR does the patient have an upcoming appointment? Yes  Can we respond through MyChart? No  Agent: Please be advised that Rx refills may take up to 3 business days. We ask that you follow-up with your pharmacy.

## 2023-08-08 NOTE — Telephone Encounter (Signed)
 Requested medications are due for refill today.  no  Requested medications are on the active medications list.  yes  Last refill. 08/06/2023  Future visit scheduled.   yes  Notes to clinic.  Refusal not delegated.    Requested Prescriptions  Pending Prescriptions Disp Refills   traMADol  (ULTRAM ) 50 MG tablet 120 tablet 1    Sig: TAKE 1 TABLET EVERY 4 HOURS AS NEEDED (EACH PRESCRIPTION TO LAST 1 MONTH).     Not Delegated - Analgesics:  Opioid Agonists Failed - 08/08/2023  3:29 PM      Failed - This refill cannot be delegated      Failed - Valid encounter within last 3 months    Recent Outpatient Visits           5 months ago Type 2 diabetes mellitus with obesity (HCC)   [place] [facility] Of [place]. [facility] Vicci Sober B, MD   9 months ago Type 2 diabetes mellitus with obesity [facility])   [place] [facility] - A Dept Of [place]. [facility] Vicci Sober NOVAK, MD   1 year ago Primary osteoarthritis of right hip   [place] [facility] Dept Of [place]. [facility] [place], [place], [place]   1 year ago Type 2 diabetes mellitus with obesity [facility])   [place] [facility] - A Dept Of [place]. [facility] Vicci Sober NOVAK, MD   1 year ago Encounter for [employer] annual wellness exam   [place] [facility] [place] - A Dept Of . [facility] Vicci Sober NOVAK, MD       Future Appointments             In 1 week Vicci Sober NOVAK, MD [facility] [place] - A Dept Of [place]. [facility]            Passed - Urine Drug Screen completed in last 360 days

## 2023-08-08 NOTE — Telephone Encounter (Signed)
 Refill sent on 08/06/23 @0835  receipt received, patient says not received by pharmacy, will need to call pharmacy to verify receipt.

## 2023-08-15 ENCOUNTER — Ambulatory Visit: Attending: Internal Medicine | Admitting: Internal Medicine

## 2023-08-15 ENCOUNTER — Encounter: Payer: Self-pay | Admitting: Internal Medicine

## 2023-08-15 VITALS — BP 99/68 | HR 66 | Temp 98.3°F | Ht 65.0 in | Wt 187.0 lb

## 2023-08-15 DIAGNOSIS — Z6831 Body mass index (BMI) 31.0-31.9, adult: Secondary | ICD-10-CM

## 2023-08-15 DIAGNOSIS — E669 Obesity, unspecified: Secondary | ICD-10-CM | POA: Diagnosis not present

## 2023-08-15 DIAGNOSIS — Z1211 Encounter for screening for malignant neoplasm of colon: Secondary | ICD-10-CM

## 2023-08-15 DIAGNOSIS — I10 Essential (primary) hypertension: Secondary | ICD-10-CM

## 2023-08-15 DIAGNOSIS — Z7984 Long term (current) use of oral hypoglycemic drugs: Secondary | ICD-10-CM | POA: Diagnosis not present

## 2023-08-15 DIAGNOSIS — G8929 Other chronic pain: Secondary | ICD-10-CM | POA: Diagnosis not present

## 2023-08-15 DIAGNOSIS — E785 Hyperlipidemia, unspecified: Secondary | ICD-10-CM

## 2023-08-15 DIAGNOSIS — K219 Gastro-esophageal reflux disease without esophagitis: Secondary | ICD-10-CM | POA: Diagnosis not present

## 2023-08-15 DIAGNOSIS — M5416 Radiculopathy, lumbar region: Secondary | ICD-10-CM | POA: Diagnosis not present

## 2023-08-15 DIAGNOSIS — E1169 Type 2 diabetes mellitus with other specified complication: Secondary | ICD-10-CM

## 2023-08-15 DIAGNOSIS — E119 Type 2 diabetes mellitus without complications: Secondary | ICD-10-CM | POA: Diagnosis not present

## 2023-08-15 DIAGNOSIS — I152 Hypertension secondary to endocrine disorders: Secondary | ICD-10-CM

## 2023-08-15 LAB — POCT GLYCOSYLATED HEMOGLOBIN (HGB A1C): HbA1c, POC (controlled diabetic range): 5.8 % (ref 0.0–7.0)

## 2023-08-15 LAB — GLUCOSE, POCT (MANUAL RESULT ENTRY): POC Glucose: 105 mg/dL — AB (ref 70–99)

## 2023-08-15 MED ORDER — CLONIDINE HCL 0.1 MG PO TABS: 0.1000 mg | ORAL_TABLET | Freq: Two times a day (BID) | ORAL | 1 refills | Status: DC

## 2023-08-15 MED ORDER — CARVEDILOL 12.5 MG PO TABS: 12.5000 mg | ORAL_TABLET | Freq: Two times a day (BID) | ORAL | 2 refills | Status: AC

## 2023-08-15 MED ORDER — POTASSIUM CHLORIDE ER 10 MEQ PO TBCR: 10.0000 meq | EXTENDED_RELEASE_TABLET | Freq: Every day | ORAL | 1 refills | Status: DC

## 2023-08-15 MED ORDER — HYDROCHLOROTHIAZIDE 25 MG PO TABS: 25.0000 mg | ORAL_TABLET | Freq: Every day | ORAL | 1 refills | Status: DC

## 2023-08-15 MED ORDER — OMEPRAZOLE 20 MG PO CPDR: 20.0000 mg | DELAYED_RELEASE_CAPSULE | Freq: Two times a day (BID) | ORAL | 1 refills | Status: DC

## 2023-08-15 MED ORDER — GABAPENTIN 300 MG PO CAPS: 600.0000 mg | ORAL_CAPSULE | Freq: Two times a day (BID) | ORAL | 1 refills | Status: DC

## 2023-08-15 MED ORDER — ATORVASTATIN CALCIUM 20 MG PO TABS: ORAL_TABLET | ORAL | 1 refills | Status: AC

## 2023-08-15 MED ORDER — GABAPENTIN 300 MG PO CAPS: ORAL_CAPSULE | ORAL | 1 refills | Status: DC

## 2023-08-15 NOTE — Progress Notes (Signed)
 Patient ID: Rachel Herman, female    DOB: [birth year]  MRN: [id number]  CC: Diabetes (DM f/u. Med refill. Layvonne referral to [facility] [place]/Sleeping 3-4 hrs per night )   Subjective: Rachel Herman is a 73 y.o. female who presents for chronic ds management. Lynwood, her fiance, is with her. 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 Ileana), vaginal prolapse, CKD 3, RT lung nodule (CTA 10/2018 - stable).    Discussed the use of AI scribe software for clinical note transcription with the patient, who gave verbal consent to proceed.  History of Present Illness Rachel Herman is a 73 year old female with diabetes, hypertension, hyperlipidemia, and chronic back pain who presents for a follow-up visit.  DM: Results for orders placed or performed in visit on 08/15/23  POCT glucose (manual entry)   Collection Time: 08/15/23 10:22 AM  Result Value Ref Range   POC Glucose 105 (A) 70 - 99 mg/dl  POCT glycosylated hemoglobin (Hb A1C)   Collection Time: 08/15/23 10:28 AM  Result Value Ref Range   Hemoglobin A1C     HbA1c POC (<> result, manual entry)     HbA1c, POC (prediabetic range)     HbA1c, POC (controlled diabetic range) 5.8 0.0 - 7.0 %  Diabetes management is stable with an A1c of 5.8, down from 6.0 previously. She continues to take metformin  500 mg twice daily and checks her blood sugar occasionally, with readings ranging from 85 to 117 mg/dL. She drinks a 16-ounce bottle of [employer] daily and struggles with increasing her water intake beyound one small bottle daily.  Hypertension: managed with clonidine  0.1 mg twice daily, carvedilol  12.5 mg twice daily, and hydrochlorothiazide  25 mg daily.  Reports compliance.  She does not regularly check her blood pressure but reports no issues. She limits salt intake but  occasionally adds some to her food. No chest pain or shortness of breath, but she notes swelling in her feet, especially in hot weather.  Blood pressure is a little low today.  She denies any dizziness.  HL: she continues atorvastatin  20 mg daily. She does not report any new symptoms related to this condition.  She experiences chronic back pain, with pain on both sides but mainly the right which radiates down the right leg which significantly impacts her mobility. She uses tramadol  as needed and gabapentin  600 mg twice daily, noting better sleep and pain relief when taking 900 mg at night. She has previously received injections for pain relief, which were effective temporarily lasting a good 4 to 6 months.  She would like to get back in with Dr. Eldonna to get an injection.  She tells me that she had seen vein and vascular specialist because she thought it was the vein in her legs causing the pain but they told her that it is coming from her back.    She continues to take omeprazole  twice daily for acid reflux, which is exacerbated by spicy foods which she loves.  She does not want to try to cut back on the omeprazole  to once a day.  HM: Due for colonoscopy again.  Last done 5 years ago by Dr. Belvie Campi    Patient Active Problem List   Diagnosis Date Noted   Stage 3a chronic kidney disease (HCC) 03/01/2022   Moderate major  depression (HCC) 06/07/2021   Pneumococcal vaccination declined 10/18/2019   Chronic cough 08/03/2018   Other intervertebral disc degeneration, lumbar region 03/18/2018   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/21/2016   Lumbar pain with radiation down left and right leg 07/02/2016   Thyroid  nodule 06/09/2015   Nodule of right lung 07/28/2014   Diverticulosis of colon without hemorrhage 07/28/2014   DJD (degenerative joint disease), lumbar 02/22/2014   Lumbar pain with radiation down left leg 01/17/2014   Breast  cancer of upper-inner quadrant of left female breast (HCC) 01/05/2014   Hyperlipidemia associated with type 2 diabetes mellitus (HCC) 09/28/2013   S/P total hysterectomy and bilateral salpingo-oophorectomy 09/20/2013   Essential hypertension    Diabetes mellitus (HCC) 09/16/2013     Current Outpatient Medications on File Prior to Visit  Medication Sig Dispense Refill   Accu-Chek Softclix Lancets lancets Use as instructed 100 each 12   albuterol  (VENTOLIN  HFA) 108 (90 Base) MCG/ACT inhaler Inhale 2 puffs into the lungs every 6 (six) hours as needed for wheezing or shortness of breath. 8 g 3   Ascorbic Acid (VITAMIN C) 1000 MG tablet Take 1,000 mg by mouth daily.     aspirin EC 325 MG tablet Take 325 mg by mouth daily.     Blood Glucose Monitoring Suppl (ACCU-CHEK GUIDE) w/Device KIT UAD to check BS once a day 1 kit 0   [facility] SENNA PLUS 8.6-50 MG tablet TAKE 2 TABLETS BY MOUTH AT BEDTIME AS NEEDED FOR MILD CONSTIPATION (Patient taking differently: Take 1 tablet by mouth at bedtime.) 60 tablet 11   glucose blood (ACCU-CHEK GUIDE TEST) test strip Use as instructed to check BS once a day 100 each 12   metFORMIN  (GLUCOPHAGE ) 500 MG tablet TAKE 1 & 1/2 TABLETS (750 MG) BY MOUTH 2 TIMES DAILY. 270 tablet 2   Multiple Vitamins-Minerals (MULTIVITAMIN WITH MINERALS) tablet Take 1 tablet by mouth daily. Reported on 03/13/2015     nystatin  (MYCOSTATIN ) 100000 UNIT/ML suspension Take 5 mLs (500,000 Units total) by mouth 4 (four) times daily. 60 mL 0   traMADol  (ULTRAM ) 50 MG tablet TAKE 1 TABLET EVERY 4 HOURS AS NEEDED (EACH PRESCRIPTION TO LAST 1 MONTH). 120 tablet 1   No current facility-administered medications on file prior to visit.    No Known Allergies  Social History   Socioeconomic History   Marital status: Married    Spouse name: Yuette Putnam    Number of children: 3   Years of education: 12   Highest education level: Not on file  Occupational History    Employer: UNEMPLOYED  Tobacco  Use   Smoking status: Never   Smokeless tobacco: Never  Vaping Use   Vaping status: Never Used  Substance and Sexual Activity   Alcohol use: No   Drug use: No   Sexual activity: Not Currently    Birth control/protection: Post-menopausal  Other Topics Concern   Not on file  Social History Narrative   Married to [employer] in 1986.    Has 3 children from previous marriage, 1 in [place], 1 in [place], 1 in prison ([place]).    Lives with husband.    Right-handed.   1 cup caffeine per day.   Social Drivers of Health   Financial Resource Strain: High Risk (04/29/2023)   Overall Financial Resource Strain (CARDIA)    Difficulty of Paying Living Expenses: Very hard  Food Insecurity: No Food Insecurity (04/29/2023)   Hunger  Vital Sign    Worried About Programme researcher, broadcasting/film/video in the Last Year: Never true    Ran Out of Food in the Last Year: Never true  Recent Concern: Food Insecurity - Food Insecurity Present (03/10/2023)   Hunger Vital Sign    Worried About Running Out of Food in the Last Year: Sometimes true    Ran Out of Food in the Last Year: Sometimes true  Transportation Needs: No Transportation Needs (04/29/2023)   PRAPARE - Administrator, Civil Service (Medical): No    Lack of Transportation (Non-Medical): No  Physical Activity: Sufficiently Active (04/29/2023)   Exercise Vital Sign    Days of Exercise per Week: 5 days    Minutes of Exercise per Session: 30 min  Stress: Stress Concern Present (04/29/2023)   [employer] of Occupational Health - Occupational Stress Questionnaire    Feeling of Stress : To some extent  Social Connections: Moderately Integrated (04/29/2023)   Social Connection and Isolation Panel    Frequency of Communication with Friends and Family: More than three times a week    Frequency of Social Gatherings with Friends and Family: Once a week    Attends Religious Services: More than 4 times per year    Active Member of [employer] or  Organizations: Yes    Attends Banker Meetings: More than 4 times per year    Marital Status: Widowed  Intimate Partner Violence: Not At Risk (04/29/2023)   Humiliation, Afraid, Rape, and Kick questionnaire    Fear of Current or Ex-Partner: No    Emotionally Abused: No    Physically Abused: No    Sexually Abused: No    Family History  Problem Relation Age of Onset   Diabetes Mother    Multiple myeloma Mother 73   Hearing loss Mother    Diabetes Brother    Cancer Brother 38       prostate cancer    Diabetes Maternal Aunt    Leukemia Maternal Aunt        dx in her 73s   Alcoholism Brother    Heart attack Father    Diabetes Sister    Diabetes Son     Past Surgical History:  Procedure Laterality Date   ABDOMINAL HYSTERECTOMY N/A 09/20/2013   Procedure: HYSTERECTOMY ABDOMINAL;  Surgeon: Gloris DELENA Hugger, MD;  Location: [facility];  Service: Gynecology;  Laterality: N/A;   BREAST LUMPECTOMY Left 2015   radiation   BREAST LUMPECTOMY WITH AXILLARY LYMPH NODE BIOPSY Left 12/29/13   left breast    CATARACT EXTRACTION Right    LAPAROSCOPIC ASSISTED VAGINAL HYSTERECTOMY  09/20/2013   Procedure: LAPAROSCOPIC ASSISTED VAGINAL HYSTERECTOMY;  Surgeon: Gloris DELENA Hugger, MD;  Location: [facility];  Service: Gynecology;;  PT WAS EXAMINED WHILE UP IN [place] AND IT WAS DECIDED TO OPEN PT DUE TO LARGE MASS   LUMBAR LAMINECTOMY/DECOMPRESSION MICRODISCECTOMY Right 12/14/2014   Procedure: Right Lumbar three-four microdiskectomy;  Surgeon: Reyes Budge, MD;  Location: [facility];  Service: Neurosurgery;  Laterality: Right;  Right Lumbar three-four microdiskectomy   RADIOACTIVE SEED GUIDED PARTIAL MASTECTOMY WITH AXILLARY SENTINEL LYMPH NODE BIOPSY Left 12/29/2013   Procedure: LEFT BREAST RADIOACTIVE SEED LOCALIZED LUMPECTOMY WITH SENTINEL LYMPH NODE MAPPING;  Surgeon: Debby Shipper, MD;  Location: [facility];  Service: General;  Laterality: Left;   RE-EXCISION OF BREAST  LUMPECTOMY Left 03/02/2014   Procedure: RE-EXCISION OF LEFT BREAST LUMPECTOMY;  Surgeon: Debby Shipper, MD;  Location: [facility];  Service: General;  Laterality: Left;   SALPINGOOPHORECTOMY  09/20/2013   Procedure: SALPINGO OOPHORECTOMY;  Surgeon: Gloris DELENA Hugger, MD;  Location: [facility];  Service: Gynecology;;    ROS: Review of Systems Negative except as stated above  PHYSICAL EXAM: BP 99/68   Pulse 66   Temp 98.3 F (36.8 C) (Oral)   Ht 5' 5 (1.651 m)   Wt 187 lb (84.8 kg)   SpO2 97%   BMI 31.12 kg/m   Wt Readings from Last 3 Encounters:  08/15/23 187 lb (84.8 kg)  05/05/23 189 lb 1 oz (85.8 kg)  04/29/23 176 lb (79.8 kg)    Physical Exam  General appearance - alert, well appearing, and in no distress Mental status - normal mood, behavior, speech, dress, motor activity, and thought processes Neck - supple, no significant adenopathy Mouth: Tongue is slightly dry. Chest - clear to auscultation, no wheezes, rales or rhonchi, symmetric air entry Heart - normal rate, regular rhythm, normal S1, S2, no murmurs, rubs, clicks or gallops Extremities -trace bilateral lower extremity edema with moderate varicosities and spider veins on the lower one third of the extremities and dorsal surface of the feet. MSK: Power in left lower extremity 5/5 proximally and distally.  Power in right lower extremity 4/5 proximally and distally.  Her fianc has her cane.      Latest Ref Rng & Units 05/05/2023   10:59 AM 05/02/2022   10:41 AM 03/01/2022   10:10 AM  CMP  Glucose 70 - 99 mg/dL 896  898  897   BUN 8 - 23 mg/dL 24  19  20    Creatinine 0.44 - 1.00 mg/dL 8.82  8.96  9.06   Sodium 135 - 145 mmol/L 142  142  143   Potassium 3.5 - 5.1 mmol/L 4.7  3.6  4.3   Chloride 98 - 111 mmol/L 104  101  99   CO2 22 - 32 mmol/L 31  31  24    Calcium  8.9 - 10.3 mg/dL 9.5  9.1  89.7   Total Protein 6.5 - 8.1 g/dL 6.9  7.2    Total Bilirubin 0.0 - 1.2 mg/dL 0.3  0.6    Alkaline Phos 38 -  126 U/L 44  48    AST 15 - 41 U/L 14  14    ALT 0 - 44 U/L 10  11     Lipid Panel     Component Value Date/Time   CHOL 135 07/03/2022 0922   TRIG 236 (H) 07/03/2022 0922   HDL 40 07/03/2022 0922   CHOLHDL 3.4 07/03/2022 0922   CHOLHDL 5.9 09/27/2013 1053   VLDL 48 (H) 09/27/2013 1053   LDLCALC 57 07/03/2022 0922    CBC    Component Value Date/Time   WBC 8.4 05/05/2023 1059   WBC 10.7 (H) 02/19/2022 2020   RBC 4.38 05/05/2023 1059   HGB 12.4 05/05/2023 1059   HGB 12.8 06/25/2021 1154   HGB 14.5 11/08/2016 0915   HCT 39.7 05/05/2023 1059   HCT 39.9 06/25/2021 1154   HCT 44.2 11/08/2016 0915   PLT 264 05/05/2023 1059   PLT 307 06/25/2021 1154   MCV 90.6 05/05/2023 1059   MCV 86 06/25/2021 1154   MCV 85.3 11/08/2016 0915   MCH 28.3 05/05/2023 1059   MCHC 31.2 05/05/2023 1059   RDW 14.3 05/05/2023 1059   RDW 14.3 06/25/2021 1154   RDW 15.1 (H) 11/08/2016 0915   LYMPHSABS 3.2 05/05/2023 1059  LYMPHSABS 1.7 11/08/2016 0915   MONOABS 0.6 05/05/2023 1059   MONOABS 0.7 11/08/2016 0915   EOSABS 0.3 05/05/2023 1059   EOSABS 0.1 11/08/2016 0915   BASOSABS 0.1 05/05/2023 1059   BASOSABS 0.0 11/08/2016 0915    ASSESSMENT AND PLAN: 1. Type 2 diabetes mellitus with obesity (HCC) (Primary) Control.  Continue metformin  500 mg twice a day.  Continue healthy eating habits.  Encouraged her to get rid of the [facility] and try to drink more water at least 4 to 8 glasses a day. - POCT glycosylated hemoglobin (Hb A1C) - POCT glucose (manual entry)  2. Diabetes mellitus treated with oral medication (HCC) See #1 above.  3. Hypertension associated with diabetes (HCC) At goal with blood pressure a little low the patient asymptomatic.  She will continue carvedilol , hydrochlorothiazide  and clonidine .  Advised that if she develops any dizziness with low blood pressure she should let me know so that we can decrease or stop one of her medications. - carvedilol  (COREG ) 12.5 MG tablet; Take  1 tablet (12.5 mg total) by mouth 2 (two) times daily with a meal.  Dispense: 180 tablet; Refill: 2 - hydrochlorothiazide  (HYDRODIURIL ) 25 MG tablet; Take 1 tablet (25 mg total) by mouth daily.  Dispense: 90 tablet; Refill: 1  4. Hyperlipidemia associated with type 2 diabetes mellitus (HCC) - atorvastatin  (LIPITOR) 20 MG tablet; TAKE 1 TABLET BY MOUTH DAILY. STOP PRAVASTATIN   Dispense: 90 tablet; Refill: 1  5. Gastroesophageal reflux disease without esophagitis Advised patient to avoid spicy foods.  We have discussed trying to cut back on the omeprazole  to once a day but patient feels this would not be effective for her so we continue omeprazole  20 mg twice a day. - omeprazole  (PRILOSEC) 20 MG capsule; Take 1 capsule (20 mg total) by mouth 2 (two) times daily before a meal.  Dispense: 180 capsule; Refill: 1  6. Radiculopathy, lumbar region Chronic back pain with right-sided radiculopathy causing significant pain and mobility issues. Previous steroid injection provided temporary relief. Current medication includes tramadol  and gabapentin , with a request to increase gabapentin  for improved pain management and sleep. - Refer to Dr. Eldonna for evaluation and possible steroid injection. - Increase gabapentin  to 600 mg in the morning and 900 mg at night. - Continue tramadol  as needed.  Patient's fianc inquired about her having something stronger for pain.  Advised that we do not prescribe Vicodin or Percocet for chronic pain through our clinic.  We would need to refer to a pain specialist.  Patient declined stating that she will do okay with a repeat injection and the increased evening dose of gabapentin .  Patient is up-to-date with controlled substance prescribing agreement for the tramadol . - Avoid heavy lifting and excessive pushing/pulling. - AMB referral to orthopedics - gabapentin  (NEURONTIN ) 300 MG capsule; 2 tabs PO Q a.m and 3 tabs PO Q p.m  Dispense: 450 capsule; Refill: 1  7. Chronic  radicular pain of lower back See #6 above - AMB referral to orthopedics  8. Screening for colon cancer - Ambulatory referral to Gastroenterology   Patient was given the opportunity to ask questions.  Patient verbalized understanding of the plan and was able to repeat key elements of the plan.   This documentation was completed using Paediatric nurse.  Any transcriptional errors are unintentional.  Orders Placed This Encounter  Procedures   AMB referral to orthopedics   Ambulatory referral to Gastroenterology   POCT glycosylated hemoglobin (Hb A1C)   POCT glucose (  manual entry)     Requested Prescriptions   Signed Prescriptions Disp Refills   atorvastatin  (LIPITOR) 20 MG tablet 90 tablet 1    Sig: TAKE 1 TABLET BY MOUTH DAILY. STOP PRAVASTATIN    carvedilol  (COREG ) 12.5 MG tablet 180 tablet 2    Sig: Take 1 tablet (12.5 mg total) by mouth 2 (two) times daily with a meal.   cloNIDine  (CATAPRES ) 0.1 MG tablet 180 tablet 1    Sig: Take 1 tablet (0.1 mg total) by mouth 2 (two) times daily.   hydrochlorothiazide  (HYDRODIURIL ) 25 MG tablet 90 tablet 1    Sig: Take 1 tablet (25 mg total) by mouth daily.   omeprazole  (PRILOSEC) 20 MG capsule 180 capsule 1    Sig: Take 1 capsule (20 mg total) by mouth 2 (two) times daily before a meal.   potassium chloride  (KLOR-CON ) 10 MEQ tablet 90 tablet 1    Sig: Take 1 tablet (10 mEq total) by mouth daily.   gabapentin  (NEURONTIN ) 300 MG capsule 450 capsule 1    Sig: 2 tabs PO Q a.m and 3 tabs PO Q p.m    Return in about 4 months (around 12/16/2023).  Barnie Louder, MD, FACP

## 2023-08-15 NOTE — Patient Instructions (Signed)
 VISIT SUMMARY:  Today, we reviewed your management plan for diabetes, hypertension, hyperlipidemia, chronic back pain, and acid reflux. Your diabetes is well-controlled, and we discussed some lifestyle changes to help with your overall health. We also addressed your chronic back pain and made adjustments to your medication to improve your comfort and mobility.  YOUR PLAN:  -CHRONIC BACK PAIN WITH RADICULOPATHY: Chronic back pain with radiculopathy means you have ongoing back pain that radiates down your leg, causing significant discomfort and mobility issues. We will refer you to Dr. Eldonna for evaluation and possible steroid injection. Your gabapentin  dose will be increased to 600 mg in the morning and 900 mg at night to help with pain and sleep. Continue taking tramadol  as needed and avoid heavy lifting and excessive pushing or pulling.  -TYPE 2 DIABETES MELLITUS: Type 2 Diabetes Mellitus is a condition where your body does not use insulin  properly, leading to high blood sugar levels. Your diabetes is well-controlled with an A1c of 5.8%. Continue taking metformin  500 mg twice daily. Try to reduce your intake of sugary drinks and increase your water intake to 4-8 glasses per day.  -HYPERTENSION: Hypertension is high blood pressure. Your blood pressure is currently well-managed with your medications. Continue taking clonidine  0.1 mg twice daily, carvedilol  12.5 mg twice daily, and hydrochlorothiazide  25 mg daily. Monitor for any dizziness and let us  know if it occurs, as we may need to adjust your medication.  -HYPERLIPIDEMIA: Hyperlipidemia is having high levels of fats (lipids) in your blood, which can increase your risk of heart disease. Continue taking atorvastatin  20 mg daily to manage this condition.  -GASTROESOPHAGEAL REFLUX DISEASE (GERD): GERD is a condition where stomach acid frequently flows back into the tube connecting your mouth and stomach, causing discomfort. Continue taking omeprazole   twice daily and try to avoid spicy foods, which can worsen your symptoms.  INSTRUCTIONS:  You are due for a colonoscopy. We will submit a referral for you to see Dr. Belvie Just for this screening. Please follow up with us  if you experience any new symptoms or have any concerns.

## 2023-08-24 ENCOUNTER — Other Ambulatory Visit: Payer: Self-pay

## 2023-08-24 ENCOUNTER — Inpatient Hospital Stay (HOSPITAL_COMMUNITY)
Admission: EM | Admit: 2023-08-24 | Discharge: 2023-08-29 | DRG: 312 | Disposition: A | Discharge status: Skilled Nursing Facility | Attending: Internal Medicine | Admitting: Internal Medicine

## 2023-08-24 ENCOUNTER — Emergency Department (HOSPITAL_COMMUNITY)

## 2023-08-24 DIAGNOSIS — E1140 Type 2 diabetes mellitus with diabetic neuropathy, unspecified: Secondary | ICD-10-CM | POA: Diagnosis not present

## 2023-08-24 DIAGNOSIS — Z853 Personal history of malignant neoplasm of breast: Secondary | ICD-10-CM

## 2023-08-24 DIAGNOSIS — Z923 Personal history of irradiation: Secondary | ICD-10-CM | POA: Diagnosis not present

## 2023-08-24 DIAGNOSIS — R569 Unspecified convulsions: Secondary | ICD-10-CM | POA: Diagnosis not present

## 2023-08-24 DIAGNOSIS — S32591A Other specified fracture of right pubis, initial encounter for closed fracture: Secondary | ICD-10-CM | POA: Diagnosis not present

## 2023-08-24 DIAGNOSIS — R519 Headache, unspecified: Secondary | ICD-10-CM | POA: Diagnosis not present

## 2023-08-24 DIAGNOSIS — R55 Syncope and collapse: Principal | ICD-10-CM | POA: Diagnosis present

## 2023-08-24 DIAGNOSIS — I129 Hypertensive chronic kidney disease with stage 1 through stage 4 chronic kidney disease, or unspecified chronic kidney disease: Secondary | ICD-10-CM | POA: Diagnosis not present

## 2023-08-24 DIAGNOSIS — Z79899 Other long term (current) drug therapy: Secondary | ICD-10-CM

## 2023-08-24 DIAGNOSIS — M1611 Unilateral primary osteoarthritis, right hip: Secondary | ICD-10-CM | POA: Diagnosis not present

## 2023-08-24 DIAGNOSIS — E1169 Type 2 diabetes mellitus with other specified complication: Secondary | ICD-10-CM | POA: Diagnosis present

## 2023-08-24 DIAGNOSIS — E876 Hypokalemia: Secondary | ICD-10-CM | POA: Diagnosis not present

## 2023-08-24 DIAGNOSIS — Z6832 Body mass index (BMI) 32.0-32.9, adult: Secondary | ICD-10-CM

## 2023-08-24 DIAGNOSIS — K8020 Calculus of gallbladder without cholecystitis without obstruction: Secondary | ICD-10-CM | POA: Diagnosis not present

## 2023-08-24 DIAGNOSIS — K219 Gastro-esophageal reflux disease without esophagitis: Secondary | ICD-10-CM | POA: Diagnosis not present

## 2023-08-24 DIAGNOSIS — E785 Hyperlipidemia, unspecified: Secondary | ICD-10-CM | POA: Diagnosis present

## 2023-08-24 DIAGNOSIS — S32599A Other specified fracture of unspecified pubis, initial encounter for closed fracture: Secondary | ICD-10-CM

## 2023-08-24 DIAGNOSIS — N1831 Chronic kidney disease, stage 3a: Secondary | ICD-10-CM | POA: Diagnosis not present

## 2023-08-24 DIAGNOSIS — Z8249 Family history of ischemic heart disease and other diseases of the circulatory system: Secondary | ICD-10-CM

## 2023-08-24 DIAGNOSIS — I493 Ventricular premature depolarization: Secondary | ICD-10-CM | POA: Diagnosis not present

## 2023-08-24 DIAGNOSIS — W19XXXA Unspecified fall, initial encounter: Secondary | ICD-10-CM | POA: Diagnosis present

## 2023-08-24 DIAGNOSIS — M5416 Radiculopathy, lumbar region: Secondary | ICD-10-CM

## 2023-08-24 DIAGNOSIS — Z7982 Long term (current) use of aspirin: Secondary | ICD-10-CM

## 2023-08-24 DIAGNOSIS — N281 Cyst of kidney, acquired: Secondary | ICD-10-CM | POA: Diagnosis not present

## 2023-08-24 DIAGNOSIS — Z7984 Long term (current) use of oral hypoglycemic drugs: Secondary | ICD-10-CM | POA: Diagnosis not present

## 2023-08-24 DIAGNOSIS — I639 Cerebral infarction, unspecified: Secondary | ICD-10-CM | POA: Diagnosis not present

## 2023-08-24 DIAGNOSIS — N3289 Other specified disorders of bladder: Secondary | ICD-10-CM | POA: Diagnosis not present

## 2023-08-24 DIAGNOSIS — E1122 Type 2 diabetes mellitus with diabetic chronic kidney disease: Secondary | ICD-10-CM | POA: Diagnosis not present

## 2023-08-24 DIAGNOSIS — Z833 Family history of diabetes mellitus: Secondary | ICD-10-CM | POA: Diagnosis not present

## 2023-08-24 DIAGNOSIS — R918 Other nonspecific abnormal finding of lung field: Secondary | ICD-10-CM | POA: Diagnosis not present

## 2023-08-24 DIAGNOSIS — G8929 Other chronic pain: Secondary | ICD-10-CM | POA: Diagnosis not present

## 2023-08-24 DIAGNOSIS — M542 Cervicalgia: Secondary | ICD-10-CM | POA: Diagnosis not present

## 2023-08-24 DIAGNOSIS — S329XXA Fracture of unspecified parts of lumbosacral spine and pelvis, initial encounter for closed fracture: Secondary | ICD-10-CM

## 2023-08-24 DIAGNOSIS — Z043 Encounter for examination and observation following other accident: Secondary | ICD-10-CM | POA: Diagnosis not present

## 2023-08-24 DIAGNOSIS — E66811 Obesity, class 1: Secondary | ICD-10-CM | POA: Diagnosis present

## 2023-08-24 DIAGNOSIS — I10 Essential (primary) hypertension: Secondary | ICD-10-CM | POA: Diagnosis present

## 2023-08-24 LAB — COMPREHENSIVE METABOLIC PANEL WITH GFR
ALT: 13 U/L (ref 0–44)
AST: 26 U/L (ref 15–41)
Albumin: 3.4 g/dL — ABNORMAL LOW (ref 3.5–5.0)
Alkaline Phosphatase: 45 U/L (ref 38–126)
Anion gap: 13 (ref 5–15)
BUN: 20 mg/dL (ref 8–23)
CO2: 23 mmol/L (ref 22–32)
Calcium: 9.1 mg/dL (ref 8.9–10.3)
Chloride: 104 mmol/L (ref 98–111)
Creatinine, Ser: 1.05 mg/dL — ABNORMAL HIGH (ref 0.44–1.00)
GFR, Estimated: 56 mL/min — ABNORMAL LOW (ref >60)
Glucose, Bld: 143 mg/dL — ABNORMAL HIGH (ref 70–99)
Potassium: 3.5 mmol/L (ref 3.5–5.1)
Sodium: 140 mmol/L (ref 135–145)
Total Bilirubin: 0.9 mg/dL (ref 0.0–1.2)
Total Protein: 7.1 g/dL (ref 6.5–8.1)

## 2023-08-24 LAB — CBC WITH DIFFERENTIAL/PLATELET
Abs Immature Granulocytes: 0.03 K/uL (ref 0.00–0.07)
Basophils Absolute: 0.1 K/uL (ref 0.0–0.1)
Basophils Relative: 0 %
Eosinophils Absolute: 0.4 K/uL (ref 0.0–0.5)
Eosinophils Relative: 4 %
HCT: 39.7 % (ref 36.0–46.0)
Hemoglobin: 12.6 g/dL (ref 12.0–15.0)
Immature Granulocytes: 0 %
Lymphocytes Relative: 17 %
Lymphs Abs: 1.9 K/uL (ref 0.7–4.0)
MCH: 29 pg (ref 26.0–34.0)
MCHC: 31.7 g/dL (ref 30.0–36.0)
MCV: 91.5 fL (ref 80.0–100.0)
Monocytes Absolute: 0.9 K/uL (ref 0.1–1.0)
Monocytes Relative: 8 %
Neutro Abs: 8 K/uL — ABNORMAL HIGH (ref 1.7–7.7)
Neutrophils Relative %: 71 %
Platelets: 265 K/uL (ref 150–400)
RBC: 4.34 MIL/uL (ref 3.87–5.11)
RDW: 14 % (ref 11.5–15.5)
WBC: 11.3 K/uL — ABNORMAL HIGH (ref 4.0–10.5)
nRBC: 0 % (ref 0.0–0.2)

## 2023-08-24 MED ORDER — IOHEXOL 350 MG/ML SOLN
75.0000 mL | Freq: Once | INTRAVENOUS | Status: AC | PRN
  Administered 2023-08-24: 75 mL via INTRAVENOUS

## 2023-08-24 MED ORDER — ONDANSETRON HCL 4 MG/2ML IJ SOLN
4.0000 mg | Freq: Once | INTRAMUSCULAR | Status: AC
  Administered 2023-08-24: 4 mg via INTRAVENOUS
  Filled 2023-08-24: qty 2

## 2023-08-24 MED ORDER — MORPHINE SULFATE (PF) 4 MG/ML IV SOLN
4.0000 mg | Freq: Once | INTRAVENOUS | Status: AC
  Administered 2023-08-24: 4 mg via INTRAVENOUS
  Filled 2023-08-24: qty 1

## 2023-08-24 NOTE — ED Triage Notes (Signed)
 Pt came in via POV d/t falling while carrying a box yesterday & falling backwards, after coming around she yelled for help & her partner called EMS & they helped her up. A/Ox4, during triage she c/o lower back, Rt knee & pain in vaginal area, rates her pain 10/10.

## 2023-08-24 NOTE — ED Provider Notes (Incomplete)
 [place] [facility] AT [facility] Provider Note   CSN: [id number] Arrival date & time: 08/24/23  1849     Patient presents with: Fall   Rachel Herman is a 73 y.o. female.  {Add pertinent medical, surgical, social history, OB history to HPI:429} 73 year old female presents from home after syncope and fall.  Patient states that she was carrying in boxes of food from their car when she fell.  She believes she may have passed out, states that she was carrying the boxes and the next thing she members is laying on the ground unable to stand up with pain in her right hip and lower back/rectum.       Prior to Admission medications   Medication Sig Start Date End Date Taking? Authorizing Provider  Accu-Chek Softclix Lancets lancets Use as instructed 03/10/23   Vicci Barnie NOVAK, MD  albuterol  (VENTOLIN  HFA) 108 [phone] Base) MCG/ACT inhaler Inhale 2 puffs into the lungs every 6 (six) hours as needed for wheezing or shortness of breath. 07/03/22   Danton Jon HERO, PA-C  Ascorbic Acid (VITAMIN C) 1000 MG tablet Take 1,000 mg by mouth daily.    [provider]  aspirin EC 325 MG tablet Take 325 mg by mouth daily.    [provider]  atorvastatin  (LIPITOR) 20 MG tablet TAKE 1 TABLET BY MOUTH DAILY. STOP PRAVASTATIN  08/15/23   Vicci Barnie NOVAK, MD  Blood Glucose Monitoring Suppl (ACCU-CHEK GUIDE) w/Device KIT UAD to check BS once a day 03/10/23   Vicci Barnie NOVAK, MD  carvedilol  (COREG ) 12.5 MG tablet Take 1 tablet (12.5 mg total) by mouth 2 (two) times daily with a meal. 08/15/23   Vicci Barnie NOVAK, MD  cloNIDine  (CATAPRES ) 0.1 MG tablet Take 1 tablet (0.1 mg total) by mouth 2 (two) times daily. 08/15/23   Vicci Barnie NOVAK, MD  [employer] SENNA PLUS 8.6-50 MG tablet TAKE 2 TABLETS BY MOUTH AT BEDTIME AS NEEDED FOR MILD CONSTIPATION Patient taking differently: Take 1 tablet by mouth at bedtime. 07/11/16   Funches, Josalyn, MD  gabapentin  (NEURONTIN ) 300 MG  capsule 2 tabs PO Q a.m and 3 tabs PO Q p.m 08/15/23   Vicci Barnie NOVAK, MD  glucose blood (ACCU-CHEK GUIDE TEST) test strip Use as instructed to check BS once a day 03/10/23   Vicci Barnie NOVAK, MD  hydrochlorothiazide  (HYDRODIURIL ) 25 MG tablet Take 1 tablet (25 mg total) by mouth daily. 08/15/23   Vicci Barnie NOVAK, MD  metFORMIN  (GLUCOPHAGE ) 500 MG tablet TAKE 1 & 1/2 TABLETS (750 MG) BY MOUTH 2 TIMES DAILY. 05/12/23   Vicci Barnie NOVAK, MD  Multiple Vitamins-Minerals (MULTIVITAMIN WITH MINERALS) tablet Take 1 tablet by mouth daily. Reported on 03/13/2015    [provider]  nystatin  (MYCOSTATIN ) 100000 UNIT/ML suspension Take 5 mLs (500,000 Units total) by mouth 4 (four) times daily. 05/05/23   Thayil, Irene T, PA-C  omeprazole  (PRILOSEC) 20 MG capsule Take 1 capsule (20 mg total) by mouth 2 (two) times daily before a meal. 08/15/23   Vicci Barnie NOVAK, MD  potassium chloride  (KLOR-CON ) 10 MEQ tablet Take 1 tablet (10 mEq total) by mouth daily. 08/15/23   Vicci Barnie NOVAK, MD  traMADol  (ULTRAM ) 50 MG tablet TAKE 1 TABLET EVERY 4 HOURS AS NEEDED (EACH PRESCRIPTION TO LAST 1 MONTH). 08/06/23   Vicci Barnie NOVAK, MD    Allergies: Patient has no known allergies.    Review of Systems Negative except as per HPI Updated Vital Signs BP ROLLEN)  146/78 (BP Location: Right Arm)   Pulse 93   Temp 98.4 F (36.9 C) (Oral)   Resp (!) 21   SpO2 98%   Physical Exam Vitals and nursing note reviewed.  Constitutional:      General: She is not in acute distress.    Appearance: She is well-developed. She is not diaphoretic.  HENT:     Head: Normocephalic and atraumatic.     Mouth/Throat:     Mouth: Mucous membranes are moist.  Eyes:     Extraocular Movements: Extraocular movements intact.     Pupils: Pupils are equal, round, and reactive to light.  Cardiovascular:     Rate and Rhythm: Normal rate and regular rhythm.     Pulses: Normal pulses.  Pulmonary:     Effort: Pulmonary effort is normal.      Breath sounds: Normal breath sounds.  Abdominal:     Palpations: Abdomen is soft.     Tenderness: There is abdominal tenderness.  Musculoskeletal:     Right lower leg: No edema.     Left lower leg: No edema.     Comments: Unable to logroll right leg secondary to pain.  Right leg is externally rotated and slightly shortened.  Pain in left hip with hip flexion.  No pain in upper extremities.  Skin:    General: Skin is warm and dry.  Neurological:     Mental Status: She is alert and oriented to person, place, and time.  Psychiatric:        Behavior: Behavior normal.     (all labs ordered are listed, but only abnormal results are displayed) Labs Reviewed  CBC WITH DIFFERENTIAL/PLATELET - Abnormal; Notable for the following components:      Result Value   WBC 11.3 (*)    Neutro Abs 8.0 (*)    All other components within normal limits  COMPREHENSIVE METABOLIC PANEL WITH GFR - Abnormal; Notable for the following components:   Glucose, Bld 143 (*)    Creatinine, Ser 1.05 (*)    Albumin 3.4 (*)    GFR, Estimated 56 (*)    All other components within normal limits    EKG: None  Radiology: DG Hip Unilat W or Wo Pelvis 2-3 Views Right Result Date: 08/24/2023 CLINICAL DATA:  Status post fall. EXAM: DG HIP (WITH OR WITHOUT PELVIS) 2-3V RIGHT COMPARISON:  None Available. FINDINGS: There is no evidence of an acute hip fracture or dislocation. Marked severity degenerative changes are seen in the of joint space narrowing, acetabular sclerosis, lateral acetabular bony spurring and subchondral cyst formation. IMPRESSION: Marked severity degenerative changes of the right hip. Electronically Signed   By: Suzen Dials M.D.   On: 08/24/2023 19:48    {Document cardiac monitor, telemetry assessment procedure when appropriate:32947} Procedures   Medications Ordered in the ED  ondansetron  (ZOFRAN ) injection 4 mg (has no administration in time range)  morphine  (PF) 4 MG/ML injection 4 mg  (has no administration in time range)      {Click here for ABCD2, HEART and other calculators REFRESH Note before signing:1}                              Medical Decision Making Risk Prescription drug management.   This patient presents to the ED for concern of ***, this involves an extensive number of treatment options, and is a complaint that carries with it a high risk of complications and morbidity.  The differential diagnosis includes ***   Co morbidities / Chronic conditions that complicate the patient evaluation  ***   Additional history obtained:  Additional history obtained from EMR External records from outside source obtained and reviewed including ***   Lab Tests:  I Ordered, and personally interpreted labs.  The pertinent results include:  ***   Imaging Studies ordered:  I ordered imaging studies including ***  I independently visualized and interpreted imaging which showed *** I agree with the radiologist interpretation   Cardiac Monitoring: / EKG:  The patient was maintained on a cardiac monitor.  I personally viewed and interpreted the cardiac monitored which showed an underlying rhythm of: ***   Problem List / ED Course / Critical interventions / Medication management  *** I ordered medication including ***   Reevaluation of the patient after these medicines showed that the patient *** I have reviewed the patients home medicines and have made adjustments as needed   Consultations Obtained:  I requested consultation with the ***,  and discussed lab and imaging findings as well as pertinent plan - they recommend: ***   Social Determinants of Health:  ***   Test / Admission - Considered:  ***   {Document critical care time when appropriate  Document review of labs and clinical decision tools ie CHADS2VASC2, etc  Document your independent review of radiology images and any outside records  Document your discussion with family members,  caretakers and with consultants  Document social determinants of health affecting pt's care  Document your decision making why or why not admission, treatments were needed:32947:::1}   Final diagnoses:  None    ED Discharge Orders     None

## 2023-08-24 NOTE — ED Provider Triage Note (Signed)
 Emergency Medicine Provider Triage Evaluation Note  Rachel Herman , a 73 y.o. female  was evaluated in triage.  Pt complains of mechanical fall.  Complains of right hip pain, abdominal pain into her pelvis.  Believes she hit her head and LOC.  Review of Systems  Positive:  Negative:   Physical Exam  BP (!) 140/89   Pulse 93   Temp 98.5 F (36.9 C)   Resp 16   SpO2 96%  Gen:   Awake, no distress   Resp:  Normal effort  MSK:   Moves extremities without difficulty  Other:  Abdomen with generalized tenderness  Medical Decision Making  Medically screening exam initiated at 7:13 PM.  Appropriate orders placed.  Rachel Herman was informed that the remainder of the evaluation will be completed by another provider, this initial triage assessment does not replace that evaluation, and the importance of remaining in the ED until their evaluation is complete.  Will obtain imaging   Rachel Herman 08/24/23 1921

## 2023-08-24 NOTE — ED Notes (Signed)
 Patient transported to CT

## 2023-08-25 ENCOUNTER — Observation Stay (HOSPITAL_COMMUNITY)

## 2023-08-25 ENCOUNTER — Ambulatory Visit (HOSPITAL_COMMUNITY)

## 2023-08-25 ENCOUNTER — Observation Stay (HOSPITAL_BASED_OUTPATIENT_CLINIC_OR_DEPARTMENT_OTHER)

## 2023-08-25 DIAGNOSIS — S32591A Other specified fracture of right pubis, initial encounter for closed fracture: Secondary | ICD-10-CM

## 2023-08-25 DIAGNOSIS — I639 Cerebral infarction, unspecified: Secondary | ICD-10-CM | POA: Diagnosis not present

## 2023-08-25 DIAGNOSIS — S32599A Other specified fracture of unspecified pubis, initial encounter for closed fracture: Secondary | ICD-10-CM

## 2023-08-25 DIAGNOSIS — R569 Unspecified convulsions: Secondary | ICD-10-CM | POA: Diagnosis not present

## 2023-08-25 DIAGNOSIS — R55 Syncope and collapse: Secondary | ICD-10-CM

## 2023-08-25 DIAGNOSIS — S329XXA Fracture of unspecified parts of lumbosacral spine and pelvis, initial encounter for closed fracture: Secondary | ICD-10-CM

## 2023-08-25 LAB — ECHOCARDIOGRAM COMPLETE
Area-P 1/2: 3.53 cm2
S' Lateral: 3.1 cm

## 2023-08-25 LAB — CBG MONITORING, ED
Glucose-Capillary: 91 mg/dL (ref 70–99)
Glucose-Capillary: 112 mg/dL — ABNORMAL HIGH (ref 70–99)
Glucose-Capillary: 143 mg/dL — ABNORMAL HIGH (ref 70–99)
Glucose-Capillary: 135 mg/dL — ABNORMAL HIGH (ref 70–99)

## 2023-08-25 LAB — CBC
HCT: 37.2 % (ref 36.0–46.0)
Hemoglobin: 11.9 g/dL — ABNORMAL LOW (ref 12.0–15.0)
MCH: 29.2 pg (ref 26.0–34.0)
MCHC: 32 g/dL (ref 30.0–36.0)
MCV: 91.2 fL (ref 80.0–100.0)
Platelets: 244 K/uL (ref 150–400)
RBC: 4.08 MIL/uL (ref 3.87–5.11)
RDW: 14.1 % (ref 11.5–15.5)
WBC: 10 K/uL (ref 4.0–10.5)
nRBC: 0 % (ref 0.0–0.2)

## 2023-08-25 LAB — TROPONIN I (HIGH SENSITIVITY): Troponin I (High Sensitivity): 6 ng/L (ref <18)

## 2023-08-25 MED ORDER — INSULIN ASPART 100 UNIT/ML IJ SOLN
0.0000 [IU] | Freq: Three times a day (TID) | INTRAMUSCULAR | Status: DC
  Administered 2023-08-26: 1 [IU] via SUBCUTANEOUS
  Administered 2023-08-26: 2 [IU] via SUBCUTANEOUS
  Administered 2023-08-27 – 2023-08-28 (×2): 1 [IU] via SUBCUTANEOUS
  Administered 2023-08-28 – 2023-08-29 (×2): 2 [IU] via SUBCUTANEOUS
  Administered 2023-08-29: 1 [IU] via SUBCUTANEOUS

## 2023-08-25 MED ORDER — ENOXAPARIN SODIUM 40 MG/0.4ML IJ SOSY
40.0000 mg | PREFILLED_SYRINGE | INTRAMUSCULAR | Status: DC
  Administered 2023-08-25 – 2023-08-29 (×5): 40 mg via SUBCUTANEOUS
  Filled 2023-08-25 (×5): qty 0.4

## 2023-08-25 MED ORDER — NALOXONE HCL 0.4 MG/ML IJ SOLN: 0.4000 mg | INTRAMUSCULAR | Status: DC | PRN

## 2023-08-25 MED ORDER — ATORVASTATIN CALCIUM 10 MG PO TABS
20.0000 mg | ORAL_TABLET | Freq: Every day | ORAL | Status: DC
  Administered 2023-08-25 – 2023-08-29 (×5): 20 mg via ORAL
  Filled 2023-08-25 (×5): qty 2

## 2023-08-25 MED ORDER — GABAPENTIN 300 MG PO CAPS
900.0000 mg | ORAL_CAPSULE | Freq: Every day | ORAL | Status: DC
  Administered 2023-08-25 – 2023-08-28 (×4): 900 mg via ORAL
  Filled 2023-08-25 (×4): qty 3

## 2023-08-25 MED ORDER — ACETAMINOPHEN 325 MG PO TABS
650.0000 mg | ORAL_TABLET | Freq: Four times a day (QID) | ORAL | Status: DC | PRN
  Administered 2023-08-25 – 2023-08-29 (×5): 650 mg via ORAL
  Filled 2023-08-25 (×5): qty 2

## 2023-08-25 MED ORDER — GABAPENTIN 300 MG PO CAPS
600.0000 mg | ORAL_CAPSULE | Freq: Every day | ORAL | Status: DC
  Administered 2023-08-25 – 2023-08-29 (×5): 600 mg via ORAL
  Filled 2023-08-25 (×5): qty 2

## 2023-08-25 MED ORDER — PANTOPRAZOLE SODIUM 40 MG PO TBEC
40.0000 mg | DELAYED_RELEASE_TABLET | Freq: Every day | ORAL | Status: DC
  Administered 2023-08-25 – 2023-08-29 (×5): 40 mg via ORAL
  Filled 2023-08-25 (×5): qty 1

## 2023-08-25 MED ORDER — GABAPENTIN 300 MG PO CAPS: 600.0000 mg | ORAL_CAPSULE | ORAL | Status: DC

## 2023-08-25 MED ORDER — ONDANSETRON HCL 4 MG/2ML IJ SOLN
4.0000 mg | Freq: Once | INTRAMUSCULAR | Status: AC
  Administered 2023-08-25: 4 mg via INTRAVENOUS
  Filled 2023-08-25: qty 2

## 2023-08-25 MED ORDER — INSULIN ASPART 100 UNIT/ML IJ SOLN
0.0000 [IU] | Freq: Every day | INTRAMUSCULAR | Status: DC
  Administered 2023-08-28: 2 [IU] via SUBCUTANEOUS

## 2023-08-25 MED ORDER — ACETAMINOPHEN 650 MG RE SUPP: 650.0000 mg | Freq: Four times a day (QID) | RECTAL | Status: DC | PRN

## 2023-08-25 MED ORDER — MORPHINE SULFATE (PF) 2 MG/ML IV SOLN
1.0000 mg | INTRAVENOUS | Status: DC | PRN
  Administered 2023-08-25 – 2023-08-29 (×8): 1 mg via INTRAVENOUS
  Filled 2023-08-25 (×8): qty 1

## 2023-08-25 MED ORDER — OXYCODONE HCL 5 MG PO TABS
5.0000 mg | ORAL_TABLET | Freq: Four times a day (QID) | ORAL | Status: DC | PRN
  Administered 2023-08-25 – 2023-08-29 (×9): 5 mg via ORAL
  Filled 2023-08-25 (×9): qty 1

## 2023-08-25 MED ORDER — MORPHINE SULFATE (PF) 4 MG/ML IV SOLN
4.0000 mg | Freq: Once | INTRAVENOUS | Status: AC
  Administered 2023-08-25: 4 mg via INTRAVENOUS
  Filled 2023-08-25: qty 1

## 2023-08-25 MED ORDER — CARVEDILOL 12.5 MG PO TABS
12.5000 mg | ORAL_TABLET | Freq: Two times a day (BID) | ORAL | Status: DC
  Administered 2023-08-25 – 2023-08-29 (×8): 12.5 mg via ORAL
  Filled 2023-08-25 (×9): qty 1

## 2023-08-25 NOTE — Evaluation (Signed)
 Occupational Therapy Evaluation Patient Details Name: Rachel Herman MRN: [id number] DOB: 1950-12-18 Today's Date: 08/25/2023   History of Present Illness   Pt is a 73 y/o F admitted on 08/24/23 after presenting with c/o syncope & fall resulting in R sided hip/pelvic pain. CT showed nondisplaced right superior & inferior pubic ramus fractures. Ortho was consulted & recommending non operative management & WBAT. PMH: breast CA in remission, DM2, CKD 3A, GERD, HTN, HLD, obesity, chronic back pain/lumbar radiculopathy     Clinical Impressions PTA, pt lived with significant other and son and reports being independent in ADL, IADL, drives. Upon eval, pt with decreased strength, balance, safety, and activity tolerance relative to her pain. Pt needing CGA-min A for transfers from elevated surfaces and needing up to max A for LB ADL due to decr ROM at hips at Eval. Pt noted with foul smelling urine that she reports is not normal and urinary urgency/frequency. Spoke with RN about potential UA. Will continue to follow acutely and recommending inpatient rehab <3 hours at dc.      If plan is discharge home, recommend the following:   A little help with walking and/or transfers;A little help with bathing/dressing/bathroom;Assistance with cooking/housework;A lot of help with bathing/dressing/bathroom;Assist for transportation;Help with stairs or ramp for entrance     Functional Status Assessment   Patient has had a recent decline in their functional status and demonstrates the ability to make significant improvements in function in a reasonable and predictable amount of time.     Equipment Recommendations   None recommended by OT     Recommendations for Other Services         Precautions/Restrictions   Precautions Precautions: Fall Restrictions Weight Bearing Restrictions Per Provider Order: Yes RLE Weight Bearing Per Provider Order: Weight bearing as tolerated LLE Weight Bearing  Per Provider Order: Weight bearing as tolerated     Mobility Bed Mobility Overal bed mobility: Needs Assistance Bed Mobility: Supine to Sit, Sit to Supine     Supine to sit: Min assist Sit to supine: Min assist   General bed mobility comments: assist for RLE management    Transfers Overall transfer level: Needs assistance Equipment used: Rolling walker (2 wheels) Transfers: Sit to/from Stand Sit to Stand: Contact guard assist           General transfer comment: sit>stand from elevated ED stretcher, cuing re: hand placement      Balance Overall balance assessment: Needs assistance Sitting-balance support: Feet supported Sitting balance-Leahy Scale: Fair     Standing balance support: During functional activity, Bilateral upper extremity supported, Reliant on assistive device for balance Standing balance-Leahy Scale: Fair                             ADL either performed or assessed with clinical judgement   ADL Overall ADL's : Needs assistance/impaired Eating/Feeding: Sitting;Set up   Grooming: Set up;Sitting   Upper Body Bathing: Set up;Sitting   Lower Body Bathing: Maximal assistance;Sit to/from stand   Upper Body Dressing : Set up;Sitting   Lower Body Dressing: Maximal assistance;Sit to/from stand   Toilet Transfer: Minimal assistance;Stand-pivot;Rolling walker (2 wheels);BSC/3in1           Functional mobility during ADLs: Minimal assistance;Rolling walker (2 wheels)       Vision Patient Visual Report: No change from baseline       Perception         Praxis  Pertinent Vitals/Pain Pain Assessment Pain Assessment: Faces Faces Pain Scale: Hurts even more Pain Location: R hip/pelvis Pain Descriptors / Indicators: Discomfort, Grimacing, Guarding Pain Intervention(s): Limited activity within patient's tolerance, Monitored during session     Extremity/Trunk Assessment Upper Extremity Assessment Upper Extremity  Assessment: Overall WFL for tasks assessed   Lower Extremity Assessment Lower Extremity Assessment: Defer to PT evaluation   Cervical / Trunk Assessment Cervical / Trunk Assessment: Normal   Communication Communication Communication: Other (comment) Factors Affecting Communication: Reduced clarity of speech (speaks at low volume)   Cognition Arousal: Alert Behavior During Therapy: WFL for tasks assessed/performed Cognition: No apparent impairments                               Following commands: Intact       Cueing  General Comments   Cueing Techniques: Verbal cues  VSS   Exercises     Shoulder Instructions      Home Living Family/patient expects to be discharged to:: Private residence Living Arrangements: Spouse/significant other;Children Available Help at Discharge: Family;Available 24 hours/day Type of Home: Mobile home Home Access: Stairs to enter Entrance Stairs-Number of Steps: 4 Entrance Stairs-Rails: Right;Left;Can reach both Home Layout: One level     Bathroom Shower/Tub: Tub/shower unit;Walk-in shower (uses walk in shower)         Home Equipment: Pharmacist, hospital (2 wheels);BSC/3in1;Cane - single point          Prior Functioning/Environment Prior Level of Function : Driving             Mobility Comments: Ambulates without AD except for when going out in community for extended periods of time pt will use cane. Denies any other falls. ADLs Comments: Mod I with bathing/dressing.    OT Problem List: Decreased strength;Decreased activity tolerance;Impaired balance (sitting and/or standing);Decreased safety awareness;Decreased knowledge of use of DME or AE;Pain   OT Treatment/Interventions: Self-care/ADL training;Therapeutic exercise;DME and/or AE instruction;Therapeutic activities;Patient/family education;Balance training      OT Goals(Current goals can be found in the care plan section)   Acute Rehab OT  Goals Patient Stated Goal: get better OT Goal Formulation: With patient Time For Goal Achievement: 09/08/23 Potential to Achieve Goals: Good   OT Frequency:  Min 2X/week    Co-evaluation              AM-PAC OT 6 Clicks Daily Activity     Outcome Measure Help from another person eating meals?: A Little Help from another person taking care of personal grooming?: A Little Help from another person toileting, which includes using toliet, bedpan, or urinal?: A Little Help from another person bathing (including washing, rinsing, drying)?: A Lot Help from another person to put on and taking off regular upper body clothing?: A Little Help from another person to put on and taking off regular lower body clothing?: A Lot 6 Click Score: 16   End of Session Equipment Utilized During Treatment: Gait belt;Rolling walker (2 wheels) Nurse Communication: Mobility status;Other (comment) (foul smelling urine with pt reporting frequen urination, constant urge; encouraged RN to ask for UA)  Activity Tolerance: Patient tolerated treatment well Patient left: in bed;with call bell/phone within reach;with bed alarm set  OT Visit Diagnosis: Unsteadiness on feet (R26.81);Muscle weakness (generalized) (M62.81);Pain                Time: 1415-1436 OT Time Calculation (min): 21 min Charges:  OT General Charges $OT Visit: 1  Visit OT Evaluation $OT Eval Low Complexity: 1 Low  Elma JONETTA Lebron FREDERICK, OTR/L [facility] Acute Rehabilitation Office: [phone]   Elma JONETTA Lebron 08/25/2023, 4:35 PM

## 2023-08-25 NOTE — TOC Initial Note (Signed)
 Transition of Care [facility]) - Initial/Assessment Note    Patient Details  Name: Rachel Herman MRN: [id number] Date of Birth: 12-Jun-1950  [facility]) CM/SW Contact:    Niels LITTIE Portugal, LCSW Phone Number: 08/25/2023, 3:07 PM  Clinical Narrative:                 CSW received consult for possible SNF placement.  CSW spoke with patient to discuss recommendations. Patient states she lives with her husband at home. Patient states she is agreeable for CSW to initiate SNF workup. Patient states she prefers to be close to home and would prefer to go to [facility] if a bed is available.  Expected Discharge Plan: Skilled Nursing Facility Barriers to Discharge: Continued Medical Work up, English as a second language teacher, SNF Pending bed offer   Patient Goals and CMS Choice Patient states their goals for this hospitalization and ongoing recovery are:: Go to rehab and then home          Expected Discharge Plan and Services       Living arrangements for the past 2 months: Single Family Home                                      Prior Living Arrangements/Services Living arrangements for the past 2 months: Single Family Home     Do you feel safe going back to the place where you live?: Yes      Need for Family Participation in Patient Care: Yes (Comment) Care giver support system in place?: Yes (comment)   Criminal Activity/Legal Involvement Pertinent to Current Situation/Hospitalization: No - Comment as needed  Activities of Daily Living      Permission Sought/Granted                  Emotional Assessment Appearance:: Appears stated age Attitude/Demeanor/Rapport: Engaged Affect (typically observed): Accepting Orientation: : Oriented to Self, Oriented to Place, Oriented to  Time, Oriented to Situation Alcohol / Substance Use: Never Used Psych Involvement: No (comment)  Admission diagnosis:  Syncope [R55] Patient Active Problem List   Diagnosis Date  Noted   Syncope 08/25/2023   Pelvic fracture (HCC) 08/25/2023   Closed fracture of multiple pubic rami (HCC) 08/25/2023   Stage 3a chronic kidney disease (HCC) 03/01/2022   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   Pneumococcal vaccination declined 10/18/2019   Chronic cough 08/03/2018   Other intervertebral disc degeneration, lumbar region 03/18/2018   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/21/2016   Lumbar pain with radiation down left and right leg 07/02/2016   Thyroid  nodule 06/09/2015   Nodule of right lung 07/28/2014   Diverticulosis of colon without hemorrhage 07/28/2014   DJD (degenerative joint disease), lumbar 02/22/2014   Lumbar pain with radiation down left leg 01/17/2014   Breast cancer of upper-inner quadrant of left female breast (HCC) 01/05/2014   Hyperlipidemia associated with type 2 diabetes mellitus (HCC) 09/28/2013   S/P total hysterectomy and bilateral salpingo-oophorectomy 09/20/2013   Essential hypertension    Diabetes mellitus (HCC) 09/16/2013   PCP:  Vicci Barnie NOVAK, MD Pharmacy:   [id number] [address], [place] - [address] [place] [address] Phone: [phone] Fax: [phone]     Social Drivers of Health (SDOH) Social History: SDOH Screenings   Food Insecurity: No Food Insecurity (  04/29/2023)  Recent Concern: Food Insecurity - Food Insecurity Present (03/10/2023)  Housing: Low Risk  (04/29/2023)  Transportation Needs: No Transportation Needs (04/29/2023)  Utilities: At Risk (04/29/2023)  Alcohol Screen: Low Risk  (04/29/2023)  Depression (PHQ2-9): Low Risk  (08/15/2023)  Financial Resource Strain: High Risk (04/29/2023)  Physical Activity: Sufficiently Active (04/29/2023)  Social Connections: Moderately Integrated (04/29/2023)  Stress: Stress Concern Present (04/29/2023)  Tobacco Use: Low Risk  (08/15/2023)  Health Literacy:  Adequate Health Literacy (04/29/2023)   SDOH Interventions:     Readmission Risk Interventions     No data to display

## 2023-08-25 NOTE — Progress Notes (Signed)
 PROGRESS NOTE  Rachel Herman FMW:[id number] DOB: 1950-09-17 DOA: 08/24/2023 PCP: Rachel Barnie NOVAK, MD  HPI/Recap of past 24 hours: Rachel Herman is a 73 y.o. female with medical history significant of breast cancer in remission, DM2, CKD stage IIIa, GERD, HTN, HLD, obesity, chronic back pain/lumbar radiculopathy presenting with syncope and fall resulting in right-sided hip/pelvic pain.  Patient states she was in her usual state of health, carrying some boxes into her house and the next thing she remembers is waking up on the ground.  Denies preceding dizziness, chest pain. She reports history of syncope in the past. In the ED, vital signs stable. Labs notable for WBC count 11.3, X-ray of right hip/pelvis showing marked severity degenerative changes of the right hip but no fracture or dislocation. CT head/C-spine negative for acute findings. CT abdomen pelvis showing nondisplaced right superior and inferior pubic ramus fractures.  Orthopedics consulted.  Patient admitted for further management.    Today, patient complaining of pain in her right hip since the fall, also pain at the back of her head also since the fall.  Fianc at bedside.  Denies any chest pain, shortness of breath, abdominal pain, nausea/vomiting, fever/chills.  Reports urinary frequency plus foul-smelling urine, UA/UC pending.    Assessment/Plan: Principal Problem:   Syncope Active Problems:   Essential hypertension   Hyperlipidemia associated with type 2 diabetes mellitus (HCC)   Stage 3a chronic kidney disease (HCC)   Pelvic fracture (HCC)   Closed fracture of multiple pubic rami (HCC)   Syncope Unknown etiology No prodromal symptoms reported, fell backward hitting head and fracturing right hip Orthostatic vitals pending CT head showing no acute findings Troponin negative, EKG showing sinus rhythm and PVCs Echo showed EF of 60 to 65%, no regional wall motion abnormality, grade 1 diastolic dysfunction   EEG noted to be within normal limits  Carotid Doppler unremarkable Cardiology notified for heart monitor/ZIO to rule out arrhythmia Telemetry, monitor closely  Nondisplaced right superior and inferior pubic ramus fractures In the setting of syncope and fall Orthopedics consulted, nonoperative management, WBAT Continue pain management, PT/OT eval-SNF Follow-up in 2 weeks with outpatient orthopedics  Hypertension BP stable PTA Coreg , clonidine , hydrochlorothiazide  Restart Coreg , hold clonidine  and HCTZ for now pending BP   Type 2 DM with neuropathy A1c 6.0 in February 2025 SSI, Accu-Cheks, hypoglycemic protocol   CKD stage IIIa Creatinine stable   GERD Continue PPI   Hyperlipidemia Continue Lipitor.   Chronic pain/neuropathy Continue gabapentin .   Class I obesity Lifestyle modification advised    Estimated body mass index is 31.12 kg/m as calculated from the following:   Height as of 08/15/23: 5' 5 (1.651 m).   Weight as of 08/15/23: 84.8 kg.     Code Status: Full  Family Communication: Fianc at bedside  Disposition Plan: Status is: Observation The patient remains OBS appropriate and will d/c before 2 midnights.      Consultants: Orthopedics  Procedures: None  Antimicrobials: None  DVT prophylaxis: Lovenox    Objective: Vitals:   08/25/23 0340 08/25/23 0443 08/25/23 0600 08/25/23 0800  BP: (!) 109/98 120/88 127/82 128/80  Pulse: 90 89 79 87  Resp: 16 15 20 20   Temp:    98.8 F (37.1 C)  TempSrc:      SpO2: 97% 96% 94% 96%   No intake or output data in the 24 hours ending 08/25/23 1202 There were no vitals filed for this visit.  Exam: General: NAD  Cardiovascular: S1, S2 present Respiratory:  CTAB Abdomen: Soft, nontender, nondistended, bowel sounds present Musculoskeletal: No bilateral pedal edema noted Skin: Normal Psychiatry: Normal mood     Data Reviewed: CBC: Recent Labs  Lab 08/24/23 1931 08/25/23 0443  WBC 11.3* 10.0   NEUTROABS 8.0*  --   HGB 12.6 11.9*  HCT 39.7 37.2  MCV 91.5 91.2  PLT 265 244   Basic Metabolic Panel: Recent Labs  Lab 08/24/23 1931  NA 140  K 3.5  CL 104  CO2 23  GLUCOSE 143*  BUN 20  CREATININE 1.05*  CALCIUM  9.1   GFR: Estimated Creatinine Clearance: 51.3 mL/min (A) (by C-G formula based on SCr of 1.05 mg/dL (H)). Liver Function Tests: Recent Labs  Lab 08/24/23 1931  AST 26  ALT 13  ALKPHOS 45  BILITOT 0.9  PROT 7.1  ALBUMIN 3.4*   No results for input(s): LIPASE, AMYLASE in the last 168 hours. No results for input(s): AMMONIA in the last 168 hours. Coagulation Profile: No results for input(s): INR, PROTIME in the last 168 hours. Cardiac Enzymes: No results for input(s): CKTOTAL, CKMB, CKMBINDEX, TROPONINI in the last 168 hours. BNP (last 3 results) No results for input(s): PROBNP in the last 8760 hours. HbA1C: No results for input(s): HGBA1C in the last 72 hours. CBG: Recent Labs  Lab 08/25/23 0740  GLUCAP 91   Lipid Profile: No results for input(s): CHOL, HDL, LDLCALC, TRIG, CHOLHDL, LDLDIRECT in the last 72 hours. Thyroid  Function Tests: No results for input(s): TSH, T4TOTAL, FREET4, T3FREE, THYROIDAB in the last 72 hours. Anemia Panel: No results for input(s): VITAMINB12, FOLATE, FERRITIN, TIBC, IRON, RETICCTPCT in the last 72 hours. Urine analysis:    Component Value Date/Time   COLORURINE YELLOW 02/20/2022 1036   APPEARANCEUR CLEAR 02/20/2022 1036   LABSPEC 1.020 02/20/2022 1036   PHURINE 5.5 02/20/2022 1036   GLUCOSEU NEGATIVE 02/20/2022 1036   HGBUR TRACE (A) 02/20/2022 1036   BILIRUBINUR MODERATE (A) 02/20/2022 1036   BILIRUBINUR negative 07/02/2016 1219   KETONESUR 40 (A) 02/20/2022 1036   PROTEINUR 30 (A) 02/20/2022 1036   UROBILINOGEN 0.2 07/02/2016 1219   UROBILINOGEN 0.2 07/20/2014 1310   NITRITE NEGATIVE 02/20/2022 1036   LEUKOCYTESUR NEGATIVE 02/20/2022 1036    Sepsis Labs: @LABRCNTIP (procalcitonin:4,lacticidven:4)  )No results found for this or any previous visit (from the past 240 hours).    Studies: VAS US  CAROTID Result Date: 08/25/2023 Carotid Arterial Duplex Study Patient Name:  Rachel Herman  Date of Exam:   08/25/2023 Medical Rec #: [id number]           Accession #:    [id number] Date of Birth: 12-Apr-1950          Patient Gender: F Patient Age:   73 years Exam Location:  [facility] Procedure:      VAS US  CAROTID Referring Phys: EDITHA RATHORE --------------------------------------------------------------------------------  Indications:       CVA. Risk Factors:      Hypertension, hyperlipidemia, Diabetes, no history of                    smoking. Other Factors:     CKD. Limitations        Today's exam was limited due to the body habitus of the                    patient and poor ultrasound/tissue interface. Comparison Study:  No previous exams on file Performing Technologist: Jody Hill RVT, RDMS  Examination Guidelines: A complete evaluation includes  B-mode imaging, spectral Doppler, color Doppler, and power Doppler as needed of all accessible portions of each vessel. Bilateral testing is considered an integral part of a complete examination. Limited examinations for reoccurring indications may be performed as noted.  Right Carotid Findings: +----------+--------+--------+--------+------------------+--------+           PSV cm/sEDV cm/sStenosisPlaque DescriptionComments +----------+--------+--------+--------+------------------+--------+ CCA Prox  70      22                                         +----------+--------+--------+--------+------------------+--------+ CCA Distal71      22                                         +----------+--------+--------+--------+------------------+--------+ ICA Prox  63      21      1-39%   calcific                    +----------+--------+--------+--------+------------------+--------+ ICA Distal61      25                                         +----------+--------+--------+--------+------------------+--------+ ECA       91      16                                         +----------+--------+--------+--------+------------------+--------+ +----------+--------+-------+----------------+-------------------+           PSV cm/sEDV cmsDescribe        Arm Pressure (mmHG) +----------+--------+-------+----------------+-------------------+ [id number]             Multiphasic, WNL                    +----------+--------+-------+----------------+-------------------+ +---------+--------+--+--------+--+---------+ VertebralPSV cm/s37EDV cm/s13Antegrade +---------+--------+--+--------+--+---------+  Left Carotid Findings: +----------+--------+--------+--------+------------------+--------+           PSV cm/sEDV cm/sStenosisPlaque DescriptionComments +----------+--------+--------+--------+------------------+--------+ CCA Prox  79      25                                         +----------+--------+--------+--------+------------------+--------+ CCA Distal70      21                                         +----------+--------+--------+--------+------------------+--------+ ICA Prox  68      23              focal and calcific         +----------+--------+--------+--------+------------------+--------+ ICA Distal83      35                                         +----------+--------+--------+--------+------------------+--------+ ECA       75      14                                         +----------+--------+--------+--------+------------------+--------+ +----------+--------+--------+----------------+-------------------+  PSV cm/sEDV cm/sDescribe        Arm Pressure (mmHG) +----------+--------+--------+----------------+-------------------+ Subclavian117              Multiphasic, WNL                    +----------+--------+--------+----------------+-------------------+ +---------+--------+--+--------+--+---------+ VertebralPSV cm/s47EDV cm/s20Antegrade +---------+--------+--+--------+--+---------+   Summary: Right Carotid: Velocities in the right ICA are consistent with a 1-39% stenosis. Left Carotid: The extracranial vessels were near-normal with only minimal wall               thickening or plaque. Vertebrals:  Bilateral vertebral arteries demonstrate antegrade flow. Subclavians: Normal flow hemodynamics were seen in bilateral subclavian              arteries. *See table(s) above for measurements and observations.     Preliminary    CT ABDOMEN PELVIS W CONTRAST Result Date: 08/24/2023 EXAM: CT ABDOMEN AND PELVIS WITH CONTRAST 08/24/2023 11:15:10 PM TECHNIQUE: CT of the abdomen and pelvis was performed with the administration of 75 mL of intravenous iohexol  (OMNIPAQUE ) 350 MG/ML. Multiplanar reformatted images are provided for review. Automated exposure control, iterative reconstruction, and/or weight based adjustment of the mA/kV was utilized to reduce the radiation dose to as low as reasonably achievable. COMPARISON: 02/19/2022 CLINICAL HISTORY: Abdominal trauma, blunt. Pt c/o pain with fall; Trauma. FINDINGS: LOWER CHEST: No acute abnormality. LIVER: The liver is unremarkable. GALLBLADDER AND BILE DUCTS: Layering tiny gallstone (image 28), without associated inflammatory changes. No biliary ductal dilatation. SPLEEN: No acute abnormality. PANCREAS: No acute abnormality. ADRENAL GLANDS: No acute abnormality. KIDNEYS, URETERS AND BLADDER: Subcentimeter left anterior interpolar renal cyst (image 33), benign (Bosniak 1). No follow-up recommended. No stones in the kidneys or ureters. No hydronephrosis. No perinephric or periureteral stranding. Urinary bladder is unremarkable. GI AND BOWEL: Normal appendix (image 60). Sigmoid diverticulosis, without evidence of  diverticulitis. Stomach demonstrates no acute abnormality. There is no bowel obstruction. No bowel wall thickening. PERITONEUM AND RETROPERITONEUM: No ascites. No free air. VASCULATURE: Aorta is normal in caliber. LYMPH NODES: No lymphadenopathy. REPRODUCTIVE ORGANS: Status post hysterectomy. No acute abnormality. BONES AND SOFT TISSUES: Postsurgical changes along the lower anterior abdominal wall. Moderate fat-containing periumbilical hernia (image 50). Degenerative changes of the visualized thoracolumbar spine. Suspected global pelvic floor laxity. Nondisplaced right superior and inferior pubic ramus fractures. Mild degenerative changes of the right hip. IMPRESSION: 1. Nondisplaced right superior and inferior pubic ramus fractures. 2. Additional ancillary findings as above. Electronically signed by: Pinkie Pebbles MD 08/24/2023 11:27 PM EDT RP Workstation: [id number]   CT Head Wo Contrast Result Date: 08/24/2023 EXAM: CT HEAD AND CERVICAL SPINE 08/24/2023 11:15:10 PM TECHNIQUE: CT of the head and cervical spine was performed without the administration of intravenous contrast. Multiplanar reformatted images are provided for review. Automated exposure control, iterative reconstruction, and/or weight based adjustment of the mA/kV was utilized to reduce the radiation dose to as low as reasonably achievable. COMPARISON: 12/02/2021 CLINICAL HISTORY: Head trauma, minor (Age >= 65y). Pt c/o pain with fall; Trauma. FINDINGS: CT HEAD BRAIN AND VENTRICLES: No acute intracranial hemorrhage. No mass effect or midline shift. No abnormal extra-axial fluid collection. Gray-white differentiation is maintained. No hydrocephalus. ORBITS: No acute abnormality. SINUSES AND MASTOIDS: No acute abnormality. SOFT TISSUES AND SKULL: No acute skull fracture. No acute soft tissue abnormality. CT CERVICAL SPINE BONES AND ALIGNMENT: No acute fracture or traumatic malalignment. DEGENERATIVE CHANGES: Moderate spinal canal stenosis at C4-5  with mild bilateral foraminal stenosis. SOFT TISSUES: No prevertebral soft tissue swelling. IMPRESSION:  1. No acute intracranial abnormality. 2. No acute fracture or traumatic malalignment of the cervical spine. 3. Moderate spinal canal stenosis at C4-5 with mild bilateral foraminal stenosis. Electronically signed by: Franky Stanford MD 08/24/2023 11:20 PM EDT RP Workstation: [id number]   CT Cervical Spine Wo Contrast Result Date: 08/24/2023 EXAM: CT HEAD AND CERVICAL SPINE 08/24/2023 11:15:10 PM TECHNIQUE: CT of the head and cervical spine was performed without the administration of intravenous contrast. Multiplanar reformatted images are provided for review. Automated exposure control, iterative reconstruction, and/or weight based adjustment of the mA/kV was utilized to reduce the radiation dose to as low as reasonably achievable. COMPARISON: 12/02/2021 CLINICAL HISTORY: Head trauma, minor (Age >= 65y). Pt c/o pain with fall; Trauma. FINDINGS: CT HEAD BRAIN AND VENTRICLES: No acute intracranial hemorrhage. No mass effect or midline shift. No abnormal extra-axial fluid collection. Gray-white differentiation is maintained. No hydrocephalus. ORBITS: No acute abnormality. SINUSES AND MASTOIDS: No acute abnormality. SOFT TISSUES AND SKULL: No acute skull fracture. No acute soft tissue abnormality. CT CERVICAL SPINE BONES AND ALIGNMENT: No acute fracture or traumatic malalignment. DEGENERATIVE CHANGES: Moderate spinal canal stenosis at C4-5 with mild bilateral foraminal stenosis. SOFT TISSUES: No prevertebral soft tissue swelling. IMPRESSION: 1. No acute intracranial abnormality. 2. No acute fracture or traumatic malalignment of the cervical spine. 3. Moderate spinal canal stenosis at C4-5 with mild bilateral foraminal stenosis. Electronically signed by: Franky Stanford MD 08/24/2023 11:20 PM EDT RP Workstation: [id number]   DG Hip Unilat W or Wo Pelvis 2-3 Views Right Result Date: 08/24/2023 CLINICAL DATA:  Status post  fall. EXAM: DG HIP (WITH OR WITHOUT PELVIS) 2-3V RIGHT COMPARISON:  None Available. FINDINGS: There is no evidence of an acute hip fracture or dislocation. Marked severity degenerative changes are seen in the of joint space narrowing, acetabular sclerosis, lateral acetabular bony spurring and subchondral cyst formation. IMPRESSION: Marked severity degenerative changes of the right hip. Electronically Signed   By: Suzen Dials M.D.   On: 08/24/2023 19:48    Scheduled Meds:  atorvastatin   20 mg Oral Daily   gabapentin   600 mg Oral Daily   gabapentin   900 mg Oral QHS   insulin  aspart  0-5 Units Subcutaneous QHS   insulin  aspart  0-9 Units Subcutaneous TID WC   pantoprazole   40 mg Oral Daily    Continuous Infusions:   LOS: 0 days     Rachel JINNY Cage, MD [facility]  If 7PM-7AM, please contact night-coverage [email or web address] 08/25/2023, 12:02 PM

## 2023-08-25 NOTE — Evaluation (Signed)
 Physical Therapy Evaluation Patient Details Name: Rachel Herman MRN: [id number] DOB: 11-13-1950 Today's Date: 08/25/2023  History of Present Illness  Pt is a 73 y/o F admitted on 08/24/23 after presenting with c/o syncope & fall resulting in R sided hip/pelvic pain. CT showed nondisplaced right superior & inferior pubic ramus fractures. Ortho was consulted & recommending non operative management & WBAT. PMH: breast CA in remission, DM2, CKD 3A, GERD, HTN, HLD, obesity, chronic back pain/lumbar radiculopathy  Clinical Impression  Pt seen for PT evaluation with pt agreeable to tx. Pt reports prior to admission she was independent without AD, only used cane when going out in the community for a long period of time. Pt is able to complete bed mobility from ED stretcher with min<>mod assist, sit>stand with min assist, and side steps along EOB with RW & CGA. Pt is doing well for initial assessment s/p pubic ramus fxs. Pt hopeful to go home, reports her son can install a ramp & encouraged her to ask him to start soon. At this time, will recommend post acute rehab <3 hours therapy/day, but pt may progress to HHPT.        If plan is discharge home, recommend the following: A little help with walking and/or transfers;A little help with bathing/dressing/bathroom;Assistance with cooking/housework;Help with stairs or ramp for entrance;Assist for transportation   Can travel by private vehicle   Yes    Equipment Recommendations Wheelchair cushion (measurements PT);Wheelchair (measurements PT)  Recommendations for Other Services  Rehab consult    Functional Status Assessment Patient has had a recent decline in their functional status and demonstrates the ability to make significant improvements in function in a reasonable and predictable amount of time.     Precautions / Restrictions Precautions Precautions: Fall Restrictions Weight Bearing Restrictions Per Provider Order: Yes RLE Weight Bearing  Per Provider Order: Weight bearing as tolerated LLE Weight Bearing Per Provider Order: Weight bearing as tolerated      Mobility  Bed Mobility Overal bed mobility: Needs Assistance Bed Mobility: Supine to Sit, Sit to Supine     Supine to sit: Mod assist Sit to supine: Min assist        Transfers Overall transfer level: Needs assistance Equipment used: Rolling walker (2 wheels) Transfers: Sit to/from Stand Sit to Stand: Min assist           General transfer comment: sit>stand from elevated ED stretcher, cuing re: hand placement    Ambulation/Gait Ambulation/Gait assistance: Contact guard assist Gait Distance (Feet): 6 Feet Assistive device: Rolling walker (2 wheels) Gait Pattern/deviations: Decreased step length - right, Decreased step length - left, Decreased stride length Gait velocity: decreased     General Gait Details: Pt takes side steps along EOB down & back x 1 with RW & CGA, cuing re: use of walker to offload RLE when stepping with LLE.  Stairs            Wheelchair Mobility     Tilt Bed    Modified Rankin (Stroke Patients Only)       Balance Overall balance assessment: Needs assistance Sitting-balance support: Feet supported Sitting balance-Leahy Scale: Fair     Standing balance support: During functional activity, Bilateral upper extremity supported, Reliant on assistive device for balance Standing balance-Leahy Scale: Fair                               Pertinent Vitals/Pain Pain Assessment Pain Assessment: Faces  Faces Pain Scale: Hurts whole lot Pain Location: R hip/pelvis Pain Descriptors / Indicators: Discomfort, Grimacing, Guarding Pain Intervention(s): Monitored during session, Premedicated before session, Limited activity within patient's tolerance, Repositioned    Home Living Family/patient expects to be discharged to:: Private residence Living Arrangements: Spouse/significant other;Children Available Help at  Discharge: Family;Available 24 hours/day Type of Home: Mobile home Home Access: Stairs to enter Entrance Stairs-Rails: Right;Left;Can reach both Entrance Stairs-Number of Steps: 4   Home Layout: One level Home Equipment: Pharmacist, hospital (2 wheels);BSC/3in1;Cane - single point      Prior Function Prior Level of Function : Driving             Mobility Comments: Ambulates without AD except for when going out in community for extended periods of time pt will use cane. Denies any other falls. ADLs Comments: Mod I with bathing/dressing.     Extremity/Trunk Assessment   Upper Extremity Assessment Upper Extremity Assessment: Overall WFL for tasks assessed    Lower Extremity Assessment Lower Extremity Assessment: RLE deficits/detail RLE Deficits / Details: 2+/5 knee extension in sitting RLE: Unable to fully assess due to pain    Cervical / Trunk Assessment Cervical / Trunk Assessment: Normal  Communication   Communication Factors Affecting Communication: Reduced clarity of speech (speaks at low volume)    Cognition Arousal: Alert Behavior During Therapy: WFL for tasks assessed/performed   PT - Cognitive impairments: No apparent impairments                         Following commands: Intact       Cueing Cueing Techniques: Verbal cues     General Comments      Exercises     Assessment/Plan    PT Assessment Patient needs continued PT services  PT Problem List Decreased strength;Pain;Decreased range of motion;Decreased activity tolerance;Decreased balance;Decreased knowledge of use of DME;Decreased knowledge of precautions;Decreased mobility       PT Treatment Interventions DME instruction;Balance training;Neuromuscular re-education;Stair training;Functional mobility training;Gait training;Patient/family education;Therapeutic activities;Therapeutic exercise;Manual techniques;Modalities    PT Goals (Current goals can be found in the Care Plan  section)  Acute Rehab PT Goals Patient Stated Goal: decreased pain, go home PT Goal Formulation: With patient Time For Goal Achievement: 09/08/23 Potential to Achieve Goals: Fair    Frequency Min 3X/week     Co-evaluation               AM-PAC PT 6 Clicks Mobility  Outcome Measure Help needed turning from your back to your side while in a flat bed without using bedrails?: A Little Help needed moving from lying on your back to sitting on the side of a flat bed without using bedrails?: A Lot Help needed moving to and from a bed to a chair (including a wheelchair)?: A Little Help needed standing up from a chair using your arms (e.g., wheelchair or bedside chair)?: A Little Help needed to walk in hospital room?: Total Help needed climbing 3-5 steps with a railing? : Total 6 Click Score: 13    End of Session   Activity Tolerance: Patient tolerated treatment well;Patient limited by pain (transport present, waiting to take pt to vascular imaging) Patient left: in bed;with call bell/phone within reach Nurse Communication: Mobility status PT Visit Diagnosis: Pain;Other abnormalities of gait and mobility (R26.89);Difficulty in walking, not elsewhere classified (R26.2);Unsteadiness on feet (R26.81);Muscle weakness (generalized) (M62.81) Pain - Right/Left: Right Pain - part of body: Hip    Time: 9142-9083 PT  Time Calculation (min) (ACUTE ONLY): 19 min   Charges:   PT Evaluation $PT Eval Moderate Complexity: 1 Mod   PT General Charges $$ ACUTE PT VISIT: 1 Visit         [name], PT, DPT 08/25/23, 9:29 AM   [name] 08/25/2023, 9:27 AM

## 2023-08-25 NOTE — Consult Note (Signed)
 Orthopedic Surgery Consult Note  Assessment: Patient is a 73 y.o. female with pelvic ring injury   Plan: -Operative plans: none -Okay for diet and dvt ppx from ortho perspective -Weight bearing status: as tolerated  -PT evaluate and treat -Pain control -Follow up in 2 weeks with orthopedics outpatient  ___________________________________________________________________________   Reason for consult: right superior and inferior pubic rami fractures  History:  Patient is a 73 y.o. female who was carrying some boxes yesterday when she had a syncopal episode. She regained consciousness on the ground and had right hip pain. She was admitted to the hospitalist service overnight. Reporting right hip and groin pain. No pain elsewhere.   Past medical history:  Breast cancer in remission DM CKD HTN GERD HLD Chronic low back pain  Allergies: NKDA   Past surgical history:  Hysterectomy Breast lumpectomy Salpingo-oophorectomy L3/4 right-sided microdiscectomy Cataract surgery  Social history: Denies use of nicotine-containing products (cigarettes, vaping, smokeless, etc.) Alcohol use: denies Denies use of recreational drugs  Family history: -reviewed and not pertinent to rami fractures   Physical Exam:  General: no acute distress, appears stated age Neurologic: alert, answering questions appropriately, following commands Cardiovascular: regular rate, no cyanosis Respiratory: unlabored breathing on room air, symmetric chest rise Psychiatric: appropriate affect, normal cadence to speech  MSK:   -Bilateral upper extremities  No tenderness to palpation over extremity, no gross deformity, no open wounds Fires deltoid, biceps, triceps, wrist extensors, wrist flexors, finger extensors, finger flexors  AIN/PIN/IO intact  Palpable radial pulse  Sensation intact to light touch in median/ulnar/radial/axillary nerve distributions  Hand warm and well perfused  -Bilateral lower  extremities  No tenderness to palpation over extremity, except around the hips particularly on the right. Pain with log roll on the right, no pain with log roll on the left. No gross deformity. No open wounds Fires hip flexors, quadriceps, hamstrings, tibialis anterior, gastrocnemius and soleus, extensor hallucis longus Plantarflexes and dorsiflexes toes Sensation intact to light touch in sural, saphenous, tibial, deep peroneal, and superficial peroneal nerve distributions Foot warm and well perfused  Imaging: XRs of the right hip and pelvis from 08/24/2023 were independently reviewed and interpreted, showing significant degenerative changes within the right hip joint with joint space narrowing, subchondral sclerosis, and osteophyte formation. There is arthritis is bone-on-bone. No fracture or dislocation seen.   CT of the abdomen/pelvis from 08/24/2023 was independently reviewed and interpreted, showing nondisplaced superior and inferior pubic rami fractures on the right. No other fractures seen. No dislocation.    Patient name: Rachel Herman Patient MRN: [id number] Date: 08/25/23

## 2023-08-25 NOTE — Procedures (Signed)
 Patient Name: Rachel Herman  MRN: [id number]  Epilepsy Attending: Arlin MALVA Krebs  Referring Physician/Provider: Alfornia Madison, MD  Date: 08/25/2023 Duration: 22.32 mins  Patient history: 72to F with syncope. EEG to evaluate for seizure  Level of alertness: Awake, asleep  AEDs during EEG study: GBP  Technical aspects: This EEG study was done with scalp electrodes positioned according to the 10-20 International system of electrode placement. Electrical activity was reviewed with band pass filter of 1-70Hz , sensitivity of 7 uV/mm, display speed of 76mm/sec with a 60Hz  notched filter applied as appropriate. EEG data were recorded continuously and digitally stored.  Video monitoring was available and reviewed as appropriate.  Description: The posterior dominant rhythm consists of 9-10 Hz activity of moderate voltage (25-35 uV) seen predominantly in posterior head regions, symmetric and reactive to eye opening and eye closing. Sleep was characterized by vertex waves, sleep spindles (12 to 14 Hz), maximal frontocentral region.   Hyperventilation and photic stimulation were not performed.     IMPRESSION: This study is within normal limits. No seizures or epileptiform discharges were seen throughout the recording.  A normal interictal EEG does not exclude the diagnosis of epilepsy.   [name] O Azeez Dunker

## 2023-08-25 NOTE — ED Notes (Signed)
Checked patient cbg it was 30 notified RN of blood sugar

## 2023-08-25 NOTE — NC FL2 (Signed)
 [place]  MEDICAID FL2 LEVEL OF CARE FORM     IDENTIFICATION  Patient Name: Rachel Herman Birthdate: 1950-09-01 Sex: female Admission Date (Current Location): 08/24/2023  [facility] and [place] Number:  Producer, television/film/video and Address:  [facility] [place], [address], [place], [place] [address]      Provider Number: [id number]  Attending Physician Name and Address:  Donnamarie Lebron PARAS, MD  Relative Name and Phone Number:       Current Level of Care: Hospital Recommended Level of Care: Skilled Nursing Facility Prior Approval Number:    Date Approved/Denied:   PASRR Number: [id number]  Discharge Plan: SNF    Current Diagnoses: Patient Active Problem List   Diagnosis Date Noted   Syncope 08/25/2023   Pelvic fracture (HCC) 08/25/2023   Closed fracture of multiple pubic rami (HCC) 08/25/2023   Stage 3a chronic kidney disease (HCC) 03/01/2022   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   Pneumococcal vaccination declined 10/18/2019   Chronic cough 08/03/2018   Other intervertebral disc degeneration, lumbar region 03/18/2018   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/21/2016   Lumbar pain with radiation down left and right leg 07/02/2016   Thyroid  nodule 06/09/2015   Nodule of right lung 07/28/2014   Diverticulosis of colon without hemorrhage 07/28/2014   DJD (degenerative joint disease), lumbar 02/22/2014   Lumbar pain with radiation down left leg 01/17/2014   Breast cancer of upper-inner quadrant of left female breast (HCC) 01/05/2014   Hyperlipidemia associated with type 2 diabetes mellitus (HCC) 09/28/2013   S/P total hysterectomy and bilateral salpingo-oophorectomy 09/20/2013   Essential hypertension    Diabetes mellitus (HCC) 09/16/2013    Orientation RESPIRATION BLADDER Height & Weight     Self, Time, Situation, Place  Normal Continent Weight:   Height:     BEHAVIORAL  SYMPTOMS/MOOD NEUROLOGICAL BOWEL NUTRITION STATUS      Continent Diet (See discharge summary)  AMBULATORY STATUS COMMUNICATION OF NEEDS Skin   Extensive Assist Verbally Normal                       Personal Care Assistance Level of Assistance  Bathing, Feeding, Dressing Bathing Assistance: Limited assistance Feeding assistance: Independent Dressing Assistance: Limited assistance     Functional Limitations Info  Sight, Hearing, Speech Sight Info: Adequate Hearing Info: Adequate Speech Info: Adequate    SPECIAL CARE FACTORS FREQUENCY  PT (By licensed PT), OT (By licensed OT)     PT Frequency: 5x weekly OT Frequency: 5x weekly            Contractures Contractures Info: Not present    Additional Factors Info  Code Status, Allergies Code Status Info: Full Code Allergies Info: No known allergies           Current Medications (08/25/2023):  This is the current hospital active medication list Current Facility-Administered Medications  Medication Dose Route Frequency Provider Last Rate Last Admin   acetaminophen  (TYLENOL ) tablet 650 mg  650 mg Oral Q6H PRN Alfornia Madison, MD       Or   acetaminophen  (TYLENOL ) suppository 650 mg  650 mg Rectal Q6H PRN Alfornia Madison, MD       atorvastatin  (LIPITOR) tablet 20 mg  20 mg Oral Daily Rathore, Vasundhra, MD   20 mg at 08/25/23 0847   carvedilol  (COREG ) tablet 12.5 mg  12.5 mg Oral BID WC Ezenduka, Nkeiruka J, MD  enoxaparin  (LOVENOX ) injection 40 mg  40 mg Subcutaneous Q24H Ezenduka, Nkeiruka J, MD       gabapentin  (NEURONTIN ) capsule 600 mg  600 mg Oral Daily Rathore, Vasundhra, MD   600 mg at 08/25/23 9152   gabapentin  (NEURONTIN ) capsule 900 mg  900 mg Oral QHS Alfornia Madison, MD       insulin  aspart (novoLOG ) injection 0-5 Units  0-5 Units Subcutaneous QHS Rathore, Vasundhra, MD       insulin  aspart (novoLOG ) injection 0-9 Units  0-9 Units Subcutaneous TID WC Alfornia Madison, MD       morphine  (PF) 2  MG/ML injection 1 mg  1 mg Intravenous Q4H PRN Rathore, Vasundhra, MD   1 mg at 08/25/23 9145   naloxone  (NARCAN ) injection 0.4 mg  0.4 mg Intravenous PRN Rathore, Vasundhra, MD       oxyCODONE  (Oxy IR/ROXICODONE ) immediate release tablet 5 mg  5 mg Oral Q6H PRN Alfornia Madison, MD       pantoprazole  (PROTONIX ) EC tablet 40 mg  40 mg Oral Daily Rathore, Vasundhra, MD   40 mg at 08/25/23 9152   Current Outpatient Medications  Medication Sig Dispense Refill   acetaminophen  (TYLENOL ) 500 MG tablet Take 1,000 mg by mouth at bedtime.     albuterol  (VENTOLIN  HFA) 108 (90 Base) MCG/ACT inhaler Inhale 2 puffs into the lungs every 6 (six) hours as needed for wheezing or shortness of breath. 8 g 3   aspirin EC 325 MG tablet Take 325 mg by mouth daily.     atorvastatin  (LIPITOR) 20 MG tablet TAKE 1 TABLET BY MOUTH DAILY. STOP PRAVASTATIN  90 tablet 1   cloNIDine  (CATAPRES ) 0.1 MG tablet Take 1 tablet (0.1 mg total) by mouth 2 (two) times daily. 180 tablet 1   gabapentin  (NEURONTIN ) 300 MG capsule 2 tabs PO Q a.m and 3 tabs PO Q p.m (Patient taking differently: Take 600-900 mg by mouth See admin instructions. Take 2 capsules by mouth in the morning and 3 capsules at night) 450 capsule 1   hydrochlorothiazide  (HYDRODIURIL ) 25 MG tablet Take 1 tablet (25 mg total) by mouth daily. 90 tablet 1   metFORMIN  (GLUCOPHAGE ) 500 MG tablet TAKE 1 & 1/2 TABLETS (750 MG) BY MOUTH 2 TIMES DAILY. 270 tablet 2   Multiple Vitamins-Minerals (MULTIVITAMIN WITH MINERALS) tablet Take 1 tablet by mouth daily. Reported on 03/13/2015     omeprazole  (PRILOSEC) 20 MG capsule Take 1 capsule (20 mg total) by mouth 2 (two) times daily before a meal. 180 capsule 1   potassium chloride  (KLOR-CON ) 10 MEQ tablet Take 1 tablet (10 mEq total) by mouth daily. 90 tablet 1   traMADol  (ULTRAM ) 50 MG tablet TAKE 1 TABLET EVERY 4 HOURS AS NEEDED (EACH PRESCRIPTION TO LAST 1 MONTH). 120 tablet 1   Accu-Chek Softclix Lancets lancets Use as instructed  100 each 12   Ascorbic Acid (VITAMIN C) 1000 MG tablet Take 1,000 mg by mouth daily. (Patient not taking: Reported on 08/25/2023)     Blood Glucose Monitoring Suppl (ACCU-CHEK GUIDE) w/Device KIT UAD to check BS once a day 1 kit 0   carvedilol  (COREG ) 12.5 MG tablet Take 1 tablet (12.5 mg total) by mouth 2 (two) times daily with a meal. 180 tablet 2   glucose blood (ACCU-CHEK GUIDE TEST) test strip Use as instructed to check BS once a day 100 each 12     Discharge Medications: Please see discharge summary for a list of discharge medications.  Relevant Imaging Results:  Relevant Lab Results:   Additional Information SSN: [id number]  Niels LITTIE Portugal, LCSW

## 2023-08-25 NOTE — Progress Notes (Signed)
 EEG complete - results pending

## 2023-08-25 NOTE — H&P (Signed)
 History and Physical    Rachel Herman FMW:[id number] DOB: 12/02/1950 DOA: 08/24/2023  PCP: Vicci Barnie NOVAK, MD  Patient coming from: Home  Chief Complaint: Syncope, right-sided hip/pelvic pain  HPI: Rachel Herman is a 73 y.o. female with medical history significant of breast cancer in remission, type 2 diabetes, CKD stage IIIa, GERD, hypertension, hyperlipidemia, obesity, chronic back pain/lumbar radiculopathy presenting with syncope and fall resulting in right-sided hip/pelvic pain.  Patient states she was in her usual state of health, carrying some boxes into her house and the next thing she remembers is waking up on the ground.  Denies preceding lightheadedness/dizziness, chest pain, or shortness of breath.  She reports history of syncope in the past.  Denies history of seizures or blood clots.  No other complaints.  Denies vomiting, abdominal pain, or diarrhea.  ED Course: Vital signs stable. Labs notable for WBC count 11.3, glucose 143, creatinine 1.0 (stable). X-ray of right hip/pelvis showing marked severity degenerative changes of the right hip but no fracture or dislocation. CT head/C-spine negative for acute findings. CT abdomen pelvis showing nondisplaced right superior and inferior pubic ramus fractures. EKG showing sinus rhythm and PVCs. Patient was given morphine  and Zofran . TRH called to admit.   Review of Systems:  Review of Systems  All other systems reviewed and are negative.   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   Diabetes mellitus (HCC) 09/16/13   Diagnosed on 09/16/13; HgA1C was 7.2/metformin    Dizziness    GERD (gastroesophageal reflux disease)    Headache    Hyperlipidemia    Hypertension 2014   Low back pain    Obesity, morbid, BMI 40.0-49.9 (HCC)    PONV (postoperative nausea and vomiting)    S/P radiation therapy 04/05/14-05/20/14   left breast 60.4Gy totaldose    Past  Surgical History:  Procedure Laterality Date   ABDOMINAL HYSTERECTOMY N/A 09/20/2013   Procedure: HYSTERECTOMY ABDOMINAL;  Surgeon: Gloris DELENA Hugger, MD;  Location: [facility];  Service: Gynecology;  Laterality: N/A;   BREAST LUMPECTOMY Left 2015   radiation   BREAST LUMPECTOMY WITH AXILLARY LYMPH NODE BIOPSY Left 12/29/13   left breast    CATARACT EXTRACTION Right    LAPAROSCOPIC ASSISTED VAGINAL HYSTERECTOMY  09/20/2013   Procedure: LAPAROSCOPIC ASSISTED VAGINAL HYSTERECTOMY;  Surgeon: Gloris DELENA Hugger, MD;  Location: [facility];  Service: Gynecology;;  PT WAS EXAMINED WHILE UP IN [place] AND IT WAS DECIDED TO OPEN PT DUE TO LARGE MASS   LUMBAR LAMINECTOMY/DECOMPRESSION MICRODISCECTOMY Right 12/14/2014   Procedure: Right Lumbar three-four microdiskectomy;  Surgeon: Reyes Budge, MD;  Location: [facility];  Service: Neurosurgery;  Laterality: Right;  Right Lumbar three-four microdiskectomy   RADIOACTIVE SEED GUIDED PARTIAL MASTECTOMY WITH AXILLARY SENTINEL LYMPH NODE BIOPSY Left 12/29/2013   Procedure: LEFT BREAST RADIOACTIVE SEED LOCALIZED LUMPECTOMY WITH SENTINEL LYMPH NODE MAPPING;  Surgeon: Debby Shipper, MD;  Location: [facility];  Service: General;  Laterality: Left;   RE-EXCISION OF BREAST LUMPECTOMY Left 03/02/2014   Procedure: RE-EXCISION OF LEFT BREAST LUMPECTOMY;  Surgeon: Debby Shipper, MD;  Location:  SURGERY CENTER;  Service: General;  Laterality: Left;   SALPINGOOPHORECTOMY  09/20/2013   Procedure: SALPINGO OOPHORECTOMY;  Surgeon: Gloris DELENA Hugger, MD;  Location: [facility];  Service: Gynecology;;     reports that she has never smoked. She has never used smokeless tobacco. She reports that she does not drink alcohol and does not  use drugs.  No Known Allergies  Family History  Problem Relation Age of Onset   Diabetes Mother    Multiple myeloma Mother 19   Hearing loss Mother    Diabetes Brother    Cancer Brother 70       prostate cancer    Diabetes  Maternal Aunt    Leukemia Maternal Aunt        dx in her 74s   Alcoholism Brother    Heart attack Father    Diabetes Sister    Diabetes Son     Prior to Admission medications   Medication Sig Start Date End Date Taking? Authorizing Provider  acetaminophen  (TYLENOL ) 500 MG tablet Take 1,000 mg by mouth at bedtime.   Yes [provider]  albuterol  (VENTOLIN  HFA) 108 (90 Base) MCG/ACT inhaler Inhale 2 puffs into the lungs every 6 (six) hours as needed for wheezing or shortness of breath. 07/03/22  Yes McClung, Jon HERO, PA-C  aspirin EC 325 MG tablet Take 325 mg by mouth daily.   Yes [provider]  atorvastatin  (LIPITOR) 20 MG tablet TAKE 1 TABLET BY MOUTH DAILY. STOP PRAVASTATIN  08/15/23  Yes Vicci Barnie NOVAK, MD  cloNIDine  (CATAPRES ) 0.1 MG tablet Take 1 tablet (0.1 mg total) by mouth 2 (two) times daily. 08/15/23  Yes Vicci Barnie NOVAK, MD  gabapentin  (NEURONTIN ) 300 MG capsule 2 tabs PO Q a.m and 3 tabs PO Q p.m Patient taking differently: Take 600-900 mg by mouth See admin instructions. Take 2 capsules by mouth in the morning and 3 capsules at night 08/15/23  Yes Vicci Barnie NOVAK, MD  hydrochlorothiazide  (HYDRODIURIL ) 25 MG tablet Take 1 tablet (25 mg total) by mouth daily. 08/15/23  Yes Vicci Barnie NOVAK, MD  metFORMIN  (GLUCOPHAGE ) 500 MG tablet TAKE 1 & 1/2 TABLETS (750 MG) BY MOUTH 2 TIMES DAILY. 05/12/23  Yes Vicci Barnie NOVAK, MD  Multiple Vitamins-Minerals (MULTIVITAMIN WITH MINERALS) tablet Take 1 tablet by mouth daily. Reported on 03/13/2015   Yes [provider]  omeprazole  (PRILOSEC) 20 MG capsule Take 1 capsule (20 mg total) by mouth 2 (two) times daily before a meal. 08/15/23  Yes Vicci Barnie NOVAK, MD  potassium chloride  (KLOR-CON ) 10 MEQ tablet Take 1 tablet (10 mEq total) by mouth daily. 08/15/23  Yes Vicci Barnie NOVAK, MD  traMADol  (ULTRAM ) 50 MG tablet TAKE 1 TABLET EVERY 4 HOURS AS NEEDED (EACH PRESCRIPTION TO LAST 1 MONTH). 08/06/23  Yes Vicci Barnie NOVAK, MD  Accu-Chek Softclix Lancets lancets Use as instructed 03/10/23   Vicci Barnie NOVAK, MD  Ascorbic Acid (VITAMIN C) 1000 MG tablet Take 1,000 mg by mouth daily. Patient not taking: Reported on 08/25/2023    [provider]  Blood Glucose Monitoring Suppl (ACCU-CHEK GUIDE) w/Device KIT UAD to check BS once a day 03/10/23   Vicci Barnie NOVAK, MD  carvedilol  (COREG ) 12.5 MG tablet Take 1 tablet (12.5 mg total) by mouth 2 (two) times daily with a meal. 08/15/23   Vicci Barnie NOVAK, MD  glucose blood (ACCU-CHEK GUIDE TEST) test strip Use as instructed to check BS once a day 03/10/23   Vicci Barnie NOVAK, MD    Physical Exam: Vitals:   08/25/23 0230 08/25/23 0245 08/25/23 0339 08/25/23 0340  BP: 122/80 111/61  (!) 109/98  Pulse: 91 91  90  Resp: 16 16  16   Temp: 97.9 F (36.6 C)  98.5 F (36.9 C)   TempSrc: Oral  Oral   SpO2: 96% 92%  97%    Physical Exam Vitals reviewed.  Constitutional:      General: She is not in acute distress. HENT:     Head: Normocephalic and atraumatic.  Eyes:     Extraocular Movements: Extraocular movements intact.  Cardiovascular:     Rate and Rhythm: Normal rate and regular rhythm.     Pulses: Normal pulses.  Pulmonary:     Effort: Pulmonary effort is normal. No respiratory distress.     Breath sounds: Normal breath sounds. No wheezing, rhonchi or rales.  Abdominal:     General: Bowel sounds are normal.     Palpations: Abdomen is soft.     Tenderness: There is no abdominal tenderness. There is no guarding.  Musculoskeletal:     Cervical back: Normal range of motion.     Right lower leg: No edema.     Left lower leg: No edema.  Skin:    General: Skin is warm and dry.  Neurological:     General: No focal deficit present.     Mental Status: She is alert and oriented to person, place, and time.     Labs on Admission: I have personally reviewed following labs and imaging studies  CBC: Recent Labs  Lab 08/24/23 1931  WBC 11.3*   NEUTROABS 8.0*  HGB 12.6  HCT 39.7  MCV 91.5  PLT 265   Basic Metabolic Panel: Recent Labs  Lab 08/24/23 1931  NA 140  K 3.5  CL 104  CO2 23  GLUCOSE 143*  BUN 20  CREATININE 1.05*  CALCIUM  9.1   GFR: Estimated Creatinine Clearance: 51.3 mL/min (A) (by C-G formula based on SCr of 1.05 mg/dL (H)). Liver Function Tests: Recent Labs  Lab 08/24/23 1931  AST 26  ALT 13  ALKPHOS 45  BILITOT 0.9  PROT 7.1  ALBUMIN 3.4*   No results for input(s): LIPASE, AMYLASE in the last 168 hours. No results for input(s): AMMONIA in the last 168 hours. Coagulation Profile: No results for input(s): INR, PROTIME in the last 168 hours. Cardiac Enzymes: No results for input(s): CKTOTAL, CKMB, CKMBINDEX, TROPONINI in the last 168 hours. BNP (last 3 results) No results for input(s): PROBNP in the last 8760 hours. HbA1C: No results for input(s): HGBA1C in the last 72 hours. CBG: No results for input(s): GLUCAP in the last 168 hours. Lipid Profile: No results for input(s): CHOL, HDL, LDLCALC, TRIG, CHOLHDL, LDLDIRECT in the last 72 hours. Thyroid  Function Tests: No results for input(s): TSH, T4TOTAL, FREET4, T3FREE, THYROIDAB in the last 72 hours. Anemia Panel: No results for input(s): VITAMINB12, FOLATE, FERRITIN, TIBC, IRON, RETICCTPCT in the last 72 hours. Urine analysis:    Component Value Date/Time   COLORURINE YELLOW 02/20/2022 1036   APPEARANCEUR CLEAR 02/20/2022 1036   LABSPEC 1.020 02/20/2022 1036   PHURINE 5.5 02/20/2022 1036   GLUCOSEU NEGATIVE 02/20/2022 1036   HGBUR TRACE (A) 02/20/2022 1036   BILIRUBINUR MODERATE (A) 02/20/2022 1036   BILIRUBINUR negative 07/02/2016 1219   KETONESUR 40 (A) 02/20/2022 1036   PROTEINUR 30 (A) 02/20/2022 1036   UROBILINOGEN 0.2 07/02/2016 1219   UROBILINOGEN 0.2 07/20/2014 1310   NITRITE NEGATIVE 02/20/2022 1036   LEUKOCYTESUR NEGATIVE 02/20/2022 1036    Radiological Exams  on Admission: CT ABDOMEN PELVIS W CONTRAST Result Date: 08/24/2023 EXAM: CT ABDOMEN AND PELVIS WITH CONTRAST 08/24/2023 11:15:10 PM TECHNIQUE: CT of the abdomen and pelvis was performed with the administration of 75 mL of intravenous iohexol  (OMNIPAQUE ) 350 MG/ML. Multiplanar reformatted images  are provided for review. Automated exposure control, iterative reconstruction, and/or weight based adjustment of the mA/kV was utilized to reduce the radiation dose to as low as reasonably achievable. COMPARISON: 02/19/2022 CLINICAL HISTORY: Abdominal trauma, blunt. Pt c/o pain with fall; Trauma. FINDINGS: LOWER CHEST: No acute abnormality. LIVER: The liver is unremarkable. GALLBLADDER AND BILE DUCTS: Layering tiny gallstone (image 28), without associated inflammatory changes. No biliary ductal dilatation. SPLEEN: No acute abnormality. PANCREAS: No acute abnormality. ADRENAL GLANDS: No acute abnormality. KIDNEYS, URETERS AND BLADDER: Subcentimeter left anterior interpolar renal cyst (image 33), benign (Bosniak 1). No follow-up recommended. No stones in the kidneys or ureters. No hydronephrosis. No perinephric or periureteral stranding. Urinary bladder is unremarkable. GI AND BOWEL: Normal appendix (image 60). Sigmoid diverticulosis, without evidence of diverticulitis. Stomach demonstrates no acute abnormality. There is no bowel obstruction. No bowel wall thickening. PERITONEUM AND RETROPERITONEUM: No ascites. No free air. VASCULATURE: Aorta is normal in caliber. LYMPH NODES: No lymphadenopathy. REPRODUCTIVE ORGANS: Status post hysterectomy. No acute abnormality. BONES AND SOFT TISSUES: Postsurgical changes along the lower anterior abdominal wall. Moderate fat-containing periumbilical hernia (image 50). Degenerative changes of the visualized thoracolumbar spine. Suspected global pelvic floor laxity. Nondisplaced right superior and inferior pubic ramus fractures. Mild degenerative changes of the right hip. IMPRESSION: 1.  Nondisplaced right superior and inferior pubic ramus fractures. 2. Additional ancillary findings as above. Electronically signed by: Pinkie Pebbles MD 08/24/2023 11:27 PM EDT RP Workstation: [id number]   CT Head Wo Contrast Result Date: 08/24/2023 EXAM: CT HEAD AND CERVICAL SPINE 08/24/2023 11:15:10 PM TECHNIQUE: CT of the head and cervical spine was performed without the administration of intravenous contrast. Multiplanar reformatted images are provided for review. Automated exposure control, iterative reconstruction, and/or weight based adjustment of the mA/kV was utilized to reduce the radiation dose to as low as reasonably achievable. COMPARISON: 12/02/2021 CLINICAL HISTORY: Head trauma, minor (Age >= 65y). Pt c/o pain with fall; Trauma. FINDINGS: CT HEAD BRAIN AND VENTRICLES: No acute intracranial hemorrhage. No mass effect or midline shift. No abnormal extra-axial fluid collection. Gray-white differentiation is maintained. No hydrocephalus. ORBITS: No acute abnormality. SINUSES AND MASTOIDS: No acute abnormality. SOFT TISSUES AND SKULL: No acute skull fracture. No acute soft tissue abnormality. CT CERVICAL SPINE BONES AND ALIGNMENT: No acute fracture or traumatic malalignment. DEGENERATIVE CHANGES: Moderate spinal canal stenosis at C4-5 with mild bilateral foraminal stenosis. SOFT TISSUES: No prevertebral soft tissue swelling. IMPRESSION: 1. No acute intracranial abnormality. 2. No acute fracture or traumatic malalignment of the cervical spine. 3. Moderate spinal canal stenosis at C4-5 with mild bilateral foraminal stenosis. Electronically signed by: Franky Stanford MD 08/24/2023 11:20 PM EDT RP Workstation: [id number]   CT Cervical Spine Wo Contrast Result Date: 08/24/2023 EXAM: CT HEAD AND CERVICAL SPINE 08/24/2023 11:15:10 PM TECHNIQUE: CT of the head and cervical spine was performed without the administration of intravenous contrast. Multiplanar reformatted images are provided for review. Automated  exposure control, iterative reconstruction, and/or weight based adjustment of the mA/kV was utilized to reduce the radiation dose to as low as reasonably achievable. COMPARISON: 12/02/2021 CLINICAL HISTORY: Head trauma, minor (Age >= 65y). Pt c/o pain with fall; Trauma. FINDINGS: CT HEAD BRAIN AND VENTRICLES: No acute intracranial hemorrhage. No mass effect or midline shift. No abnormal extra-axial fluid collection. Gray-white differentiation is maintained. No hydrocephalus. ORBITS: No acute abnormality. SINUSES AND MASTOIDS: No acute abnormality. SOFT TISSUES AND SKULL: No acute skull fracture. No acute soft tissue abnormality. CT CERVICAL SPINE BONES AND ALIGNMENT: No acute fracture or traumatic malalignment. DEGENERATIVE  CHANGES: Moderate spinal canal stenosis at C4-5 with mild bilateral foraminal stenosis. SOFT TISSUES: No prevertebral soft tissue swelling. IMPRESSION: 1. No acute intracranial abnormality. 2. No acute fracture or traumatic malalignment of the cervical spine. 3. Moderate spinal canal stenosis at C4-5 with mild bilateral foraminal stenosis. Electronically signed by: Franky Stanford MD 08/24/2023 11:20 PM EDT RP Workstation: [id number]   DG Hip Unilat W or Wo Pelvis 2-3 Views Right Result Date: 08/24/2023 CLINICAL DATA:  Status post fall. EXAM: DG HIP (WITH OR WITHOUT PELVIS) 2-3V RIGHT COMPARISON:  None Available. FINDINGS: There is no evidence of an acute hip fracture or dislocation. Marked severity degenerative changes are seen in the of joint space narrowing, acetabular sclerosis, lateral acetabular bony spurring and subchondral cyst formation. IMPRESSION: Marked severity degenerative changes of the right hip. Electronically Signed   By: Suzen Dials M.D.   On: 08/24/2023 19:48    Assessment and Plan  Syncope No prodromal symptoms reported.  Not hypoglycemic.  CT head showing no acute findings.  Vital signs stable in the ED.  PE less likely given no tachycardia or hypoxia.  No  clinical signs of DVT on exam.  EKG showing sinus rhythm and PVCs.  Check troponin.  Continue cardiac monitoring.  Echocardiogram, EEG, and carotid Doppler ordered.  Hold home diuretics/antihypertensives at this time and check orthostatics, fall precautions.  Nondisplaced right superior and inferior pubic ramus fractures In the setting of syncope and fall.  Continue pain management, PT/OT eval.  Consult orthopedics in the morning.  Borderline leukocytosis Likely reactive.  No infectious signs or symptoms.  Monitor CBC.  Type 2 diabetes Well-controlled-A1c 6.0 in February 2025.  Placed on sensitive sliding scale insulin  ACHS.  CKD stage IIIa Creatinine stable.  GERD Continue PPI.  Hypertension Hold antihypertensives at this time.  Hyperlipidemia Continue Lipitor.  Chronic pain/neuropathy Continue gabapentin .  DVT prophylaxis: SCDs Code Status: Full Code (discussed with the patient) Level of care: Telemetry bed Admission status: It is my clinical opinion that referral for OBSERVATION is reasonable and necessary in this patient based on the above information provided. The aforementioned taken together are felt to place the patient at high risk for further clinical deterioration. However, it is anticipated that the patient may be medically stable for discharge from the hospital within 24 to 48 hours.  Editha Ram MD [facility]  If 7PM-7AM, please contact night-coverage [email or web address]  08/25/2023, 4:10 AM

## 2023-08-25 NOTE — Care Management Obs Status (Signed)
 MEDICARE OBSERVATION STATUS NOTIFICATION   Patient Details  Name: ADYLEE LEONARDO MRN: [id number] Date of Birth: 09-11-1950   Medicare Observation Status Notification Given:  Yes    Andrick Rust, RN 08/25/2023, 6:55 PM

## 2023-08-25 NOTE — Progress Notes (Signed)
 OT Cancellation Note  Patient Details Name: Rachel Herman MRN: [id number] DOB: 05/19/1950   Cancelled Treatment:    Reason Eval/Treat Not Completed: Patient not medically ready (Awaiting ortho consult for formal OT eval in setting of superior/inferior pubic Rami fx.)  Rachel Herman, OTD, OTR/L [facility] Office: [phone]   Rachel Herman 08/25/2023, 7:40 AM

## 2023-08-25 NOTE — TOC CM/SW Note (Signed)
 SW met patient at bedside to discuss SNF bed offers. Patient selected [facility] for SNF.   Patient is not medically ready at this time, she is set to be admitted inpatient. SW will completed auth once patient is close to being medically ready.    .Kayline Sheer, MSW, LCSWA Transition of Care  Clinical Social Worker (ED 3-11 Mon-Fri)  [phone]

## 2023-08-25 NOTE — Progress Notes (Signed)
  Echocardiogram 2D Echocardiogram has been performed.  Rachel Herman 08/25/2023, 10:43 AM

## 2023-08-25 NOTE — TOC CAGE-AID Note (Signed)
 Transition of Care [facility]) - CAGE-AID Screening  Patient Details  Name: Rachel Herman MRN: [id number] Date of Birth: 08/10/1950  Clinical Narrative:  Patient denies any current alcohol or drug use, substance abuse resources not needed at this time.  CAGE-AID Screening:   Have You Ever Felt You Ought to Cut Down on Your Drinking or Drug Use?: No Have People Annoyed You By Critizing Your Drinking Or Drug Use?: No Have You Felt Bad Or Guilty About Your Drinking Or Drug Use?: No Have You Ever Had a Drink or Used Drugs First Thing In The Morning to Steady Your Nerves or to Get Rid of a Hangover?: No CAGE-AID Score: 0  Substance Abuse Education Offered: No

## 2023-08-26 ENCOUNTER — Inpatient Hospital Stay (HOSPITAL_COMMUNITY)

## 2023-08-26 ENCOUNTER — Observation Stay (HOSPITAL_BASED_OUTPATIENT_CLINIC_OR_DEPARTMENT_OTHER)

## 2023-08-26 ENCOUNTER — Encounter (HOSPITAL_COMMUNITY): Payer: Self-pay | Admitting: Internal Medicine

## 2023-08-26 DIAGNOSIS — Z7401 Bed confinement status: Secondary | ICD-10-CM | POA: Diagnosis not present

## 2023-08-26 DIAGNOSIS — E041 Nontoxic single thyroid nodule: Secondary | ICD-10-CM | POA: Diagnosis not present

## 2023-08-26 DIAGNOSIS — I129 Hypertensive chronic kidney disease with stage 1 through stage 4 chronic kidney disease, or unspecified chronic kidney disease: Secondary | ICD-10-CM | POA: Diagnosis present

## 2023-08-26 DIAGNOSIS — I10 Essential (primary) hypertension: Secondary | ICD-10-CM | POA: Diagnosis not present

## 2023-08-26 DIAGNOSIS — N1831 Chronic kidney disease, stage 3a: Secondary | ICD-10-CM | POA: Diagnosis not present

## 2023-08-26 DIAGNOSIS — Z7982 Long term (current) use of aspirin: Secondary | ICD-10-CM | POA: Diagnosis not present

## 2023-08-26 DIAGNOSIS — K219 Gastro-esophageal reflux disease without esophagitis: Secondary | ICD-10-CM | POA: Diagnosis not present

## 2023-08-26 DIAGNOSIS — S3289XA Fracture of other parts of pelvis, initial encounter for closed fracture: Secondary | ICD-10-CM | POA: Diagnosis not present

## 2023-08-26 DIAGNOSIS — Z6832 Body mass index (BMI) 32.0-32.9, adult: Secondary | ICD-10-CM | POA: Diagnosis not present

## 2023-08-26 DIAGNOSIS — I3139 Other pericardial effusion (noninflammatory): Secondary | ICD-10-CM | POA: Diagnosis not present

## 2023-08-26 DIAGNOSIS — E66811 Obesity, class 1: Secondary | ICD-10-CM | POA: Diagnosis present

## 2023-08-26 DIAGNOSIS — E119 Type 2 diabetes mellitus without complications: Secondary | ICD-10-CM | POA: Diagnosis not present

## 2023-08-26 DIAGNOSIS — E785 Hyperlipidemia, unspecified: Secondary | ICD-10-CM | POA: Diagnosis not present

## 2023-08-26 DIAGNOSIS — M7989 Other specified soft tissue disorders: Secondary | ICD-10-CM

## 2023-08-26 DIAGNOSIS — E1140 Type 2 diabetes mellitus with diabetic neuropathy, unspecified: Secondary | ICD-10-CM | POA: Diagnosis present

## 2023-08-26 DIAGNOSIS — Z79899 Other long term (current) drug therapy: Secondary | ICD-10-CM | POA: Diagnosis not present

## 2023-08-26 DIAGNOSIS — G8929 Other chronic pain: Secondary | ICD-10-CM | POA: Diagnosis present

## 2023-08-26 DIAGNOSIS — S32511D Fracture of superior rim of right pubis, subsequent encounter for fracture with routine healing: Secondary | ICD-10-CM | POA: Diagnosis not present

## 2023-08-26 DIAGNOSIS — Z743 Need for continuous supervision: Secondary | ICD-10-CM | POA: Diagnosis not present

## 2023-08-26 DIAGNOSIS — S32591A Other specified fracture of right pubis, initial encounter for closed fracture: Secondary | ICD-10-CM | POA: Diagnosis present

## 2023-08-26 DIAGNOSIS — R7989 Other specified abnormal findings of blood chemistry: Secondary | ICD-10-CM | POA: Diagnosis not present

## 2023-08-26 DIAGNOSIS — W19XXXA Unspecified fall, initial encounter: Secondary | ICD-10-CM | POA: Diagnosis present

## 2023-08-26 DIAGNOSIS — E876 Hypokalemia: Secondary | ICD-10-CM | POA: Diagnosis present

## 2023-08-26 DIAGNOSIS — E1122 Type 2 diabetes mellitus with diabetic chronic kidney disease: Secondary | ICD-10-CM | POA: Diagnosis present

## 2023-08-26 DIAGNOSIS — Z853 Personal history of malignant neoplasm of breast: Secondary | ICD-10-CM | POA: Diagnosis not present

## 2023-08-26 DIAGNOSIS — Z7984 Long term (current) use of oral hypoglycemic drugs: Secondary | ICD-10-CM | POA: Diagnosis not present

## 2023-08-26 DIAGNOSIS — Z833 Family history of diabetes mellitus: Secondary | ICD-10-CM | POA: Diagnosis not present

## 2023-08-26 DIAGNOSIS — R918 Other nonspecific abnormal finding of lung field: Secondary | ICD-10-CM | POA: Diagnosis not present

## 2023-08-26 DIAGNOSIS — R404 Transient alteration of awareness: Secondary | ICD-10-CM | POA: Diagnosis not present

## 2023-08-26 DIAGNOSIS — R55 Syncope and collapse: Secondary | ICD-10-CM | POA: Diagnosis not present

## 2023-08-26 DIAGNOSIS — Z4781 Encounter for orthopedic aftercare following surgical amputation: Secondary | ICD-10-CM | POA: Diagnosis not present

## 2023-08-26 DIAGNOSIS — M6281 Muscle weakness (generalized): Secondary | ICD-10-CM | POA: Diagnosis not present

## 2023-08-26 DIAGNOSIS — I493 Ventricular premature depolarization: Secondary | ICD-10-CM | POA: Diagnosis present

## 2023-08-26 DIAGNOSIS — R519 Headache, unspecified: Secondary | ICD-10-CM | POA: Diagnosis present

## 2023-08-26 DIAGNOSIS — M25551 Pain in right hip: Secondary | ICD-10-CM | POA: Diagnosis not present

## 2023-08-26 DIAGNOSIS — Z8249 Family history of ischemic heart disease and other diseases of the circulatory system: Secondary | ICD-10-CM | POA: Diagnosis not present

## 2023-08-26 DIAGNOSIS — Z923 Personal history of irradiation: Secondary | ICD-10-CM | POA: Diagnosis not present

## 2023-08-26 DIAGNOSIS — R531 Weakness: Secondary | ICD-10-CM | POA: Diagnosis not present

## 2023-08-26 DIAGNOSIS — Z741 Need for assistance with personal care: Secondary | ICD-10-CM | POA: Diagnosis not present

## 2023-08-26 LAB — CBC WITH DIFFERENTIAL/PLATELET
Abs Immature Granulocytes: 0.02 K/uL (ref 0.00–0.07)
Basophils Absolute: 0 K/uL (ref 0.0–0.1)
Basophils Relative: 1 %
Eosinophils Absolute: 0.3 K/uL (ref 0.0–0.5)
Eosinophils Relative: 3 %
HCT: 36.4 % (ref 36.0–46.0)
Hemoglobin: 11.7 g/dL — ABNORMAL LOW (ref 12.0–15.0)
Immature Granulocytes: 0 %
Lymphocytes Relative: 32 %
Lymphs Abs: 2.7 K/uL (ref 0.7–4.0)
MCH: 29.2 pg (ref 26.0–34.0)
MCHC: 32.1 g/dL (ref 30.0–36.0)
MCV: 90.8 fL (ref 80.0–100.0)
Monocytes Absolute: 0.7 K/uL (ref 0.1–1.0)
Monocytes Relative: 8 %
Neutro Abs: 4.7 K/uL (ref 1.7–7.7)
Neutrophils Relative %: 56 %
Platelets: 248 K/uL (ref 150–400)
RBC: 4.01 MIL/uL (ref 3.87–5.11)
RDW: 14 % (ref 11.5–15.5)
WBC: 8.4 K/uL (ref 4.0–10.5)
nRBC: 0 % (ref 0.0–0.2)

## 2023-08-26 LAB — T4, FREE: Free T4: 0.82 ng/dL (ref 0.61–1.12)

## 2023-08-26 LAB — BASIC METABOLIC PANEL WITH GFR
Anion gap: 11 (ref 5–15)
BUN: 15 mg/dL (ref 8–23)
CO2: 28 mmol/L (ref 22–32)
Calcium: 9.2 mg/dL (ref 8.9–10.3)
Chloride: 101 mmol/L (ref 98–111)
Creatinine, Ser: 0.83 mg/dL (ref 0.44–1.00)
GFR, Estimated: >60 mL/min (ref >60)
Glucose, Bld: 99 mg/dL (ref 70–99)
Potassium: 3.4 mmol/L — ABNORMAL LOW (ref 3.5–5.1)
Sodium: 140 mmol/L (ref 135–145)

## 2023-08-26 LAB — GLUCOSE, CAPILLARY
Glucose-Capillary: 99 mg/dL (ref 70–99)
Glucose-Capillary: 185 mg/dL — ABNORMAL HIGH (ref 70–99)
Glucose-Capillary: 145 mg/dL — ABNORMAL HIGH (ref 70–99)
Glucose-Capillary: 120 mg/dL — ABNORMAL HIGH (ref 70–99)

## 2023-08-26 LAB — BRAIN NATRIURETIC PEPTIDE: B Natriuretic Peptide: 86.3 pg/mL (ref 0.0–100.0)

## 2023-08-26 LAB — TSH: TSH: 1.385 u[IU]/mL (ref 0.350–4.500)

## 2023-08-26 LAB — D-DIMER, QUANTITATIVE: D-Dimer, Quant: 0.96 ug{FEU}/mL — ABNORMAL HIGH (ref 0.00–0.50)

## 2023-08-26 LAB — MAGNESIUM: Magnesium: 1.7 mg/dL (ref 1.7–2.4)

## 2023-08-26 MED ORDER — POTASSIUM CHLORIDE CRYS ER 20 MEQ PO TBCR
40.0000 meq | EXTENDED_RELEASE_TABLET | Freq: Once | ORAL | Status: AC
  Administered 2023-08-26: 40 meq via ORAL
  Filled 2023-08-26: qty 2

## 2023-08-26 MED ORDER — IOHEXOL 350 MG/ML SOLN
75.0000 mL | Freq: Once | INTRAVENOUS | Status: AC | PRN
  Administered 2023-08-26: 75 mL via INTRAVENOUS

## 2023-08-26 NOTE — Progress Notes (Signed)
 PT Cancellation Note  Patient Details Name: Rachel Herman MRN: [id number] DOB: [birth year]   Cancelled Treatment:    Reason Eval/Treat Not Completed: (P) Other (comment) (pt being transferred to another unit, then c/o headache pain (severe).) Will continue efforts per PT plan of care as schedule permits.    Connell HERO Breyer Tejera 08/26/2023, 5:35 PM* delayed entry

## 2023-08-26 NOTE — Progress Notes (Signed)
 BLE venous exam completed. [name], RVT

## 2023-08-26 NOTE — Progress Notes (Signed)
 PROGRESS NOTE  KRISY DIX FMW:[id number] DOB: 15-Jun-1950 DOA: 08/24/2023 PCP: Vicci Barnie NOVAK, MD  HPI/Recap of past 24 hours: Shealeigh K Vaughan is a 73 y.o. female with medical history significant of breast cancer in remission, DM2, CKD stage IIIa, GERD, HTN, HLD, obesity, chronic back pain/lumbar radiculopathy presenting with syncope and fall resulting in right-sided hip/pelvic pain.  Patient states she was in her usual state of health, carrying some boxes into her house and the next thing she remembers is waking up on the ground.  Denies preceding dizziness, chest pain. She reports history of syncope in the past. In the ED, vital signs stable. Labs notable for WBC count 11.3, X-ray of right hip/pelvis showing marked severity degenerative changes of the right hip but no fracture or dislocation. CT head/C-spine negative for acute findings. CT abdomen pelvis showing nondisplaced right superior and inferior pubic ramus fractures.  Orthopedics consulted.  Patient admitted for further management.    Today, patient still reporting some headache from fall as well as right hip pain upon movement.  Patient denies any further lightheadedness, chest pain, shortness of breath, abdominal pain, nausea/vomiting, fever/chills.    Assessment/Plan: Principal Problem:   Syncope Active Problems:   Essential hypertension   Hyperlipidemia associated with type 2 diabetes mellitus (HCC)   Stage 3a chronic kidney disease (HCC)   Pelvic fracture (HCC)   Closed fracture of multiple pubic rami (HCC)   Syncope Unknown etiology No prodromal symptoms reported, fell backward hitting head and fracturing right hip Orthostatic vitals pending BNP 86, troponin 6 TSH, free T4 WNL CT head showing no acute findings Troponin negative, EKG showing sinus rhythm and PVCs Echo showed EF of 60 to 65%, no regional wall motion abnormality, grade 1 diastolic dysfunction  EEG noted to be within normal limits   Carotid Doppler unremarkable D-dimer elevated--> 0.96, CTA chest pending BLE vascular Doppler negative for DVT Cardiology notified for heart monitor/ZIO to rule out arrhythmia, recommend placement of Zio upon discharge to SNF.  Please notify cardiology Telemetry, monitor closely  Nondisplaced right superior and inferior pubic ramus fractures In the setting of syncope and fall Orthopedics consulted, nonoperative management, WBAT Continue pain management, PT/OT eval-SNF Follow-up in 2 weeks with outpatient orthopedics  ??  UTI Reported frequency, foul-smelling urine UA/UC pending collection Will not initiate any antibiotics for now until at least UA is resulted  Hypokalemia Replace as needed Magnesium  pending  Hypertension BP stable PTA Coreg , clonidine , hydrochlorothiazide  Restart Coreg , hold clonidine  and HCTZ for now pending BP, may need adjustments of BP meds upon discharge   Type 2 DM with neuropathy A1c 6.0 in February 2025 SSI, Accu-Cheks, hypoglycemic protocol   CKD stage IIIa Creatinine stable   GERD Continue PPI   Hyperlipidemia Continue Lipitor.   Chronic pain/neuropathy Continue gabapentin .   Class I obesity Lifestyle modification advised    Estimated body mass index is 32.36 kg/m as calculated from the following:   Height as of this encounter: 5' 5 (1.651 m).   Weight as of this encounter: 88.2 kg.     Code Status: Full  Family Communication: Fianc at bedside  Disposition Plan: Status is: Observation       Consultants: Orthopedics  Procedures: None  Antimicrobials: None  DVT prophylaxis: Lovenox    Objective: Vitals:   08/25/23 2345 08/26/23 0402 08/26/23 0841 08/26/23 1205  BP:  118/76 127/70 108/62  Pulse:  75 67 86  Resp:  12 16 11   Temp: 98.4 F (36.9 C) 98.6 F (37  C) 98.1 F (36.7 C) 98.2 F (36.8 C)  TempSrc: Oral Oral Oral Oral  SpO2:  96% 95% 96%  Weight:      Height:        Intake/Output Summary (Last  24 hours) at 08/26/2023 1429 Last data filed at 08/26/2023 0844 Gross per 24 hour  Intake --  Output 300 ml  Net -300 ml   Filed Weights   08/25/23 2340  Weight: 88.2 kg    Exam: General: NAD  Cardiovascular: S1, S2 present Respiratory: CTAB Abdomen: Soft, nontender, nondistended, bowel sounds present Musculoskeletal: No bilateral pedal edema noted Skin: Normal Psychiatry: Normal mood     Data Reviewed: CBC: Recent Labs  Lab 08/24/23 1931 08/25/23 0443 08/26/23 0409  WBC 11.3* 10.0 8.4  NEUTROABS 8.0*  --  4.7  HGB 12.6 11.9* 11.7*  HCT 39.7 37.2 36.4  MCV 91.5 91.2 90.8  PLT 265 244 248   Basic Metabolic Panel: Recent Labs  Lab 08/24/23 1931 08/26/23 0409  NA 140 140  K 3.5 3.4*  CL 104 101  CO2 23 28  GLUCOSE 143* 99  BUN 20 15  CREATININE 1.05* 0.83  CALCIUM  9.1 9.2   GFR: Estimated Creatinine Clearance: 66.2 mL/min (by C-G formula based on SCr of 0.83 mg/dL). Liver Function Tests: Recent Labs  Lab 08/24/23 1931  AST 26  ALT 13  ALKPHOS 45  BILITOT 0.9  PROT 7.1  ALBUMIN 3.4*   No results for input(s): LIPASE, AMYLASE in the last 168 hours. No results for input(s): AMMONIA in the last 168 hours. Coagulation Profile: No results for input(s): INR, PROTIME in the last 168 hours. Cardiac Enzymes: No results for input(s): CKTOTAL, CKMB, CKMBINDEX, TROPONINI in the last 168 hours. BNP (last 3 results) No results for input(s): PROBNP in the last 8760 hours. HbA1C: No results for input(s): HGBA1C in the last 72 hours. CBG: Recent Labs  Lab 08/25/23 1222 08/25/23 1729 08/25/23 2120 08/26/23 0622 08/26/23 1208  GLUCAP 112* 143* 135* 99 185*   Lipid Profile: No results for input(s): CHOL, HDL, LDLCALC, TRIG, CHOLHDL, LDLDIRECT in the last 72 hours. Thyroid  Function Tests: Recent Labs    08/26/23 0409  TSH 1.385  FREET4 0.82   Anemia Panel: No results for input(s): VITAMINB12, FOLATE, FERRITIN,  TIBC, IRON, RETICCTPCT in the last 72 hours. Urine analysis:    Component Value Date/Time   COLORURINE YELLOW 02/20/2022 1036   APPEARANCEUR CLEAR 02/20/2022 1036   LABSPEC 1.020 02/20/2022 1036   PHURINE 5.5 02/20/2022 1036   GLUCOSEU NEGATIVE 02/20/2022 1036   HGBUR TRACE (A) 02/20/2022 1036   BILIRUBINUR MODERATE (A) 02/20/2022 1036   BILIRUBINUR negative 07/02/2016 1219   KETONESUR 40 (A) 02/20/2022 1036   PROTEINUR 30 (A) 02/20/2022 1036   UROBILINOGEN 0.2 07/02/2016 1219   UROBILINOGEN 0.2 07/20/2014 1310   NITRITE NEGATIVE 02/20/2022 1036   LEUKOCYTESUR NEGATIVE 02/20/2022 1036   Sepsis Labs: @LABRCNTIP (procalcitonin:4,lacticidven:4)  )No results found for this or any previous visit (from the past 240 hours).    Studies: VAS US  LOWER EXTREMITY VENOUS (DVT) Result Date: 08/26/2023  Lower Venous DVT Study Patient Name:  KEYLANI PERLSTEIN  Date of Exam:   08/26/2023 Medical Rec #: [id number]           Accession #:    [id number] Date of Birth: October 21, 1950          Patient Gender: F Patient Age:   45 years Exam Location:  [facility] Procedure:  VAS US  LOWER EXTREMITY VENOUS (DVT) Referring Phys: Jayvier Burgher --------------------------------------------------------------------------------  Indications: Swelling, and Edema.  Performing Technologist: Elmarie Lindau, RVT  Examination Guidelines: A complete evaluation includes B-mode imaging, spectral Doppler, color Doppler, and power Doppler as needed of all accessible portions of each vessel. Bilateral testing is considered an integral part of a complete examination. Limited examinations for reoccurring indications may be performed as noted. The reflux portion of the exam is performed with the patient in reverse Trendelenburg.  +---------+---------------+---------+-----------+----------+--------------+ RIGHT    CompressibilityPhasicitySpontaneityPropertiesThrombus Aging  +---------+---------------+---------+-----------+----------+--------------+ CFV      Full           Yes      Yes                                 +---------+---------------+---------+-----------+----------+--------------+ SFJ      Full                                                        +---------+---------------+---------+-----------+----------+--------------+ FV Prox  Full                                                        +---------+---------------+---------+-----------+----------+--------------+ FV Mid   Full                                                        +---------+---------------+---------+-----------+----------+--------------+ FV DistalFull                                                        +---------+---------------+---------+-----------+----------+--------------+ PFV      Full                                                        +---------+---------------+---------+-----------+----------+--------------+ POP      Full           Yes      Yes                                 +---------+---------------+---------+-----------+----------+--------------+ PTV      Full                                                        +---------+---------------+---------+-----------+----------+--------------+ PERO     Full                                                        +---------+---------------+---------+-----------+----------+--------------+   +---------+---------------+---------+-----------+----------+--------------+  LEFT     CompressibilityPhasicitySpontaneityPropertiesThrombus Aging +---------+---------------+---------+-----------+----------+--------------+ CFV      Full           Yes      Yes                                 +---------+---------------+---------+-----------+----------+--------------+ SFJ      Full                                                         +---------+---------------+---------+-----------+----------+--------------+ FV Prox  Full                                                        +---------+---------------+---------+-----------+----------+--------------+ FV Mid   Full                                                        +---------+---------------+---------+-----------+----------+--------------+ FV DistalFull                                                        +---------+---------------+---------+-----------+----------+--------------+ PFV      Full                                                        +---------+---------------+---------+-----------+----------+--------------+ POP      Full           Yes      Yes                                 +---------+---------------+---------+-----------+----------+--------------+ PTV      Full                                                        +---------+---------------+---------+-----------+----------+--------------+ PERO     Full                                                        +---------+---------------+---------+-----------+----------+--------------+     Summary: RIGHT: - There is no evidence of deep vein thrombosis in the lower extremity.  - No cystic structure found in the popliteal fossa.  LEFT: - There is no evidence of deep vein thrombosis in the lower extremity.  - No  cystic structure found in the popliteal fossa.  *See table(s) above for measurements and observations.    Preliminary    EEG adult Result Date: 08/25/2023 Shelton Arlin KIDD, MD     08/25/2023  3:49 PM Patient Name: DARTHY MANGANELLI MRN: [id number] Epilepsy Attending: Arlin KIDD Shelton Referring Physician/Provider: Alfornia Madison, MD Date: 08/25/2023 Duration: 22.32 mins Patient history: 72to F with syncope. EEG to evaluate for seizure Level of alertness: Awake, asleep AEDs during EEG study: GBP Technical aspects: This EEG study was done with scalp electrodes positioned  according to the 10-20 International system of electrode placement. Electrical activity was reviewed with band pass filter of 1-70Hz , sensitivity of 7 uV/mm, display speed of 28mm/sec with a 60Hz  notched filter applied as appropriate. EEG data were recorded continuously and digitally stored.  Video monitoring was available and reviewed as appropriate. Description: The posterior dominant rhythm consists of 9-10 Hz activity of moderate voltage (25-35 uV) seen predominantly in posterior head regions, symmetric and reactive to eye opening and eye closing. Sleep was characterized by vertex waves, sleep spindles (12 to 14 Hz), maximal frontocentral region.  Hyperventilation and photic stimulation were not performed.   IMPRESSION: This study is within normal limits. No seizures or epileptiform discharges were seen throughout the recording. A normal interictal EEG does not exclude the diagnosis of epilepsy. Priyanka O Yadav    Scheduled Meds:  atorvastatin   20 mg Oral Daily   carvedilol   12.5 mg Oral BID WC   enoxaparin  (LOVENOX ) injection  40 mg Subcutaneous Q24H   gabapentin   600 mg Oral Daily   gabapentin   900 mg Oral QHS   insulin  aspart  0-5 Units Subcutaneous QHS   insulin  aspart  0-9 Units Subcutaneous TID WC   pantoprazole   40 mg Oral Daily    Continuous Infusions:   LOS: 0 days     Lebron JINNY Cage, MD [facility]  If 7PM-7AM, please contact night-coverage [email or web address] 08/26/2023, 2:29 PM

## 2023-08-27 ENCOUNTER — Inpatient Hospital Stay (HOSPITAL_BASED_OUTPATIENT_CLINIC_OR_DEPARTMENT_OTHER)
Admit: 2023-08-27 | Discharge: 2023-08-27 | Disposition: A | Discharge status: Home / Self Care | Attending: Student | Admitting: Student

## 2023-08-27 DIAGNOSIS — R55 Syncope and collapse: Secondary | ICD-10-CM | POA: Diagnosis not present

## 2023-08-27 LAB — URINALYSIS, ROUTINE W REFLEX MICROSCOPIC
Bilirubin Urine: NEGATIVE
Glucose, UA: NEGATIVE mg/dL
Hgb urine dipstick: NEGATIVE
Ketones, ur: NEGATIVE mg/dL
Leukocytes,Ua: NEGATIVE
Nitrite: NEGATIVE
Protein, ur: NEGATIVE mg/dL
Specific Gravity, Urine: 1.015 (ref 1.005–1.030)
pH: 8 (ref 5.0–8.0)

## 2023-08-27 LAB — BASIC METABOLIC PANEL WITH GFR
Anion gap: 11 (ref 5–15)
BUN: 18 mg/dL (ref 8–23)
CO2: 24 mmol/L (ref 22–32)
Calcium: 9 mg/dL (ref 8.9–10.3)
Chloride: 103 mmol/L (ref 98–111)
Creatinine, Ser: 0.89 mg/dL (ref 0.44–1.00)
GFR, Estimated: >60 mL/min (ref >60)
Glucose, Bld: 118 mg/dL — ABNORMAL HIGH (ref 70–99)
Potassium: 3.7 mmol/L (ref 3.5–5.1)
Sodium: 138 mmol/L (ref 135–145)

## 2023-08-27 LAB — CBC WITH DIFFERENTIAL/PLATELET
Abs Immature Granulocytes: 0.03 K/uL (ref 0.00–0.07)
Basophils Absolute: 0 K/uL (ref 0.0–0.1)
Basophils Relative: 0 %
Eosinophils Absolute: 0.3 K/uL (ref 0.0–0.5)
Eosinophils Relative: 4 %
HCT: 35.4 % — ABNORMAL LOW (ref 36.0–46.0)
Hemoglobin: 11.4 g/dL — ABNORMAL LOW (ref 12.0–15.0)
Immature Granulocytes: 0 %
Lymphocytes Relative: 26 %
Lymphs Abs: 2.4 K/uL (ref 0.7–4.0)
MCH: 29.6 pg (ref 26.0–34.0)
MCHC: 32.2 g/dL (ref 30.0–36.0)
MCV: 91.9 fL (ref 80.0–100.0)
Monocytes Absolute: 0.7 K/uL (ref 0.1–1.0)
Monocytes Relative: 8 %
Neutro Abs: 5.6 K/uL (ref 1.7–7.7)
Neutrophils Relative %: 62 %
Platelets: 255 K/uL (ref 150–400)
RBC: 3.85 MIL/uL — ABNORMAL LOW (ref 3.87–5.11)
RDW: 14.1 % (ref 11.5–15.5)
WBC: 9.1 K/uL (ref 4.0–10.5)
nRBC: 0 % (ref 0.0–0.2)

## 2023-08-27 LAB — GLUCOSE, CAPILLARY
Glucose-Capillary: 257 mg/dL — ABNORMAL HIGH (ref 70–99)
Glucose-Capillary: 96 mg/dL (ref 70–99)
Glucose-Capillary: 131 mg/dL — ABNORMAL HIGH (ref 70–99)
Glucose-Capillary: 120 mg/dL — ABNORMAL HIGH (ref 70–99)
Glucose-Capillary: 151 mg/dL — ABNORMAL HIGH (ref 70–99)

## 2023-08-27 MED ORDER — SENNA 8.6 MG PO TABS
1.0000 | ORAL_TABLET | Freq: Every day | ORAL | Status: DC
  Administered 2023-08-27 – 2023-08-29 (×3): 8.6 mg via ORAL
  Filled 2023-08-27 (×3): qty 1

## 2023-08-27 MED ORDER — ACETAMINOPHEN 500 MG PO TABS
1000.0000 mg | ORAL_TABLET | Freq: Three times a day (TID) | ORAL | Status: DC
  Administered 2023-08-27 – 2023-08-29 (×7): 1000 mg via ORAL
  Filled 2023-08-27 (×8): qty 2

## 2023-08-27 MED ORDER — ACETAMINOPHEN 500 MG PO TABS: 1000.0000 mg | ORAL_TABLET | Freq: Three times a day (TID) | ORAL | 0 refills | Status: AC

## 2023-08-27 MED ORDER — SENNA 8.6 MG PO TABS: 1.0000 | ORAL_TABLET | Freq: Every day | ORAL | 0 refills | Status: DC

## 2023-08-27 MED ORDER — POLYETHYLENE GLYCOL 3350 17 G PO PACK
17.0000 g | PACK | Freq: Two times a day (BID) | ORAL | Status: DC
  Administered 2023-08-27 – 2023-08-28 (×2): 17 g via ORAL
  Filled 2023-08-27 (×5): qty 1

## 2023-08-27 MED ORDER — POLYETHYLENE GLYCOL 3350 17 G PO PACK: 17.0000 g | PACK | Freq: Two times a day (BID) | ORAL | 0 refills | Status: DC

## 2023-08-27 MED ORDER — OXYCODONE HCL 5 MG PO TABS: 5.0000 mg | ORAL_TABLET | Freq: Four times a day (QID) | ORAL | 0 refills | Status: DC | PRN

## 2023-08-27 NOTE — Progress Notes (Signed)
   Ordered live 2 week Zio monitor to be placed prior to discharge for further evaluation of syncope at the request of Internal Medicine. Patient has never been seen by our office so will also arrange New Patient visit in about 1 month to review monitor results.  Jonel Weldon E Jessice Madill, PA-C 08/27/2023 11:10 AM

## 2023-08-27 NOTE — Discharge Summary (Signed)
 Physician Discharge Summary   Patient: Rachel Herman MRN: [id number] DOB: [birth year]  Admit date:     08/24/2023  Discharge date: 08/27/23  Discharge Physician: Owen DELENA Lore   PCP: Vicci Barnie NOVAK, MD   Recommendations at discharge:    Needs to follow up with cardiology for further evaluation of Syncope. She will be discharge with Zio Patch.  Needs to follow up with Ortho in 2 weeks for post evaluation of Nondisplaced right superior and inferior pubic ramus fractures. Needs follow up for thyroid  Nodule and Lung Nodule.     Discharge Diagnoses: Principal Problem:   Syncope Active Problems:   Essential hypertension   Hyperlipidemia associated with type 2 diabetes mellitus (HCC)   Stage 3a chronic kidney disease (HCC)   Pelvic fracture (HCC)   Closed fracture of multiple pubic rami (HCC)  Resolved Problems:   * No resolved hospital problems. *  Hospital Course: 73 year old with past medical history significant for breast cancer in remission, diabetes type 2, CKD stage IIIa, GERD, hypertension, hyperlipidemia, obesity, chronic back pain, lumbar radiculopathy presents after syncope episode resulting in fall and right side hip pelvic pain.  Patient was in her usual state of health, she was carrying some boxes into her house next thing she remembers she was waking up on the ground.  She denies preceding dizziness or chest pain.  She reports a history of syncope in the past.  Labs in the ED white count 11, x-ray of the right hip pelvis showed marked severity degenerative changes of the right hip but no fracture or dislocation.  CT head C-spine negative for acute finding.  CT abdomen and pelvis showed displaced right superior and inferior pubic ramus fractures.  Orthopedic consulted.  Patient was admitted for further management.      Assessment and Plan: 1-Syncope: -Unclear etiology. Orthostatic were not perform.  -CT angio Chest:  Multiple right lung nodules, some new, some  chronic and stable. Largest is 7 mm in the right middle lobe and is stable. No follow-up needed if patient is low-risk -ECHO; normal EF, No wall motion abnormality, No significant Valve abnormalities.  We have been holding clonidine  and HCT, BP has not been significantly elevated.  -She will be discharge with Zio Patch. Cardiology will arrange.  -Doppler negative for DVT -Carotid doppler, unremarkable.  -TSH normal. Troponin 6.   2-Nondisplaced right superior inferior pubic ramus fracture  in setting Syncope and fall.  Orthopodeic recommend non operative management. WBAT.  Plan for rehab.  Pain management, Bowel Management.     Reported frequency, foul-smelling urine   -Check UA>   1.7 cm hypodense nodule in the right lobe of the thyroid  gland. : -Follow up out patient.    Hypokalemia; Replaced.    Hypertension Continue Coreg .  Will continue to hold hydrochlorothiazide  and Clonidine .  Monitor BP, might need to resume clonidine  if BP increases.   Diabetes type 2 with neuropathy  A1c 6.0 in February 2025  Manage with diet.   CKD stage IIIa  renal function stable.   Headaches; post fall. ? Concussion.  CT head negative for intracranial abnormalities.  Schedule tylenol .      GERD Continue PPI   Hyperlipidemia Continue Lipitor.   Chronic pain/neuropathy Continue gabapentin .   Class I obesity Lifestyle modification advised          Consultants: Ortho Procedures performed: none Disposition: Skilled nursing facility Diet recommendation:  Discharge Diet Orders (From admission, onward)     Start  Ordered   08/27/23 0000  Diet - low sodium heart healthy        08/27/23 1055           Carb modified diet DISCHARGE MEDICATION: Allergies as of 08/27/2023   No Known Allergies      Medication List     STOP taking these medications    cloNIDine  0.1 MG tablet Commonly known as: CATAPRES    hydrochlorothiazide  25 MG tablet Commonly known as:  HYDRODIURIL    potassium chloride  10 MEQ tablet Commonly known as: KLOR-CON    traMADol  50 MG tablet Commonly known as: ULTRAM    vitamin C 1000 MG tablet       TAKE these medications    Accu-Chek Guide Test test strip Generic drug: glucose blood Use as instructed to check BS once a day   Accu-Chek Guide w/Device Kit UAD to check BS once a day   Accu-Chek Softclix Lancets lancets Use as instructed   acetaminophen  500 MG tablet Commonly known as: TYLENOL  Take 2 tablets (1,000 mg total) by mouth 3 (three) times daily. What changed: when to take this   albuterol  108 (90 Base) MCG/ACT inhaler Commonly known as: VENTOLIN  HFA Inhale 2 puffs into the lungs every 6 (six) hours as needed for wheezing or shortness of breath.   aspirin EC 325 MG tablet Take 325 mg by mouth daily.   atorvastatin  20 MG tablet Commonly known as: LIPITOR TAKE 1 TABLET BY MOUTH DAILY. STOP PRAVASTATIN    carvedilol  12.5 MG tablet Commonly known as: COREG  Take 1 tablet (12.5 mg total) by mouth 2 (two) times daily with a meal.   gabapentin  300 MG capsule Commonly known as: NEURONTIN  2 tabs PO Q a.m and 3 tabs PO Q p.m What changed:  how much to take how to take this when to take this additional instructions   metFORMIN  500 MG tablet Commonly known as: GLUCOPHAGE  TAKE 1 & 1/2 TABLETS (750 MG) BY MOUTH 2 TIMES DAILY.   multivitamin with minerals tablet Take 1 tablet by mouth daily. Reported on 03/13/2015   omeprazole  20 MG capsule Commonly known as: PRILOSEC Take 1 capsule (20 mg total) by mouth 2 (two) times daily before a meal.   oxyCODONE  5 MG immediate release tablet Commonly known as: Oxy IR/ROXICODONE  Take 1 tablet (5 mg total) by mouth every 6 (six) hours as needed for up to 5 days for moderate pain (pain score 4-6).   polyethylene glycol 17 g packet Commonly known as: MIRALAX  / GLYCOLAX  Take 17 g by mouth 2 (two) times daily.   senna 8.6 MG Tabs tablet Commonly known as:  SENOKOT Take 1 tablet (8.6 mg total) by mouth daily.        Contact information for after-discharge care     Destination     [facility] .   Service: Skilled Nursing Contact information: [address] [place]  [address] [phone]                    Discharge Exam: Rachel Herman   08/25/23 2340  Weight: 88.2 kg   General; NAD  Condition at discharge: stable  The results of significant diagnostics from this hospitalization (including imaging, microbiology, ancillary and laboratory) are listed below for reference.   Imaging Studies: CT Angio Chest Pulmonary Embolism (PE) W or WO Contrast Result Date: 08/26/2023 CLINICAL DATA:  Positive D-dimer and suspected pulmonary embolism. Inpatient encounter. EXAM: CT ANGIOGRAPHY CHEST WITH CONTRAST TECHNIQUE: Multidetector CT imaging of  the chest was performed using the standard protocol during bolus administration of intravenous contrast. Multiplanar CT image reconstructions and MIPs were obtained to evaluate the vascular anatomy. RADIATION DOSE REDUCTION: This exam was performed according to the departmental dose-optimization program which includes automated exposure control, adjustment of the mA and/or kV according to patient size and/or use of iterative reconstruction technique. CONTRAST:  75mL OMNIPAQUE  IOHEXOL  350 MG/ML SOLN COMPARISON:  AP Lat chest 02/19/2022, CTA chest 10/19/2018. FINDINGS: Cardiovascular: The heart is slightly enlarged. There is a small anterior pericardial effusion. No visible coronary calcifications. The pulmonary arteries are normal in caliber. Arterial opacification is diagnostic to the segmental level. No arterial embolism is seen to the segmental level. Subsegmental pulmonary arteries are not well opacified and largely obscured by breathing motion. There is aortic tortuosity, mild scattered calcific plaques in the arch and descending segments. The great  vessels are widely patent. There is no aneurysm, stenosis or dissection. There is a 2 vessel aortic arch with normal variant brachiobicarotid trunk. The pulmonary veins are normal. Mediastinum/Nodes: There is a 1.7 cm hypodense nodule in the right lobe of the thyroid  gland. This has been evaluated previous imaging and with FNA biopsy 05/09/2015. Correlating with biopsy results for possible significance. This appears stable in size and shape since 10/19/2018. There is no intrathoracic or axillary adenopathy. Small hiatal hernia with negative thoracic esophagus, thoracic trachea and main bronchi. Lungs/Pleura: There are a number of right lung rounded nodules, some new, some chronic and unchanged. Largest is a posteromedial right middle lobe nodule again measuring 7 mm on 6:90, stable; There is a right upper lobe nodule laterally measuring 4 mm on 6:33, stable. 5 mm right upper lobe nodule noted laterally measuring 5 mm on 6:53, stable. 3 mm right middle lobe perihilar nodule on 6:61, new; 3 mm right middle lobe nodule laterally on 6:64 year, new.; 4 mm right middle lobe nodule on 6:68, chronic and stable; 3 mm right middle lobe nodule posteriorly on 6:78, chronic and stable; 5 mm right lower lobe nodule on 6:69, chronic and stable; 4 mm right lower lobe nodule on 6:46, chronic and stable; 3 mm right lower lobe nodule on 6:75, new; 3 mm right lower lobe nodule on 6:84, new; 4 mm right lower lobe nodule on 6:87, new. There is biapical pleuroparenchymal reticulated scarring. Mild posterior atelectasis. There is no consolidation, effusion or pneumothorax. Rest of lungs are generally clear. Upper Abdomen: Mild elevation right hemidiaphragm. No acute upper abdominal findings. Musculoskeletal: Spondylosis and bridging enthesopathy thoracic spine. No acute or significant osseous findings. Mild osteopenia. There is a biopsy clip in the central left breast. Adjacent to this is a rim calcified oil cyst measuring 1.6 cm. No  suspicious chest wall findings. Review of the MIP images confirms the above findings. IMPRESSION: 1. No evidence of arterial embolus to the segmental level. Subsegmental pulmonary arteries are not well opacified and largely obscured by breathing motion. 2. Aortic atherosclerosis. 3. Mild cardiomegaly with small anterior pericardial effusion. 4. Multiple right lung nodules, some new, some chronic and stable. Largest is 7 mm in the right middle lobe and is stable. No follow-up needed if patient is low-risk (and has no known or suspected primary neoplasm). Non-contrast chest CT can be considered in 12 months if patient is high-risk. This recommendation follows the consensus statement: Guidelines for Management of Incidental Pulmonary Nodules Detected on CT Images: From the Fleischner Society 2017; Radiology 2017; 284:228-243. 5. Small hiatal hernia. 6. 1.7 cm hypodense nodule in the right  lobe of the thyroid  gland. This has been evaluated previous imaging and with FNA biopsy 05/09/2015. This appears stable in size and shape since 10/19/2018. The cytology result no longer retrievable from Epic. 7. Osteopenia and degenerative change. Aortic Atherosclerosis (ICD10-I70.0). Electronically Signed   By: Francis Quam M.D.   On: 08/26/2023 20:49   VAS US  LOWER EXTREMITY VENOUS (DVT) Result Date: 08/26/2023  Lower Venous DVT Study Patient Name:  CARMALETA YOUNGERS  Date of Exam:   08/26/2023 Medical Rec #: [id number]           Accession #:    [id number] Date of Birth: 1950-08-13          Patient Gender: F Patient Age:   16 years Exam Location:  [facility] Procedure:      VAS US  LOWER EXTREMITY VENOUS (DVT) Referring Phys: NKEIRUKA EZENDUKA --------------------------------------------------------------------------------  Indications: Swelling, and Edema.  Performing Technologist: Elmarie Lindau, RVT  Examination Guidelines: A complete evaluation includes B-mode imaging, spectral Doppler, color Doppler, and power  Doppler as needed of all accessible portions of each vessel. Bilateral testing is considered an integral part of a complete examination. Limited examinations for reoccurring indications may be performed as noted. The reflux portion of the exam is performed with the patient in reverse Trendelenburg.  +---------+---------------+---------+-----------+----------+--------------+ RIGHT    CompressibilityPhasicitySpontaneityPropertiesThrombus Aging +---------+---------------+---------+-----------+----------+--------------+ CFV      Full           Yes      Yes                                 +---------+---------------+---------+-----------+----------+--------------+ SFJ      Full                                                        +---------+---------------+---------+-----------+----------+--------------+ FV Prox  Full                                                        +---------+---------------+---------+-----------+----------+--------------+ FV Mid   Full                                                        +---------+---------------+---------+-----------+----------+--------------+ FV DistalFull                                                        +---------+---------------+---------+-----------+----------+--------------+ PFV      Full                                                        +---------+---------------+---------+-----------+----------+--------------+ POP      Full  Yes      Yes                                 +---------+---------------+---------+-----------+----------+--------------+ PTV      Full                                                        +---------+---------------+---------+-----------+----------+--------------+ PERO     Full                                                        +---------+---------------+---------+-----------+----------+--------------+    +---------+---------------+---------+-----------+----------+--------------+ LEFT     CompressibilityPhasicitySpontaneityPropertiesThrombus Aging +---------+---------------+---------+-----------+----------+--------------+ CFV      Full           Yes      Yes                                 +---------+---------------+---------+-----------+----------+--------------+ SFJ      Full                                                        +---------+---------------+---------+-----------+----------+--------------+ FV Prox  Full                                                        +---------+---------------+---------+-----------+----------+--------------+ FV Mid   Full                                                        +---------+---------------+---------+-----------+----------+--------------+ FV DistalFull                                                        +---------+---------------+---------+-----------+----------+--------------+ PFV      Full                                                        +---------+---------------+---------+-----------+----------+--------------+ POP      Full           Yes      Yes                                 +---------+---------------+---------+-----------+----------+--------------+ PTV  Full                                                        +---------+---------------+---------+-----------+----------+--------------+ PERO     Full                                                        +---------+---------------+---------+-----------+----------+--------------+     Summary: RIGHT: - There is no evidence of deep vein thrombosis in the lower extremity.  - No cystic structure found in the popliteal fossa.  LEFT: - There is no evidence of deep vein thrombosis in the lower extremity.  - No cystic structure found in the popliteal fossa.  *See table(s) above for measurements and observations. Electronically signed  by Norman Serve on 08/26/2023 at 3:24:37 PM.    Final    VAS US  CAROTID Result Date: 08/25/2023 Carotid Arterial Duplex Study Patient Name:  DACIE MANDEL  Date of Exam:   08/25/2023 Medical Rec #: [id number]           Accession #:    [id number] Date of Birth: 1950-12-31          Patient Gender: F Patient Age:   32 years Exam Location:  [facility] Procedure:      VAS US  CAROTID Referring Phys: EDITHA RATHORE --------------------------------------------------------------------------------  Indications:       CVA. Risk Factors:      Hypertension, hyperlipidemia, Diabetes, no history of                    smoking. Other Factors:     CKD. Limitations        Today's exam was limited due to the body habitus of the                    patient and poor ultrasound/tissue interface. Comparison Study:  No previous exams on file Performing Technologist: Jody Hill RVT, RDMS  Examination Guidelines: A complete evaluation includes B-mode imaging, spectral Doppler, color Doppler, and power Doppler as needed of all accessible portions of each vessel. Bilateral testing is considered an integral part of a complete examination. Limited examinations for reoccurring indications may be performed as noted.  Right Carotid Findings: +----------+--------+--------+--------+------------------+--------+           PSV cm/sEDV cm/sStenosisPlaque DescriptionComments +----------+--------+--------+--------+------------------+--------+ CCA Prox  70      22                                         +----------+--------+--------+--------+------------------+--------+ CCA Distal71      22                                         +----------+--------+--------+--------+------------------+--------+ ICA Prox  63      21      1-39%   calcific                   +----------+--------+--------+--------+------------------+--------+ ICA Distal61  25                                          +----------+--------+--------+--------+------------------+--------+ ECA       91      16                                         +----------+--------+--------+--------+------------------+--------+ +----------+--------+-------+----------------+-------------------+           PSV cm/sEDV cmsDescribe        Arm Pressure (mmHG) +----------+--------+-------+----------------+-------------------+ [id number]             Multiphasic, WNL                    +----------+--------+-------+----------------+-------------------+ +---------+--------+--+--------+--+---------+ VertebralPSV cm/s37EDV cm/s13Antegrade +---------+--------+--+--------+--+---------+  Left Carotid Findings: +----------+--------+--------+--------+------------------+--------+           PSV cm/sEDV cm/sStenosisPlaque DescriptionComments +----------+--------+--------+--------+------------------+--------+ CCA Prox  79      25                                         +----------+--------+--------+--------+------------------+--------+ CCA Distal70      21                                         +----------+--------+--------+--------+------------------+--------+ ICA Prox  68      23              focal and calcific         +----------+--------+--------+--------+------------------+--------+ ICA Distal83      35                                         +----------+--------+--------+--------+------------------+--------+ ECA       75      14                                         +----------+--------+--------+--------+------------------+--------+ +----------+--------+--------+----------------+-------------------+           PSV cm/sEDV cm/sDescribe        Arm Pressure (mmHG) +----------+--------+--------+----------------+-------------------+ Subclavian117             Multiphasic, WNL                    +----------+--------+--------+----------------+-------------------+  +---------+--------+--+--------+--+---------+ VertebralPSV cm/s47EDV cm/s20Antegrade +---------+--------+--+--------+--+---------+   Summary: Right Carotid: Velocities in the right ICA are consistent with a 1-39% stenosis. Left Carotid: The extracranial vessels were near-normal with only minimal wall               thickening or plaque. Vertebrals:  Bilateral vertebral arteries demonstrate antegrade flow. Subclavians: Normal flow hemodynamics were seen in bilateral subclavian              arteries. *See table(s) above for measurements and observations.  Electronically signed by Eather Popp MD on 08/25/2023 at 5:23:06 PM.    Final    EEG adult Result Date: 08/25/2023 Shelton Arlin KIDD, MD  08/25/2023  3:49 PM Patient Name: Laelah K Zendejas MRN: [id number] Epilepsy Attending: Arlin MALVA Krebs Referring Physician/Provider: Alfornia Madison, MD Date: 08/25/2023 Duration: 22.32 mins Patient history: 72to F with syncope. EEG to evaluate for seizure Level of alertness: Awake, asleep AEDs during EEG study: GBP Technical aspects: This EEG study was done with scalp electrodes positioned according to the 10-20 International system of electrode placement. Electrical activity was reviewed with band pass filter of 1-70Hz , sensitivity of 7 uV/mm, display speed of 49mm/sec with a 60Hz  notched filter applied as appropriate. EEG data were recorded continuously and digitally stored.  Video monitoring was available and reviewed as appropriate. Description: The posterior dominant rhythm consists of 9-10 Hz activity of moderate voltage (25-35 uV) seen predominantly in posterior head regions, symmetric and reactive to eye opening and eye closing. Sleep was characterized by vertex waves, sleep spindles (12 to 14 Hz), maximal frontocentral region.  Hyperventilation and photic stimulation were not performed.   IMPRESSION: This study is within normal limits. No seizures or epileptiform discharges were seen throughout the  recording. A normal interictal EEG does not exclude the diagnosis of epilepsy. Arlin MALVA Krebs   ECHOCARDIOGRAM COMPLETE Result Date: 08/25/2023    ECHOCARDIOGRAM REPORT   Patient Name:   TERREA BRUSTER Date of Exam: 08/25/2023 Medical Rec #:  [id number]          Height:       65.0 in Accession #:    [id number]         Weight:       187.0 lb Date of Birth:  07-31-1950         BSA:          1.922 m Patient Age:    72 years           BP:           128/80 mmHg Patient Gender: F                  HR:           85 bpm. Exam Location:  Inpatient Procedure: 2D Echo (Both Spectral and Color Flow Doppler were utilized during            procedure). Indications:    syncope  History:        Patient has no prior history of Echocardiogram examinations.                 Chronic kidney disease; Risk Factors:Hypertension, Dyslipidemia                 and Diabetes.  Sonographer:    Tinnie Barefoot RDCS Referring Phys: [id number] VASUNDHRA RATHORE IMPRESSIONS  1. Left ventricular ejection fraction, by estimation, is 60 to 65%. The left ventricle has normal function. The left ventricle has no regional wall motion abnormalities. Left ventricular diastolic parameters are consistent with Grade I diastolic dysfunction (impaired relaxation).  2. Right ventricular systolic function is normal. The right ventricular size is normal.  3. The mitral valve is normal in structure. No evidence of mitral valve regurgitation. No evidence of mitral stenosis.  4. The aortic valve is normal in structure. There is mild calcification of the aortic valve. Aortic valve regurgitation is not visualized. Aortic valve sclerosis/calcification is present, without any evidence of aortic stenosis.  5. The inferior vena cava is normal in size with greater than 50% respiratory variability, suggesting right atrial pressure of 3 mmHg. FINDINGS  Left Ventricle: Left ventricular ejection fraction, by  estimation, is 60 to 65%. The left ventricle has normal function.  The left ventricle has no regional wall motion abnormalities. The left ventricular internal cavity size was normal in size. There is  no left ventricular hypertrophy. Left ventricular diastolic parameters are consistent with Grade I diastolic dysfunction (impaired relaxation). Indeterminate filling pressures. Right Ventricle: The right ventricular size is normal. No increase in right ventricular wall thickness. Right ventricular systolic function is normal. Left Atrium: Left atrial size was normal in size. Right Atrium: Right atrial size was normal in size. Pericardium: There is no evidence of pericardial effusion. Mitral Valve: The mitral valve is normal in structure. No evidence of mitral valve regurgitation. No evidence of mitral valve stenosis. Tricuspid Valve: The tricuspid valve is normal in structure. Tricuspid valve regurgitation is not demonstrated. No evidence of tricuspid stenosis. Aortic Valve: The aortic valve is normal in structure. There is mild calcification of the aortic valve. Aortic valve regurgitation is not visualized. Aortic valve sclerosis/calcification is present, without any evidence of aortic stenosis. Pulmonic Valve: The pulmonic valve was normal in structure. Pulmonic valve regurgitation is not visualized. No evidence of pulmonic stenosis. Aorta: The aortic root is normal in size and structure. Venous: The inferior vena cava is normal in size with greater than 50% respiratory variability, suggesting right atrial pressure of 3 mmHg. IAS/Shunts: No atrial level shunt detected by color flow Doppler.  LEFT VENTRICLE PLAX 2D LVIDd:         4.50 cm   Diastology LVIDs:         3.10 cm   LV e' medial:    5.66 cm/s LV PW:         0.80 cm   LV E/e' medial:  12.9 LV IVS:        1.00 cm   LV e' lateral:   6.74 cm/s LVOT diam:     2.00 cm   LV E/e' lateral: 10.8 LV SV:         63 LV SV Index:   33 LVOT Area:     3.14 cm  RIGHT VENTRICLE             IVC RV Basal diam:  2.70 cm     IVC diam: 1.10 cm RV  S prime:     11.00 cm/s TAPSE (M-mode): 2.4 cm LEFT ATRIUM             Index        RIGHT ATRIUM           Index LA diam:        3.20 cm 1.66 cm/m   RA Area:     14.20 cm LA Vol (A2C):   43.5 ml 22.63 ml/m  RA Volume:   35.80 ml  18.62 ml/m LA Vol (A4C):   56.0 ml 29.13 ml/m LA Biplane Vol: 50.5 ml 26.27 ml/m  AORTIC VALVE LVOT Vmax:   96.10 cm/s LVOT Vmean:  64.900 cm/s LVOT VTI:    0.200 m  AORTA Ao Root diam: 3.10 cm Ao Asc diam:  3.00 cm MITRAL VALVE MV Area (PHT): 3.53 cm     SHUNTS MV Decel Time: 215 msec     Systemic VTI:  0.20 m MV E velocity: 73.10 cm/s   Systemic Diam: 2.00 cm MV A velocity: 104.00 cm/s MV E/A ratio:  0.70 Mihai Croitoru MD Electronically signed by Jerel Balding MD Signature Date/Time: 08/25/2023/2:00:21 PM    Final    CT ABDOMEN PELVIS W CONTRAST Result  Date: 08/24/2023 EXAM: CT ABDOMEN AND PELVIS WITH CONTRAST 08/24/2023 11:15:10 PM TECHNIQUE: CT of the abdomen and pelvis was performed with the administration of 75 mL of intravenous iohexol  (OMNIPAQUE ) 350 MG/ML. Multiplanar reformatted images are provided for review. Automated exposure control, iterative reconstruction, and/or weight based adjustment of the mA/kV was utilized to reduce the radiation dose to as low as reasonably achievable. COMPARISON: 02/19/2022 CLINICAL HISTORY: Abdominal trauma, blunt. Pt c/o pain with fall; Trauma. FINDINGS: LOWER CHEST: No acute abnormality. LIVER: The liver is unremarkable. GALLBLADDER AND BILE DUCTS: Layering tiny gallstone (image 28), without associated inflammatory changes. No biliary ductal dilatation. SPLEEN: No acute abnormality. PANCREAS: No acute abnormality. ADRENAL GLANDS: No acute abnormality. KIDNEYS, URETERS AND BLADDER: Subcentimeter left anterior interpolar renal cyst (image 33), benign (Bosniak 1). No follow-up recommended. No stones in the kidneys or ureters. No hydronephrosis. No perinephric or periureteral stranding. Urinary bladder is unremarkable. GI AND BOWEL: Normal  appendix (image 60). Sigmoid diverticulosis, without evidence of diverticulitis. Stomach demonstrates no acute abnormality. There is no bowel obstruction. No bowel wall thickening. PERITONEUM AND RETROPERITONEUM: No ascites. No free air. VASCULATURE: Aorta is normal in caliber. LYMPH NODES: No lymphadenopathy. REPRODUCTIVE ORGANS: Status post hysterectomy. No acute abnormality. BONES AND SOFT TISSUES: Postsurgical changes along the lower anterior abdominal wall. Moderate fat-containing periumbilical hernia (image 50). Degenerative changes of the visualized thoracolumbar spine. Suspected global pelvic floor laxity. Nondisplaced right superior and inferior pubic ramus fractures. Mild degenerative changes of the right hip. IMPRESSION: 1. Nondisplaced right superior and inferior pubic ramus fractures. 2. Additional ancillary findings as above. Electronically signed by: Pinkie Pebbles MD 08/24/2023 11:27 PM EDT RP Workstation: [id number]   CT Head Wo Contrast Result Date: 08/24/2023 EXAM: CT HEAD AND CERVICAL SPINE 08/24/2023 11:15:10 PM TECHNIQUE: CT of the head and cervical spine was performed without the administration of intravenous contrast. Multiplanar reformatted images are provided for review. Automated exposure control, iterative reconstruction, and/or weight based adjustment of the mA/kV was utilized to reduce the radiation dose to as low as reasonably achievable. COMPARISON: 12/02/2021 CLINICAL HISTORY: Head trauma, minor (Age >= 65y). Pt c/o pain with fall; Trauma. FINDINGS: CT HEAD BRAIN AND VENTRICLES: No acute intracranial hemorrhage. No mass effect or midline shift. No abnormal extra-axial fluid collection. Gray-white differentiation is maintained. No hydrocephalus. ORBITS: No acute abnormality. SINUSES AND MASTOIDS: No acute abnormality. SOFT TISSUES AND SKULL: No acute skull fracture. No acute soft tissue abnormality. CT CERVICAL SPINE BONES AND ALIGNMENT: No acute fracture or traumatic  malalignment. DEGENERATIVE CHANGES: Moderate spinal canal stenosis at C4-5 with mild bilateral foraminal stenosis. SOFT TISSUES: No prevertebral soft tissue swelling. IMPRESSION: 1. No acute intracranial abnormality. 2. No acute fracture or traumatic malalignment of the cervical spine. 3. Moderate spinal canal stenosis at C4-5 with mild bilateral foraminal stenosis. Electronically signed by: Franky Stanford MD 08/24/2023 11:20 PM EDT RP Workstation: [id number]   CT Cervical Spine Wo Contrast Result Date: 08/24/2023 EXAM: CT HEAD AND CERVICAL SPINE 08/24/2023 11:15:10 PM TECHNIQUE: CT of the head and cervical spine was performed without the administration of intravenous contrast. Multiplanar reformatted images are provided for review. Automated exposure control, iterative reconstruction, and/or weight based adjustment of the mA/kV was utilized to reduce the radiation dose to as low as reasonably achievable. COMPARISON: 12/02/2021 CLINICAL HISTORY: Head trauma, minor (Age >= 65y). Pt c/o pain with fall; Trauma. FINDINGS: CT HEAD BRAIN AND VENTRICLES: No acute intracranial hemorrhage. No mass effect or midline shift. No abnormal extra-axial fluid collection. Gray-white differentiation is maintained. No hydrocephalus.  ORBITS: No acute abnormality. SINUSES AND MASTOIDS: No acute abnormality. SOFT TISSUES AND SKULL: No acute skull fracture. No acute soft tissue abnormality. CT CERVICAL SPINE BONES AND ALIGNMENT: No acute fracture or traumatic malalignment. DEGENERATIVE CHANGES: Moderate spinal canal stenosis at C4-5 with mild bilateral foraminal stenosis. SOFT TISSUES: No prevertebral soft tissue swelling. IMPRESSION: 1. No acute intracranial abnormality. 2. No acute fracture or traumatic malalignment of the cervical spine. 3. Moderate spinal canal stenosis at C4-5 with mild bilateral foraminal stenosis. Electronically signed by: Franky Stanford MD 08/24/2023 11:20 PM EDT RP Workstation: [id number]   DG Hip Unilat W or Wo  Pelvis 2-3 Views Right Result Date: 08/24/2023 CLINICAL DATA:  Status post fall. EXAM: DG HIP (WITH OR WITHOUT PELVIS) 2-3V RIGHT COMPARISON:  None Available. FINDINGS: There is no evidence of an acute hip fracture or dislocation. Marked severity degenerative changes are seen in the of joint space narrowing, acetabular sclerosis, lateral acetabular bony spurring and subchondral cyst formation. IMPRESSION: Marked severity degenerative changes of the right hip. Electronically Signed   By: Suzen Dials M.D.   On: 08/24/2023 19:48    Microbiology: Results for orders placed or performed during the hospital encounter of 02/19/22  Resp panel by RT-PCR (RSV, Flu A&B, Covid) Anterior Nasal Swab     Status: Abnormal   Collection Time: 02/19/22  8:12 PM   Specimen: Anterior Nasal Swab  Result Value Ref Range Status   SARS Coronavirus 2 by RT PCR NEGATIVE NEGATIVE Final    Comment: (NOTE) SARS-CoV-2 target nucleic acids are NOT DETECTED.  The SARS-CoV-2 RNA is generally detectable in upper respiratory specimens during the acute phase of infection. The lowest concentration of SARS-CoV-2 viral copies this assay can detect is 138 copies/mL. A negative result does not preclude SARS-Cov-2 infection and should not be used as the sole basis for treatment or other patient management decisions. A negative result may occur with  improper specimen collection/handling, submission of specimen other than nasopharyngeal swab, presence of viral mutation(s) within the areas targeted by this assay, and inadequate number of viral copies(<138 copies/mL). A negative result must be combined with clinical observations, patient history, and epidemiological information. The expected result is Negative.  Fact Sheet for Patients:  BloggerCourse.com  Fact Sheet for Healthcare Providers:  SeriousBroker.it  This test is no t yet approved or cleared by the [place]   FDA and  has been authorized for detection and/or diagnosis of SARS-CoV-2 by FDA under an Emergency Use Authorization (EUA). This EUA will remain  in effect (meaning this test can be used) for the duration of the COVID-19 declaration under Section 564(b)(1) of the Act, 21 U.S.C.section 360bbb-3(b)(1), unless the authorization is terminated  or revoked sooner.       Influenza A by PCR POSITIVE (A) NEGATIVE Final   Influenza B by PCR NEGATIVE NEGATIVE Final    Comment: (NOTE) The Xpert Xpress SARS-CoV-2/FLU/RSV plus assay is intended as an aid in the diagnosis of influenza from Nasopharyngeal swab specimens and should not be used as a sole basis for treatment. Nasal washings and aspirates are unacceptable for Xpert Xpress SARS-CoV-2/FLU/RSV testing.  Fact Sheet for Patients: BloggerCourse.com  Fact Sheet for Healthcare Providers: SeriousBroker.it  This test is not yet approved or cleared by the [place]  FDA and has been authorized for detection and/or diagnosis of SARS-CoV-2 by FDA under an Emergency Use Authorization (EUA). This EUA will remain in effect (meaning this test can be used) for the duration of the COVID-19 declaration under Section 564(b)(1)  of the Act, 21 U.S.C. section 360bbb-3(b)(1), unless the authorization is terminated or revoked.     Resp Syncytial Virus by PCR NEGATIVE NEGATIVE Final    Comment: (NOTE) Fact Sheet for Patients: BloggerCourse.com  Fact Sheet for Healthcare Providers: SeriousBroker.it  This test is not yet approved or cleared by the [place]  FDA and has been authorized for detection and/or diagnosis of SARS-CoV-2 by FDA under an Emergency Use Authorization (EUA). This EUA will remain in effect (meaning this test can be used) for the duration of the COVID-19 declaration under Section 564(b)(1) of the Act, 21 U.S.C. section  360bbb-3(b)(1), unless the authorization is terminated or revoked.  Performed at [facility], [address], [place], [place] [address]     Labs: CBC: Recent Labs  Lab 08/24/23 1931 08/25/23 0443 08/26/23 0409 08/27/23 0541  WBC 11.3* 10.0 8.4 9.1  NEUTROABS 8.0*  --  4.7 5.6  HGB 12.6 11.9* 11.7* 11.4*  HCT 39.7 37.2 36.4 35.4*  MCV 91.5 91.2 90.8 91.9  PLT 265 244 248 255   Basic Metabolic Panel: Recent Labs  Lab 08/24/23 1931 08/26/23 0409 08/27/23 0541  NA 140 140 138  K 3.5 3.4* 3.7  CL 104 101 103  CO2 23 28 24   GLUCOSE 143* 99 118*  BUN 20 15 18   CREATININE 1.05* 0.83 0.89  CALCIUM  9.1 9.2 9.0  MG  --  1.7  --    Liver Function Tests: Recent Labs  Lab 08/24/23 1931  AST 26  ALT 13  ALKPHOS 45  BILITOT 0.9  PROT 7.1  ALBUMIN 3.4*   CBG: Recent Labs  Lab 08/26/23 1208 08/26/23 1654 08/26/23 2102 08/26/23 2115 08/27/23 0808  GLUCAP 185* 145* 120* 257* 96    Discharge time spent: greater than 30 minutes.  Signed: Owen DELENA Lore, MD [facility] 08/27/2023

## 2023-08-27 NOTE — Plan of Care (Signed)
  Problem: Nutritional: Goal: Maintenance of adequate nutrition will improve Outcome: Progressing   Problem: Clinical Measurements: Goal: Diagnostic test results will improve Outcome: Progressing Goal: Respiratory complications will improve Outcome: Progressing   Problem: Elimination: Goal: Will not experience complications related to urinary retention Outcome: Progressing

## 2023-08-27 NOTE — Progress Notes (Addendum)
 Physical Therapy Treatment Patient Details Name: Rachel Herman MRN: [id number] DOB: November 27, 1950 Today's Date: 08/27/2023   History of Present Illness Pt is a 73 y/o F admitted on 08/24/23 after presenting with c/o syncope & fall resulting in R sided hip/pelvic pain. CT showed nondisplaced right superior & inferior pubic ramus fractures. Ortho was consulted & recommending non operative management & WBAT. PMH: breast CA in remission, DM2, CKD 3A, GERD, HTN, HLD, obesity, chronic back pain/lumbar radiculopathy    PT Comments  Pt seen for PT tx with pt agreeable. Pt requires min assist for supine>sit with hospital bed features, CGA for sit>stand with cuing re: hand placement, & CGA for gait. Pt is able to tolerate more gait on this date despite c/o ongoing pelvic pain. PT encouraged continued mobility. Will continue to follow pt acutely to progress mobility as able.    If plan is discharge home, recommend the following: A little help with walking and/or transfers;A little help with bathing/dressing/bathroom;Assistance with cooking/housework;Help with stairs or ramp for entrance;Assist for transportation   Can travel by private vehicle     Yes  Equipment Recommendations  Rolling walker (2 wheels);BSC/3in1    Recommendations for Other Services Rehab consult     Precautions / Restrictions Precautions Precautions: Fall Restrictions Weight Bearing Restrictions Per Provider Order: Yes RLE Weight Bearing Per Provider Order: Weight bearing as tolerated LLE Weight Bearing Per Provider Order: Weight bearing as tolerated     Mobility  Bed Mobility Overal bed mobility: Needs Assistance Bed Mobility: Supine to Sit     Supine to sit: Min assist, HOB elevated, Used rails (assistance to upright trunk, exits L side of bed, uses bed rails, HOB elevated, cuing for technique)          Transfers Overall transfer level: Needs assistance Equipment used: Rolling walker (2 wheels) Transfers: Sit  to/from Stand Sit to Stand: Contact guard assist           General transfer comment: sit<>stand from EOB, recliner, toilet with grab bar. Cuing re: hand placement. Cuing to turn to square up with walker as pt attempts to push it to side when transferring onto toilet.    Ambulation/Gait Ambulation/Gait assistance: Contact guard assist Gait Distance (Feet):  (20 ft + 5 ft + 10 ft + 30 ft) Assistive device: Rolling walker (2 wheels) Gait Pattern/deviations: Decreased step length - right, Decreased stride length, Decreased step length - left, Decreased dorsiflexion - right Gait velocity: decreased     General Gait Details: decreased heel strike RLE. Pt attempts to stand with R heel off ground, cuing to place foot flat when standing. Cuing re: stepping with feet while turning, as pt attempts to significantly turn walker without stepping with it. Cuing for walker placement when standing at sink.   Stairs             Wheelchair Mobility     Tilt Bed    Modified Rankin (Stroke Patients Only)       Balance Overall balance assessment: Needs assistance Sitting-balance support: Feet supported Sitting balance-Leahy Scale: Good     Standing balance support: During functional activity, No upper extremity supported Standing balance-Leahy Scale: Fair Standing balance comment: standing hand hygiene with CGA                            Communication    Cognition Arousal: Alert Behavior During Therapy: WFL for tasks assessed/performed   PT - Cognitive impairments: No  apparent impairments                         Following commands: Intact      Cueing Cueing Techniques: Verbal cues  Exercises      General Comments General comments (skin integrity, edema, etc.): continent void      Pertinent Vitals/Pain Pain Assessment Pain Assessment: 0-10 Pain Score: 10-Worst pain ever Pain Location: pelvis Pain Descriptors / Indicators: Discomfort,  Grimacing Pain Intervention(s): Premedicated before session, Monitored during session, Limited activity within patient's tolerance    Home Living                          Prior Function            PT Goals (current goals can now be found in the care plan section) Acute Rehab PT Goals Patient Stated Goal: decreased pain, attend son in laws wedding August 9th PT Goal Formulation: With patient Potential to Achieve Goals: Good Progress towards PT goals: Progressing toward goals    Frequency    Min 3X/week      PT Plan      Co-evaluation              AM-PAC PT 6 Clicks Mobility   Outcome Measure  Help needed turning from your back to your side while in a flat bed without using bedrails?: None Help needed moving from lying on your back to sitting on the side of a flat bed without using bedrails?: A Little Help needed moving to and from a bed to a chair (including a wheelchair)?: A Little Help needed standing up from a chair using your arms (e.g., wheelchair or bedside chair)?: A Little Help needed to walk in hospital room?: A Little Help needed climbing 3-5 steps with a railing? : A Lot 6 Click Score: 18    End of Session   Activity Tolerance: Patient tolerated treatment well Patient left: in chair;with chair alarm set;with call bell/phone within reach Nurse Communication: Mobility status (pt's back red/pink, pt urinated in hat in bathroom.) PT Visit Diagnosis: Pain;Other abnormalities of gait and mobility (R26.89);Difficulty in walking, not elsewhere classified (R26.2);Unsteadiness on feet (R26.81);Muscle weakness (generalized) (M62.81) Pain - part of body:  (pelvis)     Time: 8853-8789 PT Time Calculation (min) (ACUTE ONLY): 24 min  Charges:    $Therapeutic Activity: 23-37 mins PT General Charges $$ ACUTE PT VISIT: 1 Visit                     [name], PT, DPT 08/27/23, 12:16 PM    [name] 08/27/2023, 12:16 PM

## 2023-08-28 DIAGNOSIS — R55 Syncope and collapse: Secondary | ICD-10-CM | POA: Diagnosis not present

## 2023-08-28 LAB — URINE CULTURE: Culture: 10000 — AB

## 2023-08-28 LAB — GLUCOSE, CAPILLARY
Glucose-Capillary: 101 mg/dL — ABNORMAL HIGH (ref 70–99)
Glucose-Capillary: 147 mg/dL — ABNORMAL HIGH (ref 70–99)
Glucose-Capillary: 154 mg/dL — ABNORMAL HIGH (ref 70–99)
Glucose-Capillary: 221 mg/dL — ABNORMAL HIGH (ref 70–99)

## 2023-08-28 NOTE — Discharge Summary (Signed)
 Physician Discharge Summary   Patient: Rachel Herman MRN: [id number] DOB: 1950-07-22  Admit date:     08/24/2023  Discharge date: 08/28/23  Discharge Physician: Owen DELENA Lore   PCP: Vicci Barnie NOVAK, MD   Recommendations at discharge:    Needs to follow up with cardiology for further evaluation of Syncope. She will be discharge with Zio Patch.  Needs to follow up with Ortho in 2 weeks for post evaluation of Nondisplaced right superior and inferior pubic ramus fractures. Needs follow up for thyroid  Nodule and Lung Nodule.     Discharge Diagnoses: Principal Problem:   Syncope Active Problems:   Essential hypertension   Hyperlipidemia associated with type 2 diabetes mellitus (HCC)   Stage 3a chronic kidney disease (HCC)   Pelvic fracture (HCC)   Closed fracture of multiple pubic rami (HCC)  Resolved Problems:   * No resolved hospital problems. *  Hospital Course: 73 year old with past medical history significant for breast cancer in remission, diabetes type 2, CKD stage IIIa, GERD, hypertension, hyperlipidemia, obesity, chronic back pain, lumbar radiculopathy presents after syncope episode resulting in fall and right side hip pelvic pain.  Patient was in her usual state of health, she was carrying some boxes into her house next thing she remembers she was waking up on the ground.  She denies preceding dizziness or chest pain.  She reports a history of syncope in the past.  Labs in the ED white count 11, x-ray of the right hip pelvis showed marked severity degenerative changes of the right hip but no fracture or dislocation.  CT head C-spine negative for acute finding.  CT abdomen and pelvis showed displaced right superior and inferior pubic ramus fractures.  Orthopedic consulted.  Patient was admitted for further management.      Assessment and Plan: 1-Syncope: -Unclear etiology. Orthostatic were not perform.  -CT angio Chest:  Multiple right lung nodules, some new, some  chronic and stable. Largest is 7 mm in the right middle lobe and is stable. No follow-up needed if patient is low-risk -ECHO; normal EF, No wall motion abnormality, No significant Valve abnormalities.  We have been holding clonidine  and HCT, BP has not been significantly elevated.  -She will be discharge with Zio Patch. Cardiology will arrange.  -Doppler negative for DVT -Carotid doppler, unremarkable.  -TSH normal. Troponin 6.   2-Nondisplaced right superior inferior pubic ramus fracture  in setting Syncope and fall.  Orthopodeic recommend non operative management. WBAT.  Plan for rehab.  Pain management, Bowel Management.     Reported frequency, foul-smelling urine   -UA> negative  1.7 cm hypodense nodule in the right lobe of the thyroid  gland. : -Follow up out patient.    Hypokalemia; Replaced.    Hypertension Continue Coreg .  Will continue to hold hydrochlorothiazide  and Clonidine .  Monitor BP, might need to resume clonidine  if BP increases.   Diabetes type 2 with neuropathy  A1c 6.0 in February 2025  Manage with diet.   CKD stage IIIa  renal function stable.   Headaches; post fall. ? Concussion.  CT head negative for intracranial abnormalities.  Schedule tylenol .      GERD Continue PPI   Hyperlipidemia Continue Lipitor.   Chronic pain/neuropathy Continue gabapentin .   Class I obesity Lifestyle modification advised    Stable for discharge today.       Consultants: Ortho Procedures performed: none Disposition: Skilled nursing facility Diet recommendation:  Discharge Diet Orders (From admission, onward)     Start  Ordered   08/27/23 0000  Diet - low sodium heart healthy        08/27/23 1055           Carb modified diet DISCHARGE MEDICATION: Allergies as of 08/28/2023   No Known Allergies      Medication List     STOP taking these medications    cloNIDine  0.1 MG tablet Commonly known as: CATAPRES    hydrochlorothiazide  25 MG  tablet Commonly known as: HYDRODIURIL    potassium chloride  10 MEQ tablet Commonly known as: KLOR-CON    traMADol  50 MG tablet Commonly known as: ULTRAM    vitamin C 1000 MG tablet       TAKE these medications    Accu-Chek Guide Test test strip Generic drug: glucose blood Use as instructed to check BS once a day   Accu-Chek Guide w/Device Kit UAD to check BS once a day   Accu-Chek Softclix Lancets lancets Use as instructed   acetaminophen  500 MG tablet Commonly known as: TYLENOL  Take 2 tablets (1,000 mg total) by mouth 3 (three) times daily. What changed: when to take this   albuterol  108 (90 Base) MCG/ACT inhaler Commonly known as: VENTOLIN  HFA Inhale 2 puffs into the lungs every 6 (six) hours as needed for wheezing or shortness of breath.   aspirin EC 325 MG tablet Take 325 mg by mouth daily.   atorvastatin  20 MG tablet Commonly known as: LIPITOR TAKE 1 TABLET BY MOUTH DAILY. STOP PRAVASTATIN    carvedilol  12.5 MG tablet Commonly known as: COREG  Take 1 tablet (12.5 mg total) by mouth 2 (two) times daily with a meal.   gabapentin  300 MG capsule Commonly known as: NEURONTIN  2 tabs PO Q a.m and 3 tabs PO Q p.m What changed:  how much to take how to take this when to take this additional instructions   metFORMIN  500 MG tablet Commonly known as: GLUCOPHAGE  TAKE 1 & 1/2 TABLETS (750 MG) BY MOUTH 2 TIMES DAILY.   multivitamin with minerals tablet Take 1 tablet by mouth daily. Reported on 03/13/2015   omeprazole  20 MG capsule Commonly known as: PRILOSEC Take 1 capsule (20 mg total) by mouth 2 (two) times daily before a meal.   oxyCODONE  5 MG immediate release tablet Commonly known as: Oxy IR/ROXICODONE  Take 1 tablet (5 mg total) by mouth every 6 (six) hours as needed for up to 5 days for moderate pain (pain score 4-6).   polyethylene glycol 17 g packet Commonly known as: MIRALAX  / GLYCOLAX  Take 17 g by mouth 2 (two) times daily.   senna 8.6 MG Tabs  tablet Commonly known as: SENOKOT Take 1 tablet (8.6 mg total) by mouth daily.        Contact information for after-discharge care     Destination     [facility] .   Service: Skilled Nursing Contact information: [address] [place]  [address] [phone]                    Discharge Exam: Fredricka Weights   08/25/23 2340  Weight: 88.2 kg   General; NAD  Condition at discharge: stable  The results of significant diagnostics from this hospitalization (including imaging, microbiology, ancillary and laboratory) are listed below for reference.   Imaging Studies: CT Angio Chest Pulmonary Embolism (PE) W or WO Contrast Result Date: 08/26/2023 CLINICAL DATA:  Positive D-dimer and suspected pulmonary embolism. Inpatient encounter. EXAM: CT ANGIOGRAPHY CHEST WITH CONTRAST TECHNIQUE: Multidetector CT imaging of  the chest was performed using the standard protocol during bolus administration of intravenous contrast. Multiplanar CT image reconstructions and MIPs were obtained to evaluate the vascular anatomy. RADIATION DOSE REDUCTION: This exam was performed according to the departmental dose-optimization program which includes automated exposure control, adjustment of the mA and/or kV according to patient size and/or use of iterative reconstruction technique. CONTRAST:  75mL OMNIPAQUE  IOHEXOL  350 MG/ML SOLN COMPARISON:  AP Lat chest 02/19/2022, CTA chest 10/19/2018. FINDINGS: Cardiovascular: The heart is slightly enlarged. There is a small anterior pericardial effusion. No visible coronary calcifications. The pulmonary arteries are normal in caliber. Arterial opacification is diagnostic to the segmental level. No arterial embolism is seen to the segmental level. Subsegmental pulmonary arteries are not well opacified and largely obscured by breathing motion. There is aortic tortuosity, mild scattered calcific plaques in the arch and  descending segments. The great vessels are widely patent. There is no aneurysm, stenosis or dissection. There is a 2 vessel aortic arch with normal variant brachiobicarotid trunk. The pulmonary veins are normal. Mediastinum/Nodes: There is a 1.7 cm hypodense nodule in the right lobe of the thyroid  gland. This has been evaluated previous imaging and with FNA biopsy 05/09/2015. Correlating with biopsy results for possible significance. This appears stable in size and shape since 10/19/2018. There is no intrathoracic or axillary adenopathy. Small hiatal hernia with negative thoracic esophagus, thoracic trachea and main bronchi. Lungs/Pleura: There are a number of right lung rounded nodules, some new, some chronic and unchanged. Largest is a posteromedial right middle lobe nodule again measuring 7 mm on 6:90, stable; There is a right upper lobe nodule laterally measuring 4 mm on 6:33, stable. 5 mm right upper lobe nodule noted laterally measuring 5 mm on 6:53, stable. 3 mm right middle lobe perihilar nodule on 6:61, new; 3 mm right middle lobe nodule laterally on 6:64 year, new.; 4 mm right middle lobe nodule on 6:68, chronic and stable; 3 mm right middle lobe nodule posteriorly on 6:78, chronic and stable; 5 mm right lower lobe nodule on 6:69, chronic and stable; 4 mm right lower lobe nodule on 6:46, chronic and stable; 3 mm right lower lobe nodule on 6:75, new; 3 mm right lower lobe nodule on 6:84, new; 4 mm right lower lobe nodule on 6:87, new. There is biapical pleuroparenchymal reticulated scarring. Mild posterior atelectasis. There is no consolidation, effusion or pneumothorax. Rest of lungs are generally clear. Upper Abdomen: Mild elevation right hemidiaphragm. No acute upper abdominal findings. Musculoskeletal: Spondylosis and bridging enthesopathy thoracic spine. No acute or significant osseous findings. Mild osteopenia. There is a biopsy clip in the central left breast. Adjacent to this is a rim calcified oil  cyst measuring 1.6 cm. No suspicious chest wall findings. Review of the MIP images confirms the above findings. IMPRESSION: 1. No evidence of arterial embolus to the segmental level. Subsegmental pulmonary arteries are not well opacified and largely obscured by breathing motion. 2. Aortic atherosclerosis. 3. Mild cardiomegaly with small anterior pericardial effusion. 4. Multiple right lung nodules, some new, some chronic and stable. Largest is 7 mm in the right middle lobe and is stable. No follow-up needed if patient is low-risk (and has no known or suspected primary neoplasm). Non-contrast chest CT can be considered in 12 months if patient is high-risk. This recommendation follows the consensus statement: Guidelines for Management of Incidental Pulmonary Nodules Detected on CT Images: From the Fleischner Society 2017; Radiology 2017; 284:228-243. 5. Small hiatal hernia. 6. 1.7 cm hypodense nodule in the right  lobe of the thyroid  gland. This has been evaluated previous imaging and with FNA biopsy 05/09/2015. This appears stable in size and shape since 10/19/2018. The cytology result no longer retrievable from Epic. 7. Osteopenia and degenerative change. Aortic Atherosclerosis (ICD10-I70.0). Electronically Signed   By: Francis Quam M.D.   On: 08/26/2023 20:49   VAS US  LOWER EXTREMITY VENOUS (DVT) Result Date: 08/26/2023  Lower Venous DVT Study Patient Name:  XIADANI DAMMAN  Date of Exam:   08/26/2023 Medical Rec #: [id number]           Accession #:    [id number] Date of Birth: 02/12/1950          Patient Gender: F Patient Age:   29 years Exam Location:  [facility] Procedure:      VAS US  LOWER EXTREMITY VENOUS (DVT) Referring Phys: NKEIRUKA EZENDUKA --------------------------------------------------------------------------------  Indications: Swelling, and Edema.  Performing Technologist: Elmarie Lindau, RVT  Examination Guidelines: A complete evaluation includes B-mode imaging, spectral Doppler, color  Doppler, and power Doppler as needed of all accessible portions of each vessel. Bilateral testing is considered an integral part of a complete examination. Limited examinations for reoccurring indications may be performed as noted. The reflux portion of the exam is performed with the patient in reverse Trendelenburg.  +---------+---------------+---------+-----------+----------+--------------+ RIGHT    CompressibilityPhasicitySpontaneityPropertiesThrombus Aging +---------+---------------+---------+-----------+----------+--------------+ CFV      Full           Yes      Yes                                 +---------+---------------+---------+-----------+----------+--------------+ SFJ      Full                                                        +---------+---------------+---------+-----------+----------+--------------+ FV Prox  Full                                                        +---------+---------------+---------+-----------+----------+--------------+ FV Mid   Full                                                        +---------+---------------+---------+-----------+----------+--------------+ FV DistalFull                                                        +---------+---------------+---------+-----------+----------+--------------+ PFV      Full                                                        +---------+---------------+---------+-----------+----------+--------------+ POP      Full  Yes      Yes                                 +---------+---------------+---------+-----------+----------+--------------+ PTV      Full                                                        +---------+---------------+---------+-----------+----------+--------------+ PERO     Full                                                        +---------+---------------+---------+-----------+----------+--------------+    +---------+---------------+---------+-----------+----------+--------------+ LEFT     CompressibilityPhasicitySpontaneityPropertiesThrombus Aging +---------+---------------+---------+-----------+----------+--------------+ CFV      Full           Yes      Yes                                 +---------+---------------+---------+-----------+----------+--------------+ SFJ      Full                                                        +---------+---------------+---------+-----------+----------+--------------+ FV Prox  Full                                                        +---------+---------------+---------+-----------+----------+--------------+ FV Mid   Full                                                        +---------+---------------+---------+-----------+----------+--------------+ FV DistalFull                                                        +---------+---------------+---------+-----------+----------+--------------+ PFV      Full                                                        +---------+---------------+---------+-----------+----------+--------------+ POP      Full           Yes      Yes                                 +---------+---------------+---------+-----------+----------+--------------+ PTV  Full                                                        +---------+---------------+---------+-----------+----------+--------------+ PERO     Full                                                        +---------+---------------+---------+-----------+----------+--------------+     Summary: RIGHT: - There is no evidence of deep vein thrombosis in the lower extremity.  - No cystic structure found in the popliteal fossa.  LEFT: - There is no evidence of deep vein thrombosis in the lower extremity.  - No cystic structure found in the popliteal fossa.  *See table(s) above for measurements and observations. Electronically signed  by Norman Serve on 08/26/2023 at 3:24:37 PM.    Final    VAS US  CAROTID Result Date: 08/25/2023 Carotid Arterial Duplex Study Patient Name:  SANSA ALKEMA  Date of Exam:   08/25/2023 Medical Rec #: [id number]           Accession #:    [id number] Date of Birth: [birth year]          Patient Gender: F Patient Age:   59 years Exam Location:  [facility] Procedure:      VAS US  CAROTID Referring Phys: EDITHA RATHORE --------------------------------------------------------------------------------  Indications:       CVA. Risk Factors:      Hypertension, hyperlipidemia, Diabetes, no history of                    smoking. Other Factors:     CKD. Limitations        Today's exam was limited due to the body habitus of the                    patient and poor ultrasound/tissue interface. Comparison Study:  No previous exams on file Performing Technologist: Jody Hill RVT, RDMS  Examination Guidelines: A complete evaluation includes B-mode imaging, spectral Doppler, color Doppler, and power Doppler as needed of all accessible portions of each vessel. Bilateral testing is considered an integral part of a complete examination. Limited examinations for reoccurring indications may be performed as noted.  Right Carotid Findings: +----------+--------+--------+--------+------------------+--------+           PSV cm/sEDV cm/sStenosisPlaque DescriptionComments +----------+--------+--------+--------+------------------+--------+ CCA Prox  70      22                                         +----------+--------+--------+--------+------------------+--------+ CCA Distal71      22                                         +----------+--------+--------+--------+------------------+--------+ ICA Prox  63      21      1-39%   calcific                   +----------+--------+--------+--------+------------------+--------+ ICA Distal61  25                                          +----------+--------+--------+--------+------------------+--------+ ECA       91      16                                         +----------+--------+--------+--------+------------------+--------+ +----------+--------+-------+----------------+-------------------+           PSV cm/sEDV cmsDescribe        Arm Pressure (mmHG) +----------+--------+-------+----------------+-------------------+ [id number]             Multiphasic, WNL                    +----------+--------+-------+----------------+-------------------+ +---------+--------+--+--------+--+---------+ VertebralPSV cm/s37EDV cm/s13Antegrade +---------+--------+--+--------+--+---------+  Left Carotid Findings: +----------+--------+--------+--------+------------------+--------+           PSV cm/sEDV cm/sStenosisPlaque DescriptionComments +----------+--------+--------+--------+------------------+--------+ CCA Prox  79      25                                         +----------+--------+--------+--------+------------------+--------+ CCA Distal70      21                                         +----------+--------+--------+--------+------------------+--------+ ICA Prox  68      23              focal and calcific         +----------+--------+--------+--------+------------------+--------+ ICA Distal83      35                                         +----------+--------+--------+--------+------------------+--------+ ECA       75      14                                         +----------+--------+--------+--------+------------------+--------+ +----------+--------+--------+----------------+-------------------+           PSV cm/sEDV cm/sDescribe        Arm Pressure (mmHG) +----------+--------+--------+----------------+-------------------+ Subclavian117             Multiphasic, WNL                    +----------+--------+--------+----------------+-------------------+  +---------+--------+--+--------+--+---------+ VertebralPSV cm/s47EDV cm/s20Antegrade +---------+--------+--+--------+--+---------+   Summary: Right Carotid: Velocities in the right ICA are consistent with a 1-39% stenosis. Left Carotid: The extracranial vessels were near-normal with only minimal wall               thickening or plaque. Vertebrals:  Bilateral vertebral arteries demonstrate antegrade flow. Subclavians: Normal flow hemodynamics were seen in bilateral subclavian              arteries. *See table(s) above for measurements and observations.  Electronically signed by Eather Popp MD on 08/25/2023 at 5:23:06 PM.    Final    EEG adult Result Date: 08/25/2023 Shelton Arlin KIDD, MD  08/25/2023  3:49 PM Patient Name: Donesha K Malmberg MRN: [id number] Epilepsy Attending: Arlin MALVA Krebs Referring Physician/Provider: Alfornia Madison, MD Date: 08/25/2023 Duration: 22.32 mins Patient history: 72to F with syncope. EEG to evaluate for seizure Level of alertness: Awake, asleep AEDs during EEG study: GBP Technical aspects: This EEG study was done with scalp electrodes positioned according to the 10-20 International system of electrode placement. Electrical activity was reviewed with band pass filter of 1-70Hz , sensitivity of 7 uV/mm, display speed of 79mm/sec with a 60Hz  notched filter applied as appropriate. EEG data were recorded continuously and digitally stored.  Video monitoring was available and reviewed as appropriate. Description: The posterior dominant rhythm consists of 9-10 Hz activity of moderate voltage (25-35 uV) seen predominantly in posterior head regions, symmetric and reactive to eye opening and eye closing. Sleep was characterized by vertex waves, sleep spindles (12 to 14 Hz), maximal frontocentral region.  Hyperventilation and photic stimulation were not performed.   IMPRESSION: This study is within normal limits. No seizures or epileptiform discharges were seen throughout the  recording. A normal interictal EEG does not exclude the diagnosis of epilepsy. Arlin MALVA Krebs   ECHOCARDIOGRAM COMPLETE Result Date: 08/25/2023    ECHOCARDIOGRAM REPORT   Patient Name:   YSIDRA SOPHER Date of Exam: 08/25/2023 Medical Rec #:  [id number]          Height:       65.0 in Accession #:    [id number]         Weight:       187.0 lb Date of Birth:  08-06-1950         BSA:          1.922 m Patient Age:    72 years           BP:           128/80 mmHg Patient Gender: F                  HR:           85 bpm. Exam Location:  Inpatient Procedure: 2D Echo (Both Spectral and Color Flow Doppler were utilized during            procedure). Indications:    syncope  History:        Patient has no prior history of Echocardiogram examinations.                 Chronic kidney disease; Risk Factors:Hypertension, Dyslipidemia                 and Diabetes.  Sonographer:    Tinnie Barefoot RDCS Referring Phys: [id number] VASUNDHRA RATHORE IMPRESSIONS  1. Left ventricular ejection fraction, by estimation, is 60 to 65%. The left ventricle has normal function. The left ventricle has no regional wall motion abnormalities. Left ventricular diastolic parameters are consistent with Grade I diastolic dysfunction (impaired relaxation).  2. Right ventricular systolic function is normal. The right ventricular size is normal.  3. The mitral valve is normal in structure. No evidence of mitral valve regurgitation. No evidence of mitral stenosis.  4. The aortic valve is normal in structure. There is mild calcification of the aortic valve. Aortic valve regurgitation is not visualized. Aortic valve sclerosis/calcification is present, without any evidence of aortic stenosis.  5. The inferior vena cava is normal in size with greater than 50% respiratory variability, suggesting right atrial pressure of 3 mmHg. FINDINGS  Left Ventricle: Left ventricular ejection fraction, by  estimation, is 60 to 65%. The left ventricle has normal function.  The left ventricle has no regional wall motion abnormalities. The left ventricular internal cavity size was normal in size. There is  no left ventricular hypertrophy. Left ventricular diastolic parameters are consistent with Grade I diastolic dysfunction (impaired relaxation). Indeterminate filling pressures. Right Ventricle: The right ventricular size is normal. No increase in right ventricular wall thickness. Right ventricular systolic function is normal. Left Atrium: Left atrial size was normal in size. Right Atrium: Right atrial size was normal in size. Pericardium: There is no evidence of pericardial effusion. Mitral Valve: The mitral valve is normal in structure. No evidence of mitral valve regurgitation. No evidence of mitral valve stenosis. Tricuspid Valve: The tricuspid valve is normal in structure. Tricuspid valve regurgitation is not demonstrated. No evidence of tricuspid stenosis. Aortic Valve: The aortic valve is normal in structure. There is mild calcification of the aortic valve. Aortic valve regurgitation is not visualized. Aortic valve sclerosis/calcification is present, without any evidence of aortic stenosis. Pulmonic Valve: The pulmonic valve was normal in structure. Pulmonic valve regurgitation is not visualized. No evidence of pulmonic stenosis. Aorta: The aortic root is normal in size and structure. Venous: The inferior vena cava is normal in size with greater than 50% respiratory variability, suggesting right atrial pressure of 3 mmHg. IAS/Shunts: No atrial level shunt detected by color flow Doppler.  LEFT VENTRICLE PLAX 2D LVIDd:         4.50 cm   Diastology LVIDs:         3.10 cm   LV e' medial:    5.66 cm/s LV PW:         0.80 cm   LV E/e' medial:  12.9 LV IVS:        1.00 cm   LV e' lateral:   6.74 cm/s LVOT diam:     2.00 cm   LV E/e' lateral: 10.8 LV SV:         63 LV SV Index:   33 LVOT Area:     3.14 cm  RIGHT VENTRICLE             IVC RV Basal diam:  2.70 cm     IVC diam: 1.10 cm RV  S prime:     11.00 cm/s TAPSE (M-mode): 2.4 cm LEFT ATRIUM             Index        RIGHT ATRIUM           Index LA diam:        3.20 cm 1.66 cm/m   RA Area:     14.20 cm LA Vol (A2C):   43.5 ml 22.63 ml/m  RA Volume:   35.80 ml  18.62 ml/m LA Vol (A4C):   56.0 ml 29.13 ml/m LA Biplane Vol: 50.5 ml 26.27 ml/m  AORTIC VALVE LVOT Vmax:   96.10 cm/s LVOT Vmean:  64.900 cm/s LVOT VTI:    0.200 m  AORTA Ao Root diam: 3.10 cm Ao Asc diam:  3.00 cm MITRAL VALVE MV Area (PHT): 3.53 cm     SHUNTS MV Decel Time: 215 msec     Systemic VTI:  0.20 m MV E velocity: 73.10 cm/s   Systemic Diam: 2.00 cm MV A velocity: 104.00 cm/s MV E/A ratio:  0.70 Mihai Croitoru MD Electronically signed by Jerel Balding MD Signature Date/Time: 08/25/2023/2:00:21 PM    Final    CT ABDOMEN PELVIS W CONTRAST Result  Date: 08/24/2023 EXAM: CT ABDOMEN AND PELVIS WITH CONTRAST 08/24/2023 11:15:10 PM TECHNIQUE: CT of the abdomen and pelvis was performed with the administration of 75 mL of intravenous iohexol  (OMNIPAQUE ) 350 MG/ML. Multiplanar reformatted images are provided for review. Automated exposure control, iterative reconstruction, and/or weight based adjustment of the mA/kV was utilized to reduce the radiation dose to as low as reasonably achievable. COMPARISON: 02/19/2022 CLINICAL HISTORY: Abdominal trauma, blunt. Pt c/o pain with fall; Trauma. FINDINGS: LOWER CHEST: No acute abnormality. LIVER: The liver is unremarkable. GALLBLADDER AND BILE DUCTS: Layering tiny gallstone (image 28), without associated inflammatory changes. No biliary ductal dilatation. SPLEEN: No acute abnormality. PANCREAS: No acute abnormality. ADRENAL GLANDS: No acute abnormality. KIDNEYS, URETERS AND BLADDER: Subcentimeter left anterior interpolar renal cyst (image 33), benign (Bosniak 1). No follow-up recommended. No stones in the kidneys or ureters. No hydronephrosis. No perinephric or periureteral stranding. Urinary bladder is unremarkable. GI AND BOWEL: Normal  appendix (image 60). Sigmoid diverticulosis, without evidence of diverticulitis. Stomach demonstrates no acute abnormality. There is no bowel obstruction. No bowel wall thickening. PERITONEUM AND RETROPERITONEUM: No ascites. No free air. VASCULATURE: Aorta is normal in caliber. LYMPH NODES: No lymphadenopathy. REPRODUCTIVE ORGANS: Status post hysterectomy. No acute abnormality. BONES AND SOFT TISSUES: Postsurgical changes along the lower anterior abdominal wall. Moderate fat-containing periumbilical hernia (image 50). Degenerative changes of the visualized thoracolumbar spine. Suspected global pelvic floor laxity. Nondisplaced right superior and inferior pubic ramus fractures. Mild degenerative changes of the right hip. IMPRESSION: 1. Nondisplaced right superior and inferior pubic ramus fractures. 2. Additional ancillary findings as above. Electronically signed by: Pinkie Pebbles MD 08/24/2023 11:27 PM EDT RP Workstation: [id number]   CT Head Wo Contrast Result Date: 08/24/2023 EXAM: CT HEAD AND CERVICAL SPINE 08/24/2023 11:15:10 PM TECHNIQUE: CT of the head and cervical spine was performed without the administration of intravenous contrast. Multiplanar reformatted images are provided for review. Automated exposure control, iterative reconstruction, and/or weight based adjustment of the mA/kV was utilized to reduce the radiation dose to as low as reasonably achievable. COMPARISON: 12/02/2021 CLINICAL HISTORY: Head trauma, minor (Age >= 65y). Pt c/o pain with fall; Trauma. FINDINGS: CT HEAD BRAIN AND VENTRICLES: No acute intracranial hemorrhage. No mass effect or midline shift. No abnormal extra-axial fluid collection. Gray-white differentiation is maintained. No hydrocephalus. ORBITS: No acute abnormality. SINUSES AND MASTOIDS: No acute abnormality. SOFT TISSUES AND SKULL: No acute skull fracture. No acute soft tissue abnormality. CT CERVICAL SPINE BONES AND ALIGNMENT: No acute fracture or traumatic  malalignment. DEGENERATIVE CHANGES: Moderate spinal canal stenosis at C4-5 with mild bilateral foraminal stenosis. SOFT TISSUES: No prevertebral soft tissue swelling. IMPRESSION: 1. No acute intracranial abnormality. 2. No acute fracture or traumatic malalignment of the cervical spine. 3. Moderate spinal canal stenosis at C4-5 with mild bilateral foraminal stenosis. Electronically signed by: Franky Stanford MD 08/24/2023 11:20 PM EDT RP Workstation: [id number]   CT Cervical Spine Wo Contrast Result Date: 08/24/2023 EXAM: CT HEAD AND CERVICAL SPINE 08/24/2023 11:15:10 PM TECHNIQUE: CT of the head and cervical spine was performed without the administration of intravenous contrast. Multiplanar reformatted images are provided for review. Automated exposure control, iterative reconstruction, and/or weight based adjustment of the mA/kV was utilized to reduce the radiation dose to as low as reasonably achievable. COMPARISON: 12/02/2021 CLINICAL HISTORY: Head trauma, minor (Age >= 65y). Pt c/o pain with fall; Trauma. FINDINGS: CT HEAD BRAIN AND VENTRICLES: No acute intracranial hemorrhage. No mass effect or midline shift. No abnormal extra-axial fluid collection. Gray-white differentiation is maintained. No hydrocephalus.  ORBITS: No acute abnormality. SINUSES AND MASTOIDS: No acute abnormality. SOFT TISSUES AND SKULL: No acute skull fracture. No acute soft tissue abnormality. CT CERVICAL SPINE BONES AND ALIGNMENT: No acute fracture or traumatic malalignment. DEGENERATIVE CHANGES: Moderate spinal canal stenosis at C4-5 with mild bilateral foraminal stenosis. SOFT TISSUES: No prevertebral soft tissue swelling. IMPRESSION: 1. No acute intracranial abnormality. 2. No acute fracture or traumatic malalignment of the cervical spine. 3. Moderate spinal canal stenosis at C4-5 with mild bilateral foraminal stenosis. Electronically signed by: Franky Stanford MD 08/24/2023 11:20 PM EDT RP Workstation: [id number]   DG Hip Unilat W or Wo  Pelvis 2-3 Views Right Result Date: 08/24/2023 CLINICAL DATA:  Status post fall. EXAM: DG HIP (WITH OR WITHOUT PELVIS) 2-3V RIGHT COMPARISON:  None Available. FINDINGS: There is no evidence of an acute hip fracture or dislocation. Marked severity degenerative changes are seen in the of joint space narrowing, acetabular sclerosis, lateral acetabular bony spurring and subchondral cyst formation. IMPRESSION: Marked severity degenerative changes of the right hip. Electronically Signed   By: Suzen Dials M.D.   On: 08/24/2023 19:48    Microbiology: Results for orders placed or performed during the hospital encounter of 08/24/23  Urine Culture (for pregnant, neutropenic or urologic patients or patients with an indwelling urinary catheter)     Status: Abnormal   Collection Time: 08/25/23  2:51 PM   Specimen: Urine, Clean Catch  Result Value Ref Range Status   Specimen Description URINE, CLEAN CATCH  Final   Special Requests NONE  Final   Culture (A)  Final    10,000 COLONIES/mL STREPTOCOCCUS AGALACTIAE TESTING AGAINST S. AGALACTIAE NOT ROUTINELY PERFORMED DUE TO PREDICTABILITY OF AMP/PEN/VAN SUSCEPTIBILITY. Performed at [facility], [address], [place], [place] [address]    Report Status 08/28/2023 FINAL  Final    Labs: CBC: Recent Labs  Lab 08/24/23 1931 08/25/23 0443 08/26/23 0409 08/27/23 0541  WBC 11.3* 10.0 8.4 9.1  NEUTROABS 8.0*  --  4.7 5.6  HGB 12.6 11.9* 11.7* 11.4*  HCT 39.7 37.2 36.4 35.4*  MCV 91.5 91.2 90.8 91.9  PLT 265 244 248 255   Basic Metabolic Panel: Recent Labs  Lab 08/24/23 1931 08/26/23 0409 08/27/23 0541  NA 140 140 138  K 3.5 3.4* 3.7  CL 104 101 103  CO2 23 28 24   GLUCOSE 143* 99 118*  BUN 20 15 18   CREATININE 1.05* 0.83 0.89  CALCIUM  9.1 9.2 9.0  MG  --  1.7  --    Liver Function Tests: Recent Labs  Lab 08/24/23 1931  AST 26  ALT 13  ALKPHOS 45  BILITOT 0.9  PROT 7.1  ALBUMIN 3.4*   CBG: Recent Labs  Lab  08/27/23 1205 08/27/23 1648 08/27/23 2106 08/28/23 0745 08/28/23 1209  GLUCAP 131* 120* 151* 101* 147*    Discharge time spent: greater than 30 minutes.  Signed: Owen DELENA Lore, MD [facility] 08/28/2023

## 2023-08-28 NOTE — Progress Notes (Signed)
 Physical Therapy Treatment Patient Details Name: Rachel Herman MRN: [id number] DOB: 1950/07/28 Today's Date: 08/28/2023   History of Present Illness Pt is a 73 y/o F admitted on 08/24/23 after presenting with c/o syncope & fall resulting in R sided hip/pelvic pain. CT showed nondisplaced right superior & inferior pubic ramus fractures. Ortho was consulted & recommending non operative management & WBAT. PMH: breast CA in remission, DM2, CKD 3A, GERD, HTN, HLD, obesity, chronic back pain/lumbar radiculopathy    PT Comments  Pt greeted supine in bed, pleasant and agreeable to PT session. She increased gait distance ambulating ~43ft using RW with CGA. Pt demonstrated multiple gait abnormalities with minimal ability to correct. She held her RLE in a guarded position of hip flex, knee flex, and ankle PF. Pt self-limited WBing on RLE secondary to pain. She engaged in BLE exercises to maintain AROM and reduce deconditioning. Pt completed standing exercises using RW with CGA for safety. She demonstrated improved standing tolerance and fair standing balance. Will continue to follow acutely and advance appropriately.     If plan is discharge home, recommend the following: A little help with walking and/or transfers;A little help with bathing/dressing/bathroom;Assistance with cooking/housework;Help with stairs or ramp for entrance;Assist for transportation   Can travel by private vehicle     Yes  Equipment Recommendations  Rolling walker (2 wheels);BSC/3in1    Recommendations for Other Services       Precautions / Restrictions Precautions Precautions: Fall Recall of Precautions/Restrictions: Intact Restrictions Weight Bearing Restrictions Per Provider Order: Yes RLE Weight Bearing Per Provider Order: Weight bearing as tolerated LLE Weight Bearing Per Provider Order: Weight bearing as tolerated     Mobility  Bed Mobility Overal bed mobility: Needs Assistance Bed Mobility: Supine to Sit, Sit  to Supine     Supine to sit: HOB elevated, Used rails, Min assist Sit to supine: HOB elevated, Used rails, Min assist   General bed mobility comments: Pt sat up on L side of bed with increased time. She brought BLE off EOB. Assist to elevate trunk. Pt scooted fwd with BUE support. Returning to bed pt requried assist with bringing BLE back into bed. Repositioned herself by pulling on bedrails and pushing with BLEs.    Transfers Overall transfer level: Needs assistance Equipment used: Rolling walker (2 wheels) Transfers: Sit to/from Stand Sit to Stand: Contact guard assist           General transfer comment: Pt stood from lowest bed height. She demonstrated proper hand placement using RW. Powered up with CGA. Good eccentric control.    Ambulation/Gait Ambulation/Gait assistance: Contact guard assist Gait Distance (Feet): 80 Feet Assistive device: Rolling walker (2 wheels) Gait Pattern/deviations: Step-through pattern, Decreased stride length, Decreased dorsiflexion - right, Knee flexed in stance - right, Decreased weight shift to right Gait velocity: decreased Gait velocity interpretation: <1.8 ft/sec, indicate of risk for recurrent falls   General Gait Details: Pt ambulated with a reciprocal gait pattern. She kept RLE in hip/knee flex, only allowing forefoot to touch ground. Cued pt to allow foot flat contact and attempt heel-to-toe movement. She self-limited WBing on RLE d/t pain. Pt offloaded LEs by increasing WBing through BUE support on RW. Pt maintained good proximity to AD. Navigated room/hallway well. No LOB.   Stairs             Wheelchair Mobility     Tilt Bed    Modified Rankin (Stroke Patients Only)       Balance Overall balance assessment:  Needs assistance Sitting-balance support: Feet supported Sitting balance-Leahy Scale: Good Sitting balance - Comments: Pt sat EOB with supervision.   Standing balance support: Bilateral upper extremity supported,  During functional activity, Reliant on assistive device for balance Standing balance-Leahy Scale: Fair Standing balance comment: Pt dependent on RW and CGA                            Communication Communication Communication: No apparent difficulties  Cognition Arousal: Alert Behavior During Therapy: WFL for tasks assessed/performed   PT - Cognitive impairments: No apparent impairments                         Following commands: Intact      Cueing Cueing Techniques: Verbal cues, Gestural cues  Exercises General Exercises - Lower Extremity Long Arc Quad: Seated, Both, 10 reps, Strengthening (with 5 second hold at top) Hip ABduction/ADduction: Standing, Both, 10 reps, AROM (BUE support on RW and CGA for safety. Bed positioned behind pt.) Hip Flexion/Marching: Seated, Both, 10 reps, AROM (BUE support on RW and CGA for safety. Bed positioned behind pt.) Mini-Sqauts: Standing, Both, 10 reps, AROM (BUE support on RW and CGA for safety. Bed positioned behind pt.)    General Comments        Pertinent Vitals/Pain Pain Assessment Pain Assessment: 0-10 Pain Score: 5  Pain Location: Rt hip/pelvis Pain Descriptors / Indicators: Grimacing, Guarding, Discomfort, Aching Pain Intervention(s): Monitored during session, Limited activity within patient's tolerance, Repositioned, Patient requesting pain meds-RN notified    Home Living                          Prior Function            PT Goals (current goals can now be found in the care plan section) Acute Rehab PT Goals Patient Stated Goal: Get better and have less pain Progress towards PT goals: Progressing toward goals    Frequency    Min 3X/week      PT Plan      Co-evaluation              AM-PAC PT 6 Clicks Mobility   Outcome Measure  Help needed turning from your back to your side while in a flat bed without using bedrails?: A Little Help needed moving from lying on your back to  sitting on the side of a flat bed without using bedrails?: A Little Help needed moving to and from a bed to a chair (including a wheelchair)?: A Little Help needed standing up from a chair using your arms (e.g., wheelchair or bedside chair)?: A Little Help needed to walk in hospital room?: A Little Help needed climbing 3-5 steps with a railing? : A Lot 6 Click Score: 17    End of Session Equipment Utilized During Treatment: Gait belt Activity Tolerance: Patient tolerated treatment well Patient left: in bed;with call bell/phone within reach;with family/visitor present Nurse Communication: Mobility status;Patient requests pain meds PT Visit Diagnosis: Pain;Other abnormalities of gait and mobility (R26.89);Difficulty in walking, not elsewhere classified (R26.2);Unsteadiness on feet (R26.81);Muscle weakness (generalized) (M62.81) Pain - Right/Left: Right Pain - part of body: Hip     Time: 9169-9146 PT Time Calculation (min) (ACUTE ONLY): 23 min  Charges:    $Gait Training: 8-22 mins $Therapeutic Exercise: 8-22 mins PT General Charges $$ ACUTE PT VISIT: 1 Visit  Randall SAUNDERS, PT, DPT Acute Rehabilitation Services Office: [phone] Secure Chat Preferred  Rachel Herman 08/28/2023, 10:13 AM

## 2023-08-28 NOTE — Plan of Care (Signed)
  Problem: Nutritional: Goal: Maintenance of adequate nutrition will improve Outcome: Progressing   Problem: Clinical Measurements: Goal: Ability to maintain clinical measurements within normal limits will improve Outcome: Progressing Goal: Diagnostic test results will improve Outcome: Progressing   Problem: Activity: Goal: Risk for activity intolerance will decrease Outcome: Progressing

## 2023-08-28 NOTE — Plan of Care (Signed)
  Problem: Nutritional: Goal: Maintenance of adequate nutrition will improve Outcome: Progressing   Problem: Clinical Measurements: Goal: Respiratory complications will improve Outcome: Progressing

## 2023-08-28 NOTE — TOC Progression Note (Signed)
 Transition of Care [facility]) - Progression Note    Patient Details  Name: Rachel Herman MRN: [id number] Date of Birth: [birth year]  [facility] CM/SW Contact  Nicholus Chandran LITTIE Moose, LCSW Phone Number: 08/28/2023, 9:41 AM  Clinical Narrative:    CSW requested CMA begin insurance authorization for [place].    Expected Discharge Plan: Skilled Nursing Facility Barriers to Discharge: Continued Medical Work up, English as a second language teacher, SNF Pending bed offer               Expected Discharge Plan and Services       Living arrangements for the past 2 months: Single Family Home Expected Discharge Date: 08/27/23                                     Social Drivers of Health (SDOH) Interventions SDOH Screenings   Food Insecurity: No Food Insecurity (08/26/2023)  Housing: Low Risk  (08/26/2023)  Transportation Needs: No Transportation Needs (08/26/2023)  Utilities: Not At Risk (08/26/2023)  Alcohol Screen: Low Risk  (04/29/2023)  Depression (PHQ2-9): Low Risk  (08/15/2023)  Financial Resource Strain: High Risk (04/29/2023)  Physical Activity: Sufficiently Active (04/29/2023)  Social Connections: Moderately Isolated (08/26/2023)  Stress: Stress Concern Present (04/29/2023)  Tobacco Use: Low Risk  (08/15/2023)  Health Literacy: Adequate Health Literacy (04/29/2023)    Readmission Risk Interventions     No data to display

## 2023-08-28 NOTE — TOC Progression Note (Signed)
 Transition of Care [facility]) - Progression Note    Patient Details  Name: Rachel Herman MRN: [id number] Date of Birth: 02/02/1951  [facility]) CM/SW Contact  Jeoffrey LITTIE Moose, LCSW Phone Number: 08/28/2023, 4:33 PM  Clinical Narrative:    Insurance [employer] for [employer] still pending. Reference #[id number].   Expected Discharge Plan: Skilled Nursing Facility Barriers to Discharge: Continued Medical Work up, English as a second language teacher, SNF Pending bed offer               Expected Discharge Plan and Services       Living arrangements for the past 2 months: Single Family Home Expected Discharge Date: 08/27/23                                     Social Drivers of Health (SDOH) Interventions SDOH Screenings   Food Insecurity: No Food Insecurity (08/26/2023)  Housing: Low Risk  (08/26/2023)  Transportation Needs: No Transportation Needs (08/26/2023)  Utilities: Not At Risk (08/26/2023)  Alcohol Screen: Low Risk  (04/29/2023)  Depression (PHQ2-9): Low Risk  (08/15/2023)  Financial Resource Strain: High Risk (04/29/2023)  Physical Activity: Sufficiently Active (04/29/2023)  Social Connections: Moderately Isolated (08/26/2023)  Stress: Stress Concern Present (04/29/2023)  Tobacco Use: Low Risk  (08/15/2023)  Health Literacy: Adequate Health Literacy (04/29/2023)    Readmission Risk Interventions     No data to display

## 2023-08-29 ENCOUNTER — Encounter (HOSPITAL_COMMUNITY): Payer: Self-pay | Admitting: Internal Medicine

## 2023-08-29 DIAGNOSIS — E0822 Diabetes mellitus due to underlying condition with diabetic chronic kidney disease: Secondary | ICD-10-CM | POA: Diagnosis not present

## 2023-08-29 DIAGNOSIS — S3289XA Fracture of other parts of pelvis, initial encounter for closed fracture: Secondary | ICD-10-CM | POA: Diagnosis not present

## 2023-08-29 DIAGNOSIS — E041 Nontoxic single thyroid nodule: Secondary | ICD-10-CM | POA: Diagnosis not present

## 2023-08-29 DIAGNOSIS — Z741 Need for assistance with personal care: Secondary | ICD-10-CM | POA: Diagnosis not present

## 2023-08-29 DIAGNOSIS — E119 Type 2 diabetes mellitus without complications: Secondary | ICD-10-CM | POA: Diagnosis not present

## 2023-08-29 DIAGNOSIS — M6281 Muscle weakness (generalized): Secondary | ICD-10-CM | POA: Diagnosis not present

## 2023-08-29 DIAGNOSIS — N1831 Chronic kidney disease, stage 3a: Secondary | ICD-10-CM | POA: Diagnosis not present

## 2023-08-29 DIAGNOSIS — K219 Gastro-esophageal reflux disease without esophagitis: Secondary | ICD-10-CM | POA: Diagnosis not present

## 2023-08-29 DIAGNOSIS — E785 Hyperlipidemia, unspecified: Secondary | ICD-10-CM | POA: Diagnosis not present

## 2023-08-29 DIAGNOSIS — R55 Syncope and collapse: Secondary | ICD-10-CM | POA: Diagnosis not present

## 2023-08-29 DIAGNOSIS — Z7401 Bed confinement status: Secondary | ICD-10-CM | POA: Diagnosis not present

## 2023-08-29 DIAGNOSIS — Z743 Need for continuous supervision: Secondary | ICD-10-CM | POA: Diagnosis not present

## 2023-08-29 DIAGNOSIS — S060X0A Concussion without loss of consciousness, initial encounter: Secondary | ICD-10-CM | POA: Diagnosis not present

## 2023-08-29 DIAGNOSIS — Z4781 Encounter for orthopedic aftercare following surgical amputation: Secondary | ICD-10-CM | POA: Diagnosis not present

## 2023-08-29 DIAGNOSIS — I10 Essential (primary) hypertension: Secondary | ICD-10-CM | POA: Diagnosis not present

## 2023-08-29 DIAGNOSIS — E1140 Type 2 diabetes mellitus with diabetic neuropathy, unspecified: Secondary | ICD-10-CM | POA: Diagnosis not present

## 2023-08-29 DIAGNOSIS — R531 Weakness: Secondary | ICD-10-CM | POA: Diagnosis not present

## 2023-08-29 DIAGNOSIS — M1611 Unilateral primary osteoarthritis, right hip: Secondary | ICD-10-CM | POA: Diagnosis not present

## 2023-08-29 DIAGNOSIS — M25551 Pain in right hip: Secondary | ICD-10-CM | POA: Diagnosis not present

## 2023-08-29 DIAGNOSIS — R404 Transient alteration of awareness: Secondary | ICD-10-CM | POA: Diagnosis not present

## 2023-08-29 DIAGNOSIS — S32511D Fracture of superior rim of right pubis, subsequent encounter for fracture with routine healing: Secondary | ICD-10-CM | POA: Diagnosis not present

## 2023-08-29 LAB — GLUCOSE, CAPILLARY
Glucose-Capillary: 138 mg/dL — ABNORMAL HIGH (ref 70–99)
Glucose-Capillary: 174 mg/dL — ABNORMAL HIGH (ref 70–99)

## 2023-08-29 MED ORDER — OXYCODONE HCL 5 MG PO TABS: 5.0000 mg | ORAL_TABLET | Freq: Four times a day (QID) | ORAL | 0 refills | Status: AC | PRN

## 2023-08-29 MED ORDER — SENNA 8.6 MG PO TABS: 1.0000 | ORAL_TABLET | Freq: Every day | ORAL | 0 refills | Status: AC

## 2023-08-29 MED ORDER — POLYETHYLENE GLYCOL 3350 17 G PO PACK: 17.0000 g | PACK | Freq: Two times a day (BID) | ORAL | 0 refills | Status: DC

## 2023-08-29 MED ORDER — GABAPENTIN 300 MG PO CAPS: 900.0000 mg | ORAL_CAPSULE | Freq: Every day | ORAL | 0 refills | Status: AC

## 2023-08-29 MED ORDER — GABAPENTIN 300 MG PO CAPS: 600.0000 mg | ORAL_CAPSULE | Freq: Every day | ORAL | 0 refills | Status: AC

## 2023-08-29 NOTE — Discharge Summary (Signed)
 Physician Discharge Summary   Patient: Rachel Herman MRN: [id number] DOB: 01/26/1951  Admit date:     08/24/2023  Discharge date: 08/29/23  Discharge Physician: Owen DELENA Lore   PCP: Vicci Barnie NOVAK, MD   Recommendations at discharge:    Needs to follow up with cardiology for further evaluation of Syncope. She will be discharge with Zio Patch.  Needs to follow up with Ortho in 2 weeks for post evaluation of Nondisplaced right superior and inferior pubic ramus fractures. Needs follow up for thyroid  Nodule and Lung Nodule.     Discharge Diagnoses: Principal Problem:   Syncope Active Problems:   Essential hypertension   Hyperlipidemia associated with type 2 diabetes mellitus (HCC)   Stage 3a chronic kidney disease (HCC)   Pelvic fracture (HCC)   Closed fracture of multiple pubic rami (HCC)  Resolved Problems:   * No resolved hospital problems. *  Hospital Course: 73 year old with past medical history significant for breast cancer in remission, diabetes type 2, CKD stage IIIa, GERD, hypertension, hyperlipidemia, obesity, chronic back pain, lumbar radiculopathy presents after syncope episode resulting in fall and right side hip pelvic pain.  Patient was in her usual state of health, she was carrying some boxes into her house next thing she remembers she was waking up on the ground.  She denies preceding dizziness or chest pain.  She reports a history of syncope in the past.  Labs in the ED white count 11, x-ray of the right hip pelvis showed marked severity degenerative changes of the right hip but no fracture or dislocation.  CT head C-spine negative for acute finding.  CT abdomen and pelvis showed displaced right superior and inferior pubic ramus fractures.  Orthopedic consulted.  Patient was admitted for further management.  Patient stable for discharge. Authorization available.    Assessment and Plan: 1-Syncope: -Unclear etiology. Orthostatic were not perform.  -CT  angio Chest:  Multiple right lung nodules, some new, some chronic and stable. Largest is 7 mm in the right middle lobe and is stable. No follow-up needed if patient is low-risk -ECHO; normal EF, No wall motion abnormality, No significant Valve abnormalities.  We have been holding clonidine  and HCT, BP has not been significantly elevated.  -She will be discharge with Zio Patch. Cardiology will arrange.  -Doppler negative for DVT -Carotid doppler, unremarkable.  -TSH normal. Troponin 6.   2-Nondisplaced right superior inferior pubic ramus fracture  in setting Syncope and fall.  Orthopodeic recommend non operative management. WBAT.  Plan for rehab.  Pain management, Bowel Management.  Report some hip pain. Continue with pain management.    Reported frequency, foul-smelling urine   -UA> negative  1.7 cm hypodense nodule in the right lobe of the thyroid  gland. : -Follow up out patient.    Hypokalemia; Replaced.    Hypertension Continue Coreg .  Will continue to hold hydrochlorothiazide  and Clonidine .  Monitor BP, might need to resume clonidine  if BP increases.   Diabetes type 2 with neuropathy  A1c 6.0 in February 2025  Manage with diet.   CKD stage IIIa  renal function stable.   Headaches; post fall. ? Concussion.  CT head negative for intracranial abnormalities.  Schedule tylenol .      GERD Continue PPI   Hyperlipidemia Continue Lipitor.   Chronic pain/neuropathy Continue gabapentin .   Class I obesity Lifestyle modification advised    Stable for discharge today.       Consultants: Ortho Procedures performed: none Disposition: Skilled nursing facility Diet recommendation:  Discharge Diet Orders (From admission, onward)     Start     Ordered   08/27/23 0000  Diet - low sodium heart healthy        08/27/23 1055           Carb modified diet DISCHARGE MEDICATION: Allergies as of 08/29/2023   No Known Allergies      Medication List     STOP  taking these medications    cloNIDine  0.1 MG tablet Commonly known as: CATAPRES    hydrochlorothiazide  25 MG tablet Commonly known as: HYDRODIURIL    potassium chloride  10 MEQ tablet Commonly known as: KLOR-CON    traMADol  50 MG tablet Commonly known as: ULTRAM    vitamin C 1000 MG tablet       TAKE these medications    Accu-Chek Guide Test test strip Generic drug: glucose blood Use as instructed to check BS once a day   Accu-Chek Guide w/Device Kit UAD to check BS once a day   Accu-Chek Softclix Lancets lancets Use as instructed   acetaminophen  500 MG tablet Commonly known as: TYLENOL  Take 2 tablets (1,000 mg total) by mouth 3 (three) times daily. What changed: when to take this   albuterol  108 (90 Base) MCG/ACT inhaler Commonly known as: VENTOLIN  HFA Inhale 2 puffs into the lungs every 6 (six) hours as needed for wheezing or shortness of breath.   aspirin EC 325 MG tablet Take 325 mg by mouth daily.   atorvastatin  20 MG tablet Commonly known as: LIPITOR TAKE 1 TABLET BY MOUTH DAILY. STOP PRAVASTATIN    carvedilol  12.5 MG tablet Commonly known as: COREG  Take 1 tablet (12.5 mg total) by mouth 2 (two) times daily with a meal.   gabapentin  300 MG capsule Commonly known as: NEURONTIN  2 tabs PO Q a.m and 3 tabs PO Q p.m What changed:  how much to take how to take this when to take this additional instructions   metFORMIN  500 MG tablet Commonly known as: GLUCOPHAGE  TAKE 1 & 1/2 TABLETS (750 MG) BY MOUTH 2 TIMES DAILY.   multivitamin with minerals tablet Take 1 tablet by mouth daily. Reported on 03/13/2015   omeprazole  20 MG capsule Commonly known as: PRILOSEC Take 1 capsule (20 mg total) by mouth 2 (two) times daily before a meal.   oxyCODONE  5 MG immediate release tablet Commonly known as: Oxy IR/ROXICODONE  Take 1 tablet (5 mg total) by mouth every 6 (six) hours as needed for up to 5 days for moderate pain (pain score 4-6).   polyethylene glycol 17 g  packet Commonly known as: MIRALAX  / GLYCOLAX  Take 17 g by mouth 2 (two) times daily.   senna 8.6 MG Tabs tablet Commonly known as: SENOKOT Take 1 tablet (8.6 mg total) by mouth daily.        Contact information for after-discharge care     Destination     [facility] .   Service: Skilled Nursing Contact information: [address] [place]  [address] [phone]                    Discharge Exam: Fredricka Weights   08/25/23 2340  Weight: 88.2 kg   General; NAD  Condition at discharge: stable  The results of significant diagnostics from this hospitalization (including imaging, microbiology, ancillary and laboratory) are listed below for reference.   Imaging Studies: CT Angio Chest Pulmonary Embolism (PE) W or WO Contrast Result Date: 08/26/2023 CLINICAL DATA:  Positive D-dimer and suspected  pulmonary embolism. Inpatient encounter. EXAM: CT ANGIOGRAPHY CHEST WITH CONTRAST TECHNIQUE: Multidetector CT imaging of the chest was performed using the standard protocol during bolus administration of intravenous contrast. Multiplanar CT image reconstructions and MIPs were obtained to evaluate the vascular anatomy. RADIATION DOSE REDUCTION: This exam was performed according to the departmental dose-optimization program which includes automated exposure control, adjustment of the mA and/or kV according to patient size and/or use of iterative reconstruction technique. CONTRAST:  75mL OMNIPAQUE  IOHEXOL  350 MG/ML SOLN COMPARISON:  AP Lat chest 02/19/2022, CTA chest 10/19/2018. FINDINGS: Cardiovascular: The heart is slightly enlarged. There is a small anterior pericardial effusion. No visible coronary calcifications. The pulmonary arteries are normal in caliber. Arterial opacification is diagnostic to the segmental level. No arterial embolism is seen to the segmental level. Subsegmental pulmonary arteries are not well opacified and largely  obscured by breathing motion. There is aortic tortuosity, mild scattered calcific plaques in the arch and descending segments. The great vessels are widely patent. There is no aneurysm, stenosis or dissection. There is a 2 vessel aortic arch with normal variant brachiobicarotid trunk. The pulmonary veins are normal. Mediastinum/Nodes: There is a 1.7 cm hypodense nodule in the right lobe of the thyroid  gland. This has been evaluated previous imaging and with FNA biopsy 05/09/2015. Correlating with biopsy results for possible significance. This appears stable in size and shape since 10/19/2018. There is no intrathoracic or axillary adenopathy. Small hiatal hernia with negative thoracic esophagus, thoracic trachea and main bronchi. Lungs/Pleura: There are a number of right lung rounded nodules, some new, some chronic and unchanged. Largest is a posteromedial right middle lobe nodule again measuring 7 mm on 6:90, stable; There is a right upper lobe nodule laterally measuring 4 mm on 6:33, stable. 5 mm right upper lobe nodule noted laterally measuring 5 mm on 6:53, stable. 3 mm right middle lobe perihilar nodule on 6:61, new; 3 mm right middle lobe nodule laterally on 6:64 year, new.; 4 mm right middle lobe nodule on 6:68, chronic and stable; 3 mm right middle lobe nodule posteriorly on 6:78, chronic and stable; 5 mm right lower lobe nodule on 6:69, chronic and stable; 4 mm right lower lobe nodule on 6:46, chronic and stable; 3 mm right lower lobe nodule on 6:75, new; 3 mm right lower lobe nodule on 6:84, new; 4 mm right lower lobe nodule on 6:87, new. There is biapical pleuroparenchymal reticulated scarring. Mild posterior atelectasis. There is no consolidation, effusion or pneumothorax. Rest of lungs are generally clear. Upper Abdomen: Mild elevation right hemidiaphragm. No acute upper abdominal findings. Musculoskeletal: Spondylosis and bridging enthesopathy thoracic spine. No acute or significant osseous findings.  Mild osteopenia. There is a biopsy clip in the central left breast. Adjacent to this is a rim calcified oil cyst measuring 1.6 cm. No suspicious chest wall findings. Review of the MIP images confirms the above findings. IMPRESSION: 1. No evidence of arterial embolus to the segmental level. Subsegmental pulmonary arteries are not well opacified and largely obscured by breathing motion. 2. Aortic atherosclerosis. 3. Mild cardiomegaly with small anterior pericardial effusion. 4. Multiple right lung nodules, some new, some chronic and stable. Largest is 7 mm in the right middle lobe and is stable. No follow-up needed if patient is low-risk (and has no known or suspected primary neoplasm). Non-contrast chest CT can be considered in 12 months if patient is high-risk. This recommendation follows the consensus statement: Guidelines for Management of Incidental Pulmonary Nodules Detected on CT Images: From the Fleischner Society 2017;  Radiology 2017; 284:228-243. 5. Small hiatal hernia. 6. 1.7 cm hypodense nodule in the right lobe of the thyroid  gland. This has been evaluated previous imaging and with FNA biopsy 05/09/2015. This appears stable in size and shape since 10/19/2018. The cytology result no longer retrievable from Epic. 7. Osteopenia and degenerative change. Aortic Atherosclerosis (ICD10-I70.0). Electronically Signed   By: Francis Quam M.D.   On: 08/26/2023 20:49   VAS US  LOWER EXTREMITY VENOUS (DVT) Result Date: 08/26/2023  Lower Venous DVT Study Patient Name:  MAKAIYAH SCHWEIGER  Date of Exam:   08/26/2023 Medical Rec #: [id number]           Accession #:    [id number] Date of Birth: 1950/02/18          Patient Gender: F Patient Age:   65 years Exam Location:  [facility] Procedure:      VAS US  LOWER EXTREMITY VENOUS (DVT) Referring Phys: NKEIRUKA EZENDUKA --------------------------------------------------------------------------------  Indications: Swelling, and Edema.  Performing Technologist:  Elmarie Lindau, RVT  Examination Guidelines: A complete evaluation includes B-mode imaging, spectral Doppler, color Doppler, and power Doppler as needed of all accessible portions of each vessel. Bilateral testing is considered an integral part of a complete examination. Limited examinations for reoccurring indications may be performed as noted. The reflux portion of the exam is performed with the patient in reverse Trendelenburg.  +---------+---------------+---------+-----------+----------+--------------+ RIGHT    CompressibilityPhasicitySpontaneityPropertiesThrombus Aging +---------+---------------+---------+-----------+----------+--------------+ CFV      Full           Yes      Yes                                 +---------+---------------+---------+-----------+----------+--------------+ SFJ      Full                                                        +---------+---------------+---------+-----------+----------+--------------+ FV Prox  Full                                                        +---------+---------------+---------+-----------+----------+--------------+ FV Mid   Full                                                        +---------+---------------+---------+-----------+----------+--------------+ FV DistalFull                                                        +---------+---------------+---------+-----------+----------+--------------+ PFV      Full                                                        +---------+---------------+---------+-----------+----------+--------------+  POP      Full           Yes      Yes                                 +---------+---------------+---------+-----------+----------+--------------+ PTV      Full                                                        +---------+---------------+---------+-----------+----------+--------------+ PERO     Full                                                         +---------+---------------+---------+-----------+----------+--------------+   +---------+---------------+---------+-----------+----------+--------------+ LEFT     CompressibilityPhasicitySpontaneityPropertiesThrombus Aging +---------+---------------+---------+-----------+----------+--------------+ CFV      Full           Yes      Yes                                 +---------+---------------+---------+-----------+----------+--------------+ SFJ      Full                                                        +---------+---------------+---------+-----------+----------+--------------+ FV Prox  Full                                                        +---------+---------------+---------+-----------+----------+--------------+ FV Mid   Full                                                        +---------+---------------+---------+-----------+----------+--------------+ FV DistalFull                                                        +---------+---------------+---------+-----------+----------+--------------+ PFV      Full                                                        +---------+---------------+---------+-----------+----------+--------------+ POP      Full           Yes      Yes                                 +---------+---------------+---------+-----------+----------+--------------+  PTV      Full                                                        +---------+---------------+---------+-----------+----------+--------------+ PERO     Full                                                        +---------+---------------+---------+-----------+----------+--------------+     Summary: RIGHT: - There is no evidence of deep vein thrombosis in the lower extremity.  - No cystic structure found in the popliteal fossa.  LEFT: - There is no evidence of deep vein thrombosis in the lower extremity.  - No cystic structure found in the popliteal  fossa.  *See table(s) above for measurements and observations. Electronically signed by Norman Serve on 08/26/2023 at 3:24:37 PM.    Final    VAS US  CAROTID Result Date: 08/25/2023 Carotid Arterial Duplex Study Patient Name:  ANNAMAY LAYMON  Date of Exam:   08/25/2023 Medical Rec #: [id number]           Accession #:    [id number] Date of Birth: 1950/07/01          Patient Gender: F Patient Age:   77 years Exam [facility]:  [facility] Procedure:      VAS US  CAROTID Referring Phys: EDITHA RATHORE --------------------------------------------------------------------------------  Indications:       CVA. Risk Factors:      Hypertension, hyperlipidemia, Diabetes, no history of                    smoking. Other Factors:     CKD. Limitations        Today's exam was limited due to the body habitus of the                    patient and poor ultrasound/tissue interface. Comparison Study:  No previous exams on file Performing Technologist: Jody Hill RVT, RDMS  Examination Guidelines: A complete evaluation includes B-mode imaging, spectral Doppler, color Doppler, and power Doppler as needed of all accessible portions of each vessel. Bilateral testing is considered an integral part of a complete examination. Limited examinations for reoccurring indications may be performed as noted.  Right Carotid Findings: +----------+--------+--------+--------+------------------+--------+           PSV cm/sEDV cm/sStenosisPlaque DescriptionComments +----------+--------+--------+--------+------------------+--------+ CCA Prox  70      22                                         +----------+--------+--------+--------+------------------+--------+ CCA Distal71      22                                         +----------+--------+--------+--------+------------------+--------+ ICA Prox  63      21      1-39%   calcific                   +----------+--------+--------+--------+------------------+--------+  ICA  Distal61      25                                         +----------+--------+--------+--------+------------------+--------+ ECA       91      16                                         +----------+--------+--------+--------+------------------+--------+ +----------+--------+-------+----------------+-------------------+           PSV cm/sEDV cmsDescribe        Arm Pressure (mmHG) +----------+--------+-------+----------------+-------------------+ [id number]             Multiphasic, WNL                    +----------+--------+-------+----------------+-------------------+ +---------+--------+--+--------+--+---------+ VertebralPSV cm/s37EDV cm/s13Antegrade +---------+--------+--+--------+--+---------+  Left Carotid Findings: +----------+--------+--------+--------+------------------+--------+           PSV cm/sEDV cm/sStenosisPlaque DescriptionComments +----------+--------+--------+--------+------------------+--------+ CCA Prox  79      25                                         +----------+--------+--------+--------+------------------+--------+ CCA Distal70      21                                         +----------+--------+--------+--------+------------------+--------+ ICA Prox  68      23              focal and calcific         +----------+--------+--------+--------+------------------+--------+ ICA Distal83      35                                         +----------+--------+--------+--------+------------------+--------+ ECA       75      14                                         +----------+--------+--------+--------+------------------+--------+ +----------+--------+--------+----------------+-------------------+           PSV cm/sEDV cm/sDescribe        Arm Pressure (mmHG) +----------+--------+--------+----------------+-------------------+ Subclavian117             Multiphasic, WNL                     +----------+--------+--------+----------------+-------------------+ +---------+--------+--+--------+--+---------+ VertebralPSV cm/s47EDV cm/s20Antegrade +---------+--------+--+--------+--+---------+   Summary: Right Carotid: Velocities in the right ICA are consistent with a 1-39% stenosis. Left Carotid: The extracranial vessels were near-normal with only minimal wall               thickening or plaque. Vertebrals:  Bilateral vertebral arteries demonstrate antegrade flow. Subclavians: Normal flow hemodynamics were seen in bilateral subclavian              arteries. *See table(s) above for measurements and observations.  Electronically signed by Eather Popp MD on 08/25/2023 at 5:23:06 PM.    Final    EEG adult Result Date:  08/25/2023 Shelton Arlin KIDD, MD     08/25/2023  3:49 PM Patient Name: GLENDELL SCHLOTTMAN MRN: [id number] Epilepsy Attending: Arlin KIDD Shelton Referring Physician/Provider: Alfornia Madison, MD Date: 08/25/2023 Duration: 22.32 mins Patient history: 72to F with syncope. EEG to evaluate for seizure Level of alertness: Awake, asleep AEDs during EEG study: GBP Technical aspects: This EEG study was done with scalp electrodes positioned according to the 10-20 International system of electrode placement. Electrical activity was reviewed with band pass filter of 1-70Hz , sensitivity of 7 uV/mm, display speed of 26mm/sec with a 60Hz  notched filter applied as appropriate. EEG data were recorded continuously and digitally stored.  Video monitoring was available and reviewed as appropriate. Description: The posterior dominant rhythm consists of 9-10 Hz activity of moderate voltage (25-35 uV) seen predominantly in posterior head regions, symmetric and reactive to eye opening and eye closing. Sleep was characterized by vertex waves, sleep spindles (12 to 14 Hz), maximal frontocentral region.  Hyperventilation and photic stimulation were not performed.   IMPRESSION: This study is within normal limits. No  seizures or epileptiform discharges were seen throughout the recording. A normal interictal EEG does not exclude the diagnosis of epilepsy. Arlin KIDD Shelton   ECHOCARDIOGRAM COMPLETE Result Date: 08/25/2023    ECHOCARDIOGRAM REPORT   Patient Name:   LIDIYA REISE Date of Exam: 08/25/2023 Medical Rec #:  [id number]          Height:       65.0 in Accession #:    [id number]         Weight:       187.0 lb Date of Birth:  1950-09-06         BSA:          1.922 m Patient Age:    72 years           BP:           128/80 mmHg Patient Gender: F                  HR:           85 bpm. Exam Location:  Inpatient Procedure: 2D Echo (Both Spectral and Color Flow Doppler were utilized during            procedure). Indications:    syncope  History:        Patient has no prior history of Echocardiogram examinations.                 Chronic kidney disease; Risk Factors:Hypertension, Dyslipidemia                 and Diabetes.  Sonographer:    Tinnie Barefoot RDCS Referring Phys: [id number] VASUNDHRA RATHORE IMPRESSIONS  1. Left ventricular ejection fraction, by estimation, is 60 to 65%. The left ventricle has normal function. The left ventricle has no regional wall motion abnormalities. Left ventricular diastolic parameters are consistent with Grade I diastolic dysfunction (impaired relaxation).  2. Right ventricular systolic function is normal. The right ventricular size is normal.  3. The mitral valve is normal in structure. No evidence of mitral valve regurgitation. No evidence of mitral stenosis.  4. The aortic valve is normal in structure. There is mild calcification of the aortic valve. Aortic valve regurgitation is not visualized. Aortic valve sclerosis/calcification is present, without any evidence of aortic stenosis.  5. The inferior vena cava is normal in size with greater than 50% respiratory variability, suggesting right atrial pressure of 3 mmHg.  FINDINGS  Left Ventricle: Left ventricular ejection fraction, by  estimation, is 60 to 65%. The left ventricle has normal function. The left ventricle has no regional wall motion abnormalities. The left ventricular internal cavity size was normal in size. There is  no left ventricular hypertrophy. Left ventricular diastolic parameters are consistent with Grade I diastolic dysfunction (impaired relaxation). Indeterminate filling pressures. Right Ventricle: The right ventricular size is normal. No increase in right ventricular wall thickness. Right ventricular systolic function is normal. Left Atrium: Left atrial size was normal in size. Right Atrium: Right atrial size was normal in size. Pericardium: There is no evidence of pericardial effusion. Mitral Valve: The mitral valve is normal in structure. No evidence of mitral valve regurgitation. No evidence of mitral valve stenosis. Tricuspid Valve: The tricuspid valve is normal in structure. Tricuspid valve regurgitation is not demonstrated. No evidence of tricuspid stenosis. Aortic Valve: The aortic valve is normal in structure. There is mild calcification of the aortic valve. Aortic valve regurgitation is not visualized. Aortic valve sclerosis/calcification is present, without any evidence of aortic stenosis. Pulmonic Valve: The pulmonic valve was normal in structure. Pulmonic valve regurgitation is not visualized. No evidence of pulmonic stenosis. Aorta: The aortic root is normal in size and structure. Venous: The inferior vena cava is normal in size with greater than 50% respiratory variability, suggesting right atrial pressure of 3 mmHg. IAS/Shunts: No atrial level shunt detected by color flow Doppler.  LEFT VENTRICLE PLAX 2D LVIDd:         4.50 cm   Diastology LVIDs:         3.10 cm   LV e' medial:    5.66 cm/s LV PW:         0.80 cm   LV E/e' medial:  12.9 LV IVS:        1.00 cm   LV e' lateral:   6.74 cm/s LVOT diam:     2.00 cm   LV E/e' lateral: 10.8 LV SV:         63 LV SV Index:   33 LVOT Area:     3.14 cm  RIGHT  VENTRICLE             IVC RV Basal diam:  2.70 cm     IVC diam: 1.10 cm RV S prime:     11.00 cm/s TAPSE (M-mode): 2.4 cm LEFT ATRIUM             Index        RIGHT ATRIUM           Index LA diam:        3.20 cm 1.66 cm/m   RA Area:     14.20 cm LA Vol (A2C):   43.5 ml 22.63 ml/m  RA Volume:   35.80 ml  18.62 ml/m LA Vol (A4C):   56.0 ml 29.13 ml/m LA Biplane Vol: 50.5 ml 26.27 ml/m  AORTIC VALVE LVOT Vmax:   96.10 cm/s LVOT Vmean:  64.900 cm/s LVOT VTI:    0.200 m  AORTA Ao Root diam: 3.10 cm Ao Asc diam:  3.00 cm MITRAL VALVE MV Area (PHT): 3.53 cm     SHUNTS MV Decel Time: 215 msec     Systemic VTI:  0.20 m MV E velocity: 73.10 cm/s   Systemic Diam: 2.00 cm MV A velocity: 104.00 cm/s MV E/A ratio:  0.70 Mihai Croitoru MD Electronically signed by Jerel Balding MD Signature Date/Time: 08/25/2023/2:00:21 PM    Final  CT ABDOMEN PELVIS W CONTRAST Result Date: 08/24/2023 EXAM: CT ABDOMEN AND PELVIS WITH CONTRAST 08/24/2023 11:15:10 PM TECHNIQUE: CT of the abdomen and pelvis was performed with the administration of 75 mL of intravenous iohexol  (OMNIPAQUE ) 350 MG/ML. Multiplanar reformatted images are provided for review. Automated exposure control, iterative reconstruction, and/or weight based adjustment of the mA/kV was utilized to reduce the radiation dose to as low as reasonably achievable. COMPARISON: 02/19/2022 CLINICAL HISTORY: Abdominal trauma, blunt. Pt c/o pain with fall; Trauma. FINDINGS: LOWER CHEST: No acute abnormality. LIVER: The liver is unremarkable. GALLBLADDER AND BILE DUCTS: Layering tiny gallstone (image 28), without associated inflammatory changes. No biliary ductal dilatation. SPLEEN: No acute abnormality. PANCREAS: No acute abnormality. ADRENAL GLANDS: No acute abnormality. KIDNEYS, URETERS AND BLADDER: Subcentimeter left anterior interpolar renal cyst (image 33), benign (Bosniak 1). No follow-up recommended. No stones in the kidneys or ureters. No hydronephrosis. No perinephric or  periureteral stranding. Urinary bladder is unremarkable. GI AND BOWEL: Normal appendix (image 60). Sigmoid diverticulosis, without evidence of diverticulitis. Stomach demonstrates no acute abnormality. There is no bowel obstruction. No bowel wall thickening. PERITONEUM AND RETROPERITONEUM: No ascites. No free air. VASCULATURE: Aorta is normal in caliber. LYMPH NODES: No lymphadenopathy. REPRODUCTIVE ORGANS: Status post hysterectomy. No acute abnormality. BONES AND SOFT TISSUES: Postsurgical changes along the lower anterior abdominal wall. Moderate fat-containing periumbilical hernia (image 50). Degenerative changes of the visualized thoracolumbar spine. Suspected global pelvic floor laxity. Nondisplaced right superior and inferior pubic ramus fractures. Mild degenerative changes of the right hip. IMPRESSION: 1. Nondisplaced right superior and inferior pubic ramus fractures. 2. Additional ancillary findings as above. Electronically signed by: Pinkie Pebbles MD 08/24/2023 11:27 PM EDT RP Workstation: [id number]   CT Head Wo Contrast Result Date: 08/24/2023 EXAM: CT HEAD AND CERVICAL SPINE 08/24/2023 11:15:10 PM TECHNIQUE: CT of the head and cervical spine was performed without the administration of intravenous contrast. Multiplanar reformatted images are provided for review. Automated exposure control, iterative reconstruction, and/or weight based adjustment of the mA/kV was utilized to reduce the radiation dose to as low as reasonably achievable. COMPARISON: 12/02/2021 CLINICAL HISTORY: Head trauma, minor (Age >= 65y). Pt c/o pain with fall; Trauma. FINDINGS: CT HEAD BRAIN AND VENTRICLES: No acute intracranial hemorrhage. No mass effect or midline shift. No abnormal extra-axial fluid collection. Gray-white differentiation is maintained. No hydrocephalus. ORBITS: No acute abnormality. SINUSES AND MASTOIDS: No acute abnormality. SOFT TISSUES AND SKULL: No acute skull fracture. No acute soft tissue abnormality. CT  CERVICAL SPINE BONES AND ALIGNMENT: No acute fracture or traumatic malalignment. DEGENERATIVE CHANGES: Moderate spinal canal stenosis at C4-5 with mild bilateral foraminal stenosis. SOFT TISSUES: No prevertebral soft tissue swelling. IMPRESSION: 1. No acute intracranial abnormality. 2. No acute fracture or traumatic malalignment of the cervical spine. 3. Moderate spinal canal stenosis at C4-5 with mild bilateral foraminal stenosis. Electronically signed by: Franky Stanford MD 08/24/2023 11:20 PM EDT RP Workstation: [id number]   CT Cervical Spine Wo Contrast Result Date: 08/24/2023 EXAM: CT HEAD AND CERVICAL SPINE 08/24/2023 11:15:10 PM TECHNIQUE: CT of the head and cervical spine was performed without the administration of intravenous contrast. Multiplanar reformatted images are provided for review. Automated exposure control, iterative reconstruction, and/or weight based adjustment of the mA/kV was utilized to reduce the radiation dose to as low as reasonably achievable. COMPARISON: 12/02/2021 CLINICAL HISTORY: Head trauma, minor (Age >= 65y). Pt c/o pain with fall; Trauma. FINDINGS: CT HEAD BRAIN AND VENTRICLES: No acute intracranial hemorrhage. No mass effect or midline shift. No abnormal extra-axial fluid collection.  Gray-white differentiation is maintained. No hydrocephalus. ORBITS: No acute abnormality. SINUSES AND MASTOIDS: No acute abnormality. SOFT TISSUES AND SKULL: No acute skull fracture. No acute soft tissue abnormality. CT CERVICAL SPINE BONES AND ALIGNMENT: No acute fracture or traumatic malalignment. DEGENERATIVE CHANGES: Moderate spinal canal stenosis at C4-5 with mild bilateral foraminal stenosis. SOFT TISSUES: No prevertebral soft tissue swelling. IMPRESSION: 1. No acute intracranial abnormality. 2. No acute fracture or traumatic malalignment of the cervical spine. 3. Moderate spinal canal stenosis at C4-5 with mild bilateral foraminal stenosis. Electronically signed by: Franky Stanford MD 08/24/2023  11:20 PM EDT RP Workstation: [id number]   DG Hip Unilat W or Wo Pelvis 2-3 Views Right Result Date: 08/24/2023 CLINICAL DATA:  Status post fall. EXAM: DG HIP (WITH OR WITHOUT PELVIS) 2-3V RIGHT COMPARISON:  None Available. FINDINGS: There is no evidence of an acute hip fracture or dislocation. Marked severity degenerative changes are seen in the of joint space narrowing, acetabular sclerosis, lateral acetabular bony spurring and subchondral cyst formation. IMPRESSION: Marked severity degenerative changes of the right hip. Electronically Signed   By: Suzen Dials M.D.   On: 08/24/2023 19:48    Microbiology: Results for orders placed or performed during the hospital encounter of 08/24/23  Urine Culture (for pregnant, neutropenic or urologic patients or patients with an indwelling urinary catheter)     Status: Abnormal   Collection Time: 08/25/23  2:51 PM   Specimen: Urine, Clean Catch  Result Value Ref Range Status   Specimen Description URINE, CLEAN CATCH  Final   Special Requests NONE  Final   Culture (A)  Final    10,000 COLONIES/mL STREPTOCOCCUS AGALACTIAE TESTING AGAINST S. AGALACTIAE NOT ROUTINELY PERFORMED DUE TO PREDICTABILITY OF AMP/PEN/VAN SUSCEPTIBILITY. Performed at [facility], [address] [place], [place] [address]    Report Status 08/28/2023 FINAL  Final    Labs: CBC: Recent Labs  Lab 08/24/23 1931 08/25/23 0443 08/26/23 0409 08/27/23 0541  WBC 11.3* 10.0 8.4 9.1  NEUTROABS 8.0*  --  4.7 5.6  HGB 12.6 11.9* 11.7* 11.4*  HCT 39.7 37.2 36.4 35.4*  MCV 91.5 91.2 90.8 91.9  PLT 265 244 248 255   Basic Metabolic Panel: Recent Labs  Lab 08/24/23 1931 08/26/23 0409 08/27/23 0541  NA 140 140 138  K 3.5 3.4* 3.7  CL 104 101 103  CO2 23 28 24   GLUCOSE 143* 99 118*  BUN 20 15 18   CREATININE 1.05* 0.83 0.89  CALCIUM  9.1 9.2 9.0  MG  --  1.7  --    Liver Function Tests: Recent Labs  Lab 08/24/23 1931  AST 26  ALT 13  ALKPHOS 45  BILITOT 0.9   PROT 7.1  ALBUMIN 3.4*   CBG: Recent Labs  Lab 08/28/23 0745 08/28/23 1209 08/28/23 1602 08/28/23 2033 08/29/23 0833  GLUCAP 101* 147* 154* 221* 138*    Discharge time spent: greater than 30 minutes.  Signed: Owen DELENA Lore, MD [facility] 08/29/2023

## 2023-08-29 NOTE — Progress Notes (Signed)
 Called report to [facility] for patient discharge to room 1004B.

## 2023-08-29 NOTE — TOC Transition Note (Signed)
 Transition of Care [facility]) - Discharge Note   Patient Details  Name: Rachel Herman MRN: [id number] Date of Birth: 07-Aug-1950  [facility] CM/SW Contact:  Jeoffrey LITTIE Moose, LCSW Phone Number: 08/29/2023, 12:21 PM   Clinical Narrative:    Patient will DC to: [facility] Anticipated DC date: 08/29/23  Family notified: Yes Transport by: [facility]   Per MD patient ready for DC to [facility]  . RN to call report prior to discharge [phone] . RN, patient, patient's family, and facility notified of DC. Discharge Summary and FL2 sent to facility. DC packet on chart along with scripts. Ambulance transport requested for patient.   CSW will sign off for now as social work intervention is no longer needed. Please consult us  again if new needs arise.     Final next level of care: Skilled Nursing Facility Barriers to Discharge: Barriers Resolved   Patient Goals and CMS Choice Patient states their goals for this hospitalization and ongoing recovery are:: Go to rehab and then home          Discharge Placement   Existing PASRR number confirmed : 08/29/23          Patient [facility]: [facility] Patient to be transferred to facility by: [facility] Name of family member notified: [place] Patient and family notified of of transfer: 08/29/23  Discharge Plan and Services Additional resources added to the After Visit Summary for                                       Social Drivers of Health (SDOH) Interventions SDOH Screenings   Food Insecurity: No Food Insecurity (08/26/2023)  Housing: Low Risk  (08/26/2023)  Transportation Needs: No Transportation Needs (08/26/2023)  Utilities: Not At Risk (08/26/2023)  Alcohol Screen: Low Risk  (04/29/2023)  Depression (PHQ2-9): Low Risk  (08/15/2023)  Financial Resource Strain: High Risk (04/29/2023)  Physical Activity: Sufficiently Active (04/29/2023)  Social Connections: Moderately Isolated (08/26/2023)  Stress: Stress Concern Present  (04/29/2023)  Tobacco Use: Low Risk  (08/29/2023)  Health Literacy: Adequate Health Literacy (04/29/2023)     Readmission Risk Interventions     No data to display

## 2023-08-29 NOTE — Progress Notes (Signed)
 Occupational Therapy Treatment Patient Details Name: Rachel Herman MRN: [id number] DOB: 10-08-1950 Today's Date: 08/29/2023   History of present illness Pt is a 73 y/o F admitted on 08/24/23 after presenting with c/o syncope & fall resulting in R sided hip/pelvic pain. CT showed nondisplaced right superior & inferior pubic ramus fractures. Ortho was consulted & recommending non operative management & WBAT. PMH: breast CA in remission, DM2, CKD 3A, GERD, HTN, HLD, obesity, chronic back pain/lumbar radiculopathy   OT comments  OT session focused in training in techniques for increased safety and independence with all steps of toileting task and bed mobility and functional transfers/mobility in preparation for/during ADLs. Pt currently demonstrating ability to largely complete ADLs wit Mod I to Mod assist, bed mobility with Supervision to Contact guard assist, and functional mobility/transfers with a RW with Contact guard assist for safety with no physical assist needed to power up or steady. Pt participated well in session and is making good progress toward goals. However, pain remains a limiting factor. Pt's VSS on RA throughout session. Plan for pt to discharge to intensive inpatient skilled rehab services < 3 hours per day later this afternoon. If pt is not discharged as planned, she will benefit from continued acute skilled OT services.   HR: 70s O2 sat: >/98% on RA BP in supine: 130/75 (92) BP in sitting: 128/74 (89) BP in standing: 128/81 (92)      If plan is discharge home, recommend the following:  A little help with walking and/or transfers;Assistance with cooking/housework;A lot of help with bathing/dressing/bathroom;Assist for transportation;Help with stairs or ramp for entrance   Equipment Recommendations  None recommended by OT    Recommendations for Other Services      Precautions / Restrictions Precautions Precautions: Fall Recall of Precautions/Restrictions:  Intact Restrictions Weight Bearing Restrictions Per Provider Order: Yes RLE Weight Bearing Per Provider Order: Weight bearing as tolerated LLE Weight Bearing Per Provider Order: Weight bearing as tolerated       Mobility Bed Mobility Overal bed mobility: Needs Assistance Bed Mobility: Supine to Sit, Sit to Supine     Supine to sit: Supervision, HOB elevated, Used rails Sit to supine: Contact guard assist, HOB elevated, Used rails   General bed mobility comments: CGA to evelate B LE into bed; otherwise Supervision for safety    Transfers Overall transfer level: Needs assistance Equipment used: Rolling walker (2 wheels) Transfers: Sit to/from Stand, Bed to chair/wheelchair/BSC Sit to Stand: Contact guard assist     Step pivot transfers: Contact guard assist     General transfer comment: CGA for safety; no physical assist needed to power up or to maintain balance in standing/stepping with use of RW     Balance Overall balance assessment: Needs assistance Sitting-balance support: No upper extremity supported, Feet supported Sitting balance-Leahy Scale: Good Sitting balance - Comments: Pt sat EOB with supervision.   Standing balance support: Single extremity supported, Bilateral upper extremity supported, During functional activity, Reliant on assistive device for balance Standing balance-Leahy Scale: Fair Standing balance comment: Pt dependent on RW                           ADL either performed or assessed with clinical judgement   ADL Overall ADL's : Needs assistance/impaired Eating/Feeding: Modified independent;Sitting   Grooming: Contact guard assist;Standing           Upper Body Dressing : Set up;Sitting       Toilet Transfer:  Contact guard assist;Ambulation;Regular Toilet;Grab bars   Toileting- Clothing Manipulation and Hygiene: Supervision/safety;Sitting/lateral lean;Contact guard assist;Sit to/from Statistician  Details (indicate cue type and reason): Supervision in sitting; CGA in standing for safety     Functional mobility during ADLs: Contact guard assist;Rolling walker (2 wheels) General ADL Comments: Pain and decreased activity tolerance affecting funcitonal level    Extremity/Trunk Assessment Upper Extremity Assessment Upper Extremity Assessment: Right hand dominant;Overall WFL for tasks assessed   Lower Extremity Assessment Lower Extremity Assessment: Defer to PT evaluation        Vision       Perception     Praxis     Communication Communication Communication: No apparent difficulties   Cognition Arousal: Alert Behavior During Therapy: WFL for tasks assessed/performed Cognition: No apparent impairments             OT - Cognition Comments: Pt AAOx4 and pleasant throughout session                 Following commands: Intact        Cueing   Cueing Techniques: Verbal cues  Exercises      Shoulder Instructions       General Comments Orthos taken with BP in supine 130/75 (92), in sitting 128/74 (89), and standing 128/81 (92) with pt reporting no dizziness or lightheadedness throughout session. HR in the 70s and O2 sat >/ 98% on RA    Pertinent Vitals/ Pain       Pain Assessment Pain Assessment: Faces Faces Pain Scale: Hurts whole lot Pain Location: Rt hip/pelvis Pain Descriptors / Indicators: Grimacing, Guarding, Discomfort, Aching Pain Intervention(s): Limited activity within patient's tolerance, Monitored during session, Premedicated before session, Repositioned  Home Living                                          Prior Functioning/Environment              Frequency  Min 2X/week        Progress Toward Goals  OT Goals(current goals can now be found in the care plan section)  Progress towards OT goals: Progressing toward goals  Acute Rehab OT Goals Patient Stated Goal: to be able to go to her step-son's wedding in  August and her own wedding in September  Plan      Co-evaluation                 AM-PAC OT 6 Clicks Daily Activity     Outcome Measure   Help from another person eating meals?: None Help from another person taking care of personal grooming?: A Little Help from another person toileting, which includes using toliet, bedpan, or urinal?: A Little Help from another person bathing (including washing, rinsing, drying)?: A Lot Help from another person to put on and taking off regular upper body clothing?: A Little Help from another person to put on and taking off regular lower body clothing?: A Lot 6 Click Score: 17    End of Session Equipment Utilized During Treatment: Gait belt;Rolling walker (2 wheels)  OT Visit Diagnosis: Unsteadiness on feet (R26.81);Pain   Activity Tolerance Patient tolerated treatment well   Patient Left in bed;with call bell/phone within reach;with bed alarm set   Nurse Communication Mobility status;Other (comment) (VSS on RA; orthos completed and BP stable in all positions)  Time: 8878-8857 OT Time Calculation (min): 21 min  Charges: OT General Charges $OT Visit: 1 Visit OT Treatments $Self Care/Home Management : 8-22 mins  Rachel Rockey HERO., OTR/L, MA Acute Rehab [phone]   Rachel Herman 08/29/2023, 11:59 AM

## 2023-09-01 DIAGNOSIS — E0822 Diabetes mellitus due to underlying condition with diabetic chronic kidney disease: Secondary | ICD-10-CM | POA: Diagnosis not present

## 2023-09-01 DIAGNOSIS — I10 Essential (primary) hypertension: Secondary | ICD-10-CM | POA: Diagnosis not present

## 2023-09-01 DIAGNOSIS — R55 Syncope and collapse: Secondary | ICD-10-CM | POA: Diagnosis not present

## 2023-09-01 DIAGNOSIS — S060X0A Concussion without loss of consciousness, initial encounter: Secondary | ICD-10-CM | POA: Diagnosis not present

## 2023-09-01 DIAGNOSIS — E1140 Type 2 diabetes mellitus with diabetic neuropathy, unspecified: Secondary | ICD-10-CM | POA: Diagnosis not present

## 2023-09-01 DIAGNOSIS — E041 Nontoxic single thyroid nodule: Secondary | ICD-10-CM | POA: Diagnosis not present

## 2023-09-02 DIAGNOSIS — S32599D Other specified fracture of unspecified pubis, subsequent encounter for fracture with routine healing: Secondary | ICD-10-CM | POA: Diagnosis not present

## 2023-09-02 DIAGNOSIS — M6281 Muscle weakness (generalized): Secondary | ICD-10-CM | POA: Diagnosis not present

## 2023-09-02 DIAGNOSIS — R2689 Other abnormalities of gait and mobility: Secondary | ICD-10-CM | POA: Diagnosis not present

## 2023-09-02 DIAGNOSIS — R55 Syncope and collapse: Secondary | ICD-10-CM | POA: Diagnosis not present

## 2023-09-03 DIAGNOSIS — S32599A Other specified fracture of unspecified pubis, initial encounter for closed fracture: Secondary | ICD-10-CM | POA: Diagnosis not present

## 2023-09-03 DIAGNOSIS — E1140 Type 2 diabetes mellitus with diabetic neuropathy, unspecified: Secondary | ICD-10-CM | POA: Diagnosis not present

## 2023-09-03 DIAGNOSIS — R55 Syncope and collapse: Secondary | ICD-10-CM | POA: Diagnosis not present

## 2023-09-03 DIAGNOSIS — E041 Nontoxic single thyroid nodule: Secondary | ICD-10-CM | POA: Diagnosis not present

## 2023-09-03 DIAGNOSIS — I1310 Hypertensive heart and chronic kidney disease without heart failure, with stage 1 through stage 4 chronic kidney disease, or unspecified chronic kidney disease: Secondary | ICD-10-CM | POA: Diagnosis not present

## 2023-09-04 DIAGNOSIS — E041 Nontoxic single thyroid nodule: Secondary | ICD-10-CM | POA: Diagnosis not present

## 2023-09-04 DIAGNOSIS — I10 Essential (primary) hypertension: Secondary | ICD-10-CM | POA: Diagnosis not present

## 2023-09-04 DIAGNOSIS — R55 Syncope and collapse: Secondary | ICD-10-CM | POA: Diagnosis not present

## 2023-09-04 DIAGNOSIS — S060X0A Concussion without loss of consciousness, initial encounter: Secondary | ICD-10-CM | POA: Diagnosis not present

## 2023-09-04 DIAGNOSIS — E1140 Type 2 diabetes mellitus with diabetic neuropathy, unspecified: Secondary | ICD-10-CM | POA: Diagnosis not present

## 2023-09-04 DIAGNOSIS — E0822 Diabetes mellitus due to underlying condition with diabetic chronic kidney disease: Secondary | ICD-10-CM | POA: Diagnosis not present

## 2023-09-05 DIAGNOSIS — I1310 Hypertensive heart and chronic kidney disease without heart failure, with stage 1 through stage 4 chronic kidney disease, or unspecified chronic kidney disease: Secondary | ICD-10-CM | POA: Diagnosis not present

## 2023-09-05 DIAGNOSIS — M6281 Muscle weakness (generalized): Secondary | ICD-10-CM | POA: Diagnosis not present

## 2023-09-05 DIAGNOSIS — I10 Essential (primary) hypertension: Secondary | ICD-10-CM | POA: Diagnosis not present

## 2023-09-05 DIAGNOSIS — E041 Nontoxic single thyroid nodule: Secondary | ICD-10-CM | POA: Diagnosis not present

## 2023-09-05 DIAGNOSIS — S32599A Other specified fracture of unspecified pubis, initial encounter for closed fracture: Secondary | ICD-10-CM | POA: Diagnosis not present

## 2023-09-05 DIAGNOSIS — R2689 Other abnormalities of gait and mobility: Secondary | ICD-10-CM | POA: Diagnosis not present

## 2023-09-05 DIAGNOSIS — E0822 Diabetes mellitus due to underlying condition with diabetic chronic kidney disease: Secondary | ICD-10-CM | POA: Diagnosis not present

## 2023-09-05 DIAGNOSIS — R55 Syncope and collapse: Secondary | ICD-10-CM | POA: Diagnosis not present

## 2023-09-05 DIAGNOSIS — E1140 Type 2 diabetes mellitus with diabetic neuropathy, unspecified: Secondary | ICD-10-CM | POA: Diagnosis not present

## 2023-09-05 DIAGNOSIS — S32599D Other specified fracture of unspecified pubis, subsequent encounter for fracture with routine healing: Secondary | ICD-10-CM | POA: Diagnosis not present

## 2023-09-09 ENCOUNTER — Other Ambulatory Visit: Payer: Self-pay | Admitting: Internal Medicine

## 2023-09-09 DIAGNOSIS — I10 Essential (primary) hypertension: Secondary | ICD-10-CM

## 2023-09-09 DIAGNOSIS — K219 Gastro-esophageal reflux disease without esophagitis: Secondary | ICD-10-CM | POA: Diagnosis not present

## 2023-09-09 DIAGNOSIS — R55 Syncope and collapse: Secondary | ICD-10-CM | POA: Diagnosis not present

## 2023-09-09 DIAGNOSIS — S060X0A Concussion without loss of consciousness, initial encounter: Secondary | ICD-10-CM | POA: Diagnosis not present

## 2023-09-09 DIAGNOSIS — E785 Hyperlipidemia, unspecified: Secondary | ICD-10-CM | POA: Diagnosis not present

## 2023-09-09 DIAGNOSIS — Z853 Personal history of malignant neoplasm of breast: Secondary | ICD-10-CM | POA: Diagnosis not present

## 2023-09-09 DIAGNOSIS — E1140 Type 2 diabetes mellitus with diabetic neuropathy, unspecified: Secondary | ICD-10-CM | POA: Diagnosis not present

## 2023-09-09 DIAGNOSIS — E1122 Type 2 diabetes mellitus with diabetic chronic kidney disease: Secondary | ICD-10-CM | POA: Diagnosis not present

## 2023-09-09 DIAGNOSIS — I130 Hypertensive heart and chronic kidney disease with heart failure and stage 1 through stage 4 chronic kidney disease, or unspecified chronic kidney disease: Secondary | ICD-10-CM | POA: Diagnosis not present

## 2023-09-09 DIAGNOSIS — M1611 Unilateral primary osteoarthritis, right hip: Secondary | ICD-10-CM | POA: Diagnosis not present

## 2023-09-09 DIAGNOSIS — N1831 Chronic kidney disease, stage 3a: Secondary | ICD-10-CM | POA: Diagnosis not present

## 2023-09-10 ENCOUNTER — Ambulatory Visit: Admitting: Orthopedic Surgery

## 2023-09-10 ENCOUNTER — Other Ambulatory Visit: Payer: Self-pay

## 2023-09-10 ENCOUNTER — Encounter: Payer: Self-pay | Admitting: Orthopedic Surgery

## 2023-09-10 ENCOUNTER — Other Ambulatory Visit (INDEPENDENT_AMBULATORY_CARE_PROVIDER_SITE_OTHER)

## 2023-09-10 DIAGNOSIS — M1611 Unilateral primary osteoarthritis, right hip: Secondary | ICD-10-CM

## 2023-09-10 DIAGNOSIS — M25551 Pain in right hip: Secondary | ICD-10-CM | POA: Diagnosis not present

## 2023-09-10 NOTE — Progress Notes (Signed)
 Office Visit Note   Patient: Rachel Herman           Date of Birth: 26-Mar-1950           MRN: [id number] Visit Date: 09/10/2023 Requested by: Vicci Barnie NOVAK, MD [address] [place],  [place] [address] PCP: Vicci Barnie NOVAK, MD  Subjective: Chief Complaint  Patient presents with   Other    Follow up fractures    HPI: Rachel Herman is a 73 y.o. female who presents to the office reporting pelvic pain as well as right hip pain.  Patient had a fall on 08/24/2023 due to a syncopal episode.  Diagnosed with rami fractures at the time.  She is using a walker.  She is able to get up and down out of bed.  Doing physical therapy at [facility]..                ROS: All systems reviewed are negative as they relate to the chief complaint within the history of present illness.  Patient denies fevers or chills.  Assessment & Plan: Visit Diagnoses:  1. Pain in right hip   2. Arthritis of right hip     Plan: Impression is healing rami fractures with fairly severe arthritis in the right hip.  Plan at this time is continue to mobilize weightbearing as tolerated.  I think we can help her with some of the groin pain temporarily from the severe arthritis with a ultrasound-guided injection into the hip joint.  Continue with weightbearing as tolerated.  3-week return for final check on the rami fractures.  Follow-Up Instructions: No follow-ups on file.   Orders:  Orders Placed This Encounter  Procedures   XR Pelvis 1-2 Views   US  Guided Needle Placement - No Linked Charges   No orders of the defined types were placed in this encounter.     Procedures: No procedures performed   Clinical Data: No additional findings.  Objective: Vital Signs: There were no vitals taken for this visit.  Physical Exam:  Constitutional: Patient appears well-developed HEENT:  Head: Normocephalic Eyes:EOM are normal Neck: Normal range of motion Cardiovascular: Normal  rate Pulmonary/chest: Effort normal Neurologic: Patient is alert Skin: Skin is warm Psychiatric: Patient has normal mood and affect  Ortho Exam: Ortho exam demonstrates 5 out of 5 ankle dorsiflexion plantarflexion quad and hamstring strength.  Does have diminished range of motion on the right hip compared to the left hip.  Some pain with resisted adduction consistent with her rami fractures on the right.  Pedal pulses palpable on both feet and no paresthesias L1-S1 bilaterally.  Specialty Comments:  No specialty comments available.  Imaging: No results found.   PMFS History: Patient Active Problem List   Diagnosis Date Noted   Syncope 08/25/2023   Pelvic fracture (HCC) 08/25/2023   Closed fracture of multiple pubic rami (HCC) 08/25/2023   Stage 3a chronic kidney disease (HCC) 03/01/2022   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   Pneumococcal vaccination declined 10/18/2019   Chronic cough 08/03/2018   Other intervertebral disc degeneration, lumbar region 03/18/2018   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/21/2016   Lumbar pain with radiation down left and right leg 07/02/2016   Thyroid  nodule 06/09/2015   Nodule of right lung 07/28/2014   Diverticulosis of colon without hemorrhage 07/28/2014   DJD (degenerative joint disease), lumbar 02/22/2014   Lumbar pain with radiation down left leg 01/17/2014  Breast cancer of upper-inner quadrant of left female breast (HCC) 01/05/2014   Hyperlipidemia associated with type 2 diabetes mellitus (HCC) 09/28/2013   S/P total hysterectomy and bilateral salpingo-oophorectomy 09/20/2013   Essential hypertension    Diabetes mellitus (HCC) 09/16/2013   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   Diabetes mellitus (HCC) 09/16/13   Diagnosed on 09/16/13; HgA1C was 7.2/metformin    Dizziness    GERD (gastroesophageal  reflux disease)    Headache    Hyperlipidemia    Hypertension 2014   Low back pain    Obesity, morbid, BMI 40.0-49.9 (HCC)    PONV (postoperative nausea and vomiting)    S/P radiation therapy 04/05/14-05/20/14   left breast 60.4Gy totaldose    Family History  Problem Relation Age of Onset   Diabetes Mother    Multiple myeloma Mother 46   Hearing loss Mother    Diabetes Brother    Cancer Brother 40       prostate cancer    Diabetes Maternal Aunt    Leukemia Maternal Aunt        dx in her 10s   Alcoholism Brother    Heart attack Father    Diabetes Sister    Diabetes Son     Past Surgical History:  Procedure Laterality Date   ABDOMINAL HYSTERECTOMY N/A 09/20/2013   Procedure: HYSTERECTOMY ABDOMINAL;  Surgeon: Gloris DELENA Hugger, MD;  Location: [facility];  Service: Gynecology;  Laterality: N/A;   BREAST LUMPECTOMY Left 2015   radiation   BREAST LUMPECTOMY WITH AXILLARY LYMPH NODE BIOPSY Left 12/29/13   left breast    CATARACT EXTRACTION Right    LAPAROSCOPIC ASSISTED VAGINAL HYSTERECTOMY  09/20/2013   Procedure: LAPAROSCOPIC ASSISTED VAGINAL HYSTERECTOMY;  Surgeon: Gloris DELENA Hugger, MD;  Location: [facility];  Service: Gynecology;;  PT WAS EXAMINED WHILE UP IN [place] AND IT WAS DECIDED TO OPEN PT DUE TO LARGE MASS   LUMBAR LAMINECTOMY/DECOMPRESSION MICRODISCECTOMY Right 12/14/2014   Procedure: Right Lumbar three-four microdiskectomy;  Surgeon: Reyes Budge, MD;  Location: [facility];  Service: Neurosurgery;  Laterality: Right;  Right Lumbar three-four microdiskectomy   RADIOACTIVE SEED GUIDED PARTIAL MASTECTOMY WITH AXILLARY SENTINEL LYMPH NODE BIOPSY Left 12/29/2013   Procedure: LEFT BREAST RADIOACTIVE SEED LOCALIZED LUMPECTOMY WITH SENTINEL LYMPH NODE MAPPING;  Surgeon: Debby Shipper, MD;  Location: [facility];  Service: General;  Laterality: Left;   RE-EXCISION OF BREAST LUMPECTOMY Left 03/02/2014   Procedure: RE-EXCISION OF LEFT BREAST LUMPECTOMY;  Surgeon: Debby Shipper, MD;  Location: [facility];  Service: General;  Laterality: Left;   SALPINGOOPHORECTOMY  09/20/2013   Procedure: SALPINGO OOPHORECTOMY;  Surgeon: Gloris DELENA Hugger, MD;  Location: [facility];  Service: Gynecology;;   Social History   Occupational History    Employer: UNEMPLOYED  Tobacco Use   Smoking status: Never   Smokeless tobacco: Never  Vaping Use   Vaping status: Never Used  Substance and Sexual Activity   Alcohol use: No   Drug use: No   Sexual activity: Not Currently    Birth control/protection: Post-menopausal

## 2023-09-11 DIAGNOSIS — R55 Syncope and collapse: Secondary | ICD-10-CM | POA: Diagnosis not present

## 2023-09-11 DIAGNOSIS — E041 Nontoxic single thyroid nodule: Secondary | ICD-10-CM | POA: Diagnosis not present

## 2023-09-11 DIAGNOSIS — I10 Essential (primary) hypertension: Secondary | ICD-10-CM | POA: Diagnosis not present

## 2023-09-11 DIAGNOSIS — E1140 Type 2 diabetes mellitus with diabetic neuropathy, unspecified: Secondary | ICD-10-CM | POA: Diagnosis not present

## 2023-09-11 DIAGNOSIS — I1310 Hypertensive heart and chronic kidney disease without heart failure, with stage 1 through stage 4 chronic kidney disease, or unspecified chronic kidney disease: Secondary | ICD-10-CM | POA: Diagnosis not present

## 2023-09-11 DIAGNOSIS — S060X0A Concussion without loss of consciousness, initial encounter: Secondary | ICD-10-CM | POA: Diagnosis not present

## 2023-09-17 ENCOUNTER — Encounter: Admitting: Orthopedic Surgery

## 2023-09-18 NOTE — Progress Notes (Deleted)
 [facility]:    Date:  09/18/2023   ID:  Rachel Herman, DOB [birth year], MRN [id number]  PCP:  Vicci Barnie NOVAK, MD  Cardiologist:  None { Click to update primary MD,subspecialty MD or APP then REFRESH:1}    Referring MD: Vicci Barnie NOVAK, MD   Chief Complaint: syncope  History of Present Illness:    Rachel Herman is a 73 y.o. female with a history of hypertension, hyperlipidemia, type 2 diabetes mellitus, CKD stage IIIa, breast cancer (in remission), chronic back pain with lumbar radiculopathy, and recent pelvic fracture who presents today as a new patient for further evaluation of syncope.   Patient was recently admitted from 08/24/2023 to 08/2023 for syncope resulting in a fall and right superior and inferior pubic ramus fractures. Pelvic fractures were treated conservatively. Patient reported no prodromal symptoms prior to syncopal episode. She was not hypoglycemic. Head CT showed no acute findings. It does not look like orthostatic vital signs were check until day of discharge but they were negative at that time. Echo showed LVEF of 60-65% with normal LV function and grade 1 diastolic dysfunction, normal RV function, and no significant  valvular disease. Carotid ultrasound showed near normal vessels on the left on only 1-39% stenosis of right ICA. Lower extremity doppler negative for DVT. Etiology of syncope was felt to be unclear. Home Clonidine  and HCTZ were held. Cardiology was not formally consulted but was asked to arrange a monitor at discharge. Monitor showed ***.   Patient presents today for follow-up. ***  Syncope Patient was recently admitted in 08/2023 for syncope. Work-up was unremarkable. *** Echo showed normal LV function with no significant valvular disease. Carotid dopplers showed no significant disease. Outpatient monitor was ordered and showed *** - *** - Discussed the [place] DMV medical guidelines for driving: it is prudent to recommend that  all persons should be free of syncopal episodes for at least six months to be granted the driving privilege. (THE [place]  PHYSICIAN'S GUIDE TO DRIVER MEDICAL EVALUATION, Second Edition, Medical Review Branch, Associate Professor, Division of [employer], [place]  Department of Transportation, July 2004).  Hypertension BP *** - Continue Coreg  12.5mg  twice daily.   - Previously on Clonidine  and HCTZ as well but these were stopped during recent admission.  Hyperlipidemia Lipid panel in  06/2022: Total Cholesterol 135, Triglycerides 236, HDL 40, LDL 57.  - Continue Lipitor 20mg  daily.  - Labs followed by PCP.  Type 2 Diabetes Mellitus  - On Metformin .  - Management per PCP.  CKD Stage IIIa Baseline creatinine around 0.8 to 1.0.   EKGs/Labs/Other Studies Reviewed:    The following studies were reviewed:  Echocardiogram 08/25/2023: Impressions: 1. Left ventricular ejection fraction, by estimation, is 60 to 65%. The  left ventricle has normal function. The left ventricle has no regional  wall motion abnormalities. Left ventricular diastolic parameters are  consistent with Grade I diastolic  dysfunction (impaired relaxation).   2. Right ventricular systolic function is normal. The right ventricular  size is normal.   3. The mitral valve is normal in structure. No evidence of mitral valve  regurgitation. No evidence of mitral stenosis.   4. The aortic valve is normal in structure. There is mild calcification  of the aortic valve. Aortic valve regurgitation is not visualized. Aortic  valve sclerosis/calcification is present, without any evidence of aortic  stenosis.   5. The inferior vena cava is normal in size with greater than 50%  respiratory  variability, suggesting right atrial pressure of 3 mmHg.  _______________  Carotid Dopplers 08/25/2023: Summary: - Right Carotid: Velocities in the right ICA are consistent with a 1-39% stenosis.  - Left Carotid: The  extracranial vessels were near-normal with only minimal wall thickening or plaque.  - Vertebrals:  Bilateral vertebral arteries demonstrate antegrade flow.  - Subclavians: Normal flow hemodynamics were seen in bilateral subclavian arteries.  _______________  Lower Extremity Venous Dopplers 08/26/2023: Summary:  RIGHT:  - There is no evidence of deep vein thrombosis in the lower extremity.  - No cystic structure found in the popliteal fossa.    LEFT:  - There is no evidence of deep vein thrombosis in the lower extremity.  - No cystic structure found in the popliteal fossa.  _______________  Monitor ***  EKG:  EKG not ordered today.   Recent Labs: 08/24/2023: ALT 13 08/26/2023: B Natriuretic Peptide 86.3; Magnesium  1.7; TSH 1.385 08/27/2023: BUN 18; Creatinine, Ser 0.89; Hemoglobin 11.4; Platelets 255; Potassium 3.7; Sodium 138  Recent Lipid Panel    Component Value Date/Time   CHOL 135 07/03/2022 0922   TRIG 236 (H) 07/03/2022 0922   HDL 40 07/03/2022 0922   CHOLHDL 3.4 07/03/2022 0922   CHOLHDL 5.9 09/27/2013 1053   VLDL 48 (H) 09/27/2013 1053   LDLCALC 57 07/03/2022 0922    Physical Exam:    Vital Signs: There were no vitals taken for this visit.    Wt Readings from Last 3 Encounters:  08/25/23 194 lb 7.1 oz (88.2 kg)  08/15/23 187 lb (84.8 kg)  05/05/23 189 lb 1 oz (85.8 kg)     General: 73 y.o. female in no acute distress. HEENT: Normocephalic and atraumatic. Sclera clear.  Neck: Supple. No carotid bruits. No JVD. Heart: *** RRR. Distinct S1 and S2. No murmurs, gallops, or rubs.  Lungs: No increased work of breathing. Clear to ausculation bilaterally. No wheezes, rhonchi, or rales.  Abdomen: Soft, non-distended, and non-tender to palpation.  Extremities: No lower extremity edema.  Radial and distal pedal pulses 2+ and equal bilaterally. Skin: Warm and dry. Neuro: No focal deficits. Psych: Normal affect. Responds appropriately.   Assessment:    No diagnosis  found.  Plan:     Disposition: Follow up in ***   Signed, Aline FORBES Door, PA-C  09/18/2023 2:32 PM    [facility]

## 2023-09-23 DIAGNOSIS — N1831 Chronic kidney disease, stage 3a: Secondary | ICD-10-CM | POA: Diagnosis not present

## 2023-09-23 DIAGNOSIS — M549 Dorsalgia, unspecified: Secondary | ICD-10-CM | POA: Diagnosis not present

## 2023-09-23 DIAGNOSIS — E1140 Type 2 diabetes mellitus with diabetic neuropathy, unspecified: Secondary | ICD-10-CM | POA: Diagnosis not present

## 2023-09-23 DIAGNOSIS — S32511D Fracture of superior rim of right pubis, subsequent encounter for fracture with routine healing: Secondary | ICD-10-CM | POA: Diagnosis not present

## 2023-09-23 DIAGNOSIS — I129 Hypertensive chronic kidney disease with stage 1 through stage 4 chronic kidney disease, or unspecified chronic kidney disease: Secondary | ICD-10-CM | POA: Diagnosis not present

## 2023-09-23 DIAGNOSIS — E1122 Type 2 diabetes mellitus with diabetic chronic kidney disease: Secondary | ICD-10-CM | POA: Diagnosis not present

## 2023-09-24 DIAGNOSIS — R55 Syncope and collapse: Secondary | ICD-10-CM | POA: Diagnosis not present

## 2023-09-25 ENCOUNTER — Ambulatory Visit: Admitting: Student

## 2023-09-25 ENCOUNTER — Ambulatory Visit: Attending: Student | Admitting: Student

## 2023-09-26 ENCOUNTER — Ambulatory Visit: Payer: Self-pay | Admitting: Student

## 2023-09-26 DIAGNOSIS — R55 Syncope and collapse: Secondary | ICD-10-CM

## 2023-09-26 DIAGNOSIS — I472 Ventricular tachycardia, unspecified: Secondary | ICD-10-CM

## 2023-09-27 NOTE — Progress Notes (Signed)
 Let patient know that the heart monitor revealed short runs of abnormal heart rhythm that needs to be evaluated by cardiology.  Looks like she had an appointment with the cardiology clinic on 09/25/23 that she did not keep. I will resubmit this referral and please keep the appointment when scheduled

## 2023-10-07 ENCOUNTER — Other Ambulatory Visit: Payer: Self-pay | Admitting: Internal Medicine

## 2023-10-07 DIAGNOSIS — G8929 Other chronic pain: Secondary | ICD-10-CM

## 2023-10-15 ENCOUNTER — Telehealth: Payer: Self-pay | Admitting: Internal Medicine

## 2023-10-15 NOTE — Telephone Encounter (Signed)
 Called to confirm appt for 9/11 LVM

## 2023-10-16 ENCOUNTER — Ambulatory Visit: Attending: Internal Medicine | Admitting: Internal Medicine

## 2023-10-16 VITALS — BP 133/78 | HR 73 | Temp 97.7°F | Ht 65.0 in | Wt 190.0 lb

## 2023-10-16 DIAGNOSIS — Z79899 Other long term (current) drug therapy: Secondary | ICD-10-CM

## 2023-10-16 DIAGNOSIS — R55 Syncope and collapse: Secondary | ICD-10-CM | POA: Diagnosis not present

## 2023-10-16 DIAGNOSIS — Z23 Encounter for immunization: Secondary | ICD-10-CM | POA: Diagnosis not present

## 2023-10-16 DIAGNOSIS — K0889 Other specified disorders of teeth and supporting structures: Secondary | ICD-10-CM

## 2023-10-16 DIAGNOSIS — I10 Essential (primary) hypertension: Secondary | ICD-10-CM

## 2023-10-16 DIAGNOSIS — R918 Other nonspecific abnormal finding of lung field: Secondary | ICD-10-CM | POA: Diagnosis not present

## 2023-10-16 DIAGNOSIS — Z09 Encounter for follow-up examination after completed treatment for conditions other than malignant neoplasm: Secondary | ICD-10-CM

## 2023-10-16 DIAGNOSIS — S32501D Unspecified fracture of right pubis, subsequent encounter for fracture with routine healing: Secondary | ICD-10-CM

## 2023-10-16 DIAGNOSIS — E041 Nontoxic single thyroid nodule: Secondary | ICD-10-CM | POA: Diagnosis not present

## 2023-10-16 NOTE — Progress Notes (Unsigned)
 Patient ID: Rachel Herman, female    DOB: 06/25/1950  MRN: [id number]  CC: Hospitalization Follow-up (Hospitalization f/u. Suellen taking potassium 20 mg - not on med list/Bilateral leg muscle spasms / cramps X1 week/Yes to flu vax)   Subjective: Rachel Herman is a 73 y.o. female who presents for chronic ds management. Her concerns today include:  Pt with hx of HTN, DM, HL, DJD lumbar and spinal stenosis with hx of laminectomy 2016, obesity, depression, Ductal  CA LT s/p lumpectomy/XRT (2015 and 2016, followed by Dr. Ileana), vaginal prolapse, CKD 3, RT lung nodule (CTA 10/2018 - stable).    Discussed the use of AI scribe software for clinical note transcription with the patient, who gave verbal consent to proceed.  History of Present Illness Rachel Herman is a 73 year old female who presents for follow-up after hospitalization for a fainting spell resulting in a pelvic fracture.  She was hospitalized from July 20th to 23rd and then transferred to a rehabilitation facility until August 8th following a fainting spell at home that resulted in a pelvic fracture. No preceding symptoms such as dizziness or heart racing were noted before the fainting episode. She was carrying groceries when she fainted and fell backwards, regaining consciousness with groceries on top of her. The duration of unconsciousness is unknown.  During her hospitalization, a CT scan of the head and neck showed no abnormalities, and a CT scan of the abdomen and pelvis revealed a fracture in the right pelvis and arthritis in the right hip. An echocardiogram showed normal heart function, and a carotid ultrasound showed no significant blockage. A thyroid  nodule was noted, which was previously biopsied in 2017 and found to be benign. Lung nodules were also noted, with the largest being 7mm and stable since 2020, though some new nodules were identified.  She was discharged with instructions to follow up with a  cardiologist and wore a Zio patch for 14 days, which showed episodes of abnormal heart rhythm. She has an upcoming cardiology appointment on the 19th of this month.  She resumed hydrochlorothiazide  and clonidine  after returning home, leading to swelling in her feet and legs. She continues to take carvedilol . She uses Tylenol  and Aleve for a toothache, which causes ear and jaw pain, and has a dental appointment on the 26th. She also takes tramadol  for pain.  No further fainting episodes since the initial incident. She reports swelling in her legs. She denies using salt in her diet.  She takes calcium  and vitamin D  supplements, with vitamin D  at a dose of 800 IU, and a multivitamin. She has been monitoring her blood pressure at a local fire station, which has been reported as stable.    Patient Active Problem List   Diagnosis Date Noted   Syncope 08/25/2023   Pelvic fracture (HCC) 08/25/2023   Closed fracture of multiple pubic rami (HCC) 08/25/2023   Stage 3a chronic kidney disease (HCC) 03/01/2022   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   Pneumococcal vaccination declined 10/18/2019   Chronic cough 08/03/2018   Other intervertebral disc degeneration, lumbar region 03/18/2018   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/21/2016   Lumbar pain with radiation down left and right leg 07/02/2016   Thyroid  nodule 06/09/2015   Nodule of right lung 07/28/2014   Diverticulosis of colon without hemorrhage 07/28/2014   DJD (degenerative joint disease), lumbar 02/22/2014   Lumbar pain with radiation down left leg 01/17/2014  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 30 tablet 0   albuterol  (VENTOLIN  HFA) 108 (90 Base) MCG/ACT inhaler Inhale 2 puffs into the lungs every 6 (six) hours as needed for wheezing or shortness of breath. 8 g 3   aspirin EC 325 MG tablet Take 325 mg by mouth daily.     atorvastatin  (LIPITOR) 20 MG tablet TAKE 1 TABLET BY MOUTH DAILY. STOP PRAVASTATIN  90 tablet 1   Blood Glucose Monitoring Suppl (ACCU-CHEK GUIDE) w/Device KIT UAD to check BS once a day 1 kit 0   carvedilol  (COREG ) 12.5 MG tablet Take 1 tablet (12.5 mg total) by mouth 2 (two) times daily with a meal. 180 tablet 2   cloNIDine  (CATAPRES ) 0.1 MG tablet Take 0.1 mg by mouth 2 (two) times daily.     gabapentin  (NEURONTIN ) 300 MG capsule Take 2 capsules (600 mg total) by mouth daily. 30 capsule 0   gabapentin  (NEURONTIN ) 300 MG capsule Take 3 capsules (900 mg total) by mouth at bedtime. 30 capsule 0   glucose blood (ACCU-CHEK GUIDE TEST) test strip Use as instructed to check BS once a day 100 each 12   hydrochlorothiazide  (HYDRODIURIL ) 25 MG tablet Take 25 mg by mouth daily.     metFORMIN  (GLUCOPHAGE ) 500 MG tablet TAKE 1 & 1/2 TABLETS (750 MG) BY MOUTH 2 TIMES DAILY. 270 tablet 2   Multiple Vitamins-Minerals (MULTIVITAMIN WITH MINERALS) tablet Take 1 tablet by mouth daily. Reported on 03/13/2015     omeprazole  (PRILOSEC) 20 MG capsule Take 1 capsule (20 mg total) by mouth 2 (two) times daily before a meal. 180 capsule 1   senna (SENOKOT) 8.6 MG TABS tablet Take 1 tablet (8.6 mg total) by mouth daily. 120 tablet 0   traMADol  (ULTRAM ) 50 MG tablet TAKE 1 TABLET EVERY 4 HOURS AS NEEDED (EACH PRESCRIPTION TO LAST 1 MONTH). 120 tablet 1   methocarbamol  (ROBAXIN ) 500 MG tablet Take 500 mg by mouth 3 (three) times daily. (Patient not taking: Reported  on 10/16/2023)     polyethylene glycol (MIRALAX  / GLYCOLAX ) 17 g packet Take 17 g by mouth 2 (two) times daily. (Patient not taking: Reported on 10/16/2023) 14 each 0   No current facility-administered medications on file prior to visit.    No Known Allergies  Social History   Socioeconomic History   Marital status: Married    Spouse name: Saiya Crist    Number of children: 3   Years of education: 12   Highest education level: Not on file  Occupational History    Employer: UNEMPLOYED  Tobacco Use   Smoking status: Never   Smokeless tobacco: Never  Vaping Use   Vaping status: Never Used  Substance and Sexual Activity   Alcohol use: No   Drug use: No   Sexual activity: Not Currently    Birth control/protection: Post-menopausal  Other Topics Concern   Not on file  Social History Narrative   Married to [employer] in 1986.    Has 3 children  from previous marriage, 1 in [place], 1 in [place], 1 in prison ([place]).    Lives with husband.    Right-handed.   1 cup caffeine per day.   Social Drivers of Corporate investment banker Strain: High Risk (04/29/2023)   Overall Financial Resource Strain (CARDIA)    Difficulty of Paying Living Expenses: Very hard  Food Insecurity: No Food Insecurity (08/26/2023)   Hunger Vital Sign    Worried About Running Out of Food in the Last Year: Never true    Ran Out of Food in the Last Year: Never true  Transportation Needs: No Transportation Needs (08/26/2023)   PRAPARE - Administrator, Civil Service (Medical): No    Lack of Transportation (Non-Medical): No  Physical Activity: Sufficiently Active (04/29/2023)   Exercise Vital Sign    Days of Exercise per Week: 5 days    Minutes of Exercise per Session: 30 min  Stress: Stress Concern Present (04/29/2023)   [employer] of Occupational Health - Occupational Stress Questionnaire    Feeling of Stress : To some extent  Social Connections: Moderately Isolated  (08/26/2023)   Social Connection and Isolation Panel    Frequency of Communication with Friends and Family: Never    Frequency of Social Gatherings with Friends and Family: Never    Attends Religious Services: 1 to 4 times per year    Active Member of [employer] or Organizations: No    Attends Banker Meetings: Never    Marital Status: Living with partner  Intimate Partner Violence: Not At Risk (08/26/2023)   Humiliation, Afraid, Rape, and Kick questionnaire    Fear of Current or Ex-Partner: No    Emotionally Abused: No    Physically Abused: No    Sexually Abused: No    Family History  Problem Relation Age of Onset   Diabetes Mother    Multiple myeloma Mother 80   Hearing loss Mother    Diabetes Brother    Cancer Brother 56       prostate cancer    Diabetes Maternal Aunt    Leukemia Maternal Aunt        dx in her 55s   Alcoholism Brother    Heart attack Father    Diabetes Sister    Diabetes Son     Past Surgical History:  Procedure Laterality Date   ABDOMINAL HYSTERECTOMY N/A 09/20/2013   Procedure: HYSTERECTOMY ABDOMINAL;  Surgeon: Gloris DELENA Hugger, MD;  Location: [facility];  Service: Gynecology;  Laterality: N/A;   BREAST LUMPECTOMY Left 2015   radiation   BREAST LUMPECTOMY WITH AXILLARY LYMPH NODE BIOPSY Left 12/29/13   left breast    CATARACT EXTRACTION Right    LAPAROSCOPIC ASSISTED VAGINAL HYSTERECTOMY  09/20/2013   Procedure: LAPAROSCOPIC ASSISTED VAGINAL HYSTERECTOMY;  Surgeon: Gloris DELENA Hugger, MD;  Location: [facility];  Service: Gynecology;;  PT WAS EXAMINED WHILE UP IN [place] AND IT WAS DECIDED TO OPEN PT DUE TO LARGE MASS   LUMBAR LAMINECTOMY/DECOMPRESSION MICRODISCECTOMY Right 12/14/2014   Procedure: Right Lumbar three-four microdiskectomy;  Surgeon: Reyes Budge, MD;  Location: [facility];  Service: Neurosurgery;  Laterality: Right;  Right Lumbar three-four microdiskectomy   RADIOACTIVE SEED GUIDED PARTIAL MASTECTOMY WITH AXILLARY SENTINEL LYMPH NODE  BIOPSY Left 12/29/2013   Procedure: LEFT BREAST RADIOACTIVE SEED LOCALIZED LUMPECTOMY WITH SENTINEL LYMPH NODE MAPPING;  Surgeon: Debby Shipper, MD;  Location: [facility];  Service: General;  Laterality: Left;   RE-EXCISION OF BREAST LUMPECTOMY Left  03/02/2014   Procedure: RE-EXCISION OF LEFT BREAST LUMPECTOMY;  Surgeon: Debby Shipper, MD;  Location: [facility];  Service: General;  Laterality: Left;   SALPINGOOPHORECTOMY  09/20/2013   Procedure: SALPINGO OOPHORECTOMY;  Surgeon: Gloris DELENA Hugger, MD;  Location: [facility];  Service: Gynecology;;    ROS: Review of Systems Negative except as stated above  PHYSICAL EXAM: BP 137/84 (BP Location: Left Arm, Patient Position: Sitting, Cuff Size: Large)   Pulse 73   Temp 97.7 F (36.5 C) (Oral)   Ht 5' 5 (1.651 m)   Wt 190 lb (86.2 kg)   BMI 31.62 kg/m   Wt Readings from Last 3 Encounters:  10/16/23 190 lb (86.2 kg)  08/25/23 194 lb 7.1 oz (88.2 kg)  08/15/23 187 lb (84.8 kg)    Physical Exam  {female adult master:310786} {female adult master:310785}     Latest Ref Rng & Units 08/27/2023    5:41 AM 08/26/2023    4:09 AM 08/24/2023    7:31 PM  CMP  Glucose 70 - 99 mg/dL 881  99  856   BUN 8 - 23 mg/dL 18  15  20    Creatinine 0.44 - 1.00 mg/dL 9.10  9.16  8.94   Sodium 135 - 145 mmol/L 138  140  140   Potassium 3.5 - 5.1 mmol/L 3.7  3.4  3.5   Chloride 98 - 111 mmol/L 103  101  104   CO2 22 - 32 mmol/L 24  28  23    Calcium  8.9 - 10.3 mg/dL 9.0  9.2  9.1   Total Protein 6.5 - 8.1 g/dL   7.1   Total Bilirubin 0.0 - 1.2 mg/dL   0.9   Alkaline Phos 38 - 126 U/L   45   AST 15 - 41 U/L   26   ALT 0 - 44 U/L   13    Lipid Panel     Component Value Date/Time   CHOL 135 07/03/2022 0922   TRIG 236 (H) 07/03/2022 0922   HDL 40 07/03/2022 0922   CHOLHDL 3.4 07/03/2022 0922   CHOLHDL 5.9 09/27/2013 1053   VLDL 48 (H) 09/27/2013 1053   LDLCALC 57 07/03/2022 0922    CBC    Component Value Date/Time   WBC  9.1 08/27/2023 0541   RBC 3.85 (L) 08/27/2023 0541   HGB 11.4 (L) 08/27/2023 0541   HGB 12.4 05/05/2023 1059   HGB 12.8 06/25/2021 1154   HGB 14.5 11/08/2016 0915   HCT 35.4 (L) 08/27/2023 0541   HCT 39.9 06/25/2021 1154   HCT 44.2 11/08/2016 0915   PLT 255 08/27/2023 0541   PLT 264 05/05/2023 1059   PLT 307 06/25/2021 1154   MCV 91.9 08/27/2023 0541   MCV 86 06/25/2021 1154   MCV 85.3 11/08/2016 0915   MCH 29.6 08/27/2023 0541   MCHC 32.2 08/27/2023 0541   RDW 14.1 08/27/2023 0541   RDW 14.3 06/25/2021 1154   RDW 15.1 (H) 11/08/2016 0915   LYMPHSABS 2.4 08/27/2023 0541   LYMPHSABS 1.7 11/08/2016 0915   MONOABS 0.7 08/27/2023 0541   MONOABS 0.7 11/08/2016 0915   EOSABS 0.3 08/27/2023 0541   EOSABS 0.1 11/08/2016 0915   BASOSABS 0.0 08/27/2023 0541   BASOSABS 0.0 11/08/2016 0915    ASSESSMENT AND PLAN:  Assessment and Plan Assessment & Plan Syncope Syncope episode in July with hospital workup showing no brain abnormalities but right pubic fracture identified. Zio patch showed abnormal heart rhythm. -  Keep cardiology appointment on September 19 for further evaluation.  Right pubic fracture Right pubic fracture from July syncope episode. Routine healing observed, ongoing lower back pain reported.  Multiple right lung nodules Multiple right lung nodules identified, largest stable since 2020, new nodules present. No smoking history. - Refer to pulmonologist for evaluation.  Essential hypertension Hypertension managed with carvedilol , clonidine , and hydrochlorothiazide . Recent medication changes due to hospitalization. Reports stable blood pressure, swelling in feet and legs. - Continue carvedilol . - Hold clonidine  and hydrochlorothiazide . - Monitor blood pressure regularly.  Chronic venous insufficiency with lower extremity varicose veins and edema Reports swelling in feet and legs, exacerbated by medication changes. Varicose veins present. - Use compression socks. -  Elevate legs during the day.  Abnormal kidney function Abnormal kidney function noted with recent improvement. Advised to avoid NSAIDs. - Avoid NSAIDs, particularly ibuprofen .  Dental caries and broken teeth Severe dental caries and broken teeth causing significant pain. Dental appointment scheduled. - Keep dental appointment on September 26. - Avoid ibuprofen . - Use Tylenol  for pain management.  Benign right thyroid  nodule Right thyroid  nodule stable in size and shape, previously biopsied as benign.  General Health Maintenance Flu vaccination offered, vitamin D  supplementation discussed. - Administer flu shot. - Confirm vitamin D  dosage and content in multivitamin.     There are no diagnoses linked to this encounter.   Patient was given the opportunity to ask questions.  Patient verbalized understanding of the plan and was able to repeat key elements of the plan.   This documentation was completed using Paediatric nurse.  Any transcriptional errors are unintentional.  No orders of the defined types were placed in this encounter.    Requested Prescriptions    No prescriptions requested or ordered in this encounter    No follow-ups on file.  Barnie Louder, MD, FACP

## 2023-10-16 NOTE — Patient Instructions (Signed)
 VISIT SUMMARY:  During your visit, we reviewed your recent hospitalization and rehabilitation following a fainting spell that resulted in a pelvic fracture. We discussed your current medications, symptoms, and upcoming appointments. We also addressed your blood pressure management, swelling in your legs, and dental pain.  YOUR PLAN:  -SYNCOPE: Syncope is a temporary loss of consciousness, often referred to as fainting. You experienced a fainting episode in July, and your Zio patch showed abnormal heart rhythms. Please keep your cardiology appointment on September 19 for further evaluation.  -RIGHT PUBIC FRACTURE: A right pubic fracture is a break in the pelvic bone. Your fracture from the July incident is healing routinely, but you are experiencing ongoing lower back pain.  -MULTIPLE RIGHT LUNG NODULES: Lung nodules are small growths in the lungs. You have multiple nodules, with the largest being stable since 2020, but some new ones have appeared. We will refer you to a pulmonologist for further evaluation.  -ESSENTIAL HYPERTENSION: Essential hypertension is high blood pressure with no identifiable cause. Your blood pressure is being managed with carvedilol , clonidine , and hydrochlorothiazide . Due to recent swelling in your feet and legs, we will continue carvedilol  but hold clonidine  and hydrochlorothiazide . Please monitor your blood pressure regularly.  -CHRONIC VENOUS INSUFFICIENCY WITH LOWER EXTREMITY VARICOSE VEINS AND EDEMA: Chronic venous insufficiency is a condition where the veins in your legs have trouble sending blood back to your heart, causing swelling and varicose veins. To manage this, use compression socks and elevate your legs during the day.  -ABNORMAL KIDNEY FUNCTION: Abnormal kidney function means your kidneys are not working as well as they should. Your kidney function has recently improved. Avoid NSAIDs, especially ibuprofen , to prevent further kidney issues.  -DENTAL CARIES  AND BROKEN TEETH: Dental caries are cavities, and you have severe cavities and broken teeth causing pain. You have a dental appointment scheduled for September 26. Avoid ibuprofen  and use Tylenol  for pain management.  -BENIGN RIGHT THYROID  NODULE: A benign thyroid  nodule is a non-cancerous growth in the thyroid  gland. Your right thyroid  nodule is stable in size and shape and was previously biopsied as benign.  -GENERAL HEALTH MAINTENANCE: We discussed your vitamin D  supplementation and offered a flu vaccination. We confirmed your vitamin D  dosage and content in your multivitamin, and administered the flu shot.  INSTRUCTIONS:  Please keep your cardiology appointment on September 19 and your dental appointment on September 26. Monitor your blood pressure regularly and avoid NSAIDs, especially ibuprofen . Use compression socks and elevate your legs during the day to manage swelling. Continue taking your vitamin D  supplements as discussed.

## 2023-10-18 ENCOUNTER — Encounter: Payer: Self-pay | Admitting: Internal Medicine

## 2023-10-19 NOTE — Progress Notes (Deleted)
 Cardiology Office Note:    Date:  10/19/2023   ID:  ALEXEA BLASE, DOB 11-May-1950, MRN [id number]  PCP:  Vicci Barnie NOVAK, MD   [facility] Providers Cardiologist:  None { Click to update primary MD,subspecialty MD or APP then REFRESH:1}    Referring MD: Vicci Barnie NOVAK, MD   No chief complaint on file. ***  History of Present Illness:    Auriah K Livengood is a 73 y.o. female with a hx of hypertension, hyperlipidemia, DM 2, and CKD 3A.  She was evaluated after an episode of syncope and completed a heart monitor that showed SVT with the longest episode lasting 23.5 seconds.  A total of 106 SVT episodes were recorded.  She presents today to establish with cardiology.    Medications 325 mg aspirin?     SVT - Heart monitor with 106 episodes of SVT with the longest episode 23.5 seconds -Unclear if this was the etiology of her syncope - She is already on 12.5 mg carvedilol  twice daily - I will increase carvedilol  to 25 mg twice daily - Will likely need to reduce her other antihypertensives, as below   Obesity -Screen for sleep apnea   Hypertension - 0.1 mg clonidine  twice daily, 25 mg HCTZ, 12.5 mg carvedilol  twice daily - Blood pressure today   Hyperlipidemia with LDL less than 70 -She is on 20 mg Lipitor - I do not see a recent lipid panel - Given diabetes, could consider LDL less than 70    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   Diabetes mellitus (HCC) 09/16/13   Diagnosed on 09/16/13; HgA1C was 7.2/metformin    Dizziness    GERD (gastroesophageal reflux disease)    Headache    Hyperlipidemia    Hypertension 2014   Low back pain    Obesity, morbid, BMI 40.0-49.9 (HCC)    PONV (postoperative nausea and vomiting)    S/P radiation therapy 04/05/14-05/20/14   left breast 60.4Gy totaldose    Past Surgical History:  Procedure Laterality Date   ABDOMINAL HYSTERECTOMY  N/A 09/20/2013   Procedure: HYSTERECTOMY ABDOMINAL;  Surgeon: Gloris DELENA Hugger, MD;  Location: [facility];  Service: Gynecology;  Laterality: N/A;   BREAST LUMPECTOMY Left 2015   radiation   BREAST LUMPECTOMY WITH AXILLARY LYMPH NODE BIOPSY Left 12/29/13   left breast    CATARACT EXTRACTION Right    LAPAROSCOPIC ASSISTED VAGINAL HYSTERECTOMY  09/20/2013   Procedure: LAPAROSCOPIC ASSISTED VAGINAL HYSTERECTOMY;  Surgeon: Gloris DELENA Hugger, MD;  Location: [facility];  Service: Gynecology;;  PT WAS EXAMINED WHILE UP IN [place] AND IT WAS DECIDED TO OPEN PT DUE TO LARGE MASS   LUMBAR LAMINECTOMY/DECOMPRESSION MICRODISCECTOMY Right 12/14/2014   Procedure: Right Lumbar three-four microdiskectomy;  Surgeon: Reyes Budge, MD;  Location: [facility];  Service: Neurosurgery;  Laterality: Right;  Right Lumbar three-four microdiskectomy   RADIOACTIVE SEED GUIDED PARTIAL MASTECTOMY WITH AXILLARY SENTINEL LYMPH NODE BIOPSY Left 12/29/2013   Procedure: LEFT BREAST RADIOACTIVE SEED LOCALIZED LUMPECTOMY WITH SENTINEL LYMPH NODE MAPPING;  Surgeon: Debby Shipper, MD;  Location: [facility];  Service: General;  Laterality: Left;   RE-EXCISION OF BREAST LUMPECTOMY Left 03/02/2014   Procedure: RE-EXCISION OF LEFT BREAST LUMPECTOMY;  Surgeon: Debby Shipper, MD;  Location: [facility];  Service: General;  Laterality: Left;   SALPINGOOPHORECTOMY  09/20/2013   Procedure: SALPINGO OOPHORECTOMY;  Surgeon: Gloris DELENA Hugger, MD;  Location: [facility];  Service: Gynecology;;    Current Medications: No outpatient medications have been marked as taking for the 10/21/23 encounter (Appointment) with Madie Jon Garre, PA.     Allergies:   Patient has no known allergies.   Social History   Socioeconomic History   Marital status: Married    Spouse name: Dulcemaria Bula    Number of children: 3   Years of education: 12   Highest education level: Not on file  Occupational History    Employer: UNEMPLOYED   Tobacco Use   Smoking status: Never   Smokeless tobacco: Never  Vaping Use   Vaping status: Never Used  Substance and Sexual Activity   Alcohol use: No   Drug use: No   Sexual activity: Not Currently    Birth control/protection: Post-menopausal  Other Topics Concern   Not on file  Social History Narrative   Married to [employer] in 1986.    Has 3 children from previous marriage, 1 in [place], 1 in [place], 1 in prison ([place]).    Lives with husband.    Right-handed.   1 cup caffeine per day.   Social Drivers of Corporate investment banker Strain: High Risk (04/29/2023)   Overall Financial Resource Strain (CARDIA)    Difficulty of Paying Living Expenses: Very hard  Food Insecurity: No Food Insecurity (08/26/2023)   Hunger Vital Sign    Worried About Running Out of Food in the Last Year: Never true    Ran Out of Food in the Last Year: Never true  Transportation Needs: No Transportation Needs (08/26/2023)   PRAPARE - Administrator, Civil Service (Medical): No    Lack of Transportation (Non-Medical): No  Physical Activity: Sufficiently Active (04/29/2023)   Exercise Vital Sign    Days of Exercise per Week: 5 days    Minutes of Exercise per Session: 30 min  Stress: Stress Concern Present (04/29/2023)   [employer] of Occupational Health - Occupational Stress Questionnaire    Feeling of Stress : To some extent  Social Connections: Moderately Isolated (08/26/2023)   Social Connection and Isolation Panel    Frequency of Communication with Friends and Family: Never    Frequency of Social Gatherings with Friends and Family: Never    Attends Religious Services: 1 to 4 times per year    Active Member of [employer] or Organizations: No    Attends Engineer, structural: Never    Marital Status: Living with partner     Family History: The patient's ***family history includes Alcoholism in her brother; Cancer (age of onset: 51) in her brother; Diabetes  in her brother, maternal aunt, mother, sister, and son; Hearing loss in her mother; Heart attack in her father; Leukemia in her maternal aunt; Multiple myeloma (age of onset: 57) in her mother.  ROS:   Please see the history of present illness.    *** All other systems reviewed and are negative.  EKGs/Labs/Other Studies Reviewed:    The following studies were reviewed today: ***      Recent Labs: 08/24/2023: ALT 13 08/26/2023: B Natriuretic Peptide 86.3; Magnesium  1.7; TSH 1.385 08/27/2023: BUN 18; Creatinine, Ser 0.89; Hemoglobin 11.4; Platelets 255; Potassium 3.7; Sodium 138  Recent Lipid Panel    Component Value Date/Time   CHOL 135 07/03/2022 0922   TRIG 236 (H) 07/03/2022 0922   HDL 40 07/03/2022 0922   CHOLHDL 3.4 07/03/2022 0922   CHOLHDL 5.9 09/27/2013 1053   VLDL 48 (H)  09/27/2013 1053   LDLCALC 57 07/03/2022 0922     Risk Assessment/Calculations:   {Does this patient have ATRIAL FIBRILLATION?[phone]}  No BP recorded.  {Refresh Note OR Click here to enter BP  :1}***         Physical Exam:    VS:  There were no vitals taken for this visit.    Wt Readings from Last 3 Encounters:  10/16/23 190 lb (86.2 kg)  08/25/23 194 lb 7.1 oz (88.2 kg)  08/15/23 187 lb (84.8 kg)     GEN: *** Well nourished, well developed in no acute distress HEENT: Normal NECK: No JVD; No carotid bruits LYMPHATICS: No lymphadenopathy CARDIAC: ***RRR, no murmurs, rubs, gallops RESPIRATORY:  Clear to auscultation without rales, wheezing or rhonchi  ABDOMEN: Soft, non-tender, non-distended MUSCULOSKELETAL:  No edema; No deformity  SKIN: Warm and dry NEUROLOGIC:  Alert and oriented x 3 PSYCHIATRIC:  Normal affect   ASSESSMENT:    No diagnosis found. PLAN:    In order of problems listed above:  ***      {Are you ordering a CV Procedure (e.g. stress test, cath, DCCV, TEE, etc)?   Press F2        :[id number]}    Medication Adjustments/Labs and Tests Ordered: Current  medicines are reviewed at length with the patient today.  Concerns regarding medicines are outlined above.  No orders of the defined types were placed in this encounter.  No orders of the defined types were placed in this encounter.   There are no Patient Instructions on file for this visit.   Signed, Jon Nat Hails, [place]  10/19/2023 4:42 PM    [facility]

## 2023-10-21 ENCOUNTER — Ambulatory Visit: Admitting: Physician Assistant

## 2023-10-22 ENCOUNTER — Telehealth: Payer: Self-pay

## 2023-10-22 NOTE — Progress Notes (Unsigned)
 Cardiology Office Note    Date:  10/23/2023  ID:  Rachel Herman, DOB [birth year], MRN [id number] PCP:  Vicci Barnie NOVAK, MD  Cardiologist:  None - New  Chief Complaint: f/u syncope  History of Present Illness: .    Rachel Herman is a 73 y.o. female with visit-pertinent history of lung nodules, thyroid  nodule, HTN, DM2 with neuropathy. CKD 3a, GERD, HLD, chronic pain, lumbar radiculopathy, neuropathy, breast CA, obesity, seen for evaluation of abnormal monitor at the request of Dr. Vicci.   She was recently admitted 08/2023 with syncope and resultant fall with resultant pelvic fracture and possible concussion. She was in line at the grocery store with a box of groceries and the next thing she knew she was on the ground. The LOC was very brief. No known seizure activity, b/b incontinence. She had had a similar episode 1 year prior while working outside for which she did not seek care. No preceding CP, palpitations, SOB, warning symptoms. In the 08/2023 admission, presenting BP 140s/80s. Carotid duplex showed mild 1-39% RICA. LE duplex neg for DVT. CTA showed no central PE, mild cardiomegaly with small anterior pericardial effusion, aortic atherosclerosis, + multiple lung nodules (some chronic, some new), small hiatal hernia, thyroid  nodule stable compared to 2020. CT head without acute intracranial finding. 2D echo showed EF 60-65%, G1DD, normal RV, aortic sclerosis without stenosis, no pericardial effusion. Orthostatics were reported as not performed though patient stated they were done, and there is a record from 7/25 with unremarkable orthostatics though this was at time of discharge. Clonidine  and hydrochlorothiazide  were held as a precaution. Labs showed mild progressive anemia with Hgb 11's. Monitor report showed NSR avg 80bpm, range 55-203bpm, 2 NSVT (4 beats), 106 PSVT (longest 23.5 sec), rare PACs/PVCs.  She is seen with her fiance. She reports that after completion of the monitor  she self-restarted carvedilol  and clonidine  due to rising blood pressure and has felt fine since then. No CP, SOB, palpitations, orthopnea, recurrent syncope.   Orthostatic VS Lying BP 110/71 HR 66 Sitting BP 120/80 HR 70 Standing 47m BP 119/78 HR 76 Standing 9m BP 122/81 HR 78    Family History: father had MI age 18 Tobacco: none Alcohol: none  Drug use: none   Labwork independently reviewed: 08/2023 K 3.7, Cr 0.89, Hgb 11.4, plt 255, Mg 1.7, TSH wnl, BNP wnl, trops neg  ROS: .    Please see the history of present illness.  All other systems are reviewed and otherwise negative.  Studies Reviewed: SABRA    EKG:  EKG is ordered today, personally reviewed, demonstrating  EKG Interpretation Date/Time:  Thursday October 23 2023 14:03:54 EDT Ventricular Rate:  72 PR Interval:  170 QRS Duration:  76 QT Interval:  388 QTC Calculation: 424 R Axis:   23  Text Interpretation: Normal sinus rhythm Normal ECG Confirmed by Prabhjot Maddux [phone]) on 10/23/2023 2:13:30 PM         CV Studies: Cardiac studies reviewed are outlined and summarized above. Otherwise please see EMR for full report.   Current Reported Medications:.    Current Meds  Medication Sig   Accu-Chek Softclix Lancets lancets Use as instructed   acetaminophen  (TYLENOL ) 500 MG tablet Take 2 tablets (1,000 mg total) by mouth 3 (three) times daily.   albuterol  (VENTOLIN  HFA) 108 (90 Base) MCG/ACT inhaler Inhale 2 puffs into the lungs every 6 (six) hours as needed for wheezing or shortness of breath.   aspirin EC 325 MG tablet  Take 325 mg by mouth daily.   atorvastatin  (LIPITOR) 20 MG tablet TAKE 1 TABLET BY MOUTH DAILY. STOP PRAVASTATIN    Blood Glucose Monitoring Suppl (ACCU-CHEK GUIDE) w/Device KIT UAD to check BS once a day   carvedilol  (COREG ) 12.5 MG tablet Take 1 tablet (12.5 mg total) by mouth 2 (two) times daily with a meal.   cloNIDine  (CATAPRES ) 0.1 MG tablet Take 0.1 mg by mouth 2 (two) times daily.    gabapentin  (NEURONTIN ) 300 MG capsule Take 2 capsules (600 mg total) by mouth daily.   gabapentin  (NEURONTIN ) 300 MG capsule Take 3 capsules (900 mg total) by mouth at bedtime.   glucose blood (ACCU-CHEK GUIDE TEST) test strip Use as instructed to check BS once a day   hydrochlorothiazide  (HYDRODIURIL ) 25 MG tablet Take 25 mg by mouth daily.   metFORMIN  (GLUCOPHAGE ) 500 MG tablet TAKE 1 & 1/2 TABLETS (750 MG) BY MOUTH 2 TIMES DAILY.   Multiple Vitamins-Minerals (MULTIVITAMIN WITH MINERALS) tablet Take 1 tablet by mouth daily. Reported on 03/13/2015   omeprazole  (PRILOSEC) 20 MG capsule Take 1 capsule (20 mg total) by mouth 2 (two) times daily before a meal.   polyethylene glycol (MIRALAX  / GLYCOLAX ) 17 g packet Take 17 g by mouth 2 (two) times daily.   senna (SENOKOT) 8.6 MG TABS tablet Take 1 tablet (8.6 mg total) by mouth daily.   traMADol  (ULTRAM ) 50 MG tablet TAKE 1 TABLET EVERY 4 HOURS AS NEEDED (EACH PRESCRIPTION TO LAST 1 MONTH).    Physical Exam:    VS:  BP 110/71 (BP Location: Left Arm, Patient Position: Sitting, Cuff Size: Normal)   Pulse 72   Ht 5' 5 (1.651 m)   Wt 195 lb (88.5 kg)   BMI 32.45 kg/m    Wt Readings from Last 3 Encounters:  10/23/23 195 lb (88.5 kg)  10/16/23 190 lb (86.2 kg)  08/25/23 194 lb 7.1 oz (88.2 kg)    GEN: Well nourished, well developed in no acute distress NECK: No JVD; No carotid bruits CARDIAC: RRR, no murmurs, rubs, gallops RESPIRATORY:  Clear to auscultation without rales, wheezing or rhonchi  ABDOMEN: Soft, non-tender, non-distended EXTREMITIES:  No edema; No acute deformity   Asessement and Plan:.    1. Recurrent syncope, hx PSVT/NSVT on monitor - first episode of syncope happened 1 year prior while outside, did not need to seek care at that time. 2nd episode happened while in line at the grocery store. Both episodes without prodrome. She broke her pelvis with the second episode. Echocardiogram and hospital workup generally unrevealing.  Orthostatics were not performed on admission, but were normal at discharge and remain unremarkable today. Her monitor showed brief episodes of PSVT and rare brief NSVT, but I would not expect these to cause syncope in the absence of structural heart disease otherwise (ie LV dysfunction, baseline hypotension, HCM). She actually has self-restarted carvedilol  and clonidine  since the monitor was performed and denies any symptomatic palpitations or recurrent syncope/near-syncope. However, I remain concerned about the fact that she has had 2 episodes of syncope without prodrome now, and no clear explanation. I have recommended referral to EP to discuss ILR. Will also update H/H today (given slight downtrend of Hgb in hospital), repeat BMET/Mg given monitor findings. Will also refer to neurology for completeness to exclude potential non-cardiac etiologies of LOC. We also discussed [place] DMV recommendation of no driving x 6 months from last episode of syncope.  2. Aortic atherosclerosis - noted on imaging. Will defer lipid management  to primary care. No anginal symptoms reported.  3. Essential HTN - controlled, no changes today.    Disposition: Refer to EP to consider ILR.  Signed, Ariba Lehnen N Joliet Mallozzi, PA-C

## 2023-10-22 NOTE — Telephone Encounter (Signed)
 Called and spoke to the patient. Verified name & DOB. Informed that on her last visit she forgot to stop at the lab to have blood work done to check vit D level. Patient stated that she wants to come in the same day as her appointment with [facility] on [facility]. Informed patient she does not have any upcoming appointment and missed on on 10/21/2023. Patient will call them to reschedule and will call back to schedule a lab visit.

## 2023-10-23 ENCOUNTER — Ambulatory Visit: Attending: Physician Assistant | Admitting: Physician Assistant

## 2023-10-23 ENCOUNTER — Encounter: Payer: Self-pay | Admitting: Physician Assistant

## 2023-10-23 ENCOUNTER — Ambulatory Visit: Attending: Family Medicine

## 2023-10-23 VITALS — BP 110/71 | HR 72 | Ht 65.0 in | Wt 195.0 lb

## 2023-10-23 DIAGNOSIS — I4729 Other ventricular tachycardia: Secondary | ICD-10-CM

## 2023-10-23 DIAGNOSIS — R55 Syncope and collapse: Secondary | ICD-10-CM

## 2023-10-23 DIAGNOSIS — I471 Supraventricular tachycardia, unspecified: Secondary | ICD-10-CM

## 2023-10-23 DIAGNOSIS — I10 Essential (primary) hypertension: Secondary | ICD-10-CM

## 2023-10-23 DIAGNOSIS — S32501D Unspecified fracture of right pubis, subsequent encounter for fracture with routine healing: Secondary | ICD-10-CM | POA: Diagnosis not present

## 2023-10-23 DIAGNOSIS — I700 Atherosclerosis of aorta: Secondary | ICD-10-CM

## 2023-10-23 NOTE — Patient Instructions (Signed)
 Medication Instructions:  Your physician recommends that you continue on your current medications as directed. Please refer to the Current Medication list given to you today.  *If you need a refill on your cardiac medications before your next appointment, please call your pharmacy*  Lab Work: TODAY:  BMET, MAG, & H&H  If you have labs (blood work) drawn today and your tests are completely normal, you will receive your results only by: MyChart Message (if you have MyChart) OR A paper copy in the mail If you have any lab test that is abnormal or we need to change your treatment, we will call you to review the results.  Testing/Procedures: None ordered  You have been referred to EP to discuss loop recorder  You have been referred to Neurology   Follow-Up: At [facility], you and your health needs are our priority.  As part of our continuing mission to provide you with exceptional heart care, our providers are all part of one team.  This team includes your primary Cardiologist (physician) and Advanced Practice Providers or APPs (Physician Assistants and Nurse Practitioners) who all work together to provide you with the care you need, when you need it.  Your next appointment:     Provider:   EP     We recommend signing up for the patient portal called MyChart.  Sign up information is provided on this After Visit Summary.  MyChart is used to connect with patients for Virtual Visits (Telemedicine).  Patients are able to view lab/test results, encounter notes, upcoming appointments, etc.  Non-urgent messages can be sent to your provider as well.   To learn more about what you can do with MyChart, go to [email or web address].   Other Instructions

## 2023-10-24 ENCOUNTER — Ambulatory Visit: Payer: Self-pay | Admitting: Physician Assistant

## 2023-10-24 DIAGNOSIS — Z79899 Other long term (current) drug therapy: Secondary | ICD-10-CM

## 2023-10-24 LAB — BASIC METABOLIC PANEL WITH GFR
BUN/Creatinine Ratio: 23 (ref 12–28)
BUN: 28 mg/dL — AB (ref 8–27)
CO2: 22 mmol/L (ref 20–29)
Calcium: 9.6 mg/dL (ref 8.7–10.3)
Chloride: 103 mmol/L (ref 96–106)
Creatinine, Ser: 1.22 mg/dL — AB (ref 0.57–1.00)
Glucose: 97 mg/dL (ref 70–99)
Potassium: 4.1 mmol/L (ref 3.5–5.2)
Sodium: 144 mmol/L (ref 134–144)
eGFR: 47 mL/min/1.73 — AB (ref >59)

## 2023-10-24 LAB — VITAMIN D 25 HYDROXY (VIT D DEFICIENCY, FRACTURES): Vit D, 25-Hydroxy: 41.6 ng/mL (ref 30.0–100.0)

## 2023-10-24 LAB — HEMOGLOBIN AND HEMATOCRIT, BLOOD
Hematocrit: 37.8 % (ref 34.0–46.6)
Hemoglobin: 12 g/dL (ref 11.1–15.9)

## 2023-10-24 LAB — MAGNESIUM: Magnesium: 1.6 mg/dL (ref 1.6–2.3)

## 2023-10-24 MED ORDER — MAGNESIUM OXIDE 400 MG PO TABS: 400.0000 mg | ORAL_TABLET | Freq: Every day | ORAL | 32 refills | Status: AC

## 2023-10-25 ENCOUNTER — Ambulatory Visit: Payer: Self-pay | Admitting: Internal Medicine

## 2023-10-31 ENCOUNTER — Other Ambulatory Visit: Payer: Self-pay

## 2023-11-11 ENCOUNTER — Ambulatory Visit: Admitting: Pulmonary Disease

## 2023-11-11 ENCOUNTER — Encounter: Payer: Self-pay | Admitting: Pulmonary Disease

## 2023-11-11 VITALS — BP 138/78 | HR 71 | Ht 65.0 in | Wt 193.0 lb

## 2023-11-11 DIAGNOSIS — R918 Other nonspecific abnormal finding of lung field: Secondary | ICD-10-CM | POA: Diagnosis not present

## 2023-11-11 DIAGNOSIS — Z800 Family history of malignant neoplasm of digestive organs: Secondary | ICD-10-CM | POA: Insufficient documentation

## 2023-11-11 DIAGNOSIS — E119 Type 2 diabetes mellitus without complications: Secondary | ICD-10-CM | POA: Insufficient documentation

## 2023-11-11 DIAGNOSIS — Z860100 Personal history of colon polyps, unspecified: Secondary | ICD-10-CM | POA: Insufficient documentation

## 2023-11-11 DIAGNOSIS — K219 Gastro-esophageal reflux disease without esophagitis: Secondary | ICD-10-CM | POA: Insufficient documentation

## 2023-11-11 NOTE — Progress Notes (Unsigned)
 Subjective:    Patient ID: Rachel Herman, female    DOB: [birth year], 73 y.o.   MRN: [id number]  Patient Care Team: Vicci Barnie NOVAK, MD as PCP - General (Internal Medicine) Funches, Josalyn, MD as Consulting Physician (Family Medicine) Vanderbilt Ned, MD as Consulting Physician (General Surgery) Lanny Callander, MD as Consulting Physician (Hematology) Dewey Rush, MD as Consulting Physician (Radiation Oncology) Octavia Charlie Hamilton, MD as Consulting Physician (Ophthalmology)  Chief Complaint  Patient presents with   Establish Care    BACKGROUND: Patient is a 73 year old lifelong never smoker with a history as noted below who presents for evaluation of lung nodules.  She is referred by Dr. Barnie Vicci.  Previously seen at [facility] by Dr. Ozell America in 2018 and subsequently by Dr. Belvie Silvan in 2020 (albeit for general medical issues) with no follow-up after that.   HPI Discussed the use of AI scribe software for clinical note transcription with the patient, who gave verbal consent to proceed.  History of Present Illness   Rachel Herman is a 73 year old female who presents with lung nodules. She was referred by Dr. Vicci for evaluation of lung nodules.  She has a cough that occurs with seasonal changes. No history of asthma. In July, she underwent a scan while hospitalized for evaluation of blood clots, which were not found. However, the scan revealed small lung nodules. These nodules are described as tiny, and the patient recalls being told that one has been present for several years.  She recalls being admitted to the hospital after an incident on the 19th when she fell backwards and required assistance from paramedics. She experienced worsening pain and was found to have fractures. She suspects she may have injured her back during the fall.  She is a lifelong never smoker.  She does not endorse any issues with dyspnea on exertion.  No cough or sputum  production.  No prior history of asthma.  No history of pulmonary disease in the family.     Review of Systems A 10 point review of systems was performed and it is as noted above otherwise negative.   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   Diabetes mellitus (HCC) 09/16/13   Diagnosed on 09/16/13; HgA1C was 7.2/metformin    Dizziness    GERD (gastroesophageal reflux disease)    Headache    Hyperlipidemia    Hypertension 2014   Low back pain    Obesity, morbid, BMI 40.0-49.9 (HCC)    PONV (postoperative nausea and vomiting)    S/P radiation therapy 04/05/14-05/20/14   left breast 60.4Gy totaldose    Past Surgical History:  Procedure Laterality Date   ABDOMINAL HYSTERECTOMY N/A 09/20/2013   Procedure: HYSTERECTOMY ABDOMINAL;  Surgeon: Gloris DELENA Hugger, MD;  Location: [facility];  Service: Gynecology;  Laterality: N/A;   BREAST LUMPECTOMY Left 2015   radiation   BREAST LUMPECTOMY WITH AXILLARY LYMPH NODE BIOPSY Left 12/29/13   left breast    CATARACT EXTRACTION Right    LAPAROSCOPIC ASSISTED VAGINAL HYSTERECTOMY  09/20/2013   Procedure: LAPAROSCOPIC ASSISTED VAGINAL HYSTERECTOMY;  Surgeon: Gloris DELENA Hugger, MD;  Location: [facility];  Service: Gynecology;;  PT WAS EXAMINED WHILE UP IN [place] AND IT WAS DECIDED TO OPEN PT DUE TO LARGE MASS   LUMBAR LAMINECTOMY/DECOMPRESSION MICRODISCECTOMY Right 12/14/2014   Procedure: Right Lumbar three-four microdiskectomy;  Surgeon: Reyes Budge, MD;  Location: [facility];  Service: Neurosurgery;  Laterality: Right;  Right Lumbar three-four microdiskectomy   RADIOACTIVE SEED GUIDED PARTIAL MASTECTOMY WITH AXILLARY SENTINEL LYMPH NODE BIOPSY Left 12/29/2013   Procedure: LEFT BREAST RADIOACTIVE SEED LOCALIZED LUMPECTOMY WITH SENTINEL LYMPH NODE MAPPING;  Surgeon: Debby Shipper, MD;  Location: [facility];  Service: General;  Laterality: Left;   RE-EXCISION OF BREAST  LUMPECTOMY Left 03/02/2014   Procedure: RE-EXCISION OF LEFT BREAST LUMPECTOMY;  Surgeon: Debby Shipper, MD;  Location: [facility];  Service: General;  Laterality: Left;   SALPINGOOPHORECTOMY  09/20/2013   Procedure: SALPINGO OOPHORECTOMY;  Surgeon: Gloris DELENA Hugger, MD;  Location: [facility];  Service: Gynecology;;    Patient Active Problem List   Diagnosis Date Noted   Family history of malignant neoplasm of colon 11/11/2023   Gastro-esophageal reflux disease without esophagitis 11/11/2023   History of colonic polyps 11/11/2023   Well controlled type 2 diabetes mellitus (HCC) 11/11/2023   Syncope 08/25/2023   Pelvic fracture (HCC) 08/25/2023   Closed fracture of multiple pubic rami (HCC) 08/25/2023   Stage 3a chronic kidney disease (HCC) 03/01/2022   Pneumococcal vaccination declined 10/18/2019   Chronic cough 08/03/2018   Other intervertebral disc degeneration, lumbar region 03/18/2018   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   Abdominal distension 10/23/2016   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/21/2016   Lumbar pain with radiation down left and right leg 07/02/2016   Thyroid  nodule 06/09/2015   Nodule of right lung 07/28/2014   Diverticulosis of colon without hemorrhage 07/28/2014   DJD (degenerative joint disease), lumbar 02/22/2014   Lumbar pain with radiation down left leg 01/17/2014   Breast cancer of upper-inner quadrant of left female breast (HCC) 01/05/2014   Hyperlipidemia associated with type 2 diabetes mellitus (HCC) 09/28/2013   S/P total hysterectomy and bilateral salpingo-oophorectomy 09/20/2013   Essential hypertension    Diabetes mellitus (HCC) 09/16/2013    Family History  Problem Relation Age of Onset   Diabetes Mother    Multiple myeloma Mother 70   Hearing loss Mother    Diabetes Brother    Cancer Brother 40       prostate cancer    Diabetes Maternal Aunt    Leukemia Maternal Aunt        dx in her 47s   Alcoholism  Brother    Heart attack Father    Diabetes Sister    Diabetes Son     Social History   Tobacco Use   Smoking status: Never   Smokeless tobacco: Never  Substance Use Topics   Alcohol use: No    No Known Allergies  Current Meds  Medication Sig   Accu-Chek Softclix Lancets lancets Use as instructed   acetaminophen  (TYLENOL ) 500 MG tablet Take 2 tablets (1,000 mg total) by mouth 3 (three) times daily.   albuterol  (VENTOLIN  HFA) 108 (90 Base) MCG/ACT inhaler Inhale 2 puffs into the lungs every 6 (six) hours as needed for wheezing or shortness of breath.   aspirin EC 325 MG tablet Take 325 mg by mouth daily.   atorvastatin  (LIPITOR) 20 MG tablet TAKE 1 TABLET BY MOUTH DAILY. STOP PRAVASTATIN    Blood Glucose Monitoring Suppl (ACCU-CHEK GUIDE) w/Device KIT UAD to check BS once a day   carvedilol  (COREG ) 12.5 MG tablet Take 1 tablet (12.5 mg total) by mouth 2 (two) times daily with a meal.   cloNIDine  (CATAPRES ) 0.1 MG tablet Take 0.1 mg by mouth 2 (two) times daily.   gabapentin  (NEURONTIN ) 300 MG  capsule Take 2 capsules (600 mg total) by mouth daily.   gabapentin  (NEURONTIN ) 300 MG capsule Take 3 capsules (900 mg total) by mouth at bedtime.   glucose blood (ACCU-CHEK GUIDE TEST) test strip Use as instructed to check BS once a day   hydrochlorothiazide  (HYDRODIURIL ) 25 MG tablet Take 25 mg by mouth daily.   magnesium  oxide (MAG-OX) 400 MG tablet Take 1 tablet (400 mg total) by mouth daily.   metFORMIN  (GLUCOPHAGE ) 500 MG tablet TAKE 1 & 1/2 TABLETS (750 MG) BY MOUTH 2 TIMES DAILY.   Multiple Vitamins-Minerals (MULTIVITAMIN WITH MINERALS) tablet Take 1 tablet by mouth daily. Reported on 03/13/2015   omeprazole  (PRILOSEC) 20 MG capsule Take 1 capsule (20 mg total) by mouth 2 (two) times daily before a meal.   senna (SENOKOT) 8.6 MG TABS tablet Take 1 tablet (8.6 mg total) by mouth daily.   traMADol  (ULTRAM ) 50 MG tablet TAKE 1 TABLET EVERY 4 HOURS AS NEEDED (EACH PRESCRIPTION TO LAST 1  MONTH).    Immunization History  Administered Date(s) Administered   Fluad Quad(high Dose 65+) 10/22/2018, 10/29/2021   Fluad Trivalent(High Dose 65+) 11/07/2022   INFLUENZA, HIGH DOSE SEASONAL PF 10/22/2018   Influenza, Seasonal, Injecte, Preservative Fre 10/16/2023   Influenza,inj,Quad PF,6+ Mos 12/26/2015, 11/01/2016, 10/23/2017, 10/18/2019, 10/17/2020   Influenza-Unspecified 12/26/2015, 11/01/2016, 10/23/2017   PFIZER(Purple Top)SARS-COV-2 Vaccination 04/05/2019, 04/28/2019, 02/15/2020   Tdap 03/30/2014        Objective:     BP 138/78   Pulse 71   Ht 5' 5 (1.651 m)   Wt 193 lb (87.5 kg)   SpO2 95%   BMI 32.12 kg/m   SpO2: 95 %  GENERAL: Obese woman, no acute distress, fully ambulatory.  No conversational dyspnea. HEAD: Normocephalic, atraumatic.  EYES: Pupils equal, round, reactive to light.  No scleral icterus.  MOUTH: Poor dentition, missing teeth, oral mucosa moist.  No thrush. NECK: Supple. No thyromegaly. Trachea midline. No JVD.  No adenopathy. PULMONARY: Good air entry bilaterally.  No adventitious sounds. CARDIOVASCULAR: S1 and S2. Regular rate and rhythm.  No rubs, murmurs or gallops heard. ABDOMEN: Obese, otherwise benign. MUSCULOSKELETAL: No joint deformity, no clubbing, no edema.  NEUROLOGIC: No overt focal deficit, no gait disturbance, speech is fluent. SKIN: Intact,warm,dry. PSYCH: Mood and behavior normal.  CT angio chest of 26 August 2023 independently reviewed with multiple lung nodules some new some chronic and unchanged.  Nodules too numerous to list.      Assessment & Plan:     ICD-10-CM   1. Multiple lung nodules on CT  R91.8 CT CHEST WO CONTRAST     Orders Placed This Encounter  Procedures   CT CHEST WO CONTRAST    Standing Status:   Future    Expected Date:   08/26/2024    Expiration Date:   11/24/2024    Preferred imaging location?:   [facility]   Discussion:    Pulmonary nodules Lung nodules identified on a scan in July  during evaluation for thromboembolism. Nodules are small, stable, with the largest unchanged over several years. No acute symptoms or changes. No smoking history. Cough attributed to seasonal changes. - Schedule follow-up scan in July next year to monitor nodules. - Follow up after scan results are available.      Advised if symptoms do not improve or worsen, to please contact office for sooner follow up or seek emergency care.    I spent 35 minutes of dedicated to the care of this patient on  the date of this encounter to include pre-visit review of records, face-to-face time with the patient discussing conditions above, post visit ordering of testing, clinical documentation with the electronic health record, making appropriate referrals as documented, and communicating necessary findings to members of the patients care team.   C. Leita Sanders, MD Advanced Bronchoscopy PCCM [facility]    *This note was dictated using voice recognition software/Dragon.  Despite best efforts to proofread, errors can occur which can change the meaning. Any transcriptional errors that result from this process are unintentional and may not be fully corrected at the time of dictation.

## 2023-11-11 NOTE — Patient Instructions (Signed)
 VISIT SUMMARY:  Today, you were seen for an evaluation of lung nodules that were discovered during a scan in July. You have a cough that occurs with seasonal changes, but no history of asthma. You also mentioned a fall in July that resulted in fractures and required hospitalization. You have never smoked.  YOUR PLAN:  -PULMONARY NODULES: Lung nodules are small, round growths in the lung that are usually found incidentally during scans. Your nodules are small and stable, with the largest one unchanged over several years. Since you have no acute symptoms or changes, and no history of smoking, we will monitor the nodules with a follow-up scan next July. After the scan, we will review the results together.  INSTRUCTIONS:  Please schedule a follow-up scan for next July to monitor your lung nodules. After the scan, we will meet again to discuss the results.

## 2023-11-13 ENCOUNTER — Ambulatory Visit: Payer: Self-pay

## 2023-11-13 NOTE — Telephone Encounter (Signed)
 FYI Only or Action Required?: Action required by provider: clinical question for provider.  Patient was last seen in primary care on 10/16/2023 by Vicci Barnie NOVAK, MD.  Called Nurse Triage reporting Foot Swelling.  Symptoms began several days ago.  Interventions attempted: Prescription medications: hctz and Other: elevating .  Symptoms are: gradually worsening.  Triage Disposition: See PCP When Office is Open (Within 3 Days)  Patient/caregiver understands and will follow disposition?: No, wishes to speak with PCP    Copied from CRM #[id number]. Topic: Clinical - Red Word Triage >> Nov 13, 2023 11:11 AM Tiffini S wrote: [facility] that prompted transfer to Nurse Triage: Both feet are swollen, can't wear shoes and symptoms just started Reason for Disposition  MODERATE ankle swelling (e.g., interferes with normal activities, can't move joint normally) (Exceptions: Itchy, localized swelling; swelling is chronic.)  Answer Assessment - Initial Assessment Questions Additional info:  1) Taking hydrochlorothiazide  as prescribed. She is wondering is her medication dose can be increased.  2) declined office visit, would like medication increase    1. LOCATION: Which ankle is swollen? Where is the swelling?     Bilateral feet  2. ONSET: When did the swelling start?      3 days  3. SWELLING: How bad is the swelling? Or, How large is it? (e.g., mild, moderate, severe; size of localized swelling)      moderate-unable to put on shoes  4. PAIN: Is there any pain? If Yes, ask: How bad is it? (Scale 0-10; or none, mild, moderate, severe)     When she walks  5. CAUSE: What do you think caused the ankle swelling?     Need to increase medication 6. OTHER SYMPTOMS: Do you have any other symptoms? (e.g., fever, chest pain, difficulty breathing, calf pain)     Blister on calf from spilling hot grease, has an intact blister, happened one week ago. Denies all other symptoms.  Protocols  used: Ankle Swelling-A-AH

## 2023-11-13 NOTE — Telephone Encounter (Signed)
 Spoke with patient . Patient voiced that her feet are swollen tight and she is unable to put on her shoes. Voiced that about a week ago she spilled hot grease on her right foot and it has a blister on it. Advised that she should be a evaluated at the office by one of our providers, UC or ED. Patient voiced that her schedule is really tight and just wanted to know if hydrochlorothiazide  can be increase by her PCP to help with swelling in her feet. Advised that  hydrochlorothiazide  is primary given to management BP and again she should been seen by a medical profession for evaluation to ensure she was receiving to correct care. Patient voiced ok and call end.

## 2023-11-13 NOTE — Telephone Encounter (Signed)
 Let patient know that we can try changing the hydrochlorothiazide  to a different/stronger fluid pill called Furosemide  20 mg daily. This means she would stop the hydrochlorothiazide . After being on it for 1 wk, I would like for her to come to lab to have potassium and kidney function recheck. Let me know if she agrees with plan then I can send the rxn to her pharmacy. If it is easier for her to have the lab drawn at one of the [facility] locations in [place], I can set up the order whereby she would be able to go to any [facility] location in her area to have it drawn in 1 wk.

## 2023-11-14 ENCOUNTER — Encounter: Payer: Self-pay | Admitting: Pulmonary Disease

## 2023-11-17 NOTE — Telephone Encounter (Signed)
 Late entry: call to patient on 11/14/2023 unable to reach message left

## 2023-11-28 ENCOUNTER — Ambulatory Visit: Admitting: Cardiovascular Disease

## 2023-11-28 ENCOUNTER — Ambulatory Visit: Attending: Cardiovascular Disease | Admitting: Cardiovascular Disease

## 2023-11-28 ENCOUNTER — Encounter: Payer: Self-pay | Admitting: Cardiovascular Disease

## 2023-11-28 VITALS — BP 98/68 | HR 61 | Ht 65.0 in | Wt 190.0 lb

## 2023-11-28 DIAGNOSIS — I4729 Other ventricular tachycardia: Secondary | ICD-10-CM | POA: Diagnosis not present

## 2023-11-28 DIAGNOSIS — I471 Supraventricular tachycardia, unspecified: Secondary | ICD-10-CM

## 2023-11-28 DIAGNOSIS — R55 Syncope and collapse: Secondary | ICD-10-CM | POA: Diagnosis not present

## 2023-11-28 NOTE — Patient Instructions (Addendum)
 Medication Instructions:  Your physician recommends that you continue on your current medications as directed. Please refer to the Current Medication list given to you today.  *If you need a refill on your cardiac medications before your next appointment, please call your pharmacy*  Lab Work: None ordered.  If you have labs (blood work) drawn today and your tests are completely normal, you will receive your results only by: MyChart Message (if you have MyChart) OR A paper copy in the mail If you have any lab test that is abnormal or we need to change your treatment, we will call you to review the results.  Testing/Procedures: None ordered.   Follow-Up: At [facility], you and your health needs are our priority.  As part of our continuing mission to provide you with exceptional heart care, our providers are all part of one team.  This team includes your primary Cardiologist (physician) and Advanced Practice Providers or APPs (Physician Assistants and Nurse Practitioners) who all work together to provide you with the care you need, when you need it.  Your next appointment:   Follow up as needed with Dr Nancey

## 2023-11-28 NOTE — Progress Notes (Signed)
 Electrophysiology Office Note:    Date:  11/28/2023   ID:  Rachel Herman, DOB 01-13-1951, MRN [id number]  PCP:  Vicci Barnie NOVAK, MD   [facility] Providers Cardiologist:  None     Referring MD: Vicci Barnie NOVAK, MD   History of Present Illness:    Rachel Herman is a 73 y.o. female with a medical history significant for recurrent syncope, diabetes 2 with neuropathy, radiculopathy breast cancer, CKD 3 referred for possible loop recorder placement.         History of Present Illness  She experienced an episode of syncope in July 2025 resulting in a fall with pelvic fracture.  She was standing in line at the grocery store.  She does not recall any prodrome, post ictal type symptoms.  The episode was very brief.  She had a similar episode 1 year prior while working outside.  Subsequent evaluation was unrevealing.  She tells me today that she was having significant back pain carrying a large box during the most recent event.  The prior event, she tells me that she had just had back surgery and was suffering back pain at that time as well.         Today, she reports that she is doing well and has no acute complaints.  EKGs/Labs/Other Studies Reviewed Today:     Echocardiogram:  TTE August 25, 2023 LVEF 60 to 65%.  Grade 1 diastolic dysfunction.  Normal mitral valve structure and function.  Normal aortic valve structure and function.   Monitors:  14 day monitor July 2025-- my interpretation Sinus rhythm heart rate 55 to 119 bpm, average 80 bpm Multiple atrial runs noted rates 59 to 188 bpm, average 120 bpm 2 episodes of brief nonsustained VT noted. Burden of atrial ectopy less than 1%.   EKG:   EKG Interpretation Date/Time:  Friday November 28 2023 13:31:29 EDT Ventricular Rate:  60 PR Interval:  174 QRS Duration:  88 QT Interval:  422 QTC Calculation: 422 R Axis:   59  Text Interpretation: Normal sinus rhythm Low voltage QRS When compared  with ECG of 23-Oct-2023 14:03, No significant change was found Confirmed by Nancey Scotts [phone] on 11/28/2023 1:41:31 PM     Physical Exam:    VS:  BP 98/68 (BP Location: Left Arm, Patient Position: Sitting, Cuff Size: Large)   Pulse 61   Ht 5' 5 (1.651 m)   Wt 190 lb (86.2 kg)   SpO2 96%   BMI 31.62 kg/m     Wt Readings from Last 3 Encounters:  11/28/23 190 lb (86.2 kg)  11/11/23 193 lb (87.5 kg)  10/23/23 195 lb (88.5 kg)     GEN:  Well nourished, well developed, obese in no acute distress CARDIAC: RRR, no murmurs, rubs, gallops RESPIRATORY:  Normal work of breathing MUSCULOSKELETAL: no edema    ASSESSMENT & PLAN:     Recurrent syncope We discussed the indication and rationale for loop recorder placement.  I described the procedural risk including infection, minor bleeding.  I explained the monitoring process with the associated charge. She is confident that her episodes were due to back pain, and I explained that sometimes back pain can trigger arrhythmias (eg vagal-mediated pauses) She voiced understanding the recommendation for loop recorder placement but declines to undergo the procedure at this time.  I will be happy to see her again if she changes her mind and decides to proceed with loop recorder placement.  Signed, Scotts BRAVO [name],  MD  11/28/2023 1:53 PM    [facility]

## 2023-11-29 ENCOUNTER — Other Ambulatory Visit: Payer: Self-pay | Admitting: Internal Medicine

## 2023-11-29 DIAGNOSIS — E669 Obesity, unspecified: Secondary | ICD-10-CM

## 2023-11-30 ENCOUNTER — Other Ambulatory Visit: Payer: Self-pay | Admitting: Internal Medicine

## 2023-11-30 ENCOUNTER — Other Ambulatory Visit: Payer: Self-pay | Admitting: Physician Assistant

## 2023-11-30 DIAGNOSIS — E669 Obesity, unspecified: Secondary | ICD-10-CM

## 2023-12-01 NOTE — Telephone Encounter (Signed)
 Requested Prescriptions  Refused Prescriptions Disp Refills   metFORMIN  (GLUCOPHAGE ) 500 MG tablet [Pharmacy Med Name: METFORMIN  HCL 500 MG TABLET] 180 tablet 3    Sig: TAKE 1 TABLET BY MOUTH 2 TIMES DAILY WITH A MEAL.     Endocrinology:  Diabetes - Biguanides Failed - 12/01/2023  2:19 PM      Failed - Cr in normal range and within 360 days    Creatinine  Date Value Ref Range Status  05/05/2023 1.17 (H) 0.44 - 1.00 mg/dL Final  97/96/7974 854.5 20.0 - 300.0 mg/dL Final  89/94/7981 1.2 (H) 0.6 - 1.1 mg/dL Final   Creatinine, Ser  Date Value Ref Range Status  10/23/2023 1.22 (H) 0.57 - 1.00 mg/dL Final   Creatinine, POC  Date Value Ref Range Status  07/02/2016 100mg  mg/dL Final         Failed - eGFR in normal range and within 360 days    GFR calc Af Amer  Date Value Ref Range Status  10/18/2019 83 >59 mL/min/1.73 Final    Comment:    **[facility] currently reports eGFR in compliance with the current**   recommendations of the [employer]. [facility] will   update reporting as new guidelines are published from the NKF-ASN   Task force.    GFR, Estimated  Date Value Ref Range Status  08/27/2023 >60 >60 mL/min Final    Comment:    (NOTE) Calculated using the CKD-EPI Creatinine Equation (2021)   05/05/2023 49 (L) >60 mL/min Final    Comment:    (NOTE) Calculated using the CKD-EPI Creatinine Equation (2021)    eGFR  Date Value Ref Range Status  10/23/2023 47 (L) >59 mL/min/1.73 Final         Failed - B12 Level in normal range and within 720 days    No results found for: VITAMINB12       Passed - HBA1C is between 0 and 7.9 and within 180 days    HbA1c, POC (prediabetic range)  Date Value Ref Range Status  06/08/2019 5.9 5.7 - 6.4 % Final   HbA1c, POC (controlled diabetic range)  Date Value Ref Range Status  08/15/2023 5.8 0.0 - 7.0 % Final         Passed - Valid encounter within last 6 months    Recent Outpatient Visits           1 month ago  Syncope, unspecified syncope type   [facility] [place] Vicci Sober B, MD   3 months ago Type 2 diabetes mellitus with obesity   [facility] [place]. [facility] Vicci Sober B, MD   8 months ago Type 2 diabetes mellitus with obesity   [facility] [place]. [facility] Vicci Sober NOVAK, MD   1 year ago Type 2 diabetes mellitus with obesity   [facility] [place]. [facility] Vicci Sober NOVAK, MD   1 year ago Primary osteoarthritis of right hip   [facility] Of [place]. [facility], Jon M, [place]              Passed - CBC within normal limits and completed in the last 12 months    WBC  Date Value Ref Range Status  08/27/2023 9.1  4.0 - 10.5 K/uL Final   RBC  Date Value Ref Range Status  08/27/2023 3.85 (L) 3.87 - 5.11 MIL/uL Final   Hemoglobin  Date Value Ref Range Status  10/23/2023 12.0 11.1 - 15.9 g/dL Final   HGB  Date Value Ref Range Status  11/08/2016 14.5 11.6 - 15.9 g/dL Final   HCT  Date Value Ref Range Status  11/08/2016 44.2 34.8 - 46.6 % Final   Hematocrit  Date Value Ref Range Status  10/23/2023 37.8 34.0 - 46.6 % Final   MCHC  Date Value Ref Range Status  08/27/2023 32.2 30.0 - 36.0 g/dL Final   [facility]  Date Value Ref Range Status  08/27/2023 29.6 26.0 - 34.0 pg Final   MCV  Date Value Ref Range Status  08/27/2023 91.9 80.0 - 100.0 fL Final  06/25/2021 86 79 - 97 fL Final  11/08/2016 85.3 79.5 - 101.0 fL Final   No results found for: PLTCOUNTKUC, LABPLAT, POCPLA RDW  Date Value Ref Range Status  08/27/2023 14.1 11.5 - 15.5 % Final  06/25/2021 14.3 11.7 - 15.4 % Final  11/08/2016 15.1 (H) 11.2 - 14.5 % Final

## 2023-12-02 NOTE — Telephone Encounter (Signed)
 Requested Prescriptions  Pending Prescriptions Disp Refills   Blood Glucose Monitoring Suppl (ACCU-CHEK GUIDE ME) w/Device KIT [Pharmacy Med Name: ACCU-CHEK GUIDE ME GLUCOSE MTR] 1 kit 0    Sig: USE AS DIRECTED TO CHECK BLOOD SUGAR ONCE A DAY     Endocrinology: Diabetes - Testing Supplies Passed - 12/02/2023 12:21 PM      Passed - Valid encounter within last 12 months    Recent Outpatient Visits           1 month ago Syncope, unspecified syncope type   [facility] - [facility] Dept Of [place]. [facility] Vicci Sober B, MD   3 months ago Type 2 diabetes mellitus with obesity   [place] [facility] [place]. [facility] Vicci Sober B, MD   8 months ago Type 2 diabetes mellitus with obesity   [facility] Of [place]. [facility] Vicci Sober NOVAK, MD   1 year ago Type 2 diabetes mellitus with obesity   [place] [facility] - [facility] Dept Of [place]. [facility] Vicci Sober NOVAK, MD   1 year ago Primary osteoarthritis of right hip   [place] [facility] - [facility] Dept Of [place]. [facility] [place], [place], PA-C

## 2023-12-08 ENCOUNTER — Encounter: Payer: Self-pay | Admitting: Radiology

## 2023-12-15 ENCOUNTER — Other Ambulatory Visit: Payer: Self-pay | Admitting: Internal Medicine

## 2023-12-15 DIAGNOSIS — G8929 Other chronic pain: Secondary | ICD-10-CM

## 2023-12-16 ENCOUNTER — Ambulatory Visit: Admitting: Internal Medicine

## 2024-01-07 ENCOUNTER — Other Ambulatory Visit

## 2024-01-20 ENCOUNTER — Encounter: Payer: Self-pay | Admitting: *Deleted

## 2024-01-20 NOTE — Progress Notes (Signed)
 Rachel Herman                                          MRN: [id number]   01/20/2024   The VBCI Quality Team Specialist reviewed this patient medical record for the purposes of chart review for care gap closure. The following were reviewed: chart review for care gap closure-osteoporosis management in women fracture.    VBCI Quality Team

## 2024-02-17 ENCOUNTER — Encounter: Payer: Self-pay | Admitting: Internal Medicine

## 2024-02-17 ENCOUNTER — Ambulatory Visit: Attending: Internal Medicine | Admitting: Internal Medicine

## 2024-02-17 VITALS — BP 101/67 | HR 72 | Temp 97.7°F | Ht 65.0 in | Wt 192.0 lb

## 2024-02-17 DIAGNOSIS — G8929 Other chronic pain: Secondary | ICD-10-CM

## 2024-02-17 DIAGNOSIS — Y92481 Parking lot as the place of occurrence of the external cause: Secondary | ICD-10-CM | POA: Diagnosis not present

## 2024-02-17 DIAGNOSIS — Z6831 Body mass index (BMI) 31.0-31.9, adult: Secondary | ICD-10-CM | POA: Diagnosis not present

## 2024-02-17 DIAGNOSIS — I152 Hypertension secondary to endocrine disorders: Secondary | ICD-10-CM

## 2024-02-17 DIAGNOSIS — Z7984 Long term (current) use of oral hypoglycemic drugs: Secondary | ICD-10-CM

## 2024-02-17 DIAGNOSIS — E669 Obesity, unspecified: Secondary | ICD-10-CM | POA: Diagnosis not present

## 2024-02-17 DIAGNOSIS — I10 Essential (primary) hypertension: Secondary | ICD-10-CM | POA: Diagnosis not present

## 2024-02-17 DIAGNOSIS — R918 Other nonspecific abnormal finding of lung field: Secondary | ICD-10-CM

## 2024-02-17 DIAGNOSIS — E785 Hyperlipidemia, unspecified: Secondary | ICD-10-CM | POA: Diagnosis not present

## 2024-02-17 DIAGNOSIS — I471 Supraventricular tachycardia, unspecified: Secondary | ICD-10-CM

## 2024-02-17 DIAGNOSIS — E1159 Type 2 diabetes mellitus with other circulatory complications: Secondary | ICD-10-CM

## 2024-02-17 DIAGNOSIS — Z1211 Encounter for screening for malignant neoplasm of colon: Secondary | ICD-10-CM

## 2024-02-17 DIAGNOSIS — M5416 Radiculopathy, lumbar region: Secondary | ICD-10-CM | POA: Diagnosis not present

## 2024-02-17 DIAGNOSIS — W19XXXA Unspecified fall, initial encounter: Secondary | ICD-10-CM

## 2024-02-17 DIAGNOSIS — E1169 Type 2 diabetes mellitus with other specified complication: Secondary | ICD-10-CM

## 2024-02-17 DIAGNOSIS — E119 Type 2 diabetes mellitus without complications: Secondary | ICD-10-CM

## 2024-02-17 LAB — POCT GLYCOSYLATED HEMOGLOBIN (HGB A1C): HbA1c, POC (controlled diabetic range): 6.1 % (ref 0.0–7.0)

## 2024-02-17 LAB — GLUCOSE, POCT (MANUAL RESULT ENTRY): POC Glucose: 117 mg/dL — AB (ref 70–99)

## 2024-02-17 MED ORDER — TRAMADOL HCL 50 MG PO TABS: ORAL_TABLET | ORAL | 1 refills | Status: AC

## 2024-02-17 NOTE — Progress Notes (Signed)
 "   Patient ID: Rachel Herman, female    DOB: 10-09-1950  MRN: [id number]  CC: Diabetes (DM f/u./Reports fall in the parking lot/On-going pain on lower RT back/Already received flu vax.)   Subjective: Rachel Herman is a 74 y.o. female who presents for chronic ds management. Kelby Lynwood Hussar, is with her.  Her chronic medical issues include:  Pt with hx of HTN, DM, HL, DJD lumbar and spinal stenosis with hx of laminectomy 2016, obesity, depression, Ductal  CA LT s/p lumpectomy/XRT (2015 and 2016, followed by Dr. Ileana), vaginal prolapse, CKD 3, RT lung nodule (CTA 10/2018 - stable). Thyroid  nodule bx 2017 benign  Discussed the use of AI scribe software for clinical note transcription with the patient, who gave verbal consent to proceed.  History of Present Illness Rachel Herman is a 74 year old female who presents for chronic disease management and follow-up after a recent fall.  She experienced a fall today in the parking lot outside the clinic while using her cane. She attempted to step onto the sidewalk and fell, landing on her buttocks. She was helped up by others who were near by and a [facility] employee who she thinks was a engineer, materials. Her fianc was parking the car at the time.  Patient was subsequently placed in a wheelchair.  Denies any pain in her tailbone at this time, but she continues to experience her usual chronic pain in the lower back radiating down the right leg to her toes.  She has a history of chronic pain, primarily in the lower back and right hip, for which she has received injections in past. The last injection was administered in August 2025 by Dr. Addie to the RT hip, providing temporary relief.  Her fianc reports that she experiences difficulty sleeping due to pain, waking every two hours, and currently takes tramadol  and Tylenol  for pain management.  He feels tramadol  is not very effective on its own.  He is advocating for her to be prescribed trazodone  to help with sleep stating that this is what he takes and it works well for him. - Patient herself reports that she takes Tramadol  with a Tylenol  which is helpful but feels she would benefit from having stronger pain med especially at nights  Monitor that was worn after syncopal episode in July revealed 2 short runs of NSVT and 106 PSVT and rare PACs/PVC per cardiology note. Loop recorder was recommended. She has declined the placement of a loop recorder due to concerns about possible complications.  Her diabetes is well-controlled with an A1c of 6.1. She takes metformin  500 mg 1.5 tabs twice a day and checks her blood sugar sporadically, reporting good control.   HTN/HL: She is also on hydrochlorothiazide , carvedilol , clonidine , and atorvastatin  for hypertension and hyperlipidemia, with noted improvement in leg swelling from last visit  She has a history of lung nodules, which were evaluated by a pulmonologist Dr. Tamea.  Assessment was that nodules are small, stable with the largest unchanged over several years.  No acute symptoms or changes and no history of smoking. A follow-up scan is planned for July 2026.   HM: due for repeat c-scope. Her last colonoscopy was in August 2020, with a recommendation for a repeat in five years.    Patient Active Problem List   Diagnosis Date Noted   Family history of malignant neoplasm of colon 11/11/2023   Gastro-esophageal reflux disease without esophagitis 11/11/2023   History of colonic polyps 11/11/2023  Well controlled type 2 diabetes mellitus (HCC) 11/11/2023   Syncope 08/25/2023   Pelvic fracture (HCC) 08/25/2023   Closed fracture of multiple pubic rami (HCC) 08/25/2023   Stage 3a chronic kidney disease (HCC) 03/01/2022   Pneumococcal vaccination declined 10/18/2019   Chronic cough 08/03/2018   Other intervertebral disc degeneration, lumbar region 03/18/2018   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   Abdominal distension 10/23/2016    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/21/2016   Lumbar pain with radiation down left and right leg 07/02/2016   Thyroid  nodule 06/09/2015   Nodule of right lung 07/28/2014   Diverticulosis of colon without hemorrhage 07/28/2014   DJD (degenerative joint disease), lumbar 02/22/2014   Lumbar pain with radiation down left leg 01/17/2014   Breast cancer of upper-inner quadrant of left female breast (HCC) 01/05/2014   Hyperlipidemia associated with type 2 diabetes mellitus (HCC) 09/28/2013   S/P total hysterectomy and bilateral salpingo-oophorectomy 09/20/2013   Essential hypertension    Diabetes mellitus (HCC) 09/16/2013     Medications Ordered Prior to Encounter[1]  Allergies[2]  Social History   Socioeconomic History   Marital status: Married    Spouse name: Antonietta Lansdowne    Number of children: 3   Years of education: 12   Highest education level: Not on file  Occupational History    Employer: UNEMPLOYED  Tobacco Use   Smoking status: Never   Smokeless tobacco: Never  Vaping Use   Vaping status: Never Used  Substance and Sexual Activity   Alcohol use: No   Drug use: No   Sexual activity: Not Currently    Birth control/protection: Post-menopausal  Other Topics Concern   Not on file  Social History Narrative   Married to [employer] in 1986.    Has 3 children from previous marriage, 1 in [place], 1 in [place], 1 in prison ([place]).    Lives with husband.    Right-handed.   1 cup caffeine per day.   Social Drivers of Health   Tobacco Use: Low Risk (11/28/2023)   Patient History    Smoking Tobacco Use: Never    Smokeless Tobacco Use: Never    Passive Exposure: Not on file  Financial Resource Strain: High Risk (04/29/2023)   Overall Financial Resource Strain (CARDIA)    Difficulty of Paying Living Expenses: Very hard  Food Insecurity: No Food Insecurity (08/26/2023)   Epic    Worried About Radiation Protection Practitioner of Food in the Last Year: Never true     Ran Out of Food in the Last Year: Never true  Transportation Needs: No Transportation Needs (08/26/2023)   Epic    Lack of Transportation (Medical): No    Lack of Transportation (Non-Medical): No  Physical Activity: Sufficiently Active (04/29/2023)   Exercise Vital Sign    Days of Exercise per Week: 5 days    Minutes of Exercise per Session: 30 min  Stress: Stress Concern Present (04/29/2023)   [employer] of Occupational Health - Occupational Stress Questionnaire    Feeling of Stress : To some extent  Social Connections: Moderately Isolated (08/26/2023)   Social Connection and Isolation Panel    Frequency of Communication with Friends and Family: Never    Frequency of Social Gatherings with Friends and Family: Never    Attends Religious Services: 1 to 4 times per year    Active Member of [employer] or Organizations: No    Attends Banker Meetings: Never    Marital Status: Living with partner  Intimate Partner Violence: Not At Risk (08/26/2023)   Epic    Fear of Current or Ex-Partner: No    Emotionally Abused: No    Physically Abused: No    Sexually Abused: No  Depression (PHQ2-9): Low Risk (10/16/2023)   Depression (PHQ2-9)    PHQ-2 Score: 1  Alcohol Screen: Low Risk (04/29/2023)   Alcohol Screen    Last Alcohol Screening Score (AUDIT): 0  Housing: Low Risk (08/26/2023)   Epic    Unable to Pay for Housing in the Last Year: No    Number of Times Moved in the Last Year: 0    Homeless in the Last Year: No  Utilities: Not At Risk (08/26/2023)   Epic    Threatened with loss of utilities: No  Health Literacy: Adequate Health Literacy (04/29/2023)   B1300 Health Literacy    Frequency of need for help with medical instructions: Never    Family History  Problem Relation Age of Onset   Diabetes Mother    Multiple myeloma Mother 35   Hearing loss Mother    Diabetes Brother    Cancer Brother 26       prostate cancer    Diabetes Maternal Aunt    Leukemia Maternal Aunt         dx in her 67s   Alcoholism Brother    Heart attack Father    Diabetes Sister    Diabetes Son     Past Surgical History:  Procedure Laterality Date   ABDOMINAL HYSTERECTOMY N/A 09/20/2013   Procedure: HYSTERECTOMY ABDOMINAL;  Surgeon: Gloris DELENA Hugger, MD;  Location: [facility];  Service: Gynecology;  Laterality: N/A;   BREAST LUMPECTOMY Left 2015   radiation   BREAST LUMPECTOMY WITH AXILLARY LYMPH NODE BIOPSY Left 12/29/13   left breast    CATARACT EXTRACTION Right    LAPAROSCOPIC ASSISTED VAGINAL HYSTERECTOMY  09/20/2013   Procedure: LAPAROSCOPIC ASSISTED VAGINAL HYSTERECTOMY;  Surgeon: Gloris DELENA Hugger, MD;  Location: [facility];  Service: Gynecology;;  PT WAS EXAMINED WHILE UP IN [place] AND IT WAS DECIDED TO OPEN PT DUE TO LARGE MASS   LUMBAR LAMINECTOMY/DECOMPRESSION MICRODISCECTOMY Right 12/14/2014   Procedure: Right Lumbar three-four microdiskectomy;  Surgeon: Reyes Budge, MD;  Location: [facility];  Service: Neurosurgery;  Laterality: Right;  Right Lumbar three-four microdiskectomy   RADIOACTIVE SEED GUIDED PARTIAL MASTECTOMY WITH AXILLARY SENTINEL LYMPH NODE BIOPSY Left 12/29/2013   Procedure: LEFT BREAST RADIOACTIVE SEED LOCALIZED LUMPECTOMY WITH SENTINEL LYMPH NODE MAPPING;  Surgeon: Debby Shipper, MD;  Location: [facility];  Service: General;  Laterality: Left;   RE-EXCISION OF BREAST LUMPECTOMY Left 03/02/2014   Procedure: RE-EXCISION OF LEFT BREAST LUMPECTOMY;  Surgeon: Debby Shipper, MD;  Location: [facility];  Service: General;  Laterality: Left;   SALPINGOOPHORECTOMY  09/20/2013   Procedure: SALPINGO OOPHORECTOMY;  Surgeon: Gloris DELENA Hugger, MD;  Location: [facility];  Service: Gynecology;;    ROS: Review of Systems Negative except as stated above  PHYSICAL EXAM: BP 101/67 (BP Location: Left Arm, Patient Position: Sitting, Cuff Size: Normal)   Pulse 72   Temp 97.7 F (36.5 C) (Oral)   Ht 5' 5 (1.651 m)   Wt 192 lb (87.1 kg)   SpO2  96%   BMI 31.95 kg/m   Wt Readings from Last 3 Encounters:  02/17/24 192 lb (87.1 kg)  11/28/23 190 lb (86.2 kg)  11/11/23 193 lb (87.5 kg)    Physical Exam  General appearance - alert, well appearing,  elderly female sitting in [facility]  and in no distress.  Patient's fianc intermittently interjects about his own medical issues for which I always redirect the conversation back to the patient. Mental status - normal mood, behavior, speech, dress, motor activity, and thought processes Mouth: oral mucosa moist Chest - clear to auscultation, no wheezes, rales or rhonchi, symmetric air entry Heart - normal rate, regular rhythm, normal S1, S2, no murmurs, rubs, clicks or gallops Neurological/MSK: Patient sitting in a wheelchair.  Power lower extremities 5/5 proximally and distally on the left.  Right: 5/5 distally, 4+/5 proximally.  Patient reports increased sensitivity on the inner aspect of the right lower leg when gross sensation was checked.  She has her cane with her. Lower back: Slight tenderness on palpation of the paraspinal muscles in the right lumbar region. Extremities -trace lower extremity edema.  Positive varicose veins in the lower legs right greater than left.      Latest Ref Rng & Units 10/23/2023    3:01 PM 08/27/2023    5:41 AM 08/26/2023    4:09 AM  CMP  Glucose 70 - 99 mg/dL 97  881  99   BUN 8 - 27 mg/dL 28  18  15    Creatinine 0.57 - 1.00 mg/dL 8.77  9.10  9.16   Sodium 134 - 144 mmol/L 144  138  140   Potassium 3.5 - 5.2 mmol/L 4.1  3.7  3.4   Chloride 96 - 106 mmol/L 103  103  101   CO2 20 - 29 mmol/L 22  24  28    Calcium  8.7 - 10.3 mg/dL 9.6  9.0  9.2    Lipid Panel     Component Value Date/Time   CHOL 135 07/03/2022 0922   TRIG 236 (H) 07/03/2022 0922   HDL 40 07/03/2022 0922   CHOLHDL 3.4 07/03/2022 0922   CHOLHDL 5.9 09/27/2013 1053   VLDL 48 (H) 09/27/2013 1053   LDLCALC 57 07/03/2022 0922    CBC    Component Value Date/Time   WBC 9.1 08/27/2023 0541    RBC 3.85 (L) 08/27/2023 0541   HGB 12.0 10/23/2023 1501   HGB 14.5 11/08/2016 0915   HCT 37.8 10/23/2023 1501   HCT 44.2 11/08/2016 0915   PLT 255 08/27/2023 0541   PLT 264 05/05/2023 1059   PLT 307 06/25/2021 1154   MCV 91.9 08/27/2023 0541   MCV 86 06/25/2021 1154   MCV 85.3 11/08/2016 0915   MCH 29.6 08/27/2023 0541   MCHC 32.2 08/27/2023 0541   RDW 14.1 08/27/2023 0541   RDW 14.3 06/25/2021 1154   RDW 15.1 (H) 11/08/2016 0915   LYMPHSABS 2.4 08/27/2023 0541   LYMPHSABS 1.7 11/08/2016 0915   MONOABS 0.7 08/27/2023 0541   MONOABS 0.7 11/08/2016 0915   EOSABS 0.3 08/27/2023 0541   EOSABS 0.1 11/08/2016 0915   BASOSABS 0.0 08/27/2023 0541   BASOSABS 0.0 11/08/2016 0915    ASSESSMENT AND PLAN: 1. Fall, initial encounter Patient reports having fallen in a parking lot when she attempted to step onto the curb.  She had her cane with her.  Fell on her buttock.  She denies any pain in the buttock/tailbone pain at this time.  States that the only pain she has is her usual pain across the lower back that radiates into the right leg that is unchanged. - Patient has a walker at home.  I recommend using that when she is leaving the house as it may provide more stability  than the cane.  Patient's fianc stated that he had recommended the same when they were leaving the house today but patient declined taking her walker. - I suggested referral to physical therapy for gait safety training but patient declines.  2. Lumbar radiculopathy, chronic Patient with chronic lumbar radiculopathy with history of laminectomy in 2016 and for which she had received injections in the past I think by Dr. Eldonna.  She has been on controlled substance prescribing agreement with me for several years for tramadol .  She feels she may need something stronger at this time.  I will refer her to [facility] for eval and management.  In the meantime, she will continue the tramadol  with Tylenol .  She is due for  updated controlled substance prescribing agreement so we went over that with her today and patient agreed and signed the document.  She was agreeable to urine drug screen today as well.  Last took tramadol  last evening.  [place]  controlled substance reporting system reviewed. - Ambulatory referral to Pain Clinic - traMADol  (ULTRAM ) 50 MG tablet; TAKE 1 TABLET EVERY 4 HOURS AS NEEDED (EACH PRESCRIPTION TO LAST 1 MONTH).  Dispense: 120 tablet; Refill: 1  3. Type 2 diabetes mellitus in patient with obesity (HCC) (Primary) At goal.  Continue metformin  and healthy eating habits. - POCT glucose (manual entry) - POCT glycosylated hemoglobin (Hb A1C) - Basic Metabolic Panel - Microalbumin / creatinine urine ratio - CBC  4. Diabetes mellitus treated with oral medication (HCC) See #3 above.  5. Hypertension associated with diabetes (HCC) At goal.  Continue HCTZ 25 mg daily, carvedilol  12.5 mg twice a day, clonidine  0.1 mg twice a day  6. Hyperlipidemia associated with type 2 diabetes mellitus (HCC) Continue atorvastatin  20 mg daily - Lipid panel  7. Screening for colon cancer Referral submitted to Dr. Josepha for repeat c-scope  8. Encounter for chronic pain management - Drug Screen, Urine  9. PSVT (paroxysmal supraventricular tachycardia) Trazodone would not be a good option in this patient as it can cause arrhythmias  10. Multiple lung nodules Seen by pulmonary.  Plan is for repeat CAT scan in 1 year.      Patient was given the opportunity to ask questions.  Patient verbalized understanding of the plan and was able to repeat key elements of the plan.   This documentation was completed using Paediatric nurse.  Any transcriptional errors are unintentional.  Orders Placed This Encounter  Procedures   Lipid panel   Basic Metabolic Panel   Microalbumin / creatinine urine ratio   CBC   Ambulatory referral to Pain Clinic   POCT glucose (manual entry)   POCT  glycosylated hemoglobin (Hb A1C)   Drug Screen, Urine     Requested Prescriptions   Signed Prescriptions Disp Refills   traMADol  (ULTRAM ) 50 MG tablet 120 tablet 1    Sig: TAKE 1 TABLET EVERY 4 HOURS AS NEEDED (EACH PRESCRIPTION TO LAST 1 MONTH).    Return in about 4 months (around 06/16/2024).  Barnie Louder, MD, FACP     [1]  Current Outpatient Medications on File Prior to Visit  Medication Sig Dispense Refill   Accu-Chek Softclix Lancets lancets Use as instructed 100 each 12   acetaminophen  (TYLENOL ) 500 MG tablet Take 2 tablets (1,000 mg total) by mouth 3 (three) times daily. 30 tablet 0   albuterol  (VENTOLIN  HFA) 108 (90 Base) MCG/ACT inhaler Inhale 2 puffs into the lungs every 6 (six) hours as needed for  wheezing or shortness of breath. 8 g 3   aspirin EC 325 MG tablet Take 325 mg by mouth daily.     atorvastatin  (LIPITOR) 20 MG tablet TAKE 1 TABLET BY MOUTH DAILY. STOP PRAVASTATIN  90 tablet 1   Blood Glucose Monitoring Suppl (ACCU-CHEK GUIDE ME) w/Device KIT USE AS DIRECTED TO CHECK BLOOD SUGAR ONCE A DAY 1 kit 0   carvedilol  (COREG ) 12.5 MG tablet Take 1 tablet (12.5 mg total) by mouth 2 (two) times daily with a meal. 180 tablet 2   cloNIDine  (CATAPRES ) 0.1 MG tablet Take 0.1 mg by mouth 2 (two) times daily.     gabapentin  (NEURONTIN ) 300 MG capsule Take 2 capsules (600 mg total) by mouth daily. 30 capsule 0   gabapentin  (NEURONTIN ) 300 MG capsule Take 3 capsules (900 mg total) by mouth at bedtime. 30 capsule 0   glucose blood (ACCU-CHEK GUIDE TEST) test strip Use as instructed to check BS once a day 100 each 12   hydrochlorothiazide  (HYDRODIURIL ) 25 MG tablet Take 25 mg by mouth daily.     magnesium  oxide (MAG-OX) 400 MG tablet Take 1 tablet (400 mg total) by mouth daily. 180 tablet 32   metFORMIN  (GLUCOPHAGE ) 500 MG tablet TAKE 1 & 1/2 TABLETS (750 MG) BY MOUTH 2 TIMES DAILY. 270 tablet 2   Multiple Vitamins-Minerals (MULTIVITAMIN WITH MINERALS) tablet Take 1 tablet by  mouth daily. Reported on 03/13/2015     omeprazole  (PRILOSEC) 20 MG capsule Take 1 capsule (20 mg total) by mouth 2 (two) times daily before a meal. 180 capsule 1   senna (SENOKOT) 8.6 MG TABS tablet Take 1 tablet (8.6 mg total) by mouth daily. 120 tablet 0   No current facility-administered medications on file prior to visit.  [2] No Known Allergies  "

## 2024-02-17 NOTE — Patient Instructions (Signed)
" °  VISIT SUMMARY: During your visit today, we discussed the management of your chronic conditions and addressed your recent fall. We reviewed your pain management, diabetes, hypertension, hyperlipidemia, pulmonary nodules, and supraventricular tachycardia. We also discussed general health maintenance and ordered necessary tests.  YOUR PLAN: -CHRONIC LUMBAR RADICULOPATHY WITH PAIN: This condition involves pain that radiates from your lower back down to your right leg. We have referred you to the [facility] pain management clinic for further evaluation. Continue taking tramadol  and Tylenol  for pain relief. We updated your controlled substance prescribing agreement for tramadol  and performed a urine drug screen.  -TYPE 2 DIABETES MELLITUS: This is a condition where your body does not use insulin  properly, leading to high blood sugar levels. Your diabetes is well-controlled with an A1c of 6.1. Continue taking metformin  and insulin , and check your blood sugars two to three times a week.  -HYPERTENSION: This is high blood pressure. Your condition is managed with hydrochlorothiazide , carvedilol , and clonidine , and there has been a recent improvement in your leg swelling. Continue with your current medications.  -HYPERLIPIDEMIA: This is a condition of having high levels of fats (lipids) in your blood. You are taking atorvastatin  to manage this condition. We have ordered a cholesterol test to monitor your levels.  -PULMONARY NODULES: These are small growths in the lungs. Your nodules have been stable, and we will repeat the scan in July 2026 to monitor them.  -SUPRAVENTRICULAR TACHYCARDIA: This is a condition where your heart beats very fast due to improper electrical activity. We will continue to monitor your symptoms, and you have declined the placement of a loop recorder.  -GENERAL HEALTH MAINTENANCE: We have ordered tests for your cholesterol and kidney function, and requested a urine sample for microalbumin.  You are also due for a colonoscopy in 2025, and we have submitted a referral for this procedure.  INSTRUCTIONS: Please follow up with the [facility] pain management clinic as referred. Continue your current medications for diabetes, hypertension, and hyperlipidemia. Check your blood sugars two to three times a week. Complete the ordered tests for cholesterol, kidney function, and urine microalbumin. We will repeat the scan for your lung nodules in July 2026. A referral for your colonoscopy has been submitted, and it is due in 2025.                      Contains text generated by [employer].                                 Contains text generated by [employer]bridge.   "

## 2024-02-18 ENCOUNTER — Ambulatory Visit: Payer: Self-pay | Admitting: Internal Medicine

## 2024-02-18 LAB — BASIC METABOLIC PANEL WITH GFR
BUN/Creatinine Ratio: 22 (ref 12–28)
BUN: 24 mg/dL (ref 8–27)
CO2: 24 mmol/L (ref 20–29)
Calcium: 9.8 mg/dL (ref 8.7–10.3)
Chloride: 102 mmol/L (ref 96–106)
Creatinine, Ser: 1.09 mg/dL — ABNORMAL HIGH (ref 0.57–1.00)
Glucose: 96 mg/dL (ref 70–99)
Potassium: 4.6 mmol/L (ref 3.5–5.2)
Sodium: 143 mmol/L (ref 134–144)
eGFR: 53 mL/min/1.73 — ABNORMAL LOW

## 2024-02-18 LAB — LIPID PANEL
Chol/HDL Ratio: 3.6 ratio (ref 0.0–4.4)
Cholesterol, Total: 153 mg/dL (ref 100–199)
HDL: 42 mg/dL
LDL Chol Calc (NIH): 70 mg/dL (ref 0–99)
Triglycerides: 251 mg/dL — ABNORMAL HIGH (ref 0–149)
VLDL Cholesterol Cal: 41 mg/dL — ABNORMAL HIGH (ref 5–40)

## 2024-02-18 LAB — MICROALBUMIN / CREATININE URINE RATIO
Creatinine, Urine: 138.1 mg/dL
Microalb/Creat Ratio: 28 mg/g{creat} (ref 0–29)
Microalbumin, Urine: 39 ug/mL

## 2024-02-18 LAB — CBC
Hematocrit: 41.7 % (ref 34.0–46.6)
Hemoglobin: 13 g/dL (ref 11.1–15.9)
MCH: 28.3 pg (ref 26.6–33.0)
MCHC: 31.2 g/dL — ABNORMAL LOW (ref 31.5–35.7)
MCV: 91 fL (ref 79–97)
Platelets: 294 x10E3/uL (ref 150–450)
RBC: 4.59 x10E6/uL (ref 3.77–5.28)
RDW: 13.1 % (ref 11.7–15.4)
WBC: 7.6 x10E3/uL (ref 3.4–10.8)

## 2024-02-18 NOTE — Addendum Note (Signed)
 Addended by: VICCI SOBER B on: 02/18/2024 10:12 AM   Modules accepted: Orders

## 2024-02-22 LAB — SPECIMEN STATUS REPORT

## 2024-02-22 LAB — DRUG SCREEN 764883 11+OXYCO+ALC+CRT-BUND
Amphetamines, Urine: NEGATIVE ng/mL
BENZODIAZ UR QL: NEGATIVE ng/mL
Barbiturate screen, urine: NEGATIVE ng/mL
Cannabinoid Quant, Ur: NEGATIVE ng/mL
Cocaine (Metab.): NEGATIVE ng/mL
Creatinine, Urine: 134.1 mg/dL (ref 20.0–300.0)
Ethanol, Urine: NEGATIVE %
Meperidine: NEGATIVE ng/mL
Methadone Screen, Urine: NEGATIVE ng/mL
Nitrite Urine, Quantitative: NEGATIVE ug/mL
OPIATE SCREEN URINE: NEGATIVE ng/mL
Oxycodone/Oxymorphone, Urine: NEGATIVE ng/mL
PCP Quant, Ur: NEGATIVE ng/mL
Propoxyphene: NEGATIVE ng/mL
pH, Urine: 5.4 (ref 4.5–8.9)

## 2024-02-22 LAB — TRAMADOL GC/MS, URINE
Tramadol gc/ms Conf: >10000 ng/mL
Tramadol: POSITIVE — AB

## 2024-02-25 ENCOUNTER — Other Ambulatory Visit: Payer: Self-pay | Admitting: Internal Medicine

## 2024-02-25 DIAGNOSIS — E1159 Type 2 diabetes mellitus with other circulatory complications: Secondary | ICD-10-CM

## 2024-02-25 DIAGNOSIS — K219 Gastro-esophageal reflux disease without esophagitis: Secondary | ICD-10-CM

## 2024-05-04 ENCOUNTER — Inpatient Hospital Stay: Admitting: Nurse Practitioner

## 2024-05-04 ENCOUNTER — Inpatient Hospital Stay

## 2024-05-04 ENCOUNTER — Ambulatory Visit

## 2024-06-17 ENCOUNTER — Ambulatory Visit: Payer: Self-pay | Admitting: Internal Medicine
